# Patient Record
Sex: Female | Born: 1945
Health system: Southern US, Community
[De-identification: ages and names within clinical notes are randomized; demographics above are authoritative.]

## PROBLEM LIST (undated history)

## (undated) DIAGNOSIS — K219 Gastro-esophageal reflux disease without esophagitis: Secondary | ICD-10-CM

## (undated) DIAGNOSIS — G20 Parkinson's disease: Secondary | ICD-10-CM

## (undated) DIAGNOSIS — I252 Old myocardial infarction: Secondary | ICD-10-CM

## (undated) DIAGNOSIS — J45909 Unspecified asthma, uncomplicated: Secondary | ICD-10-CM

## (undated) DIAGNOSIS — I10 Essential (primary) hypertension: Secondary | ICD-10-CM

## (undated) DIAGNOSIS — M1990 Unspecified osteoarthritis, unspecified site: Secondary | ICD-10-CM

## (undated) DIAGNOSIS — S72009A Fracture of unspecified part of neck of unspecified femur, initial encounter for closed fracture: Secondary | ICD-10-CM

## (undated) DIAGNOSIS — J302 Other seasonal allergic rhinitis: Secondary | ICD-10-CM

## (undated) DIAGNOSIS — Z8619 Personal history of other infectious and parasitic diseases: Secondary | ICD-10-CM

## (undated) DIAGNOSIS — J439 Emphysema, unspecified: Secondary | ICD-10-CM

## (undated) DIAGNOSIS — Z8673 Personal history of transient ischemic attack (TIA), and cerebral infarction without residual deficits: Secondary | ICD-10-CM

## (undated) DIAGNOSIS — R6521 Severe sepsis with septic shock: Secondary | ICD-10-CM

## (undated) DIAGNOSIS — N390 Urinary tract infection, site not specified: Secondary | ICD-10-CM

## (undated) DIAGNOSIS — A419 Sepsis, unspecified organism: Secondary | ICD-10-CM

## (undated) DIAGNOSIS — K5090 Crohn's disease, unspecified, without complications: Secondary | ICD-10-CM

## (undated) DIAGNOSIS — E7800 Pure hypercholesterolemia, unspecified: Secondary | ICD-10-CM

## (undated) DIAGNOSIS — J189 Pneumonia, unspecified organism: Secondary | ICD-10-CM

## (undated) DIAGNOSIS — G20A1 Parkinson's disease without dyskinesia, without mention of fluctuations: Secondary | ICD-10-CM

## (undated) HISTORY — DX: Personal history of other infectious and parasitic diseases: Z86.19

## (undated) HISTORY — DX: Urinary tract infection, site not specified: N39.0

## (undated) HISTORY — PX: CATARACT EXTRACTION, BILATERAL: SHX1313

## (undated) HISTORY — DX: Crohn's disease, unspecified, without complications: K50.90

## (undated) HISTORY — PX: WRIST SURGERY: SHX841

## (undated) HISTORY — DX: Severe sepsis with septic shock: R65.21

## (undated) HISTORY — DX: Fracture of unspecified part of neck of unspecified femur, initial encounter for closed fracture: S72.009A

## (undated) HISTORY — DX: Gastro-esophageal reflux disease without esophagitis: K21.9

## (undated) HISTORY — DX: Essential (primary) hypertension: I10

## (undated) HISTORY — DX: Sepsis, unspecified organism: A41.9

## (undated) HISTORY — PX: BREAST BIOPSY: SHX20

## (undated) HISTORY — PX: ELBOW SURGERY: SHX618

## (undated) HISTORY — DX: Unspecified osteoarthritis, unspecified site: M19.90

## (undated) HISTORY — DX: Old myocardial infarction: I25.2

## (undated) HISTORY — DX: Unspecified asthma, uncomplicated: J45.909

## (undated) HISTORY — DX: Other seasonal allergic rhinitis: J30.2

## (undated) HISTORY — PX: OTHER SURGICAL HISTORY: SHX169

## (undated) HISTORY — DX: Emphysema, unspecified: J43.9

## (undated) HISTORY — DX: Personal history of transient ischemic attack (TIA), and cerebral infarction without residual deficits: Z86.73

## (undated) HISTORY — PX: TUBAL LIGATION: SHX77

## (undated) HISTORY — DX: Pure hypercholesterolemia, unspecified: E78.00

---

## 2000-02-19 ENCOUNTER — Ambulatory Visit (HOSPITAL_COMMUNITY): Admission: RE | Admit: 2000-02-19 | Discharge: 2000-02-19 | Payer: Self-pay | Admitting: Gastroenterology

## 2005-08-28 ENCOUNTER — Ambulatory Visit: Payer: Self-pay | Admitting: Family Medicine

## 2005-11-18 ENCOUNTER — Other Ambulatory Visit: Payer: Self-pay

## 2005-11-18 ENCOUNTER — Inpatient Hospital Stay: Payer: Self-pay

## 2005-11-25 ENCOUNTER — Encounter: Payer: Self-pay | Admitting: Family Medicine

## 2005-12-02 ENCOUNTER — Ambulatory Visit: Payer: Self-pay

## 2005-12-17 ENCOUNTER — Encounter: Payer: Self-pay | Admitting: Family Medicine

## 2006-01-14 ENCOUNTER — Encounter: Payer: Self-pay | Admitting: Family Medicine

## 2006-02-14 ENCOUNTER — Encounter: Payer: Self-pay | Admitting: Family Medicine

## 2006-03-16 ENCOUNTER — Encounter: Payer: Self-pay | Admitting: Family Medicine

## 2006-04-16 ENCOUNTER — Encounter: Payer: Self-pay | Admitting: Family Medicine

## 2006-05-05 ENCOUNTER — Ambulatory Visit: Payer: Self-pay | Admitting: Unknown Physician Specialty

## 2006-05-16 ENCOUNTER — Encounter: Payer: Self-pay | Admitting: Family Medicine

## 2006-06-16 ENCOUNTER — Encounter: Payer: Self-pay | Admitting: Family Medicine

## 2006-07-17 ENCOUNTER — Encounter: Payer: Self-pay | Admitting: Family Medicine

## 2006-08-16 ENCOUNTER — Encounter: Payer: Self-pay | Admitting: Family Medicine

## 2008-01-10 ENCOUNTER — Ambulatory Visit: Payer: Self-pay | Admitting: Family Medicine

## 2008-07-20 ENCOUNTER — Other Ambulatory Visit: Payer: Self-pay

## 2008-07-20 ENCOUNTER — Inpatient Hospital Stay: Payer: Self-pay | Admitting: Internal Medicine

## 2009-01-10 ENCOUNTER — Ambulatory Visit: Payer: Self-pay | Admitting: Family Medicine

## 2009-04-26 ENCOUNTER — Emergency Department: Payer: Self-pay | Admitting: Emergency Medicine

## 2009-05-08 ENCOUNTER — Ambulatory Visit: Payer: Self-pay | Admitting: Internal Medicine

## 2009-11-12 ENCOUNTER — Ambulatory Visit: Payer: Self-pay | Admitting: Family Medicine

## 2009-12-05 ENCOUNTER — Ambulatory Visit: Payer: Self-pay | Admitting: Orthopedic Surgery

## 2009-12-06 ENCOUNTER — Ambulatory Visit: Payer: Self-pay | Admitting: Orthopedic Surgery

## 2010-01-17 ENCOUNTER — Ambulatory Visit: Payer: Self-pay | Admitting: Family Medicine

## 2010-04-21 ENCOUNTER — Ambulatory Visit: Payer: Self-pay | Admitting: Gastroenterology

## 2010-05-27 ENCOUNTER — Ambulatory Visit: Payer: Self-pay | Admitting: Gastroenterology

## 2011-01-30 ENCOUNTER — Ambulatory Visit: Payer: Self-pay | Admitting: Family Medicine

## 2011-12-05 ENCOUNTER — Inpatient Hospital Stay: Payer: Self-pay | Admitting: Internal Medicine

## 2011-12-05 LAB — TROPONIN I: Troponin-I: 0.11 ng/mL — ABNORMAL HIGH

## 2011-12-05 LAB — CK-MB: CK-MB: 1.9 ng/mL (ref 0.5–3.6)

## 2011-12-05 LAB — COMPREHENSIVE METABOLIC PANEL
Albumin: 4.2 g/dL (ref 3.4–5.0)
Chloride: 103 mmol/L (ref 98–107)
Co2: 24 mmol/L (ref 21–32)
Creatinine: 0.75 mg/dL (ref 0.60–1.30)
EGFR (African American): >60
EGFR (Non-African Amer.): >60
Potassium: 4.1 mmol/L (ref 3.5–5.1)
SGOT(AST): 67 U/L — ABNORMAL HIGH (ref 15–37)
SGPT (ALT): 63 U/L
Sodium: 140 mmol/L (ref 136–145)

## 2011-12-05 LAB — APTT: Activated PTT: 90.2 secs — ABNORMAL HIGH (ref 23.6–35.9)

## 2011-12-05 LAB — CBC
HCT: 35.5 % (ref 35.0–47.0)
MCH: 32.1 pg (ref 26.0–34.0)
MCV: 96 fL (ref 80–100)
RBC: 3.72 10*6/uL — ABNORMAL LOW (ref 3.80–5.20)
WBC: 9.1 10*3/uL (ref 3.6–11.0)

## 2011-12-05 LAB — PRO B NATRIURETIC PEPTIDE: B-Type Natriuretic Peptide: 512 pg/mL — ABNORMAL HIGH (ref 0–125)

## 2011-12-05 LAB — RAPID INFLUENZA A&B ANTIGENS

## 2011-12-06 LAB — CBC WITH DIFFERENTIAL/PLATELET
Basophil %: 0.3 %
Eosinophil %: 0.5 %
HCT: 33.5 % — ABNORMAL LOW (ref 35.0–47.0)
HGB: 11.3 g/dL — ABNORMAL LOW (ref 12.0–16.0)
Lymphocyte #: 1 10*3/uL (ref 1.0–3.6)
MCH: 32 pg (ref 26.0–34.0)
MCHC: 33.8 g/dL (ref 32.0–36.0)
MCV: 95 fL (ref 80–100)
Monocyte %: 1.6 %
Neutrophil #: 5.7 10*3/uL (ref 1.4–6.5)
Neutrophil %: 83 %
RBC: 3.54 10*6/uL — ABNORMAL LOW (ref 3.80–5.20)
RDW: 13.4 % (ref 11.5–14.5)

## 2011-12-06 LAB — BASIC METABOLIC PANEL
BUN: 10 mg/dL (ref 7–18)
Chloride: 102 mmol/L (ref 98–107)
Osmolality: 285 (ref 275–301)
Potassium: 3.6 mmol/L (ref 3.5–5.1)
Sodium: 142 mmol/L (ref 136–145)

## 2011-12-06 LAB — CK TOTAL AND CKMB (NOT AT ARMC)
CK, Total: 70 U/L (ref 21–215)
CK-MB: 1.8 ng/mL (ref 0.5–3.6)

## 2011-12-07 LAB — LIPID PANEL
Cholesterol: 133 mg/dL (ref 0–200)
HDL Cholesterol: 56 mg/dL (ref 40–60)
Triglycerides: 71 mg/dL (ref 0–200)
VLDL Cholesterol, Calc: 14 mg/dL (ref 5–40)

## 2011-12-08 LAB — APTT: Activated PTT: 23.9 secs (ref 23.6–35.9)

## 2011-12-08 LAB — PLATELET COUNT: Platelet: 155 10*3/uL (ref 150–440)

## 2012-01-21 ENCOUNTER — Ambulatory Visit: Payer: Self-pay | Admitting: Family Medicine

## 2012-02-17 ENCOUNTER — Ambulatory Visit: Payer: Self-pay | Admitting: Family Medicine

## 2013-02-17 ENCOUNTER — Ambulatory Visit: Payer: Self-pay | Admitting: Family Medicine

## 2014-01-15 ENCOUNTER — Other Ambulatory Visit: Payer: Self-pay | Admitting: Physician Assistant

## 2014-01-15 ENCOUNTER — Emergency Department: Payer: Self-pay | Admitting: Emergency Medicine

## 2014-01-15 LAB — CBC
HCT: 41.5 % (ref 35.0–47.0)
HGB: 14.1 g/dL (ref 12.0–16.0)
MCH: 32.1 pg (ref 26.0–34.0)
MCHC: 34 g/dL (ref 32.0–36.0)
MCV: 95 fL (ref 80–100)
PLATELETS: 216 10*3/uL (ref 150–440)
RBC: 4.4 10*6/uL (ref 3.80–5.20)
RDW: 13.8 % (ref 11.5–14.5)
WBC: 18.8 10*3/uL — ABNORMAL HIGH (ref 3.6–11.0)

## 2014-01-15 LAB — BASIC METABOLIC PANEL
Anion Gap: 10 (ref 7–16)
BUN: 12 mg/dL (ref 7–18)
CALCIUM: 9.1 mg/dL (ref 8.5–10.1)
CO2: 26 mmol/L (ref 21–32)
Chloride: 97 mmol/L — ABNORMAL LOW (ref 98–107)
Creatinine: 0.99 mg/dL (ref 0.60–1.30)
EGFR (Non-African Amer.): 60 — ABNORMAL LOW
Glucose: 166 mg/dL — ABNORMAL HIGH (ref 65–99)
Osmolality: 270 (ref 275–301)
POTASSIUM: 3.8 mmol/L (ref 3.5–5.1)
Sodium: 133 mmol/L — ABNORMAL LOW (ref 136–145)

## 2014-01-15 LAB — TROPONIN I: Troponin-I: <0.02 ng/mL

## 2014-02-20 ENCOUNTER — Ambulatory Visit: Payer: Self-pay | Admitting: Family Medicine

## 2014-04-26 ENCOUNTER — Other Ambulatory Visit: Payer: Self-pay | Admitting: Family Medicine

## 2014-04-26 LAB — CBC WITH DIFFERENTIAL/PLATELET
Bands: 2 %
COMMENT - H1-COM1: NORMAL
Comment - H1-Com2: NORMAL
HCT: 39 % (ref 35.0–47.0)
HGB: 12.5 g/dL (ref 12.0–16.0)
LYMPHS PCT: 7 %
MCH: 30.1 pg (ref 26.0–34.0)
MCHC: 32.2 g/dL (ref 32.0–36.0)
MCV: 94 fL (ref 80–100)
MONOS PCT: 2 %
Metamyelocyte: 1 %
Myelocyte: 3 %
PLATELETS: 238 10*3/uL (ref 150–440)
RBC: 4.17 10*6/uL (ref 3.80–5.20)
RDW: 13.2 % (ref 11.5–14.5)
Segmented Neutrophils: 85 %
WBC: 23.4 10*3/uL — AB (ref 3.6–11.0)

## 2014-04-26 LAB — COMPREHENSIVE METABOLIC PANEL
Albumin: 3.6 g/dL (ref 3.4–5.0)
Alkaline Phosphatase: 76 U/L
Anion Gap: 6 — ABNORMAL LOW (ref 7–16)
BILIRUBIN TOTAL: 0.7 mg/dL (ref 0.2–1.0)
BUN: 14 mg/dL (ref 7–18)
CHLORIDE: 102 mmol/L (ref 98–107)
CO2: 26 mmol/L (ref 21–32)
Calcium, Total: 9 mg/dL (ref 8.5–10.1)
Creatinine: 0.91 mg/dL (ref 0.60–1.30)
EGFR (Non-African Amer.): >60
Glucose: 112 mg/dL — ABNORMAL HIGH (ref 65–99)
Osmolality: 269 (ref 275–301)
POTASSIUM: 4.4 mmol/L (ref 3.5–5.1)
SGOT(AST): 27 U/L (ref 15–37)
SGPT (ALT): 37 U/L (ref 12–78)
Sodium: 134 mmol/L — ABNORMAL LOW (ref 136–145)
Total Protein: 7.5 g/dL (ref 6.4–8.2)

## 2014-04-26 LAB — PROTIME-INR
INR: 1
Prothrombin Time: 12.9 secs (ref 11.5–14.7)

## 2014-06-06 ENCOUNTER — Ambulatory Visit: Payer: Self-pay | Admitting: Family Medicine

## 2014-06-13 ENCOUNTER — Ambulatory Visit: Payer: Self-pay | Admitting: Family Medicine

## 2014-06-15 ENCOUNTER — Ambulatory Visit: Payer: Self-pay | Admitting: Oncology

## 2014-06-15 LAB — CBC CANCER CENTER
Basophil #: 0 x10 3/mm (ref 0.0–0.1)
Basophil %: 0.2 %
EOS PCT: 1.4 %
Eosinophil #: 0.2 x10 3/mm (ref 0.0–0.7)
HCT: 38.3 % (ref 35.0–47.0)
HGB: 12.5 g/dL (ref 12.0–16.0)
Lymphocyte #: 1.2 x10 3/mm (ref 1.0–3.6)
Lymphocyte %: 7.6 %
MCH: 30.5 pg (ref 26.0–34.0)
MCHC: 32.7 g/dL (ref 32.0–36.0)
MCV: 93 fL (ref 80–100)
Monocyte #: 0.6 x10 3/mm (ref 0.2–0.9)
Monocyte %: 3.6 %
NEUTROS ABS: 13.6 x10 3/mm — AB (ref 1.4–6.5)
Neutrophil %: 87.2 %
Platelet: 265 x10 3/mm (ref 150–440)
RBC: 4.1 10*6/uL (ref 3.80–5.20)
RDW: 14 % (ref 11.5–14.5)
WBC: 15.6 x10 3/mm — AB (ref 3.6–11.0)

## 2014-06-15 LAB — LACTATE DEHYDROGENASE: LDH: 208 U/L (ref 81–246)

## 2014-06-16 ENCOUNTER — Ambulatory Visit: Payer: Self-pay | Admitting: Oncology

## 2014-08-01 ENCOUNTER — Encounter: Payer: Self-pay | Admitting: Pulmonary Disease

## 2014-08-01 ENCOUNTER — Ambulatory Visit (INDEPENDENT_AMBULATORY_CARE_PROVIDER_SITE_OTHER): Payer: Medicare HMO | Admitting: Pulmonary Disease

## 2014-08-01 VITALS — BP 124/68 | HR 78 | Ht 63.0 in | Wt 143.0 lb

## 2014-08-01 DIAGNOSIS — R5383 Other fatigue: Principal | ICD-10-CM

## 2014-08-01 DIAGNOSIS — J441 Chronic obstructive pulmonary disease with (acute) exacerbation: Secondary | ICD-10-CM

## 2014-08-01 DIAGNOSIS — K2100 Gastro-esophageal reflux disease with esophagitis, without bleeding: Secondary | ICD-10-CM

## 2014-08-01 DIAGNOSIS — K219 Gastro-esophageal reflux disease without esophagitis: Secondary | ICD-10-CM | POA: Insufficient documentation

## 2014-08-01 DIAGNOSIS — R059 Cough, unspecified: Secondary | ICD-10-CM

## 2014-08-01 DIAGNOSIS — J449 Chronic obstructive pulmonary disease, unspecified: Secondary | ICD-10-CM | POA: Insufficient documentation

## 2014-08-01 DIAGNOSIS — R05 Cough: Secondary | ICD-10-CM

## 2014-08-01 DIAGNOSIS — R5381 Other malaise: Secondary | ICD-10-CM

## 2014-08-01 MED ORDER — AEROCHAMBER MV MISC: Status: DC

## 2014-08-01 NOTE — Patient Instructions (Signed)
We will request records from Dr. Joanell Rising office Take your symbicort twice a day with a spacer Keep using your spiriva Come get blood work next Monday Provide [place] with a sample of your mucus Follow the GERD diet we gave you   We will see you back in 6 weeks or sooner if needed

## 2014-08-01 NOTE — Progress Notes (Signed)
Subjective:    Patient ID: Lori Brady, female    DOB: [birth year], 68 y.o.   MRN: [id number]  HPI  This is a very 68 year old female who comes to my clinic today for evaluation of recurrent exacerbations of COPD. She smoked at least one pack of cigarettes daily up until age 18. She started smoking in her 49s.  She has been taking inhalers for the last several years for COPD and typically would have about one episode of bronchitis per year. However, for the last 6 months she has been experiencing significant flares of COPD. She has been on prednisone near constantly for the last 3 months. She says that she has also been treated with multiple rounds of antibiotics. She has been taking Symbicort twice a day as well as Spiriva during this time. She says that this is a markedly different from her baseline. She also notes that her shortness of breath has increased somewhat to the point to where she is still capable of performing her activities of daily living but she is a bit more short of breath than normal. However, when she is on prednisone her symptoms are nearly completely resolved. She is currently on a prednisone taper and is feeling well right now. However, she says typically whenever she comes off the prednisone she starts to have significant increasing chest tightness, wheezing, and thick mucus production. She describes the mucus is thick, brown to green and purulent.  Past Medical History  Diagnosis Date  . Asthma   . Arthritis   . History of chicken pox   . Emphysema of lung   . GERD (gastroesophageal reflux disease)   . Seasonal allergies   . Hypertension   . Hypercholesteremia   . Hx of completed stroke   . History of heart attack   . UTI (lower urinary tract infection)      Family History  Problem Relation Age of Onset  . Cancer Sister     ovarian/uterine/breast  . Cancer Sister     breast  . Heart disease Father   . Heart disease Brother     2 brothers  . Heart disease  Sister   . Diabetes Maternal Grandmother      History   Social History  . Marital Status: Widowed    Spouse Name: N/A    Number of Children: N/A  . Years of Education: N/A   Occupational History  . Not on file.   Social History Main Topics  . Smoking status: Former Smoker -- 1.00 packs/day for 30 years    Types: Cigarettes    Quit date: 08/01/2008  . Smokeless tobacco: Never Used  . Alcohol Use: Yes     Comment: occasional  . Drug Use: Not on file  . Sexual Activity: Not on file   Other Topics Concern  . Not on file   Social History Narrative  . No narrative on file     Allergies  Allergen Reactions  . Vicodin [Hydrocodone-Acetaminophen]     nausea     No outpatient prescriptions prior to visit.   No facility-administered medications prior to visit.      Review of Systems  Constitutional: Positive for fatigue. Negative for fever and unexpected weight change.  HENT: Positive for congestion and voice change. Negative for dental problem, ear pain, nosebleeds, postnasal drip, rhinorrhea, sinus pressure, sneezing, sore throat and trouble swallowing.   Eyes: Negative for redness and itching.  Respiratory: Positive for cough and shortness of breath.  Negative for chest tightness and wheezing.   Cardiovascular: Negative for palpitations and leg swelling.  Gastrointestinal: Negative for nausea and vomiting.  Genitourinary: Negative for dysuria.  Musculoskeletal: Negative for joint swelling.  Skin: Negative for rash.  Neurological: Negative for headaches.  Hematological: Does not bruise/bleed easily.  Psychiatric/Behavioral: Negative for dysphoric mood. The patient is not nervous/anxious.        Objective:   Physical Exam  Filed Vitals:   08/01/14 1152  BP: 124/68  Pulse: 78  Height: 5' 3"  (1.6 m)  Weight: 143 lb (64.864 kg)  SpO2: 100%   RA  Gen: well appearing, no acute distress HEENT: NCAT, PERRL, EOMi, OP clear, neck supple without masses PULM: CTA  B CV: RRR, no mgr, no JVD AB: BS+, soft, nontender, no hsm Ext: warm, no edema, no clubbing, no cyanosis Derm: no rash or skin breakdown Neuro: A&Ox4, CN II-XII intact, strength 5/5 in all 4 extremities  06/13/2014 CT chest reviewed> there is mild emphysema bilaterally, there is no evidence of a pulmonary parenchymal abnormality. There is a healed broken rib on the right. Normal heart size     Assessment & Plan:   COPD, severe Krystiana is experiencing severe COPD with frequent exacerbations. It sounds like she has "fallen off the cliff" in that she was doing quite well up until the last few months. This typically makes me think of an allergic reaction, an atypical infection such as mycobacterial infections or allergic bronchopulmonary aspergillosis. There is nothing on her CT scan to suggest either of these etiologies or another pulmonary process.  Plan: -Serum IgE -Sputum culture for bacteria, fungus, AFB -Add spacer to Symbicort -Flu shot today -Continue Spiriva -Followup 6 weeks after AFB cultures back   Acid reflux This is contributing significantly to her cough and mucus in her throat.  Plan: -I advised her to use her Nexium daily rather than as needed -We discussed gastroesophageal reflux disease lifestyle modifications and she was provided with literature on this  Cough Consider stopping lisinopril if no improvement with acid reflux treatment.    Updated Medication List Outpatient Encounter Prescriptions as of 08/01/2014  Medication Sig  . albuterol (PROVENTIL HFA;VENTOLIN HFA) 108 (90 BASE) MCG/ACT inhaler Inhale 2 puffs into the lungs every 6 (six) hours as needed for wheezing or shortness of breath.  . ALPRAZolam (XANAX) 0.5 MG tablet Take 0.5 mg by mouth at bedtime as needed for anxiety.  Marland Kitchen aspirin 81 MG tablet Take 81 mg by mouth daily.  . budesonide-formoterol (SYMBICORT) 160-4.5 MCG/ACT inhaler Inhale 2 puffs into the lungs 2 (two) times daily.  . chlorthalidone  (HYGROTON) 25 MG tablet Take 25 mg by mouth daily.  . Cholecalciferol (VITAMIN D) 2000 UNITS CAPS Take 1 capsule by mouth daily.  Marland Kitchen lisinopril (PRINIVIL,ZESTRIL) 10 MG tablet Take 10 mg by mouth daily.  . montelukast (SINGULAIR) 10 MG tablet Take 10 mg by mouth at bedtime.  . Omega-3 Fatty Acids (FISH OIL) 1000 MG CAPS Take 1 capsule by mouth daily.  . potassium chloride (KLOR-CON) 20 MEQ packet Take 20 mEq by mouth 2 (two) times daily.  . simvastatin (ZOCOR) 40 MG tablet Take 40 mg by mouth daily.  Marland Kitchen tiotropium (SPIRIVA) 18 MCG inhalation capsule Place 18 mcg into inhaler and inhale daily.  Marland Kitchen Spacer/Aero-Holding Chambers (AEROCHAMBER MV) inhaler Use as instructed with inhaler.  DX 496.

## 2014-08-01 NOTE — Assessment & Plan Note (Signed)
Lori Brady is experiencing severe COPD with frequent exacerbations. It sounds like she has "fallen off the cliff" in that she was doing quite well up until the last few months. This typically makes me think of an allergic reaction, an atypical infection such as mycobacterial infections or allergic bronchopulmonary aspergillosis. There is nothing on her CT scan to suggest either of these etiologies or another pulmonary process.  Plan: -Serum IgE -Sputum culture for bacteria, fungus, AFB -Add spacer to Symbicort -Flu shot today -Continue Spiriva -Followup 6 weeks after AFB cultures back

## 2014-08-01 NOTE — Assessment & Plan Note (Signed)
This is contributing significantly to her cough and mucus in her throat.  Plan: -I advised her to use her Nexium daily rather than as needed -We discussed gastroesophageal reflux disease lifestyle modifications and she was provided with literature on this

## 2014-08-01 NOTE — Assessment & Plan Note (Signed)
Consider stopping lisinopril if no improvement with acid reflux treatment.

## 2014-08-02 ENCOUNTER — Encounter: Payer: Self-pay | Admitting: Pulmonary Disease

## 2014-08-02 NOTE — Addendum Note (Signed)
Addended by: Velvet Bathe on: 08/02/2014 08:28 AM   Modules accepted: Orders

## 2014-08-06 ENCOUNTER — Other Ambulatory Visit: Payer: Self-pay | Admitting: Pulmonary Disease

## 2014-08-06 ENCOUNTER — Other Ambulatory Visit (INDEPENDENT_AMBULATORY_CARE_PROVIDER_SITE_OTHER): Payer: Medicare HMO

## 2014-08-06 DIAGNOSIS — R5383 Other fatigue: Principal | ICD-10-CM

## 2014-08-06 DIAGNOSIS — R5381 Other malaise: Secondary | ICD-10-CM

## 2014-08-06 LAB — CBC WITH DIFFERENTIAL/PLATELET
BASOS PCT: 0.4 % (ref 0.0–3.0)
Basophils Absolute: 0.1 10*3/uL (ref 0.0–0.1)
EOS PCT: 1 % (ref 0.0–5.0)
Eosinophils Absolute: 0.2 10*3/uL (ref 0.0–0.7)
HEMATOCRIT: 38.6 % (ref 36.0–46.0)
HEMOGLOBIN: 13.1 g/dL (ref 12.0–15.0)
Lymphocytes Relative: 16.9 % (ref 12.0–46.0)
Lymphs Abs: 2.5 10*3/uL (ref 0.7–4.0)
MCHC: 33.9 g/dL (ref 30.0–36.0)
MCV: 92.2 fl (ref 78.0–100.0)
MONO ABS: 1.3 10*3/uL — AB (ref 0.1–1.0)
Monocytes Relative: 8.9 % (ref 3.0–12.0)
Neutro Abs: 11 10*3/uL — ABNORMAL HIGH (ref 1.4–7.7)
Neutrophils Relative %: 72.8 % (ref 43.0–77.0)
Platelets: 230 10*3/uL (ref 150.0–400.0)
RBC: 4.19 Mil/uL (ref 3.87–5.11)
RDW: 14.9 % (ref 11.5–15.5)
WBC: 15.1 10*3/uL — ABNORMAL HIGH (ref 4.0–10.5)

## 2014-08-07 LAB — IGE: IgE (Immunoglobulin E), Serum: 625 kU/L — ABNORMAL HIGH (ref <115)

## 2014-08-09 LAB — LOWER RESPIRATORY CULTURE

## 2014-08-11 ENCOUNTER — Encounter: Payer: Self-pay | Admitting: Pulmonary Disease

## 2014-08-12 ENCOUNTER — Encounter: Payer: Self-pay | Admitting: Pulmonary Disease

## 2014-08-30 ENCOUNTER — Telehealth: Payer: Self-pay | Admitting: Pulmonary Disease

## 2014-08-30 MED ORDER — LEVOFLOXACIN 750 MG PO TABS: 750.0000 mg | ORAL_TABLET | Freq: Every day | ORAL | Status: AC

## 2014-08-30 MED ORDER — PREDNISONE 10 MG PO TABS: ORAL_TABLET | ORAL | Status: DC

## 2014-08-30 NOTE — Telephone Encounter (Signed)
Pt c/o increased sob for 2 days. Prod cough (Yellow), used HHN 3 times during the night last night, low grade temp, headaches, pain on right side below breast when lying down.  Pt does not want to come to [place] to be seen.  Please advise.  Allergies  Allergen Reactions  . Vicodin [Hydrocodone-Acetaminophen]     nausea    Current Outpatient Prescriptions on File Prior to Visit  Medication Sig Dispense Refill  . albuterol (PROVENTIL HFA;VENTOLIN HFA) 108 (90 BASE) MCG/ACT inhaler Inhale 2 puffs into the lungs every 6 (six) hours as needed for wheezing or shortness of breath.      . ALPRAZolam (XANAX) 0.5 MG tablet Take 0.5 mg by mouth at bedtime as needed for anxiety.      Marland Kitchen aspirin 81 MG tablet Take 81 mg by mouth daily.      . budesonide-formoterol (SYMBICORT) 160-4.5 MCG/ACT inhaler Inhale 2 puffs into the lungs 2 (two) times daily.      . chlorthalidone (HYGROTON) 25 MG tablet Take 25 mg by mouth daily.      . Cholecalciferol (VITAMIN D) 2000 UNITS CAPS Take 1 capsule by mouth daily.      Marland Kitchen lisinopril (PRINIVIL,ZESTRIL) 10 MG tablet Take 10 mg by mouth daily.      . montelukast (SINGULAIR) 10 MG tablet Take 10 mg by mouth at bedtime.      . Omega-3 Fatty Acids (FISH OIL) 1000 MG CAPS Take 1 capsule by mouth daily.      . potassium chloride (KLOR-CON) 20 MEQ packet Take 20 mEq by mouth 2 (two) times daily.      . simvastatin (ZOCOR) 40 MG tablet Take 40 mg by mouth daily.      Marland Kitchen Spacer/Aero-Holding Chambers (AEROCHAMBER MV) inhaler Use as instructed with inhaler.  DX 496.  1 each  0  . tiotropium (SPIRIVA) 18 MCG inhalation capsule Place 18 mcg into inhaler and inhale daily.       No current facility-administered medications on file prior to visit.

## 2014-08-30 NOTE — Telephone Encounter (Signed)
Pt aware of recs. RX sent in. Nothing further needed 

## 2014-08-30 NOTE — Telephone Encounter (Signed)
Levaquin 750 po daily 7 days with probiotic Prednisone: Take 53m po daily for 3 days, then take 364mpo daily for 3 days, then take 2046mo daily for two days, then take 40m5m daily for 2 days

## 2014-09-03 LAB — FUNGUS CULTURE W SMEAR: Smear Result: NONE SEEN

## 2014-09-06 ENCOUNTER — Telehealth: Payer: Self-pay | Admitting: Pulmonary Disease

## 2014-09-06 DIAGNOSIS — E782 Mixed hyperlipidemia: Secondary | ICD-10-CM | POA: Insufficient documentation

## 2014-09-06 NOTE — Telephone Encounter (Signed)
Called spoke with pt. She c/o feeling anxious, dizzy, feeling off balance, feeling confused. She saw cards and was told it could be the prednisone. Pt has 1 day left of the prednisone taper from 08/30/14. I advised pt to call her PCP to see if she can get in to see her PCP to be evaluated. She is requesting further recommendation from Dr. Lake Bells bc she is afraid her COPD may flare up again once she does come off the prednisone. Please advise thanks  Allergies  Allergen Reactions  . Vicodin [Hydrocodone-Acetaminophen]     nausea

## 2014-09-06 NOTE — Telephone Encounter (Signed)
Dr. Kendrick Fries just FYI.

## 2014-09-06 NOTE — Telephone Encounter (Signed)
Patient states she called and got an appt with primary care doctor for Monday 10/26 @ 11:00.  [phone]

## 2014-09-13 ENCOUNTER — Ambulatory Visit: Payer: Self-pay | Admitting: Oncology

## 2014-09-13 LAB — CBC CANCER CENTER
BASOS PCT: 0.4 %
Basophil #: 0.1 x10 3/mm (ref 0.0–0.1)
EOS PCT: 2.4 %
Eosinophil #: 0.4 x10 3/mm (ref 0.0–0.7)
HCT: 36.6 % (ref 35.0–47.0)
HGB: 12 g/dL (ref 12.0–16.0)
LYMPHS ABS: 2.1 x10 3/mm (ref 1.0–3.6)
Lymphocyte %: 13.6 %
MCH: 30.8 pg (ref 26.0–34.0)
MCHC: 32.9 g/dL (ref 32.0–36.0)
MCV: 94 fL (ref 80–100)
MONOS PCT: 7 %
Monocyte #: 1.1 x10 3/mm — ABNORMAL HIGH (ref 0.2–0.9)
Neutrophil #: 12.1 x10 3/mm — ABNORMAL HIGH (ref 1.4–6.5)
Neutrophil %: 76.6 %
PLATELETS: 172 x10 3/mm (ref 150–440)
RBC: 3.9 10*6/uL (ref 3.80–5.20)
RDW: 14 % (ref 11.5–14.5)
WBC: 15.8 x10 3/mm — ABNORMAL HIGH (ref 3.6–11.0)

## 2014-09-16 ENCOUNTER — Ambulatory Visit: Payer: Self-pay | Admitting: Oncology

## 2014-09-18 LAB — AFB CULTURE WITH SMEAR (NOT AT ARMC): Acid Fast Smear: NONE SEEN

## 2014-09-26 ENCOUNTER — Encounter (HOSPITAL_BASED_OUTPATIENT_CLINIC_OR_DEPARTMENT_OTHER): Payer: Medicare HMO

## 2014-10-01 ENCOUNTER — Telehealth: Payer: Self-pay | Admitting: Pulmonary Disease

## 2014-10-01 ENCOUNTER — Other Ambulatory Visit: Payer: Self-pay

## 2014-10-01 ENCOUNTER — Ambulatory Visit (INDEPENDENT_AMBULATORY_CARE_PROVIDER_SITE_OTHER): Payer: Medicare HMO | Admitting: Pulmonary Disease

## 2014-10-01 ENCOUNTER — Encounter: Payer: Self-pay | Admitting: Pulmonary Disease

## 2014-10-01 VITALS — BP 114/66 | HR 108 | Temp 98.3°F | Ht 63.0 in | Wt 139.0 lb

## 2014-10-01 DIAGNOSIS — J449 Chronic obstructive pulmonary disease, unspecified: Secondary | ICD-10-CM

## 2014-10-01 DIAGNOSIS — R05 Cough: Secondary | ICD-10-CM

## 2014-10-01 DIAGNOSIS — R059 Cough, unspecified: Secondary | ICD-10-CM

## 2014-10-01 DIAGNOSIS — J441 Chronic obstructive pulmonary disease with (acute) exacerbation: Secondary | ICD-10-CM

## 2014-10-01 MED ORDER — LEVOFLOXACIN 500 MG PO TABS: 500.0000 mg | ORAL_TABLET | Freq: Every day | ORAL | Status: AC

## 2014-10-01 MED ORDER — ZOLPIDEM TARTRATE ER 12.5 MG PO TBCR: 12.5000 mg | EXTENDED_RELEASE_TABLET | Freq: Every evening | ORAL | Status: DC | PRN

## 2014-10-01 MED ORDER — PREDNISONE 10 MG PO TABS: ORAL_TABLET | ORAL | Status: DC

## 2014-10-01 NOTE — Telephone Encounter (Signed)
Pharmacy calling w/questions a/b prescript please call them back @ [phone].Hillery Hunter

## 2014-10-01 NOTE — Patient Instructions (Signed)
Take the prednisone taper as written Take the levaquin with a probiotic (yogurt) for a week We will refer you back to the [facility] folks for your COPD and allergy We will see you back in January or sooner if needed

## 2014-10-01 NOTE — Telephone Encounter (Signed)
Pt calling concerned a/b prescript for predinsone please advise.Hillery Hunter

## 2014-10-01 NOTE — Progress Notes (Signed)
Subjective:    Patient ID: Lori Brady, female    DOB: 1946-03-10, 68 y.o.   MRN: [id number]  Synopsis: Brynnly Bonet has COPD and was referred in 2015 for the same.  She had a high IgE level (625) and a sputum culture was negative for AFB or fungal organisms but she grew pan sensitive pseudomonas.  01/2014 hemoglobin 13.7 03/2014 Sprio> Ratio 60%, FEV1 1.29L (61% pred)   HPI Chief Complaint  Patient presents with  . Acute Visit    Pt c/o worsening sob since Saturday.  Pt having sob, wheezing, some prod cough with yellow mucus.     10/01/2014 ROV> Moxie says that she was doing [place] up until Saturday.  She started developing abdominal fullness and worsening dyspnea, wheezing and cough.  Her dyspnea has progressed since then.  She has been taking all of her medications regularly.  She has been wheezing more and coughing up yellow mucus.  She took the levaquin and prednisone we gave her helped a lot.  She had some folks come out to service her [facility] Unit and she looked at her unit and it showed a lot of mold. This was the first time this was seen (she has her unit serviced regularly).    Past Medical History  Diagnosis Date  . Asthma   . Arthritis   . History of chicken pox   . Emphysema of lung   . GERD (gastroesophageal reflux disease)   . Seasonal allergies   . Hypertension   . Hypercholesteremia   . Hx of completed stroke   . History of heart attack   . UTI (lower urinary tract infection)      Review of Systems  Constitutional: Negative for chills, fatigue and unexpected weight change.  HENT: Negative for postnasal drip, rhinorrhea and sinus pressure.   Respiratory: Positive for cough, shortness of breath and wheezing.   Cardiovascular: Negative for chest pain, palpitations and leg swelling.       Objective:   Physical Exam Filed Vitals:   10/01/14 1037  BP: 114/66  Pulse: 108  Temp: 98.3 F (36.8 C)  TempSrc: Oral  Height: 5' 3"  (1.6 m)  Weight: 139 lb (63.05 kg)  SpO2:  95%   RA  Gen: well appearing, no acute distress HEENT: NCAT, EOMi, OP clear PULM: Wheezing, no crackles CV: RRR, no mgr, no JVD AB: BS+, soft, nontender, Ext: warm, no edema, no clubbing, no cyanosis Derm: no rash or skin breakdown Neuro: A&Ox4, MAEW        Assessment & Plan:   COPD, severe I'm concerned about the discovery of black mold in her home [facility] unit.  Her sputum culture did not grow out mold but her serum IgE is high indicative of a possible allergy contributing to her recurrent exacerbations.  Though her imaging (CT chest) is not indicative of ABPA, but I worry about it given the decline in the last 6 months.  Plan: -check Aspergillus specific IgE -refer to  Asthma and Allergy due to high IgE, mold exposure, and recurrent COPD exacerbations -continue symbicort and spiriva -f/u January 2016  COPD exacerbation She is having yet another exacerbation of COPD.  See discussion above.  Plan: -prednisone taper -levaquin    Updated Medication List Outpatient Encounter Prescriptions as of 10/01/2014  Medication Sig  . albuterol (ACCUNEB) 0.63 MG/3ML nebulizer solution Take 1 ampule by nebulization every 6 (six) hours as needed for wheezing.  Marland Kitchen albuterol (PROVENTIL HFA;VENTOLIN HFA) 108 (90 BASE) MCG/ACT  inhaler Inhale 2 puffs into the lungs every 6 (six) hours as needed for wheezing or shortness of breath.  . ALPRAZolam (XANAX) 0.5 MG tablet Take 0.5 mg by mouth at bedtime as needed for anxiety.  Marland Kitchen aspirin 81 MG tablet Take 81 mg by mouth daily.  . budesonide-formoterol (SYMBICORT) 160-4.5 MCG/ACT inhaler Inhale 2 puffs into the lungs 2 (two) times daily.  . chlorthalidone (HYGROTON) 25 MG tablet Take 25 mg by mouth daily.  . Cholecalciferol (VITAMIN D) 2000 UNITS CAPS Take 1 capsule by mouth daily.  Marland Kitchen lisinopril (PRINIVIL,ZESTRIL) 10 MG tablet Take 10 mg by mouth daily.  . montelukast (SINGULAIR) 10 MG tablet Take 10 mg by mouth at bedtime.  . Omega-3 Fatty  Acids (FISH OIL) 1000 MG CAPS Take 1 capsule by mouth daily.  . potassium chloride (KLOR-CON) 20 MEQ packet Take 20 mEq by mouth 2 (two) times daily.  . simvastatin (ZOCOR) 40 MG tablet Take 40 mg by mouth daily.  Marland Kitchen Spacer/Aero-Holding Chambers (AEROCHAMBER MV) inhaler Use as instructed with inhaler.  DX 496.  . tiotropium (SPIRIVA) 18 MCG inhalation capsule Place 18 mcg into inhaler and inhale daily.  . [DISCONTINUED] predniSONE (DELTASONE) 10 MG tablet Take 78m po daily for 3 days, then take 348mpo daily for 3 days, then take 2032mo daily for two days, then take 35m43m daily for 2 days

## 2014-10-01 NOTE — Telephone Encounter (Signed)
Called and spoke to [place], Teacher, early years/pre at [employer]. Brooke needing to clarify the pred taper that was sent in today. Lennox Laity at the [place] office and she confirmed the pred taper. Called [facility] and spoke with Nehemiah Settle again and confirmed the pres taper. Brooke verbalized understanding and denied any further questions or concerns at this time.

## 2014-10-01 NOTE — Assessment & Plan Note (Addendum)
I'm concerned about the discovery of black mold in her home [facility] unit.  Her sputum culture did not grow out mold but her serum IgE is high indicative of a possible allergy contributing to her recurrent exacerbations.  Though her imaging (CT chest) is not indicative of ABPA, but I worry about it given the decline in the last 6 months.  Plan: -check Aspergillus specific IgE -refer to [facility] due to high IgE, mold exposure, and recurrent COPD exacerbations -continue symbicort and spiriva -f/u January 2016

## 2014-10-01 NOTE — Assessment & Plan Note (Signed)
She is having yet another exacerbation of COPD.  See discussion above.  Plan: -prednisone taper -levaquin

## 2014-10-03 ENCOUNTER — Ambulatory Visit: Payer: Medicare HMO | Admitting: Pulmonary Disease

## 2014-10-04 LAB — ASPERGILLUS IGE PANEL
Aspergillus Amstel/glauc IgE: <0.35 kU/L (ref <0.35)
Aspergillus Terreus IgE: <0.35 kU/L (ref <0.35)
Aspergillus Versicolor IgE: <0.1 kU/L (ref <0.35)
CLASS: 0
CLASS: 0
CLASS: 0
Class: 0
Class: 0
Class: 0
Class: 0
Results Received-AspIgE: <0.1 kU/L (ref <0.35)

## 2014-10-22 ENCOUNTER — Telehealth: Payer: Self-pay | Admitting: Pulmonary Disease

## 2014-10-22 MED ORDER — PREDNISONE 10 MG PO TABS: ORAL_TABLET | ORAL | Status: DC

## 2014-10-22 NOTE — Telephone Encounter (Signed)
Pt states she needs her Prednisone taper refilled again as she is having cough-yellow in color, SOB with wheezing and knows this will get worse if she doesn't start something today. Pt lives in [place] and can not come to [place] office to be seen for acute visit today.   MW please advise as doc of day and BQ is out of office. Thanks.   Allergies  Allergen Reactions  . Vicodin [Hydrocodone-Acetaminophen]     nausea

## 2014-10-22 NOTE — Telephone Encounter (Signed)
Ok Prednisone 10 mg take  4 each am x 2 days,   2 each am x 2 days,  1 each am x 2 days and stop but ov by 10/26/14 if not improving

## 2014-10-22 NOTE — Telephone Encounter (Signed)
Called and spoke to pt. Informed pt of the recs per MW. Rx sent to preferred pharmacy. Pt verbalized understanding and denied any further questions or concerns at this time.   

## 2014-10-31 ENCOUNTER — Telehealth: Payer: Self-pay | Admitting: Pulmonary Disease

## 2014-10-31 MED ORDER — BUDESONIDE-FORMOTEROL FUMARATE 160-4.5 MCG/ACT IN AERO: 2.0000 | INHALATION_SPRAY | Freq: Two times a day (BID) | RESPIRATORY_TRACT | Status: DC

## 2014-10-31 NOTE — Telephone Encounter (Signed)
KC please advise for BQ.  thanks

## 2014-10-31 NOTE — Telephone Encounter (Signed)
Called and spoke with pt and she is aware of appt in the am in [place] to see VM.  symbicort has been sent to her pharmacy.  She did say that she had the prednisone taper 3-4 wks ago but it did not help.  Nothing further is needed.

## 2014-10-31 NOTE — Telephone Encounter (Signed)
Spoke with pt, c/o prod cough with white mucus since yesterday, chest tightness with coughing. No fever, sinus congestion.  Is requesting a pred taper, and also a refill on her symbicort to [facility].  Dr. Lake Bells are you ok with sending in this prednisone?  Thanks!

## 2014-10-31 NOTE — Telephone Encounter (Signed)
Ok to refill symbicort She was just on prednisone taper 3-4 weeks ago.  I think she needs ov to be evaluated.

## 2014-11-01 ENCOUNTER — Ambulatory Visit (INDEPENDENT_AMBULATORY_CARE_PROVIDER_SITE_OTHER): Payer: Medicare HMO | Admitting: Internal Medicine

## 2014-11-01 ENCOUNTER — Encounter: Payer: Self-pay | Admitting: Internal Medicine

## 2014-11-01 VITALS — BP 112/72 | HR 95 | Temp 97.9°F | Ht 63.5 in | Wt 143.4 lb

## 2014-11-01 DIAGNOSIS — J441 Chronic obstructive pulmonary disease with (acute) exacerbation: Secondary | ICD-10-CM

## 2014-11-01 DIAGNOSIS — J449 Chronic obstructive pulmonary disease, unspecified: Secondary | ICD-10-CM

## 2014-11-01 MED ORDER — LEVOFLOXACIN 750 MG PO TABS: 750.0000 mg | ORAL_TABLET | Freq: Every day | ORAL | Status: DC

## 2014-11-01 MED ORDER — PREDNISONE 20 MG PO TABS: ORAL_TABLET | ORAL | Status: DC

## 2014-11-01 NOTE — Assessment & Plan Note (Signed)
  Again as per Dr. Kendrick Fries impression, which I am in agreement with - the discovery of black mold in her home [facility] unit is possibly triggering these episodes, along with a possible anxiety component.  Her sputum culture did not grow out mold but her serum IgE is high indicative of a possible allergy contributing to her recurrent exacerbations. She has taken step for mold removal and keeping the moisture down in and around her [facility] unit.   Though her imaging (CT chest) is not indicative of ABPA, there is concern about ABPA given the decline in the last 6 months.  Plan: - check Aspergillus specific IgE after prednisone taper, hopefully she can stay off prednisone long enough  - she has followed up with [facility], they recommend to continue with inhaler and added nasal saline irrigation, no clear etiology at this time for elevated IgE level - continue symbicort and spiriva - f/u January 2016 with Dr. Kendrick Fries - if she continues to have these exacerbations then may need to consider bronchoscopy with BAL

## 2014-11-01 NOTE — Patient Instructions (Signed)
Levaquin and Prednisone will be sent to your pharmacy. Chest Xray will be done before your next office visit. Follow up with Dr. Kendrick Fries in

## 2014-11-01 NOTE — Progress Notes (Signed)
MRN# [id number] Lori Brady [birth year]   CC: Chief Complaint  Patient presents with  . Acute Visit    pt having sob, cough with white mucus,some wheezing. She also has chest tightness.      Brief History: Synopsis: Lori Brady has COPD and was referred in 2015 for the same. She had a high IgE level (625) and a sputum culture was negative for AFB or fungal organisms but she grew pan sensitive pseudomonas. 01/2014 hemoglobin 13.7 03/2014 Sprio> Ratio 60%, FEV1 1.29L (61% pred)   10/01/2014 ROV> Lori Brady says that she was doing Ok up until Saturday. She started developing abdominal fullness and worsening dyspnea, wheezing and cough. Her dyspnea has progressed since then. She has been taking all of her medications regularly. She has been wheezing more and coughing up yellow mucus. She took the levaquin and prednisone we gave her helped a lot. She had some folks come out to service her [facility] and she looked at her unit and it showed a lot of mold. This was the first time this was seen (she has her unit serviced regularly).     Events since last clinic visit: 10/22/14 -  Pt states she needs her Prednisone taper refilled again as she is having cough-yellow in color, SOB with wheezing and knows this will get worse if she doesn't start something today. Pt lives in [place] and can not come to [place] office to be seen for acute visit today. Prednisone 10 mg take 4 each am x 2 days, 2 each am x 2 days, 1 each am x 2 days and stop but ov by 10/26/14 if not improving  10/31/14  - patient called and stated that she request a refill for prednisone taper again, due to having c/o prod cough with white mucus since 12/15/15and, chest tightness with coughing. No fever, sinus congestion.  11/01/14 - currently with mouth breathing, wheezing for the past 2 days, also chest tightness, using rescue inhalers QID for the past 2 days. No fever, night sweats, no chills.  Sputum is productive with thick  white-yellowish.  Uncle recently passed and she is feeling very stressed. Has seen [facility], they recommended using nasal saline irrigations/rinses and performed mold testing on arm and allergy testing on her back. Recently found out that she had black mold in her [facility] unit, since then has had filtration system and dehydration system placed to help remove mold and keep moisture level down.   PMHX:   Past Medical History  Diagnosis Date  . Asthma   . Arthritis   . History of chicken pox   . Emphysema of lung   . GERD (gastroesophageal reflux disease)   . Seasonal allergies   . Hypertension   . Hypercholesteremia   . Hx of completed stroke   . History of heart attack   . UTI (lower urinary tract infection)    Surgical Hx:  Past Surgical History  Procedure Laterality Date  . Breast biopsy     Family Hx:  Family History  Problem Relation Age of Onset  . Cancer Sister     ovarian/uterine/breast  . Cancer Sister     breast  . Heart disease Father   . Heart disease Brother     2 brothers  . Heart disease Sister   . Diabetes Maternal Grandmother    Social Hx:   History  Substance Use Topics  . Smoking status: Former Smoker -- 1.00 packs/day for 30 years    Types: Cigarettes  Quit date: 08/01/2008  . Smokeless tobacco: Never Used  . Alcohol Use: Yes     Comment: occasional   Medication:   Current Outpatient Rx  Name  Route  Sig  Dispense  Refill  . albuterol (ACCUNEB) 0.63 MG/3ML nebulizer solution   Nebulization   Take 1 ampule by nebulization every 6 (six) hours as needed for wheezing.         Marland Kitchen albuterol (PROVENTIL HFA;VENTOLIN HFA) 108 (90 BASE) MCG/ACT inhaler   Inhalation   Inhale 2 puffs into the lungs every 6 (six) hours as needed for wheezing or shortness of breath.         . ALPRAZolam (XANAX) 0.5 MG tablet   Oral   Take 0.5 mg by mouth at bedtime as needed for anxiety.         Marland Kitchen aspirin 81 MG tablet   Oral   Take 81 mg by mouth daily.          . budesonide-formoterol (SYMBICORT) 160-4.5 MCG/ACT inhaler   Inhalation   Inhale 2 puffs into the lungs 2 (two) times daily.   1 Inhaler   6   . chlorthalidone (HYGROTON) 25 MG tablet   Oral   Take 25 mg by mouth daily.         . Cholecalciferol (VITAMIN D) 2000 UNITS CAPS   Oral   Take 1 capsule by mouth daily.         Marland Kitchen lisinopril (PRINIVIL,ZESTRIL) 10 MG tablet   Oral   Take 10 mg by mouth daily.         . montelukast (SINGULAIR) 10 MG tablet   Oral   Take 10 mg by mouth at bedtime.         . Omega-3 Fatty Acids (FISH OIL) 1000 MG CAPS   Oral   Take 1 capsule by mouth daily.         . potassium chloride (KLOR-CON) 20 MEQ packet   Oral   Take 20 mEq by mouth 2 (two) times daily.         . simvastatin (ZOCOR) 40 MG tablet   Oral   Take 40 mg by mouth daily.         Marland Kitchen Spacer/Aero-Holding Chambers (AEROCHAMBER MV) inhaler      Use as instructed with inhaler.  DX 496.   1 each   0   . tiotropium (SPIRIVA) 18 MCG inhalation capsule   Inhalation   Place 18 mcg into inhaler and inhale daily.         Marland Kitchen zolpidem (AMBIEN CR) 12.5 MG CR tablet   Oral   Take 1 tablet (12.5 mg total) by mouth at bedtime as needed for sleep.   3 tablet   0   . fluticasone (FLONASE) 50 MCG/ACT nasal spray   Each Nare   Place 2 sprays into both nostrils daily.         Marland Kitchen levofloxacin (LEVAQUIN) 750 MG tablet   Oral   Take 1 tablet (750 mg total) by mouth daily.   7 tablet   0   . potassium chloride SA (K-DUR,KLOR-CON) 20 MEQ tablet   Oral   Take 20 mEq by mouth daily.         . predniSONE (DELTASONE) 20 MG tablet      Take 40mg  x3 days, 30mg  x3 days, 20mg  x3 days. 10mg  x3 days   30 tablet   0      Review of Systems: Gen:  Denies  fever, sweats, chills HEENT: Denies blurred vision, double vision, ear pain, eye pain, hearing loss, nose bleeds, sore throat Cvc:  No dizziness, chest pain or heaviness Resp:   Sob, wheezing, sputum production Gi:  Denies swallowing difficulty, stomach pain, nausea or vomiting, diarrhea, constipation, bowel incontinence Gu:  Denies bladder incontinence, burning urine Ext:   No Joint pain, stiffness or swelling Skin: No skin rash, easy bruising or bleeding or hives Endoc:  No polyuria, polydipsia , polyphagia or weight change Psych: No depression, insomnia or hallucinations  Other:  All other systems negative  Allergies:  Vicodin  Physical Examination:  VS: BP 112/72 mmHg  Pulse 95  Temp(Src) 97.9 F (36.6 C) (Oral)  Ht 5' 3.5" (1.613 m)  Wt 143 lb 6.4 oz (65.046 kg)  BMI 25.00 kg/m2  SpO2 97%  General Appearance: No distress  Neuro: EXAM: without focal findings, mental status, speech normal, alert and oriented, cranial nerves 2-12 grossly normal  HEENT: PERRLA, EOM intact, no ptosis, no other lesions noticed Pulmonary:shallow breath sounds, mild basilar wheezing, coarse upper airway sounds. Sputum production - none in the office.  Cardiovascular:@ Exam:  Normal S1,S2.  No m/r/g.     Abdomen:Exam: Benign, Soft, non-tender, No masses  Skin:   warm, no rashes, no ecchymosis  Extremities: normal, no cyanosis, clubbing, no edema, warm with normal capillary refill.   Labs results:  BMP No results found for: NA, K, CL, CO2, GLUCOSE, BUN, CREATININE   CBC CBC Latest Ref Rng 08/06/2014  WBC 4.0 - 10.5 K/uL 15.1(H)  Hemoglobin 12.0 - 15.0 g/dL 30.813.1  Hematocrit 65.736.0 - 46.0 % 38.6  Platelets 150.0 - 400.0 K/uL 230.0     Assessment and Plan: COPD exacerbation She is having yet another exacerbation of COPD.   Plan: -prednisone taper x 12 days, starting at 40mg , taper by 10mg  every 3 days -levaquin x 7 days - use albuterol neb q6hrs for the next 3 days, then prn for sob\wheezing.  -if she continues to have these exacerbations then may need to consider bronchoscopy with BAL, Dr. Kendrick FriesMcQuaid will further address.   COPD, severe  Again as per Dr. Kendrick FriesMcQuaid impression, which I am in agreement with -  the discovery of black mold [facility] is possibly triggering these episodes, along with a possible anxiety component.  Her sputum culture did not grow out mold but her serum IgE is high indicative of a possible allergy contributing to her recurrent exacerbations. She has taken step for mold removal and [facility] the [facility].   Though her imaging (CT chest) is not indicative of ABPA, there is concern about ABPA given the decline in the last 6 months.  Plan: - check Aspergillus specific IgE after prednisone taper, hopefully she can stay off prednisone long enough  - she has followed up with [facility], they recommend to continue with inhaler and added nasal saline irrigation, no clear etiology at this time for elevated IgE level - continue symbicort and spiriva - f/u January 2016 with Dr. Kendrick FriesMcQuaid - if she continues to have these exacerbations then may need to consider bronchoscopy with BAL      Updated Medication List Outpatient Encounter Prescriptions as of 11/01/2014  Medication Sig  . albuterol (ACCUNEB) 0.63 MG/3ML nebulizer solution Take 1 ampule by nebulization every 6 (six) hours as needed for wheezing.  Marland Kitchen. albuterol (PROVENTIL HFA;VENTOLIN HFA) 108 (90 BASE) MCG/ACT inhaler Inhale 2 puffs into the lungs every 6 (six)  hours as needed for wheezing or shortness of breath.  . ALPRAZolam (XANAX) 0.5 MG tablet Take 0.5 mg by mouth at bedtime as needed for anxiety.  Marland Kitchen aspirin 81 MG tablet Take 81 mg by mouth daily.  . budesonide-formoterol (SYMBICORT) 160-4.5 MCG/ACT inhaler Inhale 2 puffs into the lungs 2 (two) times daily.  . chlorthalidone (HYGROTON) 25 MG tablet Take 25 mg by mouth daily.  . Cholecalciferol (VITAMIN D) 2000 UNITS CAPS Take 1 capsule by mouth daily.  Marland Kitchen lisinopril (PRINIVIL,ZESTRIL) 10 MG tablet Take 10 mg by mouth daily.  . montelukast (SINGULAIR) 10 MG tablet Take 10 mg by mouth at bedtime.  . Omega-3 Fatty Acids (FISH OIL)  1000 MG CAPS Take 1 capsule by mouth daily.  . potassium chloride (KLOR-CON) 20 MEQ packet Take 20 mEq by mouth 2 (two) times daily.  . predniSONE (DELTASONE) 10 MG tablet Take 40mg  po qd X 3 days, then 30mg  po qd X 3 days, then 20mg  po qd X 2 days, then 10mg  po qd X 2 days, then 5mg  qd X 7 days  . predniSONE (DELTASONE) 10 MG tablet Take 4 tabs x 2 days, then 2 tabs x 2 days, then 1 tab x 2 days, then stop.  . simvastatin (ZOCOR) 40 MG tablet Take 40 mg by mouth daily.  Marland Kitchen Spacer/Aero-Holding Chambers (AEROCHAMBER MV) inhaler Use as instructed with inhaler.  DX 496.  . tiotropium (SPIRIVA) 18 MCG inhalation capsule Place 18 mcg into inhaler and inhale daily.  Marland Kitchen zolpidem (AMBIEN CR) 12.5 MG CR tablet Take 1 tablet (12.5 mg total) by mouth at bedtime as needed for sleep.  . fluticasone (FLONASE) 50 MCG/ACT nasal spray Place 2 sprays into both nostrils daily.  . potassium chloride SA (K-DUR,KLOR-CON) 20 MEQ tablet Take 20 mEq by mouth daily.    Orders for this visit: No orders of the defined types were placed in this encounter.    Thank  you for the visitation and for allowing  [facility] to assist in the care of your patient. Our recommendations are noted above.  Please contact us if we can be of further service.  Stephanie Acre, MD [facility] Office Number: [phone]

## 2014-11-01 NOTE — Assessment & Plan Note (Addendum)
She is having yet another exacerbation of COPD.  See discussion above.  Plan: -prednisone taper x 12 days, starting at 40mg , taper by 10mg  every 3 days -levaquin x 7 days -use albuterol neb q6hrs for the next 3 days, then prn for sob\wheezing.  -2 view CXR prior to next visit with Dr. Kendrick Fries -if she continues to have these exacerbations then may need to consider bronchoscopy with BAL, Dr. Kendrick Fries will further address.

## 2014-11-02 NOTE — Progress Notes (Signed)
erro  neous encounter

## 2014-11-21 ENCOUNTER — Ambulatory Visit: Payer: Medicare HMO | Admitting: Internal Medicine

## 2014-12-05 ENCOUNTER — Ambulatory Visit: Payer: Medicare HMO | Admitting: Internal Medicine

## 2014-12-06 NOTE — Progress Notes (Signed)
Quick Note:  Pt aware of results. ______ 

## 2014-12-17 ENCOUNTER — Ambulatory Visit: Payer: Self-pay | Admitting: Pulmonary Disease

## 2014-12-17 ENCOUNTER — Telehealth: Payer: Self-pay | Admitting: Pulmonary Disease

## 2014-12-17 DIAGNOSIS — J449 Chronic obstructive pulmonary disease, unspecified: Secondary | ICD-10-CM

## 2014-12-17 NOTE — Telephone Encounter (Signed)
Order will be faxed to [phone].

## 2014-12-19 ENCOUNTER — Ambulatory Visit (INDEPENDENT_AMBULATORY_CARE_PROVIDER_SITE_OTHER): Payer: Medicare HMO | Admitting: Pulmonary Disease

## 2014-12-19 ENCOUNTER — Encounter: Payer: Self-pay | Admitting: Pulmonary Disease

## 2014-12-19 VITALS — BP 136/78 | HR 96 | Ht 63.5 in | Wt 147.0 lb

## 2014-12-19 DIAGNOSIS — J449 Chronic obstructive pulmonary disease, unspecified: Secondary | ICD-10-CM

## 2014-12-19 NOTE — Progress Notes (Signed)
Subjective:    Patient ID: Lori Brady, female    DOB: 04/26/1946, 69 y.o.   MRN: [id number]  Synopsis: Lori Brady has COPD and was referred in 2015 for the same.  She had a high IgE level (625) and a sputum culture was negative for AFB or fungal organisms but she grew pan sensitive pseudomonas.  01/2014 hemoglobin 13.7 03/2014 Spiro> Ratio 60%, FEV1 1.29L (61% pred)   HPI Chief Complaint  Patient presents with  . Follow-up    pt c/o occasional prod cough.  CAT score 9.    Lori Brady says that she thinks she has been doing better.  She has had her house remediated for mold and she thinks this has helped.  She has a Psychiatric nurse and air purifiers.  She has been taking mucinex DM. She denies dyspnea, cough, mucus production or wheezing. She has been feelin better.  She is supposed to have skin testing in [place] with Dr. Annamaria Boots.   Past Medical History  Diagnosis Date  . Asthma   . Arthritis   . History of chicken pox   . Emphysema of lung   . GERD (gastroesophageal reflux disease)   . Seasonal allergies   . Hypertension   . Hypercholesteremia   . Hx of completed stroke   . History of heart attack   . UTI (lower urinary tract infection)      Review of Systems  Constitutional: Negative for chills, fatigue and unexpected weight change.  HENT: Negative for postnasal drip, rhinorrhea and sinus pressure.   Respiratory: Negative for cough, shortness of breath and wheezing.   Cardiovascular: Negative for chest pain, palpitations and leg swelling.       Objective:   Physical Exam Filed Vitals:   12/19/14 1007  BP: 136/78  Pulse: 96  Height: 5' 3.5" (1.613 m)  Weight: 147 lb (66.679 kg)  SpO2: 99%   RA  Gen: well appearing, no acute distress HEENT: NCAT, EOMi, OP clear PULM: Clear to auscultation bilaterally CV: RRR, no mgr, no JVD AB: BS+, soft, nontender, Ext: warm, no edema, no clubbing, no cyanosis Derm: no rash or skin breakdown Neuro: A&Ox4, MAEW  Chest x-ray  from 12/17/2014 images reviewed> emphysema but otherwise normal lung parenchyma     Assessment & Plan:   COPD, severe This has been a stable interval for Lori Brady. After having her home remediated for mold it seems as if her symptoms have stabilized. Unfortunately, she did require treatment for an exacerbation last month but things seem to have settled down. She is due for further allergy testing later this month. I think it is reasonable to proceed with that in case she has recurrent symptoms so we would know what to focus on.  Plan: -Follow-up with allergy testing -Continue inhaled Spiriva and Symbicort -Follow-up 3 months with me     Updated Medication List Outpatient Encounter Prescriptions as of 12/19/2014  Medication Sig  . albuterol (ACCUNEB) 0.63 MG/3ML nebulizer solution Take 1 ampule by nebulization every 6 (six) hours as needed for wheezing.  Marland Kitchen albuterol (PROVENTIL HFA;VENTOLIN HFA) 108 (90 BASE) MCG/ACT inhaler Inhale 2 puffs into the lungs every 6 (six) hours as needed for wheezing or shortness of breath.  . ALPRAZolam (XANAX) 0.5 MG tablet Take 0.5 mg by mouth at bedtime as needed for anxiety.  Marland Kitchen aspirin 81 MG tablet Take 81 mg by mouth daily.  . budesonide-formoterol (SYMBICORT) 160-4.5 MCG/ACT inhaler Inhale 2 puffs into the lungs 2 (two) times daily.  Marland Kitchen  chlorthalidone (HYGROTON) 25 MG tablet Take 25 mg by mouth daily.  . Cholecalciferol (VITAMIN D) 2000 UNITS CAPS Take 1 capsule by mouth daily.  Marland Kitchen dextromethorphan-guaiFENesin (MUCINEX DM) 30-600 MG per 12 hr tablet Take 1 tablet by mouth 2 (two) times daily.  . fluticasone (FLONASE) 50 MCG/ACT nasal spray Place 2 sprays into both nostrils daily.  Marland Kitchen lisinopril (PRINIVIL,ZESTRIL) 10 MG tablet Take 10 mg by mouth daily.  . montelukast (SINGULAIR) 10 MG tablet Take 10 mg by mouth at bedtime.  . Omega-3 Fatty Acids (FISH OIL) 1000 MG CAPS Take 1 capsule by mouth daily.  . potassium chloride (KLOR-CON) 20 MEQ packet Take 20 mEq  by mouth 2 (two) times daily.  . potassium chloride SA (K-DUR,KLOR-CON) 20 MEQ tablet Take 20 mEq by mouth daily.  . simvastatin (ZOCOR) 40 MG tablet Take 40 mg by mouth daily.  Marland Kitchen Spacer/Aero-Holding Chambers (AEROCHAMBER MV) inhaler Use as instructed with inhaler.  DX 496.  . tiotropium (SPIRIVA) 18 MCG inhalation capsule Place 18 mcg into inhaler and inhale daily.  Marland Kitchen zolpidem (AMBIEN CR) 12.5 MG CR tablet Take 1 tablet (12.5 mg total) by mouth at bedtime as needed for sleep.  . [DISCONTINUED] levofloxacin (LEVAQUIN) 750 MG tablet Take 1 tablet (750 mg total) by mouth daily. (Patient not taking: Reported on 12/19/2014)  . [DISCONTINUED] predniSONE (DELTASONE) 20 MG tablet Take 40m x3 days, 326mx3 days, 2034m3 days. 20m84m days (Patient not taking: Reported on 12/19/2014)

## 2014-12-19 NOTE — Assessment & Plan Note (Signed)
This has been a stable interval for Lori Brady. After having her home remediated for mold it seems as if her symptoms have stabilized. Unfortunately, she did require treatment for an exacerbation last month but things seem to have settled down. She is due for further allergy testing later this month. I think it is reasonable to proceed with that in case she has recurrent symptoms so we would know what to focus on.  Plan: -Follow-up with allergy testing -Continue inhaled Spiriva and Symbicort -Follow-up 3 months with me

## 2014-12-19 NOTE — Patient Instructions (Signed)
Keep taking your medicines as you are doing Keep the allergy test with Dr. Annamaria Boots We will see you back in 6 months or sooner if needed

## 2015-01-09 ENCOUNTER — Encounter: Payer: Self-pay | Admitting: Internal Medicine

## 2015-01-09 ENCOUNTER — Ambulatory Visit (INDEPENDENT_AMBULATORY_CARE_PROVIDER_SITE_OTHER): Payer: Medicare HMO | Admitting: Internal Medicine

## 2015-01-09 ENCOUNTER — Other Ambulatory Visit: Payer: Medicare HMO

## 2015-01-09 VITALS — BP 134/76 | HR 93 | Ht 63.0 in | Wt 146.0 lb

## 2015-01-09 DIAGNOSIS — J449 Chronic obstructive pulmonary disease, unspecified: Secondary | ICD-10-CM

## 2015-01-09 DIAGNOSIS — J42 Unspecified chronic bronchitis: Secondary | ICD-10-CM

## 2015-01-09 NOTE — Patient Instructions (Addendum)
Order- lab- Allergy profile, Food IgE profile, Immunoglobulins: IgG, IgA, IgM,                  Cystic Fibrosis  panel     Dx Chronic bronchitis  You can make a follow-up appointment with Dr Kendrick Fries

## 2015-01-09 NOTE — Assessment & Plan Note (Signed)
Intermittent exacerbations of chronic bronchitis. She describes thick sputum and had cultured Pseudomonas. Family history of cystic fibrosis is negative but she might carry a variant gene . She has been skin test negative twice had another allergy office We can very her immune system by a different test to see if we can spot an explanation for her pattern.  Plan-cystic fibrosis panel, immunoglobulins, allergy profile

## 2015-01-09 NOTE — Progress Notes (Signed)
01/09/15-68 yo F former smoker Referred courtesy of Dr Lake Bells for allergy evaluation.; allergies due to mold in furnace; COPD got worse during this time; Better after home remediation. Aspergillus IgE panel Neg 09/21/14 IgE   Total 625 08/06/14    EOS 1.0% Sputum had cultured pseudomonas  Allergy skin testing 5 years ago and again in November 2015 by Dr. Ron Parker reported negative by the patient. She gives history of chronic cough productive of white to occasionally green, nonbloody sputum. Aware of diagnosed COPD-quit smoking in 2009. History spring and fall pollen rhinitis. Trigger for chest tightness, cough and wheeze is most commonly weather changes and wind Dry skin followed as eczema by dermatologist.  Prior to Admission medications   Medication Sig Start Date End Date Taking? Authorizing Provider  albuterol (ACCUNEB) 0.63 MG/3ML nebulizer solution Take 1 ampule by nebulization every 6 (six) hours as needed for wheezing.   Yes Historical Provider, MD  albuterol (PROVENTIL HFA;VENTOLIN HFA) 108 (90 BASE) MCG/ACT inhaler Inhale 2 puffs into the lungs every 6 (six) hours as needed for wheezing or shortness of breath.   Yes Historical Provider, MD  ALPRAZolam Duanne Moron) 0.5 MG tablet Take 0.5 mg by mouth at bedtime as needed for anxiety.   Yes Historical Provider, MD  aspirin 81 MG tablet Take 81 mg by mouth daily.   Yes Historical Provider, MD  budesonide-formoterol (SYMBICORT) 160-4.5 MCG/ACT inhaler Inhale 2 puffs into the lungs 2 (two) times daily. 10/31/14  Yes Juanito Doom, MD  chlorthalidone (HYGROTON) 25 MG tablet Take 25 mg by mouth daily.   Yes Historical Provider, MD  Cholecalciferol (VITAMIN D) 2000 UNITS CAPS Take 1 capsule by mouth daily.   Yes Historical Provider, MD  dextromethorphan-guaiFENesin (MUCINEX DM) 30-600 MG per 12 hr tablet Take 1 tablet by mouth 2 (two) times daily.   Yes Historical Provider, MD  fluticasone (FLONASE) 50 MCG/ACT nasal spray Place 2 sprays into both  nostrils daily. 10/25/14  Yes Historical Provider, MD  lisinopril (PRINIVIL,ZESTRIL) 10 MG tablet Take 10 mg by mouth daily.   Yes Historical Provider, MD  montelukast (SINGULAIR) 10 MG tablet Take 10 mg by mouth at bedtime.   Yes Historical Provider, MD  Omega-3 Fatty Acids (FISH OIL) 1000 MG CAPS Take 1 capsule by mouth daily.   Yes Historical Provider, MD  potassium chloride SA (K-DUR,KLOR-CON) 20 MEQ tablet Take 20 mEq by mouth daily. 09/11/14  Yes Historical Provider, MD  simvastatin (ZOCOR) 40 MG tablet Take 40 mg by mouth daily.   Yes Historical Provider, MD  Spacer/Aero-Holding Chambers (AEROCHAMBER MV) inhaler Use as instructed with inhaler.  DX 496. 08/01/14  Yes Juanito Doom, MD  tiotropium (SPIRIVA) 18 MCG inhalation capsule Place 18 mcg into inhaler and inhale daily.   Yes Historical Provider, MD  zolpidem (AMBIEN CR) 12.5 MG CR tablet Take 1 tablet (12.5 mg total) by mouth at bedtime as needed for sleep. 10/01/14 10/01/15 Yes Juanito Doom, MD   Past Medical History  Diagnosis Date  . Asthma   . Arthritis   . History of chicken pox   . Emphysema of lung   . GERD (gastroesophageal reflux disease)   . Seasonal allergies   . Hypertension   . Hypercholesteremia   . Hx of completed stroke   . History of heart attack   . UTI (lower urinary tract infection)    Past Surgical History  Procedure Laterality Date  . Breast biopsy     Family History  Problem Relation Age  of Onset  . Cancer Sister     ovarian/uterine/breast  . Cancer Sister     breast  . Heart disease Father   . Heart disease Brother     2 brothers  . Heart disease Sister   . Diabetes Maternal Grandmother    History   Social History  . Marital Status: Widowed    Spouse Name: N/A  . Number of Children: N/A  . Years of Education: N/A   Occupational History  . Not on file.   Social History Main Topics  . Smoking status: Former Smoker -- 1.00 packs/day for 30 years    Types: Cigarettes     Quit date: 08/01/2008  . Smokeless tobacco: Never Used  . Alcohol Use: Yes     Comment: occasional  . Drug Use: Not on file  . Sexual Activity: Not on file   Other Topics Concern  . Not on file   Social History Narrative   ROS-see HPI   Negative unless "+" Constitutional:    weight loss, night sweats, fevers, chills, fatigue, lassitude. HEENT:    headaches, difficulty swallowing, tooth/dental problems, sore throat,       sneezing, itching, ear ache, nasal congestion, post nasal drip, snoring CV:    chest pain, orthopnea, PND, swelling in lower extremities, anasarca,                                  dizziness, palpitations Resp:   +shortness of breath with exertion or at rest.                productive cough,   non-productive cough, coughing up of blood.              change in color of mucus.  wheezing.   Skin:    rash or lesions. GI:  No-   Heartburn,+ indigestion, abdominal pain, nausea, vomiting, diarrhea,                 change in bowel habits, loss of appetite GU: dysuria, change in color of urine, no urgency or frequency.   flank pain. MS:   joint pain, stiffness, decreased range of motion, back pain. Neuro-     nothing unusual Psych:  change in mood or affect.  depression or +anxiety.   memory loss.  OBJ- Physical Exam General- Alert, Oriented, Affect-appropriate, Distress- none acute Skin- rash-none, lesions- none, excoriation- none Lymphadenopathy- none Head- atraumatic            Eyes- Gross vision intact, PERRLA, conjunctivae and secretions clear            Ears- Hearing, canals-normal            Nose- Clear, no-Septal dev, mucus, polyps, erosion, perforation             Throat- Mallampati II , mucosa clear , drainage- none, tonsils- atrophic Neck- flexible , trachea midline, no stridor , thyroid nl, carotid no bruit Chest - symmetrical excursion , unlabored           Heart/CV- RRR , no murmur , no gallop  , no rub, nl s1 s2                           - JVD- none ,  edema- none, stasis changes- none, varices- none           Lung- clear  to P&A, wheeze- none, cough- none , dullness-none, rub- none           Chest wall-  Abd-  Br/ Gen/ Rectal- Not done, not indicated Extrem- cyanosis- none, clubbing, none, atrophy- none, strength- nl Neuro- grossly intact to observation

## 2015-01-10 LAB — ALLERGY FULL PROFILE
Alternaria Alternata: <0.1 kU/L
Aspergillus fumigatus, m3: <0.1 kU/L
Bahia Grass: <0.1 kU/L
Bermuda Grass: <0.1 kU/L
Candida Albicans: <0.1 kU/L
Cat Dander: <0.1 kU/L
Curvularia lunata: <0.1 kU/L
D. farinae: <0.1 kU/L
Elm IgE: <0.1 kU/L
Fescue: <0.1 kU/L
G005 Rye, Perennial: <0.1 kU/L
G009 Red Top: <0.1 kU/L
Lamb's Quarters: <0.1 kU/L
Oak: <0.1 kU/L
Stemphylium Botryosum: <0.1 kU/L
Sycamore Tree: <0.1 kU/L
Timothy Grass: <0.1 kU/L

## 2015-01-10 LAB — ALLERGEN FOOD PROFILE SPECIFIC IGE
Corn: <0.1 kU/L
Egg White IgE: <0.1 kU/L
Fish Cod: <0.1 kU/L
IgE (Immunoglobulin E), Serum: 231 kU/L — ABNORMAL HIGH (ref <115)
MILK IGE: 0.16 kU/L — AB
Peanut IgE: <0.1 kU/L
Soybean IgE: <0.1 kU/L
Tomato IgE: <0.1 kU/L
Wheat IgE: <0.1 kU/L

## 2015-01-10 LAB — IGG, IGA, IGM
IGA: 203 mg/dL (ref 69–380)
IGG (IMMUNOGLOBIN G), SERUM: 708 mg/dL (ref 690–1700)
IgM, Serum: 122 mg/dL (ref 52–322)

## 2015-01-10 LAB — CYSTIC FIBROSIS DIAGNOSTIC STUDY

## 2015-01-11 ENCOUNTER — Encounter: Payer: Self-pay | Admitting: Pulmonary Disease

## 2015-01-21 ENCOUNTER — Encounter: Payer: Self-pay | Admitting: Internal Medicine

## 2015-01-21 ENCOUNTER — Ambulatory Visit (INDEPENDENT_AMBULATORY_CARE_PROVIDER_SITE_OTHER): Payer: Medicare HMO | Admitting: Internal Medicine

## 2015-01-21 VITALS — BP 116/70 | HR 109 | Temp 97.6°F | Ht 63.0 in | Wt 140.0 lb

## 2015-01-21 DIAGNOSIS — J441 Chronic obstructive pulmonary disease with (acute) exacerbation: Secondary | ICD-10-CM

## 2015-01-21 MED ORDER — PREDNISONE 20 MG PO TABS: 20.0000 mg | ORAL_TABLET | Freq: Every day | ORAL | Status: DC

## 2015-01-21 MED ORDER — AZITHROMYCIN 250 MG PO TABS: ORAL_TABLET | ORAL | Status: DC

## 2015-01-21 NOTE — Assessment & Plan Note (Signed)
She is having yet another exacerbation of COPD - most likely secondary to recent sick contacts  Plan: - prednisone 20 mg x5 days -Albuterol nebulizers every 6 hours for the next 3 days , thenevery 4 hours as needed  -azithromycin x5 days -Check an absolute eosinophil count -Followup per Dr. Kendrick Fries in 3-4 weeks

## 2015-01-21 NOTE — Patient Instructions (Signed)
Follow up with Dr. Kendrick Fries in 3-4 weeks at [facility] - albuterol nebulizer =-every 6 hours for 3 days, then as needed every 3-4 hours as needed for shortness of breath\wheezing\recurrent cough - Prednisone 20 mg, 1 tab with breakfast x 5 days - zpak - follow instructions on packet - absolute eosinophil count prior to visit with Dr. Kendrick Fries

## 2015-01-21 NOTE — Progress Notes (Signed)
MRN# [id number] Lori Brady 1946/02/19   CC: Chief Complaint  Patient presents with  . Acute Visit    Pt reports increased sob for past 3 days.  She has cough with yellow mucus, wheezing and headaches. She denies chest tightness.      Brief History: Synopsis: Kinza Luchs has COPD and was referred in 2015 for the same. She had a high IgE level (625) and a sputum culture was negative for AFB or fungal organisms but she grew pan sensitive pseudomonas. 01/2014 hemoglobin 13.7 03/2014 Spiro> Ratio 60%, FEV1 1.29L (61% pred)  12/2013 - Visit with [facility]  Intermittent exacerbations of chronic bronchitis. She describes thick sputum and had cultured Pseudomonas. Family history of cystic fibrosis is negative but she might carry a variant gene . She has been skin test negative twice had another allergy office We can very her immune system by a different test to see if we can spot an explanation for her pattern. Plan-cystic fibrosis panel, immunoglobulins, allergy profile  Events since last clinic visit: Issue presents today for an acute care visit, she is accompanied by her sister. Patient states since Friday she has had cough productive sputum (yellowish tinge), accompanied with increasing shortness of breath especially with exertion, decreased appetite, audible wheezing, chest tightness, subjective chills. Patient states she's had recent sick contacts her sister had a upper respiratory tract infection, and her son also had a recent upper respiratory tract infection. She has been using her albuterol inhaler 4-5 times per day for the last 3 days in addition to the albuterol nebulizer twice a day. Patient recently had allergy testing in [facility] by Dr. Maple Hudson see his note for further details.      PMHX:   Past Medical History  Diagnosis Date  . Asthma   . Arthritis   . History of chicken pox   . Emphysema of lung   . GERD (gastroesophageal reflux disease)   . Seasonal allergies   .  Hypertension   . Hypercholesteremia   . Hx of completed stroke   . History of heart attack   . UTI (lower urinary tract infection)    Surgical Hx:  Past Surgical History  Procedure Laterality Date  . Breast biopsy     Family Hx:  Family History  Problem Relation Age of Onset  . Cancer Sister     ovarian/uterine/breast  . Cancer Sister     breast  . Heart disease Father   . Heart disease Brother     2 brothers  . Heart disease Sister   . Diabetes Maternal Grandmother    Social Hx:   History  Substance Use Topics  . Smoking status: Former Smoker -- 1.00 packs/day for 30 years    Types: Cigarettes    Quit date: 08/01/2008  . Smokeless tobacco: Never Used  . Alcohol Use: Yes     Comment: occasional   Medication:   Current Outpatient Rx  Name  Route  Sig  Dispense  Refill  . albuterol (ACCUNEB) 0.63 MG/3ML nebulizer solution   Nebulization   Take 1 ampule by nebulization every 6 (six) hours as needed for wheezing.         Marland Kitchen albuterol (PROVENTIL HFA;VENTOLIN HFA) 108 (90 BASE) MCG/ACT inhaler   Inhalation   Inhale 2 puffs into the lungs every 6 (six) hours as needed for wheezing or shortness of breath.         . ALPRAZolam (XANAX) 0.5 MG tablet   Oral   Take  0.5 mg by mouth at bedtime as needed for anxiety.         Marland Kitchen aspirin 81 MG tablet   Oral   Take 81 mg by mouth daily.         . budesonide-formoterol (SYMBICORT) 160-4.5 MCG/ACT inhaler   Inhalation   Inhale 2 puffs into the lungs 2 (two) times daily.   1 Inhaler   6   . chlorthalidone (HYGROTON) 25 MG tablet   Oral   Take 25 mg by mouth daily.         . Cholecalciferol (VITAMIN D) 2000 UNITS CAPS   Oral   Take 1 capsule by mouth daily.         Marland Kitchen dextromethorphan-guaiFENesin (MUCINEX DM) 30-600 MG per 12 hr tablet   Oral   Take 1 tablet by mouth 2 (two) times daily.         . fluticasone (FLONASE) 50 MCG/ACT nasal spray   Each Nare   Place 2 sprays into both nostrils daily.          Marland Kitchen lisinopril (PRINIVIL,ZESTRIL) 10 MG tablet   Oral   Take 10 mg by mouth daily.         . montelukast (SINGULAIR) 10 MG tablet   Oral   Take 10 mg by mouth at bedtime.         . Omega-3 Fatty Acids (FISH OIL) 1000 MG CAPS   Oral   Take 1 capsule by mouth daily.         . potassium chloride SA (K-DUR,KLOR-CON) 20 MEQ tablet   Oral   Take 20 mEq by mouth daily.         . simvastatin (ZOCOR) 40 MG tablet   Oral   Take 40 mg by mouth daily.         Marland Kitchen Spacer/Aero-Holding Chambers (AEROCHAMBER MV) inhaler      Use as instructed with inhaler.  DX 496.   1 each   0   . tiotropium (SPIRIVA) 18 MCG inhalation capsule   Inhalation   Place 18 mcg into inhaler and inhale daily.            Review of Systems: Gen:  chills HEENT: Denies blurred vision, double vision, ear pain, eye pain, hearing loss, nose bleeds, sore throat Cvc:  No dizziness, chest pain or heaviness Resp:   Cough, sputum production, shortness of breath, wheezing Gi: Denies swallowing difficulty, stomach pain, nausea or vomiting, diarrhea, constipation, bowel incontinence Gu:  Denies bladder incontinence, burning urine Ext:   No Joint pain, stiffness or swelling Skin: No skin rash, easy bruising or bleeding or hives Endoc:  No polyuria, polydipsia , polyphagia or weight change Psych: No depression, insomnia or hallucinations  Other:  All other systems negative  Allergies:  Vicodin  Physical Examination:  VS: BP 116/70 mmHg  Pulse 109  Temp(Src) 97.6 F (36.4 C) (Oral)  Ht  (1.6 m)  Wt 140 lb (63.504 kg)  BMI 24.81 kg/m2  SpO2 95%  General Appearance: No distress  HEENT: PERRLA, EOM intact, no ptosis, no other lesions noticed Pulmonary:Exam: shallow respiratory effort, decreased breath sounds bilateral bases, diffuse expiratory wheeze, I:E Ratio 1:2  , coarse upper airway sounds Cardiovascular:@ Exam:  Normal S1,S2.  No m/r/g.     Abdomen:Exam: Benign, Soft, non-tender, No masses   Skin:   warm, no rashes, no ecchymosis  Extremities: normal, no cyanosis, clubbing, no edema, warm with normal capillary refill.   Labs results:  BMP No results found for: NA, K, CL, CO2, GLUCOSE, BUN, CREATININE   CBC CBC Latest Ref Rng 08/06/2014  WBC 4.0 - 10.5 K/uL 15.1(H)  Hemoglobin 12.0 - 15.0 g/dL 97.3  Hematocrit 53.2 - 46.0 % 38.6  Platelets 150.0 - 400.0 K/uL 230.0     Rad results: none available     Assessment and Plan:69 year old female past medical history of COPD now presenting with worsening cough, shortness of breath, subjective chills, being treated today for acute exacerbation of COPD No problem-specific assessment & plan notes found for this encounter.   Updated Medication List Outpatient Encounter Prescriptions as of 01/21/2015  Medication Sig  . albuterol (ACCUNEB) 0.63 MG/3ML nebulizer solution Take 1 ampule by nebulization every 6 (six) hours as needed for wheezing.  Marland Kitchen albuterol (PROVENTIL HFA;VENTOLIN HFA) 108 (90 BASE) MCG/ACT inhaler Inhale 2 puffs into the lungs every 6 (six) hours as needed for wheezing or shortness of breath.  . ALPRAZolam (XANAX) 0.5 MG tablet Take 0.5 mg by mouth at bedtime as needed for anxiety.  Marland Kitchen aspirin 81 MG tablet Take 81 mg by mouth daily.  . budesonide-formoterol (SYMBICORT) 160-4.5 MCG/ACT inhaler Inhale 2 puffs into the lungs 2 (two) times daily.  . chlorthalidone (HYGROTON) 25 MG tablet Take 25 mg by mouth daily.  . Cholecalciferol (VITAMIN D) 2000 UNITS CAPS Take 1 capsule by mouth daily.  Marland Kitchen dextromethorphan-guaiFENesin (MUCINEX DM) 30-600 MG per 12 hr tablet Take 1 tablet by mouth 2 (two) times daily.  . fluticasone (FLONASE) 50 MCG/ACT nasal spray Place 2 sprays into both nostrils daily.  Marland Kitchen lisinopril (PRINIVIL,ZESTRIL) 10 MG tablet Take 10 mg by mouth daily.  . montelukast (SINGULAIR) 10 MG tablet Take 10 mg by mouth at bedtime.  . Omega-3 Fatty Acids (FISH OIL) 1000 MG CAPS Take 1 capsule by mouth daily.  .  potassium chloride SA (K-DUR,KLOR-CON) 20 MEQ tablet Take 20 mEq by mouth daily.  . simvastatin (ZOCOR) 40 MG tablet Take 40 mg by mouth daily.  Marland Kitchen Spacer/Aero-Holding Chambers (AEROCHAMBER MV) inhaler Use as instructed with inhaler.  DX 496.  . tiotropium (SPIRIVA) 18 MCG inhalation capsule Place 18 mcg into inhaler and inhale daily.  . [DISCONTINUED] zolpidem (AMBIEN CR) 12.5 MG CR tablet Take 1 tablet (12.5 mg total) by mouth at bedtime as needed for sleep. (Patient not taking: Reported on 01/21/2015)    Orders for this visit: No orders of the defined types were placed in this encounter.    Thank  you for the visitation and for allowing  [facility] to assist in the care of your patient. Our recommendations are noted above.  Please contact us if we can be of further service.  Stephanie Acre, MD [facility] Office Number: [phone]

## 2015-01-30 ENCOUNTER — Telehealth: Payer: Self-pay | Admitting: Pulmonary Disease

## 2015-01-30 MED ORDER — LEVOFLOXACIN 500 MG PO TABS: 500.0000 mg | ORAL_TABLET | Freq: Every day | ORAL | Status: DC

## 2015-01-30 MED ORDER — PREDNISONE 10 MG PO TABS: ORAL_TABLET | ORAL | Status: DC

## 2015-01-30 NOTE — Telephone Encounter (Signed)
Spoke with pt, states that VM gave pt 5 days of pred and abx, hasn't helped symptoms.  C/o prod cough with thick yellow mucus, sinus congestion, pnd, some chest tightness.  Denies fever, sob.    Uses [facility].    Dr. Kendrick Fries please advise.  Thanks!

## 2015-01-30 NOTE — Telephone Encounter (Signed)
Pt aware of recs.  Meds sent in.  Nothing further needed.

## 2015-01-30 NOTE — Telephone Encounter (Signed)
Prednisone: Take 107m po daily for 3 days, then take 379mpo daily for 3 days, then take 2088mo daily for two days, then take 76m67m daily for 2 days  Levaquin 500mg67mdaily 5 days

## 2015-02-22 ENCOUNTER — Ambulatory Visit: Admit: 2015-02-22 | Disposition: A | Discharge status: Home / Self Care | Payer: Self-pay | Admitting: Family Medicine

## 2015-02-28 DIAGNOSIS — I10 Essential (primary) hypertension: Secondary | ICD-10-CM | POA: Insufficient documentation

## 2015-03-04 ENCOUNTER — Other Ambulatory Visit: Payer: Self-pay | Admitting: Internal Medicine

## 2015-03-04 ENCOUNTER — Other Ambulatory Visit (INDEPENDENT_AMBULATORY_CARE_PROVIDER_SITE_OTHER): Payer: Medicare PPO

## 2015-03-04 DIAGNOSIS — J441 Chronic obstructive pulmonary disease with (acute) exacerbation: Secondary | ICD-10-CM

## 2015-03-05 DIAGNOSIS — I2510 Atherosclerotic heart disease of native coronary artery without angina pectoris: Secondary | ICD-10-CM | POA: Insufficient documentation

## 2015-03-05 DIAGNOSIS — I428 Other cardiomyopathies: Secondary | ICD-10-CM | POA: Insufficient documentation

## 2015-03-05 DIAGNOSIS — I429 Cardiomyopathy, unspecified: Secondary | ICD-10-CM | POA: Insufficient documentation

## 2015-03-05 LAB — EOSINOPHIL COUNT: EOS ABS: 0.2 10*3/uL (ref 0.0–0.4)

## 2015-03-06 ENCOUNTER — Encounter: Payer: Self-pay | Admitting: Pulmonary Disease

## 2015-03-06 ENCOUNTER — Ambulatory Visit (INDEPENDENT_AMBULATORY_CARE_PROVIDER_SITE_OTHER): Payer: Medicare PPO | Admitting: Pulmonary Disease

## 2015-03-06 ENCOUNTER — Telehealth: Payer: Self-pay | Admitting: Pulmonary Disease

## 2015-03-06 VITALS — BP 124/66 | HR 97 | Temp 97.5°F | Ht 63.0 in | Wt 140.0 lb

## 2015-03-06 DIAGNOSIS — J449 Chronic obstructive pulmonary disease, unspecified: Secondary | ICD-10-CM

## 2015-03-06 DIAGNOSIS — Z5181 Encounter for therapeutic drug level monitoring: Secondary | ICD-10-CM

## 2015-03-06 DIAGNOSIS — R5383 Other fatigue: Secondary | ICD-10-CM

## 2015-03-06 DIAGNOSIS — R531 Weakness: Secondary | ICD-10-CM | POA: Insufficient documentation

## 2015-03-06 MED ORDER — TIOTROPIUM BROMIDE MONOHYDRATE 18 MCG IN CAPS: 18.0000 ug | ORAL_CAPSULE | Freq: Every day | RESPIRATORY_TRACT | Status: DC

## 2015-03-06 MED ORDER — AZITHROMYCIN 250 MG PO TABS: 250.0000 mg | ORAL_TABLET | Freq: Every day | ORAL | Status: DC

## 2015-03-06 MED ORDER — AZITHROMYCIN 250 MG PO TABS: ORAL_TABLET | ORAL | Status: DC

## 2015-03-06 NOTE — Telephone Encounter (Signed)
Rx has been sent in. Pt is aware. Nothing further was needed. 

## 2015-03-06 NOTE — Patient Instructions (Signed)
We are going to check your thyroid because of the way you've been feeling recently, we'll call you with the results Start taking azithromycin one pill daily no matter how you feel, this is to prevent exacerbations of COPD, if you feel heart palpitations or trouble hearing stopped taking the medication Keep taking the Spiriva as you're doing Follow-up 3 months

## 2015-03-06 NOTE — Progress Notes (Signed)
Subjective:    Patient ID: Lori Brady, female    DOB: 07/08/1946, 69 y.o.   MRN: [id number]  Synopsis: Lori Brady has COPD and was referred in 2015 for the same.  She also has Crohn's disease.  She had a high IgE level (625) and a sputum culture was negative for AFB or fungal organisms but she grew pan sensitive pseudomonas.  01/2014 hemoglobin 13.7 03/2014 Spiro> Ratio 60%, FEV1 1.29L (61% pred)   HPI Chief Complaint  Patient presents with  . Follow-up    Pt c/o increased fatigue, dizziness with exertion.  Pt is having an echo and stress test on 03/18/15 with cardiologist.  Pt having prod cough with white mucus since yesterday.     Lori Brady says taht she continues to have fatigue when she gets up and walks around.  She was treated by my partner for an exacerbation last month.  She has also seen her cardiologist because of the near faintin spells and fatigue and is scheduled for a stress test and echocardiogram next week.  She had a heart cath in 2011 which showed some minimal CAD.   She says from a respiratory standpoint she thinks that the breathing has actually improved. She did start coughing up thick white mucus yesterday.  No associated fevers or chills.   She feels hot all over most of the time. She has sweats frequently, activity makes it worse.  She denies diarrhea and notes a poor appetite.   Past Medical History  Diagnosis Date  . Asthma   . Arthritis   . History of chicken pox   . Emphysema of lung   . GERD (gastroesophageal reflux disease)   . Seasonal allergies   . Hypertension   . Hypercholesteremia   . Hx of completed stroke   . History of heart attack   . UTI (lower urinary tract infection)      Review of Systems  Constitutional: Negative for chills, fatigue and unexpected weight change.  HENT: Negative for postnasal drip, rhinorrhea and sinus pressure.   Respiratory: Negative for cough, shortness of breath and wheezing.   Cardiovascular: Negative for chest  pain, palpitations and leg swelling.       Objective:   Physical Exam Filed Vitals:   03/06/15 1001  BP: 124/66  Pulse: 97  Temp: 97.5 F (36.4 C)  TempSrc: Oral  Height: 5' 3"  (1.6 m)  Weight: 140 lb (63.504 kg)  SpO2: 100%   RA  Gen: well appearing, no acute distress HEENT: NCAT, EOMi, OP clear PULM: Clear to auscultation bilaterally, normal effort CV: RRR, no mgr, no JVD AB: BS+, soft, nontender, Ext: warm, no edema, no clubbing, no cyanosis Derm: no rash or skin breakdown Neuro: A&Ox4, MAEW  Chest x-ray from 12/17/2014 images reviewed> emphysema but otherwise normal lung parenchyma     Assessment & Plan:   Fatigue This is a new problem to me and has been worsening recently. She is to undergo a cardiac evaluation next week which I agree with. However, she has no chest pain and she is had a heart catheterization the past which showed no evidence of significant coronary disease so I'm hopeful that these tests are going to be fine. The problem is complicated by hot flashes and flushing so I do wonder about hyperthyroidism. Occasionally that can cause fatigue in older adults rather than increased energy.  Plan: Follow-up cardiac workup Check TSH   COPD, severe She has severe recurrent exacerbations (more than 2 in the  last 6 months) which is a poor prognostic sign and can cause a more rapid decline in her lung function and is associated with an increased mortality. We have looked for evidence of atypical infection. She grew out Pseudomonas which can be seen in patients with COPD. Today I re-reviewed the images from her July 2015 CT chest which did not show bronchiectasis. A recent cystic fibrosis test was sent by my partner and was negative.  I also reviewed the records from the recent allergy visit where lab tests showed no evidence of a significant allergy. Fortunately her serum IgE and serum eosinophil counts have significantly improved.  Plan: Start daily  azithromycin to prevent exacerbations of COPD, the risks of hearing loss as well as a very small risk of arrhythmia and liver function abnormalities was discussed today EKG today to rule out conduction abnormality prior to starting azithromycin She will need quarterly EKGs and liver function tests to monitor for liver toxicity Continue Spiriva daily     Updated Medication List Outpatient Encounter Prescriptions as of 03/06/2015  Medication Sig  . albuterol (ACCUNEB) 0.63 MG/3ML nebulizer solution Take 1 ampule by nebulization every 6 (six) hours as needed for wheezing.  Marland Kitchen albuterol (PROVENTIL HFA;VENTOLIN HFA) 108 (90 BASE) MCG/ACT inhaler Inhale 2 puffs into the lungs every 6 (six) hours as needed for wheezing or shortness of breath.  . ALPRAZolam (XANAX) 0.5 MG tablet Take 0.5 mg by mouth at bedtime as needed for anxiety.  Marland Kitchen aspirin 81 MG tablet Take 81 mg by mouth daily.  . budesonide-formoterol (SYMBICORT) 160-4.5 MCG/ACT inhaler Inhale 2 puffs into the lungs 2 (two) times daily.  . chlorthalidone (HYGROTON) 25 MG tablet Take 25 mg by mouth daily.  . Cholecalciferol (VITAMIN D) 2000 UNITS CAPS Take 1 capsule by mouth daily.  Marland Kitchen dextromethorphan-guaiFENesin (MUCINEX DM) 30-600 MG per 12 hr tablet Take 1 tablet by mouth 2 (two) times daily.  . fluticasone (FLONASE) 50 MCG/ACT nasal spray Place 2 sprays into both nostrils daily.  Marland Kitchen lisinopril (PRINIVIL,ZESTRIL) 10 MG tablet Take 10 mg by mouth daily.  . montelukast (SINGULAIR) 10 MG tablet Take 10 mg by mouth at bedtime.  . Omega-3 Fatty Acids (FISH OIL) 1000 MG CAPS Take 1 capsule by mouth daily.  . potassium chloride SA (K-DUR,KLOR-CON) 20 MEQ tablet Take 20 mEq by mouth daily.  . simvastatin (ZOCOR) 40 MG tablet Take 40 mg by mouth daily.  Marland Kitchen Spacer/Aero-Holding Chambers (AEROCHAMBER MV) inhaler Use as instructed with inhaler.  DX 496.  . tiotropium (SPIRIVA) 18 MCG inhalation capsule Place 18 mcg into inhaler and inhale daily.  .  predniSONE (DELTASONE) 20 MG tablet Take 1 tablet (20 mg total) by mouth daily with breakfast. (Patient not taking: Reported on 03/06/2015)  . [DISCONTINUED] azithromycin (ZITHROMAX Z-PAK) 250 MG tablet As directed (Patient not taking: Reported on 03/06/2015)  . [DISCONTINUED] azithromycin (ZITHROMAX) 250 MG tablet Take 1 tablet (250 mg total) by mouth daily. Take 2 tablets (500 mg) on  Day 1,  followed by 1 tablet (250 mg) once daily on Days 2 through 5.  . [DISCONTINUED] azithromycin (ZITHROMAX) 250 MG tablet Take 2 tabs on  Day 1,  followed by 1 tab once daily on Days 2 through 5.  . [DISCONTINUED] levofloxacin (LEVAQUIN) 500 MG tablet Take 1 tablet (500 mg total) by mouth daily. (Patient not taking: Reported on 03/06/2015)  . [DISCONTINUED] predniSONE (DELTASONE) 10 MG tablet Take 6m X3 days, then 371mX3 days, then 2075m2 days, then 36m66m  days, then stop. (Patient not taking: Reported on 03/06/2015)

## 2015-03-06 NOTE — Assessment & Plan Note (Signed)
This is a new problem to me and has been worsening recently. She is to undergo a cardiac evaluation next week which I agree with. However, she has no chest pain and she is had a heart catheterization the past which showed no evidence of significant coronary disease so I'm hopeful that these tests are going to be fine. The problem is complicated by hot flashes and flushing so I do wonder about hyperthyroidism. Occasionally that can cause fatigue in older adults rather than increased energy.  Plan: Follow-up cardiac workup Check TSH

## 2015-03-06 NOTE — Assessment & Plan Note (Signed)
She has severe recurrent exacerbations (more than 2 in the last 6 months) which is a poor prognostic sign and can cause a more rapid decline in her lung function and is associated with an increased mortality. We have looked for evidence of atypical infection. She grew out Pseudomonas which can be seen in patients with COPD. Today I re-reviewed the images from her July 2015 CT chest which did not show bronchiectasis. A recent cystic fibrosis test was sent by my partner and was negative.  I also reviewed the records from the recent allergy visit where lab tests showed no evidence of a significant allergy. Fortunately her serum IgE and serum eosinophil counts have significantly improved.  Plan: Start daily azithromycin to prevent exacerbations of COPD, the risks of hearing loss as well as a very small risk of arrhythmia and liver function abnormalities was discussed today EKG today to rule out conduction abnormality prior to starting azithromycin She will need quarterly EKGs and liver function tests to monitor for liver toxicity Continue Spiriva daily

## 2015-03-06 NOTE — Telephone Encounter (Signed)
Spoke with Tiffany at [facility]. Per BQ's OV note, pt will take 1 tablet once daily to prevent further issues. Rx has been fixed. Nothing further was needed.

## 2015-03-07 ENCOUNTER — Telehealth: Payer: Self-pay

## 2015-03-07 ENCOUNTER — Other Ambulatory Visit (INDEPENDENT_AMBULATORY_CARE_PROVIDER_SITE_OTHER): Payer: Medicare PPO

## 2015-03-07 DIAGNOSIS — Z5181 Encounter for therapeutic drug level monitoring: Secondary | ICD-10-CM | POA: Diagnosis not present

## 2015-03-07 LAB — HEPATIC FUNCTION PANEL
ALBUMIN: 4.3 g/dL (ref 3.5–5.2)
ALT: 39 U/L — AB (ref 0–35)
AST: 30 U/L (ref 0–37)
Alkaline Phosphatase: 67 U/L (ref 39–117)
BILIRUBIN TOTAL: 0.9 mg/dL (ref 0.2–1.2)
Bilirubin, Direct: 0.2 mg/dL (ref 0.0–0.3)
TOTAL PROTEIN: 7.3 g/dL (ref 6.0–8.3)

## 2015-03-07 LAB — TSH: TSH: 1.37 u[IU]/mL (ref 0.35–4.50)

## 2015-03-07 NOTE — Telephone Encounter (Signed)
error 

## 2015-03-08 ENCOUNTER — Other Ambulatory Visit: Payer: Self-pay

## 2015-03-08 ENCOUNTER — Inpatient Hospital Stay
Admit: 2015-03-08 | Disposition: A | Discharge status: Home / Self Care | Payer: Self-pay | Attending: Internal Medicine | Admitting: Internal Medicine

## 2015-03-08 DIAGNOSIS — R062 Wheezing: Secondary | ICD-10-CM | POA: Diagnosis not present

## 2015-03-08 DIAGNOSIS — J441 Chronic obstructive pulmonary disease with (acute) exacerbation: Secondary | ICD-10-CM

## 2015-03-08 DIAGNOSIS — R0602 Shortness of breath: Secondary | ICD-10-CM | POA: Diagnosis not present

## 2015-03-08 LAB — CBC
HCT: 38.9 % (ref 35.0–47.0)
HGB: 13.3 g/dL (ref 12.0–16.0)
MCH: 31.2 pg (ref 26.0–34.0)
MCHC: 34.2 g/dL (ref 32.0–36.0)
MCV: 91 fL (ref 80–100)
Platelet: 250 10*3/uL (ref 150–440)
RBC: 4.26 10*6/uL (ref 3.80–5.20)
RDW: 13.5 % (ref 11.5–14.5)
WBC: 11.3 10*3/uL — ABNORMAL HIGH (ref 3.6–11.0)

## 2015-03-08 LAB — BASIC METABOLIC PANEL
Anion Gap: 12 (ref 7–16)
BUN: 13 mg/dL
Calcium, Total: 9.5 mg/dL
Chloride: 90 mmol/L — ABNORMAL LOW
Co2: 26 mmol/L
Creatinine: 0.82 mg/dL
EGFR (African American): >60
GLUCOSE: 124 mg/dL — AB
Potassium: 3.5 mmol/L
SODIUM: 128 mmol/L — AB

## 2015-03-08 LAB — TROPONIN I: Troponin-I: <0.03 ng/mL

## 2015-03-09 LAB — CBC WITH DIFFERENTIAL/PLATELET
Basophil #: 0 x10 3/mm 3
Basophil %: 0.1 %
Eosinophil #: 0 x10 3/mm 3
Eosinophil %: 0.1 %
HCT: 37 %
HGB: 12.7 g/dL
Lymphocyte %: 7 %
Lymphs Abs: 0.7 x10 3/mm 3 — ABNORMAL LOW
MCH: 31.3 pg
MCHC: 34.2 g/dL
MCV: 92 fL
Monocyte #: 0.1 "x10 3/mm " — ABNORMAL LOW
Monocyte %: 1.5 %
Neutrophil #: 8.8 x10 3/mm 3 — ABNORMAL HIGH
Neutrophil %: 91.3 %
Platelet: 241 x10 3/mm 3
RBC: 4.04 X10 6/mm 3
RDW: 13.9 %
WBC: 9.6 x10 3/mm 3

## 2015-03-09 LAB — BASIC METABOLIC PANEL
Anion Gap: 14 (ref 7–16)
BUN: 10 mg/dL
CHLORIDE: 94 mmol/L — AB
CO2: 23 mmol/L
CREATININE: 0.91 mg/dL
Calcium, Total: 8.8 mg/dL — ABNORMAL LOW
EGFR (African American): >60
GLUCOSE: 203 mg/dL — AB
Potassium: 3.1 mmol/L — ABNORMAL LOW
Sodium: 131 mmol/L — ABNORMAL LOW

## 2015-03-10 LAB — BASIC METABOLIC PANEL
ANION GAP: 7 (ref 7–16)
BUN: 13 mg/dL
CO2: 26 mmol/L
Calcium, Total: 8.6 mg/dL — ABNORMAL LOW
Chloride: 103 mmol/L
Creatinine: 0.79 mg/dL
EGFR (Non-African Amer.): >60
Glucose: 117 mg/dL — ABNORMAL HIGH
Potassium: 3.6 mmol/L
SODIUM: 136 mmol/L

## 2015-03-10 LAB — CBC WITH DIFFERENTIAL/PLATELET
Bands: 2 %
HCT: 35 % (ref 35.0–47.0)
HGB: 11.8 g/dL — ABNORMAL LOW (ref 12.0–16.0)
LYMPHS PCT: 9 %
MCH: 30.9 pg (ref 26.0–34.0)
MCHC: 33.8 g/dL (ref 32.0–36.0)
MCV: 91 fL (ref 80–100)
MYELOCYTE: 1 %
Metamyelocyte: 2 %
Monocytes: 9 %
PLATELETS: 241 10*3/uL (ref 150–440)
RBC: 3.83 10*6/uL (ref 3.80–5.20)
RDW: 13.6 % (ref 11.5–14.5)
Segmented Neutrophils: 77 %
WBC: 15.1 10*3/uL — ABNORMAL HIGH (ref 3.6–11.0)

## 2015-03-10 NOTE — Consult Note (Signed)
Present Illness 69 year old female with known coronary artery disease by cardiac catheterization in 2009.  The patient has had some previous myocardial infarction due to takatsubo syndrome.  She has been on appropriate medication since that time and has done fairly well from a cardiac standpoint without evidence of significant congestive heart failure or angina.  She does have chronic shortness of breath from COPD which was exacerbated recently with a possible new infection.  The patient has had some hypoxemia and severe shortness of breath for which she is had admission to the hospital.  After inhalers.  The patient has significantly improved her symptoms without evidence of further chest discomfort.  She did have an EKG showing normal sinus rhythm.  The patient did have elevated of troponin of 0.15, possibly consistent with demand ischemia, hypoxia.  The patient does have hypertension and hyperlipidemia, previously well controlled on current medical regimen  Family history Multiple sisters with coronary artery disease  Social history Patient has remote tobacco use and occasionally drinks alcohol   Physical Exam:   GEN WD    NECK No masses    RESP wheezing  rhonchi    CARD Regular rate and rhythm    ABD denies tenderness  soft    LYMPH negative neck    EXTR negative cyanosis/clubbing    SKIN No rashes    NEURO cranial nerves intact    PSYCH alert   Review of Systems:   Subjective/Chief Complaint I'm very short of breath    Respiratory: Short of breath    Review of Systems: All other systems were reviewed and found to be negative    Medications/Allergies Reviewed Medications/Allergies reviewed     Myocardial Infarction:    asthma:    hypercholesterolemia:    "reconstructive suregery on female organs":    breast surgery:   Home Medications:  Spiriva 18 mcg inhalation capsule: 1 cap(s) inhaled once a day , Active  lisinopril 20 mg oral tablet: 1 tab(s) orally  once a day, Active  simvastatin 40 mg oral tablet: 1 tab(s) orally once a day (at bedtime), Active  alprazolam 0.5 mg oral tablet: tab(s) orally 2 times a day, As Needed- for Anxiety, Nervousness , Active  alendronate 70 mg oral tablet: 1 tab(s) orally once a week, Active  Advair Diskus 250 mcg-50 mcg inhalation powder: 1 puff(s) inhaled 2 times a day, Active  Coreg 12.5 mg oral tablet: 1 tab(s) orally 2 times a day, Active  omeprazole 20 mg oral delayed release capsule: 1 cap(s) orally once a day, Active  aspirin 81 mg oral tablet: 1 tab(s) orally once a day, Active  Astepro 205.5 mcg/inh (0.15%) nasal spray: 1 spray(s) nasal once a day, Active  hydrochlorothiazide 12.5 mg oral tablet: 1 tab(s) orally once a day, Active  Fish Oil 1000 mg oral capsule: 1 cap(s) orally once a day, Active  Tylenol Caplet Extra Strength 500 mg oral tablet: 2 tab(s) orally once a day, As Needed- for pain, Active  Tylenol Extra Strength PM 500 mg-25 mg oral tablet: 1 tab(s) orally once a day (at bedtime), Active  Sominex:  orally once (at bedtime), As Needed, Active  Benadryl 25 mg oral tablet: tab(s) orally once (at bedtime) for sleep, Active  Routine Chem:  19-Jan-13 12:41    B-Type Natriuretic Peptide ([facility]) 512  Routine Coag:  19-Jan-13 12:41    D-Dimer, Quantitative 0.30  Routine Hem:  19-Jan-13 12:41    WBC (CBC) 9.1   RBC (CBC) 3.72  Hemoglobin (CBC) 11.9   Hematocrit (CBC) 35.5   Platelet Count (CBC) 167   MCV 96   MCH 32.1   MCHC 33.6   RDW 13.4  Routine Chem:  19-Jan-13 12:41    Glucose, Serum 113   BUN 7   Creatinine (comp) 0.75   Sodium, Serum 140   Potassium, Serum 4.1   Chloride, Serum 103   CO2, Serum 24   Calcium (Total), Serum 9.0  Hepatic:  19-Jan-13 12:41    Bilirubin, Total 1.0   Alkaline Phosphatase 65   SGPT (ALT) 63   SGOT (AST) 67   Total Protein, Serum 7.7   Albumin, Serum 4.2  Routine Chem:  19-Jan-13 12:41    Osmolality (calc) 278   eGFR (African American)  >60   eGFR (Non-African American) >60   Anion Gap 13  Cardiac:  19-Jan-13 12:41    Troponin I 0.15  Routine Coag:  19-Jan-13 12:41    Activated PTT (APTT) < 23.0  Routine Micro:  19-Jan-13 19:46    Specimen Source SWAB  Cardiac:  19-Jan-13 21:49    Troponin I 0.11  Routine Coag:  19-Jan-13 21:49    Activated PTT (APTT) 90.2  Cardiac:  19-Jan-13 21:49    CPK-MB, Serum 1.9  Routine Hem:  20-Jan-13 03:58    WBC (CBC) 6.8   RBC (CBC) 3.54   Hemoglobin (CBC) 11.3   Hematocrit (CBC) 33.5   Platelet Count (CBC) 155   MCV 95   MCH 32.0   MCHC 33.8   RDW 13.4  Routine Chem:  20-Jan-13 03:58    Glucose, Serum 153   BUN 10   Creatinine (comp) 0.78   Sodium, Serum 142   Potassium, Serum 3.6   Chloride, Serum 102   CO2, Serum 25   Calcium (Total), Serum 8.6   Osmolality (calc) 285   eGFR (African American) >60   eGFR (Non-African American) >60   Anion Gap 15  Routine Coag:  20-Jan-13 03:58    Activated PTT (APTT) 130.5  Routine Hem:  20-Jan-13 03:58    Neutrophil % 83.0   Lymphocyte % 14.6   Monocyte % 1.6   Eosinophil % 0.5   Basophil % 0.3   Neutrophil # 5.7   Lymphocyte # 1.0   Monocyte # 0.1   Eosinophil # 0.0   Basophil # 0.0  Cardiac:  20-Jan-13 05:58    Troponin I 0.07   CPK-MB, Serum 1.8   CK, Total 70   EKG:   EKG Interp. by me    Interpretation normal sinus rhythm, normal EKG    No Known Allergies:   Vital Signs/Nurse's Notes: **Vital Signs.:   20-Jan-13 05:30   Vital Signs Type Routine   Temperature Temperature (F) 97.9   Celsius 36.6   Temperature Source oral   Pulse Pulse 83   Pulse source per Dinamap   Respirations Respirations 18   Systolic BP Systolic BP 878   Diastolic BP (mmHg) Diastolic BP (mmHg) 75   Mean BP 89   BP Source Dinamap   Pulse Ox % Pulse Ox % 97   Pulse Ox Activity Level  At rest   Oxygen Delivery 2L     Impression 69 year old female with known coronary artery disease, hypertension, hyperlipidemia,  previous myocardial infarction, remote tobacco use with COPD exacerbation with asthma and elevated troponin, possibly consistent with demand ischemia    Plan 1.  Continue aggressive treatment of acute exacerbation of asthma and COPD with hypoxia. 2.  Heparin for 24 hours for possible myocardial ischemia or demand ischemia. 3.  Echocardiogram for LV systolic dysfunction, valvular heart disease contrarily to above symptoms. 4.  Continue current medical regimen for further risk reduction including hypertension, hyperlipidemia, without change today. 5.  Further diagnostic testing and adjustments of medication management, as patient ambulates over the next 24 hours   Electronic Signatures: Corey Skains (MD)  (Signed 20-Jan-13 08:39)  Authored: General Aspect/Present Illness, History and Physical Exam, Review of System, Past Medical History, Home Medications, Labs, EKG , Allergies, Vital Signs/Nurse's Notes, Impression/Plan   Last Updated: 20-Jan-13 08:39 by Corey Skains (MD)

## 2015-03-10 NOTE — Discharge Summary (Signed)
PATIENT NAME:  Lori Brady, Lori Brady MR#:  [id number] DATE OF BIRTH:  [birth year]  DATE OF ADMISSION:  12/05/2011 DATE OF DISCHARGE:  12/08/2011  ADMITTING DIAGNOSIS: Shortness of breath.   DISCHARGE DIAGNOSES:  1. Shortness of breath due to acute respiratory failure as a result of chronic obstructive pulmonary disease exacerbation.  2. Acute on chronic obstructive pulmonary disease exacerbation, now improved.  3. Minimally elevated troponin, likely demand ischemia, status post evaluation by cardiology and echocardiogram, recommended outpatient follow up. No further workup needed.  4. Hypertension.  5. Anxiety.  6. History of Takotsubo cardiomyopathy with ejection fraction of 40% in 2009.  7. History of cerebrovascular accident with right-sided hemiplegia with minimal right arm weakness.  8. Hyperlipidemia.  9. Crohn's disease.  10. Status post right arm Colles fracture repair.  11. Breast lumps removed.  12. Ovarian cyst, removed.   13. Bladder ureter construction surgery.   CONSULTANT: Dr. Nehemiah Massed.   LABORATORY, DIAGNOSTIC AND RADIOLOGICAL DATA: Admitting WBC 9.1, hemoglobin 11.9, hematocrit 35.2, platelet count 167, sodium 140, potassium 4.1, chloride 103, bicarbonate 24, BUN 7, creatinine 0.76, glucose 113, calcium 9.0, ALT 63, AST 67, alkaline phosphatase 65, total bilirubin 1.0, albumin 4.2, troponin 0.15, d-dimer 0.3. CT per PE shows multiple tiny densities noted throughout the trachea and rhonchi consistent with small collection of mucus. No other abnormality. EKG showed normal sinus rhythm with a heart rate of 78. No ST-T wave changes. Patient's subsequent troponin was 0.11 and 0.07. 2-D echo showed normal ejection fraction, mild mitral and tricuspid regurgitation.   HOSPITAL COURSE: Please see history and physical done by the admitting physician. Patient is a 69 year old white female with history of asthma/chronic obstructive pulmonary disease, hypertension, history of non-ST myocardial  infarction in the past with history of Takotsubo cardiomyopathy diagnosed in 2009 who presented with three days of shortness of breath. Patient had flulike symptoms in November and she recovered and was doing okay until she started having congested feeling over the last few days. Patient came to the ED with shortness of breath. Did not have any chest pain. Noted to have slightly elevated troponin. She was admitted for acute chronic obstructive pulmonary disease exacerbation. Patient was treated with nebulizers, IV steroids. In terms of her cardiac enzymes being elevated she was started on heparin. She was seen by cardiology. She underwent echocardiogram. She was felt to have some demand ischemia. Cardiology followed her throughout the hospitalization, recommended outpatient follow up at this time, no intervention was needed. Her shortness of breath is improved after treatment of her acute chronic obstructive pulmonary disease exacerbation. Patient currently is on room air and ambulating without any significant symptoms. She is currently stable for discharge.   DISCHARGE MEDICATIONS:  1. Spiriva 18 mcg daily.  2. Lisinopril 20 daily.  3. Simvastatin 40 daily.  4. Alprazolam 0.5, 1 tab p.o. b.i.d. as needed.  5. Alendronate 70 mg once weekly.  6. Advair 250/50, 1 puff b.i.d.  7. Coreg 12.5, 1 tab p.o. b.i.d.  8. Omeprazole 20 daily.  9. Aspirin 81, 1 tab p.o. daily.  10. Astepro nasal spray to each nostril daily.  11. Hydrochlorothiazide 12.5 daily.  12. Fish oil 1000 mg 1 tab p.o. daily.  13. Tylenol Extra Strength 2 tabs daily.  14. Tylenol PM as needed at bedtime. 15. Sominex 1 tab p.o. at bedtime as needed.  16. Benadryl 25 at bedtime p.r.n. for sleep. 17. Combivent metered-dose inhaler 2 to 4 puffs q.6 p.r.n.  18. Prednisone taper 40 mg p.o. daily  x2 days, 30 mg p.o. daily x2 days, 10 mg p.o. daily x2 days.   DIET: Low sodium.   ACTIVITY: As tolerated.   TIMEFRAME FOR FOLLOW UP: 1 to 2  weeks with Dr. Margarita Rana, primary physician; Dr. Raul Del as scheduled; Dr. Nehemiah Massed. Patient to call for appointment.  TIME SPENT: 35 minutes.   ____________________________ Lafonda Mosses Posey Pronto, MD shp:cms D: 12/08/2011 11:15:19 ET T: 12/09/2011 10:20:50 ET JOB#: [id number]  cc: Madisson Kulaga H. Posey Pronto, MD, <Dictator> Jerrell Belfast, MD  Alric Seton MD ELECTRONICALLY SIGNED 01/02/2012 9:55

## 2015-03-10 NOTE — H&P (Signed)
PATIENT NAME:  Lori Brady, Lori Brady MR#:  [id number] DATE OF BIRTH:  25-Mar-1946  DATE OF ADMISSION:  12/05/2011  ADMITTING PHYSICIAN: Dr. Gladstone Lighter.  PRIMARY CARE PHYSICIAN: Dr. Margarita Rana. PRIMARY PULMONOLOGIST: Dr. Raul Del.   CHIEF COMPLAINT: Difficulty breathing.   HISTORY OF PRESENT ILLNESS: Ms. First is a 69 year old Caucasian female with past medical history significant for asthma, chronic obstructive pulmonary disease, hypertension, and history of NSTEMI in 2009, history of cerebrovascular accident in 2007, who comes to the hospital complaining of three day history of difficulty breathing, just got worse today. The patient said that she had flulike symptoms in late November though she was tested negative for flu. Since then she recovered. She has been doing well except for feeling congested over the last week because of weather changes. She has been having some trouble breathing over the last three days. No chest pain, orthopnea, or PND. Just dry cough overnight and this morning she felt bad so had to call EMS to bring her to the hospital. She was saturating well, 96% on 2 liters. Blood pressure was elevated at 200/94 and she had a slight bump of troponin at 0.15, so she is being admitted for NSTEMI and also chronic obstructive pulmonary disease exacerbation.   PAST MEDICAL HISTORY:  1. History of NSTEMI in 2009. Catheterization at that time showed nonobstructive coronary artery disease with 40% of right PDA, 50% of mid RCA, 30% of mid LAD lesions with ejection fraction of 40%.  2. Takotsubo cardiomyopathy diagnosed with ejection fraction 40% in 2009.  3. History of cerebrovascular accident with right-sided hemiplegia recovering with minimal right arm weakness in 2007.  4. Chronic bronchitis and chronic obstructive pulmonary disease.  5. Asthma.  6. Hyperlipidemia.  7. Hypertension.  8. Crohn's disease.   PAST SURGICAL HISTORY:  1. Right arm Colles fracture repair.  2. Breast lumps  removal in the past.  3. Ovarian cyst removal.  4. Bladder ureteral construction surgery.  5. Cardiac catheterization.   ALLERGIES: No known drug allergies.   HOME MEDICATIONS:  1. Spiriva one inhalation daily.  2. Vitamin D3 2000 international units daily.  3. Furosemide 70 mg p.o. weekly on Wednesday.  4. Prilosec 20 mg p.o. daily.  5. Xanax 0.25 mg p.o. b.i.d. p.r.n.  6. Coreg 12.5 mg p.o. b.i.d.  7. Fish oil 1 gram capsule p.o. daily.  8. Aspirin 81 mg p.o. daily. 9. Simvastatin 40 mg p.o. daily.  10. Proventil inhaler as needed.  11. Lisinopril 20 mg p.o. daily.  12. HCTZ 25 mg p.o. daily.  13. Advair 250/50 one puff b.i.d.   SOCIAL HISTORY: He lives at home with sister. Occasional beer drinking, but otherwise no alcohol abuse. Quit smoking about three years ago.   FAMILY HISTORY: Dad died with pancreatic cancer in 10s. Mom had liver and gallbladder cancer.   REVIEW OF SYSTEMS: CONSTITUTIONAL: No fever, fatigue, or weakness. EYES: No blurred vision or double vision. Uses reading glasses. No glaucoma or cataracts. ENT: Decreased hearing in right ear secondary to the stroke. No tinnitus, ear pain, epistaxis or discharge. RESPIRATORY: Positive for dry cough and wheeze. Positive for chronic obstructive pulmonary disease and asthma. No hemoptysis. CARDIOVASCULAR: No chest pain, orthopnea, edema, arrhythmias, palpitations, or syncope. GASTROINTESTINAL: No nausea, vomiting, diarrhea, abdominal pain, hematemesis or melena. GENITOURINARY: No dysuria, hematuria, renal calculus, frequency, or incontinence. ENDOCRINE: No polyuria, nocturia, thyroid problems, heat or cold intolerance. HEMATOLOGY: No anemia, easy bruising or bleeding. SKIN: No acne, rash, or lesions. MUSCULOSKELETAL: No neck, back,  shoulder pain, arthritis, or gout. NEUROLOGIC: Positive for history of stroke with minimal right residual right arm weakness. No seizure history. PSYCHOLOGICAL: No anxiety, insomnia, or depression.    PHYSICAL EXAMINATION:  VITAL SIGNS: Temperature 98.4 degrees Fahrenheit, pulse 82, respirations 28, blood pressure 200/94, pulse oximetry 96% on 2 liters oxygen.   GENERAL: Well built, well nourished female sitting in bed, not in any acute distress.   HEENT: Normocephalic, atraumatic. Pupils equal, round, reacting to light. Anicteric sclerae. Extraocular movements intact. Oropharynx clear without erythema, mass or exudates.   NECK: Supple. No thyromegaly, jugular venous distention or carotid bruits. No lymphadenopathy.   LUNGS: Clear to auscultation with scattered diffuse wheezes most prominent posteriorly. No use of accessory muscles for breathing. No rhonchi or weight.   CARDIOVASCULAR: S1, S2 regular rate and rhythm. No murmurs, rubs, or gallops.   ABDOMEN: Soft, nontender, nondistended. No hepatosplenomegaly. Normal bowel sounds.  EXTREMITIES: No pedal edema. No clubbing or cyanosis. 2+ dorsalis pedis pulses palpable bilaterally.   SKIN: No acne, rash, or lesions.   NEUROLOGIC: Cranial nerves intact. No focal motor or sensory deficits. Slight decrease in the right arm grip.  PSYCHOLOGICAL: The patient is awake, alert, oriented x3.   LABORATORY, DIAGNOSTIC, AND RADIOLOGICAL DATA: WBC 9.1, hemoglobin 11.9, hematocrit 35.2, platelet count 167. Sodium 140, potassium 4.1, chloride 103, bicarbonate 24, BUN 7, creatinine 0.76, glucose 113, calcium 9.0. ALT 63, AST 67, alkaline phosphatase 65, total bilirubin 1.0, albumin 4.2. Troponin 0.15 first set. D-dimer 0.3. BNP slightly elevated at 512. Chest x-ray showing minimal atelectasis at right lung base. Otherwise clear lung fields. CT of the chest with contrast showing no evidence of any PE. Multiple tiny densities noted throughout trachea and bronchi consistent with small collections of mucus. Mild pleural parenchymal scarring in both upper lobes. Fatty replacement of the liver. EKG showing normal sinus rhythm, heart rate of 78. No acute ST-T  wave abnormalities present.   ASSESSMENT AND PLAN:  This is a 69 year old female with past medical history of coronary artery disease with history of NSTEMI in the year 2009. Catheterization showing nonobstructing diffuse disease, ejection fraction of 40%, chronic obstructive pulmonary disease, asthma, admitted for dyspnea and found to have elevated troponin.  1. NSTEMI. Admit to telemetry. IV heparin. Cardiology consult with Dr. Nehemiah Massed. Continue aspirin, nitroglycerin, Coreg, Lisinopril and statin. Probably triggered by hypoxia, respiratory distress and malignant hypertension when she came in. Repeat trend of cardiac enzymes. Follow up echo at this time.  2. Chronic obstructive pulmonary disease exacerbation. Low dose Solu-Medrol b.i.d., nebs, Spiriva, Advair. We will hold off on starting any antibiotics at this time.  3. Hypertension. Continue home medications, lisinopril, Coreg and HCTZ. Also, add IV hydralazine p.r.n.  4. Anxiety. Continue Xanax p.r.n.  5. Gastrointestinal and deep vein thrombosis prophylaxis. On Prilosec and also IV heparin drip.  6. CODE STATUS: FULL CODE.   TIME SPENT ON ADMISSION: 50 minutes.  ____________________________ Gladstone Lighter, MD rk:ap D: 12/05/2011 17:21:24 ET T: 12/06/2011 07:48:44 ET JOB#: [id number]  cc: Gladstone Lighter, MD, <Dictator> Jerrell Belfast, MD Herbon E. Raul Del, MD Corey Skains, MD Gladstone Lighter MD ELECTRONICALLY SIGNED 12/06/2011 13:47

## 2015-03-14 ENCOUNTER — Other Ambulatory Visit (INDEPENDENT_AMBULATORY_CARE_PROVIDER_SITE_OTHER): Payer: Medicare PPO

## 2015-03-14 DIAGNOSIS — J441 Chronic obstructive pulmonary disease with (acute) exacerbation: Secondary | ICD-10-CM | POA: Diagnosis not present

## 2015-03-14 LAB — HEPATIC FUNCTION PANEL
ALK PHOS: 60 U/L (ref 39–117)
ALT: 35 U/L (ref 0–35)
AST: 29 U/L (ref 0–37)
Albumin: 4.3 g/dL (ref 3.5–5.2)
BILIRUBIN DIRECT: 0.1 mg/dL (ref 0.0–0.3)
Total Bilirubin: 0.4 mg/dL (ref 0.2–1.2)
Total Protein: 6.7 g/dL (ref 6.0–8.3)

## 2015-03-17 NOTE — Discharge Summary (Signed)
PATIENT NAME:  Lori Brady, Lori Brady MR#:  [id number] DATE OF BIRTH:  1946/04/11  DATE OF ADMISSION:  03/08/2015 DATE OF DISCHARGE:  03/10/2015  ADMITTING COMPLAINT: Shortness of breath.   DISCHARGE DIAGNOSES:  1.  Chronic obstructive pulmonary disease exacerbation.  2.  Hypertension.  3.  Hyponatremia.   4.  Hyperlipidemia.   CONSULTATIONS: None.   PROCEDURES: Chest x-ray, April 22, showed no active cardiopulmonary disease.   HISTORY OF PRESENT ILLNESS: This 69 year old Caucasian female with past medical history of COPD, non-oxygen dependent; hypertension, presents with shortness of breath. She describes a 1 day duration of shortness of breath with associated cough. No sputum production. Of note, she states she has about 1 week total duration of URI-like symptoms, feeling generally weak with subjective chills and presented to the hospital for further workup and evaluation.   HOSPITAL COURSE BY PROBLEM:  1.  Acute exacerbation of COPD: She is not on oxygen at home. She was treated with standard regimen of supplemental oxygen, Solu-Medrol, nebulizer treatments and home inhalers were continued throughout the hospitalization. She did very well and actually only needed supplemental oxygenation in the Emergency Room. On the floor, she did well on room air even with exertion. She was fairly tachycardic initially, likely due to COPD exacerbation, as well as albuterol and steroids. This improved during the hospitalization, but her heart rate still rose with exertion. She is discharged on her home regimen as well as a short course of prednisone and azithromycin.  2.  Hyponatremia: Her presenting sodium was 128. This improved with IV fluids and also holding diuretics. On discharge, I have discontinued chlorthalidone, as this is a medication known to contribute to hyponatremia. For better blood pressure control, I have increased lisinopril. On discharge her sodium is normal at 136.  3.  Hypertension: As mentioned  above, I have increased her lisinopril to 20 mg daily to control blood pressure and discontinued chlorthalidone due to hyponatremia. She will follow up with her primary care physician on this issue in the outpatient setting.  4.  Hyperlipidemia: She continues on statin therapy.   DISCHARGE PHYSICAL EXAMINATION:  VITAL SIGNS: Temperature 97.2, pulse 103, respirations 18, blood pressure 152/79, oxygenation 98% on room air.  GENERAL: No acute distress.  RESPIRATORY: Lungs clear to auscultation bilaterally with good air movement. No wheezes, rhonchi, or rales.  CARDIOVASCULAR: Regular rate and rhythm. No murmurs, rubs, or gallops. No peripheral edema. Peripheral pulses 2+.  PSYCHIATRIC: She is alert and oriented x 4 with good insight into her clinical condition.   LABORATORY DATA: Sodium 136, potassium 3.6, chloride 103, bicarbonate 26, BUN 13, creatinine 0.79, glucose 117. Troponin was less than 0.03 on admission and this was not repeated. White blood cells 15.1 due to steroid effect, hemoglobin 11.8, platelets 241,000, MCV is 91.   DISCHARGE MEDICATIONS:  1.  Alendronate 70 mg 1 tablet once a week on Wednesdays.  2.  Ventolin HFA CFC free 90 mcg/inhalation 2 puffs inhaled 4 times a day as needed for shortness of breath.  3.  Alprazolam 0.5 mg 1 tablet once a day at bedtime.  4.  Symbicort 160 mcg/1.5 mcg 2 puffs twice a day.  5.  Albuterol 2.5 mg/3 mL inhaled solution 3 mL inhaled every 6 hours as needed for shortness of breath.  6.  Montelukast 10 mg 1 tablet once a day in the morning.  7.  Fish oil 1000 mg 1 capsule once a day.  8.  Vitamin D3 at 2000 international units 1 tablet  once a day.  9.  Aspirin 81 mg 1 tablet once a day.  10.  Simvastatin 40 mg 1 tablet once a day.  11.  Spiriva 18 mcg/inhalation 1 capsule inhaled once a day in the morning.  12.  Azithromycin 250 mg 1 capsule once a day in the morning for 3 days and then stop.  13.  Lisinopril 20 mg 1 tablet once a day.  14.   Prednisone 50 mg 1 tablet once a day for 3 days and then stop.   CONDITION ON DISCHARGE: Stable discharge.   DISPOSITION: Discharged to home with no further home health needs.   DISCHARGE INSTRUCTIONS:  1.  Diet: Low-sodium, low-fat, low-cholesterol diet.  2.  Activity limitations: None.  3.  Timeframe for followup: Please follow up with your primary care physician in 1-2 weeks.  TIME SPENT ON DISCHARGE: 40 minutes.    ____________________________ Earleen Newport. Volanda Napoleon, MD cpw:bm D: 03/12/2015 20:43:58 ET T: 03/13/2015 07:57:42 ET JOB#: [id number]  cc: Earleen Newport. Volanda Napoleon, MD, <Dictator> Jerrell Belfast, MD Aldean Jewett MD ELECTRONICALLY SIGNED 03/14/2015 7:34

## 2015-03-17 NOTE — H&P (Signed)
PATIENT NAME:  Lori Brady, Lori Brady MR#:  [id number] DATE OF BIRTH:  1946/06/27  DATE OF ADMISSION:  03/08/2015  REFERRING PHYSICIAN: Wells Guiles L. Reita Cliche, MD  PRIMARY CARE PHYSICIAN: Jerrell Belfast, MD  CHIEF COMPLAINT: Shortness of breath.   HISTORY OF PRESENT ILLNESS: A 69 year old Caucasian female with past medical history of COPD, non-oxygen dependent; hypertension, essential, presenting with shortness of breath. Describes 1-day duration of shortness of breath with associated cough, nonproductive of sputum as well as subjective chills. Of note, she states she had about 1 week total duration of URI-like symptoms, feeling generally weak with subjective chills, as stated above presented to the hospital for further workup and evaluation.   REVIEW OF SYSTEMS:  CONSTITUTIONAL: Denies fevers. Positive for chills, as stated above. Positive for weakness, fatigue. EYES: Denies blurry vision, double vision, eye pain. EAR, NOSE, AND THROAT: Denies tinnitus, ear pain, or hearing loss. RESPIRATORY: Positive for cough, shortness of breath, as stated above.  CARDIOVASCULAR: Denies chest pain, palpitations, edema.  GASTROINTESTINAL: Denies nausea, vomiting, diarrhea, or abdominal pain.  GENITOURINARY: No dysuria or hematuria.  ENDOCRINE: Denies nocturia or thyroid problems.  HEMATOLOGIC AND LYMPHATIC: Denies easy bruising or bleeding.  SKIN: Denies rashes or lesions.  MUSCULOSKELETAL: Denies pain in neck, back, shoulder, knees, hips, or arthritic symptoms.  NEUROLOGIC: Denies paralysis or paresthesias.  PSYCHIATRIC: Denies anxiety or depressive symptoms. Otherwise, a full review of systems performed by me is negative.    PAST MEDICAL HISTORY: Includes COPD, unspecified type, non-oxygen dependent; coronary artery disease without stent placement; hyperlipidemia, unspecified; history of CVA with minimal residual right-sided weakness.   SOCIAL HISTORY: Positive for remote tobacco use. Occasional alcohol use.  Denies any drug use.   FAMILY HISTORY: Positive for coronary artery disease.   ALLERGIES: VICODIN.   HOME MEDICATIONS: Include aspirin 81 mg p.o. daily, lisinopril 10 mg p.o. daily, simvastatin 40 mg p.o. at bedtime, alprazolam 0.5 mg p.o. at bedtime, alendronate 70 mg p.o. weekly on Wednesdays, albuterol nebulizer treatments every 6 hours as needed for shortness of breath, Spiriva 18 mcg inhalation daily, Symbicort 160/4.5 mcg inhalation 2 puffs b.i.d., Ventolin 90 mcg inhalation 2 puffs 4 times a day as needed for shortness of breath, chlorthalidone 25 mg p.o. daily, Singulair 10 mg p.o. daily, azithromycin 250 mg p.o. daily, potassium 20 mEq p.o. b.i.d., fish oil 1000 mg p.o. daily, vitamin D3 at 2000 international units p.o. daily.   PHYSICAL EXAMINATION:  VITAL SIGNS: Temperature 98.3, heart rate 110, respirations 19, blood pressure 139/74, saturating 96% on room air. Weight 63 kg, BMI 24.6.  GENERAL: A well-nourished, well-developed Caucasian female currently in no acute distress.  HEAD: Normocephalic, atraumatic.  EYES: Pupils equal, round, and reactive to light. Extraocular muscles intact. No scleral icterus.  MOUTH: Moist mucosal membrane. Dentition intact. No abscess noted.  EAR, NOSE, AND THROAT: Clear without exudates. No external lesions.  NECK: Supple. No thyromegaly. No nodules. No JVD.  PULMONARY: Expiratory wheezing heard throughout all lung fields. Minimal right basilar coarse rhonchi. Tachypneic without use of accessory muscles.  CHEST: Nontender to palpation.  CARDIOVASCULAR: S1 and S2, tachycardic. No murmurs, rubs, or gallops. No edema. Pedal pulses 2+ bilaterally.  GASTROINTESTINAL: Soft, nontender, nondistended. No masses. Positive bowel sounds. No hepatosplenomegaly.  MUSCULOSKELETAL: No swelling, clubbing, or edema. Range of motion full in all extremities.  NEUROLOGIC: Cranial nerves II-XII intact. No gross focal neurological deficits. Sensation intact. Reflexes intact.   SKIN: No ulceration, lesions, rash, or cyanosis. Skin warm, dry. Turgor intact. PSYCHIATRIC: Mood, affect within  normal limits. The patient is awake, alert, oriented x 3. Insight and judgment intact.   LABORATORY DATA: Chest x-ray performed which reveals no acute cardiopulmonary process. Remainder of laboratory data: Sodium of 128, potassium 3.5, chloride 90, bicarbonate 26, BUN 13, creatinine 0.82, glucose of 124. WBC 11.3, hemoglobin 13.3, platelets of 250,000.   ASSESSMENT AND PLAN: A 69 year old Caucasian female with a history of chronic obstructive pulmonary disease, non-oxygen dependent, unspecified type presenting with shortness of breath. 1.  Chronic obstructive pulmonary disease exacerbation. DuoNeb treatments q. 4 hours. Supplement oxygen to keep oxygen saturation greater than 92%. Solu-Medrol 60 mg intravenously daily. Continue with home medications including Spiriva and Symbicort.   2.  Hyponatremia. Hold diuretics. She has a 315 mEq sodium deficit. Intravenous fluid hydration with normal saline. Follow sodium level.  3.  Hyperlipidemia, unspecified type. Continue with statin therapy.  4.  Hypertension, essential. Continue ACE inhibitor. Hold diuretics, as stated above.  5.  Venous thromboembolism prophylaxis. Heparin subcutaneous.  CODE STATUS: The patient is a full code.  TIME SPENT: 45 minutes.    ____________________________ Aaron Mose. Hower, MD dkh:bm D: 03/08/2015 23:47:29 ET T: 03/09/2015 00:01:05 ET JOB#: [id number]  cc: Aaron Mose. Hower, MD, <Dictator> DAVID Woodfin Ganja MD ELECTRONICALLY SIGNED 03/10/2015 2:52

## 2015-03-27 DIAGNOSIS — R600 Localized edema: Secondary | ICD-10-CM | POA: Insufficient documentation

## 2015-04-11 ENCOUNTER — Ambulatory Visit
Admission: RE | Admit: 2015-04-11 | Discharge: 2015-04-11 | Disposition: A | Discharge status: Home / Self Care | Payer: Medicare PPO | Source: Ambulatory Visit | Attending: Family Medicine | Admitting: Family Medicine

## 2015-04-11 ENCOUNTER — Other Ambulatory Visit: Payer: Self-pay | Admitting: Family Medicine

## 2015-04-11 DIAGNOSIS — J449 Chronic obstructive pulmonary disease, unspecified: Secondary | ICD-10-CM | POA: Diagnosis not present

## 2015-04-11 DIAGNOSIS — R509 Fever, unspecified: Secondary | ICD-10-CM

## 2015-04-11 DIAGNOSIS — R05 Cough: Secondary | ICD-10-CM

## 2015-04-11 DIAGNOSIS — R059 Cough, unspecified: Secondary | ICD-10-CM

## 2015-04-16 ENCOUNTER — Telehealth: Payer: Self-pay | Admitting: Pulmonary Disease

## 2015-04-16 MED ORDER — ALBUTEROL SULFATE HFA 108 (90 BASE) MCG/ACT IN AERS: 2.0000 | INHALATION_SPRAY | Freq: Four times a day (QID) | RESPIRATORY_TRACT | Status: DC | PRN

## 2015-04-16 MED ORDER — BUDESONIDE-FORMOTEROL FUMARATE 160-4.5 MCG/ACT IN AERO: 2.0000 | INHALATION_SPRAY | Freq: Two times a day (BID) | RESPIRATORY_TRACT | Status: DC

## 2015-04-16 MED ORDER — TIOTROPIUM BROMIDE MONOHYDRATE 18 MCG IN CAPS: 18.0000 ug | ORAL_CAPSULE | Freq: Every day | RESPIRATORY_TRACT | Status: DC

## 2015-04-16 NOTE — Telephone Encounter (Signed)
Patient needs Spiriva, Symbicort and Ventolin.  90 day supply for each.  Patient says she will get a $140 discount if it is sent in as 90 days supply.  Sent to [facility].  Rx sent. Patient notified.Nothing further needed.

## 2015-04-23 ENCOUNTER — Other Ambulatory Visit: Payer: Self-pay

## 2015-04-23 DIAGNOSIS — F419 Anxiety disorder, unspecified: Secondary | ICD-10-CM

## 2015-04-23 MED ORDER — ALPRAZOLAM 0.5 MG PO TABS: 0.5000 mg | ORAL_TABLET | Freq: Two times a day (BID) | ORAL | Status: DC | PRN

## 2015-04-23 NOTE — Telephone Encounter (Signed)
Rx called in.   Thanks,   -Azaleah Usman  

## 2015-04-23 NOTE — Telephone Encounter (Signed)
Please call in rx for Xanax

## 2015-05-03 ENCOUNTER — Encounter: Payer: Self-pay | Admitting: Family Medicine

## 2015-05-03 ENCOUNTER — Other Ambulatory Visit: Payer: Self-pay

## 2015-05-03 ENCOUNTER — Ambulatory Visit (INDEPENDENT_AMBULATORY_CARE_PROVIDER_SITE_OTHER): Payer: Medicare PPO | Admitting: Family Medicine

## 2015-05-03 VITALS — BP 142/76 | HR 98 | Temp 97.9°F | Resp 18 | Ht 63.0 in | Wt 146.2 lb

## 2015-05-03 DIAGNOSIS — I10 Essential (primary) hypertension: Secondary | ICD-10-CM | POA: Insufficient documentation

## 2015-05-03 DIAGNOSIS — E875 Hyperkalemia: Secondary | ICD-10-CM | POA: Insufficient documentation

## 2015-05-03 DIAGNOSIS — J441 Chronic obstructive pulmonary disease with (acute) exacerbation: Secondary | ICD-10-CM | POA: Diagnosis not present

## 2015-05-03 MED ORDER — BUDESONIDE-FORMOTEROL FUMARATE 160-4.5 MCG/ACT IN AERO: 2.0000 | INHALATION_SPRAY | Freq: Two times a day (BID) | RESPIRATORY_TRACT | Status: DC

## 2015-05-03 MED ORDER — PREDNISONE 20 MG PO TABS: ORAL_TABLET | ORAL | Status: DC

## 2015-05-03 MED ORDER — CEFDINIR 300 MG PO CAPS: 300.0000 mg | ORAL_CAPSULE | Freq: Two times a day (BID) | ORAL | Status: DC

## 2015-05-03 NOTE — Progress Notes (Signed)
Subjective:     Patient ID: Lori Brady, female   DOB: 08/08/1946, 69 y.o.   MRN: [id number]  HPI  Chief Complaint  Patient presents with  . Cough    Patient presents in ofifce today with concerns of cough and congestion for the past 4 days and fever last night that was a high of 100.5. Patient has history of COPD and is concerned she states that she toolk OTC Ibuprofen for relief.   Reports sputum becoming thick and yellow and she is using her nebulizer more often. Was just treated in late May with doxycycline for a COPD exacerbation. States she had a brief period of illness. Reports her pulmonary doctor, Dr. Kendrick Fries only has office hours once a month locally. She does not wish to travel to [place].   Review of Systems  Constitutional: Positive for fever and chills.  HENT: Positive for congestion (clear drainage).        Objective:   Physical Exam  Constitutional: She appears well-developed and well-nourished. No distress.  HENT:  Right Ear: Tympanic membrane normal.  Left Ear: Tympanic membrane normal.  Mouth/Throat: Oropharynx is clear and moist. No oropharyngeal exudate.  Pulmonary/Chest: No respiratory distress (diminished breath sounds throughout).  Lymphadenopathy:    She has no cervical adenopathy.       Assessment:     1. COPD exacerbation  - cefdinir (OMNICEF) 300 MG capsule; Take 1 capsule (300 mg total) by mouth 2 (two) times daily.  Dispense: 20 capsule; Refill: 0 - predniSONE (DELTASONE) 20 MG tablet; Taper daily as follows: 3 pills for four days, two pills for four days, one pill for four days  Dispense: 24 tablet; Refill: 1    Plan:     Add expectorant

## 2015-05-03 NOTE — Patient Instructions (Signed)
Add expectorant like Robitussin or Mucinex

## 2015-05-31 DIAGNOSIS — J300 Vasomotor rhinitis: Secondary | ICD-10-CM | POA: Diagnosis not present

## 2015-05-31 DIAGNOSIS — J449 Chronic obstructive pulmonary disease, unspecified: Secondary | ICD-10-CM | POA: Diagnosis not present

## 2015-06-14 ENCOUNTER — Other Ambulatory Visit: Payer: Self-pay | Admitting: Family Medicine

## 2015-06-17 ENCOUNTER — Encounter: Payer: Self-pay | Admitting: Family Medicine

## 2015-06-17 ENCOUNTER — Ambulatory Visit (INDEPENDENT_AMBULATORY_CARE_PROVIDER_SITE_OTHER): Payer: Medicare PPO | Admitting: Family Medicine

## 2015-06-17 VITALS — BP 116/60 | HR 78 | Temp 98.3°F | Resp 16 | Wt 151.2 lb

## 2015-06-17 DIAGNOSIS — R509 Fever, unspecified: Secondary | ICD-10-CM

## 2015-06-17 DIAGNOSIS — J441 Chronic obstructive pulmonary disease with (acute) exacerbation: Secondary | ICD-10-CM

## 2015-06-17 MED ORDER — PREDNISONE 10 MG PO TABS: ORAL_TABLET | ORAL | Status: DC

## 2015-06-17 MED ORDER — DOXYCYCLINE HYCLATE 100 MG PO TABS: 100.0000 mg | ORAL_TABLET | Freq: Two times a day (BID) | ORAL | Status: DC

## 2015-06-17 NOTE — Progress Notes (Signed)
Patient: Lori Brady Female    DOB: Oct 17, 1946   69 y.o.   MRN: [id number] Visit Date: 06/17/2015  Today's Provider: [name], MD   Chief Complaint  Patient presents with  . Fever     started last night  . chest congestion  . COPD    Had two treatments before bedtime. Two treatments this morning   Subjective:    Fever  This is a new problem. The current episode started yesterday. The problem occurs 2 to 4 times per day. The problem has been gradually improving. The maximum temperature noted was 99 to 99.9 F. The temperature was taken using an oral thermometer. Associated symptoms include congestion (chest), coughing, headaches (had a little headache yesterday) and wheezing (a little). Pertinent negatives include no ear pain or sore throat. She has tried acetaminophen and fluids for the symptoms. The treatment provided mild relief.  Patient took two Albuterol 0.63 solution treatment in the nebulizer before bedtime and slept comfertable. Took two treatment this morning as well. Took Tylenol today at 12:00 pm.   Does not want to see Dr. Dema Severin. Dr Kendrick Fries is going to [facility].     Patient Active Problem List   Diagnosis Date Noted  . BP (high blood pressure) 05/03/2015  . Decreased potassium in the blood 05/03/2015  . Anxiety 04/23/2015  . Edema leg 03/27/2015  . Fatigue 03/06/2015  . Benign essential HTN 02/28/2015  . COPD exacerbation 10/01/2014  . Combined fat and carbohydrate induced hyperlipemia 09/06/2014  . COPD, severe 08/01/2014  . Acid reflux 08/01/2014  . Cough 08/01/2014   Allergies  Allergen Reactions  . Levaquin [Levofloxacin] Nausea Only  . Vicodin [Hydrocodone-Acetaminophen]     nausea   Previous Medications   ALBUTEROL (ACCUNEB) 0.63 MG/3ML NEBULIZER SOLUTION    Take 1 ampule by nebulization every 6 (six) hours as needed for wheezing.   ALBUTEROL (PROVENTIL HFA;VENTOLIN HFA) 108 (90 BASE) MCG/ACT INHALER    Inhale 2 puffs into the lungs every  6 (six) hours as needed for wheezing or shortness of breath.   ALENDRONATE (FOSAMAX) 70 MG TABLET    Take by mouth.   ALPRAZOLAM (XANAX) 0.5 MG TABLET    Take 1 tablet (0.5 mg total) by mouth 2 (two) times daily as needed for anxiety.   ASPIRIN 81 MG TABLET    Take 81 mg by mouth daily.   AZITHROMYCIN (ZITHROMAX) 250 MG TABLET    Take 1 tablet (250 mg total) by mouth daily.   BETAMETHASONE VALERATE (VALISONE) 0.1 % CREAM    APPLY TOPICALLY TO AFFECTED AREA SPARINGLY 2 TIMES DAILY.   BUDESONIDE-FORMOTEROL (SYMBICORT) 160-4.5 MCG/ACT INHALER    Inhale 2 puffs into the lungs 2 (two) times daily.   CHLORTHALIDONE (HYGROTON) 25 MG TABLET    Take 25 mg by mouth daily.   CHOLECALCIFEROL (VITAMIN D) 2000 UNITS CAPS    Take 1 capsule by mouth daily.   CHOLECALCIFEROL 1000 UNITS TABLET    Take by mouth.   DEXTROMETHORPHAN-GUAIFENESIN (MUCINEX DM) 30-600 MG PER 12 HR TABLET    Take 1 tablet by mouth 2 (two) times daily.   FLUTICASONE (FLONASE) 50 MCG/ACT NASAL SPRAY    Place 2 sprays into both nostrils daily.   LISINOPRIL (PRINIVIL,ZESTRIL) 10 MG TABLET    Take 10 mg by mouth daily.   MONTELUKAST (SINGULAIR) 10 MG TABLET    Take 10 mg by mouth at bedtime.   OMEGA-3 FATTY ACIDS (FISH OIL) 1000 MG  CAPS    Take 1 capsule by mouth daily.   OMEPRAZOLE (PRILOSEC) 10 MG CAPSULE    Take by mouth.   POTASSIUM CHLORIDE ER 20 MEQ TBCR    Take by mouth.   POTASSIUM CHLORIDE SA (K-DUR,KLOR-CON) 20 MEQ TABLET    Take 20 mEq by mouth daily.   SIMVASTATIN (ZOCOR) 40 MG TABLET    Take 40 mg by mouth daily.   SPACER/AERO-HOLDING CHAMBERS (AEROCHAMBER MV) INHALER    Use as instructed with inhaler.  DX 496.   TIOTROPIUM (SPIRIVA) 18 MCG INHALATION CAPSULE    Place 1 capsule (18 mcg total) into inhaler and inhale daily.   ZOLPIDEM (AMBIEN) 5 MG TABLET    Take by mouth.    Review of Systems  Constitutional: Positive for fever.  HENT: Positive for congestion (chest). Negative for ear pain and sore throat.   Respiratory:  Positive for cough and wheezing (a little).   Neurological: Positive for headaches (had a little headache yesterday).    History  Substance Use Topics  . Smoking status: Former Smoker -- 1.00 packs/day for 30 years    Types: Cigarettes    Quit date: 08/01/2008  . Smokeless tobacco: Never Used  . Alcohol Use: Yes     Comment: occasional   Objective:   BP 116/60 mmHg  Pulse 78  Temp(Src) 98.3 F (36.8 C) (Oral)  Resp 16  Wt 151 lb 3.2 oz (68.584 kg)  Physical Exam  Constitutional: She is oriented to person, place, and time. She appears well-developed and well-nourished.  HENT:  Head: Normocephalic and atraumatic.  Eyes: Conjunctivae are normal. Pupils are equal, round, and reactive to light.  Neck: Normal range of motion. Neck supple.  Cardiovascular: Normal rate and regular rhythm.   Pulmonary/Chest: Effort normal. She has wheezes. She has rales.  Neurological: She is alert and oriented to person, place, and time.        Assessment & Plan:     1. Fever, unspecified fever cause Will treat with antibiotic.   2. COPD exacerbation Will Treat with antibiotic and prednisone. Call if worsens or does not improve.  - predniSONE (DELTASONE) 10 MG tablet; 6 po for 2 days and then 5 po for 2 days and then 4 po for 2 days and 3 po for 2 days and then 2 po for 2 days and then 1 po for 2 days.  Dispense: 42 tablet; Refill: 0 - doxycycline (VIBRA-TABS) 100 MG tablet; Take 1 tablet (100 mg total) by mouth 2 (two) times daily.  Dispense: 20 tablet; Refill: 0       [name], MD  [facility]

## 2015-06-26 ENCOUNTER — Other Ambulatory Visit: Payer: Self-pay | Admitting: Family Medicine

## 2015-06-26 DIAGNOSIS — J309 Allergic rhinitis, unspecified: Secondary | ICD-10-CM

## 2015-06-26 NOTE — Telephone Encounter (Signed)
Last ov 04/11/15

## 2015-06-27 ENCOUNTER — Telehealth: Payer: Self-pay | Admitting: Family Medicine

## 2015-06-27 DIAGNOSIS — J441 Chronic obstructive pulmonary disease with (acute) exacerbation: Secondary | ICD-10-CM

## 2015-06-27 MED ORDER — DOXYCYCLINE HYCLATE 100 MG PO TABS: 100.0000 mg | ORAL_TABLET | Freq: Two times a day (BID) | ORAL | Status: DC

## 2015-06-27 NOTE — Telephone Encounter (Signed)
Patient states that she need another round of doxycycline (VIBRA-TABS) 100 MG tablet  [facility]

## 2015-06-27 NOTE — Telephone Encounter (Signed)
Patient was seen in the office on 06/17/15 and was diagnosed with COPD exacerbation. She was prescribed prednisone 12 day taper and Doxy. Patient is better but not 100%. Patient uses [facility]. Thanks!

## 2015-06-27 NOTE — Telephone Encounter (Signed)
Patient feels that she needs another round of abx due to having a lot of congestion. Patient has a few more days left on the prednisone.

## 2015-06-27 NOTE — Telephone Encounter (Signed)
What does she think she needs. Thanks.

## 2015-07-05 ENCOUNTER — Encounter: Payer: Self-pay | Admitting: Pulmonary Disease

## 2015-07-05 ENCOUNTER — Ambulatory Visit (INDEPENDENT_AMBULATORY_CARE_PROVIDER_SITE_OTHER): Payer: Medicare PPO | Admitting: Pulmonary Disease

## 2015-07-05 VITALS — BP 138/88 | HR 129 | Ht 63.0 in | Wt 149.0 lb

## 2015-07-05 DIAGNOSIS — R0600 Dyspnea, unspecified: Secondary | ICD-10-CM | POA: Diagnosis not present

## 2015-07-05 DIAGNOSIS — J440 Chronic obstructive pulmonary disease with acute lower respiratory infection: Secondary | ICD-10-CM | POA: Diagnosis not present

## 2015-07-05 DIAGNOSIS — I471 Supraventricular tachycardia: Secondary | ICD-10-CM | POA: Diagnosis not present

## 2015-07-05 DIAGNOSIS — R251 Tremor, unspecified: Secondary | ICD-10-CM | POA: Diagnosis not present

## 2015-07-05 DIAGNOSIS — R000 Tachycardia, unspecified: Secondary | ICD-10-CM

## 2015-07-05 MED ORDER — ARFORMOTEROL TARTRATE 15 MCG/2ML IN NEBU: 15.0000 ug | INHALATION_SOLUTION | Freq: Two times a day (BID) | RESPIRATORY_TRACT | Status: DC

## 2015-07-05 MED ORDER — BUDESONIDE 0.5 MG/2ML IN SUSP: 0.5000 mg | Freq: Two times a day (BID) | RESPIRATORY_TRACT | Status: DC

## 2015-07-05 NOTE — Patient Instructions (Addendum)
Stop Symbicort New medication: budesonide - 0.5 mg in nebulizer machine twice a day New medication: arformoterol - 15 mcg in nebulizer machine twice a day Continue Spiriva inhaler Complete Doxycycline course as prescribed Continue albuterol as needed  Follow up with me in 3-4 weeks Call sooner if needed  Billy Fischer, MD

## 2015-07-05 NOTE — Progress Notes (Signed)
PROBLEMS: COPD  INTERVAL HISTORY: Hospitalized 03/08/15 for AECOPD Frequent exacerbations (almost monthly) in respiratory symptoms treated with prednisone by primary MD, Dr Elease Hashimoto  SUBJ: C/O increased dyspnea, fatigue, chest congestion, cough, mucopurulent sputum, no fever, no hemoptysis, no CP, no LE edema or calf tenderness. When she gets treated with prednisone, her symptoms improve. She has jsut finished a course and is completing a course of doxycycline. Used albuterol neb twice this AM  OBJ: Filed Vitals:   07/05/15 1153 07/05/15 1230  BP: 162/90 138/88  Pulse: 129   Height: 5\' 3"  (1.6 m)   Weight: 149 lb (67.586 kg)   SpO2: 96%     Anxious appearing, mildly tremulous Slightly cushingoid facies HEENT otherwise nl No JVD BS full without wheezes Tachycardic, hyperdynamic precordium, no M noted abd soft, mildly obese, NT, +BS Ext with multiple bruises, no C/C/E  DATA:   IMPRESSION: COPD - at baseline, she has little or no limitation Freq COPD exacerbations - there might be an anxiety component overlying or underlying her freq symptoms. 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 Consider change of ACEI to ARB as some patinets with obstructive lung diseases tolerate ACEIs poorly Call sooner if needed   Merwyn Katos, MD [facility]

## 2015-07-08 ENCOUNTER — Telehealth: Payer: Self-pay | Admitting: Pulmonary Disease

## 2015-07-08 NOTE — Telephone Encounter (Signed)
Pt is feeling worse and is wanting this to hurry to get the meds   [phone]

## 2015-07-08 NOTE — Telephone Encounter (Signed)
Spoke with pt. Advised her that we are still waiting to hear back from Dr. Sung Amabile about this medication. Dr. Sung Amabile - please advise. Thanks!

## 2015-07-08 NOTE — Telephone Encounter (Signed)
Patient says she saw Dr. Sung Amabile on Friday, 8/19;  Patient says that he prescribed her some neb medications and her insurance company is requiring Prior Authorization for one of the medications, she is unsure which medication they are requesting PA for.  Called [facility] to confirm:  They confirmed that Brovana needs PA.  Patients insurance will cover the Budesonide and Albuterol.  Patient did not pick up either medication from pharmacy until the Rosalyn Gess has been approved.  Dr. Sung Amabile, would you like for [place] to initiate PA for Brovana?  Please advise.

## 2015-07-09 NOTE — Telephone Encounter (Addendum)
Patient states that she has not heard anything about her Brovana.  She said that she is not taking any medication right now and her breathing is getting worse. Wants to know what Dr. Sung Amabile wants to do about her medication  Insurance will cover Budesonide and Albuterol, but will not cover Brovana without prior authorization.  Dr. Sung Amabile is off today - Dr. Sherene Sires, please advise.

## 2015-07-09 NOTE — Telephone Encounter (Signed)
Patient notified of Dr. Thurston Hole recommendations. To Dr. Sung Amabile to respond to below regarding Brovana  Please advise.

## 2015-07-09 NOTE — Telephone Encounter (Signed)
In short run can use budesonide 0.25 mg twice daily and albuterol qid until regroups with Dr Alva Garnet

## 2015-07-09 NOTE — Telephone Encounter (Signed)
Patient called back about meds. She says she is having breathing difficulty.  She is upset that it's been since Friday and she has not heard anything.

## 2015-07-10 ENCOUNTER — Other Ambulatory Visit: Payer: Self-pay | Admitting: Family Medicine

## 2015-07-10 DIAGNOSIS — L309 Dermatitis, unspecified: Secondary | ICD-10-CM | POA: Insufficient documentation

## 2015-07-10 NOTE — Telephone Encounter (Signed)
Sent message to Dr. Sung Amabile to respond Awaiting response

## 2015-07-11 NOTE — Telephone Encounter (Signed)
Dr. Sung Amabile please advise.  thanks

## 2015-07-15 NOTE — Telephone Encounter (Signed)
Lori Cowden, MD at 07/09/2015 4:14 PM     Status: Signed       Expand All Collapse All   In short run can use budesonide 0.25 mg twice daily and albuterol qid until regroups with Dr Iran Sizer, CMA at 07/09/2015 3:48 PM     Status: Addendum       Expand All Collapse All   Patient states that she has not heard anything about her Brovana.  She said that she is not taking any medication right now and her breathing is getting worse. Wants to know what Lori Brady wants to do about her medication  Insurance will cover Budesonide and Albuterol, but will not cover Brovana without prior authorization.  Lori Brady is off today - Lori Brady, please advise.       Lori Brady - please advise regarding patient's medications.

## 2015-07-16 ENCOUNTER — Encounter: Payer: Self-pay | Admitting: Family Medicine

## 2015-07-16 ENCOUNTER — Ambulatory Visit (INDEPENDENT_AMBULATORY_CARE_PROVIDER_SITE_OTHER): Payer: Medicare PPO | Admitting: Family Medicine

## 2015-07-16 VITALS — BP 122/74 | HR 108 | Temp 98.8°F | Resp 20 | Wt 148.0 lb

## 2015-07-16 DIAGNOSIS — F419 Anxiety disorder, unspecified: Secondary | ICD-10-CM

## 2015-07-16 DIAGNOSIS — J441 Chronic obstructive pulmonary disease with (acute) exacerbation: Secondary | ICD-10-CM | POA: Diagnosis not present

## 2015-07-16 DIAGNOSIS — J449 Chronic obstructive pulmonary disease, unspecified: Secondary | ICD-10-CM

## 2015-07-16 DIAGNOSIS — R21 Rash and other nonspecific skin eruption: Secondary | ICD-10-CM | POA: Diagnosis not present

## 2015-07-16 MED ORDER — AMOXICILLIN-POT CLAVULANATE 875-125 MG PO TABS: 1.0000 | ORAL_TABLET | Freq: Two times a day (BID) | ORAL | Status: DC

## 2015-07-16 MED ORDER — CITALOPRAM HYDROBROMIDE 20 MG PO TABS
20.0000 mg | ORAL_TABLET | Freq: Every day | ORAL | Status: DC
Start: 2015-07-16 — End: 2015-10-15

## 2015-07-16 NOTE — Progress Notes (Signed)
Patient ID: [name], female   DOB: [birth year], 69 y.o.   MRN: [id number]         Patient: Lori Brady Female    DOB: 04/19/1946   69 y.o.   MRN: [id number] Visit Date: 07/16/2015  Today's Provider: [name], MD   Chief Complaint  Patient presents with  . COPD  . Rash   Subjective:    Rash This is a new problem. The current episode started 1 to 4 weeks ago. The problem is unchanged. The affected locations include the back, torso, chest, abdomen, left upper leg and right upper leg. Associated symptoms include anorexia, coughing, diarrhea, fatigue, a fever (On and off fever between 99.1 - 100.4.) and shortness of breath. Pertinent negatives include no eye pain or vomiting. Past treatments include anti-itch cream, antihistamine and antibiotic cream. The treatment provided no relief. Her past medical history is significant for eczema.  Anxiety Presents for follow-up visit. The problem has been rapidly worsening. Symptoms include depressed mood, dizziness, excessive worry, nervous/anxious behavior (Pt reports being under a lot of stress right now. ) and shortness of breath. Patient reports no chest pain, confusion, decreased concentration, insomnia, nausea, palpitations or suicidal ideas. Symptoms occur constantly. The quality of sleep is good. Nighttime awakenings: one to two.   Past treatments include benzodiazephines. The treatment provided no relief (Pt reports Xanax is no longer working.  ). Compliance with prior treatments has been good.  Fever  The current episode started in the past 7 days. The problem occurs intermittently. The problem has been unchanged. The maximum temperature noted was 100 to 100.9 F. Associated symptoms include coughing, diarrhea, headaches, a rash and wheezing. Pertinent negatives include no abdominal pain, chest pain, nausea or vomiting.   Saw pulmonary and had problems getting new medication. Needed a prior authorization.      Allergies  Allergen  Reactions  . Levaquin [Levofloxacin] Nausea Only  . Vicodin [Hydrocodone-Acetaminophen]     nausea   Previous Medications   ALBUTEROL (ACCUNEB) 0.63 MG/3ML NEBULIZER SOLUTION    Take 1 ampule by nebulization every 6 (six) hours as needed for wheezing.   ALBUTEROL (PROVENTIL HFA;VENTOLIN HFA) 108 (90 BASE) MCG/ACT INHALER    Inhale 2 puffs into the lungs every 6 (six) hours as needed for wheezing or shortness of breath.   ALENDRONATE (FOSAMAX) 70 MG TABLET    Take by mouth.   ALPRAZOLAM (XANAX) 0.5 MG TABLET    Take 1 tablet (0.5 mg total) by mouth 2 (two) times daily as needed for anxiety.   ASPIRIN 81 MG TABLET    Take 81 mg by mouth daily.   BETAMETHASONE VALERATE (VALISONE) 0.1 % CREAM    APPLY TOPICALLY TO AFFECTED AREA SPARINGLY 2 TIMES DAILY.   BUDESONIDE (PULMICORT) 0.5 MG/2ML NEBULIZER SOLUTION    Take 2 mLs (0.5 mg total) by nebulization 2 (two) times daily.   CHLORTHALIDONE (HYGROTON) 25 MG TABLET    Take 25 mg by mouth daily.   CHOLECALCIFEROL (VITAMIN D) 2000 UNITS CAPS    Take 1 capsule by mouth daily.   FLUTICASONE (FLONASE) 50 MCG/ACT NASAL SPRAY    USE 2 SPRAYS IN EACH NOSTRIL ONCE DAILY.   LISINOPRIL (PRINIVIL,ZESTRIL) 10 MG TABLET    Take 10 mg by mouth daily.   MONTELUKAST (SINGULAIR) 10 MG TABLET    Take 10 mg by mouth at bedtime.   OMEGA-3 FATTY ACIDS (FISH OIL) 1000 MG CAPS    Take 1 capsule by mouth daily.  OMEPRAZOLE (PRILOSEC) 10 MG CAPSULE    Take by mouth.   POTASSIUM CHLORIDE ER 20 MEQ TBCR    Take by mouth.   SIMVASTATIN (ZOCOR) 40 MG TABLET    Take 40 mg by mouth daily.   SPACER/AERO-HOLDING CHAMBERS (AEROCHAMBER MV) INHALER    Use as instructed with inhaler.  DX 496.   TIOTROPIUM (SPIRIVA) 18 MCG INHALATION CAPSULE    Place 1 capsule (18 mcg total) into inhaler and inhale daily.   ZOLPIDEM (AMBIEN) 5 MG TABLET    Take by mouth.    Review of Systems  Constitutional: Positive for fever (On and off fever between 99.1 - 100.4.), chills, appetite change (Pt  reports not having an appetite. ) and fatigue. Negative for diaphoresis.  Eyes: Negative for pain.  Respiratory: Positive for cough, chest tightness, shortness of breath and wheezing. Negative for apnea, choking and stridor.   Cardiovascular: Negative for chest pain, palpitations and leg swelling.  Gastrointestinal: Positive for diarrhea and anorexia. Negative for nausea, vomiting, abdominal pain, constipation, blood in stool, abdominal distention, anal bleeding and rectal pain.  Skin: Positive for rash.  Neurological: Positive for dizziness, light-headedness and headaches.  Psychiatric/Behavioral: Positive for dysphoric mood. Negative for suicidal ideas, hallucinations, behavioral problems, confusion, sleep disturbance, self-injury, decreased concentration and agitation. The patient is nervous/anxious (Pt reports being under a lot of stress right now. ). The patient does not have insomnia and is not hyperactive.     Social History  Substance Use Topics  . Smoking status: Former Smoker -- 1.00 packs/day for 30 years    Types: Cigarettes    Quit date: 08/01/2008  . Smokeless tobacco: Never Used  . Alcohol Use: Yes     Comment: occasional   Objective:   BP 122/74 mmHg  Pulse 108  Temp(Src) 98.8 F (37.1 C) (Oral)  Resp 20  Wt 148 lb (67.132 kg)  SpO2 97%  Physical Exam  Constitutional: She is oriented to person, place, and time. She appears well-developed and well-nourished.  HENT:  Head: Normocephalic and atraumatic.  Right Ear: External ear normal.  Left Ear: External ear normal.  Mouth/Throat: Oropharynx is clear and moist.  Eyes: Conjunctivae and EOM are normal. Pupils are equal, round, and reactive to light.  Neck: Normal range of motion. Neck supple.  Cardiovascular: Normal rate and regular rhythm.   Pulmonary/Chest: Effort normal and breath sounds normal.  Neurological: She is alert and oriented to person, place, and time.  Psychiatric: She has a normal mood and affect.  Her behavior is normal. Judgment and thought content normal.        Assessment & Plan:     1. Rash Erythema. Suspect related to stress.  Will check labs, start Celexa and recheck at follow up. Patient instructed to call back if condition worsens or does not improve.   Will also check labs, as had reported fevers.  - Comprehensive metabolic panel - CBC  2. COPD, severe Will check CXR. - DG Chest 2 View; Future  3. COPD exacerbation Has been on a lot of prednisone without a lot of relief.  - Comprehensive metabolic panel - CBC - amoxicillin-clavulanate (AUGMENTIN) 875-125 MG per tablet; Take 1 tablet by mouth 2 (two) times daily.  Dispense: 20 tablet; Refill: 0  4. Anxiety Condition is worsening. Will start medication for better control.   - citalopram (CELEXA) 20 MG tablet; Take 1 tablet (20 mg total) by mouth daily. 1/2 a day for 1 week and then one a daily  Dispense: 30 tablet; Refill: 3     [name], MD  [facility]

## 2015-07-18 ENCOUNTER — Ambulatory Visit
Admission: RE | Admit: 2015-07-18 | Discharge: 2015-07-18 | Disposition: A | Discharge status: Home / Self Care | Payer: Medicare PPO | Source: Ambulatory Visit | Attending: Family Medicine | Admitting: Family Medicine

## 2015-07-18 DIAGNOSIS — R509 Fever, unspecified: Secondary | ICD-10-CM | POA: Diagnosis not present

## 2015-07-18 DIAGNOSIS — J449 Chronic obstructive pulmonary disease, unspecified: Secondary | ICD-10-CM

## 2015-07-18 DIAGNOSIS — R0602 Shortness of breath: Secondary | ICD-10-CM | POA: Insufficient documentation

## 2015-07-18 DIAGNOSIS — R21 Rash and other nonspecific skin eruption: Secondary | ICD-10-CM | POA: Diagnosis not present

## 2015-07-18 DIAGNOSIS — R05 Cough: Secondary | ICD-10-CM | POA: Insufficient documentation

## 2015-07-18 DIAGNOSIS — J441 Chronic obstructive pulmonary disease with (acute) exacerbation: Secondary | ICD-10-CM | POA: Diagnosis not present

## 2015-07-18 NOTE — Telephone Encounter (Signed)
Dr. Alva Garnet please advise on alternative med for pt.  Thanks!

## 2015-07-19 ENCOUNTER — Telehealth: Payer: Self-pay

## 2015-07-19 LAB — CBC
Hematocrit: 34.4 % (ref 34.0–46.6)
Hemoglobin: 11.3 g/dL (ref 11.1–15.9)
MCH: 31 pg (ref 26.6–33.0)
MCHC: 32.8 g/dL (ref 31.5–35.7)
MCV: 95 fL (ref 79–97)
Platelets: 240 10*3/uL (ref 150–379)
RBC: 3.64 x10E6/uL — ABNORMAL LOW (ref 3.77–5.28)
RDW: 13.3 % (ref 12.3–15.4)
WBC: 12.7 10*3/uL — AB (ref 3.4–10.8)

## 2015-07-19 LAB — COMPREHENSIVE METABOLIC PANEL
ALK PHOS: 61 IU/L (ref 39–117)
ALT: 34 IU/L — ABNORMAL HIGH (ref 0–32)
AST: 27 IU/L (ref 0–40)
Albumin/Globulin Ratio: 1.7 (ref 1.1–2.5)
Albumin: 4.1 g/dL (ref 3.6–4.8)
BILIRUBIN TOTAL: 0.6 mg/dL (ref 0.0–1.2)
BUN/Creatinine Ratio: 9 — ABNORMAL LOW (ref 11–26)
BUN: 9 mg/dL (ref 8–27)
CHLORIDE: 98 mmol/L (ref 97–108)
CO2: 25 mmol/L (ref 18–29)
Calcium: 9.3 mg/dL (ref 8.7–10.3)
Creatinine, Ser: 0.96 mg/dL (ref 0.57–1.00)
GFR calc Af Amer: 71 mL/min/{1.73_m2} (ref >59)
GFR calc non Af Amer: 61 mL/min/{1.73_m2} (ref >59)
GLUCOSE: 116 mg/dL — AB (ref 65–99)
Globulin, Total: 2.4 g/dL (ref 1.5–4.5)
Potassium: 4.2 mmol/L (ref 3.5–5.2)
Sodium: 139 mmol/L (ref 134–144)
Total Protein: 6.5 g/dL (ref 6.0–8.5)

## 2015-07-19 NOTE — Telephone Encounter (Signed)
Dr. Sung Amabile, Please advise on alternative med for patient.

## 2015-07-19 NOTE — Telephone Encounter (Signed)
Spoke with Dr. Alva Garnet on the phone, states that he would like to initiate PA for [facility].   Called [facility], PA form is being faxed to our office.  Will await fax.

## 2015-07-19 NOTE — Telephone Encounter (Signed)
-----   Message from Margarita Rana, MD sent at 07/18/2015 12:51 PM EDT ----- No pneumonia or bronchitis. Thanks.

## 2015-07-19 NOTE — Telephone Encounter (Signed)
Pt advised.   Thanks,   -Laura  

## 2015-07-19 NOTE — Telephone Encounter (Signed)
Advised patient as below. Patient reports that she is still running a low grade temp (up to 100 degrees) for the past 2 days. Patient reports that she is still not feeling well. Patient is currently taking Augmentin and denies any side effects. Patient still has cough, shortness of breath, and fatigue. Is there anything that you want patient to do differently? Patient just started abx 3 days ago. Please advise. Thanks!

## 2015-07-19 NOTE — Telephone Encounter (Signed)
Continue medication. Hopefully will continue to improve on antibiotics and increase in Celexa.  Thanks.

## 2015-07-24 ENCOUNTER — Encounter: Payer: Self-pay | Admitting: Pulmonary Disease

## 2015-07-24 ENCOUNTER — Ambulatory Visit (INDEPENDENT_AMBULATORY_CARE_PROVIDER_SITE_OTHER): Payer: Medicare PPO | Admitting: Pulmonary Disease

## 2015-07-24 VITALS — BP 146/82 | HR 112 | Ht 63.0 in | Wt 146.0 lb

## 2015-07-24 DIAGNOSIS — R0600 Dyspnea, unspecified: Secondary | ICD-10-CM | POA: Diagnosis not present

## 2015-07-24 DIAGNOSIS — J449 Chronic obstructive pulmonary disease, unspecified: Secondary | ICD-10-CM

## 2015-07-24 DIAGNOSIS — F329 Major depressive disorder, single episode, unspecified: Secondary | ICD-10-CM

## 2015-07-24 DIAGNOSIS — J440 Chronic obstructive pulmonary disease with acute lower respiratory infection: Secondary | ICD-10-CM | POA: Diagnosis not present

## 2015-07-24 DIAGNOSIS — F32A Depression, unspecified: Secondary | ICD-10-CM

## 2015-07-24 MED ORDER — BUDESONIDE 0.5 MG/2ML IN SUSP: 0.5000 mg | Freq: Two times a day (BID) | RESPIRATORY_TRACT | Status: DC

## 2015-07-24 NOTE — Progress Notes (Signed)
Complains of weakness, hoarseness, nausea, general malaise, continued DOE. Remains largely housebound which she attributes to her respiratory difficulties. She was recently started on Augmentin for fever and Celexa for depression by Dr Elease Hashimoto. She indicates that she is experiencing lots of stressors in her daily life.    Filed Vitals:   07/24/15 0858  BP: 146/82  Pulse: 112  Height: 5\' 3"  (1.6 m)  Weight: 66.225 kg (146 lb)  SpO2: 95%   Flat affect, NAD HEENT WNL No JVD Full BS, minimal scattered wheezes - partially clear with cough RRR s M NABS no C/C/E  IMPRESSION: COPD Limiting dyspnea - symptoms seem way out of proportion to objective findings Suspect stress/depression is contributing to or amplifying her symptoms   PLAN: We will continue to try to procure nebulized arformoterol She is to continue Pulmicort BID Cont Spiriva daily Cont albuterol as needed up to 4 times a day  Try to minimize use of this if possible Continue to work on stress/depression with Dr Elease Hashimoto Encouraged increase in activity (walking) and increased time out of doors now that weather is cooling off ROV 4 weeks with PFTs prior to that visit to try to gauge objectively the severity of her COPD  Billy Fischer, MD PCCM service Mobile [phone] Pager [phone]

## 2015-07-24 NOTE — Patient Instructions (Signed)
We will continue to try to get the Brovana Continue Pulmicort as prescribed Cont Spiriva daily Cont albuterol as needed up to 4 times a day  Try to minimize use of this if possible Continue to work on stress/depression with Dr Harriette Bouillon  I think this is a major contributor to or amplifier of your symptoms Increase activity (walking) and increase time out of doors now that weather is cooling off Come back and see me in 4 weeks

## 2015-07-24 NOTE — Addendum Note (Signed)
Addended by: Oscar La R on: 07/24/2015 09:56 AM   Modules accepted: Orders

## 2015-07-24 NOTE — Telephone Encounter (Signed)
Sent rx for Brovana to [facility] pharmacy to see if we can get Brovana filled. OK per Dr. Alva Garnet. Will await response.

## 2015-07-24 NOTE — Addendum Note (Signed)
Addended by: Meyer Cory R on: 07/24/2015 10:01 AM   Modules accepted: Orders

## 2015-07-25 ENCOUNTER — Telehealth: Payer: Self-pay | Admitting: Pulmonary Disease

## 2015-07-25 NOTE — Telephone Encounter (Signed)
Patient calling because the pharmacy delivered her another box of Budesonide instead of Brovana.  She says that she needs the [place] and needs to know when she can get the [place].  Patient advised that [facility] sent Brovana RX to [facility] yesterday.    Message sent to [facility] to check on progress of medication. Awaiting message back from [place]. Hold in triage

## 2015-07-25 NOTE — Telephone Encounter (Signed)
Per Melissa: Rx was sent to [facility] phone number: [phone] Spoke with Selena Batten at [facility] pharmacy, advised her that patient received Budesonide and should have received Brovana. Pharmacist at [facility] states that she received rx for Budesonide, did not receive RX for Brovana. Do not see where Rosalyn Gess was sent.  It is not on med list. Asked pharmacist to run through [place] on patient's insurance and she told me Rosalyn Gess would cost patient $111.78 per month Patient notified and says that she cannot afford this every month, she is living off social security. Patient wants to know if there is another medication she can take instead of Brovana that is similar but cheaper?  Dr. Sung Amabile, please advise.

## 2015-07-26 NOTE — Telephone Encounter (Signed)
lmtcb x1 for pt. 

## 2015-07-26 NOTE — Telephone Encounter (Signed)
Pt is calling back to see if we can go ahead and order brovana for one month. [phone]

## 2015-07-26 NOTE — Telephone Encounter (Signed)
Pt returned call  [phone]  Pt states we did not call her at all today and wants a call today.

## 2015-07-26 NOTE — Telephone Encounter (Signed)
Called and spoke with pt Pt stated that she would like a 30 day supply of Brovana to [facility] pharm  Do not see active order for brovana on pt's med list  Dr Sung Amabile, please advise of how you would like Brovana called into pt's pharm  Thank you

## 2015-07-29 ENCOUNTER — Telehealth: Payer: Self-pay | Admitting: Family Medicine

## 2015-07-29 ENCOUNTER — Telehealth: Payer: Self-pay | Admitting: Pulmonary Disease

## 2015-07-29 MED ORDER — PREDNISONE 10 MG PO TABS: ORAL_TABLET | ORAL | Status: DC

## 2015-07-29 NOTE — Telephone Encounter (Signed)
Not improved. Will send in another round of prednisone. Purnell Shoemaker will take her to ER if not improved.

## 2015-07-29 NOTE — Telephone Encounter (Signed)
[name] called saying her sister Simrit was very sick "will not get out of bed and hasn't eat anything in a couple of days"   [name] asked that Dr. Venia Minks please call her.  Call back is [phone]  Thanks Con Memos

## 2015-07-29 NOTE — Telephone Encounter (Signed)
The alternative is albuterol 2.5 mg nebulized 4 times a day. She already has this medication and it is listed as PRN. I recommend that she use this on a schedule 4 times a day  Change albuterol from q 6 hrs PRN to albuterol QID and q 4 hrs PRN  Billy Fischer, MD Mobile [phone] Pager [phone]

## 2015-07-29 NOTE — Telephone Encounter (Signed)
Patient notified of Dr. Sung Amabile recommendations per 9/8 TE Nothing further needed.

## 2015-07-29 NOTE — Telephone Encounter (Signed)
Msg was closed -------------------------------  lmtcb.

## 2015-07-29 NOTE — Telephone Encounter (Signed)
Patient notified.  Nothing further needed. 

## 2015-07-29 NOTE — Telephone Encounter (Signed)
Patient says she cannot afford the Brovana.  She would like another RX sent in that is cheaper.  Patient is struggling with her breathing right now and needs medication ASAP. She said that she called her PCP and they are sending her in some Prednisone to help.  She is unable to eat and cannot get out of bed.  Dr. Elease Hashimoto advised patient to go to ER if not better on Prednisone.   Dr. Sung Amabile - please advise.

## 2015-08-05 ENCOUNTER — Other Ambulatory Visit: Payer: Self-pay

## 2015-08-05 MED ORDER — TIOTROPIUM BROMIDE MONOHYDRATE 18 MCG IN CAPS: 18.0000 ug | ORAL_CAPSULE | Freq: Every day | RESPIRATORY_TRACT | Status: DC

## 2015-08-13 DIAGNOSIS — R600 Localized edema: Secondary | ICD-10-CM | POA: Diagnosis not present

## 2015-08-13 DIAGNOSIS — I10 Essential (primary) hypertension: Secondary | ICD-10-CM | POA: Diagnosis not present

## 2015-08-13 DIAGNOSIS — I2510 Atherosclerotic heart disease of native coronary artery without angina pectoris: Secondary | ICD-10-CM | POA: Diagnosis not present

## 2015-08-13 DIAGNOSIS — I471 Supraventricular tachycardia: Secondary | ICD-10-CM | POA: Diagnosis not present

## 2015-08-16 ENCOUNTER — Other Ambulatory Visit: Payer: Self-pay | Admitting: Family Medicine

## 2015-08-16 MED ORDER — PREDNISONE 10 MG PO TABS
ORAL_TABLET | ORAL | Status: DC
Start: 2015-08-16 — End: 2016-01-14

## 2015-08-16 NOTE — Telephone Encounter (Signed)
OV if not improved. Let her know that we are open tomorrow am if needed. Thanks.

## 2015-08-16 NOTE — Telephone Encounter (Signed)
Informed pt as below. Renaldo Fiddler, CMA

## 2015-08-16 NOTE — Telephone Encounter (Signed)
Pt is having a cough and would like for you to call her in prednisone.  She uses [facility].  Her call back is [phone]  Thanks Con Memos

## 2015-08-20 ENCOUNTER — Other Ambulatory Visit: Payer: Self-pay | Admitting: *Deleted

## 2015-08-20 MED ORDER — ALBUTEROL SULFATE HFA 108 (90 BASE) MCG/ACT IN AERS: 2.0000 | INHALATION_SPRAY | Freq: Four times a day (QID) | RESPIRATORY_TRACT | Status: DC | PRN

## 2015-08-21 ENCOUNTER — Encounter: Payer: Self-pay | Admitting: Family Medicine

## 2015-08-21 ENCOUNTER — Ambulatory Visit: Payer: Medicare PPO | Admitting: Pulmonary Disease

## 2015-08-21 ENCOUNTER — Ambulatory Visit (INDEPENDENT_AMBULATORY_CARE_PROVIDER_SITE_OTHER): Payer: Medicare PPO | Admitting: Family Medicine

## 2015-08-21 VITALS — BP 142/90 | HR 96 | Temp 97.9°F | Resp 22 | Wt 149.0 lb

## 2015-08-21 DIAGNOSIS — Z8673 Personal history of transient ischemic attack (TIA), and cerebral infarction without residual deficits: Secondary | ICD-10-CM | POA: Insufficient documentation

## 2015-08-21 DIAGNOSIS — D692 Other nonthrombocytopenic purpura: Secondary | ICD-10-CM | POA: Diagnosis not present

## 2015-08-21 DIAGNOSIS — I639 Cerebral infarction, unspecified: Secondary | ICD-10-CM

## 2015-08-21 DIAGNOSIS — I10 Essential (primary) hypertension: Secondary | ICD-10-CM | POA: Diagnosis not present

## 2015-08-21 DIAGNOSIS — J449 Chronic obstructive pulmonary disease, unspecified: Secondary | ICD-10-CM | POA: Diagnosis not present

## 2015-08-21 NOTE — Progress Notes (Signed)
Subjective:    Patient ID: Lori Brady, female    DOB: 08/09/1946, 69 y.o.   MRN: [id number]  HPI  COPD: She presents for evaluation and treatment of COPD. Symptoms include drainage from nose, dry cough, frequent throat clearing, hoarseness  , morning cough, productive cough and wheezing. Symptoms began 2 years ago, controlled only while taking Prednisone since that time. Really unhappy with pulmonologist.  Told her she was depressed.   She denies chest pain, located left chest or right chest, difficulty breathing, edema  , hemoptysis   and tightness in chest.  Associated symptoms include headache  , nasal congestion, nonproductive cough, productive cough, rhinorrhea  , sneezing, sweats and wheezing. Weight has been stable.  Appetite has been increased due to Prednisone. Symptoms are exacerbated by minimal activity.  Symptoms are alleviated by medication(s) (Prednisone).She has treated this current exacerbation with Prednisone. The patient reports adherence to this regimen. She has had excessive hunger due to medications.  Pt requesting referral to a different pulmonologist; pt is very frustrated with current pulmonologist Dr. Melvenia Beam at [facility]. Pt feels he is not compassionate. Celexa has helped her, just did not appreciate the way he treated her.     Review of Systems  Constitutional: Positive for diaphoresis and appetite change. Negative for fever, chills, activity change, fatigue and unexpected weight change.  HENT: Positive for voice change.   Respiratory: Positive for cough and wheezing. Negative for chest tightness and shortness of breath.   Cardiovascular: Negative for chest pain, palpitations and leg swelling.   BP 142/90 mmHg  Pulse 96  Temp(Src) 97.9 F (36.6 C) (Oral)  Resp 22  Wt 149 lb (67.586 kg)   Patient Active Problem List   Diagnosis Date Noted  . Cerebrovascular accident (CVA) (HCC) 08/21/2015  . Atrial tachycardia (HCC) 08/13/2015  . Eczema 07/10/2015  . Allergic  rhinitis 06/26/2015  . BP (high blood pressure) 05/03/2015  . Decreased potassium in the blood 05/03/2015  . Anxiety 04/23/2015  . Edema leg 03/27/2015  . Fatigue 03/06/2015  . Primary cardiomyopathy (HCC) 03/05/2015  . Benign essential HTN 02/28/2015  . COPD exacerbation (HCC) 10/01/2014  . Combined fat and carbohydrate induced hyperlipemia 09/06/2014  . COPD, severe (HCC) 08/01/2014  . Acid reflux 08/01/2014  . Cough 08/01/2014   Past Medical History  Diagnosis Date  . Asthma   . Arthritis   . History of chicken pox   . Emphysema of lung (HCC)   . GERD (gastroesophageal reflux disease)   . Seasonal allergies   . Hypertension   . Hypercholesteremia   . Hx of completed stroke   . History of heart attack   . UTI (lower urinary tract infection)    Current Outpatient Prescriptions on File Prior to Visit  Medication Sig  . albuterol (ACCUNEB) 0.63 MG/3ML nebulizer solution Take 1 ampule by nebulization every 6 (six) hours as needed for wheezing.  Marland Kitchen albuterol (PROVENTIL HFA;VENTOLIN HFA) 108 (90 BASE) MCG/ACT inhaler Inhale 2 puffs into the lungs every 6 (six) hours as needed for wheezing or shortness of breath.  Marland Kitchen alendronate (FOSAMAX) 70 MG tablet Take by mouth.  . ALPRAZolam (XANAX) 0.5 MG tablet Take 1 tablet (0.5 mg total) by mouth 2 (two) times daily as needed for anxiety.  Marland Kitchen aspirin 81 MG tablet Take 81 mg by mouth daily.  . betamethasone valerate (VALISONE) 0.1 % cream APPLY TOPICALLY TO AFFECTED AREA SPARINGLY 2 TIMES DAILY.  . chlorthalidone (HYGROTON) 25 MG tablet Take 25 mg by  mouth daily.  . Cholecalciferol (VITAMIN D) 2000 UNITS CAPS Take 1 capsule by mouth daily.  . citalopram (CELEXA) 20 MG tablet Take 1 tablet (20 mg total) by mouth daily. 1/2 a day for 1 week and then one a daily  . fluticasone (FLONASE) 50 MCG/ACT nasal spray USE 2 SPRAYS IN EACH NOSTRIL ONCE DAILY.  Marland Kitchen lisinopril (PRINIVIL,ZESTRIL) 10 MG tablet Take 10 mg by mouth daily.  . montelukast  (SINGULAIR) 10 MG tablet Take 10 mg by mouth at bedtime.  . Omega-3 Fatty Acids (FISH OIL) 1000 MG CAPS Take 1 capsule by mouth daily.  Marland Kitchen omeprazole (PRILOSEC) 10 MG capsule Take by mouth.  . Potassium Chloride ER 20 MEQ TBCR Take by mouth.  . predniSONE (DELTASONE) 10 MG tablet 6 po for 2 days and then 5 po for 2 days and then 4 po for 2 days and 3 po for 2 days and then 2 po for 2 days and then 1 po for 2 days.  . simvastatin (ZOCOR) 40 MG tablet Take 40 mg by mouth daily.  Marland Kitchen Spacer/Aero-Holding Chambers (AEROCHAMBER MV) inhaler Use as instructed with inhaler.  DX 496.  . tiotropium (SPIRIVA) 18 MCG inhalation capsule Place 1 capsule (18 mcg total) into inhaler and inhale daily.  Marland Kitchen zolpidem (AMBIEN) 5 MG tablet Take by mouth.   No current facility-administered medications on file prior to visit.   Allergies  Allergen Reactions  . Levaquin [Levofloxacin] Nausea Only  . Vicodin [Hydrocodone-Acetaminophen]     nausea   Past Surgical History  Procedure Laterality Date  . Breast biopsy     Social History   Social History  . Marital Status: Widowed    Spouse Name: N/A  . Number of Children: N/A  . Years of Education: N/A   Occupational History  . Not on file.   Social History Main Topics  . Smoking status: Former Smoker -- 1.00 packs/day for 30 years    Types: Cigarettes    Quit date: 08/01/2008  . Smokeless tobacco: Never Used  . Alcohol Use: Yes     Comment: occasional  . Drug Use: No  . Sexual Activity: Not on file   Other Topics Concern  . Not on file   Social History Narrative   Family History  Problem Relation Age of Onset  . Cancer Sister     ovarian/uterine/breast  . Cancer Sister     breast  . Heart disease Father   . Heart disease Brother     2 brothers  . Heart disease Sister   . Diabetes Maternal Grandmother       Objective:   Physical Exam  Constitutional: She is oriented to person, place, and time. She appears well-developed and well-nourished.   Cardiovascular: Normal rate and regular rhythm.   Pulmonary/Chest: Effort normal and breath sounds normal.  Neurological: She is alert and oriented to person, place, and time. No cranial nerve deficit.  Skin: Skin is warm and dry.  Psychiatric: She has a normal mood and affect. Her behavior is normal. Judgment and thought content normal.   BP 142/90 mmHg  Pulse 96  Temp(Src) 97.9 F (36.6 C) (Oral)  Resp 22  Wt 149 lb (67.586 kg)      Assessment & Plan:  1. COPD, severe (HCC) Only feels better after Prednisone.  Will refer to new pulmonologist.   - Ambulatory referral to Pulmonology  2. Benign essential HTN Stable. Continue current medication.    3. Senile purpura (HCC) Stable. Worse  since prednisone.   [name], MD

## 2015-08-24 ENCOUNTER — Ambulatory Visit: Payer: Medicare PPO

## 2015-08-27 ENCOUNTER — Other Ambulatory Visit: Payer: Self-pay | Admitting: Family Medicine

## 2015-08-27 DIAGNOSIS — F419 Anxiety disorder, unspecified: Secondary | ICD-10-CM

## 2015-08-27 NOTE — Telephone Encounter (Signed)
Printed, please fax or call in to pharmacy. Thank you.   

## 2015-08-29 ENCOUNTER — Ambulatory Visit (INDEPENDENT_AMBULATORY_CARE_PROVIDER_SITE_OTHER): Payer: Medicare PPO

## 2015-08-29 DIAGNOSIS — Z23 Encounter for immunization: Secondary | ICD-10-CM

## 2015-09-09 DIAGNOSIS — J4530 Mild persistent asthma, uncomplicated: Secondary | ICD-10-CM | POA: Diagnosis not present

## 2015-09-09 DIAGNOSIS — R0602 Shortness of breath: Secondary | ICD-10-CM | POA: Diagnosis not present

## 2015-09-13 ENCOUNTER — Ambulatory Visit (INDEPENDENT_AMBULATORY_CARE_PROVIDER_SITE_OTHER): Payer: Medicare PPO | Admitting: Family Medicine

## 2015-09-13 ENCOUNTER — Ambulatory Visit
Admission: RE | Admit: 2015-09-13 | Discharge: 2015-09-13 | Disposition: A | Discharge status: Home / Self Care | Payer: Medicare PPO | Source: Ambulatory Visit | Attending: Family Medicine | Admitting: Family Medicine

## 2015-09-13 ENCOUNTER — Encounter: Payer: Self-pay | Admitting: Family Medicine

## 2015-09-13 VITALS — BP 108/62 | HR 112 | Temp 97.5°F | Resp 20 | Wt 151.0 lb

## 2015-09-13 DIAGNOSIS — I10 Essential (primary) hypertension: Secondary | ICD-10-CM | POA: Diagnosis not present

## 2015-09-13 DIAGNOSIS — R0602 Shortness of breath: Secondary | ICD-10-CM | POA: Insufficient documentation

## 2015-09-13 DIAGNOSIS — I471 Supraventricular tachycardia: Secondary | ICD-10-CM

## 2015-09-13 DIAGNOSIS — L509 Urticaria, unspecified: Secondary | ICD-10-CM | POA: Insufficient documentation

## 2015-09-13 DIAGNOSIS — J441 Chronic obstructive pulmonary disease with (acute) exacerbation: Secondary | ICD-10-CM | POA: Insufficient documentation

## 2015-09-13 DIAGNOSIS — R220 Localized swelling, mass and lump, head: Secondary | ICD-10-CM

## 2015-09-13 DIAGNOSIS — R609 Edema, unspecified: Secondary | ICD-10-CM | POA: Insufficient documentation

## 2015-09-13 DIAGNOSIS — R05 Cough: Secondary | ICD-10-CM | POA: Diagnosis not present

## 2015-09-13 MED ORDER — LORAZEPAM 0.5 MG PO TABS: 0.5000 mg | ORAL_TABLET | Freq: Three times a day (TID) | ORAL | Status: DC | PRN

## 2015-09-13 MED ORDER — PREDNISONE 10 MG PO TABS: ORAL_TABLET | ORAL | Status: DC

## 2015-09-13 MED ORDER — DOXEPIN HCL 10 MG PO CAPS: 10.0000 mg | ORAL_CAPSULE | Freq: Every day | ORAL | Status: DC

## 2015-09-13 MED ORDER — AMOXICILLIN-POT CLAVULANATE 875-125 MG PO TABS: 1.0000 | ORAL_TABLET | Freq: Two times a day (BID) | ORAL | Status: DC

## 2015-09-13 NOTE — Progress Notes (Signed)
Patient ID: Lori Brady, female   DOB: 1945-11-29, 69 y.o.   MRN: [id number]     Visit Date: 09/13/2015  Today's Provider: [name], MD   Chief Complaint  Patient presents with  . Shortness of Breath   Subjective:    HPI  Patient states that she is having shortness of breath. Patient started to feel bad on Tuesday October 25th, symptoms started with nasal congestion and moved down in her chest. She felt the worse today. She has chest tightness, chest congestion, shortness of breath, chills and sweats. No fever. She has used her inhalers and nebulizer as directed. Her Symbicort was changed to [facility] recently by pulmonologist, Dr. Welton Flakes.  Bed bound since she has felt so bad.. Low appetite.  No PND. Also, has diarrhea today and decreased urine output.  Last time she was on Prednisone was a month ago, on October 5. Was better until Monday.  Feels really anxious. Just feels bad. Can not really articulate why.         Allergies  Allergen Reactions  . Levaquin [Levofloxacin] Nausea Only  . Vicodin [Hydrocodone-Acetaminophen]     nausea   Previous Medications   ALBUTEROL (ACCUNEB) 0.63 MG/3ML NEBULIZER SOLUTION    Take 1 ampule by nebulization every 6 (six) hours as needed for wheezing.   ALBUTEROL (PROVENTIL HFA;VENTOLIN HFA) 108 (90 BASE) MCG/ACT INHALER    Inhale 2 puffs into the lungs every 6 (six) hours as needed for wheezing or shortness of breath.   ALENDRONATE (FOSAMAX) 70 MG TABLET    Take by mouth.   ALPRAZOLAM (XANAX) 0.5 MG TABLET    TAKE 1 TABLET BY MOUTH TWICE DAILY AS NEEDED FOR ANXIETY   ASPIRIN 81 MG TABLET    Take 81 mg by mouth daily.   BETAMETHASONE VALERATE (VALISONE) 0.1 % CREAM    APPLY TOPICALLY TO AFFECTED AREA SPARINGLY 2 TIMES DAILY.   CHLORTHALIDONE (HYGROTON) 25 MG TABLET    Take 25 mg by mouth daily.   CHOLECALCIFEROL (VITAMIN D) 2000 UNITS CAPS    Take 1 capsule by mouth daily.   CITALOPRAM (CELEXA) 20 MG TABLET    Take 1 tablet (20 mg total) by mouth  daily. 1/2 a day for 1 week and then one a daily   FLUTICASONE (FLONASE) 50 MCG/ACT NASAL SPRAY    USE 2 SPRAYS IN EACH NOSTRIL ONCE DAILY.   FLUTICASONE FUROATE-VILANTEROL (BREO ELLIPTA) 100-25 MCG/INH AEPB    Inhale into the lungs.   LISINOPRIL (PRINIVIL,ZESTRIL) 10 MG TABLET    Take 10 mg by mouth daily.   MONTELUKAST (SINGULAIR) 10 MG TABLET    Take 10 mg by mouth at bedtime.   OMEGA-3 FATTY ACIDS (FISH OIL) 1000 MG CAPS    Take 1 capsule by mouth daily.   OMEPRAZOLE (PRILOSEC) 10 MG CAPSULE    Take by mouth.   POTASSIUM CHLORIDE ER 20 MEQ TBCR    Take by mouth.   PREDNISONE (DELTASONE) 10 MG TABLET    6 po for 2 days and then 5 po for 2 days and then 4 po for 2 days and 3 po for 2 days and then 2 po for 2 days and then 1 po for 2 days.   SIMVASTATIN (ZOCOR) 40 MG TABLET    Take 40 mg by mouth daily.   SPACER/AERO-HOLDING CHAMBERS (AEROCHAMBER MV) INHALER    Use as instructed with inhaler.  DX 496.   TIOTROPIUM (SPIRIVA) 18 MCG INHALATION CAPSULE    Place 1  capsule (18 mcg total) into inhaler and inhale daily.   ZOLPIDEM (AMBIEN) 5 MG TABLET    Take by mouth.    Review of Systems  Constitutional: Positive for chills and fatigue.  HENT: Positive for congestion.   Respiratory: Positive for chest tightness, shortness of breath and wheezing.   Cardiovascular: Negative.   Neurological: Positive for weakness.    Social History  Substance Use Topics  . Smoking status: Former Smoker -- 1.00 packs/day for 30 years    Types: Cigarettes    Quit date: 08/01/2008  . Smokeless tobacco: Never Used  . Alcohol Use: Yes     Comment: occasional   Objective:   BP 108/62 mmHg  Pulse 112  Temp(Src) 97.5 F (36.4 C)  Resp 20  Wt 151 lb (68.493 kg)  Physical Exam  Constitutional: She is oriented to person, place, and time. She appears well-developed and well-nourished.  HENT:  Head: Normocephalic and atraumatic.  Right Ear: External ear normal.  Left Ear: External ear normal.  Nose: Nose  normal.  Mouth/Throat: Oropharynx is clear and moist.  Swollen  Left parotid.   Neck: Normal range of motion. Neck supple.  Cardiovascular: Normal rate and regular rhythm.   Pulmonary/Chest: Effort normal and breath sounds normal.  Neurological: She is alert and oriented to person, place, and time.  Psychiatric: She has a normal mood and affect. Her behavior is normal. Judgment and thought content normal.      Assessment & Plan:     1. Parotid swelling Referral to ENT to evaluate and treat.   - Ambulatory referral to ENT  2. Atrial tachycardia (HCC) Check labs.  - TSH  3. Benign essential HTN Will check labs.  - CBC with Differential/Platelet - Comprehensive metabolic panel  4. COPD exacerbation (HCC) Worsening Also with some increased anxiety. Will start medication as below,  CXR, stop Xanax and recheck pending these results.  - LORazepam (ATIVAN) 0.5 MG tablet; Take 1 tablet (0.5 mg total) by mouth every 8 (eight) hours as needed for anxiety.  Dispense: 90 tablet; Refill: 1 - DG Chest 2 View; Future - predniSONE (DELTASONE) 10 MG tablet; 6 po for 2 days and then 5 po for 2 days and then 4 po for 2 days and 3 po for 2 days and then 2 po for 2 days and then 1 po for 2 days.  Dispense: 42 tablet; Refill: 0 - amoxicillin-clavulanate (AUGMENTIN) 875-125 MG tablet; Take 1 tablet by mouth 2 (two) times daily.  Dispense: 20 tablet; Refill: 0  5. Shortness of breath Will check labs and CXR.  - B Nat Peptide - DG Chest 2 View; Future  6. Urticaria Worsening. Will add Doxepin.  - doxepin (SINEQUAN) 10 MG capsule; Take 1 capsule (10 mg total) by mouth at bedtime.  Dispense: 30 capsule; Refill: 5     [name], MD  [facility]

## 2015-09-14 LAB — CBC WITH DIFFERENTIAL/PLATELET
BASOS ABS: 0.1 10*3/uL (ref 0.0–0.2)
Basos: 0 %
EOS (ABSOLUTE): 0.5 10*3/uL — AB (ref 0.0–0.4)
Eos: 3 %
HEMATOCRIT: 41.5 % (ref 34.0–46.6)
HEMOGLOBIN: 14 g/dL (ref 11.1–15.9)
IMMATURE GRANS (ABS): 0.3 10*3/uL — AB (ref 0.0–0.1)
IMMATURE GRANULOCYTES: 2 %
LYMPHS: 14 %
Lymphocytes Absolute: 2.2 10*3/uL (ref 0.7–3.1)
MCH: 31.8 pg (ref 26.6–33.0)
MCHC: 33.7 g/dL (ref 31.5–35.7)
MCV: 94 fL (ref 79–97)
Monocytes Absolute: 1.4 10*3/uL — ABNORMAL HIGH (ref 0.1–0.9)
Monocytes: 9 %
NEUTROS PCT: 72 %
Neutrophils Absolute: 11.4 10*3/uL — ABNORMAL HIGH (ref 1.4–7.0)
Platelets: 267 10*3/uL (ref 150–379)
RBC: 4.4 x10E6/uL (ref 3.77–5.28)
RDW: 14.2 % (ref 12.3–15.4)
WBC: 15.8 10*3/uL — ABNORMAL HIGH (ref 3.4–10.8)

## 2015-09-14 LAB — COMPREHENSIVE METABOLIC PANEL
A/G RATIO: 1.9 (ref 1.1–2.5)
ALT: 140 IU/L — ABNORMAL HIGH (ref 0–32)
AST: 89 IU/L — ABNORMAL HIGH (ref 0–40)
Albumin: 4.8 g/dL (ref 3.6–4.8)
Alkaline Phosphatase: 63 IU/L (ref 39–117)
BUN/Creatinine Ratio: 16 (ref 11–26)
BUN: 17 mg/dL (ref 8–27)
Bilirubin Total: 1.4 mg/dL — ABNORMAL HIGH (ref 0.0–1.2)
CALCIUM: 9.9 mg/dL (ref 8.7–10.3)
CO2: 25 mmol/L (ref 18–29)
Chloride: 90 mmol/L — ABNORMAL LOW (ref 97–106)
Creatinine, Ser: 1.05 mg/dL — ABNORMAL HIGH (ref 0.57–1.00)
GFR, EST AFRICAN AMERICAN: 64 mL/min/{1.73_m2} (ref >59)
GFR, EST NON AFRICAN AMERICAN: 55 mL/min/{1.73_m2} — AB (ref >59)
GLOBULIN, TOTAL: 2.5 g/dL (ref 1.5–4.5)
Glucose: 109 mg/dL — ABNORMAL HIGH (ref 65–99)
POTASSIUM: 4.1 mmol/L (ref 3.5–5.2)
SODIUM: 136 mmol/L (ref 136–144)
TOTAL PROTEIN: 7.3 g/dL (ref 6.0–8.5)

## 2015-09-14 LAB — TSH: TSH: 1.74 u[IU]/mL (ref 0.450–4.500)

## 2015-09-14 LAB — BRAIN NATRIURETIC PEPTIDE: BNP: 9.4 pg/mL (ref 0.0–100.0)

## 2015-09-17 ENCOUNTER — Encounter: Payer: Self-pay | Admitting: Student

## 2015-09-17 ENCOUNTER — Telehealth: Payer: Self-pay

## 2015-09-17 DIAGNOSIS — K760 Fatty (change of) liver, not elsewhere classified: Secondary | ICD-10-CM | POA: Insufficient documentation

## 2015-09-17 NOTE — Telephone Encounter (Signed)
Pt advised as directed below.  She is going to call in about two weeks for a lab sheet.   Thanks,   -Mickel Baas

## 2015-09-17 NOTE — Telephone Encounter (Signed)
-----  Message from Margarita Rana, MD sent at 09/15/2015  9:54 AM EDT ----- Labs show with increased WBCs as previous.  Also,  Liver enzymes are elevated.   BNP shows no signs of  Heart failure.   Recheck met c and cbc in 2 weeks.  Thanks.

## 2015-09-24 ENCOUNTER — Other Ambulatory Visit: Payer: Self-pay | Admitting: Otolaryngology

## 2015-09-24 DIAGNOSIS — R220 Localized swelling, mass and lump, head: Secondary | ICD-10-CM

## 2015-09-24 DIAGNOSIS — K219 Gastro-esophageal reflux disease without esophagitis: Secondary | ICD-10-CM | POA: Diagnosis not present

## 2015-09-24 DIAGNOSIS — J3800 Paralysis of vocal cords and larynx, unspecified: Secondary | ICD-10-CM

## 2015-09-24 DIAGNOSIS — J3801 Paralysis of vocal cords and larynx, unilateral: Secondary | ICD-10-CM | POA: Diagnosis not present

## 2015-09-24 DIAGNOSIS — D37030 Neoplasm of uncertain behavior of the parotid salivary glands: Secondary | ICD-10-CM | POA: Diagnosis not present

## 2015-09-24 DIAGNOSIS — H6121 Impacted cerumen, right ear: Secondary | ICD-10-CM | POA: Diagnosis not present

## 2015-10-03 ENCOUNTER — Ambulatory Visit
Admission: RE | Admit: 2015-10-03 | Discharge: 2015-10-03 | Disposition: A | Discharge status: Home / Self Care | Payer: Medicare PPO | Source: Ambulatory Visit | Attending: Otolaryngology | Admitting: Otolaryngology

## 2015-10-03 DIAGNOSIS — J3800 Paralysis of vocal cords and larynx, unspecified: Secondary | ICD-10-CM | POA: Diagnosis present

## 2015-10-03 DIAGNOSIS — R6889 Other general symptoms and signs: Secondary | ICD-10-CM | POA: Diagnosis not present

## 2015-10-03 DIAGNOSIS — I6529 Occlusion and stenosis of unspecified carotid artery: Secondary | ICD-10-CM | POA: Diagnosis not present

## 2015-10-03 DIAGNOSIS — I700 Atherosclerosis of aorta: Secondary | ICD-10-CM | POA: Diagnosis not present

## 2015-10-03 DIAGNOSIS — J383 Other diseases of vocal cords: Secondary | ICD-10-CM | POA: Insufficient documentation

## 2015-10-03 DIAGNOSIS — R220 Localized swelling, mass and lump, head: Secondary | ICD-10-CM

## 2015-10-03 MED ORDER — IOHEXOL 300 MG/ML  SOLN
75.0000 mL | Freq: Once | INTRAMUSCULAR | Status: AC | PRN
  Administered 2015-10-03: 75 mL via INTRAVENOUS

## 2015-10-04 ENCOUNTER — Other Ambulatory Visit: Payer: Self-pay | Admitting: Family Medicine

## 2015-10-04 DIAGNOSIS — E876 Hypokalemia: Secondary | ICD-10-CM

## 2015-10-09 DIAGNOSIS — R0602 Shortness of breath: Secondary | ICD-10-CM | POA: Diagnosis not present

## 2015-10-09 LAB — PULMONARY FUNCTION TEST

## 2015-10-14 DIAGNOSIS — R05 Cough: Secondary | ICD-10-CM | POA: Diagnosis not present

## 2015-10-14 DIAGNOSIS — R490 Dysphonia: Secondary | ICD-10-CM | POA: Diagnosis not present

## 2015-10-14 DIAGNOSIS — J3801 Paralysis of vocal cords and larynx, unilateral: Secondary | ICD-10-CM | POA: Diagnosis not present

## 2015-10-15 ENCOUNTER — Other Ambulatory Visit: Payer: Self-pay | Admitting: Family Medicine

## 2015-10-15 DIAGNOSIS — F419 Anxiety disorder, unspecified: Secondary | ICD-10-CM

## 2015-10-17 DIAGNOSIS — K219 Gastro-esophageal reflux disease without esophagitis: Secondary | ICD-10-CM | POA: Diagnosis not present

## 2015-10-17 DIAGNOSIS — J449 Chronic obstructive pulmonary disease, unspecified: Secondary | ICD-10-CM | POA: Diagnosis not present

## 2015-10-17 DIAGNOSIS — J0140 Acute pansinusitis, unspecified: Secondary | ICD-10-CM | POA: Diagnosis not present

## 2015-10-30 DIAGNOSIS — L2089 Other atopic dermatitis: Secondary | ICD-10-CM | POA: Diagnosis not present

## 2015-10-30 DIAGNOSIS — D2339 Other benign neoplasm of skin of other parts of face: Secondary | ICD-10-CM | POA: Diagnosis not present

## 2015-10-30 DIAGNOSIS — S80812A Abrasion, left lower leg, initial encounter: Secondary | ICD-10-CM | POA: Diagnosis not present

## 2015-10-30 DIAGNOSIS — Z1283 Encounter for screening for malignant neoplasm of skin: Secondary | ICD-10-CM | POA: Diagnosis not present

## 2015-10-30 DIAGNOSIS — Z872 Personal history of diseases of the skin and subcutaneous tissue: Secondary | ICD-10-CM | POA: Diagnosis not present

## 2015-11-01 ENCOUNTER — Other Ambulatory Visit: Payer: Self-pay | Admitting: Family Medicine

## 2015-11-05 ENCOUNTER — Encounter: Payer: Self-pay | Admitting: Family Medicine

## 2015-11-06 ENCOUNTER — Other Ambulatory Visit: Payer: Self-pay | Admitting: Family Medicine

## 2015-11-21 ENCOUNTER — Other Ambulatory Visit: Payer: Self-pay | Admitting: Family Medicine

## 2015-11-21 DIAGNOSIS — F419 Anxiety disorder, unspecified: Secondary | ICD-10-CM

## 2015-11-21 NOTE — Telephone Encounter (Signed)
Printed, please fax or call in to pharmacy. Thank you.   

## 2015-12-11 ENCOUNTER — Other Ambulatory Visit: Payer: Self-pay | Admitting: Family Medicine

## 2015-12-11 DIAGNOSIS — J309 Allergic rhinitis, unspecified: Secondary | ICD-10-CM

## 2016-01-01 ENCOUNTER — Other Ambulatory Visit: Payer: Self-pay | Admitting: Family Medicine

## 2016-01-01 DIAGNOSIS — E782 Mixed hyperlipidemia: Secondary | ICD-10-CM

## 2016-01-01 NOTE — Telephone Encounter (Signed)
Lipids due. Lori Brady, CMA

## 2016-01-07 ENCOUNTER — Other Ambulatory Visit: Payer: Self-pay

## 2016-01-07 MED ORDER — ALBUTEROL SULFATE HFA 108 (90 BASE) MCG/ACT IN AERS: 2.0000 | INHALATION_SPRAY | Freq: Four times a day (QID) | RESPIRATORY_TRACT | Status: DC | PRN

## 2016-01-14 ENCOUNTER — Encounter: Payer: Self-pay | Admitting: Family Medicine

## 2016-01-14 ENCOUNTER — Ambulatory Visit (INDEPENDENT_AMBULATORY_CARE_PROVIDER_SITE_OTHER): Payer: Medicare Other | Admitting: Family Medicine

## 2016-01-14 VITALS — BP 118/52 | HR 94 | Temp 97.9°F | Resp 18 | Wt 157.4 lb

## 2016-01-14 DIAGNOSIS — J069 Acute upper respiratory infection, unspecified: Secondary | ICD-10-CM | POA: Diagnosis not present

## 2016-01-14 MED ORDER — PREDNISONE 20 MG PO TABS
ORAL_TABLET | ORAL | Status: DC
Start: 2016-01-14 — End: 2016-03-31

## 2016-01-14 MED ORDER — DOXYCYCLINE HYCLATE 100 MG PO TABS
100.0000 mg | ORAL_TABLET | Freq: Two times a day (BID) | ORAL | Status: DC
Start: 2016-01-14 — End: 2016-03-31

## 2016-01-14 NOTE — Patient Instructions (Addendum)
Continue saline nasal spray and add Mucinex to loosen sputum and Delsym for cough as needed. Schedule Proventil inhaler at least twice daily while ill. If cold symptoms not improving by Saturday or increased shortness of breath to start antibiotic and prednisone.

## 2016-01-14 NOTE — Progress Notes (Signed)
Subjective:     Patient ID: Lori Brady, female   DOB: [birth year], 71 y.o.   MRN: [id number]  HPI  Chief Complaint  Patient presents with  . Cough    Patient comes in office today with conerns of cough since Saturday 2/25. Patient states over the weekend she developed a low grade fever, sneezing sore throat and headache. Patient reports that she does have shortness of breath and wheezing but it has remained under control, but now has concerns because she has been more congested. Patient reports taking Ibuprofen to help with fever.   States her breathing is under control with Breo and Proventil inhaler which she has used only 1-2 times. States she sees her pulmonary doctor, Dr. Park Breed, on 3/2.   Review of Systems     Objective:   Physical Exam  Constitutional: She appears well-developed and well-nourished. No distress.  Ears: T.M's intact without inflammation Sinuses: non-tender Throat: no tonsillar enlargement or exudate Neck: no cervical adenopathy Lungs: clear     Assessment:    1. Upper respiratory infection - doxycycline (VIBRA-TABS) 100 MG tablet; Take 1 tablet (100 mg total) by mouth 2 (two) times daily.  Dispense: 20 tablet; Refill: 0 - predniSONE (DELTASONE) 20 MG tablet; Taper as follows: 3 pills for 4 days, two pills for 4 days, one pill for four days  Dispense: 24 tablet; Refill: 0    Plan:    Discussed symptomatic treatment. To start abx and prednisone if sx not improving at 7 days or increased shortness of breath.

## 2016-02-15 ENCOUNTER — Other Ambulatory Visit: Payer: Self-pay | Admitting: Family Medicine

## 2016-02-15 DIAGNOSIS — F419 Anxiety disorder, unspecified: Secondary | ICD-10-CM

## 2016-03-31 ENCOUNTER — Ambulatory Visit (INDEPENDENT_AMBULATORY_CARE_PROVIDER_SITE_OTHER): Payer: Medicare Other | Admitting: Family Medicine

## 2016-03-31 ENCOUNTER — Encounter: Payer: Self-pay | Admitting: Family Medicine

## 2016-03-31 VITALS — BP 120/58 | HR 104 | Temp 98.3°F | Resp 20 | Ht 63.0 in | Wt 155.0 lb

## 2016-03-31 DIAGNOSIS — F419 Anxiety disorder, unspecified: Secondary | ICD-10-CM | POA: Diagnosis not present

## 2016-03-31 DIAGNOSIS — J45909 Unspecified asthma, uncomplicated: Secondary | ICD-10-CM | POA: Insufficient documentation

## 2016-03-31 DIAGNOSIS — J449 Chronic obstructive pulmonary disease, unspecified: Secondary | ICD-10-CM | POA: Diagnosis not present

## 2016-03-31 DIAGNOSIS — I10 Essential (primary) hypertension: Secondary | ICD-10-CM | POA: Diagnosis not present

## 2016-03-31 DIAGNOSIS — E782 Mixed hyperlipidemia: Secondary | ICD-10-CM

## 2016-03-31 DIAGNOSIS — Z1239 Encounter for other screening for malignant neoplasm of breast: Secondary | ICD-10-CM

## 2016-03-31 DIAGNOSIS — Z0000 Encounter for general adult medical examination without abnormal findings: Secondary | ICD-10-CM

## 2016-03-31 DIAGNOSIS — M810 Age-related osteoporosis without current pathological fracture: Secondary | ICD-10-CM | POA: Insufficient documentation

## 2016-03-31 DIAGNOSIS — Z1211 Encounter for screening for malignant neoplasm of colon: Secondary | ICD-10-CM

## 2016-03-31 NOTE — Progress Notes (Signed)
Patient ID: Lori Brady, female   DOB: 02/19/1946, 70 y.o.   MRN: [id number]       Patient: Lori Brady, Female    DOB: 04/22/1946, 70 y.o.   MRN: [id number] Visit Date: 03/31/2016  Today's Provider: [name], MD   Chief Complaint  Patient presents with  . Medicare Wellness   Subjective:    Annual wellness visit Lori Brady is a 70 y.o. female. She feels well. She reports exercising none. She reports she is sleeping well.  01/10/15 AWE 01/15/10 Pap-neg; HPV-neg 02/22/15 Mammogram-BI-RADS 1 04/21/10 Colonoscopy-chronic active colitis 01/25/12 BMD-osteoporosis  -----------------------------------------------------------   Review of Systems  Constitutional: Positive for fatigue.  HENT: Negative.   Eyes: Negative.   Respiratory: Negative.   Cardiovascular: Negative.   Gastrointestinal: Negative.   Endocrine: Positive for heat intolerance.  Genitourinary: Negative.   Musculoskeletal: Negative.   Skin: Positive for rash.  Allergic/Immunologic: Negative.   Neurological: Positive for weakness.  Hematological: Bruises/bleeds easily.  Psychiatric/Behavioral: Negative.     Social History   Social History  . Marital Status: Widowed    Spouse Name: N/A  . Number of Children: N/A  . Years of Education: N/A   Occupational History  . Not on file.   Social History Main Topics  . Smoking status: Former Smoker -- 1.00 packs/day for 30 years    Types: Cigarettes    Quit date: 08/01/2008  . Smokeless tobacco: Never Used  . Alcohol Use: Yes     Comment: occasional  . Drug Use: No  . Sexual Activity: Not on file   Other Topics Concern  . Not on file   Social History Narrative    Past Medical History  Diagnosis Date  . Asthma   . Arthritis   . History of chicken pox   . Emphysema of lung (HCC)   . GERD (gastroesophageal reflux disease)   . Seasonal allergies   . Hypertension   . Hypercholesteremia   . Hx of completed stroke   . History of heart  attack   . UTI (lower urinary tract infection)   . Crohn's disease [facility])      Patient Active Problem List   Diagnosis Date Noted  . Asthma without status asthmaticus 03/31/2016  . Osteoporosis 03/31/2016  . Elevated LFTs 09/17/2015  . Parotid swelling 09/13/2015  . COPD exacerbation (HCC) 09/13/2015  . Shortness of breath 09/13/2015  . Urticaria 09/13/2015  . Cerebrovascular accident (CVA) (HCC) 08/21/2015  . Senile purpura (HCC) 08/21/2015  . Atrial tachycardia (HCC) 08/13/2015  . Eczema 07/10/2015  . Allergic rhinitis 06/26/2015  . Hyperkalemia 05/03/2015  . Anxiety 04/23/2015  . Edema leg 03/27/2015  . Fatigue 03/06/2015  . Primary cardiomyopathy (HCC) 03/05/2015  . Benign essential HTN 02/28/2015  . Combined fat and carbohydrate induced hyperlipemia 09/06/2014  . COPD, severe (HCC) 08/01/2014  . Acid reflux 08/01/2014  . Cough 08/01/2014    Past Surgical History  Procedure Laterality Date  . Breast biopsy      Her family history includes Cancer in her brother, father, mother, sister, and sister; Diabetes in her maternal grandmother; Heart disease in her brother, father, sister, and sister.    Previous Medications   ALBUTEROL (ACCUNEB) 0.63 MG/3ML NEBULIZER SOLUTION    Take 1 ampule by nebulization every 6 (six) hours as needed for wheezing.   ALENDRONATE (FOSAMAX) 70 MG TABLET    Take 70 mg by mouth once a week.    ASPIRIN 81 MG TABLET  Take 81 mg by mouth daily.   BETAMETHASONE VALERATE (VALISONE) 0.1 % CREAM    APPLY TOPICALLY TO AFFECTED AREA SPARINGLY 2 TIMES DAILY.   CARVEDILOL (COREG) 3.125 MG TABLET    Take 1 tablet by mouth daily.   CHLORTHALIDONE (HYGROTON) 25 MG TABLET    Take 25 mg by mouth daily.   CHOLECALCIFEROL (VITAMIN D) 2000 UNITS CAPS    Take 1 capsule by mouth daily.   CITALOPRAM (CELEXA) 20 MG TABLET    TAKE 1 TABLET BY MOUTH ONCE DAILY.   DOXEPIN (SINEQUAN) 25 MG CAPSULE    Take 25 mg by mouth at bedtime.    FLUTICASONE (FLONASE) 50 MCG/ACT  NASAL SPRAY    USE 2 SPRAYS IN EACH NOSTRIL ONCE DAILY.   FLUTICASONE FUROATE-VILANTEROL (BREO ELLIPTA) 100-25 MCG/INH AEPB    Inhale 1 puff into the lungs daily.    LISINOPRIL (PRINIVIL,ZESTRIL) 20 MG TABLET       LORAZEPAM (ATIVAN) 0.5 MG TABLET    TAKE 1 TABLET BY MOUTH EVERY 8 HOURS AS NEEDED FOR ANXIETY   MONTELUKAST (SINGULAIR) 10 MG TABLET    TAKE 1 TABLET BY MOUTH ONCE DAILY.   OMEGA-3 FATTY ACIDS (FISH OIL) 1000 MG CAPS    Take 1 capsule by mouth daily.   OMEPRAZOLE (PRILOSEC) 20 MG CAPSULE    Take 20 mg by mouth daily.   POTASSIUM CHLORIDE SA (K-DUR,KLOR-CON) 20 MEQ TABLET    TAKE 1 TABLET BY MOUTH TWICE DAILY   PROVENTIL HFA 108 (90 BASE) MCG/ACT INHALER    Inhale 1-2 puffs into the lungs every 4 (four) hours as needed.    SIMVASTATIN (ZOCOR) 40 MG TABLET    TAKE 1 TABLET BY MOUTH AT BEDTIME.   TIOTROPIUM (SPIRIVA) 18 MCG INHALATION CAPSULE    Place 1 capsule (18 mcg total) into inhaler and inhale daily.   TRIAMCINOLONE CREAM (KENALOG) 0.1 %        Patient Care Team: [name], MD as PCP - General (Family Medicine)     Objective:   Vitals: BP 120/58 mmHg  Pulse 104  Temp(Src) 98.3 F (36.8 C) (Oral)  Resp 20  Ht 5\' 3"  (1.6 m)  Wt 155 lb (70.308 kg)  BMI 27.46 kg/m2  SpO2 95%  Physical Exam  Constitutional: She is oriented to person, place, and time. She appears well-developed and well-nourished.  HENT:  Head: Normocephalic and atraumatic.  Right Ear: Tympanic membrane, external ear and ear canal normal.  Left Ear: Tympanic membrane, external ear and ear canal normal.  Nose: Nose normal.  Mouth/Throat: Uvula is midline, oropharynx is clear and moist and mucous membranes are normal.  Eyes: Conjunctivae, EOM and lids are normal. Pupils are equal, round, and reactive to light.  Neck: Trachea normal and normal range of motion. Neck supple. Carotid bruit is not present. No thyroid mass and no thyromegaly present.  Cardiovascular: Normal rate, regular rhythm and normal  heart sounds.   Pulmonary/Chest: Effort normal and breath sounds normal.  Abdominal: Soft. Normal appearance and bowel sounds are normal. There is no hepatosplenomegaly. There is no tenderness.  Genitourinary: Rectum normal. No breast swelling, tenderness or discharge.  Musculoskeletal: Normal range of motion.  Lymphadenopathy:    She has no cervical adenopathy.    She has no axillary adenopathy.  Neurological: She is alert and oriented to person, place, and time. She has normal strength. No cranial nerve deficit.  Skin: Skin is warm, dry and intact.  Psychiatric: She has a normal mood and  affect. Her speech is normal and behavior is normal. Judgment and thought content normal. Cognition and memory are normal.    Activities of Daily Living In your present state of health, do you have any difficulty performing the following activities: 03/31/2016  Hearing? N  Vision? Y  Difficulty concentrating or making decisions? Y  Walking or climbing stairs? N  Dressing or bathing? N  Doing errands, shopping? N    Fall Risk Assessment Fall Risk  03/31/2016  Falls in the past year? Yes  Number falls in past yr: 1  Injury with Fall? No     Depression Screen PHQ 2/9 Scores 03/31/2016  PHQ - 2 Score 0    Cognitive Testing - 6-CIT  Correct? Score   What year is it? yes 0 0 or 4  What month is it? yes 0 0 or 3  Memorize:    Floyde Parkins,  44,  [address],  [place],      What time is it? (within 1 hour) yes 0 0 or 3  Count backwards from 20 yes 0 0, 2, or 4  Name the months of the year yes 0 0, 2, or 4  Repeat name & address above yes 0 0, 2, 4, 6, 8, or 10       TOTAL SCORE  0/28   Interpretation:  Normal  Normal (0-7) Abnormal (8-28)    Assessment & Plan:     Annual Wellness Visit  Reviewed patient's Family Medical History Reviewed and updated list of patient's medical providers Assessment of cognitive impairment was done Assessed patient's functional ability Established a written  schedule for health screening services Health Risk Assessent Completed and Reviewed  Exercise Activities and Dietary recommendations Goals    None      Immunization History  Administered Date(s) Administered  . Influenza Split 08/01/2013, 08/16/2014  . Influenza, High Dose Seasonal PF 08/29/2015  . Influenza-Unspecified 08/16/2014  . Pneumococcal Conjugate-13 01/10/2015  . Pneumococcal Polysaccharide-23 08/01/2013       1. Medicare annual wellness visit, subsequent Stable. Patient advised to continue eating healthy and exercise daily.  2. Chronic obstructive pulmonary disease, unspecified COPD type (HCC) Stable. Patient doing much better since starting BREO. Patient advised to continue current medication and plan of care.  3. Colon cancer screening - IFOBT POC (occult bld, rslt in office)  4. Breast cancer screening - MM DIGITAL SCREENING BILATERAL; Future  5. Osteoporosis F/U pending lab report. Patient advised to continue current medication and plan of care. Patient to go off Fosamax next year after taking for 5 years. - DG Bone Density; Future  6. Anxiety - TSH  7. Benign essential HTN - CBC with Differential/Platelet - Comprehensive metabolic panel  8. Combined fat and carbohydrate induced hyperlipemia - Lipid Panel With LDL/HDL Ratio   Patient seen and examined by Dr. Leo Grosser, and note scribed by Liz Beach. Dimas, CMA.  I have reviewed the document for accuracy and completeness and I agree with above. Leo Grosser, MD   [name], MD    ------------------------------------------------------------------------------------------------------------

## 2016-04-03 ENCOUNTER — Telehealth: Payer: Self-pay

## 2016-04-03 DIAGNOSIS — R748 Abnormal levels of other serum enzymes: Secondary | ICD-10-CM

## 2016-04-03 LAB — CBC WITH DIFFERENTIAL/PLATELET
BASOS ABS: 0.1 10*3/uL (ref 0.0–0.2)
Basos: 1 %
EOS (ABSOLUTE): 0.8 10*3/uL — AB (ref 0.0–0.4)
Eos: 9 %
Hematocrit: 38.9 % (ref 34.0–46.6)
Hemoglobin: 13.2 g/dL (ref 11.1–15.9)
Immature Grans (Abs): 0.2 10*3/uL — ABNORMAL HIGH (ref 0.0–0.1)
Immature Granulocytes: 2 %
LYMPHS ABS: 2.4 10*3/uL (ref 0.7–3.1)
LYMPHS: 26 %
MCH: 31.5 pg (ref 26.6–33.0)
MCHC: 33.9 g/dL (ref 31.5–35.7)
MCV: 93 fL (ref 79–97)
Monocytes Absolute: 0.9 10*3/uL (ref 0.1–0.9)
Monocytes: 10 %
NEUTROS ABS: 4.9 10*3/uL (ref 1.4–7.0)
Neutrophils: 52 %
PLATELETS: 217 10*3/uL (ref 150–379)
RBC: 4.19 x10E6/uL (ref 3.77–5.28)
RDW: 13.6 % (ref 12.3–15.4)
WBC: 9.3 10*3/uL (ref 3.4–10.8)

## 2016-04-03 LAB — COMPREHENSIVE METABOLIC PANEL
ALK PHOS: 103 IU/L (ref 39–117)
ALT: 110 IU/L — AB (ref 0–32)
AST: 199 IU/L — AB (ref 0–40)
Albumin/Globulin Ratio: 1.7 (ref 1.2–2.2)
Albumin: 4.5 g/dL (ref 3.6–4.8)
BILIRUBIN TOTAL: 1 mg/dL (ref 0.0–1.2)
BUN/Creatinine Ratio: 14 (ref 12–28)
BUN: 11 mg/dL (ref 8–27)
CHLORIDE: 90 mmol/L — AB (ref 96–106)
CO2: 23 mmol/L (ref 18–29)
CREATININE: 0.76 mg/dL (ref 0.57–1.00)
Calcium: 9.4 mg/dL (ref 8.7–10.3)
GFR calc Af Amer: 93 mL/min/{1.73_m2} (ref >59)
GFR calc non Af Amer: 81 mL/min/{1.73_m2} (ref >59)
GLUCOSE: 102 mg/dL — AB (ref 65–99)
Globulin, Total: 2.7 g/dL (ref 1.5–4.5)
Potassium: 3.9 mmol/L (ref 3.5–5.2)
Sodium: 133 mmol/L — ABNORMAL LOW (ref 134–144)
Total Protein: 7.2 g/dL (ref 6.0–8.5)

## 2016-04-03 LAB — LIPID PANEL WITH LDL/HDL RATIO
CHOLESTEROL TOTAL: 141 mg/dL (ref 100–199)
HDL: 32 mg/dL — AB (ref >39)
LDL CALC: 78 mg/dL (ref 0–99)
LDl/HDL Ratio: 2.4 ratio units (ref 0.0–3.2)
TRIGLYCERIDES: 155 mg/dL — AB (ref 0–149)
VLDL CHOLESTEROL CAL: 31 mg/dL (ref 5–40)

## 2016-04-03 LAB — TSH: TSH: 4.28 u[IU]/mL (ref 0.450–4.500)

## 2016-04-03 NOTE — Telephone Encounter (Signed)
See lab results note. Informed pt of results and set up referral. Renaldo Fiddler, CMA

## 2016-04-09 ENCOUNTER — Other Ambulatory Visit: Payer: Self-pay | Admitting: Family Medicine

## 2016-04-09 DIAGNOSIS — J309 Allergic rhinitis, unspecified: Secondary | ICD-10-CM

## 2016-04-09 NOTE — Telephone Encounter (Signed)
LOV 03/31/2016. Allene Dillon, CMA

## 2016-04-15 ENCOUNTER — Ambulatory Visit
Admission: RE | Admit: 2016-04-15 | Discharge: 2016-04-15 | Disposition: A | Discharge status: Home / Self Care | Payer: Medicare PPO | Source: Ambulatory Visit | Attending: Internal Medicine | Admitting: Internal Medicine

## 2016-04-15 ENCOUNTER — Other Ambulatory Visit: Payer: Self-pay | Admitting: Internal Medicine

## 2016-04-15 DIAGNOSIS — R0602 Shortness of breath: Secondary | ICD-10-CM

## 2016-04-27 ENCOUNTER — Other Ambulatory Visit: Payer: Medicare PPO

## 2016-04-27 ENCOUNTER — Ambulatory Visit: Payer: Medicare PPO

## 2016-05-04 ENCOUNTER — Encounter (INDEPENDENT_AMBULATORY_CARE_PROVIDER_SITE_OTHER): Payer: Self-pay

## 2016-05-04 ENCOUNTER — Encounter: Payer: Self-pay | Admitting: Gastroenterology

## 2016-05-04 ENCOUNTER — Ambulatory Visit (INDEPENDENT_AMBULATORY_CARE_PROVIDER_SITE_OTHER): Payer: Medicare Other | Admitting: Gastroenterology

## 2016-05-04 VITALS — BP 154/71 | HR 106 | Temp 98.8°F | Ht 63.0 in | Wt 153.0 lb

## 2016-05-04 DIAGNOSIS — R748 Abnormal levels of other serum enzymes: Secondary | ICD-10-CM

## 2016-05-04 NOTE — Progress Notes (Signed)
Gastroenterology Consultation  Referring Provider:     [name], MD Primary Care Physician:  [name], MD Primary Gastroenterologist:  Dr. Servando Snare     Reason for Consultation:     Abnormal liver enzymes        HPI:   Lori Brady is a 70 y.o. y/o female referred for consultation & management of Abnormal liver enzymes by Dr. [name], MD.  This patient comes today with a history of abnormal liver enzymes. The patient's of her enzymes were elevated back in December of last year but were normal in April. The patient states that she has not been started on a new medications except that she takes intermittent steroids for her asthma. The patient has been on simvastatin for 9 years. The patient also reports that she has gained weight because of the steroids. There is no report of any high risk activity such as alcoholism. The patient also states that she only goes out with her sister's to have a margarita very infrequently and does not drink any heavier than that. There is no report of any history of drug abuse. She also denies any other new medications being started. The patient has had a colonoscopy within the last 10 years and states that she is not due for one for some time.  Past Medical History  Diagnosis Date  . Asthma   . Arthritis   . History of chicken pox   . Emphysema of lung (HCC)   . GERD (gastroesophageal reflux disease)   . Seasonal allergies   . Hypertension   . Hypercholesteremia   . Hx of completed stroke   . History of heart attack   . UTI (lower urinary tract infection)   . Crohn's disease Brylin Brady)     Past Surgical History  Procedure Laterality Date  . Breast biopsy      Prior to Admission medications   Medication Sig Start Date End Date Taking? Authorizing Provider  albuterol (ACCUNEB) 0.63 MG/3ML nebulizer solution Take 1 ampule by nebulization every 6 (six) hours as needed for wheezing.   Yes Historical Provider, MD  alendronate (FOSAMAX) 70 MG  tablet Take 70 mg by mouth once a week.  07/08/11  Yes Historical Provider, MD  aspirin 81 MG tablet Take 81 mg by mouth daily.   Yes Historical Provider, MD  betamethasone valerate (VALISONE) 0.1 % cream APPLY TOPICALLY TO AFFECTED AREA SPARINGLY 2 TIMES DAILY. 07/10/15  Yes [name], MD  chlorthalidone (HYGROTON) 25 MG tablet Take 25 mg by mouth daily.   Yes Historical Provider, MD  Cholecalciferol (VITAMIN D) 2000 UNITS CAPS Take 1 capsule by mouth daily.   Yes Historical Provider, MD  citalopram (CELEXA) 20 MG tablet TAKE 1 TABLET BY MOUTH ONCE DAILY. 02/17/16  Yes [name], MD  doxepin (SINEQUAN) 25 MG capsule Take 25 mg by mouth at bedtime.  01/06/16  Yes Historical Provider, MD  fluticasone (FLONASE) 50 MCG/ACT nasal spray USE 2 SPRAYS IN EACH NOSTRIL ONCE DAILY. 04/09/16  Yes [name], MD  Fluticasone Furoate-Vilanterol (BREO ELLIPTA) 100-25 MCG/INH AEPB Inhale 1 puff into the lungs daily.    Yes Historical Provider, MD  lisinopril (PRINIVIL,ZESTRIL) 20 MG tablet  01/01/16  Yes Historical Provider, MD  LORazepam (ATIVAN) 0.5 MG tablet TAKE 1 TABLET BY MOUTH EVERY 8 HOURS AS NEEDED FOR ANXIETY 11/21/15  Yes [name], MD  montelukast (SINGULAIR) 10 MG tablet TAKE 1 TABLET BY MOUTH ONCE DAILY. 12/11/15  Yes Margaretann Loveless,  PA-C  Omega-3 Fatty Acids (FISH OIL) 1000 MG CAPS Take 1 capsule by mouth daily.   Yes Historical Provider, MD  omeprazole (PRILOSEC) 20 MG capsule Take 20 mg by mouth daily.   Yes Historical Provider, MD  potassium chloride SA (K-DUR,KLOR-CON) 20 MEQ tablet TAKE 1 TABLET BY MOUTH TWICE DAILY Patient taking differently: TAKE 1 TABLET BY MOUTH DAILY 10/04/15  Yes [name], MD  PROVENTIL HFA 108 [phone] Base) MCG/ACT inhaler Inhale 1-2 puffs into the lungs every 4 (four) hours as needed.  01/03/16  Yes Historical Provider, MD  simvastatin (ZOCOR) 40 MG tablet TAKE 1 TABLET BY MOUTH AT BEDTIME. 01/01/16  Yes [name], MD  tiotropium (SPIRIVA) 18 MCG  inhalation capsule Place 1 capsule (18 mcg total) into inhaler and inhale daily. 08/05/15  Yes Merwyn Katos, MD  triamcinolone cream (KENALOG) 0.1 %  12/11/15  Yes Historical Provider, MD  omeprazole (PRILOSEC) 40 MG capsule Reported on 05/04/2016 04/30/16   Historical Provider, MD    Family History  Problem Relation Age of Onset  . Cancer Sister     ovarian/uterine/breast  . Cancer Sister     breast  . Heart disease Sister   . Heart disease Father   . Cancer Father     liver  . Heart disease Sister   . Diabetes Maternal Grandmother   . Cancer Mother   . Heart disease Brother   . Cancer Brother     liver     Social History  Substance Use Topics  . Smoking status: Former Smoker -- 1.00 packs/day for 30 years    Types: Cigarettes    Quit date: 08/01/2008  . Smokeless tobacco: Never Used  . Alcohol Use: Yes     Comment: occasional    Allergies as of 05/04/2016 - Review Complete 05/04/2016  Allergen Reaction Noted  . Levaquin [levofloxacin] Nausea Only 05/03/2015  . Vicodin [hydrocodone-acetaminophen]  08/01/2014    Review of Systems:    All systems reviewed and negative except where noted in HPI.   Physical Exam:  BP 154/71 mmHg  Pulse 106  Temp(Src) 98.8 F (37.1 C) (Oral)  Ht 5\' 3"  (1.6 m)  Wt 153 lb (69.4 kg)  BMI 27.11 kg/m2 No LMP recorded. Patient is postmenopausal. Psych:  Alert and cooperative. Normal mood and affect. General:   Alert,  Well-developed, well-nourished, pleasant and cooperative in NAD Head:  Normocephalic and atraumatic. Eyes:  Sclera clear, no icterus.   Conjunctiva pink. Ears:  Normal auditory acuity. Nose:  No deformity, discharge, or lesions. Mouth:  No deformity or lesions,oropharynx pink & moist. Neck:  Supple; no masses or thyromegaly. Lungs:  Respirations even and unlabored.  Clear throughout to auscultation.   No wheezes, crackles, or rhonchi. No acute distress. Heart:  Regular rate and rhythm; no murmurs, clicks, rubs, or  gallops. Abdomen:  Normal bowel sounds.  No bruits.  Soft, non-tender and non-distended without masses, hepatosplenomegaly or hernias noted.  No guarding or rebound tenderness.  Negative Carnett sign.   Rectal:  Deferred.  Msk:  Symmetrical without gross deformities.  Good, equal movement & strength bilaterally. Pulses:  Normal pulses noted. Extremities:  No clubbing or edema.  No cyanosis. Neurologic:  Alert and oriented x3;  grossly normal neurologically. Skin:  Intact without significant lesions or rashes.  No jaundice. Lymph Nodes:  No significant cervical adenopathy. Psych:  Alert and cooperative. Normal mood and affect.  Imaging Studies: Dg Chest 2 View  04/15/2016  CLINICAL DATA:  Chest  congestion.  Coughing. EXAM: CHEST  2 VIEW COMPARISON:  09/13/2015.  06/06/2014. FINDINGS: Mediastinum and hilar structures normal. Stable prominent callus right anterior third rib. Bilateral pleural parenchymal thickening consistent scarring. No acute infiltrate. Heart size normal. Atherosclerotic vascular changes thoracic aorta. Degenerative changes thoracic spine. IMPRESSION: 1. Stable prominent callus anterior right third rib. 2. Pleural-parenchymal scarring.  No acute cardiopulmonary disease. Electronically Signed   By: Maisie Fus  Register   On: 04/15/2016 14:32    Assessment and Plan:   Lori Brady is a 70 y.o. y/o female who comes in today with a history of abnormal liver enzymes that were starting to be elevated back in October of last year. The patient will have her labs sent for all common causes of abnormal liver enzymes. She will also have an ultrasound to rule out had a liver disease. Pending the results of her labs no further intervention will be undertaken at this time except the patient has been counseled on weight loss. Will be contacted with the results of her tests and further recommendations will follow. The patient has been explained the plan and agrees with it.   Note: This dictation  was prepared with Dragon dictation along with smaller phrase technology. Any transcriptional errors that result from this process are unintentional.

## 2016-05-05 LAB — HEPATITIS A ANTIBODY, TOTAL: Hep A Total Ab: POSITIVE — AB

## 2016-05-05 LAB — ANA: Anti Nuclear Antibody(ANA): NEGATIVE

## 2016-05-05 LAB — CERULOPLASMIN: Ceruloplasmin: 41.5 mg/dL — ABNORMAL HIGH (ref 19.0–39.0)

## 2016-05-05 LAB — ALPHA-1-ANTITRYPSIN: A-1 Antitrypsin: 178 mg/dL (ref 90–200)

## 2016-05-05 LAB — HEPATITIS B SURFACE ANTIGEN: HEP B S AG: NEGATIVE

## 2016-05-05 LAB — MITOCHONDRIAL ANTIBODIES: MITOCHONDRIAL AB: 4.3 U (ref 0.0–20.0)

## 2016-05-05 LAB — FERRITIN: FERRITIN: 202 ng/mL — AB (ref 15–150)

## 2016-05-05 LAB — ANTI-SMOOTH MUSCLE ANTIBODY, IGG: SMOOTH MUSCLE AB: 30 U — AB (ref 0–19)

## 2016-05-05 LAB — IRON AND TIBC
IRON SATURATION: 23 % (ref 15–55)
IRON: 84 ug/dL (ref 27–139)
Total Iron Binding Capacity: 370 ug/dL (ref 250–450)
UIBC: 286 ug/dL (ref 118–369)

## 2016-05-05 LAB — HEPATITIS B SURFACE ANTIBODY,QUALITATIVE: Hep B Surface Ab, Qual: NONREACTIVE

## 2016-05-06 ENCOUNTER — Ambulatory Visit
Admission: RE | Admit: 2016-05-06 | Discharge: 2016-05-06 | Disposition: A | Discharge status: Home / Self Care | Payer: Medicare Other | Source: Ambulatory Visit | Attending: Gastroenterology | Admitting: Gastroenterology

## 2016-05-06 DIAGNOSIS — R748 Abnormal levels of other serum enzymes: Secondary | ICD-10-CM | POA: Diagnosis present

## 2016-05-06 LAB — SPECIMEN STATUS REPORT

## 2016-05-06 LAB — HEPATITIS C ANTIBODY

## 2016-05-11 ENCOUNTER — Telehealth: Payer: Self-pay

## 2016-05-11 NOTE — Telephone Encounter (Signed)
-----   Message from Midge Minium, MD sent at 05/07/2016  1:01 PM EDT ----- Let the patient know that she will need to be vaccinated for hepatitis B and that one of her liver tests was positive for possible autoimmune hepatitis. The best way to confirm this as the diagnosis and to go forward with treatment would be to get a liver biopsy.

## 2016-05-11 NOTE — Telephone Encounter (Signed)
Pt notified of [place] results and lab results. Discuss Dr. Annabell Sabal recommendations of having a liver biopsy done to confirm diagnosis. Pt is okay with this. Contacting [facility] to schedule.

## 2016-05-11 NOTE — Telephone Encounter (Signed)
-----   Message from Midge Minium, MD sent at 05/07/2016 12:50 PM EDT ----- Let the patient know that the ultrasound showed fatty liver.

## 2016-05-12 ENCOUNTER — Other Ambulatory Visit: Payer: Self-pay

## 2016-05-12 DIAGNOSIS — R748 Abnormal levels of other serum enzymes: Secondary | ICD-10-CM

## 2016-05-25 ENCOUNTER — Other Ambulatory Visit: Payer: Self-pay | Admitting: Radiology

## 2016-05-25 ENCOUNTER — Other Ambulatory Visit: Payer: Self-pay | Admitting: Family Medicine

## 2016-05-25 ENCOUNTER — Encounter: Payer: Medicare PPO | Attending: Internal Medicine | Admitting: *Deleted

## 2016-05-25 ENCOUNTER — Telehealth: Payer: Self-pay

## 2016-05-25 VITALS — Ht 63.0 in | Wt 153.3 lb

## 2016-05-25 DIAGNOSIS — J449 Chronic obstructive pulmonary disease, unspecified: Secondary | ICD-10-CM

## 2016-05-25 DIAGNOSIS — F419 Anxiety disorder, unspecified: Secondary | ICD-10-CM

## 2016-05-25 MED ORDER — LORAZEPAM 0.5 MG PO TABS: 0.5000 mg | ORAL_TABLET | Freq: Three times a day (TID) | ORAL | Status: DC | PRN

## 2016-05-25 NOTE — Telephone Encounter (Signed)
Called Lorazepam into [facility] as prescribed. Allene Dillon, CMA

## 2016-05-25 NOTE — Progress Notes (Signed)
Pulmonary Individual Treatment Plan  Patient Details  Name: Lori Brady MRN: [id number] Date of Birth: [birth year] Referring Provider:        Pulmonary Rehab from 05/25/2016 in [facility] Cardiac and Pulmonary Rehab   Referring Provider  Beverely Risen, MD.      Initial Encounter Date:       Pulmonary Rehab from 05/25/2016 in [facility] Cardiac and Pulmonary Rehab   Date  05/25/16   Referring Provider  Beverely Risen, MD.      Visit Diagnosis: COPD, mild (HCC)  Patient's Home Medications on Admission:  Current outpatient prescriptions:  .  albuterol (ACCUNEB) 0.63 MG/3ML nebulizer solution, Take 1 ampule by nebulization every 6 (six) hours as needed for wheezing., Disp: , Rfl:  .  alendronate (FOSAMAX) 70 MG tablet, Take 70 mg by mouth once a week. , Disp: , Rfl:  .  aspirin 81 MG tablet, Take 81 mg by mouth daily., Disp: , Rfl:  .  betamethasone valerate (VALISONE) 0.1 % cream, APPLY TOPICALLY TO AFFECTED AREA SPARINGLY 2 TIMES DAILY., Disp: 15 g, Rfl: 5 .  chlorthalidone (HYGROTON) 25 MG tablet, Take 25 mg by mouth daily., Disp: , Rfl:  .  Cholecalciferol (VITAMIN D) 2000 UNITS CAPS, Take 1 capsule by mouth daily., Disp: , Rfl:  .  citalopram (CELEXA) 20 MG tablet, TAKE 1 TABLET BY MOUTH ONCE DAILY., Disp: 90 tablet, Rfl: 1 .  fluticasone (FLONASE) 50 MCG/ACT nasal spray, USE 2 SPRAYS IN EACH NOSTRIL ONCE DAILY., Disp: 16 g, Rfl: 11 .  Fluticasone Furoate-Vilanterol (BREO ELLIPTA) 100-25 MCG/INH AEPB, Inhale 1 puff into the lungs daily. , Disp: , Rfl:  .  lisinopril (PRINIVIL,ZESTRIL) 20 MG tablet, , Disp: , Rfl:  .  montelukast (SINGULAIR) 10 MG tablet, TAKE 1 TABLET BY MOUTH ONCE DAILY., Disp: 90 tablet, Rfl: 1 .  Omega-3 Fatty Acids (FISH OIL) 1000 MG CAPS, Take 1 capsule by mouth daily., Disp: , Rfl:  .  omeprazole (PRILOSEC) 20 MG capsule, Take 20 mg by mouth daily., Disp: , Rfl:  .  PROVENTIL HFA 108 (90 Base) MCG/ACT inhaler, Inhale 1-2 puffs into the lungs every 4 (four) hours as needed. ,  Disp: , Rfl:  .  simvastatin (ZOCOR) 40 MG tablet, TAKE 1 TABLET BY MOUTH AT BEDTIME., Disp: 90 tablet, Rfl: 3 .  tiotropium (SPIRIVA) 18 MCG inhalation capsule, Place 1 capsule (18 mcg total) into inhaler and inhale daily., Disp: 90 capsule, Rfl: 1 .  triamcinolone cream (KENALOG) 0.1 %, , Disp: , Rfl:  .  doxepin (SINEQUAN) 25 MG capsule, Take 25 mg by mouth at bedtime. , Disp: , Rfl:  .  LORazepam (ATIVAN) 0.5 MG tablet, Take 1 tablet (0.5 mg total) by mouth every 8 (eight) hours as needed. for anxiety, Disp: 90 tablet, Rfl: 5 .  omeprazole (PRILOSEC) 40 MG capsule, Reported on 05/04/2016, Disp: , Rfl:  .  potassium chloride SA (K-DUR,KLOR-CON) 20 MEQ tablet, TAKE 1 TABLET BY MOUTH TWICE DAILY (Patient taking differently: TAKE 1 TABLET BY MOUTH DAILY), Disp: 60 tablet, Rfl: 5  Past Medical History: Past Medical History  Diagnosis Date  . Asthma   . Arthritis   . History of chicken pox   . Emphysema of lung (HCC)   . GERD (gastroesophageal reflux disease)   . Seasonal allergies   . Hypertension   . Hypercholesteremia   . Hx of completed stroke   . History of heart attack   . UTI (lower urinary tract infection)   . Crohn's  disease (HCC)     Tobacco Use: History  Smoking status  . Former Smoker -- 1.00 packs/day for 30 years  . Types: Cigarettes  . Quit date: 08/01/2008  Smokeless tobacco  . Never Used    Labs: Recent Review Flowsheet Data    Labs for ITP Cardiac and Pulmonary Rehab Latest Ref Rng 12/07/2011 04/02/2016   Cholestrol 100 - 199 mg/dL 409 811   LDLCALC 0 - 99 mg/dL 63 78   HDL >91 mg/dL 56 47(W)   Trlycerides 0 - 149 mg/dL 71 295(A)       ADL UCSD:     Pulmonary Assessment Scores      05/25/16 1638       ADL UCSD   ADL Phase Entry     SOB Score total 56     Rest 0     Walk 3     Stairs 5     Bath 0     Dress 0     Shop 5        Pulmonary Function Assessment:     Pulmonary Function Assessment - 05/25/16 1634    Pulmonary Function Tests    RV% 67 %   DLCO% 51 %   Initial Spirometry Results   FVC% 68 %   FEV1% 60 %   FEV1/FVC Ratio 70   Post Bronchodilator Spirometry Results   FVC% 75 %   FEV1% 68 %   FEV1/FVC Ratio 72   Breath   Bilateral Breath Sounds Clear   Shortness of Breath No      Exercise Target Goals: Date: 05/25/16  Exercise Program Goal: Individual exercise prescription set with THRR, safety & activity barriers. Participant demonstrates ability to understand and report RPE using BORG scale, to self-measure pulse accurately, and to acknowledge the importance of the exercise prescription.  Exercise Prescription Goal: Starting with aerobic activity 30 plus minutes a day, 3 days per week for initial exercise prescription. Provide home exercise prescription and guidelines that participant acknowledges understanding prior to discharge.  Activity Barriers & Risk Stratification:   6 Minute Walk:     6 Minute Walk      05/25/16 1539       6 Minute Walk   Phase Initial     Distance 945 feet     Walk Time 6 minutes     # of Rest Breaks 0     MPH 1.79     METS 2.37     RPE 11     Perceived Dyspnea  2     VO2 Peak 8.3     Symptoms No     Resting HR 88 bpm     Resting BP 142/80 mmHg     Max Ex. HR 115 bpm     Max Ex. BP 158/82 mmHg     2 Minute Post BP 138/82 mmHg     Interval HR   Baseline HR 88     1 Minute HR 100     2 Minute HR 111     3 Minute HR 115     4 Minute HR 115     5 Minute HR 113     6 Minute HR 114     2 Minute Post HR 95     Interval Heart Rate? Yes     Interval Oxygen   Interval Oxygen? Yes     Baseline Oxygen Saturation % 96 %     Baseline Liters of Oxygen 0  L     1 Minute Oxygen Saturation % 94 %     1 Minute Liters of Oxygen 0 L     2 Minute Oxygen Saturation % 94 %     2 Minute Liters of Oxygen 0 L     3 Minute Oxygen Saturation % 94 %     3 Minute Liters of Oxygen 0 L     4 Minute Oxygen Saturation % 94 %     4 Minute Liters of Oxygen 0 L     5 Minute Oxygen  Saturation % 95 %     5 Minute Liters of Oxygen 0 L     6 Minute Oxygen Saturation % 95 %     6 Minute Liters of Oxygen 0 L     2 Minute Post Oxygen Saturation % 96 %     2 Minute Post Liters of Oxygen 0 L        Initial Exercise Prescription:     Initial Exercise Prescription - 05/25/16 1500    Date of Initial Exercise RX and Referring Provider   Date 05/25/16   Referring Provider Beverely Risen, MD.   Treadmill   MPH 1   Grade 0   Minutes 5   METs 2   Recumbant Bike   Level 2   RPM 50   Minutes 15   METs 2   NuStep   Level 2   Minutes 15   METs 2.5   Biostep-RELP   Level 2   Minutes 15   METs 2.5   Prescription Details   Frequency (times per week) 3   Duration Progress to 30 minutes of continuous aerobic without signs/symptoms of physical distress   Intensity   THRR 40-80% of Max Heartrate 113-140   Ratings of Perceived Exertion 11-13   Perceived Dyspnea 0-4   Progression   Progression Continue progressive overload as per policy without signs/symptoms or physical distress.   Resistance Training   Training Prescription Yes   Weight 2   Reps 10-12      Perform Capillary Blood Glucose checks as needed.  Exercise Prescription Changes:     Exercise Prescription Changes      05/25/16 1500           Response to Exercise   Blood Pressure (Admit) 142/80 mmHg       Blood Pressure (Exercise) 158/82 mmHg       Blood Pressure (Exit) 138/68 mmHg       Heart Rate (Admit) 88 bpm       Heart Rate (Exercise) 114 bpm       Heart Rate (Exit) 90 bpm       Oxygen Saturation (Admit) 96 %       Oxygen Saturation (Exercise) 95 %       Oxygen Saturation (Exit) 97 %       Rating of Perceived Exertion (Exercise) 11          Exercise Comments:   Discharge Exercise Prescription (Final Exercise Prescription Changes):     Exercise Prescription Changes - 05/25/16 1500    Response to Exercise   Blood Pressure (Admit) 142/80 mmHg   Blood Pressure (Exercise) 158/82 mmHg    Blood Pressure (Exit) 138/68 mmHg   Heart Rate (Admit) 88 bpm   Heart Rate (Exercise) 114 bpm   Heart Rate (Exit) 90 bpm   Oxygen Saturation (Admit) 96 %   Oxygen Saturation (Exercise) 95 %   Oxygen Saturation (Exit)  97 %   Rating of Perceived Exertion (Exercise) 11       Nutrition:  Target Goals: Understanding of nutrition guidelines, daily intake of sodium 1500mg , cholesterol 200mg , calories 30% from fat and 7% or less from saturated fats, daily to have 5 or more servings of fruits and vegetables.  Biometrics:     Pre Biometrics - 05/25/16 1545    Pre Biometrics   Height 5\' 3"  (1.6 m)   Weight 153 lb 4.8 oz (69.536 kg)   Waist Circumference 36.5 inches   Hip Circumference 41 inches   Waist to Hip Ratio 0.89 %   BMI (Calculated) 27.2       Nutrition Therapy Plan and Nutrition Goals:     Nutrition Therapy & Goals - 05/25/16 1627    Intervention Plan   Intervention Prescribe, educate and counsel regarding individualized specific dietary modifications aiming towards targeted core components such as weight, hypertension, lipid management, diabetes, heart failure and other comorbidities.   Expected Outcomes Short Term Goal: Understand basic principles of dietary content, such as calories, fat, sodium, cholesterol and nutrients.;Short Term Goal: A plan has been developed with personal nutrition goals set during dietitian appointment.;Long Term Goal: Adherence to prescribed nutrition plan.      Nutrition Discharge: Rate Your Plate Scores:   Psychosocial: Target Goals: Acknowledge presence or absence of depression, maximize coping skills, provide positive support system. Participant is able to verbalize types and ability to use techniques and skills needed for reducing stress and depression.  Initial Review & Psychosocial Screening:     Initial Psych Review & Screening - 05/25/16 1630    Initial Review   Current issues with History of Depression;Current Psychotropic  Meds   Family Dynamics   Good Support System? Yes  Has a very large family unit for support.  Husband passed away in 2004-03-28   Barriers   Psychosocial barriers to participate in program There are no identifiable barriers or psychosocial needs.;The patient should benefit from training in stress management and relaxation.   Screening Interventions   Interventions Encouraged to exercise      Quality of Life Scores:     Quality of Life - 05/25/16 1645    Quality of Life Scores   Health/Function Pre 18.13 %   Socioeconomic Pre 20 %   Psych/Spiritual Pre 19.67 %   Family Pre 20.25 %   GLOBAL Pre 19.09 %      PHQ-9:     Recent Review Flowsheet Data    Depression screen [facility] 2/9 05/25/2016 03/31/2016   Decreased Interest 2 0   Down, Depressed, Hopeless 0 0   PHQ - 2 Score 2 0   Altered sleeping 0 -   Tired, decreased energy 1 -   Change in appetite 1 -   Feeling bad or failure about yourself  0 -   Trouble concentrating 0 -   Moving slowly or fidgety/restless 0 -   Suicidal thoughts 0 -   PHQ-9 Score 4 -   Difficult doing work/chores Somewhat difficult  -      Psychosocial Evaluation and Intervention:   Psychosocial Re-Evaluation:  Education: Education Goals: Education classes will be provided on a weekly basis, covering required topics. Participant will state understanding/return demonstration of topics presented.  Learning Barriers/Preferences:     Learning Barriers/Preferences - 05/25/16 1631    Learning Barriers/Preferences   Learning Barriers Hearing  Deaf in Right ear since Stroke in 03-28-06   Learning Preferences None  Education Topics: Initial Evaluation Education: - Verbal, written and demonstration of respiratory meds, RPE/PD scales, oximetry and breathing techniques. Instruction on use of nebulizers and MDIs: cleaning and proper use, rinsing mouth with steroid doses and importance of monitoring MDI activations.          Pulmonary Rehab from 05/25/2016  in [facility] Cardiac and Pulmonary Rehab   Date  05/25/16   Educator  sb   Instruction Review Code  2- meets goals/outcomes      General Nutrition Guidelines/Fats and Fiber: -Group instruction provided by verbal, written material, models and posters to present the general guidelines for heart healthy nutrition. Gives an explanation and review of dietary fats and fiber.   Controlling Sodium/Reading Food Labels: -Group verbal and written material supporting the discussion of sodium use in heart healthy nutrition. Review and explanation with models, verbal and written materials for utilization of the food label.   Exercise Physiology & Risk Factors: - Group verbal and written instruction with models to review the exercise physiology of the cardiovascular system and associated critical values. Details cardiovascular disease risk factors and the goals associated with each risk factor.   Aerobic Exercise & Resistance Training: - Gives group verbal and written discussion on the health impact of inactivity. On the components of aerobic and resistive training programs and the benefits of this training and how to safely progress through these programs.   Flexibility, Balance, General Exercise Guidelines: - Provides group verbal and written instruction on the benefits of flexibility and balance training programs. Provides general exercise guidelines with specific guidelines to those with heart or lung disease. Demonstration and skill practice provided.   Stress Management: - Provides group verbal and written instruction about the health risks of elevated stress, cause of high stress, and healthy ways to reduce stress.   Depression: - Provides group verbal and written instruction on the correlation between heart/lung disease and depressed mood, treatment options, and the stigmas associated with seeking treatment.   Exercise & Equipment Safety: - Individual verbal instruction and demonstration of  equipment use and safety with use of the equipment.      Pulmonary Rehab from 05/25/2016 in [facility] Cardiac and Pulmonary Rehab   Date  05/25/16   Educator  SB   Instruction Review Code  2- meets goals/outcomes      Infection Prevention: - Provides verbal and written material to individual with discussion of infection control including proper hand washing and proper equipment cleaning during exercise session.      Pulmonary Rehab from 05/25/2016 in [facility] Cardiac and Pulmonary Rehab   Date  05/25/16   Educator  SB   Instruction Review Code  2- meets goals/outcomes      Falls Prevention: - Provides verbal and written material to individual with discussion of falls prevention and safety.      Pulmonary Rehab from 05/25/2016 in [facility] Cardiac and Pulmonary Rehab   Date  05/25/16   Educator  SB   Instruction Review Code  2- meets goals/outcomes      Diabetes: - Individual verbal and written instruction to review signs/symptoms of diabetes, desired ranges of glucose level fasting, after meals and with exercise. Advice that pre and post exercise glucose checks will be done for 3 sessions at entry of program.   Chronic Lung Diseases: - Group verbal and written instruction to review new updates, new respiratory medications, new advancements in procedures and treatments. Provide informative websites and "800" numbers of self-education.   Lung Procedures: - Group verbal  and written instruction to describe testing methods done to diagnose lung disease. Review the outcome of test results. Describe the treatment choices: Pulmonary Function Tests, ABGs and oximetry.   Energy Conservation: - Provide group verbal and written instruction for methods to conserve energy, plan and organize activities. Instruct on pacing techniques, use of adaptive equipment and posture/positioning to relieve shortness of breath.   Triggers: - Group verbal and written instruction to review types of environmental  controls: home humidity, furnaces, filters, dust mite/pet prevention, HEPA vacuums. To discuss weather changes, air quality and the benefits of nasal washing.   Exacerbations: - Group verbal and written instruction to provide: warning signs, infection symptoms, calling MD promptly, preventive modes, and value of vaccinations. Review: effective airway clearance, coughing and/or vibration techniques. Create an Sport and exercise psychologist.   Oxygen: - Individual and group verbal and written instruction on oxygen therapy. Includes supplement oxygen, available portable oxygen systems, continuous and intermittent flow rates, oxygen safety, concentrators, and Medicare reimbursement for oxygen.   Respiratory Medications: - Group verbal and written instruction to review medications for lung disease. Drug class, frequency, complications, importance of spacers, rinsing mouth after steroid MDI's, and proper cleaning methods for nebulizers.   AED/CPR: - Group verbal and written instruction with the use of models to demonstrate the basic use of the AED with the basic ABC's of resuscitation.   Breathing Retraining: - Provides individuals verbal and written instruction on purpose, frequency, and proper technique of diaphragmatic breathing and pursed-lipped breathing. Applies individual practice skills.      Pulmonary Rehab from 05/25/2016 in [facility] Cardiac and Pulmonary Rehab   Date  05/25/16   Educator  sb   Instruction Review Code  2- meets goals/outcomes      Anatomy and Physiology of the Lungs: - Group verbal and written instruction with the use of models to provide basic lung anatomy and physiology related to function, structure and complications of lung disease.   Heart Failure: - Group verbal and written instruction on the basics of heart failure: signs/symptoms, treatments, explanation of ejection fraction, enlarged heart and cardiomyopathy.   Sleep Apnea: - Individual verbal and written instruction to  review Obstructive Sleep Apnea. Review of risk factors, methods for diagnosing and types of masks and machines for OSA.   Anxiety: - Provides group, verbal and written instruction on the correlation between heart/lung disease and anxiety, treatment options, and management of anxiety.   Relaxation: - Provides group, verbal and written instruction about the benefits of relaxation for patients with heart/lung disease. Also provides patients with examples of relaxation techniques.   Knowledge Questionnaire Score:     Knowledge Questionnaire Score - 05/25/16 1632    Knowledge Questionnaire Score   Pre Score 6/10       Core Components/Risk Factors/Patient Goals at Admission:     Personal Goals and Risk Factors at Admission - 05/25/16 1628    Core Components/Risk Factors/Patient Goals on Admission    Weight Management Yes;Obesity   Admit Weight 153 lb 4.8 oz (69.536 kg)   Goal Weight: Short Term 145 lb (65.772 kg)   Goal Weight: Long Term 130 lb (58.968 kg)   Expected Outcomes Short Term: Continue to assess and modify interventions until short term weight is achieved;Weight Loss: Understanding of general recommendations for a balanced deficit meal plan, which promotes 1-2 lb weight loss per week and includes a negative energy balance of [phone] kcal/d   Increase Strength and Stamina Yes   Intervention Provide advice, education, support and counseling about  physical activity/exercise needs.;Develop an individualized exercise prescription for aerobic and resistive training based on initial evaluation findings, risk stratification, comorbidities and participant's personal goals.   Expected Outcomes Achievement of increased cardiorespiratory fitness and enhanced flexibility, muscular endurance and strength shown through measurements of functional capacity and personal statement of participant.   Improve shortness of breath with ADL's Yes   Intervention Provide education, individualized  exercise plan and daily activity instruction to help decrease symptoms of SOB with activities of daily living.   Expected Outcomes Short Term: Achieves a reduction of symptoms when performing activities of daily living.   Develop more efficient breathing techniques such as purse lipped breathing and diaphragmatic breathing; and practicing self-pacing with activity Yes  HAs several inhalers. Stated understanding of use and need to rinse with steriod inhaler.   Intervention Provide education, demonstration and support about specific breathing techniuqes utilized for more efficient breathing. Include techniques such as pursed lipped breathing, diaphragmatic breathing and self-pacing activity.   Expected Outcomes Short Term: Participant will be able to demonstrate and use breathing techniques as needed throughout daily activities.   Hypertension Yes   Intervention Provide education on lifestyle modifcations including regular physical activity/exercise, weight management, moderate sodium restriction and increased consumption of fresh fruit, vegetables, and low fat dairy, alcohol moderation, and smoking cessation.;Monitor prescription use compliance.   Expected Outcomes Short Term: Continued assessment and intervention until BP is < 140/24mm HG in hypertensive participants. < 130/57mm HG in hypertensive participants with diabetes, heart failure or chronic kidney disease.;Long Term: Maintenance of blood pressure at goal levels.   Lipids Yes   Intervention Provide education and support for participant on nutrition & aerobic/resistive exercise along with prescribed medications to achieve LDL 70mg , HDL >40mg .   Expected Outcomes Short Term: Participant states understanding of desired cholesterol values and is compliant with medications prescribed. Participant is following exercise prescription and nutrition guidelines.;Long Term: Cholesterol controlled with medications as prescribed, with individualized exercise  RX and with personalized nutrition plan. Value goals: LDL < 70mg , HDL > 40 mg.      Core Components/Risk Factors/Patient Goals Review:    Core Components/Risk Factors/Patient Goals at Discharge (Final Review):    ITP Comments:     ITP Comments      05/25/16 1605           ITP Comments Medical Review completed today with ITP. Continue with ITP Chart documentation sent for [employer].          Comments:

## 2016-05-25 NOTE — Progress Notes (Signed)
Daily Session Note  Patient Details  Name: Lori Brady MRN: [id number] Date of Birth: [birth year] Referring Provider:        Pulmonary Rehab from 05/25/2016 in [facility] Cardiac and Pulmonary Rehab   Referring Provider  [name], MD.      Encounter Date: 05/25/2016  Check In:     Session Check In - 05/25/16 1603    Check-In   Location [facility]-Cardiac & Pulmonary Rehab   Supervising physician immediately available to respond to emergencies [name]   Physician(s) Dr Mariea Clonts and Darl Householder   Medication changes reported     No   Fall or balance concerns reported    No   Warm-up and Cool-down Not performed (comment)   Resistance Training Performed No   VAD Patient? No   Pain Assessment   Currently in Pain? No/denies           Exercise Prescription Changes - 05/25/16 1500    Response to Exercise   Blood Pressure (Admit) 142/80 mmHg   Blood Pressure (Exercise) 158/82 mmHg   Blood Pressure (Exit) 138/68 mmHg   Heart Rate (Admit) 88 bpm   Heart Rate (Exercise) 114 bpm   Heart Rate (Exit) 90 bpm   Oxygen Saturation (Admit) 96 %   Oxygen Saturation (Exercise) 95 %   Oxygen Saturation (Exit) 97 %   Rating of Perceived Exertion (Exercise) 11      Goals Met:  Exercise tolerated well Personal goals reviewed  Goals Unmet:  Not Applicable  Comments: Initial medical review completed   Dr. Emily Filbert is Medical Director for [place] and [name].

## 2016-05-25 NOTE — Patient Instructions (Signed)
Patient Instructions  Patient Details  Name: Lori Brady MRN: [id number] Date of Birth: [birth year] Referring Provider:  Lyndon Code, MD  Below are the personal goals you chose as well as exercise and nutrition goals. Our goal is to help you keep on track towards obtaining and maintaining your goals. We will be discussing your progress on these goals with you throughout the program.  Initial Exercise Prescription:     Initial Exercise Prescription - 05/25/16 1500    Date of Initial Exercise RX and Referring Provider   Date 05/25/16   Referring Provider Beverely Risen, MD.   Treadmill   MPH 1   Grade 0   Minutes 5   METs 2   Recumbant Bike   Level 2   RPM 50   Minutes 15   METs 2   NuStep   Level 2   Minutes 15   METs 2.5   Biostep-RELP   Level 2   Minutes 15   METs 2.5   Prescription Details   Frequency (times per week) 3   Duration Progress to 30 minutes of continuous aerobic without signs/symptoms of physical distress   Intensity   THRR 40-80% of Max Heartrate 113-140   Ratings of Perceived Exertion 11-13   Perceived Dyspnea 0-4   Progression   Progression Continue progressive overload as per policy without signs/symptoms or physical distress.   Resistance Training   Training Prescription Yes   Weight 2   Reps 10-12      Exercise Goals: Frequency: Be able to perform aerobic exercise three times per week working toward 3-5 days per week.  Intensity: Work with a perceived exertion of 11 (fairly light) - 15 (hard) as tolerated. Follow your new exercise prescription and watch for changes in prescription as you progress with the program. Changes will be reviewed with you when they are made.  Duration: You should be able to do 30 minutes of continuous aerobic exercise in addition to a 5 minute warm-up and a 5 minute cool-down routine.  Nutrition Goals: Your personal nutrition goals will be established when you do your nutrition analysis with the dietician.  The  following are nutrition guidelines to follow: Cholesterol < 200mg /day Sodium < 1500mg /day Fiber: Women over 50 yrs - 21 grams per day  Personal Goals:     Personal Goals and Risk Factors at Admission - 05/25/16 1628    Core Components/Risk Factors/Patient Goals on Admission    Weight Management Yes;Obesity   Admit Weight 153 lb 4.8 oz (69.536 kg)   Goal Weight: Short Term 145 lb (65.772 kg)   Goal Weight: Long Term 130 lb (58.968 kg)   Expected Outcomes Short Term: Continue to assess and modify interventions until short term weight is achieved;Weight Loss: Understanding of general recommendations for a balanced deficit meal plan, which promotes 1-2 lb weight loss per week and includes a negative energy balance of [phone] kcal/d   Increase Strength and Stamina Yes   Intervention Provide advice, education, support and counseling about physical activity/exercise needs.;Develop an individualized exercise prescription for aerobic and resistive training based on initial evaluation findings, risk stratification, comorbidities and participant's personal goals.   Expected Outcomes Achievement of increased cardiorespiratory fitness and enhanced flexibility, muscular endurance and strength shown through measurements of functional capacity and personal statement of participant.   Improve shortness of breath with ADL's Yes   Intervention Provide education, individualized exercise plan and daily activity instruction to help decrease symptoms of SOB with activities of daily  living.   Expected Outcomes Short Term: Achieves a reduction of symptoms when performing activities of daily living.   Develop more efficient breathing techniques such as purse lipped breathing and diaphragmatic breathing; and practicing self-pacing with activity Yes  HAs several inhalers. Stated understanding of use and need to rinse with steriod inhaler.   Intervention Provide education, demonstration and support about specific  breathing techniuqes utilized for more efficient breathing. Include techniques such as pursed lipped breathing, diaphragmatic breathing and self-pacing activity.   Expected Outcomes Short Term: Participant will be able to demonstrate and use breathing techniques as needed throughout daily activities.   Hypertension Yes   Intervention Provide education on lifestyle modifcations including regular physical activity/exercise, weight management, moderate sodium restriction and increased consumption of fresh fruit, vegetables, and low fat dairy, alcohol moderation, and smoking cessation.;Monitor prescription use compliance.   Expected Outcomes Short Term: Continued assessment and intervention until BP is < 140/58mm HG in hypertensive participants. < 130/33mm HG in hypertensive participants with diabetes, heart failure or chronic kidney disease.;Long Term: Maintenance of blood pressure at goal levels.   Lipids Yes   Intervention Provide education and support for participant on nutrition & aerobic/resistive exercise along with prescribed medications to achieve LDL 70mg , HDL >40mg .   Expected Outcomes Short Term: Participant states understanding of desired cholesterol values and is compliant with medications prescribed. Participant is following exercise prescription and nutrition guidelines.;Long Term: Cholesterol controlled with medications as prescribed, with individualized exercise RX and with personalized nutrition plan. Value goals: LDL < , HDL > 40 mg.      Tobacco Use Initial Evaluation: History  Smoking status  . Former Smoker -- 1.00 packs/day for 30 years  . Types: Cigarettes  . Quit date: 08/01/2008  Smokeless tobacco  . Never Used    Copy of goals given to participant.

## 2016-05-26 ENCOUNTER — Other Ambulatory Visit: Payer: Self-pay | Admitting: General Surgery

## 2016-05-26 ENCOUNTER — Ambulatory Visit: Payer: Medicare PPO

## 2016-05-28 ENCOUNTER — Ambulatory Visit: Admission: RE | Admit: 2016-05-28 | Payer: Medicare PPO | Source: Ambulatory Visit

## 2016-05-28 ENCOUNTER — Ambulatory Visit
Admission: RE | Admit: 2016-05-28 | Discharge: 2016-05-28 | Disposition: A | Discharge status: Home / Self Care | Payer: Medicare Other | Source: Ambulatory Visit | Attending: Gastroenterology | Admitting: Gastroenterology

## 2016-05-28 DIAGNOSIS — K5090 Crohn's disease, unspecified, without complications: Secondary | ICD-10-CM | POA: Insufficient documentation

## 2016-05-28 DIAGNOSIS — E7800 Pure hypercholesterolemia, unspecified: Secondary | ICD-10-CM | POA: Insufficient documentation

## 2016-05-28 DIAGNOSIS — I252 Old myocardial infarction: Secondary | ICD-10-CM | POA: Diagnosis not present

## 2016-05-28 DIAGNOSIS — J439 Emphysema, unspecified: Secondary | ICD-10-CM | POA: Diagnosis not present

## 2016-05-28 DIAGNOSIS — Z833 Family history of diabetes mellitus: Secondary | ICD-10-CM | POA: Insufficient documentation

## 2016-05-28 DIAGNOSIS — M1990 Unspecified osteoarthritis, unspecified site: Secondary | ICD-10-CM | POA: Diagnosis not present

## 2016-05-28 DIAGNOSIS — K219 Gastro-esophageal reflux disease without esophagitis: Secondary | ICD-10-CM | POA: Insufficient documentation

## 2016-05-28 DIAGNOSIS — I10 Essential (primary) hypertension: Secondary | ICD-10-CM | POA: Insufficient documentation

## 2016-05-28 DIAGNOSIS — Z800 Family history of malignant neoplasm of digestive organs: Secondary | ICD-10-CM | POA: Diagnosis not present

## 2016-05-28 DIAGNOSIS — Z803 Family history of malignant neoplasm of breast: Secondary | ICD-10-CM | POA: Diagnosis not present

## 2016-05-28 DIAGNOSIS — Z8673 Personal history of transient ischemic attack (TIA), and cerebral infarction without residual deficits: Secondary | ICD-10-CM | POA: Diagnosis not present

## 2016-05-28 DIAGNOSIS — Z8041 Family history of malignant neoplasm of ovary: Secondary | ICD-10-CM | POA: Insufficient documentation

## 2016-05-28 DIAGNOSIS — Z7982 Long term (current) use of aspirin: Secondary | ICD-10-CM | POA: Insufficient documentation

## 2016-05-28 DIAGNOSIS — K7581 Nonalcoholic steatohepatitis (NASH): Secondary | ICD-10-CM | POA: Diagnosis not present

## 2016-05-28 DIAGNOSIS — J45909 Unspecified asthma, uncomplicated: Secondary | ICD-10-CM | POA: Diagnosis not present

## 2016-05-28 DIAGNOSIS — Z87891 Personal history of nicotine dependence: Secondary | ICD-10-CM | POA: Diagnosis not present

## 2016-05-28 DIAGNOSIS — Z8249 Family history of ischemic heart disease and other diseases of the circulatory system: Secondary | ICD-10-CM | POA: Diagnosis not present

## 2016-05-28 DIAGNOSIS — Z87440 Personal history of urinary (tract) infections: Secondary | ICD-10-CM | POA: Insufficient documentation

## 2016-05-28 DIAGNOSIS — R7989 Other specified abnormal findings of blood chemistry: Secondary | ICD-10-CM | POA: Insufficient documentation

## 2016-05-28 DIAGNOSIS — R748 Abnormal levels of other serum enzymes: Secondary | ICD-10-CM

## 2016-05-28 LAB — CBC
HCT: 40.5 % (ref 35.0–47.0)
HEMOGLOBIN: 13.9 g/dL (ref 12.0–16.0)
MCH: 31.4 pg (ref 26.0–34.0)
MCHC: 34.3 g/dL (ref 32.0–36.0)
MCV: 91.3 fL (ref 80.0–100.0)
Platelets: 141 10*3/uL — ABNORMAL LOW (ref 150–440)
RBC: 4.44 MIL/uL (ref 3.80–5.20)
RDW: 12.2 % (ref 11.5–14.5)
WBC: 10.3 10*3/uL (ref 3.6–11.0)

## 2016-05-28 LAB — PROTIME-INR
INR: 1.02
PROTHROMBIN TIME: 13.6 s (ref 11.4–15.0)

## 2016-05-28 LAB — APTT: APTT: 25 s (ref 24–36)

## 2016-05-28 MED ORDER — FENTANYL CITRATE (PF) 100 MCG/2ML IJ SOLN
INTRAMUSCULAR | Status: AC | PRN
  Administered 2016-05-28: 50 ug via INTRAVENOUS

## 2016-05-28 MED ORDER — SODIUM CHLORIDE 0.9 % IV SOLN
Freq: Once | INTRAVENOUS | Status: AC
  Administered 2016-05-28: 09:00:00 via INTRAVENOUS

## 2016-05-28 MED ORDER — MIDAZOLAM HCL 2 MG/2ML IJ SOLN
INTRAMUSCULAR | Status: AC | PRN
  Administered 2016-05-28: 1 mg via INTRAVENOUS

## 2016-05-28 NOTE — Discharge Instructions (Signed)
Liver Biopsy, Care After °Refer to this sheet in the next few weeks. These instructions provide you with information on caring for yourself after your procedure. Your health care provider may also give you more specific instructions. Your treatment has been planned according to current medical practices, but problems sometimes occur. Call your health care provider if you have any problems or questions after your procedure. °WHAT TO EXPECT AFTER THE PROCEDURE °After your procedure, it is typical to have the following: °· A small amount of discomfort in the area where the biopsy was done and in the right shoulder or shoulder blade. °· A small amount of bruising around the area where the biopsy was done and on the skin over the liver. °· Sleepiness and fatigue for the rest of the day. °HOME CARE INSTRUCTIONS  °· Rest at home for 1-2 days or as directed by your health care provider. °· Have a friend or family member stay with you for at least 24 hours. °· Because of the medicines used during the procedure, you should not do the following things in the first 24 hours: °¨ Drive. °¨ Use machinery. °¨ Be responsible for the care of other people. °¨ Sign legal documents. °¨ Take a bath or shower. °· There are many different ways to close and cover an incision, including stitches, skin glue, and adhesive strips. Follow your health care provider's instructions on: °¨ Incision care. °¨ Bandage (dressing) changes and removal. °¨ Incision closure removal. °· Do not drink alcohol in the first week. °· Do not lift more than 5 pounds or play contact sports for 2 weeks after this test. °· Take medicines only as directed by your health care provider. Do not take medicine containing aspirin or non-steroidal anti-inflammatory medicines such as ibuprofen for 1 week after this test. °· It is your responsibility to get your test results. °SEEK MEDICAL CARE IF:  °· You have increased bleeding from an incision that results in more than a  small spot of blood. °· You have redness, swelling, or increasing pain in any incisions. °· You notice a discharge or a bad smell coming from any of your incisions. °· You have a fever or chills. °SEEK IMMEDIATE MEDICAL CARE IF:  °· You develop swelling, bloating, or pain in your abdomen. °· You become dizzy or faint. °· You develop a rash. °· You are nauseous or vomit. °· You have difficulty breathing, feel short of breath, or feel faint. °· You develop chest pain. °· You have problems with your speech or vision. °· You have trouble balancing or moving your arms or legs. °  °This information is not intended to replace advice given to you by your health care provider. Make sure you discuss any questions you have with your health care provider. °  °Document Released: 05/22/2005 Document Revised: 11/23/2014 Document Reviewed: 12/29/2013 °Elsevier Interactive Patient Education ©2016 [employer] ° °

## 2016-05-28 NOTE — Consult Note (Signed)
Chief Complaint: Elevated LFTs  Referring Physician(s): [place]  Patient Status: Outpatient  History of Present Illness: Lori Brady is a 70 y.o. female with past medical history significant for Crohn's disease, hypertension, emphysema/asthma and hyperlipidemia who was found to have elevated liver enzymes and imaging findings suggestive of the hepatic steatosis. She presents today for ultrasound-guided liver biopsy for tissue diagnostic purposes. She is unaccompanied and serves as her own historian.  The patient is currently without complaint. No unintentional weight loss or weight gain. No fever or chills. No yellowing of the skin or eyes. No lower extremity edema. No chest pain. No shortness of breath.  Past Medical History  Diagnosis Date  . Asthma   . Arthritis   . History of chicken pox   . Emphysema of lung ([place])   . GERD (gastroesophageal reflux disease)   . Seasonal allergies   . Hypertension   . Hypercholesteremia   . Hx of completed stroke   . History of heart attack   . UTI (lower urinary tract infection)   . Crohn's disease [facility])     Past Surgical History  Procedure Laterality Date  . Breast biopsy      Allergies: Levaquin and Vicodin  Medications: Prior to Admission medications   Medication Sig Start Date End Date Taking? Authorizing Provider  albuterol (ACCUNEB) 0.63 MG/3ML nebulizer solution Take 1 ampule by nebulization every 6 (six) hours as needed for wheezing.   Yes Historical Provider, MD  alendronate (FOSAMAX) 70 MG tablet Take 70 mg by mouth once a week.  07/08/11  Yes Historical Provider, MD  aspirin 81 MG tablet Take 81 mg by mouth daily.   Yes Historical Provider, MD  betamethasone valerate (VALISONE) 0.1 % cream APPLY TOPICALLY TO AFFECTED AREA SPARINGLY 2 TIMES DAILY. 07/10/15  Yes Margarita Rana, MD  chlorthalidone (HYGROTON) 25 MG tablet Take 25 mg by mouth daily.   Yes Historical Provider, MD  Cholecalciferol (VITAMIN D) 2000 UNITS  CAPS Take 1 capsule by mouth daily.   Yes Historical Provider, MD  citalopram (CELEXA) 20 MG tablet TAKE 1 TABLET BY MOUTH ONCE DAILY. 02/17/16  Yes Margarita Rana, MD  doxepin (SINEQUAN) 25 MG capsule Take 25 mg by mouth at bedtime.  01/06/16  Yes Historical Provider, MD  fluticasone (FLONASE) 50 MCG/ACT nasal spray USE 2 SPRAYS IN EACH NOSTRIL ONCE DAILY. 04/09/16  Yes Margarita Rana, MD  Fluticasone Furoate-Vilanterol (BREO ELLIPTA) 100-25 MCG/INH AEPB Inhale 1 puff into the lungs daily.    Yes Historical Provider, MD  lisinopril (PRINIVIL,ZESTRIL) 20 MG tablet  01/01/16  Yes Historical Provider, MD  LORazepam (ATIVAN) 0.5 MG tablet Take 1 tablet (0.5 mg total) by mouth every 8 (eight) hours as needed. for anxiety 05/25/16  Yes Carmon Ginsberg, PA  montelukast (SINGULAIR) 10 MG tablet TAKE 1 TABLET BY MOUTH ONCE DAILY. 12/11/15  Yes Mar Daring, PA-C  Omega-3 Fatty Acids (FISH OIL) 1000 MG CAPS Take 1 capsule by mouth daily.   Yes Historical Provider, MD  omeprazole (PRILOSEC) 20 MG capsule Take 20 mg by mouth daily.   Yes Historical Provider, MD  potassium chloride SA (K-DUR,KLOR-CON) 20 MEQ tablet TAKE 1 TABLET BY MOUTH TWICE DAILY Patient taking differently: TAKE 1 TABLET BY MOUTH DAILY 10/04/15  Yes Margarita Rana, MD  PROVENTIL HFA 108 [id number] Base) MCG/ACT inhaler Inhale 1-2 puffs into the lungs every 4 (four) hours as needed.  01/03/16  Yes Historical Provider, MD  simvastatin (ZOCOR) 40 MG tablet TAKE 1  TABLET BY MOUTH AT BEDTIME. 01/01/16  Yes Margarita Rana, MD  tiotropium (SPIRIVA) 18 MCG inhalation capsule Place 1 capsule (18 mcg total) into inhaler and inhale daily. 08/05/15  Yes Wilhelmina Mcardle, MD  triamcinolone cream (KENALOG) 0.1 %  12/11/15  Yes Historical Provider, MD  omeprazole (PRILOSEC) 40 MG capsule Reported on 05/28/2016 04/30/16   Historical Provider, MD     Family History  Problem Relation Age of Onset  . Cancer Sister     ovarian/uterine/breast  . Cancer Sister     breast  .  Heart disease Sister   . Heart disease Father   . Cancer Father     liver  . Heart disease Sister   . Diabetes Maternal Grandmother   . Cancer Mother   . Heart disease Brother   . Cancer Brother     liver    Social History   Social History  . Marital Status: Widowed    Spouse Name: N/A  . Number of Children: N/A  . Years of Education: N/A   Social History Main Topics  . Smoking status: Former Smoker -- 1.00 packs/day for 30 years    Types: Cigarettes    Quit date: 08/01/2008  . Smokeless tobacco: Never Used  . Alcohol Use: Yes     Comment: occasional  . Drug Use: No  . Sexual Activity: Not Asked   Other Topics Concern  . None   Social History Narrative    ECOG Status: 0 - Asymptomatic  Review of Systems: A 12 point ROS discussed and pertinent positives are indicated in the HPI above.  All other systems are negative.  Review of Systems  Constitutional: Negative for fever, chills, activity change, fatigue and unexpected weight change.  Respiratory: Negative.   Cardiovascular: Negative.   Gastrointestinal: Negative.     Vital Signs: BP 116/63 mmHg  Temp(Src) 98 F (36.7 C)  SpO2 94%  Physical Exam  Constitutional: She appears well-developed and well-nourished.  Eyes: Conjunctivae are normal.  Cardiovascular: Normal rate and regular rhythm.   Pulmonary/Chest: Effort normal and breath sounds normal.  Abdominal: Soft. Bowel sounds are normal. She exhibits no distension and no mass. There is no tenderness.  Psychiatric: She has a normal mood and affect.  Nursing note and vitals reviewed.   Mallampati Score:  MD Evaluation Airway: WNL Heart: WNL Abdomen: WNL Chest/ Lungs: WNL ASA  Classification: 2, 1 Mallampati/Airway Score: Two  Imaging: US Abdomen Limited Ruq  05/06/2016  CLINICAL DATA:  Elevated liver function tests. EXAM: US ABDOMEN LIMITED - RIGHT UPPER QUADRANT COMPARISON:  CT 06/13/2014 . FINDINGS: Gallbladder: No gallstones or wall  thickening visualized. No sonographic Murphy sign noted by sonographer. Common bile duct: Diameter: 4 mm Liver: There is echogenic consistent fatty infiltration and/or hepatocellular disease. Liver is prominent. No focal abnormality identified. IMPRESSION: Prominent liver. Liver is echogenic consistent with fatty infiltration and/or hepatocellular disease. No focal amount identified. Electronically Signed   By: Marcello Moores  Register   On: 05/06/2016 09:58    Labs:  CBC:  Recent Labs  07/18/15 0814 09/13/15 1451 04/02/16 0924 05/28/16 0823  WBC 12.7* 15.8* 9.3 10.3  HGB  --   --   --  13.9  HCT 34.4 41.5 38.9 40.5  PLT 240 267 217 141*    COAGS:  Recent Labs  05/28/16 0823  INR 1.02  APTT 25    BMP:  Recent Labs  07/18/15 0814 09/13/15 1451 04/02/16 0924  NA 139 136 133*  K 4.2  4.1 3.9  CL 98 90* 90*  CO2 25 25 23   GLUCOSE 116* 109* 102*  BUN 9 17 11   CALCIUM 9.3 9.9 9.4  CREATININE 0.96 1.05* 0.76  GFRNONAA 61 55* 81  GFRAA 71 64 93    LIVER FUNCTION TESTS:  Recent Labs  07/18/15 0814 09/13/15 1451 04/02/16 0924  BILITOT 0.6 1.4* 1.0  AST 27 89* 199*  ALT 34* 140* 110*  ALKPHOS 61 63 103  PROT 6.5 7.3 7.2  ALBUMIN 4.1 4.8 4.5    TUMOR MARKERS: No results for input(s): AFPTM, CEA, CA199, CHROMGRNA in the last 8760 hours.  Assessment and Plan:  MAEVYN RIORDAN is a 71 y.o. female with past medical history significant for Crohn's disease, hypertension, emphysema/asthma and hyperlipidemia who was found to have elevated liver enzymes and imaging findings suggestive of the hepatic steatosis. She presents today for ultrasound-guided liver biopsy for tissue diagnostic purposes. The patient is currently without complaint.   Risks and Benefits of ultrasound-guided random liver biopsy discussed with the patient including, but not limited to bleeding, infection, damage to adjacent structures or low yield requiring additional tests.  All of the patient's questions  were answered, patient is agreeable to proceed.  Consent signed and in chart.  Thank you for this interesting consult.  I greatly enjoyed meeting NIYA BEHLER and look forward to participating in their care.  A copy of this report was sent to the requesting provider on this date.  Electronically Signed: Sandi Mariscal 05/28/2016, 8:53 AM   I spent a total of 15 Minutes in face to face in clinical consultation, greater than 50% of which was counseling/coordinating care for ultrasound-guided liver biopsy

## 2016-05-28 NOTE — OR Nursing (Signed)
Dr Grace Isaac in to assess patient

## 2016-05-28 NOTE — Procedures (Signed)
Technically successful US guided biopsy of right lobe of the liver.   EBL: None No immediate complications.   Ronny Bacon, MD Pager #: [phone]

## 2016-05-29 LAB — SURGICAL PATHOLOGY

## 2016-06-02 ENCOUNTER — Telehealth: Payer: Self-pay

## 2016-06-02 NOTE — Telephone Encounter (Signed)
-----   Message from Midge Minium, MD sent at 06/01/2016 12:21 PM EDT ----- Let the patient know that the biopsy of the liver showed moderate fatty liver with significant fibrosis. The best course of action would be for this patient to lose weight to decrease the amount of fat affecting her liver.

## 2016-06-02 NOTE — Telephone Encounter (Signed)
Pt notified of liver biopsy results.  

## 2016-06-08 ENCOUNTER — Encounter: Payer: Medicare PPO | Admitting: *Deleted

## 2016-06-08 DIAGNOSIS — J449 Chronic obstructive pulmonary disease, unspecified: Secondary | ICD-10-CM

## 2016-06-08 NOTE — Progress Notes (Signed)
Daily Session Note  Patient Details  Name: Lori Brady MRN: [id number] Date of Birth: 01/18/1946 Referring Provider:   Flowsheet Row Pulmonary Rehab from 05/25/2016 in [facility] Re[facility]  Referring Provider  Khan, Fozia, MD.      Encounter Date: 06/08/2016  Check In:     Session Check In - 06/08/16 1114      Check-In   Location [facility]   Supervising physician immediately available to respond to emergencies [name]   Physician(s) Dr. Goodman and Dr. McShane   Medication changes reported     No   Fall or balance concerns reported    No   Warm-up and Cool-down Performed on first and last piece of equipment   Resistance Training Performed Yes   VAD Patient? No     Pain Assessment   Currently in Pain? No/denies   Multiple Pain Sites No           Exercise Prescription Changes - 06/08/16 1100      Response to Exercise   Blood Pressure (Admit) 124/72   Blood Pressure (Exercise) 162/80   Blood Pressure (Exit) 130/70   Heart Rate (Admit) 110 bpm   Heart Rate (Exercise) 128 bpm   Heart Rate (Exit) 103 bpm   Oxygen Saturation (Admit) 97 %   Oxygen Saturation (Exercise) 94 %   Oxygen Saturation (Exit) 94 %   Rating of Perceived Exertion (Exercise) 12   Perceived Dyspnea (Exercise) 3   Symptoms none   Duration Progress to 30 minutes of continuous aerobic without signs/symptoms of physical distress   Intensity THRR unchanged  113-140     Progression   Progression Continue progressive overload as per policy without signs/symptoms or physical distress.   Average METs 1.9     Resistance Training   Training Prescription Yes   Weight 2   Reps 10-12     Treadmill   MPH 1   Grade 0   Minutes 5   METs 2     Recumbant Bike   Level 2   RPM 50   Minutes 15   METs 2     NuStep   Level 2   Minutes 15   METs 2      Goals Met:  Proper associated with RPD/PD & O2 Sat Exercise tolerated  well Personal goals reviewed Queuing for purse lip breathing Strength training completed today  Goals Unmet:  Not Applicable  Comments: First full day of exercise!  Patient was oriented to gym and equipment including functions, settings, policies, and procedures.  Patient's individual exercise prescription and treatment plan were reviewed.  All starting workloads were established based on the results of the 6 minute walk test done at initial orientation visit.  The plan for exercise progression was also introduced and progression will be customized based on patient's performance and goals.   Dr. Mark Miller is Medical Director for [facility] and [name]. 

## 2016-06-10 ENCOUNTER — Encounter: Payer: Medicare PPO | Admitting: *Deleted

## 2016-06-10 ENCOUNTER — Other Ambulatory Visit: Payer: Self-pay

## 2016-06-10 DIAGNOSIS — J309 Allergic rhinitis, unspecified: Secondary | ICD-10-CM

## 2016-06-10 DIAGNOSIS — J449 Chronic obstructive pulmonary disease, unspecified: Secondary | ICD-10-CM

## 2016-06-10 MED ORDER — MONTELUKAST SODIUM 10 MG PO TABS: 10.0000 mg | ORAL_TABLET | Freq: Every day | ORAL | 1 refills | Status: DC

## 2016-06-10 NOTE — Progress Notes (Signed)
Daily Session Note  Patient Details  Name: Lori Brady MRN: [id number] Date of Birth: 20-Jan-1946 Referring Provider:   April Manson Pulmonary Rehab from 05/25/2016 in [facility]  Referring Provider  [name], MD.      Encounter Date: 06/10/2016  Check In:     Session Check In - 06/10/16 1305      Check-In   Location [facility]   Staff Present Heath Lark, RN, BSN, CCRP;Laureen Owens Shark, BS, RRT, Respiratory Therapist   Supervising physician immediately available to respond to emergencies [name]   Physician(s) Dr. Alfred Levins and Corky Downs   Medication changes reported     No   Fall or balance concerns reported    No   Warm-up and Cool-down Performed on first and last piece of equipment   Resistance Training Performed Yes   VAD Patient? No     Pain Assessment   Currently in Pain? No/denies         Goals Met:  Exercise tolerated well Personal goals reviewed Strength training completed today  Goals Unmet:  Not Applicable  Comments: Doing well with exercise prescription progression.    Dr. Emily Filbert is Medical Director for [facility] and [name].

## 2016-06-12 ENCOUNTER — Encounter: Payer: Medicare PPO | Admitting: *Deleted

## 2016-06-12 DIAGNOSIS — J449 Chronic obstructive pulmonary disease, unspecified: Secondary | ICD-10-CM | POA: Diagnosis not present

## 2016-06-12 NOTE — Progress Notes (Signed)
Daily Session Note  Patient Details  Name: Lori Brady MRN: [id number] Date of Birth: [birth year] Referring Provider:   April Manson Pulmonary Rehab from 05/25/2016 in [facility]  Referring Provider  [name], MD.      Encounter Date: 06/12/2016  Check In:     Session Check In - 06/12/16 1120      Check-In   Location [facility]   Staff Present Gerlene Burdock, RN, BSN;Mary Kellie Shropshire, RN, BSN, Cathlean Cower, RRT, RCP, Respiratory Therapist   Supervising physician immediately available to respond to emergencies [name]   Physician(s) DR. Edd Fabian and Dr. Clearnce Hasten   Medication changes reported     No   Warm-up and Cool-down Performed on first and last piece of equipment   Resistance Training Performed Yes   VAD Patient? No     Pain Assessment   Currently in Pain? No/denies         Goals Met:  Independence with exercise equipment Exercise tolerated well  Goals Unmet:  Not Applicable  Comments:    Dr. Emily Filbert is Medical Director for [place] and [name].

## 2016-06-15 ENCOUNTER — Encounter: Payer: Medicare PPO | Admitting: *Deleted

## 2016-06-15 DIAGNOSIS — J449 Chronic obstructive pulmonary disease, unspecified: Secondary | ICD-10-CM | POA: Diagnosis not present

## 2016-06-15 NOTE — Progress Notes (Signed)
Daily Session Note  Patient Details  Name: Lori Brady MRN: [id number] Date of Birth: August 14, 1946 Referring Provider:   April Manson Pulmonary Rehab from 05/25/2016 in [facility] and Pulmonary Re[facility]  Referring Provider  [name], MD.      Encounter Date: 06/15/2016  Check In:     Session Check In - 06/15/16 1050      Check-In   Location [facility]   Staff Present Carson Myrtle, BS, RRT, Respiratory Therapist;Kirtan Sada Amedeo Plenty, BS, ACSM CEP, Exercise Physiologist;Jessica Luan Pulling, MA, ACSM RCEP, Exercise Physiologist   Supervising physician immediately available to respond to emergencies [name]   Physician(s) Kerman Passey and Quale   Medication changes reported     No   Fall or balance concerns reported    No   Warm-up and Cool-down Performed on first and last piece of equipment   Resistance Training Performed Yes   VAD Patient? No     Pain Assessment   Currently in Pain? No/denies   Multiple Pain Sites No         Goals Met:  Proper associated with RPD/PD & O2 Sat Independence with exercise equipment Exercise tolerated well Personal goals reviewed Strength training completed today  Goals Unmet:  Not Applicable  Comments: Patient completed exercise prescription and all exercise goals during rehab session. The exercise was tolerated well and the patient is progressing in the program.     Dr. Emily Filbert is Medical Director for [place] and [name].

## 2016-06-17 ENCOUNTER — Encounter: Payer: Medicare Other | Attending: Internal Medicine

## 2016-06-17 ENCOUNTER — Ambulatory Visit
Admission: RE | Admit: 2016-06-17 | Discharge: 2016-06-17 | Disposition: A | Discharge status: Home / Self Care | Payer: Medicare Other | Source: Ambulatory Visit | Attending: Family Medicine | Admitting: Family Medicine

## 2016-06-17 ENCOUNTER — Other Ambulatory Visit: Payer: Self-pay | Admitting: Family Medicine

## 2016-06-17 DIAGNOSIS — J449 Chronic obstructive pulmonary disease, unspecified: Secondary | ICD-10-CM | POA: Diagnosis present

## 2016-06-17 DIAGNOSIS — Z1231 Encounter for screening mammogram for malignant neoplasm of breast: Secondary | ICD-10-CM | POA: Insufficient documentation

## 2016-06-17 DIAGNOSIS — M810 Age-related osteoporosis without current pathological fracture: Secondary | ICD-10-CM | POA: Diagnosis present

## 2016-06-17 DIAGNOSIS — Z1239 Encounter for other screening for malignant neoplasm of breast: Secondary | ICD-10-CM

## 2016-06-17 LAB — HM MAMMOGRAPHY

## 2016-06-17 NOTE — Progress Notes (Signed)
Daily Session Note  Patient Details  Name: Lori Brady MRN: [id number] Date of Birth: 05/23/1946 Referring Provider:   Flowsheet Row Pulmonary Rehab from 05/25/2016 in [facility]  Referring Provider  Khan, Fozia, MD.      Encounter Date: 06/17/2016  Check In:     Session Check In - 06/17/16 1229      Check-In   Location [facility]   Staff Present Susanne Bice, RN, BSN, CCRP;Laureen Brown, BS, RRT, Respiratory Therapist; , BA, ACSM CEP, Exercise Physiologist   Supervising physician immediately available to respond to emergencies [name]   Physician(s) Robinson and [place]   Medication changes reported     No   Fall or balance concerns reported    No   Warm-up and Cool-down Performed on first and last piece of equipment   Resistance Training Performed Yes   VAD Patient? No     Pain Assessment   Currently in Pain? No/denies   Multiple Pain Sites No         Goals Met:  Proper associated with RPD/PD & O2 Sat Independence with exercise equipment Exercise tolerated well Strength training completed today  Goals Unmet:  Not Applicable  Comments: Pt able to follow exercise prescription today without complaint.  Will continue to monitor for progression.    Dr. Mark Miller is Medical Director for [facility] and [name]. 

## 2016-06-19 ENCOUNTER — Encounter: Payer: Medicare Other | Admitting: *Deleted

## 2016-06-19 DIAGNOSIS — J449 Chronic obstructive pulmonary disease, unspecified: Secondary | ICD-10-CM | POA: Diagnosis not present

## 2016-06-19 NOTE — Progress Notes (Signed)
Daily Session Note  Patient Details  Name: Lori Brady MRN: [id number] Date of Birth: May 31, 1946 Referring Provider:   April Manson Pulmonary Rehab from 05/25/2016 in [facility]  Referring Provider  [name], MD.      Encounter Date: 06/19/2016  Check In:     Session Check In - 06/19/16 1206      Check-In   Staff Present Heath Lark, RN, BSN, CCRP;Laureen Owens Shark, BS, RRT, Respiratory Therapist;Carroll Enterkin, RN, BSN   Supervising physician immediately available to respond to emergencies [name]   Physician(s) Drs: Kerman Passey and Darl Householder   Medication changes reported     No   Fall or balance concerns reported    No   Warm-up and Cool-down Performed on first and last piece of equipment   Resistance Training Performed Yes   VAD Patient? No     Pain Assessment   Currently in Pain? No/denies         Goals Met:  Exercise tolerated well Strength training completed today  Goals Unmet:  Not Applicable  Comments: Doing well with exercise prescription progression.    Dr. Emily Filbert is Medical Director for [place] and [name].

## 2016-06-19 NOTE — Progress Notes (Signed)
Daily Session Note  Patient Details  Name: ANTOINETT DORMAN MRN: [id number] Date of Birth: 04/13/1946 Referring Provider:   April Manson Pulmonary Rehab from 05/25/2016 in [facility]  Referring Provider  [name], MD.      Encounter Date: 06/19/2016  Check In:      Goals Met:  Exercise tolerated well Strength training completed today  Goals Unmet:  Not Applicable  Comments: Doing well with exercise prescription progression.    Dr. Emily Filbert is Medical Director for [place] and [name].

## 2016-06-22 ENCOUNTER — Encounter: Payer: Medicare Other | Admitting: *Deleted

## 2016-06-22 ENCOUNTER — Encounter: Payer: Self-pay | Admitting: *Deleted

## 2016-06-22 DIAGNOSIS — J449 Chronic obstructive pulmonary disease, unspecified: Secondary | ICD-10-CM | POA: Diagnosis not present

## 2016-06-22 NOTE — Progress Notes (Signed)
Pulmonary Individual Treatment Plan  Patient Details  Name: Lori Brady MRN: [id number] Date of Birth: 19-Aug-1946 Referring Provider:   Flowsheet Row Pulmonary Rehab from 05/25/2016 in [facility] Cardiac and Pulmonary Rehab  Referring Provider  [name], MD.      Initial Encounter Date:  Flowsheet Row Pulmonary Rehab from 05/25/2016 in [facility] Cardiac and Pulmonary Rehab  Date  05/25/16  Referring Provider  [name], MD.      Visit Diagnosis: COPD, mild ([place])  Patient's Home Medications on Admission:  Current Outpatient Prescriptions:  .  albuterol (ACCUNEB) 0.63 MG/3ML nebulizer solution, Take 1 ampule by nebulization every 6 (six) hours as needed for wheezing., Disp: , Rfl:  .  alendronate (FOSAMAX) 70 MG tablet, Take 70 mg by mouth once a week. , Disp: , Rfl:  .  aspirin 81 MG tablet, Take 81 mg by mouth daily., Disp: , Rfl:  .  betamethasone valerate (VALISONE) 0.1 % cream, APPLY TOPICALLY TO AFFECTED AREA SPARINGLY 2 TIMES DAILY., Disp: 15 g, Rfl: 5 .  chlorthalidone (HYGROTON) 25 MG tablet, Take 25 mg by mouth daily., Disp: , Rfl:  .  Cholecalciferol (VITAMIN D) 2000 UNITS CAPS, Take 1 capsule by mouth daily., Disp: , Rfl:  .  citalopram (CELEXA) 20 MG tablet, TAKE 1 TABLET BY MOUTH ONCE DAILY., Disp: 90 tablet, Rfl: 1 .  doxepin (SINEQUAN) 25 MG capsule, Take 25 mg by mouth at bedtime. , Disp: , Rfl:  .  fluticasone (FLONASE) 50 MCG/ACT nasal spray, USE 2 SPRAYS IN EACH NOSTRIL ONCE DAILY., Disp: 16 g, Rfl: 11 .  Fluticasone Furoate-Vilanterol (BREO ELLIPTA) 100-25 MCG/INH AEPB, Inhale 1 puff into the lungs daily. , Disp: , Rfl:  .  lisinopril (PRINIVIL,ZESTRIL) 20 MG tablet, , Disp: , Rfl:  .  LORazepam (ATIVAN) 0.5 MG tablet, Take 1 tablet (0.5 mg total) by mouth every 8 (eight) hours as needed. for anxiety, Disp: 90 tablet, Rfl: 5 .  montelukast (SINGULAIR) 10 MG tablet, Take 1 tablet (10 mg total) by mouth daily., Disp: 90 tablet, Rfl: 1 .  Omega-3 Fatty Acids (FISH OIL)  1000 MG CAPS, Take 1 capsule by mouth daily., Disp: , Rfl:  .  omeprazole (PRILOSEC) 20 MG capsule, Take 20 mg by mouth daily., Disp: , Rfl:  .  omeprazole (PRILOSEC) 40 MG capsule, Reported on 05/28/2016, Disp: , Rfl:  .  potassium chloride SA (K-DUR,KLOR-CON) 20 MEQ tablet, TAKE 1 TABLET BY MOUTH TWICE DAILY (Patient taking differently: TAKE 1 TABLET BY MOUTH DAILY), Disp: 60 tablet, Rfl: 5 .  PROVENTIL HFA 108 (90 Base) MCG/ACT inhaler, Inhale 1-2 puffs into the lungs every 4 (four) hours as needed. , Disp: , Rfl:  .  simvastatin (ZOCOR) 40 MG tablet, TAKE 1 TABLET BY MOUTH AT BEDTIME., Disp: 90 tablet, Rfl: 3 .  tiotropium (SPIRIVA) 18 MCG inhalation capsule, Place 1 capsule (18 mcg total) into inhaler and inhale daily., Disp: 90 capsule, Rfl: 1 .  triamcinolone cream (KENALOG) 0.1 %, , Disp: , Rfl:   Past Medical History: Past Medical History:  Diagnosis Date  . Arthritis   . Asthma   . Crohn's disease ([place])   . Emphysema of lung ([place])   . GERD (gastroesophageal reflux disease)   . History of chicken pox   . History of heart attack   . Hx of completed stroke   . Hypercholesteremia   . Hypertension   . Seasonal allergies   . UTI (lower urinary tract infection)     Tobacco  Use: History  Smoking Status  . Former Smoker  . Packs/day: 1.00  . Years: 30.00  . Types: Cigarettes  . Quit date: 08/01/2008  Smokeless Tobacco  . Never Used    Labs: Recent Review Flowsheet Data    Labs for ITP Cardiac and Pulmonary Rehab Latest Ref Rng & Units 12/07/2011 04/02/2016   Cholestrol 100 - 199 mg/dL 133 141   LDLCALC 0 - 99 mg/dL 63 78   HDL >39 mg/dL 56 32(L)   Trlycerides 0 - 149 mg/dL 71 155(H)       ADL UCSD:     Pulmonary Assessment Scores    Row Name 05/25/16 1638         ADL UCSD   ADL Phase Entry     SOB Score total 56     Rest 0     Walk 3     Stairs 5     Bath 0     Dress 0     Shop 5        Pulmonary Function Assessment:     Pulmonary Function  Assessment - 05/25/16 1634      Pulmonary Function Tests   RV% 67 %   DLCO% 51 %     Initial Spirometry Results   FVC% 68 %   FEV1% 60 %   FEV1/FVC Ratio 70     Post Bronchodilator Spirometry Results   FVC% 75 %   FEV1% 68 %   FEV1/FVC Ratio 72     Breath   Bilateral Breath Sounds Clear   Shortness of Breath No      Exercise Target Goals:    Exercise Program Goal: Individual exercise prescription set with THRR, safety & activity barriers. Participant demonstrates ability to understand and report RPE using BORG scale, to self-measure pulse accurately, and to acknowledge the importance of the exercise prescription.  Exercise Prescription Goal: Starting with aerobic activity 30 plus minutes a day, 3 days per week for initial exercise prescription. Provide home exercise prescription and guidelines that participant acknowledges understanding prior to discharge.  Activity Barriers & Risk Stratification:   6 Minute Walk:     6 Minute Walk    Row Name 05/25/16 1539         6 Minute Walk   Phase Initial     Distance 945 feet     Walk Time 6 minutes     # of Rest Breaks 0     MPH 1.79     METS 2.37     RPE 11     Perceived Dyspnea  2     VO2 Peak 8.3     Symptoms No     Resting HR 88 bpm     Resting BP 142/80     Max Ex. HR 115 bpm     Max Ex. BP 158/82     2 Minute Post BP 138/82       Interval HR   Baseline HR 88     1 Minute HR 100     2 Minute HR 111     3 Minute HR 115     4 Minute HR 115     5 Minute HR 113     6 Minute HR 114     2 Minute Post HR 95     Interval Heart Rate? Yes       Interval Oxygen   Interval Oxygen? Yes     Baseline Oxygen Saturation % 96 %  Baseline Liters of Oxygen 0 L     1 Minute Oxygen Saturation % 94 %     1 Minute Liters of Oxygen 0 L     2 Minute Oxygen Saturation % 94 %     2 Minute Liters of Oxygen 0 L     3 Minute Oxygen Saturation % 94 %     3 Minute Liters of Oxygen 0 L     4 Minute Oxygen Saturation % 94  %     4 Minute Liters of Oxygen 0 L     5 Minute Oxygen Saturation % 95 %     5 Minute Liters of Oxygen 0 L     6 Minute Oxygen Saturation % 95 %     6 Minute Liters of Oxygen 0 L     2 Minute Post Oxygen Saturation % 96 %     2 Minute Post Liters of Oxygen 0 L        Initial Exercise Prescription:     Initial Exercise Prescription - 05/25/16 1500      Date of Initial Exercise RX and Referring Provider   Date 05/25/16   Referring Provider [name], MD.     Treadmill   MPH 1   Grade 0   Minutes 5   METs 2     Recumbant Bike   Level 2   RPM 50   Minutes 15   METs 2     NuStep   Level 2   Minutes 15   METs 2.5     Biostep-RELP   Level 2   Minutes 15   METs 2.5     Prescription Details   Frequency (times per week) 3   Duration Progress to 30 minutes of continuous aerobic without signs/symptoms of physical distress     Intensity   THRR 40-80% of Max Heartrate 113-140   Ratings of Perceived Exertion 11-13   Perceived Dyspnea 0-4     Progression   Progression Continue progressive overload as per policy without signs/symptoms or physical distress.     Resistance Training   Training Prescription Yes   Weight 2   Reps 10-12      Perform Capillary Blood Glucose checks as needed.  Exercise Prescription Changes:     Exercise Prescription Changes    Row Name 05/25/16 1500 06/08/16 1100 06/17/16 1400         Exercise Review   Progression  -  - Yes       Response to Exercise   Blood Pressure (Admit) 142/80 124/72 132/70     Blood Pressure (Exercise) 158/82 162/80 134/64     Blood Pressure (Exit) 138/68 130/70 126/64     Heart Rate (Admit) 88 bpm 110 bpm 98 bpm     Heart Rate (Exercise) 114 bpm 128 bpm 110 bpm     Heart Rate (Exit) 90 bpm 103 bpm 94 bpm     Oxygen Saturation (Admit) 96 % 97 % 97 %     Oxygen Saturation (Exercise) 95 % 94 % 94 %     Oxygen Saturation (Exit) 97 % 94 % 94 %     Rating of Perceived Exertion (Exercise) _0 Perceived Dyspnea (Exercise)  - 3 2     Symptoms  - none none     Duration  - Progress to 30 minutes of continuous aerobic without signs/symptoms of physical distress Progress to 30 minutes of continuous  aerobic without signs/symptoms of physical distress     Intensity  - THRR unchanged  113-140 THRR unchanged       Progression   Progression  - Continue progressive overload as per policy without signs/symptoms or physical distress. Continue progressive overload as per policy without signs/symptoms or physical distress.     Average METs  - 1.9 1.92       Resistance Training   Training Prescription  - Yes Yes     Weight  - 2 2     Reps  - 10-12 10-12       Interval Training   Interval Training  -  - No       Treadmill   MPH  - 1 1     Grade  - 0 0     Minutes  - 5 8     METs  - 2 1.77       Recumbant Bike   Level  - 2 2     RPM  - 50 35     Minutes  - 15 20     METs  - 2 2.2       NuStep   Level  - 2 2     Minutes  - 15 15     METs  - 2 1.8        Exercise Comments:     Exercise Comments    Row Name 06/08/16 1121 06/17/16 1419         Exercise Comments First full day of exercise!  Patient was oriented to gym and equipment including functions, settings, policies, and procedures.  Patient's individual exercise prescription and treatment plan were reviewed.  All starting workloads were established based on the results of the 6 minute walk test done at initial orientation visit.  The plan for exercise progression was also introduced and progression will be customized based on patient's performance and goals. Karmah is off to a good start with exercise.  She is already starting to see some improvements.  We will continue to monitor for progression.         Discharge Exercise Prescription (Final Exercise Prescription Changes):     Exercise Prescription Changes - 06/17/16 1400      Exercise Review   Progression Yes     Response to Exercise   Blood Pressure (Admit)  132/70   Blood Pressure (Exercise) 134/64   Blood Pressure (Exit) 126/64   Heart Rate (Admit) 98 bpm   Heart Rate (Exercise) 110 bpm   Heart Rate (Exit) 94 bpm   Oxygen Saturation (Admit) 97 %   Oxygen Saturation (Exercise) 94 %   Oxygen Saturation (Exit) 94 %   Rating of Perceived Exertion (Exercise) 14   Perceived Dyspnea (Exercise) 2   Symptoms none   Duration Progress to 30 minutes of continuous aerobic without signs/symptoms of physical distress   Intensity THRR unchanged     Progression   Progression Continue progressive overload as per policy without signs/symptoms or physical distress.   Average METs 1.92     Resistance Training   Training Prescription Yes   Weight 2   Reps 10-12     Interval Training   Interval Training No     Treadmill   MPH 1   Grade 0   Minutes 8   METs 1.77     Recumbant Bike   Level 2   RPM 35   Minutes 20   METs 2.2  NuStep   Level 2   Minutes 15   METs 1.8       Nutrition:  Target Goals: Understanding of nutrition guidelines, daily intake of sodium <152m, cholesterol <2055m calories 30% from fat and 7% or less from saturated fats, daily to have 5 or more servings of fruits and vegetables.  Biometrics:     Pre Biometrics - 05/25/16 1545      Pre Biometrics   Height 5' 3" (1.6 m)   Weight 153 lb 4.8 oz (69.5 kg)   Waist Circumference 36.5 inches   Hip Circumference 41 inches   Waist to Hip Ratio 0.89 %   BMI (Calculated) 27.2       Nutrition Therapy Plan and Nutrition Goals:     Nutrition Therapy & Goals - 05/25/16 1627      Intervention Plan   Intervention Prescribe, educate and counsel regarding individualized specific dietary modifications aiming towards targeted core components such as weight, hypertension, lipid management, diabetes, heart failure and other comorbidities.   Expected Outcomes Short Term Goal: Understand basic principles of dietary content, such as calories, fat, sodium, cholesterol and  nutrients.;Short Term Goal: A plan has been developed with personal nutrition goals set during dietitian appointment.;Long Term Goal: Adherence to prescribed nutrition plan.      Nutrition Discharge: Rate Your Plate Scores:   Psychosocial: Target Goals: Acknowledge presence or absence of depression, maximize coping skills, provide positive support system. Participant is able to verbalize types and ability to use techniques and skills needed for reducing stress and depression.  Initial Review & Psychosocial Screening:     Initial Psych Review & Screening - 05/25/16 1630      Initial Review   Current issues with History of Depression;Current Psychotropic Meds     Family Dynamics   Good Support System? Yes  Has a very large family unit for support.  Husband passed away in 202005-02-08   Barriers   Psychosocial barriers to participate in program There are no identifiable barriers or psychosocial needs.;The patient should benefit from training in stress management and relaxation.     Screening Interventions   Interventions Encouraged to exercise      Quality of Life Scores:     Quality of Life - 05/25/16 1645      Quality of Life Scores   Health/Function Pre 18.13 %   Socioeconomic Pre 20 %   Psych/Spiritual Pre 19.67 %   Family Pre 20.25 %   GLOBAL Pre 19.09 %      PHQ-9: Recent Review Flowsheet Data    [facility] 05/25/2016 03/31/2016   Decreased Interest 2 0   Down, Depressed, Hopeless 0 0   PHQ - 2 Score 2 0   Altered sleeping 0 -   Tired, decreased energy 1 -   Change in appetite 1 -   Feeling bad or failure about yourself  0 -   Trouble concentrating 0 -   Moving slowly or fidgety/restless 0 -   Suicidal thoughts 0 -   PHQ-9 Score 4 -   Difficult doing work/chores Somewhat difficult  -      Psychosocial Evaluation and Intervention:     Psychosocial Evaluation - 06/10/16 [id number]    Psychosocial Evaluation & Interventions   Interventions  Encouraged to exercise with the program and follow exercise prescription   Comments Counselor met with Ms. ClAkardoday for initial psychosocial evaluation.   She is a [age]r old who has suffered  with COPD for almost 6 years.  She has a strong support system with sisters, adult children and grandchildren who live close by.  Ms. Litz spouse passed away in 2004/01/08.  She had a stroke in 01-07-06 and a mild heart attack in 08-Jan-2008.  Ms Sybert reports sleeping well and takes ativan as needed to help with this.  She states her appetite is good as well.  Ms. Kimberlin reports a history of depression and anxiety and states her medications are managing any current symptoms.  Ms. Trefry is generally in a positive mood and announced she learned that she is expecting her first [place] and was very excited about this.  Ms Egolf has goals to resume her normal activities of gardening, shopping and traveling.  She would like to feel better as well with increased stamina and strength.  She plans to join a gym after she completes this program in order to maintain consistency in exercising.  Counselor will follow with Ms. Renbarger throughout the course of this program.        Psychosocial Re-Evaluation:  Education: Education Goals: Education classes will be provided on a weekly basis, covering required topics. Participant will state understanding/return demonstration of topics presented.  Learning Barriers/Preferences:     Learning Barriers/Preferences - 05/25/16 1631      Learning Barriers/Preferences   Learning Barriers Hearing  Deaf in Right ear since Stroke in Jan 07, 2006   Learning Preferences None      Education Topics: Initial Evaluation Education: - Verbal, written and demonstration of respiratory meds, RPE/PD scales, oximetry and breathing techniques. Instruction on use of nebulizers and MDIs: cleaning and proper use, rinsing mouth with steroid doses and importance of monitoring MDI activations. Flowsheet Row  Pulmonary Rehab from 06/19/2016 in [facility] Cardiac and Pulmonary Rehab  Date  05/25/16  Educator  sb  Instruction Review Code  2- meets goals/outcomes      General Nutrition Guidelines/Fats and Fiber: -Group instruction provided by verbal, written material, models and posters to present the general guidelines for heart healthy nutrition. Gives an explanation and review of dietary fats and fiber. Flowsheet Row Pulmonary Rehab from 06/19/2016 in [facility] Cardiac and Pulmonary Rehab  Date  06/08/16  Educator  CR  Instruction Review Code  2- meets goals/outcomes      Controlling Sodium/Reading Food Labels: -Group verbal and written material supporting the discussion of sodium use in heart healthy nutrition. Review and explanation with models, verbal and written materials for utilization of the food label. Flowsheet Row Pulmonary Rehab from 06/19/2016 in [facility] Cardiac and Pulmonary Rehab  Date  06/15/16  Educator  CR  Instruction Review Code  2- meets goals/outcomes      Exercise Physiology & Risk Factors: - Group verbal and written instruction with models to review the exercise physiology of the cardiovascular system and associated critical values. Details cardiovascular disease risk factors and the goals associated with each risk factor.   Aerobic Exercise & Resistance Training: - Gives group verbal and written discussion on the health impact of inactivity. On the components of aerobic and resistive training programs and the benefits of this training and how to safely progress through these programs.   Flexibility, Balance, General Exercise Guidelines: - Provides group verbal and written instruction on the benefits of flexibility and balance training programs. Provides general exercise guidelines with specific guidelines to those with heart or lung disease. Demonstration and skill practice provided. Flowsheet Row Pulmonary Rehab from 06/19/2016 in [facility] Cardiac and Pulmonary Rehab  Date  06/10/16   Educator  AS  Instruction Review Code  2- meets goals/outcomes      Stress Management: - Provides group verbal and written instruction about the health risks of elevated stress, cause of high stress, and healthy ways to reduce stress.   Depression: - Provides group verbal and written instruction on the correlation between heart/lung disease and depressed mood, treatment options, and the stigmas associated with seeking treatment.   Exercise & Equipment Safety: - Individual verbal instruction and demonstration of equipment use and safety with use of the equipment. Flowsheet Row Pulmonary Rehab from 06/19/2016 in [facility] Cardiac and Pulmonary Rehab  Date  06/08/16  Educator  [facility]  Instruction Review Code  2- meets goals/outcomes      Infection Prevention: - Provides verbal and written material to individual with discussion of infection control including proper hand washing and proper equipment cleaning during exercise session. Flowsheet Row Pulmonary Rehab from 06/19/2016 in [facility] Cardiac and Pulmonary Rehab  Date  06/08/16  Educator  [facility]  Instruction Review Code  2- meets goals/outcomes      Falls Prevention: - Provides verbal and written material to individual with discussion of falls prevention and safety. Flowsheet Row Pulmonary Rehab from 06/19/2016 in [facility] Cardiac and Pulmonary Rehab  Date  05/25/16  Educator  SB  Instruction Review Code  2- meets goals/outcomes      Diabetes: - Individual verbal and written instruction to review signs/symptoms of diabetes, desired ranges of glucose level fasting, after meals and with exercise. Advice that pre and post exercise glucose checks will be done for 3 sessions at entry of program.   Chronic Lung Diseases: - Group verbal and written instruction to review new updates, new respiratory medications, new advancements in procedures and treatments. Provide informative websites and "800" numbers of self-education.   Lung Procedures: - Group  verbal and written instruction to describe testing methods done to diagnose lung disease. Review the outcome of test results. Describe the treatment choices: Pulmonary Function Tests, ABGs and oximetry.   Energy Conservation: - Provide group verbal and written instruction for methods to conserve energy, plan and organize activities. Instruct on pacing techniques, use of adaptive equipment and posture/positioning to relieve shortness of breath.   Triggers: - Group verbal and written instruction to review types of environmental controls: home humidity, furnaces, filters, dust mite/pet prevention, HEPA vacuums. To discuss weather changes, air quality and the benefits of nasal washing.   Exacerbations: - Group verbal and written instruction to provide: warning signs, infection symptoms, calling MD promptly, preventive modes, and value of vaccinations. Review: effective airway clearance, coughing and/or vibration techniques. Create an Sports administrator.   Oxygen: - Individual and group verbal and written instruction on oxygen therapy. Includes supplement oxygen, available portable oxygen systems, continuous and intermittent flow rates, oxygen safety, concentrators, and Medicare reimbursement for oxygen.   Respiratory Medications: - Group verbal and written instruction to review medications for lung disease. Drug class, frequency, complications, importance of spacers, rinsing mouth after steroid MDI's, and proper cleaning methods for nebulizers.   AED/CPR: - Group verbal and written instruction with the use of models to demonstrate the basic use of the AED with the basic ABC's of resuscitation. Flowsheet Row Pulmonary Rehab from 06/19/2016 in [facility] Cardiac and Pulmonary Rehab  Date  06/19/16  Educator  CE  Instruction Review Code  2- meets goals/outcomes      Breathing Retraining: - Provides individuals verbal and written instruction on purpose, frequency, and proper technique of diaphragmatic  breathing and pursed-lipped breathing. Applies individual practice skills. Flowsheet Row Pulmonary Rehab from 06/19/2016 in [facility] Cardiac and Pulmonary Rehab  Date  06/08/16  Educator  [employer]  Instruction Review Code  2- meets goals/outcomes      Anatomy and Physiology of the Lungs: - Group verbal and written instruction with the use of models to provide basic lung anatomy and physiology related to function, structure and complications of lung disease.   Heart Failure: - Group verbal and written instruction on the basics of heart failure: signs/symptoms, treatments, explanation of ejection fraction, enlarged heart and cardiomyopathy.   Sleep Apnea: - Individual verbal and written instruction to review Obstructive Sleep Apnea. Review of risk factors, methods for diagnosing and types of masks and machines for OSA.   Anxiety: - Provides group, verbal and written instruction on the correlation between heart/lung disease and anxiety, treatment options, and management of anxiety.   Relaxation: - Provides group, verbal and written instruction about the benefits of relaxation for patients with heart/lung disease. Also provides patients with examples of relaxation techniques.   Knowledge Questionnaire Score:     Knowledge Questionnaire Score - 05/25/16 1632      Knowledge Questionnaire Score   Pre Score 6/10       Core Components/Risk Factors/Patient Goals at Admission:     Personal Goals and Risk Factors at Admission - 05/25/16 1628      Core Components/Risk Factors/Patient Goals on Admission    Weight Management Yes;Obesity   Admit Weight 153 lb 4.8 oz (69.5 kg)   Goal Weight: Short Term 145 lb (65.8 kg)   Goal Weight: Long Term 130 lb (59 kg)   Expected Outcomes Short Term: Continue to assess and modify interventions until short term weight is achieved;Weight Loss: Understanding of general recommendations for a balanced deficit meal plan, which promotes 1-2 lb weight loss per week  and includes a negative energy balance of [phone] kcal/d   Increase Strength and Stamina Yes   Intervention Provide advice, education, support and counseling about physical activity/exercise needs.;Develop an individualized exercise prescription for aerobic and resistive training based on initial evaluation findings, risk stratification, comorbidities and participant's personal goals.   Expected Outcomes Achievement of increased cardiorespiratory fitness and enhanced flexibility, muscular endurance and strength shown through measurements of functional capacity and personal statement of participant.   Improve shortness of breath with ADL's Yes   Intervention Provide education, individualized exercise plan and daily activity instruction to help decrease symptoms of SOB with activities of daily living.   Expected Outcomes Short Term: Achieves a reduction of symptoms when performing activities of daily living.   Develop more efficient breathing techniques such as purse lipped breathing and diaphragmatic breathing; and practicing self-pacing with activity Yes  HAs several inhalers. Stated understanding of use and need to rinse with steriod inhaler.   Intervention Provide education, demonstration and support about specific breathing techniuqes utilized for more efficient breathing. Include techniques such as pursed lipped breathing, diaphragmatic breathing and self-pacing activity.   Expected Outcomes Short Term: Participant will be able to demonstrate and use breathing techniques as needed throughout daily activities.   Hypertension Yes   Intervention Provide education on lifestyle modifcations including regular physical activity/exercise, weight management, moderate sodium restriction and increased consumption of fresh fruit, vegetables, and low fat dairy, alcohol moderation, and smoking cessation.;Monitor prescription use compliance.   Expected Outcomes Short Term: Continued assessment and intervention  until BP is < 140/67m HG in hypertensive participants. < 130/812mHG in hypertensive participants with  diabetes, heart failure or chronic kidney disease.;Long Term: Maintenance of blood pressure at goal levels.   Lipids Yes   Intervention Provide education and support for participant on nutrition & aerobic/resistive exercise along with prescribed medications to achieve LDL <17m, HDL >462m   Expected Outcomes Short Term: Participant states understanding of desired cholesterol values and is compliant with medications prescribed. Participant is following exercise prescription and nutrition guidelines.;Long Term: Cholesterol controlled with medications as prescribed, with individualized exercise RX and with personalized nutrition plan. Value goals: LDL < 7039mHDL > 40 mg.      Core Components/Risk Factors/Patient Goals Review:      Goals and Risk Factor Review    Row Name 06/08/16 1122 06/10/16 1306 06/12/16 1458 06/12/16 1504 06/15/16 1040     Core Components/Risk Factors/Patient Goals Review   Personal Goals Review Develop more efficient breathing techniques such as purse lipped breathing and diaphragmatic breathing and practicing self-pacing with activity. Weight Management/Obesity;Hypertension Increase knowledge of respiratory medications and ability to use respiratory devices properly. Improve shortness of breath with ADL's Sedentary;Increase Strength and Stamina   Review Pursed lip breathing techniques were reviewed with the patient. She demonstrated understanding of pursed lip breathing and was able to use this breathing technique during her exercise today to help control shortness of breath. [facility].  Today is her 2nd session, so I reviewed the basics of keeping control with her medication, nutrition and exercise. She stated she was not sure if she wanted to meet with the RD. Explained, that the RD evaluation can help balance her  nutrition n plan. control.   Reviewed Mr. ClaLacksdications with her today.  She knows when and how to take them.  She also has an aerochamber to use with her MDI's. Ms. ClaBazinetys that she already feels stronger after her first week of exercise.   DorChaelyn off to a good start in rehab.  She is already feeling stronger and seems to have an increase in stamina.  She is doing the weights and stretches at home!   Expected Outcomes Patient will continue to use pursed lip breathing techniques to control SOB during exercise in order to be able to continue to progress and increase in strength and stamina. Continued exercise, nutrion and medication use to help with Risk Factor control.  Obtain appoinment with thte RD to see if there are any nutrition changes she can make to help with weight control.  This knowledge should help keep inflamation and excerbations to a minimum . Continuing to exercise should help decrease her SOB and increase her ability to preform ADLs.  DorMarneell continue to come to exercise and education.  We will discuss a home exercise program soon.      Core Components/Risk Factors/Patient Goals at Discharge (Final Review):      Goals and Risk Factor Review - 06/15/16 1040      Core Components/Risk Factors/Patient Goals Review   Personal Goals Review Sedentary;Increase Strength and Stamina   Review DorJeanie off to a good start in rehab.  She is already feeling stronger and seems to have an increase in stamina.  She is doing the weights and stretches at home!   Expected Outcomes DorRuqayyahll continue to come to exercise and education.  We will discuss a home exercise program soon.      ITP Comments:     ITP Comments    Row Name 05/25/16 [id number]  ITP Comments Medical Review completed today with ITP. Continue with ITP Chart documentation sent for [employer].          Comments: 30 Day Review

## 2016-06-22 NOTE — Progress Notes (Signed)
Daily Session Note  Patient Details  Name: WALESKA BUTTERY MRN: [id number] Date of Birth: [birth year] Referring Provider:   April Manson Pulmonary Rehab from 05/25/2016 in [facility] Cardiac and Pulmonary Rehab  Referring Provider  [name], MD.      Encounter Date: 06/22/2016  Check In:     Session Check In - 06/22/16 1123      Check-In   Location [facility]-Cardiac & Pulmonary Rehab   Staff Present Carson Myrtle, BS, RRT, Respiratory Therapist;Jamyra Zweig Amedeo Plenty, BS, ACSM CEP, Exercise Physiologist;Jessica Luan Pulling, MA, ACSM RCEP, Exercise Physiologist   Supervising physician immediately available to respond to emergencies [name]   Physician(s) Burlene Arnt and Kinner   Medication changes reported     No   Fall or balance concerns reported    No   Warm-up and Cool-down Performed on first and last piece of equipment   Resistance Training Performed Yes   VAD Patient? No     Pain Assessment   Currently in Pain? No/denies   Multiple Pain Sites No         Goals Met:  Proper associated with RPD/PD & O2 Sat Independence with exercise equipment Exercise tolerated well Strength training completed today  Goals Unmet:  Not Applicable  Comments: Pt able to follow exercise prescription today without complaint.  Will continue to monitor for progression.    Dr. Emily Filbert is Medical Director for [place] and [name].

## 2016-06-24 DIAGNOSIS — J449 Chronic obstructive pulmonary disease, unspecified: Secondary | ICD-10-CM | POA: Diagnosis not present

## 2016-06-24 NOTE — Progress Notes (Signed)
Daily Session Note  Patient Details  Name: Lori Brady MRN: [id number] Date of Birth: Jul 04, 1946 Referring Provider:   April Manson Pulmonary Rehab from 05/25/2016 in [facility] Cardiac and Pulmonary Rehab  Referring Provider  [name], MD.      Encounter Date: 06/24/2016  Check In:     Session Check In - 06/24/16 1242      Check-In   Location [facility]-Cardiac & Pulmonary Rehab   Staff Present Heath Lark, RN, BSN, CCRP;Laureen Owens Shark, BS, RRT, Respiratory Dareen Piano, BA, ACSM CEP, Exercise Physiologist   Supervising physician immediately available to respond to emergencies [name]   Physician(s) Marcelene Butte and Jimmye Norman   Medication changes reported     No   Fall or balance concerns reported    No   Warm-up and Cool-down Performed on first and last piece of equipment   Resistance Training Performed Yes   VAD Patient? No     Pain Assessment   Currently in Pain? No/denies   Multiple Pain Sites No         Goals Met:  Proper associated with RPD/PD & O2 Sat Independence with exercise equipment Exercise tolerated well Strength training completed today  Goals Unmet:  Not Applicable  Comments: Pt able to follow exercise prescription today without complaint.  Will continue to monitor for progression.    Dr. Emily Filbert is Medical Director for [place] and [name].

## 2016-06-26 ENCOUNTER — Encounter: Payer: Medicare Other | Admitting: *Deleted

## 2016-06-26 DIAGNOSIS — J449 Chronic obstructive pulmonary disease, unspecified: Secondary | ICD-10-CM | POA: Diagnosis not present

## 2016-06-26 NOTE — Progress Notes (Signed)
Daily Session Note  Patient Details  Name: Lori Brady MRN: [id number] Date of Birth: 08/05/1946 Referring Provider:   April Manson Pulmonary Rehab from 05/25/2016 in [facility] Cardiac and Pulmonary Rehab  Referring Provider  [name], MD.      Encounter Date: 06/26/2016  Check In:     Session Check In - 06/26/16 1134      Check-In   Staff Present Heath Lark, RN, BSN, CCRP;Laureen Owens Shark, BS, RRT, Respiratory Therapist;Carless Slatten, RN, BSN   Supervising physician immediately available to respond to emergencies [name]   Physician(s) Drs Jacqualine Code and Corky Downs   Medication changes reported     No   Fall or balance concerns reported    No   Warm-up and Cool-down Performed on first and last piece of equipment   Resistance Training Performed Yes   VAD Patient? No     Pain Assessment   Currently in Pain? No/denies         Goals Met:  Proper associated with RPD/PD & O2 Sat Exercise tolerated well Strength training completed today  Goals Unmet:  Not Applicable  Comments: Doing well with exercise Progression   Dr. Emily Filbert is Medical Director for [place] and [name].

## 2016-06-29 ENCOUNTER — Encounter: Payer: Medicare Other | Admitting: *Deleted

## 2016-06-29 DIAGNOSIS — J449 Chronic obstructive pulmonary disease, unspecified: Secondary | ICD-10-CM

## 2016-06-29 NOTE — Progress Notes (Signed)
Daily Session Note  Patient Details  Name: Lori Brady MRN: [id number] Date of Birth: 1946/09/18 Referring Provider:   April Manson Pulmonary Rehab from 05/25/2016 in [facility] Cardiac and Pulmonary Rehab  Referring Provider  [name], MD.      Encounter Date: 06/29/2016  Check In:     Session Check In - 06/29/16 1018      Check-In   Location [facility]-Cardiac & Pulmonary Rehab   Staff Present Carson Myrtle, BS, RRT, Respiratory Bertis Ruddy, BS, ACSM CEP, Exercise Physiologist;Rebecca Brayton El, DPT, CEEA   Supervising physician immediately available to respond to emergencies [name]   Physician(s) Dr. Bayard Beaver and Dr. Marcelene Butte   Medication changes reported     No   Fall or balance concerns reported    No   Warm-up and Cool-down Performed on first and last piece of equipment   Resistance Training Performed Yes   VAD Patient? No     Pain Assessment   Currently in Pain? No/denies   Multiple Pain Sites No         Goals Met:  Proper associated with RPD/PD & O2 Sat Independence with exercise equipment Exercise tolerated well Strength training completed today  Goals Unmet:  Not Applicable  Comments: Pt able to follow exercise prescription today without complaint.  Will continue to monitor for progression.    Dr. Emily Filbert is Medical Director for [place] and [name].

## 2016-07-01 ENCOUNTER — Encounter: Payer: Medicare Other | Admitting: *Deleted

## 2016-07-01 DIAGNOSIS — J449 Chronic obstructive pulmonary disease, unspecified: Secondary | ICD-10-CM | POA: Diagnosis not present

## 2016-07-01 NOTE — Progress Notes (Signed)
Daily Session Note  Patient Details  Name: Lori Brady MRN: [id number] Date of Birth: 02-09-1946 Referring Provider:   April Brady Pulmonary Rehab from 05/25/2016 in [facility] Cardiac and Pulmonary Rehab  Referring Provider  Lori Bigness, MD.      Encounter Date: 07/01/2016  Check In:     Session Check In - 07/01/16 1009      Check-In   Location [facility]-Cardiac & Pulmonary Rehab   Staff Present Alberteen Sam, MA, ACSM RCEP, Exercise Physiologist;Amanda Oletta Darter, BA, ACSM CEP, Exercise Physiologist;Laureen Owens Shark, BS, RRT, Respiratory Therapist   Supervising physician immediately available to respond to emergencies [name]   Physician(s) Drs. Edd Fabian and McShane   Medication changes reported     No   Fall or balance concerns reported    No   Warm-up and Cool-down Performed as group-led Location manager Performed Yes   VAD Patient? No     Pain Assessment   Currently in Pain? No/denies   Multiple Pain Sites No           Exercise Prescription Changes - 06/30/16 1400      Exercise Review   Progression Yes     Response to Exercise   Blood Pressure (Admit) 128/70   Blood Pressure (Exercise) 134/60   Blood Pressure (Exit) 108/60   Heart Rate (Admit) 98 bpm   Heart Rate (Exercise) 118 bpm   Heart Rate (Exit) 91 bpm   Oxygen Saturation (Admit) 99 %   Oxygen Saturation (Exercise) 95 %   Oxygen Saturation (Exit) 97 %   Rating of Perceived Exertion (Exercise) 14   Perceived Dyspnea (Exercise) 2   Symptoms none   Duration Progress to 45 minutes of aerobic exercise without signs/symptoms of physical distress   Intensity THRR unchanged     Progression   Progression Continue to progress workloads to maintain intensity without signs/symptoms of physical distress.   Average METs 1.97     Resistance Training   Training Prescription Yes   Weight 2 lbs   Reps 10-15     Interval Training   Interval Training No     Treadmill   MPH  1.3   Grade 0   Minutes 11   METs 2     Recumbant Bike   Level 3   RPM 35   Minutes 15   METs 2.1     NuStep   Level 2   Minutes 15   METs 1.8      Goals Met:  Proper associated with RPD/PD & O2 Sat Independence with exercise equipment Using PLB without cueing & demonstrates good technique Exercise tolerated well Strength training completed today  Goals Unmet:  Not Applicable  Comments: Pt able to follow exercise prescription today without complaint.  Will continue to monitor for progression.  Reviewed home exercise with pt today.  Pt plans to walk at home for exercise.  Reviewed THR, pulse, RPE, sign and symptoms, and when to call 911 or MD.  Also discussed weather considerations and indoor options.  Once she graduates, [name] plans to join [employer].  Pt voiced understanding.   Dr. Emily Filbert is Medical Director for [place] and [name].

## 2016-07-03 DIAGNOSIS — J449 Chronic obstructive pulmonary disease, unspecified: Secondary | ICD-10-CM

## 2016-07-03 NOTE — Progress Notes (Signed)
Daily Session Note  Patient Details  Name: Lori Brady MRN: [id number] Date of Birth: Jun 16, 1946 Referring Provider:   April Manson Pulmonary Rehab from 05/25/2016 in [facility] Cardiac and Pulmonary Rehab  Referring Provider  [name], MD.      Encounter Date: 07/03/2016  Check In:     Session Check In - 07/03/16 1036      Check-In   Location [facility]-Cardiac & Pulmonary Rehab   Staff Present Gerlene Burdock, RN, BSN;Stacey Blanch Media, RRT, RCP, Respiratory Dareen Piano, BA, ACSM CEP, Exercise Physiologist   Supervising physician immediately available to respond to emergencies [name]   Physician(s) Alfred Levins and Jimmye Norman   Medication changes reported     No   Fall or balance concerns reported    No   Warm-up and Cool-down Performed on first and last piece of equipment   Resistance Training Performed Yes   VAD Patient? No     Pain Assessment   Currently in Pain? No/denies   Multiple Pain Sites No         Goals Met:  Proper associated with RPD/PD & O2 Sat Independence with exercise equipment Exercise tolerated well Strength training completed today  Goals Unmet:  Not Applicable  Comments: Pt able to follow exercise prescription today without complaint.  Will continue to monitor for progression.    Dr. Emily Filbert is Medical Director for [place] and [name].

## 2016-07-06 ENCOUNTER — Encounter: Payer: Medicare Other | Admitting: *Deleted

## 2016-07-06 DIAGNOSIS — J449 Chronic obstructive pulmonary disease, unspecified: Secondary | ICD-10-CM | POA: Diagnosis not present

## 2016-07-06 NOTE — Progress Notes (Signed)
Daily Session Note  Patient Details  Name: Lori Brady MRN: [id number] Date of Birth: [birth year] Referring Provider:   April Manson Pulmonary Rehab from 05/25/2016 in [facility] Cardiac and Pulmonary Rehab  Referring Provider  [name], MD.      Encounter Date: 07/06/2016  Check In:     Session Check In - 07/06/16 1005      Check-In   Location [facility]-Cardiac & Pulmonary Rehab   Staff Present Carson Myrtle, BS, RRT, Respiratory Therapist;Roseann Kees Amedeo Plenty, BS, ACSM CEP, Exercise Physiologist;Jessica Luan Pulling, MA, ACSM RCEP, Exercise Physiologist   Supervising physician immediately available to respond to emergencies [name]   Physician(s) Alfred Levins and Jimmye Norman    Medication changes reported     No   Fall or balance concerns reported    No   Warm-up and Cool-down Performed on first and last piece of equipment   Resistance Training Performed Yes   VAD Patient? No     Pain Assessment   Currently in Pain? No/denies   Multiple Pain Sites No         Goals Met:  Proper associated with RPD/PD & O2 Sat Independence with exercise equipment Exercise tolerated well Strength training completed today  Goals Unmet:  Not Applicable  Comments: Pt able to follow exercise prescription today without complaint.  Will continue to monitor for progression.    Dr. Emily Filbert is Medical Director for [place] and [name].

## 2016-07-08 ENCOUNTER — Encounter: Payer: Medicare Other | Admitting: *Deleted

## 2016-07-08 DIAGNOSIS — J449 Chronic obstructive pulmonary disease, unspecified: Secondary | ICD-10-CM | POA: Diagnosis not present

## 2016-07-08 NOTE — Progress Notes (Signed)
Daily Session Note  Patient Details  Name: Lori Brady MRN: [id number] Date of Birth: 10-Mar-1946 Referring Provider:   April Manson Pulmonary Rehab from 05/25/2016 in [facility] Cardiac and Pulmonary Rehab  Referring Provider  [name], MD.      Encounter Date: 07/08/2016  Check In:     Session Check In - 07/08/16 1156      Check-In   Location [facility]-Cardiac & Pulmonary Rehab   Staff Present Nyoka Cowden, RN, BSN, Kela Millin, BA, ACSM CEP, Exercise Physiologist;Laureen Janell Quiet, RRT, Respiratory Therapist   Supervising physician immediately available to respond to emergencies [name]   Physician(s) Drs. Raechel Ache and Williams   Medication changes reported     No   Fall or balance concerns reported    No   Warm-up and Cool-down Performed as group-led Location manager Performed Yes   VAD Patient? No     Pain Assessment   Currently in Pain? No/denies   Multiple Pain Sites No         Goals Met:  Proper associated with RPD/PD & O2 Sat Independence with exercise equipment Using PLB without cueing & demonstrates good technique Exercise tolerated well Strength training completed today  Goals Unmet:  Not Applicable  Comments: Pt able to follow exercise prescription today without complaint.  Will continue to monitor for progression.    Dr. Emily Filbert is Medical Director for [place] and [name].

## 2016-07-10 ENCOUNTER — Encounter: Payer: Medicare Other | Admitting: *Deleted

## 2016-07-10 DIAGNOSIS — J449 Chronic obstructive pulmonary disease, unspecified: Secondary | ICD-10-CM | POA: Diagnosis not present

## 2016-07-10 NOTE — Progress Notes (Signed)
Daily Session Note  Patient Details  Name: [name] MRN: [id number] Date of Birth: 12/04/1945 Referring Provider:   April Manson Pulmonary Rehab from 05/25/2016 in [facility] Cardiac and Pulmonary Rehab  Referring Provider  [name], MD.      Encounter Date: 07/10/2016  Check In:     Session Check In - 07/10/16 1151      Check-In   Staff Present Heath Lark, RN, BSN, CCRP;Mary Kellie Shropshire, RN, BSN, Cathlean Cower, RRT, RCP, Respiratory Therapist   Supervising physician immediately available to respond to emergencies [name]   Physician(s) Drs: Clearnce Hasten and Archie Balboa   Medication changes reported     No   Fall or balance concerns reported    No   Warm-up and Cool-down Performed on first and last piece of equipment   Resistance Training Performed Yes   VAD Patient? No     Pain Assessment   Currently in Pain? No/denies         Goals Met:  Proper associated with RPD/PD & O2 Sat Exercise tolerated well Strength training completed today  Goals Unmet:  Not Applicable  Comments: Doing well with exercise prescription progression.    Dr. Emily Filbert is Medical Director for [place] and [name].

## 2016-07-13 ENCOUNTER — Encounter: Payer: Medicare Other | Admitting: *Deleted

## 2016-07-13 DIAGNOSIS — J449 Chronic obstructive pulmonary disease, unspecified: Secondary | ICD-10-CM | POA: Diagnosis not present

## 2016-07-13 NOTE — Progress Notes (Signed)
Daily Session Note  Patient Details  Name: Lori Brady MRN: [id number] Date of Birth: [birth year] Referring Provider:   April Manson Pulmonary Rehab from 05/25/2016 in [facility] Cardiac and Pulmonary Rehab  Referring Provider  [name], MD.      Encounter Date: 07/13/2016  Check In:     Session Check In - 07/13/16 1015      Check-In   Location [facility]-Cardiac & Pulmonary Rehab   Staff Present Alberteen Sam, MA, ACSM RCEP, Exercise Physiologist;Kelly Amedeo Plenty, BS, ACSM CEP, Exercise Physiologist;Laureen Owens Shark, BS, RRT, Respiratory Therapist   Supervising physician immediately available to respond to emergencies [name]   Physician(s) Drs. Edd Fabian and Yao   Medication changes reported     No   Fall or balance concerns reported    No   Warm-up and Cool-down Performed as group-led Location manager Performed Yes   VAD Patient? No     Pain Assessment   Currently in Pain? No/denies   Multiple Pain Sites No         Goals Met:  Proper associated with RPD/PD & O2 Sat Independence with exercise equipment Using PLB without cueing & demonstrates good technique Exercise tolerated well Strength training completed today  Goals Unmet:  Not Applicable  Comments: Pt able to follow exercise prescription today without complaint.  Will continue to monitor for progression.    Dr. Emily Filbert is Medical Director for [name] and [name].

## 2016-07-15 ENCOUNTER — Encounter: Payer: Medicare Other | Admitting: *Deleted

## 2016-07-15 ENCOUNTER — Other Ambulatory Visit: Payer: Self-pay | Admitting: Family Medicine

## 2016-07-15 DIAGNOSIS — J449 Chronic obstructive pulmonary disease, unspecified: Secondary | ICD-10-CM

## 2016-07-15 MED ORDER — ALENDRONATE SODIUM 70 MG PO TABS: 70.0000 mg | ORAL_TABLET | ORAL | 4 refills | Status: DC

## 2016-07-15 NOTE — Telephone Encounter (Signed)
Patient was notified of results. Patient expressed understanding. 

## 2016-07-15 NOTE — Telephone Encounter (Signed)
-----   Message from Malva Limes, MD sent at 07/15/2016  1:39 PM EDT ----- Please advise patient that bone density test shows osteoporosis has gotten significantly worse since 2014. Please double check and make sure she is taken Fosamax (alendronate) once a week. I don't see a recent prescription refill in the EMR.

## 2016-07-15 NOTE — Telephone Encounter (Signed)
Per patient, she has not been taking Fosamax for a while. Patient requested that we send in refills to [facility].

## 2016-07-15 NOTE — Progress Notes (Signed)
Daily Session Note  Patient Details  Name: Lori Brady MRN: [id number] Date of Birth: 06/29/1946 Referring Provider:   April Manson Pulmonary Rehab from 05/25/2016 in [facility] Cardiac and Pulmonary Rehab  Referring Provider  [name], MD.      Encounter Date: 07/15/2016  Check In:     Session Check In - 07/15/16 1451      Check-In   Location [facility]-Cardiac & Pulmonary Rehab   Staff Present Nyoka Cowden, RN, BSN, Kela Millin, BA, ACSM CEP, Exercise Physiologist;Laureen Janell Quiet, RRT, Respiratory Therapist   Supervising physician immediately available to respond to emergencies [name]   Physician(s) Drs. Karma Greaser and Alfred Levins   Medication changes reported     No   Fall or balance concerns reported    No   Warm-up and Cool-down Performed as group-led Location manager Performed Yes   VAD Patient? No     Pain Assessment   Currently in Pain? No/denies   Multiple Pain Sites No         Goals Met:  Proper associated with RPD/PD & O2 Sat Independence with exercise equipment Using PLB without cueing & demonstrates good technique Exercise tolerated well Strength training completed today  Goals Unmet:  Not Applicable  Comments: Pt able to follow exercise prescription today without complaint.  Will continue to monitor for progression.    Dr. Emily Filbert is Medical Director for [place] and [name].

## 2016-07-17 ENCOUNTER — Telehealth: Payer: Self-pay | Admitting: Respiratory Therapy

## 2016-07-17 NOTE — Telephone Encounter (Signed)
Ms. Aman called this morning to let us know that her brother died and will not be attending [name] today.

## 2016-07-21 ENCOUNTER — Encounter: Payer: Self-pay | Admitting: *Deleted

## 2016-07-21 DIAGNOSIS — J449 Chronic obstructive pulmonary disease, unspecified: Secondary | ICD-10-CM

## 2016-07-21 NOTE — Progress Notes (Signed)
Pulmonary Individual Treatment Plan  Patient Details  Name: ALTOVISE WAHLER MRN: [id number] Date of Birth: Apr 15, 1946 Referring Provider:   April Manson Pulmonary Rehab from 05/25/2016 in [facility] Cardiac and Pulmonary Rehab  Referring Provider  [name], MD.      Initial Encounter Date:  Flowsheet Row Pulmonary Rehab from 05/25/2016 in [facility] Cardiac and Pulmonary Rehab  Date  05/25/16  Referring Provider  [name], MD.      Visit Diagnosis: COPD, mild ([place])  Patient's Home Medications on Admission:  Current Outpatient Prescriptions:  .  albuterol (ACCUNEB) 0.63 MG/3ML nebulizer solution, Take 1 ampule by nebulization every 6 (six) hours as needed for wheezing., Disp: , Rfl:  .  alendronate (FOSAMAX) 70 MG tablet, Take 1 tablet (70 mg total) by mouth once a week., Disp: 12 tablet, Rfl: 4 .  aspirin 81 MG tablet, Take 81 mg by mouth daily., Disp: , Rfl:  .  betamethasone valerate (VALISONE) 0.1 % cream, APPLY TOPICALLY TO AFFECTED AREA SPARINGLY 2 TIMES DAILY., Disp: 15 g, Rfl: 5 .  chlorthalidone (HYGROTON) 25 MG tablet, Take 25 mg by mouth daily., Disp: , Rfl:  .  Cholecalciferol (VITAMIN D) 2000 UNITS CAPS, Take 1 capsule by mouth daily., Disp: , Rfl:  .  citalopram (CELEXA) 20 MG tablet, TAKE 1 TABLET BY MOUTH ONCE DAILY., Disp: 90 tablet, Rfl: 1 .  doxepin (SINEQUAN) 25 MG capsule, Take 25 mg by mouth at bedtime. , Disp: , Rfl:  .  fluticasone (FLONASE) 50 MCG/ACT nasal spray, USE 2 SPRAYS IN EACH NOSTRIL ONCE DAILY., Disp: 16 g, Rfl: 11 .  Fluticasone Furoate-Vilanterol (BREO ELLIPTA) 100-25 MCG/INH AEPB, Inhale 1 puff into the lungs daily. , Disp: , Rfl:  .  lisinopril (PRINIVIL,ZESTRIL) 20 MG tablet, , Disp: , Rfl:  .  LORazepam (ATIVAN) 0.5 MG tablet, Take 1 tablet (0.5 mg total) by mouth every 8 (eight) hours as needed. for anxiety, Disp: 90 tablet, Rfl: 5 .  montelukast (SINGULAIR) 10 MG tablet, Take 1 tablet (10 mg total) by mouth daily., Disp: 90 tablet, Rfl: 1 .  Omega-3  Fatty Acids (FISH OIL) 1000 MG CAPS, Take 1 capsule by mouth daily., Disp: , Rfl:  .  omeprazole (PRILOSEC) 20 MG capsule, Take 20 mg by mouth daily., Disp: , Rfl:  .  omeprazole (PRILOSEC) 40 MG capsule, Reported on 05/28/2016, Disp: , Rfl:  .  potassium chloride SA (K-DUR,KLOR-CON) 20 MEQ tablet, TAKE 1 TABLET BY MOUTH TWICE DAILY (Patient taking differently: TAKE 1 TABLET BY MOUTH DAILY), Disp: 60 tablet, Rfl: 5 .  PROVENTIL HFA 108 (90 Base) MCG/ACT inhaler, Inhale 1-2 puffs into the lungs every 4 (four) hours as needed. , Disp: , Rfl:  .  simvastatin (ZOCOR) 40 MG tablet, TAKE 1 TABLET BY MOUTH AT BEDTIME., Disp: 90 tablet, Rfl: 3 .  tiotropium (SPIRIVA) 18 MCG inhalation capsule, Place 1 capsule (18 mcg total) into inhaler and inhale daily., Disp: 90 capsule, Rfl: 1 .  triamcinolone cream (KENALOG) 0.1 %, , Disp: , Rfl:   Past Medical History: Past Medical History:  Diagnosis Date  . Arthritis   . Asthma   . Crohn's disease ([place])   . Emphysema of lung ([place])   . GERD (gastroesophageal reflux disease)   . History of chicken pox   . History of heart attack   . Hx of completed stroke   . Hypercholesteremia   . Hypertension   . Seasonal allergies   . UTI (lower urinary tract infection)  Tobacco Use: History  Smoking Status  . Former Smoker  . Packs/day: 1.00  . Years: 30.00  . Types: Cigarettes  . Quit date: 08/01/2008  Smokeless Tobacco  . Never Used    Labs: Recent Review Flowsheet Data    Labs for ITP Cardiac and Pulmonary Rehab Latest Ref Rng & Units 12/07/2011 04/02/2016   Cholestrol 100 - 199 mg/dL 133 141   LDLCALC 0 - 99 mg/dL 63 78   HDL >39 mg/dL 56 32(L)   Trlycerides 0 - 149 mg/dL 71 155(H)       ADL UCSD:     Pulmonary Assessment Scores    Row Name 05/25/16 1638         ADL UCSD   ADL Phase Entry     SOB Score total 56     Rest 0     Walk 3     Stairs 5     Bath 0     Dress 0     Shop 5        Pulmonary Function Assessment:      Pulmonary Function Assessment - 05/25/16 1634      Pulmonary Function Tests   RV% 67 %   DLCO% 51 %     Initial Spirometry Results   FVC% 68 %   FEV1% 60 %   FEV1/FVC Ratio 70     Post Bronchodilator Spirometry Results   FVC% 75 %   FEV1% 68 %   FEV1/FVC Ratio 72     Breath   Bilateral Breath Sounds Clear   Shortness of Breath No      Exercise Target Goals:    Exercise Program Goal: Individual exercise prescription set with THRR, safety & activity barriers. Participant demonstrates ability to understand and report RPE using BORG scale, to self-measure pulse accurately, and to acknowledge the importance of the exercise prescription.  Exercise Prescription Goal: Starting with aerobic activity 30 plus minutes a day, 3 days per week for initial exercise prescription. Provide home exercise prescription and guidelines that participant acknowledges understanding prior to discharge.  Activity Barriers & Risk Stratification:   6 Minute Walk:     6 Minute Walk    Row Name 05/25/16 1539 07/15/16 1145       6 Minute Walk   Phase Initial Mid Program    Distance 945 feet 1125 feet    Distance % Change  - 19 %    Walk Time 6 minutes 6 minutes    # of Rest Breaks 0 0    MPH 1.79 2.13    METS 2.37 3.2    RPE 11 14    Perceived Dyspnea  2 3    VO2 Peak 8.3 11.24    Symptoms No No    Resting HR 88 bpm 97 bpm    Resting BP 142/80 120/60    Max Ex. HR 115 bpm 126 bpm    Max Ex. BP 158/82 164/72    2 Minute Post BP 138/82  -      Interval HR   Baseline HR 88 97    1 Minute HR 100 107    2 Minute HR 111 117    3 Minute HR 115 121    4 Minute HR 115 122    5 Minute HR 113 126    6 Minute HR 114 120    2 Minute Post HR 95 111    Interval Heart Rate? Yes  -  Interval Oxygen   Interval Oxygen? Yes  -    Baseline Oxygen Saturation % 96 % 97 %    Baseline Liters of Oxygen 0 L  -    1 Minute Oxygen Saturation % 94 % 92 %    1 Minute Liters of Oxygen 0 L  -    2  Minute Oxygen Saturation % 94 % 94 %    2 Minute Liters of Oxygen 0 L  -    3 Minute Oxygen Saturation % 94 % 96 %    3 Minute Liters of Oxygen 0 L  -    4 Minute Oxygen Saturation % 94 % 95 %    4 Minute Liters of Oxygen 0 L  -    5 Minute Oxygen Saturation % 95 % 95 %    5 Minute Liters of Oxygen 0 L  -    6 Minute Oxygen Saturation % 95 % 94 %    6 Minute Liters of Oxygen 0 L  -    2 Minute Post Oxygen Saturation % 96 % 96 %    2 Minute Post Liters of Oxygen 0 L  -       Initial Exercise Prescription:     Initial Exercise Prescription - 05/25/16 1500      Date of Initial Exercise RX and Referring Provider   Date 05/25/16   Referring Provider [name], MD.     Treadmill   MPH 1   Grade 0   Minutes 5   METs 2     Recumbant Bike   Level 2   RPM 50   Minutes 15   METs 2     NuStep   Level 2   Minutes 15   METs 2.5     Biostep-RELP   Level 2   Minutes 15   METs 2.5     Prescription Details   Frequency (times per week) 3   Duration Progress to 30 minutes of continuous aerobic without signs/symptoms of physical distress     Intensity   THRR 40-80% of Max Heartrate 113-140   Ratings of Perceived Exertion 11-13   Perceived Dyspnea 0-4     Progression   Progression Continue progressive overload as per policy without signs/symptoms or physical distress.     Resistance Training   Training Prescription Yes   Weight 2   Reps 10-12      Perform Capillary Blood Glucose checks as needed.  Exercise Prescription Changes:     Exercise Prescription Changes    Row Name 05/25/16 1500 06/08/16 1100 06/17/16 1400 06/30/16 1400 07/01/16 1400     Exercise Review   Progression  -  - Yes Yes Yes     Response to Exercise   Blood Pressure (Admit) 142/80 124/72 132/70 128/70  -   Blood Pressure (Exercise) 158/82 162/80 134/64 134/60  -   Blood Pressure (Exit) 138/68 130/70 126/64 108/60  -   Heart Rate (Admit) 88 bpm 110 bpm 98 bpm 98 bpm  -   Heart Rate  (Exercise) 114 bpm 128 bpm 110 bpm 118 bpm  -   Heart Rate (Exit) 90 bpm 103 bpm 94 bpm 91 bpm  -   Oxygen Saturation (Admit) 96 % 97 % 97 % 99 %  -   Oxygen Saturation (Exercise) 95 % 94 % 94 % 95 %  -   Oxygen Saturation (Exit) 97 % 94 % 94 % 97 %  -  Rating of Perceived Exertion (Exercise) _0 -   Perceived Dyspnea (Exercise)  - _1 -   Symptoms  - none none none none   Comments  -  -  -  - Home Exercise Guidelines given 07/01/16   Duration  - Progress to 30 minutes of continuous aerobic without signs/symptoms of physical distress Progress to 30 minutes of continuous aerobic without signs/symptoms of physical distress Progress to 45 minutes of aerobic exercise without signs/symptoms of physical distress Progress to 45 minutes of aerobic exercise without signs/symptoms of physical distress   Intensity  - THRR unchanged  113-140 THRR unchanged THRR unchanged THRR unchanged     Progression   Progression  - Continue progressive overload as per policy without signs/symptoms or physical distress. Continue progressive overload as per policy without signs/symptoms or physical distress. Continue to progress workloads to maintain intensity without signs/symptoms of physical distress. Continue to progress workloads to maintain intensity without signs/symptoms of physical distress.   Average METs  - 1.9 1.92 1.97 1.97     Resistance Training   Training Prescription  - Yes Yes Yes Yes   Weight  - _2 lbs 2 lbs   Reps  - 10-12 10-12 10-15 10-15     Interval Training   Interval Training  -  - No No No     Treadmill   MPH  - 1 1 1.3 1.3   Grade  - 0 0 0 0   Minutes  - _3 METs  - 2 1.77 2 2     Recumbant Bike   Level  - _4 RPM  - 50 35 35 35   Minutes  - _5 METs  - 2 2.2 2.1 2.1     NuStep   Level  - _6 Minutes  - _7 METs  - 2 1.8 1.8 1.8     Home Exercise Plan   Plans to continue exercise at  -  -  -  - Home  Walking with plans  to join [employer] after graduation   Frequency  -  -  -  - Add 1 additional day to program exercise sessions.   Row Name 07/15/16 1400             Exercise Review   Progression Yes         Response to Exercise   Blood Pressure (Admit) 120/60       Blood Pressure (Exercise) 132/50       Blood Pressure (Exit) 120/64       Heart Rate (Admit) 94 bpm       Heart Rate (Exercise) 124 bpm       Heart Rate (Exit) 86 bpm       Oxygen Saturation (Admit) 99 %       Oxygen Saturation (Exercise) 94 %       Oxygen Saturation (Exit) 95 %       Rating of Perceived Exertion (Exercise) 13       Perceived Dyspnea (Exercise) 2       Symptoms none       Comments Home Exercise Guidelines given 07/01/16       Duration Progress to 45 minutes of aerobic exercise without signs/symptoms of physical distress       Intensity THRR unchanged  Progression   Progression Continue to progress workloads to maintain intensity without signs/symptoms of physical distress.       Average METs 2.25         Resistance Training   Training Prescription Yes       Weight 2 lbs       Reps 10-15         Interval Training   Interval Training No         Treadmill   MPH 1.5       Grade 1       Minutes 15         Recumbant Bike   Level 4       RPM 37       Minutes 15       METs 2.3         NuStep   Level 4       Minutes 15       METs 2.1         Home Exercise Plan   Plans to continue exercise at Home  Walking with plans to join [employer] after graduation       Frequency Add 1 additional day to program exercise sessions.          Exercise Comments:     Exercise Comments    Row Name 06/08/16 1121 06/17/16 1419 06/30/16 1452 07/01/16 1117 07/15/16 1452   Exercise Comments First full day of exercise!  Patient was oriented to gym and equipment including functions, settings, policies, and procedures.  Patient's individual exercise prescription and treatment plan were reviewed.  All starting  workloads were established based on the results of the 6 minute walk test done at initial orientation visit.  The plan for exercise progression was also introduced and progression will be customized based on patient's performance and goals. Javonna is off to a good start with exercise.  She is already starting to see some improvements.  We will continue to monitor for progression. Cincere is doing very well.  She is already up to 1.3 mph and 11 min on the treadmill.  We will continue to montior her progression.  Reviewed home exercise with pt today.  Pt plans to walk at home for exercise.  Reviewed THR, pulse, RPE, sign and symptoms, and when to call 911 or MD.  Also discussed weather considerations and indoor options.  Once she graduates, Channell plans to join [employer].  Pt voiced understanding. Kimberlin continues to do well with exercise.  She is up to 1.5 mph on the treadmill and level 4 on both the recumbent bike and stepper.  All for a full 15 minutes!!!  We will continue to monitor for progression.      Discharge Exercise Prescription (Final Exercise Prescription Changes):     Exercise Prescription Changes - 07/15/16 1400      Exercise Review   Progression Yes     Response to Exercise   Blood Pressure (Admit) 120/60   Blood Pressure (Exercise) 132/50   Blood Pressure (Exit) 120/64   Heart Rate (Admit) 94 bpm   Heart Rate (Exercise) 124 bpm   Heart Rate (Exit) 86 bpm   Oxygen Saturation (Admit) 99 %   Oxygen Saturation (Exercise) 94 %   Oxygen Saturation (Exit) 95 %   Rating of Perceived Exertion (Exercise) 13   Perceived Dyspnea (Exercise) 2   Symptoms none   Comments Home Exercise Guidelines given 07/01/16   Duration Progress to 45 minutes  of aerobic exercise without signs/symptoms of physical distress   Intensity THRR unchanged     Progression   Progression Continue to progress workloads to maintain intensity without signs/symptoms of physical distress.   Average METs 2.25      Resistance Training   Training Prescription Yes   Weight 2 lbs   Reps 10-15     Interval Training   Interval Training No     Treadmill   MPH 1.5   Grade 1   Minutes 15     Recumbant Bike   Level 4   RPM 37   Minutes 15   METs 2.3     NuStep   Level 4   Minutes 15   METs 2.1     Home Exercise Plan   Plans to continue exercise at Home  Walking with plans to join [employer] after graduation   Frequency Add 1 additional day to program exercise sessions.       Nutrition:  Target Goals: Understanding of nutrition guidelines, daily intake of sodium <1537m, cholesterol <2019m calories 30% from fat and 7% or less from saturated fats, daily to have 5 or more servings of fruits and vegetables.  Biometrics:     Pre Biometrics - 05/25/16 1545      Pre Biometrics   Height _0  (1.6 m)   Weight 153 lb 4.8 oz (69.5 kg)   Waist Circumference 36.5 inches   Hip Circumference 41 inches   Waist to Hip Ratio 0.89 %   BMI (Calculated) 27.2       Nutrition Therapy Plan and Nutrition Goals:     Nutrition Therapy & Goals - 05/25/16 1627      Intervention Plan   Intervention Prescribe, educate and counsel regarding individualized specific dietary modifications aiming towards targeted core components such as weight, hypertension, lipid management, diabetes, heart failure and other comorbidities.   Expected Outcomes Short Term Goal: Understand basic principles of dietary content, such as calories, fat, sodium, cholesterol and nutrients.;Short Term Goal: A plan has been developed with personal nutrition goals set during dietitian appointment.;Long Term Goal: Adherence to prescribed nutrition plan.      Nutrition Discharge: Rate Your Plate Scores:   Psychosocial: Target Goals: Acknowledge presence or absence of depression, maximize coping skills, provide positive support system. Participant is able to verbalize types and ability to use techniques and skills needed for  reducing stress and depression.  Initial Review & Psychosocial Screening:     Initial Psych Review & Screening - 05/25/16 1630      Initial Review   Current issues with History of Depression;Current Psychotropic Meds     Family Dynamics   Good Support System? Yes  Has a very large family unit for support.  Husband passed away in 202005-03-04   Barriers   Psychosocial barriers to participate in program There are no identifiable barriers or psychosocial needs.;The patient should benefit from training in stress management and relaxation.     Screening Interventions   Interventions Encouraged to exercise      Quality of Life Scores:     Quality of Life - 05/25/16 1645      Quality of Life Scores   Health/Function Pre 18.13 %   Socioeconomic Pre 20 %   Psych/Spiritual Pre 19.67 %   Family Pre 20.25 %   GLOBAL Pre 19.09 %      PHQ-9: Recent Review Flowsheet Data    [facility] 05/25/2016 03/31/2016   Decreased  Interest 2 0   Down, Depressed, Hopeless 0 0   PHQ - 2 Score 2 0   Altered sleeping 0 -   Tired, decreased energy 1 -   Change in appetite 1 -   Feeling bad or failure about yourself  0 -   Trouble concentrating 0 -   Moving slowly or fidgety/restless 0 -   Suicidal thoughts 0 -   PHQ-9 Score 4 -   Difficult doing work/chores Somewhat difficult  -      Psychosocial Evaluation and Intervention:     Psychosocial Evaluation - 06/10/16 01-04-08      Psychosocial Evaluation & Interventions   Interventions Encouraged to exercise with the program and follow exercise prescription   Comments Counselor met with Ms. Aydelotte today for initial psychosocial evaluation.   She is a 70 yr old who has suffered with COPD for almost 6 years.  She has a strong support system with sisters, adult children and grandchildren who live close by.  Ms. Sant spouse passed away in 01-04-2004.  She had a stroke in January 03, 2006 and a mild heart attack in Jan 04, 2008.  Ms Lightcap reports sleeping well and  takes ativan as needed to help with this.  She states her appetite is good as well.  Ms. Hutmacher reports a history of depression and anxiety and states her medications are managing any current symptoms.  Ms. Villalona is generally in a positive mood and announced she learned that she is expecting her first [place] and was very excited about this.  Ms Rosado has goals to resume her normal activities of gardening, shopping and traveling.  She would like to feel better as well with increased stamina and strength.  She plans to join a gym after she completes this program in order to maintain consistency in exercising.  Counselor will follow with Ms. Lunsford throughout the course of this program.        Psychosocial Re-Evaluation:     Psychosocial Re-Evaluation    [place] Name 07/01/16 01/03/1034             Psychosocial Re-Evaluation   Comments Counselor met with Ms. Bedoya today for follow up.  She reports progress in the areas of walking better; doing more normal activities; able to shop and get out more and improved mood since coming into this program.  she continues to struggle somewhat with sleep, but reports getting at least 6 hours/night most nights.  Counselor commended Ms. Weide on her hard work and progress made.           Education: Education Goals: Education classes will be provided on a weekly basis, covering required topics. Participant will state understanding/return demonstration of topics presented.  Learning Barriers/Preferences:     Learning Barriers/Preferences - 05/25/16 1631      Learning Barriers/Preferences   Learning Barriers Hearing  Deaf in Right ear since Stroke in 2006-01-03   Learning Preferences None      Education Topics: Initial Evaluation Education: - Verbal, written and demonstration of respiratory meds, RPE/PD scales, oximetry and breathing techniques. Instruction on use of nebulizers and MDIs: cleaning and proper use, rinsing mouth with steroid doses and importance  of monitoring MDI activations. Flowsheet Row Pulmonary Rehab from 07/10/2016 in [facility] Cardiac and Pulmonary Rehab  Date  05/25/16  Educator  sb  Instruction Review Code  2- meets goals/outcomes      General Nutrition Guidelines/Fats and Fiber: -Group instruction provided by verbal, written material, models and posters to present  the general guidelines for heart healthy nutrition. Gives an explanation and review of dietary fats and fiber. Flowsheet Row Pulmonary Rehab from 07/10/2016 in [facility] Cardiac and Pulmonary Rehab  Date  06/08/16  Educator  CR  Instruction Review Code  2- meets goals/outcomes      Controlling Sodium/Reading Food Labels: -Group verbal and written material supporting the discussion of sodium use in heart healthy nutrition. Review and explanation with models, verbal and written materials for utilization of the food label. Flowsheet Row Pulmonary Rehab from 07/10/2016 in [facility] Cardiac and Pulmonary Rehab  Date  06/15/16  Educator  CR  Instruction Review Code  2- meets goals/outcomes      Exercise Physiology & Risk Factors: - Group verbal and written instruction with models to review the exercise physiology of the cardiovascular system and associated critical values. Details cardiovascular disease risk factors and the goals associated with each risk factor. Flowsheet Row Pulmonary Rehab from 07/10/2016 in [facility] Cardiac and Pulmonary Rehab  Date  07/08/16  Educator  AS  Instruction Review Code  2- meets goals/outcomes      Aerobic Exercise & Resistance Training: - Gives group verbal and written discussion on the health impact of inactivity. On the components of aerobic and resistive training programs and the benefits of this training and how to safely progress through these programs.   Flexibility, Balance, General Exercise Guidelines: - Provides group verbal and written instruction on the benefits of flexibility and balance training programs. Provides general exercise  guidelines with specific guidelines to those with heart or lung disease. Demonstration and skill practice provided. Flowsheet Row Pulmonary Rehab from 07/10/2016 in [facility] Cardiac and Pulmonary Rehab  Date  06/10/16  Educator  AS  Instruction Review Code  2- meets goals/outcomes      Stress Management: - Provides group verbal and written instruction about the health risks of elevated stress, cause of high stress, and healthy ways to reduce stress.   Depression: - Provides group verbal and written instruction on the correlation between heart/lung disease and depressed mood, treatment options, and the stigmas associated with seeking treatment.   Exercise & Equipment Safety: - Individual verbal instruction and demonstration of equipment use and safety with use of the equipment. Flowsheet Row Pulmonary Rehab from 07/10/2016 in [facility] Cardiac and Pulmonary Rehab  Date  06/08/16  Educator  [facility]  Instruction Review Code  2- meets goals/outcomes      Infection Prevention: - Provides verbal and written material to individual with discussion of infection control including proper hand washing and proper equipment cleaning during exercise session. Flowsheet Row Pulmonary Rehab from 07/10/2016 in [facility] Cardiac and Pulmonary Rehab  Date  06/08/16  Educator  [facility]  Instruction Review Code  2- meets goals/outcomes      Falls Prevention: - Provides verbal and written material to individual with discussion of falls prevention and safety. Flowsheet Row Pulmonary Rehab from 07/10/2016 in [facility] Cardiac and Pulmonary Rehab  Date  05/25/16  Educator  SB  Instruction Review Code  2- meets goals/outcomes      Diabetes: - Individual verbal and written instruction to review signs/symptoms of diabetes, desired ranges of glucose level fasting, after meals and with exercise. Advice that pre and post exercise glucose checks will be done for 3 sessions at entry of program.   Chronic Lung Diseases: - Group verbal  and written instruction to review new updates, new respiratory medications, new advancements in procedures and treatments. Provide informative websites and "800" numbers of self-education.  Lung Procedures: - Group verbal and written instruction to describe testing methods done to diagnose lung disease. Review the outcome of test results. Describe the treatment choices: Pulmonary Function Tests, ABGs and oximetry.   Energy Conservation: - Provide group verbal and written instruction for methods to conserve energy, plan and organize activities. Instruct on pacing techniques, use of adaptive equipment and posture/positioning to relieve shortness of breath.   Triggers: - Group verbal and written instruction to review types of environmental controls: home humidity, furnaces, filters, dust mite/pet prevention, HEPA vacuums. To discuss weather changes, air quality and the benefits of nasal washing.   Exacerbations: - Group verbal and written instruction to provide: warning signs, infection symptoms, calling MD promptly, preventive modes, and value of vaccinations. Review: effective airway clearance, coughing and/or vibration techniques. Create an Sports administrator. Flowsheet Row Pulmonary Rehab from 07/10/2016 in [facility] Cardiac and Pulmonary Rehab  Date  06/29/16  Educator  LB  Instruction Review Code  2- meets goals/outcomes      Oxygen: - Individual and group verbal and written instruction on oxygen therapy. Includes supplement oxygen, available portable oxygen systems, continuous and intermittent flow rates, oxygen safety, concentrators, and Medicare reimbursement for oxygen.   Respiratory Medications: - Group verbal and written instruction to review medications for lung disease. Drug class, frequency, complications, importance of spacers, rinsing mouth after steroid MDI's, and proper cleaning methods for nebulizers.   AED/CPR: - Group verbal and written instruction with the use of models to  demonstrate the basic use of the AED with the basic ABC's of resuscitation. Flowsheet Row Pulmonary Rehab from 07/10/2016 in [facility] Cardiac and Pulmonary Rehab  Date  06/19/16  Educator  CE  Instruction Review Code  2- meets goals/outcomes      Breathing Retraining: - Provides individuals verbal and written instruction on purpose, frequency, and proper technique of diaphragmatic breathing and pursed-lipped breathing. Applies individual practice skills. Flowsheet Row Pulmonary Rehab from 07/10/2016 in [facility] Cardiac and Pulmonary Rehab  Date  06/08/16  Educator  [facility]  Instruction Review Code  2- meets goals/outcomes      Anatomy and Physiology of the Lungs: - Group verbal and written instruction with the use of models to provide basic lung anatomy and physiology related to function, structure and complications of lung disease. Flowsheet Row Pulmonary Rehab from 07/10/2016 in [facility] Cardiac and Pulmonary Rehab  Date  07/10/16  Educator  [place]  Instruction Review Code  2- meets goals/outcomes      Heart Failure: - Group verbal and written instruction on the basics of heart failure: signs/symptoms, treatments, explanation of ejection fraction, enlarged heart and cardiomyopathy. Flowsheet Row Pulmonary Rehab from 07/10/2016 in [facility] Cardiac and Pulmonary Rehab  Date  07/03/16  Educator  CE [Know your numbers]  Instruction Review Code  2- meets goals/outcomes      Sleep Apnea: - Individual verbal and written instruction to review Obstructive Sleep Apnea. Review of risk factors, methods for diagnosing and types of masks and machines for OSA.   Anxiety: - Provides group, verbal and written instruction on the correlation between heart/lung disease and anxiety, treatment options, and management of anxiety.   Relaxation: - Provides group, verbal and written instruction about the benefits of relaxation for patients with heart/lung disease. Also provides patients with examples of relaxation  techniques. Flowsheet Row Pulmonary Rehab from 07/10/2016 in [facility] Cardiac and Pulmonary Rehab  Date  06/24/16  Educator  [facility]  Instruction Review Code  2- Meets goals/outcomes      Knowledge  Questionnaire Score:     Knowledge Questionnaire Score - 05/25/16 1632      Knowledge Questionnaire Score   Pre Score 6/10       Core Components/Risk Factors/Patient Goals at Admission:     Personal Goals and Risk Factors at Admission - 05/25/16 1628      Core Components/Risk Factors/Patient Goals on Admission    Weight Management Yes;Obesity   Admit Weight 153 lb 4.8 oz (69.5 kg)   Goal Weight: Short Term 145 lb (65.8 kg)   Goal Weight: Long Term 130 lb (59 kg)   Expected Outcomes Short Term: Continue to assess and modify interventions until short term weight is achieved;Weight Loss: Understanding of general recommendations for a balanced deficit meal plan, which promotes 1-2 lb weight loss per week and includes a negative energy balance of [phone] kcal/d   Increase Strength and Stamina Yes   Intervention Provide advice, education, support and counseling about physical activity/exercise needs.;Develop an individualized exercise prescription for aerobic and resistive training based on initial evaluation findings, risk stratification, comorbidities and participant's personal goals.   Expected Outcomes Achievement of increased cardiorespiratory fitness and enhanced flexibility, muscular endurance and strength shown through measurements of functional capacity and personal statement of participant.   Improve shortness of breath with ADL's Yes   Intervention Provide education, individualized exercise plan and daily activity instruction to help decrease symptoms of SOB with activities of daily living.   Expected Outcomes Short Term: Achieves a reduction of symptoms when performing activities of daily living.   Develop more efficient breathing techniques such as purse lipped breathing and diaphragmatic  breathing; and practicing self-pacing with activity Yes  HAs several inhalers. Stated understanding of use and need to rinse with steriod inhaler.   Intervention Provide education, demonstration and support about specific breathing techniuqes utilized for more efficient breathing. Include techniques such as pursed lipped breathing, diaphragmatic breathing and self-pacing activity.   Expected Outcomes Short Term: Participant will be able to demonstrate and use breathing techniques as needed throughout daily activities.   Hypertension Yes   Intervention Provide education on lifestyle modifcations including regular physical activity/exercise, weight management, moderate sodium restriction and increased consumption of fresh fruit, vegetables, and low fat dairy, alcohol moderation, and smoking cessation.;Monitor prescription use compliance.   Expected Outcomes Short Term: Continued assessment and intervention until BP is < 140/98m HG in hypertensive participants. < 130/854mHG in hypertensive participants with diabetes, heart failure or chronic kidney disease.;Long Term: Maintenance of blood pressure at goal levels.   Lipids Yes   Intervention Provide education and support for participant on nutrition & aerobic/resistive exercise along with prescribed medications to achieve LDL <7027mHDL >89m90m Expected Outcomes Short Term: Participant states understanding of desired cholesterol values and is compliant with medications prescribed. Participant is following exercise prescription and nutrition guidelines.;Long Term: Cholesterol controlled with medications as prescribed, with individualized exercise RX and with personalized nutrition plan. Value goals: LDL < 70mg42mL > 40 mg.      Core Components/Risk Factors/Patient Goals Review:      Goals and Risk Factor Review    Row Name 06/08/16 1122 06/10/16 1306 06/12/16 1458 06/12/16 1504 06/15/16 1040     Core Components/Risk Factors/Patient Goals Review    Personal Goals Review Develop more efficient breathing techniques such as purse lipped breathing and diaphragmatic breathing and practicing self-pacing with activity. Weight Management/Obesity;Hypertension Increase knowledge of respiratory medications and ability to use respiratory devices properly. Improve shortness of breath with ADL's Sedentary;Increase Strength and  Stamina   Review Pursed lip breathing techniques were reviewed with the patient. She demonstrated understanding of pursed lip breathing and was able to use this breathing technique during her exercise today to help control shortness of breath. TAlked to [facility] today about weight and blood pressure risk factors.  Today is her 2nd session, so I reviewed the basics of keeping control with her medication, nutrition and exercise. She stated she was not sure if she wanted to meet with the RD. Explained, that the RD evaluation can help balance her nutrition n plan. control.   Reviewed Mr. Budzinski medications with her today.  She knows when and how to take them.  She also has an aerochamber to use with her MDI's. Ms. Creason says that she already feels stronger after her first week of exercise.   Veera is off to a good start in rehab.  She is already feeling stronger and seems to have an increase in stamina.  She is doing the weights and stretches at home!   Expected Outcomes Patient will continue to use pursed lip breathing techniques to control SOB during exercise in order to be able to continue to progress and increase in strength and stamina. Continued exercise, nutrion and medication use to help with Risk Factor control.  Obtain appoinment with thte RD to see if there are any nutrition changes she can make to help with weight control.  This knowledge should help keep inflamation and excerbations to a minimum . Continuing to exercise should help decrease her SOB and increase her ability to preform ADLs.  Annjeanette will continue to come to exercise and  education.  We will discuss a home exercise program soon.   [place] Name 07/03/16 1353 07/03/16 1359 07/03/16 1401 07/06/16 1043       Core Components/Risk Factors/Patient Goals Review   Personal Goals Review Increase knowledge of respiratory medications and ability to use respiratory devices properly. Develop more efficient breathing techniques such as purse lipped breathing and diaphragmatic breathing and practicing self-pacing with activity. Improve shortness of breath with ADL's Sedentary;Increase Strength and Stamina    Review Ms. Lickteig takes Proventil, Spiriva, Breo Ellipta, and Albuterol for her respiratory issues.  She has been taking them for a while and knows the proper techiques on how to take all the medications. She has a spacer at home for the Proventil.  She says that the Memory Dance has helped to most with her SOB since she started taking it.  Ms. Widmann show proper technique for PLB when she is SOB and exercising. Ms. Capitano is having less SOB and is finding it easier to walk longer distances and when taking the stairs.  She is having less shuffling of her feet and less stumbling. Ms. Hartung plans to start some home exercise soon, but has already been consistantly coming to class. She reports walking better with more confidence. She also stated that her strength and stamina are imporving.     Expected Outcomes If Tifani continues to take her medicatons as scheduled and when needed, this should help decrease exacerbations and SOB for her.  The technique should help her recover from SOB quicker and help her if she is having anxiety or exacerbations.  Continued exercise should help Ms. Dehne increase her muscle strength and lung health as she progresses in the program.  Ms. Mcinerney will soon begin adding home exercise to her routine which will increase her frequency of exercise and continue to aid in strength and stamina improvments.  Core Components/Risk Factors/Patient Goals at Discharge (Final  Review):      Goals and Risk Factor Review - 07/06/16 1043      Core Components/Risk Factors/Patient Goals Review   Personal Goals Review Sedentary;Increase Strength and Stamina   Review Ms. Packham plans to start some home exercise soon, but has already been consistantly coming to class. She reports walking better with more confidence. She also stated that her strength and stamina are imporving.    Expected Outcomes Ms. Tarleton will soon begin adding home exercise to her routine which will increase her frequency of exercise and continue to aid in strength and stamina improvments.       ITP Comments:     ITP Comments    Row Name 05/25/16 1605           ITP Comments Medical Review completed today with ITP. Continue with ITP Chart documentation sent for [employer].          Comments: 30 Day Review

## 2016-07-22 ENCOUNTER — Encounter: Payer: Medicare Other | Attending: Internal Medicine

## 2016-07-22 DIAGNOSIS — J449 Chronic obstructive pulmonary disease, unspecified: Secondary | ICD-10-CM | POA: Insufficient documentation

## 2016-07-24 ENCOUNTER — Encounter: Payer: Medicare Other | Admitting: *Deleted

## 2016-07-24 DIAGNOSIS — J449 Chronic obstructive pulmonary disease, unspecified: Secondary | ICD-10-CM | POA: Diagnosis not present

## 2016-07-24 NOTE — Progress Notes (Signed)
Daily Session Note  Patient Details  Name: Lori Brady MRN: [id number] Date of Birth: May 12, 1946 Referring Provider:   April Manson Pulmonary Rehab from 05/25/2016 in [facility] Cardiac and Pulmonary Rehab  Referring Provider  [name], MD.      Encounter Date: 07/24/2016  Check In:     Session Check In - 07/24/16 1245      Check-In   Staff Present Heath Lark, RN, BSN, CCRP;Carroll Enterkin, RN, Michaela Corner, RRT, RCP, Respiratory Therapist   Supervising physician immediately available to respond to emergencies [name]   Physician(s) Drs: Corky Downs and Burlene Arnt   Medication changes reported     No   Fall or balance concerns reported    No   Warm-up and Cool-down Performed as group-led instruction   Resistance Training Performed Yes     Pain Assessment   Currently in Pain? No/denies         Goals Met:  Proper associated with RPD/PD & O2 Sat Exercise tolerated well Strength training completed today  Goals Unmet:  Not Applicable  Comments: Doing well with exercise prescription progression.    Dr. Emily Filbert is Medical Director for [place] and [name].

## 2016-07-27 ENCOUNTER — Encounter: Payer: Medicare Other | Admitting: *Deleted

## 2016-07-27 DIAGNOSIS — J449 Chronic obstructive pulmonary disease, unspecified: Secondary | ICD-10-CM | POA: Diagnosis not present

## 2016-07-27 NOTE — Progress Notes (Signed)
Daily Session Note  Patient Details  Name: GENEVEIVE FURNESS MRN: [id number] Date of Birth: 1946/03/06 Referring Provider:   April Manson Pulmonary Rehab from 05/25/2016 in [facility] Cardiac and Pulmonary Rehab  Referring Provider  [name], MD.      Encounter Date: 07/27/2016  Check In:     Session Check In - 07/27/16 1047      Check-In   Location [facility]-Cardiac & Pulmonary Rehab   Staff Present Carson Myrtle, BS, RRT, Respiratory Bertis Ruddy, BS, ACSM CEP, Exercise Physiologist;Amanda Oletta Darter, BA, ACSM CEP, Exercise Physiologist   Supervising physician immediately available to respond to emergencies [name]   Physician(s) Dr. Quentin Cornwall and Dr. Jimmye Norman   Medication changes reported     No   Fall or balance concerns reported    No   Warm-up and Cool-down Performed on first and last piece of equipment   Resistance Training Performed Yes   VAD Patient? No     Pain Assessment   Currently in Pain? No/denies   Multiple Pain Sites No         Goals Met:  Proper associated with RPD/PD & O2 Sat Independence with exercise equipment Exercise tolerated well Strength training completed today  Goals Unmet:  Not Applicable  Comments: Pt able to follow exercise prescription today without complaint.  Will continue to monitor for progression.    Dr. Emily Filbert is Medical Director for [place] and [name].

## 2016-07-29 DIAGNOSIS — J449 Chronic obstructive pulmonary disease, unspecified: Secondary | ICD-10-CM

## 2016-07-29 NOTE — Progress Notes (Signed)
Daily Session Note  Patient Details  Name: Lori Brady MRN: [id number] Date of Birth: 05-07-1946 Referring Provider:   April Manson Pulmonary Rehab from 05/25/2016 in [facility] Cardiac and Pulmonary Rehab  Referring Provider  [name], MD.      Encounter Date: 07/29/2016  Check In:     Session Check In - 07/29/16 1205      Check-In   Location [facility]-Cardiac & Pulmonary Rehab   Staff Present Carson Myrtle, BS, RRT, Respiratory Lennie Hummer, MA, ACSM RCEP, Exercise Physiologist;Amanda Oletta Darter, BA, ACSM CEP, Exercise Physiologist   Supervising physician immediately available to respond to emergencies [name]   Physician(s) Paduchowitz and Edd Fabian   Medication changes reported     No   Fall or balance concerns reported    No   Warm-up and Cool-down Performed on first and last piece of equipment   Resistance Training Performed Yes   VAD Patient? No     Pain Assessment   Currently in Pain? No/denies   Multiple Pain Sites No           Exercise Prescription Changes - 07/28/16 1400      Exercise Review   Progression Yes     Response to Exercise   Blood Pressure (Admit) 114/74   Blood Pressure (Exercise) 122/62   Blood Pressure (Exit) 96/54   Heart Rate (Admit) 102 bpm   Heart Rate (Exercise) 115 bpm   Heart Rate (Exit) 85 bpm   Oxygen Saturation (Admit) 98 %   Oxygen Saturation (Exercise) 97 %   Oxygen Saturation (Exit) 96 %   Rating of Perceived Exertion (Exercise) 15   Perceived Dyspnea (Exercise) 2   Symptoms none   Comments Home Exercise Guidelines given 07/01/16   Duration Progress to 45 minutes of aerobic exercise without signs/symptoms of physical distress   Intensity THRR unchanged     Progression   Progression Continue to progress workloads to maintain intensity without signs/symptoms of physical distress.   Average METs 2.27     Resistance Training   Training Prescription Yes   Weight 2 lbs   Reps 10-15     Interval Training   Interval Training No     Treadmill   MPH 1.7   Grade 1   Minutes 15   METs 2.42     Recumbant Bike   Level 4   RPM 35   Minutes 15   METs 2.3     NuStep   Level 4   Minutes 15   METs 2.1     Home Exercise Plan   Plans to continue exercise at Home  Walking with plans to join [employer] after graduation   Frequency Add 2 additional days to program exercise sessions.      Goals Met:  Proper associated with RPD/PD & O2 Sat Independence with exercise equipment Exercise tolerated well Strength training completed today  Goals Unmet:  Not Applicable  Comments: Pt able to follow exercise prescription today without complaint.  Will continue to monitor for progression.    Dr. Emily Filbert is Medical Director for [place] and [name].

## 2016-07-31 ENCOUNTER — Encounter: Payer: Medicare Other | Admitting: Respiratory Therapy

## 2016-07-31 DIAGNOSIS — J449 Chronic obstructive pulmonary disease, unspecified: Secondary | ICD-10-CM

## 2016-07-31 NOTE — Progress Notes (Signed)
Daily Session Note  Patient Details  Name: NIKELLE MALATESTA MRN: [id number] Date of Birth: September 15, 1946 Referring Provider:   April Manson Pulmonary Rehab from 05/25/2016 in [facility] Cardiac and Pulmonary Rehab  Referring Provider  [name], MD.      Encounter Date: 07/31/2016  Check In:     Session Check In - 07/31/16 1103      Check-In   Location [facility]-Cardiac & Pulmonary Rehab   Staff Present Heath Lark, RN, BSN, CCRP;Martavis Gurney Blanch Media, RRT, RCP, Respiratory Therapist;Carroll Enterkin, RN, BSN   Supervising physician immediately available to respond to emergencies [name]   Physician(s) Dr. Quentin Cornwall & McShane   Medication changes reported     No   Fall or balance concerns reported    No   Warm-up and Cool-down Performed on first and last piece of equipment   Resistance Training Performed Yes   VAD Patient? No     Pain Assessment   Currently in Pain? No/denies         Goals Met:  Proper associated with RPD/PD & O2 Sat Independence with exercise equipment Exercise tolerated well Strength training completed today  Goals Unmet:  Not Applicable  Comments: Pt able to follow exercise prescription today without complaint.  Will continue to monitor for progression.      Dr. Emily Filbert is Medical Director for [place] and [name]monary [facility].

## 2016-08-05 ENCOUNTER — Encounter: Payer: Medicare Other | Admitting: *Deleted

## 2016-08-05 DIAGNOSIS — J449 Chronic obstructive pulmonary disease, unspecified: Secondary | ICD-10-CM

## 2016-08-05 NOTE — Progress Notes (Signed)
Daily Session Note  Patient Details  Name: Lori Brady MRN: [id number] Date of Birth: [birth year] Referring Provider:   April Manson Pulmonary Rehab from 05/25/2016 in [facility] Cardiac and Pulmonary Rehab  Referring Provider  [name], MD.      Encounter Date: 08/05/2016  Check In:     Session Check In - 08/05/16 1151      Check-In   Location [facility]-Cardiac & Pulmonary Rehab   Staff Present Nyoka Cowden, RN, BSN, Willette Pa, MA, ACSM RCEP, Exercise Physiologist;Amanda Oletta Darter, BA, ACSM CEP, Exercise Physiologist   Physician(s) Tilda Franco and Marcelene Butte   Medication changes reported     No   Fall or balance concerns reported    No   Warm-up and Cool-down Performed on first and last piece of equipment   Resistance Training Performed Yes   VAD Patient? No     Pain Assessment   Currently in Pain? No/denies         Goals Met:  Independence with exercise equipment Exercise tolerated well No report of cardiac concerns or symptoms Strength training completed today  Goals Unmet:  Not Applicable  Comments:    Dr. Emily Filbert is Medical Director for [place] and [name].

## 2016-08-07 ENCOUNTER — Encounter: Payer: Medicare Other | Admitting: *Deleted

## 2016-08-07 ENCOUNTER — Other Ambulatory Visit: Payer: Self-pay | Admitting: Family Medicine

## 2016-08-07 DIAGNOSIS — J449 Chronic obstructive pulmonary disease, unspecified: Secondary | ICD-10-CM | POA: Diagnosis not present

## 2016-08-07 DIAGNOSIS — F419 Anxiety disorder, unspecified: Secondary | ICD-10-CM

## 2016-08-07 NOTE — Progress Notes (Signed)
Daily Session Note  Patient Details  Name: Lori Brady MRN: [id number] Date of Birth: [birth year] Referring Provider:   April Manson Pulmonary Rehab from 05/25/2016 in [facility] Cardiac and Pulmonary Rehab  Referring Provider  [name], MD.      Encounter Date: 08/07/2016  Check In:     Session Check In - 08/07/16 1103      Check-In   Location [facility]-Cardiac & Pulmonary Rehab   Staff Present Heath Lark, RN, BSN, CCRP;Gwynevere Lizana, RN, Michaela Corner, RRT, RCP, Respiratory Therapist   Supervising physician immediately available to respond to emergencies [name]   Physician(s) Dr. Mariea Clonts and Dr. Clearnce Hasten   Medication changes reported     No   Fall or balance concerns reported    No   Warm-up and Cool-down Performed as group-led instruction   Resistance Training Performed Yes   VAD Patient? No     Pain Assessment   Currently in Pain? No/denies         Goals Met:  Proper associated with RPD/PD & O2 Sat Exercise tolerated well  Goals Unmet:  Not Applicable  Comments:     Dr. Emily Filbert is Medical Director for [place] and [name].

## 2016-08-10 ENCOUNTER — Encounter: Payer: Medicare Other | Admitting: *Deleted

## 2016-08-10 DIAGNOSIS — J449 Chronic obstructive pulmonary disease, unspecified: Secondary | ICD-10-CM | POA: Diagnosis not present

## 2016-08-10 NOTE — Progress Notes (Signed)
Daily Session Note  Patient Details  Name: Lori Brady MRN: [id number] Date of Birth: 1946-03-31 Referring Provider:   April Manson Pulmonary Rehab from 05/25/2016 in [facility] Cardiac and Pulmonary Rehab  Referring Provider  [name], MD.      Encounter Date: 08/10/2016  Check In:     Session Check In - 08/10/16 1215      Check-In   Location [facility]-Cardiac & Pulmonary Rehab   Staff Present Carson Myrtle, BS, RRT, Respiratory Therapist;[place] Amedeo Plenty, BS, ACSM CEP, Exercise Physiologist;Amanda Oletta Darter, BA, ACSM CEP, Exercise Physiologist   Supervising physician immediately available to respond to emergencies [name]   Physician(s) Mariea Clonts and Clearnce Hasten   Medication changes reported     No   Fall or balance concerns reported    No   Warm-up and Cool-down Performed on first and last piece of equipment   Resistance Training Performed Yes   VAD Patient? No     Pain Assessment   Currently in Pain? No/denies   Multiple Pain Sites No         Goals Met:  Proper associated with RPD/PD & O2 Sat Independence with exercise equipment Exercise tolerated well Strength training completed today  Goals Unmet:  Not Applicable  Comments: Pt able to follow exercise prescription today without complaint.  Will continue to monitor for progression.    Dr. Emily Filbert is Medical Director for [place] and [name].

## 2016-08-12 DIAGNOSIS — J449 Chronic obstructive pulmonary disease, unspecified: Secondary | ICD-10-CM

## 2016-08-12 NOTE — Progress Notes (Signed)
Daily Session Note  Patient Details  Name: Lori Brady MRN: [id number] Date of Birth: April 16, 1946 Referring Provider:   April [facility]on [facility] from 05/25/2016 in [facility] Cardiac and Pulmonary Rehab  Referring Provider  [name], MD.      Encounter Date: 08/12/2016  Check In:     Session Check In - 08/12/16 1219      Check-In   Location [facility]-Cardiac & Pulmonary Rehab   Staff Present Heath Lark, RN, BSN, CCRP;Laureen Owens Shark, BS, RRT, Respiratory Dareen Piano, BA, ACSM CEP, Exercise Physiologist   Supervising physician immediately available to respond to emergencies [name]   Physician(s) Alfred Levins and Joni Fears   Medication changes reported     No   Fall or balance concerns reported    No   Warm-up and Cool-down Performed as group-led Location manager Performed Yes   VAD Patient? No     Pain Assessment   Currently in Pain? No/denies   Multiple Pain Sites No         Goals Met:  Proper associated with RPD/PD & O2 Sat Independence with exercise equipment Exercise tolerated well Strength training completed today  Goals Unmet:  Not Applicable  Comments: Pt able to follow exercise prescription today without complaint.  Will continue to monitor for progression.    Dr. Emily Filbert is Medical Director for [place] and [name].

## 2016-08-14 ENCOUNTER — Encounter: Payer: Medicare Other | Admitting: Respiratory Therapy

## 2016-08-14 DIAGNOSIS — J449 Chronic obstructive pulmonary disease, unspecified: Secondary | ICD-10-CM | POA: Diagnosis not present

## 2016-08-14 NOTE — Progress Notes (Signed)
Daily Session Note  Patient Details  Name: Lori Brady MRN: [id number] Date of Birth: 09-Nov-1946 Referring Provider:   April Manson [facility] Rehab from 05/25/2016 in [facility] Cardiac and Pulmonary Rehab  Referring Provider  [name], MD.      Encounter Date: 08/14/2016  Check In:     Session Check In - 08/14/16 1031      Check-In   Location [facility]-Cardiac & Pulmonary Rehab   Staff Present Heath Lark, RN, BSN, CCRP;Waldine Zenz Blanch Media, RRT, RCP, Respiratory Therapist;Mary Kellie Shropshire, RN, BSN, MA   Supervising physician immediately available to respond to emergencies [name]   Physician(s) Dr. Edd Fabian & Marcelene Butte   Medication changes reported     No   Fall or balance concerns reported    No   Warm-up and Cool-down Performed on first and last piece of equipment   Resistance Training Performed Yes   VAD Patient? No     Pain Assessment   Currently in Pain? No/denies         Goals Met:  Proper associated with RPD/PD & O2 Sat Independence with exercise equipment Exercise tolerated well Strength training completed today  Goals Unmet:  Not Applicable  Comments: Pt able to follow exercise prescription today without complaint.  Will continue to monitor for progression.    Dr. Emily Filbert is Medical Director for [place] and [name].

## 2016-08-17 ENCOUNTER — Encounter: Payer: Medicare Other | Attending: Internal Medicine

## 2016-08-17 ENCOUNTER — Encounter: Payer: Self-pay | Admitting: Respiratory Therapy

## 2016-08-17 DIAGNOSIS — J449 Chronic obstructive pulmonary disease, unspecified: Secondary | ICD-10-CM | POA: Insufficient documentation

## 2016-08-17 NOTE — Progress Notes (Signed)
Pulmonary Individual Treatment Plan  Patient Details  Name: Lori Brady MRN: [id number] Date of Birth: 05/30/1946 Referring Provider:   Flowsheet Row Pulmonary Rehab from 05/25/2016 in [facility] Cardiac and Pulmonary Rehab  Referring Provider  [name], MD.      Initial Encounter Date:  Flowsheet Row Pulmonary Rehab from 05/25/2016 in [facility] Cardiac and Pulmonary Rehab  Date  05/25/16  Referring Provider  [name], MD.      Visit Diagnosis: COPD, mild ([place])  Patient's Home Medications on Admission:  Current Outpatient Prescriptions:    albuterol (ACCUNEB) 0.63 MG/3ML nebulizer solution, Take 1 ampule by nebulization every 6 (six) hours as needed for wheezing., Disp: , Rfl:    alendronate (FOSAMAX) 70 MG tablet, Take 1 tablet (70 mg total) by mouth once a week., Disp: 12 tablet, Rfl: 4   aspirin 81 MG tablet, Take 81 mg by mouth daily., Disp: , Rfl:    betamethasone valerate (VALISONE) 0.1 % cream, APPLY TOPICALLY TO AFFECTED AREA SPARINGLY 2 TIMES DAILY., Disp: 15 g, Rfl: 5   chlorthalidone (HYGROTON) 25 MG tablet, Take 25 mg by mouth daily., Disp: , Rfl:    Cholecalciferol (VITAMIN D) 2000 UNITS CAPS, Take 1 capsule by mouth daily., Disp: , Rfl:    citalopram (CELEXA) 20 MG tablet, TAKE 1 TABLET BY MOUTH ONCE DAILY., Disp: 90 tablet, Rfl: 1   doxepin (SINEQUAN) 25 MG capsule, Take 25 mg by mouth at bedtime. , Disp: , Rfl:    fluticasone (FLONASE) 50 MCG/ACT nasal spray, USE 2 SPRAYS IN EACH NOSTRIL ONCE DAILY., Disp: 16 g, Rfl: 11   Fluticasone Furoate-Vilanterol (BREO ELLIPTA) 100-25 MCG/INH AEPB, Inhale 1 puff into the lungs daily. , Disp: , Rfl:    lisinopril (PRINIVIL,ZESTRIL) 20 MG tablet, , Disp: , Rfl:    LORazepam (ATIVAN) 0.5 MG tablet, Take 1 tablet (0.5 mg total) by mouth every 8 (eight) hours as needed. for anxiety, Disp: 90 tablet, Rfl: 5   montelukast (SINGULAIR) 10 MG tablet, Take 1 tablet (10 mg total) by mouth daily., Disp: 90 tablet, Rfl: 1   Omega-3  Fatty Acids (FISH OIL) 1000 MG CAPS, Take 1 capsule by mouth daily., Disp: , Rfl:    omeprazole (PRILOSEC) 20 MG capsule, Take 20 mg by mouth daily., Disp: , Rfl:    omeprazole (PRILOSEC) 40 MG capsule, Reported on 05/28/2016, Disp: , Rfl:    potassium chloride SA (K-DUR,KLOR-CON) 20 MEQ tablet, TAKE 1 TABLET BY MOUTH TWICE DAILY (Patient taking differently: TAKE 1 TABLET BY MOUTH DAILY), Disp: 60 tablet, Rfl: 5   PROVENTIL HFA 108 (90 Base) MCG/ACT inhaler, Inhale 1-2 puffs into the lungs every 4 (four) hours as needed. , Disp: , Rfl:    simvastatin (ZOCOR) 40 MG tablet, TAKE 1 TABLET BY MOUTH AT BEDTIME., Disp: 90 tablet, Rfl: 3   tiotropium (SPIRIVA) 18 MCG inhalation capsule, Place 1 capsule (18 mcg total) into inhaler and inhale daily., Disp: 90 capsule, Rfl: 1   triamcinolone cream (KENALOG) 0.1 %, , Disp: , Rfl:   Past Medical History: Past Medical History:  Diagnosis Date   Arthritis    Asthma    Crohn's disease ([place])    Emphysema of lung ([place])    GERD (gastroesophageal reflux disease)    History of chicken pox    History of heart attack    Hx of completed stroke    Hypercholesteremia    Hypertension    Seasonal allergies    UTI (lower urinary tract infection)  Tobacco Use: History  Smoking Status   Former Smoker   Packs/day: 1.00   Years: 30.00   Types: Cigarettes   Quit date: 08/01/2008  Smokeless Tobacco   Never Used    Labs: Recent Review Flowsheet Data    Labs for ITP Cardiac and Pulmonary Rehab Latest Ref Rng & Units 12/07/2011 04/02/2016   Cholestrol 100 - 199 mg/dL 133 141   LDLCALC 0 - 99 mg/dL 63 78   HDL >39 mg/dL 56 32(L)   Trlycerides 0 - 149 mg/dL 71 155(H)       ADL UCSD:     Pulmonary Assessment Scores    Row Name 05/25/16 1638         ADL UCSD   ADL Phase Entry     SOB Score total 56     Rest 0     Walk 3     Stairs 5     Bath 0     Dress 0     Shop 5        Pulmonary Function Assessment:      Pulmonary Function Assessment - 05/25/16 1634      Pulmonary Function Tests   RV% 67 %   DLCO% 51 %     Initial Spirometry Results   FVC% 68 %   FEV1% 60 %   FEV1/FVC Ratio 70     Post Bronchodilator Spirometry Results   FVC% 75 %   FEV1% 68 %   FEV1/FVC Ratio 72     Breath   Bilateral Breath Sounds Clear   Shortness of Breath No      Exercise Target Goals:    Exercise Program Goal: Individual exercise prescription set with THRR, safety & activity barriers. Participant demonstrates ability to understand and report RPE using BORG scale, to self-measure pulse accurately, and to acknowledge the importance of the exercise prescription.  Exercise Prescription Goal: Starting with aerobic activity 30 plus minutes a day, 3 days per week for initial exercise prescription. Provide home exercise prescription and guidelines that participant acknowledges understanding prior to discharge.  Activity Barriers & Risk Stratification:   6 Minute Walk:     6 Minute Walk    Row Name 05/25/16 1539 07/15/16 1145       6 Minute Walk   Phase Initial Mid Program    Distance 945 feet 1125 feet    Distance % Change  -- 19 %    Walk Time 6 minutes 6 minutes    # of Rest Breaks 0 0    MPH 1.79 2.13    METS 2.37 3.2    RPE 11 14    Perceived Dyspnea  2 3    VO2 Peak 8.3 11.24    Symptoms No No    Resting HR 88 bpm 97 bpm    Resting BP 142/80 120/60    Max Ex. HR 115 bpm 126 bpm    Max Ex. BP 158/82 164/72    2 Minute Post BP 138/82  --      Interval HR   Baseline HR 88 97    1 Minute HR 100 107    2 Minute HR 111 117    3 Minute HR 115 121    4 Minute HR 115 122    5 Minute HR 113 126    6 Minute HR 114 120    2 Minute Post HR 95 111    Interval Heart Rate? Yes  --  Interval Oxygen   Interval Oxygen? Yes  --    Baseline Oxygen Saturation % 96 % 97 %    Baseline Liters of Oxygen 0 L  --    1 Minute Oxygen Saturation % 94 % 92 %    1 Minute Liters of Oxygen 0 L  --     2 Minute Oxygen Saturation % 94 % 94 %    2 Minute Liters of Oxygen 0 L  --    3 Minute Oxygen Saturation % 94 % 96 %    3 Minute Liters of Oxygen 0 L  --    4 Minute Oxygen Saturation % 94 % 95 %    4 Minute Liters of Oxygen 0 L  --    5 Minute Oxygen Saturation % 95 % 95 %    5 Minute Liters of Oxygen 0 L  --    6 Minute Oxygen Saturation % 95 % 94 %    6 Minute Liters of Oxygen 0 L  --    2 Minute Post Oxygen Saturation % 96 % 96 %    2 Minute Post Liters of Oxygen 0 L  --       Initial Exercise Prescription:     Initial Exercise Prescription - 05/25/16 1500      Date of Initial Exercise RX and Referring Provider   Date 05/25/16   Referring Provider [name], MD.     Treadmill   MPH 1   Grade 0   Minutes 5   METs 2     Recumbant Bike   Level 2   RPM 50   Minutes 15   METs 2     NuStep   Level 2   Minutes 15   METs 2.5     Biostep-RELP   Level 2   Minutes 15   METs 2.5     Prescription Details   Frequency (times per week) 3   Duration Progress to 30 minutes of continuous aerobic without signs/symptoms of physical distress     Intensity   THRR 40-80% of Max Heartrate 113-140   Ratings of Perceived Exertion 11-13   Perceived Dyspnea 0-4     Progression   Progression Continue progressive overload as per policy without signs/symptoms or physical distress.     Resistance Training   Training Prescription Yes   Weight 2   Reps 10-12      Perform Capillary Blood Glucose checks as needed.  Exercise Prescription Changes:     Exercise Prescription Changes    Row Name 05/25/16 1500 06/08/16 1100 06/17/16 1400 06/30/16 1400 07/01/16 1400     Exercise Review   Progression  --  -- Yes Yes Yes     Response to Exercise   Blood Pressure (Admit) 142/80 124/72 132/70 128/70  --   Blood Pressure (Exercise) 158/82 162/80 134/64 134/60  --   Blood Pressure (Exit) 138/68 130/70 126/64 108/60  --   Heart Rate (Admit) 88 bpm 110 bpm 98 bpm 98 bpm  --    Heart Rate (Exercise) 114 bpm 128 bpm 110 bpm 118 bpm  --   Heart Rate (Exit) 90 bpm 103 bpm 94 bpm 91 bpm  --   Oxygen Saturation (Admit) 96 % 97 % 97 % 99 %  --   Oxygen Saturation (Exercise) 95 % 94 % 94 % 95 %  --   Oxygen Saturation (Exit) 97 % 94 % 94 % 97 %  --  Rating of Perceived Exertion (Exercise) 11 12 14 14   --   Perceived Dyspnea (Exercise)  -- 3 2 2   --   Symptoms  -- none none none none   Comments  --  --  --  -- Home Exercise Guidelines given 07/01/16   Duration  -- Progress to 30 minutes of continuous aerobic without signs/symptoms of physical distress Progress to 30 minutes of continuous aerobic without signs/symptoms of physical distress Progress to 45 minutes of aerobic exercise without signs/symptoms of physical distress Progress to 45 minutes of aerobic exercise without signs/symptoms of physical distress   Intensity  -- THRR unchanged  113-140 THRR unchanged THRR unchanged THRR unchanged     Progression   Progression  -- Continue progressive overload as per policy without signs/symptoms or physical distress. Continue progressive overload as per policy without signs/symptoms or physical distress. Continue to progress workloads to maintain intensity without signs/symptoms of physical distress. Continue to progress workloads to maintain intensity without signs/symptoms of physical distress.   Average METs  -- 1.9 1.92 1.97 1.97     Resistance Training   Training Prescription  -- Yes Yes Yes Yes   Weight  -- 2 2 2  lbs 2 lbs   Reps  -- 10-12 10-12 10-15 10-15     Interval Training   Interval Training  --  -- No No No     Treadmill   MPH  -- 1 1 1.3 1.3   Grade  -- 0 0 0 0   Minutes  -- 5 8 11 11    METs  -- 2 1.77 2 2     Recumbant Bike   Level  -- 2 2 3 3    RPM  -- 50 35 35 35   Minutes  -- 15 20 15 15    METs  -- 2 2.2 2.1 2.1     NuStep   Level  -- 2 2 2 2    Minutes  -- 15 15 15 15    METs  -- 2 1.8 1.8 1.8     Home Exercise Plan   Plans to continue  exercise at  --  --  --  -- Home  Walking with plans to join [employer] after graduation   Frequency  --  --  --  -- Add 1 additional day to program exercise sessions.   [place] Name 07/15/16 1400 07/28/16 1400 08/12/16 1200         Exercise Review   Progression Yes Yes Yes       Response to Exercise   Blood Pressure (Admit) 120/60 114/74 120/68     Blood Pressure (Exercise) 132/50 122/62 128/64     Blood Pressure (Exit) 120/64 96/54 110/60     Heart Rate (Admit) 94 bpm 102 bpm 106 bpm     Heart Rate (Exercise) 124 bpm 115 bpm 125 bpm     Heart Rate (Exit) 86 bpm 85 bpm 97 bpm     Oxygen Saturation (Admit) 99 % 98 % 98 %     Oxygen Saturation (Exercise) 94 % 97 % 93 %     Oxygen Saturation (Exit) 95 % 96 % 96 %     Rating of Perceived Exertion (Exercise) 13 15 15      Perceived Dyspnea (Exercise) 2 2 2      Symptoms none none none     Comments Home Exercise Guidelines given 07/01/16 Home Exercise Guidelines given 07/01/16  --     Duration Progress to 45 minutes  of aerobic exercise without signs/symptoms of physical distress Progress to 45 minutes of aerobic exercise without signs/symptoms of physical distress Progress to 45 minutes of aerobic exercise without signs/symptoms of physical distress     Intensity THRR unchanged THRR unchanged THRR unchanged       Progression   Progression Continue to progress workloads to maintain intensity without signs/symptoms of physical distress. Continue to progress workloads to maintain intensity without signs/symptoms of physical distress. Continue to progress workloads to maintain intensity without signs/symptoms of physical distress.     Average METs 2.25 2.27 2.5       Resistance Training   Training Prescription Yes Yes Yes     Weight 2 lbs 2 lbs 4     Reps 10-15 10-15 10-15       Interval Training   Interval Training No No No       Treadmill   MPH 1.5 1.7 1.8     Grade 1 1 1      Minutes 15 15 15      METs  -- 2.42 2.6       Recumbant  Bike   Level 4 4 4      RPM 37 35 36     Minutes 15 15 15      METs 2.3 2.3 2.4       NuStep   Level 4 4 4      Minutes 15 15 15      METs 2.1 2.1  --       Home Exercise Plan   Plans to continue exercise at Home  Walking with plans to join [employer] after graduation Home  Walking with plans to join [employer] after graduation  --     Frequency Add 1 additional day to program exercise sessions. Add 2 additional days to program exercise sessions.  --        Exercise Comments:     Exercise Comments    Row Name 06/08/16 1121 06/17/16 1419 06/30/16 1452 07/01/16 1117 07/15/16 1452   Exercise Comments First full day of exercise!  Patient was oriented to gym and equipment including functions, settings, policies, and procedures.  Patient's individual exercise prescription and treatment plan were reviewed.  All starting workloads were established based on the results of the 6 minute walk test done at initial orientation visit.  The plan for exercise progression was also introduced and progression will be customized based on patient's performance and goals. Lizania is off to a good start with exercise.  She is already starting to see some improvements.  We will continue to monitor for progression. Shellyann is doing very well.  She is already up to 1.3 mph and 11 min on the treadmill.  We will continue to montior her progression.  Reviewed home exercise with pt today.  Pt plans to walk at home for exercise.  Reviewed THR, pulse, RPE, sign and symptoms, and when to call 911 or MD.  Also discussed weather considerations and indoor options.  Once she graduates, Jashley plans to join [employer].  Pt voiced understanding. Mckynleigh continues to do well with exercise.  She is up to 1.5 mph on the treadmill and level 4 on both the recumbent bike and stepper.  All for a full 15 minutes!!!  We will continue to monitor for progression.   [place] Name 07/28/16 1439 08/12/16 1223         Exercise Comments Shanautica is  making good progress in her exercise.  She is now up  to 1.7 mph on the treadmill for 15 min!  We will continue to monitor her progress. Ayomide is progressing well with exercise.         Discharge Exercise Prescription (Final Exercise Prescription Changes):     Exercise Prescription Changes - 08/12/16 1200      Exercise Review   Progression Yes     Response to Exercise   Blood Pressure (Admit) 120/68   Blood Pressure (Exercise) 128/64   Blood Pressure (Exit) 110/60   Heart Rate (Admit) 106 bpm   Heart Rate (Exercise) 125 bpm   Heart Rate (Exit) 97 bpm   Oxygen Saturation (Admit) 98 %   Oxygen Saturation (Exercise) 93 %   Oxygen Saturation (Exit) 96 %   Rating of Perceived Exertion (Exercise) 15   Perceived Dyspnea (Exercise) 2   Symptoms none   Duration Progress to 45 minutes of aerobic exercise without signs/symptoms of physical distress   Intensity THRR unchanged     Progression   Progression Continue to progress workloads to maintain intensity without signs/symptoms of physical distress.   Average METs 2.5     Resistance Training   Training Prescription Yes   Weight 4   Reps 10-15     Interval Training   Interval Training No     Treadmill   MPH 1.8   Grade 1   Minutes 15   METs 2.6     Recumbant Bike   Level 4   RPM 36   Minutes 15   METs 2.4     NuStep   Level 4   Minutes 15       Nutrition:  Target Goals: Understanding of nutrition guidelines, daily intake of sodium <1572m, cholesterol <2065m calories 30% from fat and 7% or less from saturated fats, daily to have 5 or more servings of fruits and vegetables.  Biometrics:     Pre Biometrics - 05/25/16 1545      Pre Biometrics   Height 5' 3"  (1.6 m)   Weight 153 lb 4.8 oz (69.5 kg)   Waist Circumference 36.5 inches   Hip Circumference 41 inches   Waist to Hip Ratio 0.89 %   BMI (Calculated) 27.2       Nutrition Therapy Plan and Nutrition Goals:     Nutrition Therapy & Goals - 05/25/16  1627      Intervention Plan   Intervention Prescribe, educate and counsel regarding individualized specific dietary modifications aiming towards targeted core components such as weight, hypertension, lipid management, diabetes, heart failure and other comorbidities.   Expected Outcomes Short Term Goal: Understand basic principles of dietary content, such as calories, fat, sodium, cholesterol and nutrients.;Short Term Goal: A plan has been developed with personal nutrition goals set during dietitian appointment.;Long Term Goal: Adherence to prescribed nutrition plan.      Nutrition Discharge: Rate Your Plate Scores:   Psychosocial: Target Goals: Acknowledge presence or absence of depression, maximize coping skills, provide positive support system. Participant is able to verbalize types and ability to use techniques and skills needed for reducing stress and depression.  Initial Review & Psychosocial Screening:     Initial Psych Review & Screening - 05/25/16 1630      Initial Review   Current issues with History of Depression;Current Psychotropic Meds     Family Dynamics   Good Support System? Yes  Has a very large family unit for support.  Husband passed away in 20March 05, 2005   Barriers   Psychosocial barriers to  participate in program There are no identifiable barriers or psychosocial needs.;The patient should benefit from training in stress management and relaxation.     Screening Interventions   Interventions Encouraged to exercise      Quality of Life Scores:     Quality of Life - 05/25/16 1645      Quality of Life Scores   Health/Function Pre 18.13 %   Socioeconomic Pre 20 %   Psych/Spiritual Pre 19.67 %   Family Pre 20.25 %   GLOBAL Pre 19.09 %      PHQ-9: Recent Review Flowsheet Data    Depression screen [facility] 2/9 05/25/2016 03/31/2016   Decreased Interest 2 0   Down, Depressed, Hopeless 0 0   PHQ - 2 Score 2 0   Altered sleeping 0 -   Tired, decreased energy 1 -    Change in appetite 1 -   Feeling bad or failure about yourself  0 -   Trouble concentrating 0 -   Moving slowly or fidgety/restless 0 -   Suicidal thoughts 0 -   PHQ-9 Score 4 -   Difficult doing work/chores Somewhat difficult  -      Psychosocial Evaluation and Intervention:     Psychosocial Evaluation - 06/10/16 January 28, 1108      Psychosocial Evaluation & Interventions   Interventions Encouraged to exercise with the program and follow exercise prescription   Comments Counselor met with Ms. Mcgahee today for initial psychosocial evaluation.   She is a 70 yr old who has suffered with COPD for almost 6 years.  She has a strong support system with sisters, adult children and grandchildren who live close by.  Ms. Takeshita spouse passed away in 01-28-2004.  She had a stroke in 2006/01/27 and a mild heart attack in 01-28-2008.  Ms Cuyler reports sleeping well and takes ativan as needed to help with this.  She states her appetite is good as well.  Ms. Cammarano reports a history of depression and anxiety and states her medications are managing any current symptoms.  Ms. Ciccone is generally in a positive mood and announced she learned that she is expecting her first S[place] and was very excited about this.  Ms Proffit has goals to resume her normal activities of gardening, shopping and traveling.  She would like to feel better as well with increased stamina and strength.  She plans to join a gym after she completes this program in order to maintain consistency in exercising.  Counselor will follow with Ms. Westervelt throughout the course of this program.        Psychosocial Re-Evaluation:     Psychosocial Re-Evaluation    [place] Name 07/01/16 1035 07/27/16 1109           Psychosocial Re-Evaluation   Comments Counselor met with [name] today for follow up.  She reports progress in the areas of walking better; doing more normal activities; able to shop and get out more and improved mood since coming into this program.  she  continues to struggle somewhat with sleep, but reports getting at least 6 hours/night most nights.  Counselor commended Ms. Jeremiah on her hard work and progress made.   Follow up with Ms. Fleek today reporting continued progress in this program with ability to go up and down steps with greater ease.  She states her family has noticed her improvements as well.  Ms. Mccully also reported that her younger brother died suddenly this past Feb 01, 2023.  Counselor offered condolences  and asked Ms. Cella how she is coping with this.  She reports her family support system has been very helpful during this time, and that has made this loss easier.  Counselor will continue to provide support for Ms. Sonnenberg during the course of this program.          Education: Education Goals: Education classes will be provided on a weekly basis, covering required topics. Participant will state understanding/return demonstration of topics presented.  Learning Barriers/Preferences:     Learning Barriers/Preferences - 05/25/16 1631      Learning Barriers/Preferences   Learning Barriers Hearing  Deaf in Right ear since Stroke in 2007   Learning Preferences None      Education Topics: Initial Evaluation Education: - Verbal, written and demonstration of respiratory meds, RPE/PD scales, oximetry and breathing techniques. Instruction on use of nebulizers and MDIs: cleaning and proper use, rinsing mouth with steroid doses and importance of monitoring MDI activations. Flowsheet Row Pulmonary Rehab from 08/05/2016 in [facility] Cardiac and Pulmonary Rehab  Date  05/25/16  Educator  sb  Instruction Review Code  2- meets goals/outcomes      General Nutrition Guidelines/Fats and Fiber: -Group instruction provided by verbal, written material, models and posters to present the general guidelines for heart healthy nutrition. Gives an explanation and review of dietary fats and fiber. Flowsheet Row Pulmonary Rehab from 08/05/2016 in [facility] Cardiac  and Pulmonary Rehab  Date  06/08/16  Educator  CR  Instruction Review Code  2- meets goals/outcomes      Controlling Sodium/Reading Food Labels: -Group verbal and written material supporting the discussion of sodium use in heart healthy nutrition. Review and explanation with models, verbal and written materials for utilization of the food label. Flowsheet Row Pulmonary Rehab from 08/05/2016 in [facility] Cardiac and Pulmonary Rehab  Date  06/15/16  Educator  CR  Instruction Review Code  2- meets goals/outcomes      Exercise Physiology & Risk Factors: - Group verbal and written instruction with models to review the exercise physiology of the cardiovascular system and associated critical values. Details cardiovascular disease risk factors and the goals associated with each risk factor. Flowsheet Row Pulmonary Rehab from 08/05/2016 in [facility] Cardiac and Pulmonary Rehab  Date  07/08/16  Educator  AS  Instruction Review Code  2- meets goals/outcomes      Aerobic Exercise & Resistance Training: - Gives group verbal and written discussion on the health impact of inactivity. On the components of aerobic and resistive training programs and the benefits of this training and how to safely progress through these programs. Flowsheet Row Pulmonary Rehab from 08/05/2016 in [facility] Cardiac and Pulmonary Rehab  Date  08/05/16  Educator  Alberteen Sam  Instruction Review Code  2- meets goals/outcomes      Flexibility, Balance, General Exercise Guidelines: - Provides group verbal and written instruction on the benefits of flexibility and balance training programs. Provides general exercise guidelines with specific guidelines to those with heart or lung disease. Demonstration and skill practice provided. Flowsheet Row Pulmonary Rehab from 08/05/2016 in [facility] Cardiac and Pulmonary Rehab  Date  06/10/16  Educator  AS  Instruction Review Code  2- meets goals/outcomes      Stress Management: - Provides group  verbal and written instruction about the health risks of elevated stress, cause of high stress, and healthy ways to reduce stress.   Depression: - Provides group verbal and written instruction on the correlation between heart/lung disease and depressed mood, treatment options, and  the stigmas associated with seeking treatment.   Exercise & Equipment Safety: - Individual verbal instruction and demonstration of equipment use and safety with use of the equipment. Flowsheet Row Pulmonary Rehab from 08/05/2016 in [facility] Cardiac and Pulmonary Rehab  Date  06/08/16  Educator  [facility]  Instruction Review Code  2- meets goals/outcomes      Infection Prevention: - Provides verbal and written material to individual with discussion of infection control including proper hand washing and proper equipment cleaning during exercise session. Flowsheet Row Pulmonary Rehab from 08/05/2016 in [facility] Rehab  Date  06/08/16  Educator  [facility]  Instruction Review Code  2- meets goals/outcomes      Falls Prevention: - Provides verbal and written material to individual with discussion of falls prevention and safety. Flowsheet Row Pulmonary Rehab from 08/05/2016 in [facility] Cardiac and Pulmonary Rehab  Date  05/25/16  Educator  [facility]  Instruction Review Code  2- meets goals/outcomes      Diabetes: - Individual verbal and written instruction to review signs/symptoms of diabetes, desired ranges of glucose level fasting, after meals and with exercise. Advice that pre and post exercise glucose checks will be done for 3 sessions at entry of program.   Chronic Lung Diseases: - Group verbal and written instruction to review new updates, new respiratory medications, new advancements in procedures and treatments. Provide informative websites and "800" numbers of self-education.   Lung Procedures: - Group verbal and written instruction to describe testing methods done to diagnose lung disease. Review the outcome  of test results. Describe the treatment choices: Pulmonary Function Tests, ABGs and oximetry.   Energy Conservation: - Provide group verbal and written instruction for methods to conserve energy, plan and organize activities. Instruct on pacing techniques, use of adaptive equipment and posture/positioning to relieve shortness of breath.   Triggers: - Group verbal and written instruction to review types of environmental controls: home humidity, furnaces, filters, dust mite/pet prevention, HEPA vacuums. To discuss weather changes, air quality and the benefits of nasal washing. Flowsheet Row Pulmonary Rehab from 08/05/2016 in [facility] Cardiac and Pulmonary Rehab  Date  07/27/16  Educator  LB  Instruction Review Code  2- meets goals/outcomes      Exacerbations: - Group verbal and written instruction to provide: warning signs, infection symptoms, calling MD promptly, preventive modes, and value of vaccinations. Review: effective airway clearance, coughing and/or vibration techniques. Create an Sports administrator. Flowsheet Row Pulmonary Rehab from 08/05/2016 in [facility] Cardiac and Pulmonary Rehab  Date  06/29/16  Educator  LB  Instruction Review Code  2- meets goals/outcomes      Oxygen: - Individual and group verbal and written instruction on oxygen therapy. Includes supplement oxygen, available portable oxygen systems, continuous and intermittent flow rates, oxygen safety, concentrators, and Medicare reimbursement for oxygen.   Respiratory Medications: - Group verbal and written instruction to review medications for lung disease. Drug class, frequency, complications, importance of spacers, rinsing mouth after steroid MDI's, and proper cleaning methods for nebulizers.   AED/CPR: - Group verbal and written instruction with the use of models to demonstrate the basic use of the AED with the basic ABC's of resuscitation. Flowsheet Row Pulmonary Rehab from 08/05/2016 in [facility] Cardiac and Pulmonary Rehab   Date  06/19/16  Educator  CE  Instruction Review Code  2- meets goals/outcomes      Breathing Retraining: - Provides individuals verbal and written instruction on purpose, frequency, and proper technique of diaphragmatic breathing and pursed-lipped breathing. Applies  individual practice skills. Flowsheet Row Pulmonary Rehab from 08/05/2016 in [facility] Cardiac and Pulmonary Rehab  Date  06/08/16  Educator  [facility]  Instruction Review Code  2- meets goals/outcomes      Anatomy and Physiology of the Lungs: - Group verbal and written instruction with the use of models to provide basic lung anatomy and physiology related to function, structure and complications of lung disease. Flowsheet Row Pulmonary Rehab from 08/05/2016 in [facility] Cardiac and Pulmonary Rehab  Date  07/10/16  Educator  [place]  Instruction Review Code  2- meets goals/outcomes      Heart Failure: - Group verbal and written instruction on the basics of heart failure: signs/symptoms, treatments, explanation of ejection fraction, enlarged heart and cardiomyopathy. Flowsheet Row Pulmonary Rehab from 08/05/2016 in [facility] Cardiac and Pulmonary Rehab  Date  07/03/16  Educator  CE [Know your numbers]  Instruction Review Code  2- meets goals/outcomes      Sleep Apnea: - Individual verbal and written instruction to review Obstructive Sleep Apnea. Review of risk factors, methods for diagnosing and types of masks and machines for OSA.   Anxiety: - Provides group, verbal and written instruction on the correlation between heart/lung disease and anxiety, treatment options, and management of anxiety.   Relaxation: - Provides group, verbal and written instruction about the benefits of relaxation for patients with heart/lung disease. Also provides patients with examples of relaxation techniques. Flowsheet Row Pulmonary Rehab from 08/05/2016 in [facility] Cardiac and Pulmonary Rehab  Date  06/24/16  Educator  [facility]  Instruction Review Code  2- Meets  goals/outcomes      Knowledge Questionnaire Score:     Knowledge Questionnaire Score - 05/25/16 1632      Knowledge Questionnaire Score   Pre Score 6/10       Core Components/Risk Factors/Patient Goals at Admission:     Personal Goals and Risk Factors at Admission - 05/25/16 1628      Core Components/Risk Factors/Patient Goals on Admission    Weight Management Yes;Obesity   Admit Weight 153 lb 4.8 oz (69.5 kg)   Goal Weight: Short Term 145 lb (65.8 kg)   Goal Weight: Long Term 130 lb (59 kg)   Expected Outcomes Short Term: Continue to assess and modify interventions until short term weight is achieved;Weight Loss: Understanding of general recommendations for a balanced deficit meal plan, which promotes 1-2 lb weight loss per week and includes a negative energy balance of [phone] kcal/d   Increase Strength and Stamina Yes   Intervention Provide advice, education, support and counseling about physical activity/exercise needs.;Develop an individualized exercise prescription for aerobic and resistive training based on initial evaluation findings, risk stratification, comorbidities and participant's personal goals.   Expected Outcomes Achievement of increased cardiorespiratory fitness and enhanced flexibility, muscular endurance and strength shown through measurements of functional capacity and personal statement of participant.   Improve shortness of breath with ADL's Yes   Intervention Provide education, individualized exercise plan and daily activity instruction to help decrease symptoms of SOB with activities of daily living.   Expected Outcomes Short Term: Achieves a reduction of symptoms when performing activities of daily living.   Develop more efficient breathing techniques such as purse lipped breathing and diaphragmatic breathing; and practicing self-pacing with activity Yes  HAs several inhalers. Stated understanding of use and need to rinse with steriod inhaler.    Intervention Provide education, demonstration and support about specific breathing techniuqes utilized for more efficient breathing. Include techniques such as pursed lipped breathing, diaphragmatic  breathing and self-pacing activity.   Expected Outcomes Short Term: Participant will be able to demonstrate and use breathing techniques as needed throughout daily activities.   Hypertension Yes   Intervention Provide education on lifestyle modifcations including regular physical activity/exercise, weight management, moderate sodium restriction and increased consumption of fresh fruit, vegetables, and low fat dairy, alcohol moderation, and smoking cessation.;Monitor prescription use compliance.   Expected Outcomes Short Term: Continued assessment and intervention until BP is < 140/45m HG in hypertensive participants. < 130/89mHG in hypertensive participants with diabetes, heart failure or chronic kidney disease.;Long Term: Maintenance of blood pressure at goal levels.   Lipids Yes   Intervention Provide education and support for participant on nutrition & aerobic/resistive exercise along with prescribed medications to achieve LDL <7013mHDL >42m38m Expected Outcomes Short Term: Participant states understanding of desired cholesterol values and is compliant with medications prescribed. Participant is following exercise prescription and nutrition guidelines.;Long Term: Cholesterol controlled with medications as prescribed, with individualized exercise RX and with personalized nutrition plan. Value goals: LDL < 70mg83mL > 40 mg.      Core Components/Risk Factors/Patient Goals Review:      Goals and Risk Factor Review    Row Name 06/08/16 1122 06/10/16 1306 06/12/16 1458 06/12/16 1504 06/15/16 1040     Core Components/Risk Factors/Patient Goals Review   Personal Goals Review Develop more efficient breathing techniques such as purse lipped breathing and diaphragmatic breathing and practicing self-pacing  with activity. Weight Management/Obesity;Hypertension Increase knowledge of respiratory medications and ability to use respiratory devices properly. Improve shortness of breath with ADL's Sedentary;Increase Strength and Stamina   Review Pursed lip breathing techniques were reviewed with the patient. She demonstrated understanding of pursed lip breathing and was able to use this breathing technique during her exercise today to help control shortness of breath. [facility] about weight and blood pressure risk factors.  Today is her 2nd session, so I reviewed the basics of keeping control with her medication, nutrition and exercise. She stated she was not sure if she wanted to meet with the RD. Explained, that the RD evaluation can help balance her nutrition n plan. control.   Reviewed Mr. ClarkFinchumcations with her today.  She knows when and how to take them.  She also has an aerochamber to use with her MDI's. Ms. ClarkNouri that she already feels stronger after her first week of exercise.   DorisEvyff to a good start in rehab.  She is already feeling stronger and seems to have an increase in stamina.  She is doing the weights and stretches at home!   Expected Outcomes Patient will continue to use pursed lip breathing techniques to control SOB during exercise in order to be able to continue to progress and increase in strength and stamina. Continued exercise, nutrion and medication use to help with Risk Factor control.  Obtain appoinment with thte RD to see if there are any nutrition changes she can make to help with weight control.  This knowledge should help keep inflamation and excerbations to a minimum . Continuing to exercise should help decrease her SOB and increase her ability to preform ADLs.  DorisLeeyah continue to come to exercise and education.  We will discuss a home exercise program soon.   Row [place] 07/03/16 1353 07/03/16 1359 07/03/16 1401 07/06/16 1043 08/05/16 1258     Core  Components/Risk Factors/Patient Goals Review   Personal Goals Review Increase knowledge of respiratory medications and  ability to use respiratory devices properly. Develop more efficient breathing techniques such as purse lipped breathing and diaphragmatic breathing and practicing self-pacing with activity. Improve shortness of breath with ADL's Sedentary;Increase Strength and Stamina Weight Management/Obesity;Hypertension;Increase Strength and Stamina;Sedentary   Review Ms. Balkcom takes Proventil, Spiriva, Breo Ellipta, and Albuterol for her respiratory issues.  She has been taking them for a while and knows the proper techiques on how to take all the medications. She has a spacer at home for the Proventil.  She says that the Memory Dance has helped to most with her SOB since she started taking it.  Ms. Low show proper technique for PLB when she is SOB and exercising. Ms. Sadowski is having less SOB and is finding it easier to walk longer distances and when taking the stairs.  She is having less shuffling of her feet and less stumbling. Ms. Manocchio plans to start some home exercise soon, but has already been consistantly coming to class. She reports walking better with more confidence. She also stated that her strength and stamina are imporving.  Suhani's weight has been steady and her blood pressures were good too.  She feels like exercise has "been like a miracle" as she is feeling so much better with her strength and stamina.  She is able to do more around the house.  She is also walking 2 days a week at home for 30 min!!   Expected Outcomes If Lillyauna continues to take her medicatons as scheduled and when needed, this should help decrease exacerbations and SOB for her.  The technique should help her recover from SOB quicker and help her if she is having anxiety or exacerbations.  Continued exercise should help Ms. Sattar increase her muscle strength and lung health as she progresses in the program.  Ms. Zumstein will soon  begin adding home exercise to her routine which will increase her frequency of exercise and continue to aid in strength and stamina improvments.  Jamileth will continue to come to exercise and education classses to work on strength and stamina and risk factor modification.  We will continue to monitor her progression.   [place] Name 08/07/16 1502 08/07/16 1504 08/07/16 1506         Core Components/Risk Factors/Patient Goals Review   Personal Goals Review Increase knowledge of respiratory medications and ability to use respiratory devices properly. Improve shortness of breath with ADL's Develop more efficient breathing techniques such as purse lipped breathing and diaphragmatic breathing and practicing self-pacing with activity.     Review Ms. Navarrete takes Proventil, Spiriva, Breo and Albuterol SVNs at home.  She has a spacer for her MDIs.   Ms. Trigueros says that everything is easier for her to do since she has been exercising.  She that her muscles are stronger and she is shuffeling her feel less. Ms. Biggers demonstrates proper technique of PLB.     Expected Outcomes Taking her respiratory medication as prescribed by the doctor will help decrease her SOB and chances of exacerbations.  Continued strenght training and cardiovascular exercise will help improve Ms. Clarks SOB and stamina during ADLs.  PLB will help Ms. Mapes regain her breath during exercise.         Core Components/Risk Factors/Patient Goals at Discharge (Final Review):      Goals and Risk Factor Review - 08/07/16 1506      Core Components/Risk Factors/Patient Goals Review   Personal Goals Review Develop more efficient breathing techniques such as purse lipped breathing and diaphragmatic  breathing and practicing self-pacing with activity.   Review Ms. Solar demonstrates proper technique of PLB.   Expected Outcomes PLB will help Ms. Baptista regain her breath during exercise.       ITP Comments:     ITP Comments    Row Name 05/25/16 1605  08/12/16 1224         ITP Comments Medical Review completed today with ITP. Continue with ITP Chart documentation sent for [employer]. Epifania is progressing well with exercise.         Comments: 30 day note review

## 2016-08-21 ENCOUNTER — Encounter: Payer: Medicare Other | Admitting: *Deleted

## 2016-08-21 DIAGNOSIS — J449 Chronic obstructive pulmonary disease, unspecified: Secondary | ICD-10-CM

## 2016-08-21 NOTE — Progress Notes (Signed)
Daily Session Note  Patient Details  Name: Lori Brady MRN: [id number] Date of Birth: 01-05-1946 Referring Provider:   April Manson Pulmonary Rehab from 05/25/2016 in [facility] Cardiac and Pulmonary Rehab  Referring Provider  [name], MD.      Encounter Date: 08/21/2016  Check In:     Session Check In - 08/21/16 1038      Check-In   Location [facility]-Cardiac & Pulmonary Rehab   Staff Present Carson Myrtle, BS, RRT, Respiratory Therapist;Adonijah Baena, RN, Levie Heritage, MA, ACSM RCEP, Exercise Physiologist   Supervising physician immediately available to respond to emergencies [name]   Physician(s) Dr. Izola Price and Dr. Reita Cliche   Medication changes reported     No   Fall or balance concerns reported    No   Warm-up and Cool-down Performed as group-led instruction   Resistance Training Performed Yes   VAD Patient? No     Pain Assessment   Currently in Pain? No/denies         Goals Met:  Proper associated with RPD/PD & O2 Sat Exercise tolerated well  Goals Unmet:  Not Applicable  Comments:     Dr. Emily Filbert is Medical Director for [place] and [name].

## 2016-08-24 ENCOUNTER — Encounter: Payer: Medicare Other | Admitting: *Deleted

## 2016-08-24 VITALS — Ht 63.0 in | Wt 153.2 lb

## 2016-08-24 DIAGNOSIS — J449 Chronic obstructive pulmonary disease, unspecified: Secondary | ICD-10-CM

## 2016-08-24 NOTE — Progress Notes (Signed)
Daily Session Note  Patient Details  Name: Lori Brady MRN: [id number] Date of Birth: [birth year] Referring Provider:   April Brady Pulmonary Rehab from 05/25/2016 in [facility] Cardiac and Pulmonary Rehab  Referring Provider  Lori Bigness, MD.      Encounter Date: 08/24/2016  Check In:     Session Check In - 08/24/16 1010      Check-In   Location [facility]-Cardiac & Pulmonary Rehab   Staff Present Carson Myrtle, BS, RRT, Respiratory Therapist;Naiya Corral Amedeo Plenty, BS, ACSM CEP, Exercise Physiologist;Amanda Oletta Darter, BA, ACSM CEP, Exercise Physiologist   Supervising physician immediately available to respond to emergencies [name]   Physician(s) Dr. Burlene Arnt and Dr. Marcelene Butte   Medication changes reported     No   Fall or balance concerns reported    No   Warm-up and Cool-down Performed on first and last piece of equipment   Resistance Training Performed Yes   VAD Patient? No     Pain Assessment   Currently in Pain? No/denies   Multiple Pain Sites No         Goals Met:  Proper associated with RPD/PD & O2 Sat Independence with exercise equipment Exercise tolerated well Strength training completed today Personal goals and progress reviewed Goals Unmet:  Not Applicable  Comments: Pt able to follow exercise prescription today without complaint.  Will continue to monitor for progression.  6 min walk done today. See in min data sheet for detailed report on progress. Upon graduation Lori Brady plans to exercise at [employer].       [place] Name 05/25/16 1539 07/15/16 1145 08/24/16 1259     6 Minute Walk   Phase Initial Mid Program Discharge   Distance 945 feet 1125 feet 110 feet   Distance % Change  - 19 % 16 %   Walk Time 6 minutes 6 minutes 6 minutes   # of Rest Breaks 0 0 0   MPH 1.79 2.13 2.08   METS 2.37 3.2 3.16   RPE '11 14 13   ' Perceived Dyspnea  '2 3 2   ' VO2 Peak 8.3 11.24 11.05   Symptoms No No No   Resting HR 88 bpm 97 bpm 102 bpm    Resting BP 142/80 120/60 118/60   Max Ex. HR 115 bpm 126 bpm 124 bpm   Max Ex. BP 158/82 164/72 116/64   2 Minute Post BP 138/82  -  -     Interval HR   Baseline HR 88 97 102   1 Minute HR 100 107 115   2 Minute HR 111 117 120   3 Minute HR 115 121 122   4 Minute HR 115 122 124   5 Minute HR 113 126 123   6 Minute HR 114 120 124   2 Minute Post HR 95 111 101   Interval Heart Rate? Yes  - Yes     Interval Oxygen   Interval Oxygen? Yes  - Yes   Baseline Oxygen Saturation % 96 % 97 % 98 %   Baseline Liters of Oxygen 0 L  - 0 L   1 Minute Oxygen Saturation % 94 % 92 % 96 %   1 Minute Liters of Oxygen 0 L  - 0 L   2 Minute Oxygen Saturation % 94 % 94 % 96 %   2 Minute Liters of Oxygen 0 L  - 0 L   3 Minute Oxygen Saturation %  94 % 96 % 96 %   3 Minute Liters of Oxygen 0 L  - 0 L   4 Minute Oxygen Saturation % 94 % 95 % 96 %   4 Minute Liters of Oxygen 0 L  - 0 L   5 Minute Oxygen Saturation % 95 % 95 % 96 %   5 Minute Liters of Oxygen 0 L  - 0 L   6 Minute Oxygen Saturation % 95 % 94 % 96 %   6 Minute Liters of Oxygen 0 L  - 0 L   2 Minute Post Oxygen Saturation % 96 % 96 % 96 %   2 Minute Post Liters of Oxygen 0 L  - 0 L         Dr. Emily Filbert is Medical Director for [place] and [name].

## 2016-08-28 DIAGNOSIS — J449 Chronic obstructive pulmonary disease, unspecified: Secondary | ICD-10-CM | POA: Diagnosis not present

## 2016-08-28 NOTE — Progress Notes (Signed)
Daily Session Note  Patient Details  Name: DAZIYAH COGAN MRN: [id number] Date of Birth: 30-Jun-1946 Referring Provider:   April Manson Pulmonary Rehab from 05/25/2016 in [facility] Cardiac and Pulmonary Rehab  Referring Provider  [name], MD.      Encounter Date: 08/28/2016  Check In:     Session Check In - 08/28/16 1159      Check-In   Location [facility]-Cardiac & Pulmonary Rehab   Staff Present Heath Lark, RN, BSN, CCRP;Carroll Enterkin, RN, Vickki Hearing, BA, ACSM CEP, Exercise Physiologist   Supervising physician immediately available to respond to emergencies [name]   Physician(s) Corky Downs and Archie Balboa   Medication changes reported     No   Fall or balance concerns reported    No   Warm-up and Cool-down Performed as group-led Location manager Performed Yes   VAD Patient? No     Pain Assessment   Currently in Pain? No/denies   Multiple Pain Sites No         Goals Met:  Proper associated with RPD/PD & O2 Sat Independence with exercise equipment Exercise tolerated well Strength training completed today  Goals Unmet:  Not Applicable  Comments: Pt able to follow exercise prescription today without complaint.  Will continue to monitor for progression.    Dr. Emily Filbert is Medical Director for [place] and [name].

## 2016-08-31 ENCOUNTER — Encounter: Payer: Medicare Other | Admitting: *Deleted

## 2016-08-31 DIAGNOSIS — J449 Chronic obstructive pulmonary disease, unspecified: Secondary | ICD-10-CM | POA: Diagnosis not present

## 2016-08-31 NOTE — Progress Notes (Signed)
Daily Session Note  Patient Details  Name: Lori Brady MRN: [id number] Date of Birth: 10-14-1946 Referring Provider:   April Manson Pulmonary Rehab from 05/25/2016 in [facility] Cardiac and Pulmonary Rehab  Referring Provider  [name], MD.      Encounter Date: 08/31/2016  Check In:     Session Check In - 08/31/16 1012      Check-In   Location [facility]-Cardiac & Pulmonary Rehab   Staff Present Carson Myrtle, BS, RRT, Respiratory Therapist;Bradlee Bridgers Amedeo Plenty, BS, ACSM CEP, Exercise Physiologist;Amanda Oletta Darter, BA, ACSM CEP, Exercise Physiologist   Supervising physician immediately available to respond to emergencies [name]   Physician(s) Joni Fears and Jimmye Norman   Medication changes reported     No   Fall or balance concerns reported    No   Warm-up and Cool-down Performed on first and last piece of equipment   Resistance Training Performed Yes   VAD Patient? No     Pain Assessment   Currently in Pain? No/denies   Multiple Pain Sites No         Goals Met:  Proper associated with RPD/PD & O2 Sat Independence with exercise equipment Exercise tolerated well Strength training completed today  Goals Unmet:  Not Applicable  Comments: Followed up with [name] about her weight, hypertension and strength and stamina goals. Her weight has been maintained and her blood pressure has been maintianed in acceptable ranges. She reported improvement in ADLs and has been able to go to the store and vacuum without SOB. She also reported being able to better tolerate going up and down stairs.  Pt able to follow exercise prescription today without complaint.  Will continue to monitor for progression.    Dr. Emily Filbert is Medical Director for [place] and [name].

## 2016-09-02 DIAGNOSIS — J449 Chronic obstructive pulmonary disease, unspecified: Secondary | ICD-10-CM | POA: Diagnosis not present

## 2016-09-02 NOTE — Progress Notes (Signed)
Daily Session Note  Patient Details  Name: Lori Brady MRN: [id number] Date of Birth: Dec 23, 1945 Referring Provider:   April Manson Pulmonary Rehab from 05/25/2016 in [facility] Cardiac and Pulmonary Rehab  Referring Provider  [name], MD.      Encounter Date: 09/02/2016  Check In:     Session Check In - 09/02/16 1145      Check-In   Location [facility]-Cardiac & Pulmonary Rehab   Staff Present Carson Myrtle, BS, RRT, Respiratory Lennie Hummer, MA, ACSM RCEP, Exercise Physiologist;Kaziah Krizek Oletta Darter, BA, ACSM CEP, Exercise Physiologist   Supervising physician immediately available to respond to emergencies [name]   Physician(s) Quentin Cornwall and Joni Fears   Medication changes reported     No   Fall or balance concerns reported    No   Warm-up and Cool-down Performed as group-led Location manager Performed Yes   VAD Patient? No     Pain Assessment   Currently in Pain? No/denies   Multiple Pain Sites No         Goals Met:  Proper associated with RPD/PD & O2 Sat Independence with exercise equipment Exercise tolerated well Strength training completed today  Goals Unmet:  Not Applicable  Comments: Pt able to follow exercise prescription today without complaint.  Will continue to monitor for progression.    Dr. Emily Filbert is Medical Director for [place] and [name].

## 2016-09-04 ENCOUNTER — Encounter: Payer: Medicare Other | Admitting: *Deleted

## 2016-09-04 DIAGNOSIS — J449 Chronic obstructive pulmonary disease, unspecified: Secondary | ICD-10-CM | POA: Diagnosis not present

## 2016-09-04 NOTE — Progress Notes (Signed)
Daily Session Note  Patient Details  Name: Lori Brady MRN: [id number] Date of Birth: 1946/11/10 Referring Provider:   April Manson Pulmonary Rehab from 05/25/2016 in [facility] Cardiac and Pulmonary Rehab  Referring Provider  [name], MD.      Encounter Date: 09/04/2016  Check In:     Session Check In - 09/04/16 1014      Check-In   Location [facility]-Cardiac & Pulmonary Rehab   Staff Present Alberteen Sam, MA, ACSM RCEP, Exercise Physiologist;Amanda Oletta Darter, BA, ACSM CEP, Exercise Physiologist;Carroll Enterkin, RN, BSN   Supervising physician immediately available to respond to emergencies [name]   Physician(s) Drs. Quentin Cornwall and Quale   Medication changes reported     No   Fall or balance concerns reported    No   Warm-up and Cool-down Performed as group-led Location manager Performed Yes   VAD Patient? No     Pain Assessment   Currently in Pain? No/denies   Multiple Pain Sites No         Goals Met:  Proper associated with RPD/PD & O2 Sat Independence with exercise equipment Using PLB without cueing & demonstrates good technique Exercise tolerated well Strength training completed today  Goals Unmet:  Not Applicable  Comments: Pt able to follow exercise prescription today without complaint.  Will continue to monitor for progression.    Dr. Emily Filbert is Medical Director for [place] and [name].

## 2016-09-07 ENCOUNTER — Encounter: Payer: Medicare Other | Admitting: *Deleted

## 2016-09-07 DIAGNOSIS — J449 Chronic obstructive pulmonary disease, unspecified: Secondary | ICD-10-CM

## 2016-09-07 NOTE — Progress Notes (Signed)
Daily Session Note  Patient Details  Name: RHEN DOSSANTOS MRN: [id number] Date of Birth: 1946-09-15 Referring Provider:   April Manson Pulmonary Rehab from 05/25/2016 in [facility] Cardiac and Pulmonary Rehab  Referring Provider  [name], MD.      Encounter Date: 09/07/2016  Check In:     Session Check In - 09/07/16 1012      Check-In   Location [facility]-Cardiac & Pulmonary Rehab   Staff Present Earlean Shawl, BS, ACSM CEP, Exercise Physiologist;Amanda Oletta Darter, BA, ACSM CEP, Exercise Physiologist;Laureen Owens Shark, BS, RRT, Respiratory Therapist   Supervising physician immediately available to respond to emergencies [name]   Physician(s) Drs. Paduchow and Yao   Medication changes reported     No   Fall or balance concerns reported    No   Warm-up and Cool-down Performed on first and last piece of equipment   Resistance Training Performed Yes   VAD Patient? No     Pain Assessment   Currently in Pain? No/denies   Multiple Pain Sites No         Goals Met:  Proper associated with RPD/PD & O2 Sat Independence with exercise equipment Exercise tolerated well Strength training completed today  Goals Unmet:  Not Applicable  Comments: Pt able to follow exercise prescription today without complaint.  Will continue to monitor for progression.    Dr. Emily Filbert is Medical Director for [place] and [name].

## 2016-09-09 DIAGNOSIS — J449 Chronic obstructive pulmonary disease, unspecified: Secondary | ICD-10-CM

## 2016-09-09 NOTE — Progress Notes (Signed)
Daily Session Note  Patient Details  Name: Lori Brady MRN: [id number] Date of Birth: Sep 22, 1946 Referring Provider:   April Manson Pulmonary Rehab from 05/25/2016 in [facility] Cardiac and Pulmonary Rehab  Referring Provider  [name], MD.      Encounter Date: 09/09/2016  Check In:     Session Check In - 09/09/16 1248      Check-In   Location [facility]-Cardiac & Pulmonary Rehab   Staff Present Carson Myrtle, BS, RRT, Respiratory Lennie Hummer, MA, ACSM RCEP, Exercise Physiologist;Kurstyn Larios Oletta Darter, BA, ACSM CEP, Exercise Physiologist   Supervising physician immediately available to respond to emergencies [name]   Physician(s) Joni Fears and Jimmye Norman   Medication changes reported     No   Fall or balance concerns reported    No   Warm-up and Cool-down Performed as group-led Location manager Performed Yes   VAD Patient? No     Pain Assessment   Currently in Pain? No/denies         Goals Met:  Proper associated with RPD/PD & O2 Sat Independence with exercise equipment Exercise tolerated well Strength training completed today  Goals Unmet:  Not Applicable  Comments: Pt able to follow exercise prescription today without complaint.  Will continue to monitor for progression.    Dr. Emily Filbert is Medical Director for [place] and [name].

## 2016-09-10 DIAGNOSIS — J449 Chronic obstructive pulmonary disease, unspecified: Secondary | ICD-10-CM

## 2016-09-10 NOTE — Progress Notes (Signed)
Discharge Summary  Patient Details  Name: Lori Brady MRN: [id number] Date of Birth: 10-07-1946 Referring Provider:   Flowsheet Row Pulmonary Rehab from 05/25/2016 in [facility] Cardiac and Pulmonary Rehab  Referring Provider  Beverely Risen, MD.       Number of Visits: 73  Reason for Discharge:  Patient reached a stable level of exercise. Patient independent in their exercise.  Smoking History:  History  Smoking Status  . Former Smoker  . Packs/day: 1.00  . Years: 30.00  . Types: Cigarettes  . Quit date: 08/01/2008  Smokeless Tobacco  . Never Used    Diagnosis:  COPD, mild (HCC)  ADL UCSD:     Pulmonary Assessment Scores    Row Name 05/25/16 1638 07/15/16 1000 08/24/16 1000     ADL UCSD   ADL Phase Entry Mid Exit   SOB Score total 56 26 13   Rest 0 0 0   Walk 3 0 0   Stairs 5 3 1    Bath 0 0 0   Dress 0 0 0   Shop 5 3 2       Initial Exercise Prescription:     Initial Exercise Prescription - 05/25/16 1500      Date of Initial Exercise RX and Referring Provider   Date 05/25/16   Referring Provider Beverely Risen, MD.     Treadmill   MPH 1   Grade 0   Minutes 5   METs 2     Recumbant Bike   Level 2   RPM 50   Minutes 15   METs 2     NuStep   Level 2   Minutes 15   METs 2.5     Biostep-RELP   Level 2   Minutes 15   METs 2.5     Prescription Details   Frequency (times per week) 3   Duration Progress to 30 minutes of continuous aerobic without signs/symptoms of physical distress     Intensity   THRR 40-80% of Max Heartrate 113-140   Ratings of Perceived Exertion 11-13   Perceived Dyspnea 0-4     Progression   Progression Continue progressive overload as per policy without signs/symptoms or physical distress.     Resistance Training   Training Prescription Yes   Weight 2   Reps 10-12      Discharge Exercise Prescription (Final Exercise Prescription Changes):     Exercise Prescription Changes - 09/10/16 1100      Exercise Review    Progression Yes     Response to Exercise   Blood Pressure (Admit) 108/60   Blood Pressure (Exercise) 142/72   Blood Pressure (Exit) 124/74   Heart Rate (Admit) 103 bpm   Heart Rate (Exercise) 128 bpm   Heart Rate (Exit) 98 bpm   Oxygen Saturation (Admit) 97 %   Oxygen Saturation (Exercise) 96 %   Oxygen Saturation (Exit) 96 %   Rating of Perceived Exertion (Exercise) 14   Perceived Dyspnea (Exercise) 2   Symptoms none   Duration Progress to 45 minutes of aerobic exercise without signs/symptoms of physical distress   Intensity THRR unchanged     Progression   Progression Continue to progress workloads to maintain intensity without signs/symptoms of physical distress.     Resistance Training   Training Prescription Yes   Weight 4   Reps 10-15     Interval Training   Interval Training No     Treadmill   MPH 1.8   Grade 1  Minutes 15   METs 2.6     Recumbant Bike   Level 5   RPM 40   Minutes 15   METs 2     NuStep   Level 5   Minutes 15   METs 1.7      Functional Capacity:     6 Minute Walk    Row Name 05/25/16 1539 07/15/16 1145 08/24/16 1259     6 Minute Walk   Phase Initial Mid Program Discharge   Distance 945 feet 1125 feet 110 feet   Distance % Change  - 19 % 16 %   Walk Time 6 minutes 6 minutes 6 minutes   # of Rest Breaks 0 0 0   MPH 1.79 2.13 2.08   METS 2.37 3.2 3.16   RPE 11 14 13    Perceived Dyspnea  2 3 2    VO2 Peak 8.3 11.24 11.05   Symptoms No No No   Resting HR 88 bpm 97 bpm 102 bpm   Resting BP 142/80 120/60 118/60   Max Ex. HR 115 bpm 126 bpm 124 bpm   Max Ex. BP 158/82 164/72 116/64   2 Minute Post BP 138/82  -  -     Interval HR   Baseline HR 88 97 102   1 Minute HR 100 107 115   2 Minute HR 111 117 120   3 Minute HR 115 121 122   4 Minute HR 115 122 124   5 Minute HR 113 126 123   6 Minute HR 114 120 124   2 Minute Post HR 95 111 101   Interval Heart Rate? Yes  - Yes     Interval Oxygen   Interval Oxygen? Yes  - Yes    Baseline Oxygen Saturation % 96 % 97 % 98 %   Baseline Liters of Oxygen 0 L  - 0 L   1 Minute Oxygen Saturation % 94 % 92 % 96 %   1 Minute Liters of Oxygen 0 L  - 0 L   2 Minute Oxygen Saturation % 94 % 94 % 96 %   2 Minute Liters of Oxygen 0 L  - 0 L   3 Minute Oxygen Saturation % 94 % 96 % 96 %   3 Minute Liters of Oxygen 0 L  - 0 L   4 Minute Oxygen Saturation % 94 % 95 % 96 %   4 Minute Liters of Oxygen 0 L  - 0 L   5 Minute Oxygen Saturation % 95 % 95 % 96 %   5 Minute Liters of Oxygen 0 L  - 0 L   6 Minute Oxygen Saturation % 95 % 94 % 96 %   6 Minute Liters of Oxygen 0 L  - 0 L   2 Minute Post Oxygen Saturation % 96 % 96 % 96 %   2 Minute Post Liters of Oxygen 0 L  - 0 L      Psychological, QOL, Others - Outcomes: PHQ 2/9: Depression screen [facility] 2/9 08/25/2016 05/25/2016 03/31/2016  Decreased Interest 1 2 0  Down, Depressed, Hopeless 0 0 0  PHQ - 2 Score 1 2 0  Altered sleeping 1 0 -  Tired, decreased energy 1 1 -  Change in appetite 0 1 -  Feeling bad or failure about yourself  1 0 -  Trouble concentrating 0 0 -  Moving slowly or fidgety/restless 1 0 -  Suicidal thoughts 0  0 -  PHQ-9 Score 5 4 -  Difficult doing work/chores - Somewhat difficult -    Quality of Life:     Quality of Life - 08/25/16 0645      Quality of Life Scores   Health/Function Pre 18.13 %   Health/Function Post 20.4 %   Health/Function % Change 12.52 %   Socioeconomic Pre 20 %   Socioeconomic Post 20.33 %   Socioeconomic % Change  1.65 %   Psych/Spiritual Pre 19.67 %   Psych/Spiritual Post 20.43 %   Psych/Spiritual % Change 3.86 %   Family Pre 21 %   Family Post 20.75 %   Family % Change -1.19 %   GLOBAL Pre 19.19 %   GLOBAL Post 20.44 %   GLOBAL % Change 6.51 %      Personal Goals: Goals established at orientation with interventions provided to work toward goal.     Personal Goals and Risk Factors at Admission - 05/25/16 1628      Core Components/Risk Factors/Patient  Goals on Admission    Weight Management Yes;Obesity   Admit Weight 153 lb 4.8 oz (69.5 kg)   Goal Weight: Short Term 145 lb (65.8 kg)   Goal Weight: Long Term 130 lb (59 kg)   Expected Outcomes Short Term: Continue to assess and modify interventions until short term weight is achieved;Weight Loss: Understanding of general recommendations for a balanced deficit meal plan, which promotes 1-2 lb weight loss per week and includes a negative energy balance of [phone] kcal/d   Increase Strength and Stamina Yes   Intervention Provide advice, education, support and counseling about physical activity/exercise needs.;Develop an individualized exercise prescription for aerobic and resistive training based on initial evaluation findings, risk stratification, comorbidities and participant's personal goals.   Expected Outcomes Achievement of increased cardiorespiratory fitness and enhanced flexibility, muscular endurance and strength shown through measurements of functional capacity and personal statement of participant.   Improve shortness of breath with ADL's Yes   Intervention Provide education, individualized exercise plan and daily activity instruction to help decrease symptoms of SOB with activities of daily living.   Expected Outcomes Short Term: Achieves a reduction of symptoms when performing activities of daily living.   Develop more efficient breathing techniques such as purse lipped breathing and diaphragmatic breathing; and practicing self-pacing with activity Yes  HAs several inhalers. Stated understanding of use and need to rinse with steriod inhaler.   Intervention Provide education, demonstration and support about specific breathing techniuqes utilized for more efficient breathing. Include techniques such as pursed lipped breathing, diaphragmatic breathing and self-pacing activity.   Expected Outcomes Short Term: Participant will be able to demonstrate and use breathing techniques as needed  throughout daily activities.   Hypertension Yes   Intervention Provide education on lifestyle modifcations including regular physical activity/exercise, weight management, moderate sodium restriction and increased consumption of fresh fruit, vegetables, and low fat dairy, alcohol moderation, and smoking cessation.;Monitor prescription use compliance.   Expected Outcomes Short Term: Continued assessment and intervention until BP is < 140/7890mm HG in hypertensive participants. < 130/2580mm HG in hypertensive participants with diabetes, heart failure or chronic kidney disease.;Long Term: Maintenance of blood pressure at goal levels.   Lipids Yes   Intervention Provide education and support for participant on nutrition & aerobic/resistive exercise along with prescribed medications to achieve LDL 70mg , HDL >40mg .   Expected Outcomes Short Term: Participant states understanding of desired cholesterol values and is compliant with medications prescribed. Participant is following exercise prescription  and nutrition guidelines.;Long Term: Cholesterol controlled with medications as prescribed, with individualized exercise RX and with personalized nutrition plan. Value goals: LDL < 70mg , HDL > 40 mg.       Personal Goals Discharge:     Goals and Risk Factor Review    Row Name 06/08/16 1122 06/10/16 1306 06/12/16 1458 06/12/16 1504 06/15/16 1040     Core Components/Risk Factors/Patient Goals Review   Personal Goals Review Develop more efficient breathing techniques such as purse lipped breathing and diaphragmatic breathing and practicing self-pacing with activity. Weight Management/Obesity;Hypertension Increase knowledge of respiratory medications and ability to use respiratory devices properly. Improve shortness of breath with ADL's Sedentary;Increase Strength and Stamina   Review Pursed lip breathing techniques were reviewed with the patient. She demonstrated understanding of pursed lip breathing and was able  to use this breathing technique during her exercise today to help control shortness of breath. TAlked to [facility] today about weight and blood pressure risk factors.  Today is her 2nd session, so I reviewed the basics of keeping control with her medication, nutrition and exercise. She stated she was not sure if she wanted to meet with the RD. Explained, that the RD evaluation can help balance her nutrition n plan. control.   Reviewed Mr. Lomeli medications with her today.  She knows when and how to take them.  She also has an aerochamber to use with her MDI's. Ms. Gionfriddo says that she already feels stronger after her first week of exercise.   Marlaysia is off to a good start in rehab.  She is already feeling stronger and seems to have an increase in stamina.  She is doing the weights and stretches at home!   Expected Outcomes Patient will continue to use pursed lip breathing techniques to control SOB during exercise in order to be able to continue to progress and increase in strength and stamina. Continued exercise, nutrion and medication use to help with Risk Factor control.  Obtain appoinment with thte RD to see if there are any nutrition changes she can make to help with weight control.  This knowledge should help keep inflamation and excerbations to a minimum . Continuing to exercise should help decrease her SOB and increase her ability to preform ADLs.  Cicilia will continue to come to exercise and education.  We will discuss a home exercise program soon.   Row Name 07/03/16 1353 07/03/16 1359 07/03/16 1401 07/06/16 1043 08/05/16 1258     Core Components/Risk Factors/Patient Goals Review   Personal Goals Review Increase knowledge of respiratory medications and ability to use respiratory devices properly. Develop more efficient breathing techniques such as purse lipped breathing and diaphragmatic breathing and practicing self-pacing with activity. Improve shortness of breath with ADL's Sedentary;Increase Strength  and Stamina Weight Management/Obesity;Hypertension;Increase Strength and Stamina;Sedentary   Review Ms. Hallowell takes Proventil, Spiriva, Breo Ellipta, and Albuterol for her respiratory issues.  She has been taking them for a while and knows the proper techiques on how to take all the medications. She has a spacer at home for the Proventil.  She says that the Virgel Bouquet has helped to most with her SOB since she started taking it.  Ms. Chowning show proper technique for PLB when she is SOB and exercising. Ms. Kessen is having less SOB and is finding it easier to walk longer distances and when taking the stairs.  She is having less shuffling of her feet and less stumbling. Ms. Savary plans to start some home exercise soon, but has  already been consistantly coming to class. She reports walking better with more confidence. She also stated that her strength and stamina are imporving.  Ashlin's weight has been steady and her blood pressures were good too.  She feels like exercise has "been like a miracle" as she is feeling so much better with her strength and stamina.  She is able to do more around the house.  She is also walking 2 days a week at home for 30 min!!   Expected Outcomes If Lerae continues to take her medicatons as scheduled and when needed, this should help decrease exacerbations and SOB for her.  The technique should help her recover from SOB quicker and help her if she is having anxiety or exacerbations.  Continued exercise should help Ms. Platter increase her muscle strength and lung health as she progresses in the program.  Ms. Palmatier will soon begin adding home exercise to her routine which will increase her frequency of exercise and continue to aid in strength and stamina improvments.  Yilia will continue to come to exercise and education classses to work on strength and stamina and risk factor modification.  We will continue to monitor her progression.   Row Name 08/07/16 1502 08/07/16 1504 08/07/16 1506 08/31/16  1114 09/02/16 1510     Core Components/Risk Factors/Patient Goals Review   Personal Goals Review Increase knowledge of respiratory medications and ability to use respiratory devices properly. Improve shortness of breath with ADL's Develop more efficient breathing techniques such as purse lipped breathing and diaphragmatic breathing and practicing self-pacing with activity. Weight Management/Obesity;Increase Strength and Stamina;Hypertension Increase Strength and Stamina;Develop more efficient breathing techniques such as purse lipped breathing and diaphragmatic breathing and practicing self-pacing with activity.;Improve shortness of breath with ADL's;Increase knowledge of respiratory medications and ability to use respiratory devices properly.   Review Ms. Schneider takes Proventil, Spiriva, Breo and Albuterol SVNs at home.  She has a spacer for her MDIs.   Ms. Cottam says that everything is easier for her to do since she has been exercising.  She that her muscles are stronger and she is shuffeling her feel less. Ms. Wilcher demonstrates proper technique of PLB. Followed up with Ms Chestine Spore about her weight, hypertension and strength and stamina goals. Her weight has been maintained and her blood pressure has been maintianed in acceptable ranges. She reported improvement in ADLs and has been able to go to the store and vacuum without SOB. She also reported being able to better tolerate going up and down stairs.  Ms Varghese will be graduating soon. She has a good understanding of her MDI's and PLB. She states she has become more independent which her family has noticed the difference. Ms Cowper looks forward to continuing her exercise at [employer] and will continue her strength  training at home with her newly purchased weights.    Expected Outcomes Taking her respiratory medication as prescribed by the doctor will help decrease her SOB and chances of exacerbations.  Continued strenght training and cardiovascular  exercise will help improve Ms. Clarks SOB and stamina during ADLs.  PLB will help Ms. Lieser regain her breath during exercise.  Ms. Axe will continue her consistant attendance to class and continue to follow her exercise prescription which will help her progress towards contiued improvements in strength and stamina, weight managment, and controlled blood pressures.  Continue Exercising and managing her Asthma and Mild COPD for quality of life.      Nutrition & Weight - Outcomes:  Pre Biometrics - 05/25/16 1545      Pre Biometrics   Height 5\' 3"  (1.6 m)   Weight 153 lb 4.8 oz (69.5 kg)   Waist Circumference 36.5 inches   Hip Circumference 41 inches   Waist to Hip Ratio 0.89 %   BMI (Calculated) 27.2         Post Biometrics - 08/24/16 1307       Post  Biometrics   Height 5\' 3"  (1.6 m)   Weight 153 lb 3.2 oz (69.5 kg)   Waist Circumference 36 inches   Hip Circumference 42 inches   Waist to Hip Ratio 0.86 %   BMI (Calculated) 27.2      Nutrition:     Nutrition Therapy & Goals - 05/25/16 1627      Intervention Plan   Intervention Prescribe, educate and counsel regarding individualized specific dietary modifications aiming towards targeted core components such as weight, hypertension, lipid management, diabetes, heart failure and other comorbidities.   Expected Outcomes Short Term Goal: Understand basic principles of dietary content, such as calories, fat, sodium, cholesterol and nutrients.;Short Term Goal: A plan has been developed with personal nutrition goals set during dietitian appointment.;Long Term Goal: Adherence to prescribed nutrition plan.      Nutrition Discharge:   Education Questionnaire Score:     Knowledge Questionnaire Score - 08/25/16 0347      Knowledge Questionnaire Score   Post Score 10/10      Goals reviewed with patient; copy given to patient.

## 2016-09-11 ENCOUNTER — Encounter: Payer: Medicare Other | Admitting: *Deleted

## 2016-09-11 DIAGNOSIS — J449 Chronic obstructive pulmonary disease, unspecified: Secondary | ICD-10-CM | POA: Diagnosis not present

## 2016-09-11 NOTE — Progress Notes (Signed)
Daily Session Note  Patient Details  Name: WHITLEY PATCHEN MRN: [id number] Date of Birth: 08/24/1946 Referring Provider:   [facility] from 05/25/2016 in [facility]  Referring Provider  [name], MD.      Encounter Date: 09/11/2016  Check In:     Session Check In - 09/11/16 1057      Check-In   Location [facility]   Staff Present Heath Lark, RN, BSN, Lance Sell, BA, ACSM CEP, Exercise Physiologist;Liviya Santini, RN, BSN   Supervising physician immediately available to respond to emergencies See telemetry face sheet for immediately available ER MD   Medication changes reported     No   Fall or balance concerns reported    No   Warm-up and Cool-down Performed on first and last piece of equipment   Resistance Training Performed Yes   VAD Patient? No     Pain Assessment   Currently in Pain? No/denies           Exercise Prescription Changes - 09/10/16 1100      Exercise Review   Progression Yes     Response to Exercise   Blood Pressure (Admit) 108/60   Blood Pressure (Exercise) 142/72   Blood Pressure (Exit) 124/74   Heart Rate (Admit) 103 bpm   Heart Rate (Exercise) 128 bpm   Heart Rate (Exit) 98 bpm   Oxygen Saturation (Admit) 97 %   Oxygen Saturation (Exercise) 96 %   Oxygen Saturation (Exit) 96 %   Rating of Perceived Exertion (Exercise) 14   Perceived Dyspnea (Exercise) 2   Symptoms none   Duration Progress to 45 minutes of aerobic exercise without signs/symptoms of physical distress   Intensity THRR unchanged     Progression   Progression Continue to progress workloads to maintain intensity without signs/symptoms of physical distress.     Resistance Training   Training Prescription Yes   Weight 4   Reps 10-15     Interval Training   Interval Training No     Treadmill   MPH 1.8   Grade 1   Minutes 15   METs 2.6     Recumbant Bike   Level 5   RPM 40   Minutes 15   METs  2     NuStep   Level 5   Minutes 15   METs 1.7      Goals Met:  Proper associated with RPD/PD & O2 Sat Exercise tolerated well  Goals Unmet:  Not Applicable  Comments:     Dr. Emily Filbert is Medical Director for [name].

## 2016-09-11 NOTE — Progress Notes (Signed)
Pulmonary Individual Treatment Plan  Patient Details  Name: NATTALIE SANTIESTEBAN MRN: [id number] Date of Birth: 01/22/1946 Referring Provider:   Flowsheet Row Pulmonary Rehab from 05/25/2016 in [facility] Cardiac and Pulmonary Rehab  Referring Provider  [name], MD.      Initial Encounter Date:  Flowsheet Row Pulmonary Rehab from 05/25/2016 in [facility] Cardiac and Pulmonary Rehab  Date  05/25/16  Referring Provider  [name], MD.      Visit Diagnosis: COPD, mild ([place])  Patient's Home Medications on Admission:  Current Outpatient Prescriptions:    albuterol (ACCUNEB) 0.63 MG/3ML nebulizer solution, Take 1 ampule by nebulization every 6 (six) hours as needed for wheezing., Disp: , Rfl:    alendronate (FOSAMAX) 70 MG tablet, Take 1 tablet (70 mg total) by mouth once a week., Disp: 12 tablet, Rfl: 4   aspirin 81 MG tablet, Take 81 mg by mouth daily., Disp: , Rfl:    betamethasone valerate (VALISONE) 0.1 % cream, APPLY TOPICALLY TO AFFECTED AREA SPARINGLY 2 TIMES DAILY., Disp: 15 g, Rfl: 5   chlorthalidone (HYGROTON) 25 MG tablet, Take 25 mg by mouth daily., Disp: , Rfl:    Cholecalciferol (VITAMIN D) 2000 UNITS CAPS, Take 1 capsule by mouth daily., Disp: , Rfl:    citalopram (CELEXA) 20 MG tablet, TAKE 1 TABLET BY MOUTH ONCE DAILY., Disp: 90 tablet, Rfl: 1   doxepin (SINEQUAN) 25 MG capsule, Take 25 mg by mouth at bedtime. , Disp: , Rfl:    fluticasone (FLONASE) 50 MCG/ACT nasal spray, USE 2 SPRAYS IN EACH NOSTRIL ONCE DAILY., Disp: 16 g, Rfl: 11   Fluticasone Furoate-Vilanterol (BREO ELLIPTA) 100-25 MCG/INH AEPB, Inhale 1 puff into the lungs daily. , Disp: , Rfl:    lisinopril (PRINIVIL,ZESTRIL) 20 MG tablet, , Disp: , Rfl:    LORazepam (ATIVAN) 0.5 MG tablet, Take 1 tablet (0.5 mg total) by mouth every 8 (eight) hours as needed. for anxiety, Disp: 90 tablet, Rfl: 5   montelukast (SINGULAIR) 10 MG tablet, Take 1 tablet (10 mg total) by mouth daily., Disp: 90 tablet, Rfl: 1   Omega-3  Fatty Acids (FISH OIL) 1000 MG CAPS, Take 1 capsule by mouth daily., Disp: , Rfl:    omeprazole (PRILOSEC) 20 MG capsule, Take 20 mg by mouth daily., Disp: , Rfl:    omeprazole (PRILOSEC) 40 MG capsule, Reported on 05/28/2016, Disp: , Rfl:    potassium chloride SA (K-DUR,KLOR-CON) 20 MEQ tablet, TAKE 1 TABLET BY MOUTH TWICE DAILY (Patient taking differently: TAKE 1 TABLET BY MOUTH DAILY), Disp: 60 tablet, Rfl: 5   PROVENTIL HFA 108 (90 Base) MCG/ACT inhaler, Inhale 1-2 puffs into the lungs every 4 (four) hours as needed. , Disp: , Rfl:    simvastatin (ZOCOR) 40 MG tablet, TAKE 1 TABLET BY MOUTH AT BEDTIME., Disp: 90 tablet, Rfl: 3   tiotropium (SPIRIVA) 18 MCG inhalation capsule, Place 1 capsule (18 mcg total) into inhaler and inhale daily., Disp: 90 capsule, Rfl: 1   triamcinolone cream (KENALOG) 0.1 %, , Disp: , Rfl:   Past Medical History: Past Medical History:  Diagnosis Date   Arthritis    Asthma    Crohn's disease ([place])    Emphysema of lung ([place])    GERD (gastroesophageal reflux disease)    History of chicken pox    History of heart attack    Hx of completed stroke    Hypercholesteremia    Hypertension    Seasonal allergies    UTI (lower urinary tract infection)  Tobacco Use: History  Smoking Status   Former Smoker   Packs/day: 1.00   Years: 30.00   Types: Cigarettes   Quit date: 08/01/2008  Smokeless Tobacco   Never Used    Labs: Recent Review Flowsheet Data    Labs for ITP Cardiac and Pulmonary Rehab Latest Ref Rng & Units 12/07/2011 04/02/2016   Cholestrol 100 - 199 mg/dL 133 141   LDLCALC 0 - 99 mg/dL 63 78   HDL >39 mg/dL 56 32(L)   Trlycerides 0 - 149 mg/dL 71 155(H)       ADL UCSD:     Pulmonary Assessment Scores    Row Name 05/25/16 1638 07/15/16 1000 08/24/16 1000     ADL UCSD   ADL Phase Entry Mid Exit   SOB Score total 56 26 13   Rest 0 0 0   Walk 3 0 0   Stairs 5 3 1    Bath 0 0 0   Dress 0 0 0   Shop 5 3 2        Pulmonary Function Assessment:     Pulmonary Function Assessment - 05/25/16 1634      Pulmonary Function Tests   RV% 67 %   DLCO% 51 %     Initial Spirometry Results   FVC% 68 %   FEV1% 60 %   FEV1/FVC Ratio 70     Post Bronchodilator Spirometry Results   FVC% 75 %   FEV1% 68 %   FEV1/FVC Ratio 72     Breath   Bilateral Breath Sounds Clear   Shortness of Breath No      Exercise Target Goals:    Exercise Program Goal: Individual exercise prescription set with THRR, safety & activity barriers. Participant demonstrates ability to understand and report RPE using BORG scale, to self-measure pulse accurately, and to acknowledge the importance of the exercise prescription.  Exercise Prescription Goal: Starting with aerobic activity 30 plus minutes a day, 3 days per week for initial exercise prescription. Provide home exercise prescription and guidelines that participant acknowledges understanding prior to discharge.  Activity Barriers & Risk Stratification:   6 Minute Walk:     6 Minute Walk    Row Name 05/25/16 1539 07/15/16 1145 08/24/16 1259     6 Minute Walk   Phase Initial Mid Program Discharge   Distance 945 feet 1125 feet 110 feet   Distance % Change  -- 19 % 16 %   Walk Time 6 minutes 6 minutes 6 minutes   # of Rest Breaks 0 0 0   MPH 1.79 2.13 2.08   METS 2.37 3.2 3.16   RPE 11 14 13    Perceived Dyspnea  2 3 2    VO2 Peak 8.3 11.24 11.05   Symptoms No No No   Resting HR 88 bpm 97 bpm 102 bpm   Resting BP 142/80 120/60 118/60   Max Ex. HR 115 bpm 126 bpm 124 bpm   Max Ex. BP 158/82 164/72 116/64   2 Minute Post BP 138/82  --  --     Interval HR   Baseline HR 88 97 102   1 Minute HR 100 107 115   2 Minute HR 111 117 120   3 Minute HR 115 121 122   4 Minute HR 115 122 124   5 Minute HR 113 126 123   6 Minute HR 114 120 124   2 Minute Post HR 95 111 101   Interval Heart Rate?  Yes  -- Yes     Interval Oxygen   Interval Oxygen? Yes  -- Yes    Baseline Oxygen Saturation % 96 % 97 % 98 %   Baseline Liters of Oxygen 0 L  -- 0 L   1 Minute Oxygen Saturation % 94 % 92 % 96 %   1 Minute Liters of Oxygen 0 L  -- 0 L   2 Minute Oxygen Saturation % 94 % 94 % 96 %   2 Minute Liters of Oxygen 0 L  -- 0 L   3 Minute Oxygen Saturation % 94 % 96 % 96 %   3 Minute Liters of Oxygen 0 L  -- 0 L   4 Minute Oxygen Saturation % 94 % 95 % 96 %   4 Minute Liters of Oxygen 0 L  -- 0 L   5 Minute Oxygen Saturation % 95 % 95 % 96 %   5 Minute Liters of Oxygen 0 L  -- 0 L   6 Minute Oxygen Saturation % 95 % 94 % 96 %   6 Minute Liters of Oxygen 0 L  -- 0 L   2 Minute Post Oxygen Saturation % 96 % 96 % 96 %   2 Minute Post Liters of Oxygen 0 L  -- 0 L      Initial Exercise Prescription:     Initial Exercise Prescription - 05/25/16 1500      Date of Initial Exercise RX and Referring Provider   Date 05/25/16   Referring Provider [name], MD.     Treadmill   MPH 1   Grade 0   Minutes 5   METs 2     Recumbant Bike   Level 2   RPM 50   Minutes 15   METs 2     NuStep   Level 2   Minutes 15   METs 2.5     Biostep-RELP   Level 2   Minutes 15   METs 2.5     Prescription Details   Frequency (times per week) 3   Duration Progress to 30 minutes of continuous aerobic without signs/symptoms of physical distress     Intensity   THRR 40-80% of Max Heartrate 113-140   Ratings of Perceived Exertion 11-13   Perceived Dyspnea 0-4     Progression   Progression Continue progressive overload as per policy without signs/symptoms or physical distress.     Resistance Training   Training Prescription Yes   Weight 2   Reps 10-12      Perform Capillary Blood Glucose checks as needed.  Exercise Prescription Changes:     Exercise Prescription Changes    Row Name 05/25/16 1500 06/08/16 1100 06/17/16 1400 06/30/16 1400 07/01/16 1400     Exercise Review   Progression  --  -- Yes Yes Yes     Response to Exercise   Blood Pressure  (Admit) 142/80 124/72 132/70 128/70  --   Blood Pressure (Exercise) 158/82 162/80 134/64 134/60  --   Blood Pressure (Exit) 138/68 130/70 126/64 108/60  --   Heart Rate (Admit) 88 bpm 110 bpm 98 bpm 98 bpm  --   Heart Rate (Exercise) 114 bpm 128 bpm 110 bpm 118 bpm  --   Heart Rate (Exit) 90 bpm 103 bpm 94 bpm 91 bpm  --   Oxygen Saturation (Admit) 96 % 97 % 97 % 99 %  --   Oxygen Saturation (Exercise) 95 % 94 %  94 % 95 %  --   Oxygen Saturation (Exit) 97 % 94 % 94 % 97 %  --   Rating of Perceived Exertion (Exercise) 11 12 14 14   --   Perceived Dyspnea (Exercise)  -- 3 2 2   --   Symptoms  -- none none none none   Comments  --  --  --  -- Home Exercise Guidelines given 07/01/16   Duration  -- Progress to 30 minutes of continuous aerobic without signs/symptoms of physical distress Progress to 30 minutes of continuous aerobic without signs/symptoms of physical distress Progress to 45 minutes of aerobic exercise without signs/symptoms of physical distress Progress to 45 minutes of aerobic exercise without signs/symptoms of physical distress   Intensity  -- THRR unchanged  113-140 THRR unchanged THRR unchanged THRR unchanged     Progression   Progression  -- Continue progressive overload as per policy without signs/symptoms or physical distress. Continue progressive overload as per policy without signs/symptoms or physical distress. Continue to progress workloads to maintain intensity without signs/symptoms of physical distress. Continue to progress workloads to maintain intensity without signs/symptoms of physical distress.   Average METs  -- 1.9 1.92 1.97 1.97     Resistance Training   Training Prescription  -- Yes Yes Yes Yes   Weight  -- 2 2 2  lbs 2 lbs   Reps  -- 10-12 10-12 10-15 10-15     Interval Training   Interval Training  --  -- No No No     Treadmill   MPH  -- 1 1 1.3 1.3   Grade  -- 0 0 0 0   Minutes  -- 5 8 11 11    METs  -- 2 1.77 2 2     Recumbant Bike   Level  -- 2 2 3  3    RPM  -- 50 35 35 35   Minutes  -- 15 20 15 15    METs  -- 2 2.2 2.1 2.1     NuStep   Level  -- 2 2 2 2    Minutes  -- 15 15 15 15    METs  -- 2 1.8 1.8 1.8     Home Exercise Plan   Plans to continue exercise at  --  --  --  -- Home  Walking with plans to join [employer] after graduation   Frequency  --  --  --  -- Add 1 additional day to program exercise sessions.   [place] Name 07/15/16 1400 07/28/16 1400 08/12/16 1200 08/24/16 1300 08/27/16 1200     Exercise Review   Progression Yes Yes Yes Yes Yes     Response to Exercise   Blood Pressure (Admit) 120/60 114/74 120/68 120/68 118/60   Blood Pressure (Exercise) 132/50 122/62 128/64 128/64 122/64   Blood Pressure (Exit) 120/64 96/54 110/60 110/60 120/56   Heart Rate (Admit) 94 bpm 102 bpm 106 bpm 106 bpm 102 bpm   Heart Rate (Exercise) 124 bpm 115 bpm 125 bpm 125 bpm 124 bpm   Heart Rate (Exit) 86 bpm 85 bpm 97 bpm 97 bpm 81 bpm   Oxygen Saturation (Admit) 99 % 98 % 98 % 98 % 98 %   Oxygen Saturation (Exercise) 94 % 97 % 93 % 93 % 94 %   Oxygen Saturation (Exit) 95 % 96 % 96 % 96 % 95 %   Rating of Perceived Exertion (Exercise) 13 15 15 15 13    Perceived Dyspnea (Exercise) 2 2  2 2 2    Symptoms none none none none none   Comments Home Exercise Guidelines given 07/01/16 Home Exercise Guidelines given 07/01/16  --  --  --   Duration Progress to 45 minutes of aerobic exercise without signs/symptoms of physical distress Progress to 45 minutes of aerobic exercise without signs/symptoms of physical distress Progress to 45 minutes of aerobic exercise without signs/symptoms of physical distress Progress to 45 minutes of aerobic exercise without signs/symptoms of physical distress Progress to 45 minutes of aerobic exercise without signs/symptoms of physical distress   Intensity THRR unchanged THRR unchanged THRR unchanged THRR unchanged THRR unchanged     Progression   Progression Continue to progress workloads to maintain intensity without  signs/symptoms of physical distress. Continue to progress workloads to maintain intensity without signs/symptoms of physical distress. Continue to progress workloads to maintain intensity without signs/symptoms of physical distress. Continue to progress workloads to maintain intensity without signs/symptoms of physical distress. Continue to progress workloads to maintain intensity without signs/symptoms of physical distress.   Average METs 2.25 2.27 2.5 2.5  --     Resistance Training   Training Prescription Yes Yes Yes Yes Yes   Weight 2 lbs 2 lbs 4 4 4    Reps 10-15 10-15 10-15 10-15 10-15     Interval Training   Interval Training No No No No No     Treadmill   MPH 1.5 1.7 1.8 1.8  --   Grade 1 1 1 1   --   Minutes 15 15 15 15   --   METs  -- 2.42 2.6 2.6  --     Recumbant Bike   Level 4 4 4 4 4    RPM 37 35 36 36 50   Minutes 15 15 15 15 15    METs 2.3 2.3 2.4 2.4  --     NuStep   Level 4 4 4 4 4    Minutes 15 15 15 15 15    METs 2.1 2.1  --  --  --     Home Exercise Plan   Plans to continue exercise at Home  Walking with plans to join [employer] after graduation Home  Walking with plans to join [employer] 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 Add 2 additional days to program exercise sessions.  --  --  --   Row Name 09/10/16 1100             Exercise Review   Progression Yes         Response to Exercise   Blood Pressure (Admit) 108/60       Blood Pressure (Exercise) 142/72       Blood Pressure (Exit) 124/74       Heart Rate (Admit) 103 bpm       Heart Rate (Exercise) 128 bpm       Heart Rate (Exit) 98 bpm       Oxygen Saturation (Admit) 97 %       Oxygen Saturation (Exercise) 96 %       Oxygen Saturation (Exit) 96 %       Rating of Perceived Exertion (Exercise) 14       Perceived Dyspnea (Exercise) 2       Symptoms none       Duration Progress to 45 minutes of  aerobic exercise without signs/symptoms of physical distress  Intensity THRR unchanged         Progression   Progression Continue to progress workloads to maintain intensity without signs/symptoms of physical distress.         Resistance Training   Training Prescription Yes       Weight 4       Reps 10-15         Interval Training   Interval Training No         Treadmill   MPH 1.8       Grade 1       Minutes 15       METs 2.6         Recumbant Bike   Level 5       RPM 40       Minutes 15       METs 2         NuStep   Level 5       Minutes 15       METs 1.7          Exercise Comments:     Exercise Comments    Row Name 06/08/16 1121 06/17/16 1419 06/30/16 1452 07/01/16 1117 07/15/16 1452   Exercise Comments First full day of exercise!  Patient was oriented to gym and equipment including functions, settings, policies, and procedures.  Patient's individual exercise prescription and treatment plan were reviewed.  All starting workloads were established based on the results of the 6 minute walk test done at initial orientation visit.  The plan for exercise progression was also introduced and progression will be customized based on patient's performance and goals. Rocio is off to a good start with exercise.  She is already starting to see some improvements.  We will continue to monitor for progression. Sidda is doing very well.  She is already up to 1.3 mph and 11 min on the treadmill.  We will continue to montior her progression.  Reviewed home exercise with pt today.  Pt plans to walk at home for exercise.  Reviewed THR, pulse, RPE, sign and symptoms, and when to call 911 or MD.  Also discussed weather considerations and indoor options.  Once she graduates, Anala plans to join [employer].  Pt voiced understanding. Portia continues to do well with exercise.  She is up to 1.5 mph on the treadmill and level 4 on both the recumbent bike and stepper.  All for a full 15 minutes!!!   We will continue to monitor for progression.   [place] Name 07/28/16 1439 08/12/16 1223 08/24/16 1305 08/27/16 1219 09/10/16 1146   Exercise Comments Winter is making good progress in her exercise.  She is now up to 1.7 mph on the treadmill for 15 min!  We will continue to monitor her progress. Garrie is progressing well with exercise. 6 min walk done today. See in min data sheet for detailed report on progress. Upon graduation Kalicia plans to exercise at [employer].  Preslei continues to progress well with exercise. 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  She plans to comtinue at a community facility.      Discharge Exercise Prescription (Final Exercise Prescription Changes):     Exercise Prescription Changes - 09/10/16 1100      Exercise Review   Progression Yes     Response to Exercise   Blood Pressure (Admit) 108/60   Blood Pressure (Exercise) 142/72   Blood Pressure (Exit) 124/74   Heart  Rate (Admit) 103 bpm   Heart Rate (Exercise) 128 bpm   Heart Rate (Exit) 98 bpm   Oxygen Saturation (Admit) 97 %   Oxygen Saturation (Exercise) 96 %   Oxygen Saturation (Exit) 96 %   Rating of Perceived Exertion (Exercise) 14   Perceived Dyspnea (Exercise) 2   Symptoms none   Duration Progress to 45 minutes of aerobic exercise without signs/symptoms of physical distress   Intensity THRR unchanged     Progression   Progression Continue to progress workloads to maintain intensity without signs/symptoms of physical distress.     Resistance Training   Training Prescription Yes   Weight 4   Reps 10-15     Interval Training   Interval Training No     Treadmill   MPH 1.8   Grade 1   Minutes 15   METs 2.6     Recumbant Bike   Level 5   RPM 40   Minutes 15   METs 2     NuStep   Level 5   Minutes 15   METs 1.7       Nutrition:  Target Goals: Understanding of nutrition guidelines, daily intake of sodium <1533m, cholesterol <2060m calories 30% from  fat and 7% or less from saturated fats, daily to have 5 or more servings of fruits and vegetables.  Biometrics:     Pre Biometrics - 05/25/16 1545      Pre Biometrics   Height 5' 3"  (1.6 m)   Weight 153 lb 4.8 oz (69.5 kg)   Waist Circumference 36.5 inches   Hip Circumference 41 inches   Waist to Hip Ratio 0.89 %   BMI (Calculated) 27.2         Post Biometrics - 08/24/16 1307       Post  Biometrics   Height 5' 3"  (1.6 m)   Weight 153 lb 3.2 oz (69.5 kg)   Waist Circumference 36 inches   Hip Circumference 42 inches   Waist to Hip Ratio 0.86 %   BMI (Calculated) 27.2      Nutrition Therapy Plan and Nutrition Goals:     Nutrition Therapy & Goals - 05/25/16 1627      Intervention Plan   Intervention Prescribe, educate and counsel regarding individualized specific dietary modifications aiming towards targeted core components such as weight, hypertension, lipid management, diabetes, heart failure and other comorbidities.   Expected Outcomes Short Term Goal: Understand basic principles of dietary content, such as calories, fat, sodium, cholesterol and nutrients.;Short Term Goal: A plan has been developed with personal nutrition goals set during dietitian appointment.;Long Term Goal: Adherence to prescribed nutrition plan.      Nutrition Discharge: Rate Your Plate Scores:   Psychosocial: Target Goals: Acknowledge presence or absence of depression, maximize coping skills, provide positive support system. Participant is able to verbalize types and ability to use techniques and skills needed for reducing stress and depression.  Initial Review & Psychosocial Screening:     Initial Psych Review & Screening - 05/25/16 1630      Initial Review   Current issues with History of Depression;Current Psychotropic Meds     Family Dynamics   Good Support System? Yes  Has a very large family unit for support.  Husband passed away in 2003-11-2003   Barriers   Psychosocial barriers to  participate in program There are no identifiable barriers or psychosocial needs.;The patient should benefit from training in stress management and relaxation.  Screening Interventions   Interventions Encouraged to exercise      Quality of Life Scores:     Quality of Life - 08/25/16 0645      Quality of Life Scores   Health/Function Pre 18.13 %   Health/Function Post 20.4 %   Health/Function % Change 12.52 %   Socioeconomic Pre 20 %   Socioeconomic Post 20.33 %   Socioeconomic % Change  1.65 %   Psych/Spiritual Pre 19.67 %   Psych/Spiritual Post 20.43 %   Psych/Spiritual % Change 3.86 %   Family Pre 21 %   Family Post 20.75 %   Family % Change -1.19 %   GLOBAL Pre 19.19 %   GLOBAL Post 20.44 %   GLOBAL % Change 6.51 %      PHQ-9: Recent Review Flowsheet Data    Depression screen [facility] 2/9 08/25/2016 05/25/2016 03/31/2016   Decreased Interest 1 2 0   Down, Depressed, Hopeless 0 0 0   PHQ - 2 Score 1 2 0   Altered sleeping 1 0 -   Tired, decreased energy 1 1 -   Change in appetite 0 1 -   Feeling bad or failure about yourself  1 0 -   Trouble concentrating 0 0 -   Moving slowly or fidgety/restless 1 0 -   Suicidal thoughts 0 0 -   PHQ-9 Score 5 4 -   Difficult doing work/chores - Somewhat difficult  -      Psychosocial Evaluation and Intervention:     Psychosocial Evaluation - 09/02/16 1517      Discharge Psychosocial Assessment & Intervention   Comments Ms Braunschweig has enjoyed [name] - both the exercise and social participation. She has improved her independence and can do many activities by herself which her family has noticed and appreciate. Living with her sister and having her children living close by, Ms Klenke has good support for her Chronic LungDisease.he lives  has a good relationship      Psychosocial Re-Evaluation:     Psychosocial Re-Evaluation    Row Name 07/01/16 1035 07/27/16 1109           Psychosocial Re-Evaluation   Comments Counselor  met with [name] today for follow up.  She reports progress in the areas of walking better; doing more normal activities; able to shop and get out more and improved mood since coming into this program.  she continues to struggle somewhat with sleep, but reports getting at least 6 hours/night most nights.  Counselor commended Ms. Scullion on her hard work and progress made.   Follow up with Ms. Mounsey today reporting continued progress in this program with ability to go up and down steps with greater ease.  She states her family has noticed her improvements as well.  Ms. Chopp also reported that her younger brother died suddenly this past 2023-02-12.  Counselor offered condolences and asked Ms. Deal how she is coping with this.  She reports her family support system has been very helpful during this time, and that has made this loss easier.  Counselor will continue to provide support for Ms. Martello during the course of this program.          Education: Education Goals: Education classes will be provided on a weekly basis, covering required topics. Participant will state understanding/return demonstration of topics presented.  Learning Barriers/Preferences:     Learning Barriers/Preferences - 05/25/16 1631      Learning Barriers/Preferences   Learning Barriers  Hearing  Deaf in Right ear since Stroke in 2007   Learning Preferences None      Education Topics: Initial Evaluation Education: - Verbal, written and demonstration of respiratory meds, RPE/PD scales, oximetry and breathing techniques. Instruction on use of nebulizers and MDIs: cleaning and proper use, rinsing mouth with steroid doses and importance of monitoring MDI activations. Flowsheet Row Pulmonary Rehab from 08/31/2016 in [facility] Cardiac and Pulmonary Rehab  Date  05/25/16  Educator  sb  Instruction Review Code  2- meets goals/outcomes      General Nutrition Guidelines/Fats and Fiber: -Group instruction provided by verbal, written  material, models and posters to present the general guidelines for heart healthy nutrition. Gives an explanation and review of dietary fats and fiber. Flowsheet Row Pulmonary Rehab from 08/31/2016 in [facility] Cardiac and Pulmonary Rehab  Date  06/08/16  Educator  CR  Instruction Review Code  2- meets goals/outcomes      Controlling Sodium/Reading Food Labels: -Group verbal and written material supporting the discussion of sodium use in heart healthy nutrition. Review and explanation with models, verbal and written materials for utilization of the food label. Flowsheet Row Pulmonary Rehab from 08/31/2016 in [facility] Cardiac and Pulmonary Rehab  Date  06/15/16  Educator  CR  Instruction Review Code  2- meets goals/outcomes      Exercise Physiology & Risk Factors: - Group verbal and written instruction with models to review the exercise physiology of the cardiovascular system and associated critical values. Details cardiovascular disease risk factors and the goals associated with each risk factor. Flowsheet Row Pulmonary Rehab from 08/31/2016 in [facility] Cardiac and Pulmonary Rehab  Date  07/08/16  Educator  AS  Instruction Review Code  2- meets goals/outcomes      Aerobic Exercise & Resistance Training: - Gives group verbal and written discussion on the health impact of inactivity. On the components of aerobic and resistive training programs and the benefits of this training and how to safely progress through these programs. Flowsheet Row Pulmonary Rehab from 08/31/2016 in [facility] Cardiac and Pulmonary Rehab  Date  08/05/16  Educator  Alberteen Sam  Instruction Review Code  2- meets goals/outcomes      Flexibility, Balance, General Exercise Guidelines: - Provides group verbal and written instruction on the benefits of flexibility and balance training programs. Provides general exercise guidelines with specific guidelines to those with heart or lung disease. Demonstration and skill practice  provided. Flowsheet Row Pulmonary Rehab from 08/31/2016 in [facility] Cardiac and Pulmonary Rehab  Date  06/10/16  Educator  AS  Instruction Review Code  2- meets goals/outcomes      Stress Management: - Provides group verbal and written instruction about the health risks of elevated stress, cause of high stress, and healthy ways to reduce stress.   Depression: - Provides group verbal and written instruction on the correlation between heart/lung disease and depressed mood, treatment options, and the stigmas associated with seeking treatment.   Exercise & Equipment Safety: - Individual verbal instruction and demonstration of equipment use and safety with use of the equipment. Flowsheet Row Pulmonary Rehab from 08/31/2016 in [facility] Cardiac and Pulmonary Rehab  Date  06/08/16  Educator  [place]  Instruction Review Code  2- meets goals/outcomes      Infection Prevention: - Provides verbal and written material to individual with discussion of infection control including proper hand washing and proper equipment cleaning during exercise session. Flowsheet Row Pulmonary Rehab from 08/31/2016 in [facility] Cardiac and Pulmonary Rehab  Date  06/08/16  Educator  [place]  Instruction Review Code  2- meets goals/outcomes      Falls Prevention: - Provides verbal and written material to individual with discussion of falls prevention and safety. Flowsheet Row Pulmonary Rehab from 08/31/2016 in [facility] Cardiac and Pulmonary Rehab  Date  05/25/16  Educator  [place]  Instruction Review Code  2- meets goals/outcomes      Diabetes: - Individual verbal and written instruction to review signs/symptoms of diabetes, desired ranges of glucose level fasting, after meals and with exercise. Advice that pre and post exercise glucose checks will be done for 3 sessions at entry of program.   Chronic Lung Diseases: - Group verbal and written instruction to review new updates, new respiratory medications, new advancements in  procedures and treatments. Provide informative websites and "800" numbers of self-education. Flowsheet Row Pulmonary Rehab from 08/31/2016 in [facility] Cardiac and Pulmonary Rehab  Date  08/31/16  Educator  LB  Instruction Review Code  2- meets goals/outcomes      Lung Procedures: - Group verbal and written instruction to describe testing methods done to diagnose lung disease. Review the outcome of test results. Describe the treatment choices: Pulmonary Function Tests, ABGs and oximetry.   Energy Conservation: - Provide group verbal and written instruction for methods to conserve energy, plan and organize activities. Instruct on pacing techniques, use of adaptive equipment and posture/positioning to relieve shortness of breath.   Triggers: - Group verbal and written instruction to review types of environmental controls: home humidity, furnaces, filters, dust mite/pet prevention, HEPA vacuums. To discuss weather changes, air quality and the benefits of nasal washing. Flowsheet Row Pulmonary Rehab from 08/31/2016 in [facility] Cardiac and Pulmonary Rehab  Date  07/27/16  Educator  LB  Instruction Review Code  2- meets goals/outcomes      Exacerbations: - Group verbal and written instruction to provide: warning signs, infection symptoms, calling MD promptly, preventive modes, and value of vaccinations. Review: effective airway clearance, coughing and/or vibration techniques. Create an Sports administrator. Flowsheet Row Pulmonary Rehab from 08/31/2016 in [facility] Cardiac and Pulmonary Rehab  Date  06/29/16  Educator  LB  Instruction Review Code  2- meets goals/outcomes      Oxygen: - Individual and group verbal and written instruction on oxygen therapy. Includes supplement oxygen, available portable oxygen systems, continuous and intermittent flow rates, oxygen safety, concentrators, and Medicare reimbursement for oxygen.   Respiratory Medications: - Group verbal and written instruction to review  medications for lung disease. Drug class, frequency, complications, importance of spacers, rinsing mouth after steroid MDI's, and proper cleaning methods for nebulizers.   AED/CPR: - Group verbal and written instruction with the use of models to demonstrate the basic use of the AED with the basic ABC's of resuscitation. Flowsheet Row Pulmonary Rehab from 08/31/2016 in [facility] Cardiac and Pulmonary Rehab  Date  06/19/16  Educator  CE  Instruction Review Code  2- meets goals/outcomes      Breathing Retraining: - Provides individuals verbal and written instruction on purpose, frequency, and proper technique of diaphragmatic breathing and pursed-lipped breathing. Applies individual practice skills. Flowsheet Row Pulmonary Rehab from 08/31/2016 in [facility] Cardiac and Pulmonary Rehab  Date  06/08/16  Educator  [facility]  Instruction Review Code  2- meets goals/outcomes      Anatomy and Physiology of the Lungs: - Group verbal and written instruction with the use of models to provide basic lung anatomy and physiology related to function, structure and complications of lung disease. Flowsheet Row  Pulmonary Rehab from 08/31/2016 in [facility] Cardiac and Pulmonary Rehab  Date  07/10/16  Educator  [place]  Instruction Review Code  2- meets goals/outcomes      Heart Failure: - Group verbal and written instruction on the basics of heart failure: signs/symptoms, treatments, explanation of ejection fraction, enlarged heart and cardiomyopathy. Flowsheet Row Pulmonary Rehab from 08/31/2016 in [facility] Cardiac and Pulmonary Rehab  Date  07/03/16  Educator  CE [Know your numbers]  Instruction Review Code  2- meets goals/outcomes      Sleep Apnea: - Individual verbal and written instruction to review Obstructive Sleep Apnea. Review of risk factors, methods for diagnosing and types of masks and machines for OSA.   Anxiety: - Provides group, verbal and written instruction on the correlation between heart/lung disease and  anxiety, treatment options, and management of anxiety.   Relaxation: - Provides group, verbal and written instruction about the benefits of relaxation for patients with heart/lung disease. Also provides patients with examples of relaxation techniques. Flowsheet Row Pulmonary Rehab from 08/31/2016 in [facility] Cardiac and Pulmonary Rehab  Date  06/24/16  Educator  [facility]  Instruction Review Code  2- Meets goals/outcomes      Knowledge Questionnaire Score:     Knowledge Questionnaire Score - 08/25/16 3354      Knowledge Questionnaire Score   Post Score 10/10       Core Components/Risk Factors/Patient Goals at Admission:     Personal Goals and Risk Factors at Admission - 05/25/16 1628      Core Components/Risk Factors/Patient Goals on Admission    Weight Management Yes;Obesity   Admit Weight 153 lb 4.8 oz (69.5 kg)   Goal Weight: Short Term 145 lb (65.8 kg)   Goal Weight: Long Term 130 lb (59 kg)   Expected Outcomes Short Term: Continue to assess and modify interventions until short term weight is achieved;Weight Loss: Understanding of general recommendations for a balanced deficit meal plan, which promotes 1-2 lb weight loss per week and includes a negative energy balance of [phone] kcal/d   Increase Strength and Stamina Yes   Intervention Provide advice, education, support and counseling about physical activity/exercise needs.;Develop an individualized exercise prescription for aerobic and resistive training based on initial evaluation findings, risk stratification, comorbidities and participant's personal goals.   Expected Outcomes Achievement of increased cardiorespiratory fitness and enhanced flexibility, muscular endurance and strength shown through measurements of functional capacity and personal statement of participant.   Improve shortness of breath with ADL's Yes   Intervention Provide education, individualized exercise plan and daily activity instruction to help decrease symptoms  of SOB with activities of daily living.   Expected Outcomes Short Term: Achieves a reduction of symptoms when performing activities of daily living.   Develop more efficient breathing techniques such as purse lipped breathing and diaphragmatic breathing; and practicing self-pacing with activity Yes  HAs several inhalers. Stated understanding of use and need to rinse with steriod inhaler.   Intervention Provide education, demonstration and support about specific breathing techniuqes utilized for more efficient breathing. Include techniques such as pursed lipped breathing, diaphragmatic breathing and self-pacing activity.   Expected Outcomes Short Term: Participant will be able to demonstrate and use breathing techniques as needed throughout daily activities.   Hypertension Yes   Intervention Provide education on lifestyle modifcations including regular physical activity/exercise, weight management, moderate sodium restriction and increased consumption of fresh fruit, vegetables, and low fat dairy, alcohol moderation, and smoking cessation.;Monitor prescription use compliance.   Expected Outcomes Short Term:  Continued assessment and intervention until BP is < 140/33m HG in hypertensive participants. < 130/838mHG in hypertensive participants with diabetes, heart failure or chronic kidney disease.;Long Term: Maintenance of blood pressure at goal levels.   Lipids Yes   Intervention Provide education and support for participant on nutrition & aerobic/resistive exercise along with prescribed medications to achieve LDL <7023mHDL >26m72m Expected Outcomes Short Term: Participant states understanding of desired cholesterol values and is compliant with medications prescribed. Participant is following exercise prescription and nutrition guidelines.;Long Term: Cholesterol controlled with medications as prescribed, with individualized exercise RX and with personalized nutrition plan. Value goals: LDL < 70mg38mL >  40 mg.      Core Components/Risk Factors/Patient Goals Review:      Goals and Risk Factor Review    Row Name 06/08/16 1122 06/10/16 1306 06/12/16 1458 06/12/16 1504 06/15/16 1040     Core Components/Risk Factors/Patient Goals Review   Personal Goals Review Develop more efficient breathing techniques such as purse lipped breathing and diaphragmatic breathing and practicing self-pacing with activity. Weight Management/Obesity;Hypertension Increase knowledge of respiratory medications and ability to use respiratory devices properly. Improve shortness of breath with ADL's Sedentary;Increase Strength and Stamina   Review Pursed lip breathing techniques were reviewed with the patient. She demonstrated understanding of pursed lip breathing and was able to use this breathing technique during her exercise today to help control shortness of breath. [facility] risk factors.  Today is her 2nd session, so I reviewed the basics of keeping control with her medication, nutrition and exercise. She stated she was not sure if she wanted to meet with the RD. Explained, that the RD evaluation can help balance her nutrition n plan. control.   Reviewed Mr. ClarkLivolsications with her today.  She knows when and how to take them.  She also has an aerochamber to use with her MDI's. Ms. ClarkHurtubise that she already feels stronger after her first week of exercise.   DorisAnalisiaff to a good start in rehab.  She is already feeling stronger and seems to have an increase in stamina.  She is doing the weights and stretches at home!   Expected Outcomes Patient will continue to use pursed lip breathing techniques to control SOB during exercise in order to be able to continue to progress and increase in strength and stamina. Continued exercise, nutrion and medication use to help with Risk Factor control.  Obtain appoinment with thte RD to see if there are any nutrition changes she can make to help  with weight control.  This knowledge should help keep inflamation and excerbations to a minimum . Continuing to exercise should help decrease her SOB and increase her ability to preform ADLs.  DorisKellina continue to come to exercise and education.  We will discuss a home exercise program soon.   [place] 07/03/16 1353 07/03/16 1359 07/03/16 1401 07/06/16 1043 08/05/16 1258     Core Components/Risk Factors/Patient Goals Review   Personal Goals Review Increase knowledge of respiratory medications and ability to use respiratory devices properly. Develop more efficient breathing techniques such as purse lipped breathing and diaphragmatic breathing and practicing self-pacing with activity. Improve shortness of breath with ADL's Sedentary;Increase Strength and Stamina Weight Management/Obesity;Hypertension;Increase Strength and Stamina;Sedentary   Review Ms. Monnin takes Proventil, Spiriva, Breo Ellipta, and Albuterol for her respiratory issues.  She has been taking them for a while and knows the proper techiques on how  to take all the medications. She has a spacer at home for the Proventil.  She says that the Memory Dance has helped to most with her SOB since she started taking it.  Ms. Starrett show proper technique for PLB when she is SOB and exercising. Ms. Guevarra is having less SOB and is finding it easier to walk longer distances and when taking the stairs.  She is having less shuffling of her feet and less stumbling. Ms. Philipp plans to start some home exercise soon, but has already been consistantly coming to class. She reports walking better with more confidence. She also stated that her strength and stamina are imporving.  Clora's weight has been steady and her blood pressures were good too.  She feels like exercise has "been like a miracle" as she is feeling so much better with her strength and stamina.  She is able to do more around the house.  She is also walking 2 days a week at home for 30 min!!   Expected Outcomes  If Tia continues to take her medicatons as scheduled and when needed, this should help decrease exacerbations and SOB for her.  The technique should help her recover from SOB quicker and help her if she is having anxiety or exacerbations.  Continued exercise should help Ms. Lor increase her muscle strength and lung health as she progresses in the program.  Ms. Rembold will soon begin adding home exercise to her routine which will increase her frequency of exercise and continue to aid in strength and stamina improvments.  Kaley will continue to come to exercise and education classses to work on strength and stamina and risk factor modification.  We will continue to monitor her progression.   [place] Name 08/07/16 1502 08/07/16 1504 08/07/16 1506 08/31/16 1114 09/02/16 1510     Core Components/Risk Factors/Patient Goals Review   Personal Goals Review Increase knowledge of respiratory medications and ability to use respiratory devices properly. Improve shortness of breath with ADL's Develop more efficient breathing techniques such as purse lipped breathing and diaphragmatic breathing and practicing self-pacing with activity. Weight Management/Obesity;Increase Strength and Stamina;Hypertension Increase Strength and Stamina;Develop more efficient breathing techniques such as purse lipped breathing and diaphragmatic breathing and practicing self-pacing with activity.;Improve shortness of breath with ADL's;Increase knowledge of respiratory medications and ability to use respiratory devices properly.   Review Ms. Decoster takes Proventil, Spiriva, Breo and Albuterol SVNs at home.  She has a spacer for her MDIs.   Ms. Kurka says that everything is easier for her to do since she has been exercising.  She that her muscles are stronger and she is shuffeling her feel less. Ms. Commerford demonstrates proper technique of PLB. Followed up with [name] about her weight, hypertension and strength and stamina goals. Her weight has been  maintained and her blood pressure has been maintianed in acceptable ranges. She reported improvement in ADLs and has been able to go to the store and vacuum without SOB. She also reported being able to better tolerate going up and down stairs.  Ms Bless will be graduating soon. She has a good understanding of her MDI's and PLB. She states she has become more independent which her family has noticed the difference. Ms Poland looks forward to continuing her exercise at [employer] and will continue her strength  training at home with her newly purchased weights.    Expected Outcomes Taking her respiratory medication as prescribed by the doctor will help decrease her SOB and chances of  exacerbations.  Continued strenght training and cardiovascular exercise will help improve Ms. Clarks SOB and stamina during ADLs.  PLB will help Ms. Buswell regain her breath during exercise.  Ms. Soldo will continue her consistant attendance to class and continue to follow her exercise prescription which will help her progress towards contiued improvements in strength and stamina, weight managment, and controlled blood pressures.  Continue Exercising and managing her Asthma and Mild COPD for quality of life.      Core Components/Risk Factors/Patient Goals at Discharge (Final Review):      Goals and Risk Factor Review - 09/02/16 1510      Core Components/Risk Factors/Patient Goals Review   Personal Goals Review Increase Strength and Stamina;Develop more efficient breathing techniques such as purse lipped breathing and diaphragmatic breathing and practicing self-pacing with activity.;Improve shortness of breath with ADL's;Increase knowledge of respiratory medications and ability to use respiratory devices properly.   Review Ms Urquilla will be graduating soon. She has a good understanding of her MDI's and PLB. She states she has become more independent which her family has noticed the difference. Ms Benninger looks forward to  continuing her exercise at [employer] and will continue her strength  training at home with her newly purchased weights.    Expected Outcomes Continue Exercising and managing her Asthma and Mild COPD for quality of life.      ITP Comments:     ITP Comments    Row Name 05/25/16 1605 08/12/16 1224         ITP Comments Medical Review completed today with ITP. Continue with ITP Chart documentation sent for [employer]. Maday is progressing well with exercise.         Comments:  Ms Daniah Zaldivar graduated today from pulmonary rehab with 36 sessions completed.  Details of the patient's exercise prescription and what she needs to do in order to continue the prescription and progress were discussed with patient.  Patient was given a copy of prescription and goals.  Patient verbalized understanding.  Ms Valtierra plans to continue to exercise by joining [employer] and exercise with her home weights.

## 2016-09-11 NOTE — Patient Instructions (Signed)
Discharge Instructions  Patient Details  Name: Lori Brady MRN: [id number] Date of Birth: 1945/12/29 Referring Provider:  Lavera Guise, MD   Number of Visits: 36 Reason for Discharge:  Patient reached a stable level of exercise. Patient independent in their exercise.  Smoking History:  History  Smoking Status   Former Smoker   Packs/day: 1.00   Years: 30.00   Types: Cigarettes   Quit date: 08/01/2008  Smokeless Tobacco   Never Used    Diagnosis:  COPD, mild ([place])  Initial Exercise Prescription:     Initial Exercise Prescription - 05/25/16 1500      Date of Initial Exercise RX and Referring Provider   Date 05/25/16   Referring Provider [name], MD.     Treadmill   MPH 1   Grade 0   Minutes 5   METs 2     Recumbant Bike   Level 2   RPM 50   Minutes 15   METs 2     NuStep   Level 2   Minutes 15   METs 2.5     Biostep-RELP   Level 2   Minutes 15   METs 2.5     Prescription Details   Frequency (times per week) 3   Duration Progress to 30 minutes of continuous aerobic without signs/symptoms of physical distress     Intensity   THRR 40-80% of Max Heartrate 113-140   Ratings of Perceived Exertion 11-13   Perceived Dyspnea 0-4     Progression   Progression Continue progressive overload as per policy without signs/symptoms or physical distress.     Resistance Training   Training Prescription Yes   Weight 2   Reps 10-12      Discharge Exercise Prescription (Final Exercise Prescription Changes):     Exercise Prescription Changes - 09/10/16 1100      Exercise Review   Progression Yes     Response to Exercise   Blood Pressure (Admit) 108/60   Blood Pressure (Exercise) 142/72   Blood Pressure (Exit) 124/74   Heart Rate (Admit) 103 bpm   Heart Rate (Exercise) 128 bpm   Heart Rate (Exit) 98 bpm   Oxygen Saturation (Admit) 97 %   Oxygen Saturation (Exercise) 96 %   Oxygen Saturation (Exit) 96 %   Rating of Perceived Exertion  (Exercise) 14   Perceived Dyspnea (Exercise) 2   Symptoms none   Duration Progress to 45 minutes of aerobic exercise without signs/symptoms of physical distress   Intensity THRR unchanged     Progression   Progression Continue to progress workloads to maintain intensity without signs/symptoms of physical distress.     Resistance Training   Training Prescription Yes   Weight 4   Reps 10-15     Interval Training   Interval Training No     Treadmill   MPH 1.8   Grade 1   Minutes 15   METs 2.6     Recumbant Bike   Level 5   RPM 40   Minutes 15   METs 2     NuStep   Level 5   Minutes 15   METs 1.7      Functional Capacity:     6 Minute Walk    Row Name 05/25/16 1539 07/15/16 1145 08/24/16 1259     6 Minute Walk   Phase Initial Mid Program Discharge   Distance 945 feet 1125 feet 110 feet   Distance % Change  -- 19 %  16 %   Walk Time 6 minutes 6 minutes 6 minutes   # of Rest Breaks 0 0 0   MPH 1.79 2.13 2.08   METS 2.37 3.2 3.16   RPE 11 14 13    Perceived Dyspnea  2 3 2    VO2 Peak 8.3 11.24 11.05   Symptoms No No No   Resting HR 88 bpm 97 bpm 102 bpm   Resting BP 142/80 120/60 118/60   Max Ex. HR 115 bpm 126 bpm 124 bpm   Max Ex. BP 158/82 164/72 116/64   2 Minute Post BP 138/82  --  --     Interval HR   Baseline HR 88 97 102   1 Minute HR 100 107 115   2 Minute HR 111 117 120   3 Minute HR 115 121 122   4 Minute HR 115 122 124   5 Minute HR 113 126 123   6 Minute HR 114 120 124   2 Minute Post HR 95 111 101   Interval Heart Rate? Yes  -- Yes     Interval Oxygen   Interval Oxygen? Yes  -- Yes   Baseline Oxygen Saturation % 96 % 97 % 98 %   Baseline Liters of Oxygen 0 L  -- 0 L   1 Minute Oxygen Saturation % 94 % 92 % 96 %   1 Minute Liters of Oxygen 0 L  -- 0 L   2 Minute Oxygen Saturation % 94 % 94 % 96 %   2 Minute Liters of Oxygen 0 L  -- 0 L   3 Minute Oxygen Saturation % 94 % 96 % 96 %   3 Minute Liters of Oxygen 0 L  -- 0 L   4 Minute  Oxygen Saturation % 94 % 95 % 96 %   4 Minute Liters of Oxygen 0 L  -- 0 L   5 Minute Oxygen Saturation % 95 % 95 % 96 %   5 Minute Liters of Oxygen 0 L  -- 0 L   6 Minute Oxygen Saturation % 95 % 94 % 96 %   6 Minute Liters of Oxygen 0 L  -- 0 L   2 Minute Post Oxygen Saturation % 96 % 96 % 96 %   2 Minute Post Liters of Oxygen 0 L  -- 0 L      Quality of Life:     Quality of Life - 08/25/16 0645      Quality of Life Scores   Health/Function Pre 18.13 %   Health/Function Post 20.4 %   Health/Function % Change 12.52 %   Socioeconomic Pre 20 %   Socioeconomic Post 20.33 %   Socioeconomic % Change  1.65 %   Psych/Spiritual Pre 19.67 %   Psych/Spiritual Post 20.43 %   Psych/Spiritual % Change 3.86 %   Family Pre 21 %   Family Post 20.75 %   Family % Change -1.19 %   GLOBAL Pre 19.19 %   GLOBAL Post 20.44 %   GLOBAL % Change 6.51 %      Personal Goals: Goals established at orientation with interventions provided to work toward goal.     Personal Goals and Risk Factors at Admission - 05/25/16 1628      Core Components/Risk Factors/Patient Goals on Admission    Weight Management Yes;Obesity   Admit Weight 153 lb 4.8 oz (69.5 kg)   Goal Weight: Short Term 145 lb (65.8  kg)   Goal Weight: Long Term 130 lb (59 kg)   Expected Outcomes Short Term: Continue to assess and modify interventions until short term weight is achieved;Weight Loss: Understanding of general recommendations for a balanced deficit meal plan, which promotes 1-2 lb weight loss per week and includes a negative energy balance of [phone] kcal/d   Increase Strength and Stamina Yes   Intervention Provide advice, education, support and counseling about physical activity/exercise needs.;Develop an individualized exercise prescription for aerobic and resistive training based on initial evaluation findings, risk stratification, comorbidities and participant's personal goals.   Expected Outcomes Achievement of increased  cardiorespiratory fitness and enhanced flexibility, muscular endurance and strength shown through measurements of functional capacity and personal statement of participant.   Improve shortness of breath with ADL's Yes   Intervention Provide education, individualized exercise plan and daily activity instruction to help decrease symptoms of SOB with activities of daily living.   Expected Outcomes Short Term: Achieves a reduction of symptoms when performing activities of daily living.   Develop more efficient breathing techniques such as purse lipped breathing and diaphragmatic breathing; and practicing self-pacing with activity Yes  HAs several inhalers. Stated understanding of use and need to rinse with steriod inhaler.   Intervention Provide education, demonstration and support about specific breathing techniuqes utilized for more efficient breathing. Include techniques such as pursed lipped breathing, diaphragmatic breathing and self-pacing activity.   Expected Outcomes Short Term: Participant will be able to demonstrate and use breathing techniques as needed throughout daily activities.   Hypertension Yes   Intervention Provide education on lifestyle modifcations including regular physical activity/exercise, weight management, moderate sodium restriction and increased consumption of fresh fruit, vegetables, and low fat dairy, alcohol moderation, and smoking cessation.;Monitor prescription use compliance.   Expected Outcomes Short Term: Continued assessment and intervention until BP is < 140/93m HG in hypertensive participants. < 130/876mHG in hypertensive participants with diabetes, heart failure or chronic kidney disease.;Long Term: Maintenance of blood pressure at goal levels.   Lipids Yes   Intervention Provide education and support for participant on nutrition & aerobic/resistive exercise along with prescribed medications to achieve LDL <7034mHDL >11m63m Expected Outcomes Short Term:  Participant states understanding of desired cholesterol values and is compliant with medications prescribed. Participant is following exercise prescription and nutrition guidelines.;Long Term: Cholesterol controlled with medications as prescribed, with individualized exercise RX and with personalized nutrition plan. Value goals: LDL < 70mg41mL > 40 mg.       Personal Goals Discharge:     Goals and Risk Factor Review - 09/02/16 1510      Core Components/Risk Factors/Patient Goals Review   Personal Goals Review Increase Strength and Stamina;Develop more efficient breathing techniques such as purse lipped breathing and diaphragmatic breathing and practicing self-pacing with activity.;Improve shortness of breath with ADL's;Increase knowledge of respiratory medications and ability to use respiratory devices properly.   Review Ms ClarkAckroyd be graduating soon. She has a good understanding of her MDI's and PLB. She states she has become more independent which her family has noticed the difference. Ms ClarkKovacichs forward to continuing [facility] [employer] continue her strength  training at home with her newly purchased weights.    Expected Outcomes Continue Exercising and managing her Asthma and Mild COPD for quality of life.      Nutrition & Weight - Outcomes:     Pre Biometrics - 05/25/16 1545      Pre Biometrics  Height 5' 3"  (1.6 m)   Weight 153 lb 4.8 oz (69.5 kg)   Waist Circumference 36.5 inches   Hip Circumference 41 inches   Waist to Hip Ratio 0.89 %   BMI (Calculated) 27.2         Post Biometrics - 08/24/16 1307       Post  Biometrics   Height 5' 3"  (1.6 m)   Weight 153 lb 3.2 oz (69.5 kg)   Waist Circumference 36 inches   Hip Circumference 42 inches   Waist to Hip Ratio 0.86 %   BMI (Calculated) 27.2      Nutrition:     Nutrition Therapy & Goals - 05/25/16 1627      Intervention Plan   Intervention Prescribe, educate and counsel regarding  individualized specific dietary modifications aiming towards targeted core components such as weight, hypertension, lipid management, diabetes, heart failure and other comorbidities.   Expected Outcomes Short Term Goal: Understand basic principles of dietary content, such as calories, fat, sodium, cholesterol and nutrients.;Short Term Goal: A plan has been developed with personal nutrition goals set during dietitian appointment.;Long Term Goal: Adherence to prescribed nutrition plan.      Nutrition Discharge:   Education Questionnaire Score:     Knowledge Questionnaire Score - 08/25/16 9409      Knowledge Questionnaire Score   Post Score 10/10      Goals reviewed with patient; copy given to patient.

## 2016-10-08 ENCOUNTER — Inpatient Hospital Stay
Admission: EM | Admit: 2016-10-08 | Discharge: 2016-10-13 | DRG: 871 | Disposition: A | Discharge status: Home / Self Care | Payer: Medicare Other | Attending: Internal Medicine | Admitting: Internal Medicine

## 2016-10-08 ENCOUNTER — Inpatient Hospital Stay: Payer: Medicare Other

## 2016-10-08 ENCOUNTER — Other Ambulatory Visit: Payer: Self-pay

## 2016-10-08 ENCOUNTER — Encounter: Payer: Self-pay | Admitting: Emergency Medicine

## 2016-10-08 ENCOUNTER — Emergency Department: Payer: Medicare Other

## 2016-10-08 DIAGNOSIS — Z8249 Family history of ischemic heart disease and other diseases of the circulatory system: Secondary | ICD-10-CM | POA: Diagnosis not present

## 2016-10-08 DIAGNOSIS — J440 Chronic obstructive pulmonary disease with acute lower respiratory infection: Secondary | ICD-10-CM | POA: Diagnosis present

## 2016-10-08 DIAGNOSIS — E860 Dehydration: Secondary | ICD-10-CM | POA: Diagnosis present

## 2016-10-08 DIAGNOSIS — I252 Old myocardial infarction: Secondary | ICD-10-CM

## 2016-10-08 DIAGNOSIS — I2510 Atherosclerotic heart disease of native coronary artery without angina pectoris: Secondary | ICD-10-CM | POA: Diagnosis present

## 2016-10-08 DIAGNOSIS — I10 Essential (primary) hypertension: Secondary | ICD-10-CM | POA: Diagnosis present

## 2016-10-08 DIAGNOSIS — J189 Pneumonia, unspecified organism: Secondary | ICD-10-CM | POA: Diagnosis present

## 2016-10-08 DIAGNOSIS — M810 Age-related osteoporosis without current pathological fracture: Secondary | ICD-10-CM | POA: Diagnosis present

## 2016-10-08 DIAGNOSIS — N170 Acute kidney failure with tubular necrosis: Secondary | ICD-10-CM | POA: Diagnosis present

## 2016-10-08 DIAGNOSIS — N390 Urinary tract infection, site not specified: Secondary | ICD-10-CM | POA: Diagnosis present

## 2016-10-08 DIAGNOSIS — Z79899 Other long term (current) drug therapy: Secondary | ICD-10-CM | POA: Diagnosis not present

## 2016-10-08 DIAGNOSIS — Z7983 Long term (current) use of bisphosphonates: Secondary | ICD-10-CM

## 2016-10-08 DIAGNOSIS — Z8673 Personal history of transient ischemic attack (TIA), and cerebral infarction without residual deficits: Secondary | ICD-10-CM

## 2016-10-08 DIAGNOSIS — R112 Nausea with vomiting, unspecified: Secondary | ICD-10-CM | POA: Diagnosis present

## 2016-10-08 DIAGNOSIS — N179 Acute kidney failure, unspecified: Secondary | ICD-10-CM

## 2016-10-08 DIAGNOSIS — E876 Hypokalemia: Secondary | ICD-10-CM | POA: Diagnosis present

## 2016-10-08 DIAGNOSIS — J9601 Acute respiratory failure with hypoxia: Secondary | ICD-10-CM | POA: Diagnosis not present

## 2016-10-08 DIAGNOSIS — J45909 Unspecified asthma, uncomplicated: Secondary | ICD-10-CM | POA: Diagnosis not present

## 2016-10-08 DIAGNOSIS — Z7982 Long term (current) use of aspirin: Secondary | ICD-10-CM

## 2016-10-08 DIAGNOSIS — K219 Gastro-esophageal reflux disease without esophagitis: Secondary | ICD-10-CM | POA: Diagnosis present

## 2016-10-08 DIAGNOSIS — E7800 Pure hypercholesterolemia, unspecified: Secondary | ICD-10-CM | POA: Diagnosis present

## 2016-10-08 DIAGNOSIS — J181 Lobar pneumonia, unspecified organism: Secondary | ICD-10-CM

## 2016-10-08 DIAGNOSIS — R6521 Severe sepsis with septic shock: Secondary | ICD-10-CM | POA: Diagnosis present

## 2016-10-08 DIAGNOSIS — J441 Chronic obstructive pulmonary disease with (acute) exacerbation: Secondary | ICD-10-CM | POA: Diagnosis not present

## 2016-10-08 DIAGNOSIS — Z87891 Personal history of nicotine dependence: Secondary | ICD-10-CM

## 2016-10-08 DIAGNOSIS — Z833 Family history of diabetes mellitus: Secondary | ICD-10-CM

## 2016-10-08 DIAGNOSIS — Z881 Allergy status to other antibiotic agents status: Secondary | ICD-10-CM

## 2016-10-08 DIAGNOSIS — K5090 Crohn's disease, unspecified, without complications: Secondary | ICD-10-CM | POA: Diagnosis present

## 2016-10-08 DIAGNOSIS — M1990 Unspecified osteoarthritis, unspecified site: Secondary | ICD-10-CM | POA: Diagnosis present

## 2016-10-08 DIAGNOSIS — Z803 Family history of malignant neoplasm of breast: Secondary | ICD-10-CM | POA: Diagnosis not present

## 2016-10-08 DIAGNOSIS — Z885 Allergy status to narcotic agent status: Secondary | ICD-10-CM

## 2016-10-08 DIAGNOSIS — A419 Sepsis, unspecified organism: Secondary | ICD-10-CM

## 2016-10-08 DIAGNOSIS — Z7951 Long term (current) use of inhaled steroids: Secondary | ICD-10-CM | POA: Diagnosis not present

## 2016-10-08 HISTORY — DX: Sepsis, unspecified organism: A41.9

## 2016-10-08 HISTORY — DX: Severe sepsis with septic shock: R65.21

## 2016-10-08 LAB — URINALYSIS COMPLETE WITH MICROSCOPIC (ARMC ONLY)
Bilirubin Urine: NEGATIVE
Glucose, UA: NEGATIVE mg/dL
KETONES UR: NEGATIVE mg/dL
LEUKOCYTES UA: NEGATIVE
NITRITE: NEGATIVE
PH: 6 (ref 5.0–8.0)
PROTEIN: 30 mg/dL — AB
SPECIFIC GRAVITY, URINE: 1.01 (ref 1.005–1.030)

## 2016-10-08 LAB — CBC WITH DIFFERENTIAL/PLATELET
BAND NEUTROPHILS: 26 %
BASOS ABS: 0 10*3/uL (ref 0–0.1)
BASOS PCT: 0 %
BLASTS: 0 %
EOS ABS: 0.3 10*3/uL (ref 0–0.7)
Eosinophils Relative: 1 %
HEMATOCRIT: 34.1 % — AB (ref 35.0–47.0)
HEMOGLOBIN: 11.6 g/dL — AB (ref 12.0–16.0)
Lymphocytes Relative: 3 %
Lymphs Abs: 0.9 10*3/uL — ABNORMAL LOW (ref 1.0–3.6)
MCH: 31.9 pg (ref 26.0–34.0)
MCHC: 34 g/dL (ref 32.0–36.0)
MCV: 93.8 fL (ref 80.0–100.0)
METAMYELOCYTES PCT: 1 %
MONO ABS: 2.8 10*3/uL — AB (ref 0.2–0.9)
Monocytes Relative: 9 %
Myelocytes: 0 %
Neutro Abs: 27.5 10*3/uL — ABNORMAL HIGH (ref 1.4–6.5)
Neutrophils Relative %: 60 %
Other: 0 %
PROMYELOCYTES ABS: 0 %
Platelets: 150 10*3/uL (ref 150–440)
RBC: 3.63 MIL/uL — ABNORMAL LOW (ref 3.80–5.20)
RDW: 12.7 % (ref 11.5–14.5)
WBC: 31.5 10*3/uL — ABNORMAL HIGH (ref 3.6–11.0)
nRBC: 1 /100 WBC — ABNORMAL HIGH

## 2016-10-08 LAB — INFLUENZA PANEL BY PCR (TYPE A & B)
Influenza A By PCR: NEGATIVE
Influenza B By PCR: NEGATIVE

## 2016-10-08 LAB — COMPREHENSIVE METABOLIC PANEL
ALBUMIN: 3.5 g/dL (ref 3.5–5.0)
ALK PHOS: 43 U/L (ref 38–126)
ALT: 69 U/L — ABNORMAL HIGH (ref 14–54)
ANION GAP: 16 — AB (ref 5–15)
AST: 91 U/L — AB (ref 15–41)
BILIRUBIN TOTAL: 2.9 mg/dL — AB (ref 0.3–1.2)
BUN: 32 mg/dL — AB (ref 6–20)
CO2: 21 mmol/L — AB (ref 22–32)
Calcium: 8.6 mg/dL — ABNORMAL LOW (ref 8.9–10.3)
Chloride: 94 mmol/L — ABNORMAL LOW (ref 101–111)
Creatinine, Ser: 2.96 mg/dL — ABNORMAL HIGH (ref 0.44–1.00)
GFR calc Af Amer: 18 mL/min — ABNORMAL LOW (ref >60)
GFR calc non Af Amer: 15 mL/min — ABNORMAL LOW (ref >60)
GLUCOSE: 116 mg/dL — AB (ref 65–99)
POTASSIUM: 3.1 mmol/L — AB (ref 3.5–5.1)
SODIUM: 131 mmol/L — AB (ref 135–145)
TOTAL PROTEIN: 6.8 g/dL (ref 6.5–8.1)

## 2016-10-08 LAB — LACTIC ACID, PLASMA
LACTIC ACID, VENOUS: 5.1 mmol/L — AB (ref 0.5–1.9)
Lactic Acid, Venous: 2.4 mmol/L (ref 0.5–1.9)

## 2016-10-08 LAB — TROPONIN I: TROPONIN I: 0.03 ng/mL — AB (ref <0.03)

## 2016-10-08 LAB — MRSA PCR SCREENING: MRSA by PCR: NEGATIVE

## 2016-10-08 LAB — PHOSPHORUS: Phosphorus: 2.2 mg/dL — ABNORMAL LOW (ref 2.5–4.6)

## 2016-10-08 LAB — MAGNESIUM
Magnesium: 0.8 mg/dL — CL (ref 1.7–2.4)
Magnesium: 2.9 mg/dL — ABNORMAL HIGH (ref 1.7–2.4)

## 2016-10-08 LAB — POTASSIUM: POTASSIUM: 4 mmol/L (ref 3.5–5.1)

## 2016-10-08 MED ORDER — ACETAMINOPHEN 500 MG PO TABS
1000.0000 mg | ORAL_TABLET | Freq: Once | ORAL | Status: AC
  Administered 2016-10-08: 1000 mg via ORAL

## 2016-10-08 MED ORDER — CEFTRIAXONE SODIUM-DEXTROSE 1-3.74 GM-% IV SOLR
1.0000 g | Freq: Once | INTRAVENOUS | Status: AC
  Administered 2016-10-08: 1 g via INTRAVENOUS
  Filled 2016-10-08: qty 50

## 2016-10-08 MED ORDER — VANCOMYCIN HCL 10 G IV SOLR
1500.0000 mg | INTRAVENOUS | Status: AC
  Administered 2016-10-08: 1500 mg via INTRAVENOUS
  Filled 2016-10-08: qty 1500

## 2016-10-08 MED ORDER — IPRATROPIUM-ALBUTEROL 0.5-2.5 (3) MG/3ML IN SOLN
3.0000 mL | Freq: Four times a day (QID) | RESPIRATORY_TRACT | Status: DC | PRN
  Administered 2016-10-08 – 2016-10-11 (×5): 3 mL via RESPIRATORY_TRACT
  Filled 2016-10-08 (×5): qty 3

## 2016-10-08 MED ORDER — MORPHINE SULFATE (PF) 4 MG/ML IV SOLN
INTRAVENOUS | Status: AC
  Administered 2016-10-08: 2 mg via INTRAVENOUS
  Filled 2016-10-08: qty 1

## 2016-10-08 MED ORDER — VANCOMYCIN HCL IN DEXTROSE 1-5 GM/200ML-% IV SOLN: 1000.0000 mg | INTRAVENOUS | Status: DC

## 2016-10-08 MED ORDER — POTASSIUM PHOSPHATES 15 MMOLE/5ML IV SOLN
10.0000 mmol | Freq: Once | INTRAVENOUS | Status: AC
  Administered 2016-10-08: 10 mmol via INTRAVENOUS
  Filled 2016-10-08: qty 3.33

## 2016-10-08 MED ORDER — ORAL CARE MOUTH RINSE
15.0000 mL | Freq: Two times a day (BID) | OROMUCOSAL | Status: DC
  Administered 2016-10-08 – 2016-10-10 (×3): 15 mL via OROMUCOSAL

## 2016-10-08 MED ORDER — TRAMADOL HCL 50 MG PO TABS
50.0000 mg | ORAL_TABLET | Freq: Two times a day (BID) | ORAL | Status: DC | PRN
  Administered 2016-10-08 – 2016-10-13 (×3): 50 mg via ORAL
  Filled 2016-10-08 (×3): qty 1

## 2016-10-08 MED ORDER — SODIUM CHLORIDE 0.9 % IV BOLUS (SEPSIS)
1000.0000 mL | Freq: Once | INTRAVENOUS | Status: AC
  Administered 2016-10-08: 1000 mL via INTRAVENOUS

## 2016-10-08 MED ORDER — MAGNESIUM SULFATE 2 GM/50ML IV SOLN
2.0000 g | Freq: Once | INTRAVENOUS | Status: AC
  Administered 2016-10-08: 2 g via INTRAVENOUS
  Filled 2016-10-08: qty 50

## 2016-10-08 MED ORDER — PHENYLEPHRINE HCL 10 MG/ML IJ SOLN
0.0000 ug/min | INTRAVENOUS | Status: DC
  Administered 2016-10-08: 60 ug/min via INTRAVENOUS
  Administered 2016-10-08: 20 ug/min via INTRAVENOUS
  Administered 2016-10-08 – 2016-10-09 (×3): 60 ug/min via INTRAVENOUS
  Administered 2016-10-09: 10 ug/min via INTRAVENOUS
  Administered 2016-10-09: 70 ug/min via INTRAVENOUS
  Filled 2016-10-08 (×7): qty 1

## 2016-10-08 MED ORDER — MAGNESIUM SULFATE 4 GM/100ML IV SOLN
4.0000 g | Freq: Once | INTRAVENOUS | Status: AC
  Administered 2016-10-08: 4 g via INTRAVENOUS
  Filled 2016-10-08: qty 100

## 2016-10-08 MED ORDER — ACETAMINOPHEN 500 MG PO TABS
ORAL_TABLET | ORAL | Status: AC
  Administered 2016-10-08: 1000 mg via ORAL
  Filled 2016-10-08: qty 2

## 2016-10-08 MED ORDER — ENOXAPARIN SODIUM 30 MG/0.3ML ~~LOC~~ SOLN
30.0000 mg | SUBCUTANEOUS | Status: DC
  Administered 2016-10-08: 30 mg via SUBCUTANEOUS
  Filled 2016-10-08: qty 0.3

## 2016-10-08 MED ORDER — KCL IN DEXTROSE-NACL 20-5-0.9 MEQ/L-%-% IV SOLN
INTRAVENOUS | Status: DC
  Administered 2016-10-08 – 2016-10-09 (×2): via INTRAVENOUS
  Filled 2016-10-08 (×5): qty 1000

## 2016-10-08 MED ORDER — IPRATROPIUM-ALBUTEROL 0.5-2.5 (3) MG/3ML IN SOLN
3.0000 mL | Freq: Once | RESPIRATORY_TRACT | Status: AC
  Administered 2016-10-08: 3 mL via RESPIRATORY_TRACT
  Filled 2016-10-08: qty 3

## 2016-10-08 MED ORDER — SODIUM CHLORIDE 0.9% FLUSH
3.0000 mL | Freq: Two times a day (BID) | INTRAVENOUS | Status: DC
  Administered 2016-10-08 – 2016-10-13 (×10): 3 mL via INTRAVENOUS

## 2016-10-08 MED ORDER — POTASSIUM CHLORIDE CRYS ER 20 MEQ PO TBCR
40.0000 meq | EXTENDED_RELEASE_TABLET | Freq: Once | ORAL | Status: AC
  Administered 2016-10-08: 40 meq via ORAL
  Filled 2016-10-08: qty 2

## 2016-10-08 MED ORDER — DEXTROSE 5 % IV SOLN
1.0000 g | Freq: Once | INTRAVENOUS | Status: DC
  Filled 2016-10-08: qty 10

## 2016-10-08 MED ORDER — ONDANSETRON HCL 4 MG PO TABS: 4.0000 mg | ORAL_TABLET | Freq: Four times a day (QID) | ORAL | Status: DC | PRN

## 2016-10-08 MED ORDER — MORPHINE SULFATE (PF) 4 MG/ML IV SOLN
2.0000 mg | INTRAVENOUS | Status: DC | PRN
  Administered 2016-10-08 – 2016-10-09 (×6): 2 mg via INTRAVENOUS
  Filled 2016-10-08 (×5): qty 1

## 2016-10-08 MED ORDER — DOXYCYCLINE HYCLATE 100 MG PO TABS
100.0000 mg | ORAL_TABLET | Freq: Once | ORAL | Status: AC
  Administered 2016-10-08: 100 mg via ORAL
  Filled 2016-10-08: qty 1

## 2016-10-08 MED ORDER — ONDANSETRON HCL 4 MG/2ML IJ SOLN: 4.0000 mg | Freq: Four times a day (QID) | INTRAMUSCULAR | Status: DC | PRN

## 2016-10-08 MED ORDER — PIPERACILLIN-TAZOBACTAM 3.375 G IVPB 30 MIN
3.3750 g | Freq: Two times a day (BID) | INTRAVENOUS | Status: DC
  Administered 2016-10-08 – 2016-10-09 (×2): 3.375 g via INTRAVENOUS
  Filled 2016-10-08 (×4): qty 50

## 2016-10-08 NOTE — H&P (Signed)
[facility] [place] NAME: Lori Brady    MR#:  [id number]  DATE OF BIRTH:  Feb 07, 1946  DATE OF ADMISSION:  10/08/2016  PRIMARY CARE PHYSICIAN: Mar Daring, PA-C   REQUESTING/REFERRING PHYSICIAN:   CHIEF COMPLAINT:   Nausea, vomiting and diarrhea HISTORY OF PRESENT ILLNESS:  Lori Brady  is a 70 y.o. female with a known history of COPD, Crohn's disease, coronary artery disease, stroke is presenting to the ED with a chief complaint of nausea vomiting and diarrhea. Vomiting and diarrhea was nonbloody Patient reported that her symptoms started yesterday. Patient was also reporting dry cough with congestion and intermittent episodes of wheezing. Patient was also complaining of being dizzy. She was very hypotensive with systolic blood pressure in the 60s and tachycardic. Patient's creatinine is at 2.9 and WBC 30,000, magnesium 8.8. Patient was initially given IV Rocephin and doxycycline. As clinical situation was not improving being hypotensive after giving fluid boluses central line was placed. Patient is started on broad-spectrum IV antibiotics for septic shock  PAST MEDICAL HISTORY:   Past Medical History:  Diagnosis Date  . Arthritis   . Asthma   . Crohn's disease ([place])   . Emphysema of lung ([place])   . GERD (gastroesophageal reflux disease)   . History of chicken pox   . History of heart attack   . Hx of completed stroke   . Hypercholesteremia   . Hypertension   . Seasonal allergies   . UTI (lower urinary tract infection)     PAST SURGICAL HISTOIRY:   Past Surgical History:  Procedure Laterality Date  . BREAST BIOPSY Bilateral 1960's to 90's    SOCIAL HISTORY:   Social History  Substance Use Topics  . Smoking status: Former Smoker    Packs/day: 1.00    Years: 30.00    Types: Cigarettes    Quit date: 08/01/2008  . Smokeless tobacco: Never Used  . Alcohol use Yes     Comment: occasional    FAMILY HISTORY:    Family History  Problem Relation Age of Onset  . Cancer Sister     ovarian/uterine/breast  . Breast cancer Sister 51  . Cancer Sister     breast  . Heart disease Sister   . Breast cancer Sister 48  . Heart disease Father   . Cancer Father     liver  . Cancer Mother   . Heart disease Brother   . Cancer Brother     liver  . Heart disease Sister   . Diabetes Maternal Grandmother     DRUG ALLERGIES:   Allergies  Allergen Reactions  . Levaquin [Levofloxacin] Nausea Only  . Vicodin [Hydrocodone-Acetaminophen]     nausea    REVIEW OF SYSTEMS:  CONSTITUTIONAL:Reporting subjective fever,   and weakness  EYES: No blurred or double vision.  EARS, NOSE, AND THROAT: No tinnitus or ear pain.  RESPIRATORY: Reporting  cough, shortness of breath,  denies wheezing or hemoptysis.  CARDIOVASCULAR: No chest pain, orthopnea, edema.  GASTROINTESTINAL: REPORTING  nausea, vomiting, diarrhea , right-sided  abdominal pain.  GENITOURINARY: No dysuria, hematuria.  ENDOCRINE: No polyuria, nocturia,  HEMATOLOGY: No anemia, easy bruising or bleeding SKIN: No rash or lesion. MUSCULOSKELETAL: No joint pain or arthritis.   NEUROLOGIC: No tingling, numbness, weakness.  PSYCHIATRY: No anxiety or depression.   MEDICATIONS AT HOME:   Prior to Admission medications   Medication Sig Start Date End Date Taking? Authorizing Provider  albuterol (ACCUNEB)  0.63 MG/3ML nebulizer solution Take 1 ampule by nebulization every 6 (six) hours as needed for wheezing.   Yes Historical Provider, MD  alendronate (FOSAMAX) 70 MG tablet Take 1 tablet (70 mg total) by mouth once a week. Patient taking differently: Take 70 mg by mouth once a week. mondays 07/15/16  Yes Birdie Sons, MD  aspirin 81 MG tablet Take 81 mg by mouth daily.   Yes Historical Provider, MD  betamethasone valerate (VALISONE) 0.1 % cream APPLY TOPICALLY TO AFFECTED AREA SPARINGLY 2 TIMES DAILY. 07/10/15  Yes Margarita Rana, MD  chlorthalidone  (HYGROTON) 25 MG tablet Take 25 mg by mouth daily.   Yes Historical Provider, MD  Cholecalciferol (VITAMIN D) 2000 UNITS CAPS Take 1 capsule by mouth daily.   Yes Historical Provider, MD  citalopram (CELEXA) 20 MG tablet TAKE 1 TABLET BY MOUTH ONCE DAILY. 08/07/16  Yes Clearnce Sorrel Burnette, PA-C  doxepin (SINEQUAN) 25 MG capsule Take 25 mg by mouth at bedtime.  01/06/16  Yes Historical Provider, MD  fluticasone (FLONASE) 50 MCG/ACT nasal spray USE 2 SPRAYS IN EACH NOSTRIL ONCE DAILY. 04/09/16  Yes Margarita Rana, MD  Fluticasone Furoate-Vilanterol (BREO ELLIPTA) 100-25 MCG/INH AEPB Inhale 1 puff into the lungs daily.    Yes Historical Provider, MD  lisinopril (PRINIVIL,ZESTRIL) 20 MG tablet Take 20 mg by mouth daily.  01/01/16  Yes Historical Provider, MD  LORazepam (ATIVAN) 0.5 MG tablet Take 1 tablet (0.5 mg total) by mouth every 8 (eight) hours as needed. for anxiety 05/25/16  Yes Carmon Ginsberg, PA  montelukast (SINGULAIR) 10 MG tablet Take 1 tablet (10 mg total) by mouth daily. 06/10/16  Yes Mar Daring, PA-C  Omega-3 Fatty Acids (FISH OIL) 1000 MG CAPS Take 1 capsule by mouth daily.   Yes Historical Provider, MD  omeprazole (PRILOSEC) 40 MG capsule Reported on 05/28/2016 04/30/16  Yes Historical Provider, MD  potassium chloride SA (K-DUR,KLOR-CON) 20 MEQ tablet TAKE 1 TABLET BY MOUTH TWICE DAILY Patient taking differently: TAKE 1 TABLET BY MOUTH DAILY 10/04/15  Yes Margarita Rana, MD  PROVENTIL HFA 108 [phone] Base) MCG/ACT inhaler Inhale 1-2 puffs into the lungs every 4 (four) hours as needed.  01/03/16  Yes Historical Provider, MD  simvastatin (ZOCOR) 40 MG tablet TAKE 1 TABLET BY MOUTH AT BEDTIME. 01/01/16  Yes Margarita Rana, MD  tiotropium (SPIRIVA) 18 MCG inhalation capsule Place 1 capsule (18 mcg total) into inhaler and inhale daily. 08/05/15  Yes Wilhelmina Mcardle, MD  triamcinolone cream (KENALOG) 0.1 % Apply 1 application topically daily.  12/11/15  Yes Historical Provider, MD      VITAL SIGNS:   Blood pressure (!) 96/53, pulse (!) 106, temperature 98.5 F (36.9 C), temperature source Oral, resp. rate (!) 22, height 5' 3"  (1.6 m), weight 72.7 kg (160 lb 4.4 oz), SpO2 96 %.  PHYSICAL EXAMINATION:  GENERAL:  70 y.o.-year-old patient lying in the bed with no acute distress.  EYES: Pupils equal, round, reactive to light and accommodation. No scleral icterus. Extraocular muscles intact.  HEENT: Head atraumatic, normocephalic. Oropharynx and nasopharynx clear.  dry mucous membranes  NECK:  Supple, no jugular venous distention. No thyroid enlargement, no tenderness.  LUNGS:  diminished breath  sounds bilaterally, no wheezing,  diffuse rales,rhonchi   right lung base crepitation. No use of accessory muscles of respiration.  CARDIOVASCULAR: S1, S2 normal. No murmurs, rubs, or gallops.  ABDOMEN: Soft, nontender, nondistended. Bowel sounds present. No organomegaly or mass.  EXTREMITIES: No pedal edema, cyanosis, or  clubbing.  NEUROLOGIC: Cranial nerves II through XII are intact. Muscle strength  at her baseline in all extremities. Sensation intact. Gait not checked.  PSYCHIATRIC: The patient is alert and oriented x 3.  SKIN: No obvious rash, lesion, or ulcer.   LABORATORY PANEL:   CBC  Recent Labs Lab 10/08/16 1131  WBC 31.5*  HGB 11.6*  HCT 34.1*  PLT 150   ------------------------------------------------------------------------------------------------------------------  Chemistries   Recent Labs Lab 10/08/16 1131  NA 131*  K 3.1*  CL 94*  CO2 21*  GLUCOSE 116*  BUN 32*  CREATININE 2.96*  CALCIUM 8.6*  MG 0.8*  AST 91*  ALT 69*  ALKPHOS 43  BILITOT 2.9*   ------------------------------------------------------------------------------------------------------------------  Cardiac Enzymes  Recent Labs Lab 10/08/16 1131  TROPONINI 0.03*    ------------------------------------------------------------------------------------------------------------------  RADIOLOGY:  Dg Chest Portable 1 View  Result Date: 10/08/2016 CLINICAL DATA:  Central line placement EXAM: PORTABLE CHEST 1 VIEW COMPARISON:  Earlier today FINDINGS: New right IJ central line with tip at the upper right atrium. No pneumothorax or mediastinal widening. Dense right basilar pneumonia. No effusion or cavitation visualized. Background of generalized interstitial coarsening. IMPRESSION: 1. No acute finding after central line placement. 2. Dense right basilar pneumonia. Electronically Signed   By: Monte Fantasia M.D.   On: 10/08/2016 15:30   Dg Chest Portable 1 View  Result Date: 10/08/2016 CLINICAL DATA:  Increasing shortness of breath. Right lung pneumonia. EXAM: PORTABLE CHEST 1 VIEW 2:07 p.m. COMPARISON:  10/08/2016 at 11:39 a.m. FINDINGS: There has been progression of the consolidative infiltrate in the right lung base. There is new peribronchial thickening and slight interstitial accentuation on the left. Heart size and pulmonary vascularity are at the upper limits of normal. Calcification in the thoracic aorta. IMPRESSION: Slight progression of right base pneumonia. Progressive bronchitic changes on the left. Aortic atherosclerosis. Electronically Signed   By: Lorriane Shire M.D.   On: 10/08/2016 14:25   Dg Chest Portable 1 View  Result Date: 10/08/2016 CLINICAL DATA:  70 year old presenting with hypoxia. Current history of hypertension, asthma and emphysema. Former smoker. Prior MI. EXAM: PORTABLE CHEST 1 VIEW COMPARISON:  04/15/2016 and earlier, including CT chest 06/13/2014 and earlier. FINDINGS: Cardiac silhouette upper normal in size for AP portable technique, unchanged. Thoracic aorta atherosclerotic, unchanged. Hilar and mediastinal contours otherwise unremarkable. Airspace consolidation involving the right lung base. Lungs otherwise clear. Pulmonary  vascularity normal. IMPRESSION: Acute pneumonia involving the right lung base. Electronically Signed   By: Evangeline Dakin M.D.   On: 10/08/2016 12:08    EKG:   Orders placed or performed during the hospital encounter of 10/08/16  . ED EKG  . ED EKG    IMPRESSION AND PLAN:   Earnesteen Birnie  is a 70 y.o. female with a known history of COPD, Crohn's disease, coronary artery disease, stroke is presenting to the ED with a chief complaint of nausea vomiting and diarrhea. Vomiting and diarrhea was nonbloody Patient reported that her symptoms started yesterday. Patient was also reporting dry cough with congestion and intermittent episodes of wheezing   #septic shock -meets criteria with significant hypotension, sinus tachycardia, leukocytosis and pneumonia   admit to stepdown unit   aggressive hydration with IV fluids   Neo-Synephrine as needed to maintain map at or greater than 65 Started the patient on broad-spectrum IV antibiotics Zosyn and vancomycin Follow-up on the blood and urine cultures as well as sputum culture and sensitivity if obtained  #Pneumonia could be aspiration pneumonia as patient was vomiting  and lethargic at home Will get sputum culture and sensitivity Zosyn and vancomycin  # acute kidney injury secondary to dehydration from nausea vomiting and diarrhea Hydrate with IV fluids and monitor renal function closely, avoid nephrotoxins Consult nephrology if no improvement  #Nausea vomiting and diarrhea could be acute viral gastroenteritis Symptomatically treatment, IV fluids Will check stool for culture and C. Difficile   #hypomagnesemia Replete magnesium and check   Provide GI and DVT prophylaxis   All the records are reviewed and case discussed with ED provider. Management plans discussed with the patient, family and they are in agreement.  CODE STATUS: Full code, sON healthcare power of attorney  TOTAL Critical care TIME TAKING CARE OF THIS PATIENT: 50 minutes.    Note: This dictation was prepared with Dragon dictation along with smaller phrase technology. Any transcriptional errors that result from this process are unintentional.  Nicholes Mango M.D on 10/08/2016 at 5:31 PM  Between 7am to 6pm - Pager - [phone]  After 6pm go to [email or web address] - password [email or web address] [facility]  Office  [phone]  CC: Primary care physician; Mar Daring, PA-C

## 2016-10-08 NOTE — ED Notes (Signed)
Dr. Amado Coe to bedside at this time.

## 2016-10-08 NOTE — ED Notes (Signed)
MD notified of patient BP 60/40.

## 2016-10-08 NOTE — ED Notes (Signed)
Report called to Kelsey, RN.

## 2016-10-08 NOTE — Progress Notes (Addendum)
ELECTROLYTE CONSULT - INITIAL   Pharmacy Consult for electrolyte monitoring/replacement Indication: hypokalemia/hypomagenesemia  Allergies  Allergen Reactions  . Levaquin [Levofloxacin] Nausea Only  . Vicodin [Hydrocodone-Acetaminophen]     nausea    Patient Measurements: Height: 5\' 3"  (160 cm) Weight: 150 lb (68 kg) IBW/kg (Calculated) : 52.4  Vital Signs: Temp: 98.3 F (36.8 C) (11/23 1126) Temp Source: Oral (11/23 1126) BP: 102/47 (11/23 1350) Pulse Rate: 121 (11/23 1350) Intake/Output from previous day: No intake/output data recorded. Intake/Output from this shift: Total I/O In: -  Out: 280 [Urine:280]  Labs:  Recent Labs  10/08/16 1131  WBC 31.5*  HGB 11.6*  HCT 34.1*  PLT 150  CREATININE 2.96*  MG 0.8*  ALBUMIN 3.5  PROT 6.8  AST 91*  ALT 69*  ALKPHOS 43  BILITOT 2.9*   Estimated Creatinine Clearance: 16.8 mL/min (by C-G formula based on SCr of 2.96 mg/dL (H)).  Medical History: Past Medical History:  Diagnosis Date  . Arthritis   . Asthma   . Crohn's disease (HCC)   . Emphysema of lung (HCC)   . GERD (gastroesophageal reflux disease)   . History of chicken pox   . History of heart attack   . Hx of completed stroke   . Hypercholesteremia   . Hypertension   . Seasonal allergies   . UTI (lower urinary tract infection)    Assessment: Pharmacy consulted to monitor and supplement electrolytes as needed in this 70 year old female admitted in septic shock.   K = 3.1 Mg = 0.8  Goal of Therapy:  Electrolytes WNL  Plan:  Patient has received magnesium 2 g IV dose in ED. Will order another 4 g dose of IV magnesium to be given at 1600.  KCl 40 mEq PO x 1 (patient has taken another PO medication in ED)  Ordered phosphorus as add-on lab  Will recheck Mg this evening after dose.  Cindi Carbon, PharmD, BCPS Clinical Pharmacist 10/08/2016,2:48 PM   Phos resulted slightly below goal range @ 2.2; therefore will give KPhos 10 mmol IV x 1.  Will recheck Phos with am labs.   Demetrius Charity, PharmD

## 2016-10-08 NOTE — ED Notes (Signed)
Pt transported to the floor by Annette Stable, Charity fundraiser.

## 2016-10-08 NOTE — Progress Notes (Addendum)
MEDICATION RELATED CONSULT NOTE - INITIAL   Pharmacy Consulted to assist in electrolyte management in this 59 yoF admitted with septic shock. Pt currently has poor renal function so will replace conservatively   11/23 1130 k = 3.1; mag = 0.8; phos = 2.2 Mag 2 g IV followed by mag 4 g IV, Kphos 10 mmol IV x 1, and KCl tablet 40 mEq PO x 1 was ordered  Goals of therapy: WNL  Plan: 11/23 2000 mag = 2.9 Will order K for one hour after Kphos infusion ends and replace per consult  1123 2311 K 4. No further supplement at this time. Recheck all electrolytes with AM labs. Justan Gaede A. Wilhoit, [place].D., BCPS, Clinical Pharmacist   Allergies  Allergen Reactions  . Levaquin [Levofloxacin] Nausea Only  . Vicodin [Hydrocodone-Acetaminophen]     nausea    Patient Measurements: Height: 5\' 3"  (160 cm) Weight: 160 lb 4.4 oz (72.7 kg) IBW/kg (Calculated) : 52.4   Vital Signs: Temp: 97.8 F (36.6 C) (11/23 1945) Temp Source: Oral (11/23 1945) BP: 93/44 (11/23 1900) Pulse Rate: 101 (11/23 1900) Intake/Output from previous day: No intake/output data recorded. Intake/Output from this shift: Total I/O In: -  Out: 500 [Urine:500]  Labs:  Recent Labs  10/08/16 1131 10/08/16 1952  WBC 31.5*  --   HGB 11.6*  --   HCT 34.1*  --   PLT 150  --   CREATININE 2.96*  --   MG 0.8* 2.9*  PHOS 2.2*  --   ALBUMIN 3.5  --   PROT 6.8  --   AST 91*  --   ALT 69*  --   ALKPHOS 43  --   BILITOT 2.9*  --    Estimated Creatinine Clearance: 17.4 mL/min (by C-G formula based on SCr of 2.96 mg/dL (H)).   Microbiology: Recent Results (from the past 720 hour(s))  MRSA PCR Screening     Status: None   Collection Time: 10/08/16  4:00 PM  Result Value Ref Range Status   MRSA by PCR NEGATIVE NEGATIVE Final    Comment:        The GeneXpert MRSA Assay (FDA approved for NASAL specimens only), is one component of a comprehensive MRSA colonization surveillance program. It is not intended to diagnose  MRSA infection nor to guide or monitor treatment for MRSA infections.     Medical History: Past Medical History:  Diagnosis Date  . Arthritis   . Asthma   . Crohn's disease (HCC)   . Emphysema of lung (HCC)   . GERD (gastroesophageal reflux disease)   . History of chicken pox   . History of heart attack   . Hx of completed stroke   . Hypercholesteremia   . Hypertension   . Seasonal allergies   . UTI (lower urinary tract infection)     Medications:  Scheduled:  . enoxaparin (LOVENOX) injection  30 mg Subcutaneous Q24H  . mouth rinse  15 mL Mouth Rinse BID  . piperacillin-tazobactam  3.375 g Intravenous Q12H  . potassium phosphate IVPB (mmol)  10 mmol Intravenous Once  . sodium chloride flush  3 mL Intravenous Q12H   Infusions:  . dextrose 5 % and 0.9 % NaCl with KCl 20 mEq/L 125 mL/hr at 10/08/16 1800  . phenylephrine (NEO-SYNEPHRINE) Adult infusion 60 mcg/min (10/08/16 2017)     Horris Latino, PharmD Pharmacy Resident 10/08/2016 9:23 PM

## 2016-10-08 NOTE — Progress Notes (Signed)
PHARMACIST - PHYSICIAN COMMUNICATION  CONCERNING:  Piperacillin/tazobactam dosing    RECOMMENDATION: Patient was prescribed piperacillin/tazobactam q8h.    Estimated Creatinine Clearance: 16.8 mL/min (by C-G formula based on SCr of 2.96 mg/dL (H)).   Based on [place] patient is candidate for piperacillin/tazobactam q12h dosing   DESCRIPTION: Pharmacy has adjusted piperacillin/tazobactam dose per [facility] policy.  Patient is now receiving piperacillin/tazobactam q12h based on CrCl <20 mL/min  Darrow Bussing, PharmD Pharmacy Resident 10/08/2016 4:13 PM

## 2016-10-08 NOTE — ED Notes (Signed)
Pt c/o SHOB, O2 sats 86% and maintaining. Pt placed on 2L via [place] at this time. Pt taken out of Trendelenburg due to c/o [facility] and desats.

## 2016-10-08 NOTE — ED Provider Notes (Signed)
[facility] Emergency Department Provider Note  ____________________________________________  Time seen: Approximately 11:25 AM  I have reviewed the triage vital signs and the nursing notes.   HISTORY  Chief Complaint Nausea; Emesis; Diarrhea; and Hypotension   HPI Lori Brady is a 70 y.o. female with a history of COPD, Crohn's disease, CAD, CVA who presents for evaluation of nausea, vomiting, and diarrhea. Patient reports that her symptoms started yesterday. She has had dry cough, congestion, and intermittent wheezing responsive to her breathing treatments at home. Also has had several episodes of nonbloody nonbilious emesis and watery diarrhea. Patient has had chills and subjective fever. Patient reports that she had right upper quadrant abdominal pain yesterday evening but that has resolved and no more pain this morning. Today she started feeling dizzy like she was going to pass out which prompted her to call EMS. Patient was found to be hypotensive with systolics in the 93G and tachycardic with heart rate of 120. She is afebrile. She was given 300 cc on route to the emergency room and 4 mg of Zofran. Patient denies chest pain, abdominal pain, dysuria.  Past Medical History:  Diagnosis Date  . Arthritis   . Asthma   . Crohn's disease ([place])   . Emphysema of lung ([place])   . GERD (gastroesophageal reflux disease)   . History of chicken pox   . History of heart attack   . Hx of completed stroke   . Hypercholesteremia   . Hypertension   . Seasonal allergies   . UTI (lower urinary tract infection)     Patient Active Problem List   Diagnosis Date Noted  . Septic shock ([place]) 10/08/2016  . Asthma without status asthmaticus 03/31/2016  . Osteoporosis 03/31/2016  . Elevated LFTs 09/17/2015  . Parotid swelling 09/13/2015  . COPD exacerbation ([place]) 09/13/2015  . Shortness of breath 09/13/2015  . Urticaria 09/13/2015  . Cerebrovascular accident (CVA) ([place])  08/21/2015  . Senile purpura ([place]) 08/21/2015  . Atrial tachycardia ([place]) 08/13/2015  . Eczema 07/10/2015  . Allergic rhinitis 06/26/2015  . Hyperkalemia 05/03/2015  . Anxiety 04/23/2015  . Edema leg 03/27/2015  . Fatigue 03/06/2015  . Primary cardiomyopathy ([place]) 03/05/2015  . Benign essential HTN 02/28/2015  . Combined fat and carbohydrate induced hyperlipemia 09/06/2014  . COPD, severe ([place]) 08/01/2014  . Acid reflux 08/01/2014  . Cough 08/01/2014    Past Surgical History:  Procedure Laterality Date  . BREAST BIOPSY Bilateral 1960's to 90's    Prior to Admission medications   Medication Sig Start Date End Date Taking? Authorizing Provider  albuterol (ACCUNEB) 0.63 MG/3ML nebulizer solution Take 1 ampule by nebulization every 6 (six) hours as needed for wheezing.   Yes Historical Provider, MD  alendronate (FOSAMAX) 70 MG tablet Take 1 tablet (70 mg total) by mouth once a week. Patient taking differently: Take 70 mg by mouth once a week. mondays 07/15/16  Yes Birdie Sons, MD  aspirin 81 MG tablet Take 81 mg by mouth daily.   Yes Historical Provider, MD  betamethasone valerate (VALISONE) 0.1 % cream APPLY TOPICALLY TO AFFECTED AREA SPARINGLY 2 TIMES DAILY. 07/10/15  Yes Margarita Rana, MD  chlorthalidone (HYGROTON) 25 MG tablet Take 25 mg by mouth daily.   Yes Historical Provider, MD  Cholecalciferol (VITAMIN D) 2000 UNITS CAPS Take 1 capsule by mouth daily.   Yes Historical Provider, MD  citalopram (CELEXA) 20 MG tablet TAKE 1 TABLET BY MOUTH ONCE DAILY. 08/07/16  Yes Anderson Malta  M Burnette, PA-C  doxepin (SINEQUAN) 25 MG capsule Take 25 mg by mouth at bedtime.  01/06/16  Yes Historical Provider, MD  fluticasone (FLONASE) 50 MCG/ACT nasal spray USE 2 SPRAYS IN EACH NOSTRIL ONCE DAILY. 04/09/16  Yes Margarita Rana, MD  Fluticasone Furoate-Vilanterol (BREO ELLIPTA) 100-25 MCG/INH AEPB Inhale 1 puff into the lungs daily.    Yes Historical Provider, MD  lisinopril (PRINIVIL,ZESTRIL) 20 MG  tablet Take 20 mg by mouth daily.  01/01/16  Yes Historical Provider, MD  LORazepam (ATIVAN) 0.5 MG tablet Take 1 tablet (0.5 mg total) by mouth every 8 (eight) hours as needed. for anxiety 05/25/16  Yes Carmon Ginsberg, PA  montelukast (SINGULAIR) 10 MG tablet Take 1 tablet (10 mg total) by mouth daily. 06/10/16  Yes Mar Daring, PA-C  Omega-3 Fatty Acids (FISH OIL) 1000 MG CAPS Take 1 capsule by mouth daily.   Yes Historical Provider, MD  omeprazole (PRILOSEC) 40 MG capsule Reported on 05/28/2016 04/30/16  Yes Historical Provider, MD  potassium chloride SA (K-DUR,KLOR-CON) 20 MEQ tablet TAKE 1 TABLET BY MOUTH TWICE DAILY Patient taking differently: TAKE 1 TABLET BY MOUTH DAILY 10/04/15  Yes Margarita Rana, MD  PROVENTIL HFA 108 [phone] Base) MCG/ACT inhaler Inhale 1-2 puffs into the lungs every 4 (four) hours as needed.  01/03/16  Yes Historical Provider, MD  simvastatin (ZOCOR) 40 MG tablet TAKE 1 TABLET BY MOUTH AT BEDTIME. 01/01/16  Yes Margarita Rana, MD  tiotropium (SPIRIVA) 18 MCG inhalation capsule Place 1 capsule (18 mcg total) into inhaler and inhale daily. 08/05/15  Yes Wilhelmina Mcardle, MD  triamcinolone cream (KENALOG) 0.1 % Apply 1 application topically daily.  12/11/15  Yes Historical Provider, MD    Allergies Levaquin [levofloxacin] and Vicodin [hydrocodone-acetaminophen]  Family History  Problem Relation Age of Onset  . Cancer Sister     ovarian/uterine/breast  . Breast cancer Sister 55  . Cancer Sister     breast  . Heart disease Sister   . Breast cancer Sister 53  . Heart disease Father   . Cancer Father     liver  . Cancer Mother   . Heart disease Brother   . Cancer Brother     liver  . Heart disease Sister   . Diabetes Maternal Grandmother     Social History Social History  Substance Use Topics  . Smoking status: Former Smoker    Packs/day: 1.00    Years: 30.00    Types: Cigarettes    Quit date: 08/01/2008  . Smokeless tobacco: Never Used  . Alcohol use Yes      Comment: occasional    Review of Systems  Constitutional: Negative for fever. + dizziness Eyes: Negative for visual changes. ENT: Negative for sore throat. Neck: No neck pain  Cardiovascular: Negative for chest pain. Respiratory: Negative for shortness of breath. Gastrointestinal: Negative for abdominal pain. + nausea, vomiting, diarrhea. Genitourinary: Negative for dysuria. Musculoskeletal: Negative for back pain. Skin: Negative for rash. Neurological: Negative for headaches, weakness or numbness. Psych: No SI or HI  ____________________________________________   PHYSICAL EXAM:  VITAL SIGNS: Vitals:   10/08/16 1330 10/08/16 1350  BP: (!) 113/51 (!) 102/47  Pulse: (!) 122 (!) 121  Resp: (!) 29 (!) 27  Temp:     Constitutional: Alert and oriented, sunken eyes, no apparent distress. HEENT:      Head: Normocephalic and atraumatic.         Eyes: Conjunctivae are normal. Sclera is non-icteric. EOMI. PERRL  Mouth/Throat: Mucous membranes are dry.       Neck: Supple with no signs of meningismus. Cardiovascular: Tachycardic with regular rhythm. No murmurs, gallops, or rubs. 2+ symmetrical distal pulses are present in all extremities. No JVD. Respiratory: Normal respiratory effort, hypoxic to 88% on RA. Lungs are clear to auscultation bilaterally. No wheezes, crackles, or rhonchi.  Gastrointestinal: Soft, non tender, and non distended with positive bowel sounds. No rebound or guarding. Musculoskeletal: Nontender with normal range of motion in all extremities. No edema, cyanosis, or erythema of extremities. Neurologic: Normal speech and language. Face is symmetric. Moving all extremities. No gross focal neurologic deficits are appreciated. Skin: Skin is warm, dry and intact. No rash noted. Psychiatric: Mood and affect are normal. Speech and behavior are normal.  ____________________________________________   LABS (all labs ordered are listed, but only abnormal results are  displayed)  Labs Reviewed  CBC WITH DIFFERENTIAL/PLATELET - Abnormal; Notable for the following:       Result Value   WBC 31.5 (*)    RBC 3.63 (*)    Hemoglobin 11.6 (*)    HCT 34.1 (*)    nRBC 1 (*)    Neutro Abs 27.5 (*)    Lymphs Abs 0.9 (*)    Monocytes Absolute 2.8 (*)    All other components within normal limits  COMPREHENSIVE METABOLIC PANEL - Abnormal; Notable for the following:    Sodium 131 (*)    Potassium 3.1 (*)    Chloride 94 (*)    CO2 21 (*)    Glucose, Bld 116 (*)    BUN 32 (*)    Creatinine, Ser 2.96 (*)    Calcium 8.6 (*)    AST 91 (*)    ALT 69 (*)    Total Bilirubin 2.9 (*)    GFR calc non Af Amer 15 (*)    GFR calc Af Amer 18 (*)    Anion gap 16 (*)    All other components within normal limits  MAGNESIUM - Abnormal; Notable for the following:    Magnesium 0.8 (*)    All other components within normal limits  URINALYSIS COMPLETEWITH MICROSCOPIC ([facility] ONLY) - Abnormal; Notable for the following:    Color, Urine YELLOW (*)    APPearance CLEAR (*)    Hgb urine dipstick 1+ (*)    Protein, ur 30 (*)    Bacteria, UA RARE (*)    Squamous Epithelial / LPF 0-5 (*)    All other components within normal limits  LACTIC ACID, PLASMA - Abnormal; Notable for the following:    Lactic Acid, Venous 5.1 (*)    All other components within normal limits  TROPONIN I - Abnormal; Notable for the following:    Troponin I 0.03 (*)    All other components within normal limits  PHOSPHORUS - Abnormal; Notable for the following:    Phosphorus 2.2 (*)    All other components within normal limits  CULTURE, BLOOD (ROUTINE X 2)  CULTURE, BLOOD (ROUTINE X 2)  URINE CULTURE  INFLUENZA PANEL BY PCR (TYPE A & B, H1N1)  LACTIC ACID, PLASMA  MAGNESIUM   ____________________________________________  EKG  ED ECG REPORT I, Rudene Re, the attending physician, personally viewed and interpreted this ECG.  Sinus tachycardia, rate of 101, normal intervals, prolonged QTC,  no ST elevations or depressions. ____________________________________________  RADIOLOGY  CXR: Acute pneumonia involving the right lung base. ____________________________________________   PROCEDURES  Procedure(s) performed:yes .Central Line Date/Time: 10/08/2016 3:29 PM Performed by:  Shakiyah Cirilo, [place] Authorized by: Rudene Re   Consent:    Consent obtained:  Written   Consent given by:  Patient   Risks discussed:  Arterial puncture, bleeding, infection, incorrect placement, nerve damage and pneumothorax Pre-procedure details:    Hand hygiene: Hand hygiene performed prior to insertion     Sterile barrier technique: All elements of maximal sterile technique followed     Skin preparation:  Betadine   Skin preparation agent: Skin preparation agent completely dried prior to procedure   Anesthesia (see MAR for exact dosages):    Anesthesia method:  Local infiltration   Local anesthetic:  Lidocaine 1% WITH epi Procedure details:    Location:  R internal jugular   Patient position:  Flat   Procedural supplies:  Triple lumen   Landmarks identified: yes     Ultrasound guidance: yes     Sterile ultrasound techniques: Sterile gel and sterile probe covers were used     Number of attempts:  1   Successful placement: yes   Post-procedure details:    Post-procedure:  Dressing applied and line sutured   Assessment:  Blood return through all ports, no pneumothorax on x-ray, free fluid flow and placement verified by x-ray   Patient tolerance of procedure:  Tolerated well, no immediate complications   Critical Care performed: yes  CRITICAL CARE Performed by: Rudene Re  ?  Total critical care time: 40 min  Critical care time was exclusive of separately billable procedures and treating other patients.  Critical care was necessary to treat or prevent imminent or life-threatening deterioration.  Critical care was time spent personally by me on the following  activities: development of treatment plan with patient and/or surrogate as well as nursing, discussions with consultants, evaluation of patient's response to treatment, examination of patient, obtaining history from patient or surrogate, ordering and performing treatments and interventions, ordering and review of laboratory studies, ordering and review of radiographic studies, pulse oximetry and re-evaluation of patient's condition.  ____________________________________________   INITIAL IMPRESSION / ASSESSMENT AND PLAN / ED COURSE  70 y.o. female with a history of COPD, Crohn's disease, CAD, CVA who presents for evaluation of cough, congestion, subjective fevers, chills, nausea, vomiting, and diarrhea since yesterday. Patient looks dehydrated, tachycardic to the low 100s and BP in the 93T systolic. Abdomen is soft and non tender with + BS. Lungs CTAB with normal WOB however patient is hypoxic to 88% on RA. She reports just using her nebulizer this morning. She is moving good air at this time with no wheezing. However due to her hypoxia and recent episode of wheezing in the setting of a viral URI we'll give her another breathing treatment. We'll get a chest x-ray to rule out pneumonia. Patient's afebrile here and I believe that her blood pressure is low due to severe dehydration. We'll give her 2 L of normal saline and pressure bags. We'll check blood work, urinalysis, rapid flu. EKG with no evidence of ischemia.  Clinical Course as of Oct 08 1529  Thu Oct 08, 2016  1243 Cxr concerning for PNA. Ceftriaxone and doxycycline ordered (avoid levaquin and azithro due to QTc prolongation). Patient currently remains hypotensive with improvement of BP from 60s to 80-90s after 2L NS. Will start 3rd L. WBC 31.5, lactate 5.1. AKI with creatinine 2.96. Magnesium 0.8, 2g IV mag ordered. Will consult hospitalist for admission to stepdown.  [CV]  1404 BP improved with IVF however patient started to complain of worsening  CP. Patient started to  have diffuse crackles concerning for possible volume overload. Patient with no increased oxygen requirement. Patient was placed on BiPAP. PCXR ordered. Flu pending.   [CV]    Clinical Course User Index [CV] Rudene Re, MD    Pertinent labs & imaging results that were available during my care of the patient were reviewed by me and considered in my medical decision making (see chart for details).    ____________________________________________   FINAL CLINICAL IMPRESSION(S) / ED DIAGNOSES  Final diagnoses:  Sepsis, due to unspecified organism [facility])  Community acquired pneumonia of right lower lobe of lung ([place])  AKI (acute kidney injury) ([place])  Hypomagnesemia  Septic shock ([place])      NEW MEDICATIONS STARTED DURING THIS VISIT:  New Prescriptions   No medications on file     Note:  This document was prepared using Dragon voice recognition software and may include unintentional dictation errors.    Rudene Re, MD 10/08/16 [phone]

## 2016-10-08 NOTE — ED Notes (Signed)
Pt has had chest x-ray, per MD okay to use central line.

## 2016-10-08 NOTE — ED Notes (Signed)
MD to bedside at this time to start central line. Informed consent obtained by this RN and placed in patient chart. Pt has been removed from BiPap at this time per Dr. Arbutus Leas order to allow for central line insertion.

## 2016-10-08 NOTE — Progress Notes (Signed)
Pharmacy Antibiotic Note  Lori Brady is a 70 y.o. female admitted on 10/08/2016 with sepsis.  Pharmacy has been consulted for Vancomycin  dosing.  Plan:  Patient in septic shock. Baseline creatinine ~ 0.8 mg/dl. Currently Serum creatinine is 2.96 mg/dl. Will give Vancomycin 1500 mg IV x 1 (~23 mg/kg). Will need to recheck Vancomycin level to determine next redosing. Will order another serum creatinine to be drawn in the am. Based on current renal function, would not need to have a random level for @ least 48 hours; however likely will need to be sooner if the renal function improves upon hydration and treatment of possible infection.     Height: 5\' 3"  (160 cm) Weight: 150 lb (68 kg) IBW/kg (Calculated) : 52.4  Temp (24hrs), Avg:98.3 F (36.8 C), Min:98.3 F (36.8 C), Max:98.3 F (36.8 C)   Recent Labs Lab 10/08/16 1131  WBC 31.5*  CREATININE 2.96*  LATICACIDVEN 5.1*    Estimated Creatinine Clearance: 16.8 mL/min (by C-G formula based on SCr of 2.96 mg/dL (H)).    Allergies  Allergen Reactions  . Levaquin [Levofloxacin] Nausea Only  . Vicodin [Hydrocodone-Acetaminophen]     nausea    Antimicrobials this admission: Vancomycin  11/23 >>  Rocephin  11/23 >> 11/23 Doxycyline 11/23 >> 11/23   Dose adjustments this admission:   Microbiology results:  BCx: pending   UCx:  Pending   Sputum:    MRSA PCR:   Thank you for allowing pharmacy to be a part of this patient's care.  Anadalay Macdonell D 10/08/2016 3:12 PM

## 2016-10-08 NOTE — ED Notes (Signed)
Boluses placed on pressure bags at this time. Pt placed in Trendelenburg. Pt family to bedside at this time. Will continue to monitor for further patient needs.

## 2016-10-08 NOTE — ED Notes (Signed)
MD notified pt's pressure 77/39. MD to bedside at this time.

## 2016-10-08 NOTE — ED Triage Notes (Signed)
Pt presents to ED with c.o N/V/D and hypotension. Per EMS pt was 62/40 manually in the truck with a HR of 112, ST on the monitor, CBG 128. Pt reports N/V/D and cold chills that started yesterday. Reports 4-5 episodes vomiting yesterday, no episodes today. Pt also reports 6 episodes of diarrhea yesterday and 3 today. Pt presents alert and oriented at this time.

## 2016-10-08 NOTE — ED Notes (Signed)
Called carelink spoke to [facility] for code sepsis delayed due to carelink in emergency with stemi

## 2016-10-08 NOTE — ED Notes (Signed)
MD notified of change in patient condition. Pt c/o SHOB. Pt noted to have some crackles bilaterally in her upper lungs. MD to bedside to listen, per MD give 2L NS due to patient's continued hypotension.

## 2016-10-09 ENCOUNTER — Inpatient Hospital Stay
Admit: 2016-10-09 | Discharge: 2016-10-09 | Disposition: A | Discharge status: Home / Self Care | Payer: Medicare Other | Attending: Internal Medicine | Admitting: Internal Medicine

## 2016-10-09 LAB — CBC WITH DIFFERENTIAL/PLATELET
BAND NEUTROPHILS: 24 %
BASOS PCT: 0 %
Basophils Absolute: 0 10*3/uL (ref 0–0.1)
Blasts: 0 %
EOS ABS: 0 10*3/uL (ref 0–0.7)
EOS PCT: 0 %
HCT: 33.9 % — ABNORMAL LOW (ref 35.0–47.0)
HEMOGLOBIN: 11.4 g/dL — AB (ref 12.0–16.0)
LYMPHS ABS: 3.4 10*3/uL (ref 1.0–3.6)
LYMPHS PCT: 12 %
MCH: 31.4 pg (ref 26.0–34.0)
MCHC: 33.6 g/dL (ref 32.0–36.0)
MCV: 93.7 fL (ref 80.0–100.0)
MONO ABS: 1.4 10*3/uL — AB (ref 0.2–0.9)
MYELOCYTES: 0 %
Metamyelocytes Relative: 0 %
Monocytes Relative: 5 %
Neutro Abs: 23.2 10*3/uL — ABNORMAL HIGH (ref 1.4–6.5)
Neutrophils Relative %: 59 %
OTHER: 0 %
PLATELETS: 149 10*3/uL — AB (ref 150–440)
PROMYELOCYTES ABS: 0 %
RBC: 3.62 MIL/uL — ABNORMAL LOW (ref 3.80–5.20)
RDW: 13.1 % (ref 11.5–14.5)
WBC: 28 10*3/uL — ABNORMAL HIGH (ref 3.6–11.0)
nRBC: 0 /100 WBC

## 2016-10-09 LAB — COMPREHENSIVE METABOLIC PANEL
ALBUMIN: 3 g/dL — AB (ref 3.5–5.0)
ALK PHOS: 42 U/L (ref 38–126)
ALT: 53 U/L (ref 14–54)
ANION GAP: 7 (ref 5–15)
AST: 58 U/L — ABNORMAL HIGH (ref 15–41)
BUN: 30 mg/dL — ABNORMAL HIGH (ref 6–20)
CALCIUM: 6.6 mg/dL — AB (ref 8.9–10.3)
CHLORIDE: 105 mmol/L (ref 101–111)
CO2: 18 mmol/L — AB (ref 22–32)
Creatinine, Ser: 1.54 mg/dL — ABNORMAL HIGH (ref 0.44–1.00)
GFR calc non Af Amer: 34 mL/min — ABNORMAL LOW (ref >60)
GFR, EST AFRICAN AMERICAN: 39 mL/min — AB (ref >60)
GLUCOSE: 165 mg/dL — AB (ref 65–99)
POTASSIUM: 4.2 mmol/L (ref 3.5–5.1)
SODIUM: 130 mmol/L — AB (ref 135–145)
Total Bilirubin: 1.7 mg/dL — ABNORMAL HIGH (ref 0.3–1.2)
Total Protein: 6 g/dL — ABNORMAL LOW (ref 6.5–8.1)

## 2016-10-09 LAB — URINE CULTURE
Culture: NO GROWTH
Special Requests: NORMAL

## 2016-10-09 LAB — LACTIC ACID, PLASMA: Lactic Acid, Venous: 0.9 mmol/L (ref 0.5–1.9)

## 2016-10-09 LAB — PHOSPHORUS: PHOSPHORUS: 3 mg/dL (ref 2.5–4.6)

## 2016-10-09 LAB — ECHOCARDIOGRAM COMPLETE
Height: 63 in
Weight: 2564.39 oz

## 2016-10-09 LAB — MAGNESIUM: Magnesium: 2.6 mg/dL — ABNORMAL HIGH (ref 1.7–2.4)

## 2016-10-09 MED ORDER — ZOLPIDEM TARTRATE 5 MG PO TABS
5.0000 mg | ORAL_TABLET | Freq: Every evening | ORAL | Status: DC | PRN
  Administered 2016-10-09 – 2016-10-12 (×4): 5 mg via ORAL
  Filled 2016-10-09 (×4): qty 1

## 2016-10-09 MED ORDER — SODIUM CHLORIDE 0.9 % IV SOLN
INTRAVENOUS | Status: AC
  Administered 2016-10-09: 09:00:00 via INTRAVENOUS
  Administered 2016-10-09: 75 mL/h via INTRAVENOUS

## 2016-10-09 MED ORDER — FUROSEMIDE 10 MG/ML IJ SOLN
40.0000 mg | Freq: Once | INTRAMUSCULAR | Status: AC
Start: 2016-10-09 — End: 2016-10-09
  Administered 2016-10-09: 40 mg via INTRAVENOUS
  Filled 2016-10-09: qty 4

## 2016-10-09 MED ORDER — PANTOPRAZOLE SODIUM 40 MG IV SOLR
40.0000 mg | Freq: Every day | INTRAVENOUS | Status: DC
  Administered 2016-10-09 – 2016-10-10 (×2): 40 mg via INTRAVENOUS
  Filled 2016-10-09 (×2): qty 40

## 2016-10-09 MED ORDER — PIPERACILLIN-TAZOBACTAM 3.375 G IVPB
3.3750 g | Freq: Three times a day (TID) | INTRAVENOUS | Status: DC
  Administered 2016-10-09 – 2016-10-13 (×11): 3.375 g via INTRAVENOUS
  Filled 2016-10-09 (×14): qty 50

## 2016-10-09 MED ORDER — SODIUM CHLORIDE 0.9% FLUSH: 10.0000 mL | INTRAVENOUS | Status: DC | PRN

## 2016-10-09 MED ORDER — ENOXAPARIN SODIUM 40 MG/0.4ML ~~LOC~~ SOLN
40.0000 mg | SUBCUTANEOUS | Status: DC
  Administered 2016-10-09 – 2016-10-12 (×4): 40 mg via SUBCUTANEOUS
  Filled 2016-10-09 (×4): qty 0.4

## 2016-10-09 MED ORDER — SODIUM CHLORIDE 0.9% FLUSH
10.0000 mL | Freq: Two times a day (BID) | INTRAVENOUS | Status: DC
  Administered 2016-10-09: 10 mL
  Administered 2016-10-09: 13 mL
  Administered 2016-10-10 (×2): 10 mL
  Administered 2016-10-11: 30 mL
  Administered 2016-10-11 – 2016-10-12 (×2): 10 mL
  Administered 2016-10-12: 13 mL
  Administered 2016-10-13: 40 mL

## 2016-10-09 NOTE — Progress Notes (Signed)
Chaplain rounded the unit to provide a compassionate presence and support to the patient and family. Family were at the bedside and the patient requested prayer. Prayer was provided for patient and family. [phone]

## 2016-10-09 NOTE — Progress Notes (Signed)
*  PRELIMINARY RESULTS* Echocardiogram 2D Echocardiogram has been performed.  Cristela Blue 10/09/2016, 10:47 AM

## 2016-10-09 NOTE — Progress Notes (Signed)
Pt is in no distress. Bipap is not in room.  

## 2016-10-09 NOTE — Progress Notes (Signed)
Pharmacy Antibiotic Note  Lori Brady is a 70 y.o. female admitted on 10/08/2016 with sepsis from PNA.  Pharmacy has been consulted for Vancomycin and Zosyn Dosing.  Plan: Will increase Zosyn dosing to 3.375 g EI q 8 hours.   After discussion with Dr. Cherlynn Kaiser, will d/c vancomycin due to negative MRSA PCR.  Height: 5\' 3"  (160 cm) Weight: 160 lb 4.4 oz (72.7 kg) IBW/kg (Calculated) : 52.4  Temp (24hrs), Avg:97.9 F (36.6 C), Min:97.8 F (36.6 C), Max:98.1 F (36.7 C)   Recent Labs Lab 10/08/16 1131 10/08/16 1654 10/09/16 0501 10/09/16 1426  WBC 31.5*  --  28.0*  --   CREATININE 2.96*  --  1.54*  --   LATICACIDVEN 5.1* 2.4*  --  0.9    Estimated Creatinine Clearance: 33.4 mL/min (by C-G formula based on SCr of 1.54 mg/dL (H)).    Allergies  Allergen Reactions  . Levaquin [Levofloxacin] Nausea Only  . Vicodin [Hydrocodone-Acetaminophen]     nausea   Results for orders placed or performed during the hospital encounter of 10/08/16  Blood Culture (routine x 2)     Status: None (Preliminary result)   Collection Time: 10/08/16  1:00 PM  Result Value Ref Range Status   Specimen Description BLOOD L AC  Final   Special Requests   Final    BOTTLES DRAWN AEROBIC AND ANAEROBIC AER ANA   Culture NO GROWTH < 24 HOURS  Final   Report Status PENDING  Incomplete  Blood Culture (routine x 2)     Status: None (Preliminary result)   Collection Time: 10/08/16  1:00 PM  Result Value Ref Range Status   Specimen Description BLOOD R HAND  Final   Special Requests   Final    BOTTLES DRAWN AEROBIC AND ANAEROBIC AER ANA   Culture NO GROWTH < 24 HOURS  Final   Report Status PENDING  Incomplete  Urine culture     Status: None   Collection Time: 10/08/16  1:20 PM  Result Value Ref Range Status   Specimen Description URINE, CATHETERIZED  Final   Special Requests Normal  Final   Culture NO GROWTH Performed at [facility]   Final   Report Status 10/09/2016 FINAL   Final  MRSA PCR Screening     Status: None   Collection Time: 10/08/16  4:00 PM  Result Value Ref Range Status   MRSA by PCR NEGATIVE NEGATIVE Final    Comment:        The GeneXpert MRSA Assay (FDA approved for NASAL specimens only), is one component of a comprehensive MRSA colonization surveillance program. It is not intended to diagnose MRSA infection nor to guide or monitor treatment for MRSA infections.    Anti-infectives    Start     Dose/Rate Route Frequency Ordered Stop   10/09/16 1900  piperacillin-tazobactam (ZOSYN) IVPB 3.375 g     3.375 g 12.5 mL/hr over 240 Minutes Intravenous Every 8 hours 10/09/16 1620     10/08/16 1700  piperacillin-tazobactam (ZOSYN) IVPB 3.375 g  Status:  Discontinued     3.375 g 100 mL/hr over 30 Minutes Intravenous Every 12 hours 10/08/16 1555 10/09/16 1620   10/08/16 1515  vancomycin (VANCOCIN) IVPB 1000 mg/200 mL premix  Status:  Discontinued     1,000 mg 200 mL/hr over 60 Minutes Intravenous STAT 10/08/16 1507 10/08/16 1512   10/08/16 1515  vancomycin (VANCOCIN) 1,500 mg in sodium chloride 0.9 % 500 mL IVPB  1,500 mg 250 mL/hr over 120 Minutes Intravenous STAT 10/08/16 1512 10/08/16 1802   10/08/16 1245  cefTRIAXone (ROCEPHIN) IVPB 1 g     1 g 100 mL/hr over 30 Minutes Intravenous  Once 10/08/16 1240 10/08/16 1334   10/08/16 1215  cefTRIAXone (ROCEPHIN) 1 g in dextrose 5 % 50 mL IVPB  Status:  Discontinued     1 g 100 mL/hr over 30 Minutes Intravenous  Once 10/08/16 1211 10/08/16 1240   10/08/16 1215  doxycycline (VIBRA-TABS) tablet 100 mg     100 mg Oral  Once 10/08/16 1211 10/08/16 1303     Thank you for allowing pharmacy to be a part of this patient's care.  Luisa Hart D 10/09/2016 4:18 PM

## 2016-10-09 NOTE — Progress Notes (Signed)
MEDICATION RELATED CONSULT NOTE - INITIAL   Pharmacy Consulted to assist in electrolyte management in this 59 yoF admitted with septic shock. Pt currently has poor renal function so will replace conservatively.  Goals of therapy: WNL  Plan: No supplementation needed. Will f/u AM labs.   Allergies  Allergen Reactions  . Levaquin [Levofloxacin] Nausea Only  . Vicodin [Hydrocodone-Acetaminophen]     nausea    Patient Measurements: Height: 5\' 3"  (160 cm) Weight: 160 lb 4.4 oz (72.7 kg) IBW/kg (Calculated) : 52.4   Vital Signs: Temp: 98.1 F (36.7 C) (11/24 1400) Temp Source: Oral (11/24 1400) BP: 94/50 (11/24 1545) Pulse Rate: 91 (11/24 1545) Intake/Output from previous day: 11/23 0701 - 11/24 0700 In: 4992.6 [I.V.:4589.3; IV Piggyback:403.3] Out: 780 [Urine:780] Intake/Output from this shift: Total I/O In: 2053 [I.V.:2053] Out: 4175 [Urine:4175]  Labs:  Recent Labs  10/08/16 1131 10/08/16 1952 10/09/16 0501  WBC 31.5*  --  28.0*  HGB 11.6*  --  11.4*  HCT 34.1*  --  33.9*  PLT 150  --  149*  CREATININE 2.96*  --  1.54*  MG 0.8* 2.9* 2.6*  PHOS 2.2*  --  3.0  ALBUMIN 3.5  --  3.0*  PROT 6.8  --  6.0*  AST 91*  --  58*  ALT 69*  --  53  ALKPHOS 43  --  42  BILITOT 2.9*  --  1.7*   Estimated Creatinine Clearance: 33.4 mL/min (by C-G formula based on SCr of 1.54 mg/dL (H)).  Luisa Hart, PharmD Clinical Pharmacist

## 2016-10-09 NOTE — Progress Notes (Addendum)
[facility] at [place] NAME: Lori Brady    MR#:  [id number]  DATE OF BIRTH:  08/26/1946  SUBJECTIVE:   Patient is here due to sepsis secondary to right-sided pneumonia. Remains on low-dose vasopressors. Family at bedside. Still having some pleuritic chest pain on the right side. No other acute complaints.  REVIEW OF SYSTEMS:    Review of Systems  Constitutional: Negative for chills and fever.  HENT: Negative for congestion and tinnitus.   Eyes: Negative for blurred vision and double vision.  Respiratory: Positive for cough. Negative for shortness of breath and wheezing.   Cardiovascular: Positive for chest pain (right sided Pleuritic). Negative for orthopnea and PND.  Gastrointestinal: Negative for abdominal pain, diarrhea, nausea and vomiting.  Genitourinary: Negative for dysuria and hematuria.  Neurological: Positive for weakness. Negative for dizziness, sensory change and focal weakness.  All other systems reviewed and are negative.   Nutrition: 2 gm sodium Tolerating Diet: Yes but little Tolerating PT: Await Eval.    DRUG ALLERGIES:   Allergies  Allergen Reactions  . Levaquin [Levofloxacin] Nausea Only  . Vicodin [Hydrocodone-Acetaminophen]     nausea    VITALS:  Blood pressure (!) 114/56, pulse 86, temperature 97.8 F (36.6 C), temperature source Oral, resp. rate (!) 22, height 5' 3"  (1.6 m), weight 72.7 kg (160 lb 4.4 oz), SpO2 98 %.  PHYSICAL EXAMINATION:   Physical Exam  GENERAL:  70 y.o.-year-old patient lying in the bed in mild resp. Distress.  EYES: Pupils equal, round, reactive to light and accommodation. No scleral icterus. Extraocular muscles intact.  HEENT: Head atraumatic, normocephalic. Oropharynx and nasopharynx clear.  NECK:  Supple, no jugular venous distention. No thyroid enlargement, no tenderness.  LUNGS: Poor Resp. effort, no wheezing, rales, + rhonchi at right lung base. No use of accessory muscles of  respiration.  CARDIOVASCULAR: S1, S2 normal. No murmurs, rubs, or gallops.  ABDOMEN: Soft, nontender, nondistended. Bowel sounds present. No organomegaly or mass.  EXTREMITIES: No cyanosis, clubbing or edema b/l.    NEUROLOGIC: Cranial nerves II through XII are intact. No focal Motor or sensory deficits b/l.  Globally weak.  PSYCHIATRIC: The patient is alert and oriented x 3.  SKIN: No obvious rash, lesion, or ulcer.    LABORATORY PANEL:   CBC  Recent Labs Lab 10/09/16 0501  WBC 28.0*  HGB 11.4*  HCT 33.9*  PLT 149*   ------------------------------------------------------------------------------------------------------------------  Chemistries   Recent Labs Lab 10/09/16 0501  NA 130*  K 4.2  CL 105  CO2 18*  GLUCOSE 165*  BUN 30*  CREATININE 1.54*  CALCIUM 6.6*  MG 2.6*  AST 58*  ALT 53  ALKPHOS 42  BILITOT 1.7*   ------------------------------------------------------------------------------------------------------------------  Cardiac Enzymes  Recent Labs Lab 10/08/16 1131  TROPONINI 0.03*   ------------------------------------------------------------------------------------------------------------------  RADIOLOGY:  Dg Chest Portable 1 View  Result Date: 10/08/2016 CLINICAL DATA:  Central line placement EXAM: PORTABLE CHEST 1 VIEW COMPARISON:  Earlier today FINDINGS: New right IJ central line with tip at the upper right atrium. No pneumothorax or mediastinal widening. Dense right basilar pneumonia. No effusion or cavitation visualized. Background of generalized interstitial coarsening. IMPRESSION: 1. No acute finding after central line placement. 2. Dense right basilar pneumonia. Electronically Signed   By: Monte Fantasia M.D.   On: 10/08/2016 15:30   Dg Chest Portable 1 View  Result Date: 10/08/2016 CLINICAL DATA:  Increasing shortness of breath. Right lung pneumonia. EXAM: PORTABLE CHEST 1 VIEW 2:07 p.m. COMPARISON:  10/08/2016 at 11:39 a.m.  FINDINGS: There has been progression of the consolidative infiltrate in the right lung base. There is new peribronchial thickening and slight interstitial accentuation on the left. Heart size and pulmonary vascularity are at the upper limits of normal. Calcification in the thoracic aorta. IMPRESSION: Slight progression of right base pneumonia. Progressive bronchitic changes on the left. Aortic atherosclerosis. Electronically Signed   By: Lorriane Shire M.D.   On: 10/08/2016 14:25   Dg Chest Portable 1 View  Result Date: 10/08/2016 CLINICAL DATA:  70 year old presenting with hypoxia. Current history of hypertension, asthma and emphysema. Former smoker. Prior MI. EXAM: PORTABLE CHEST 1 VIEW COMPARISON:  04/15/2016 and earlier, including CT chest 06/13/2014 and earlier. FINDINGS: Cardiac silhouette upper normal in size for AP portable technique, unchanged. Thoracic aorta atherosclerotic, unchanged. Hilar and mediastinal contours otherwise unremarkable. Airspace consolidation involving the right lung base. Lungs otherwise clear. Pulmonary vascularity normal. IMPRESSION: Acute pneumonia involving the right lung base. Electronically Signed   By: Evangeline Dakin M.D.   On: 10/08/2016 12:08     ASSESSMENT AND PLAN:   70 year old female with past medical history of hypertension, hyperlipidemia, asthma, COPD, history of Crohn's disease, history of previous CVA who presents to the hospital due to nausea vomiting and diarrhea and noted to be septic  1. Sepsis-patient is ruled in by clinical markers of tachycardia, leukocytosis and a dense right lower lobe pneumonia. -Continue broad-spectrum IV antibiotics with vancomycin, Zosyn.  -Continue IV fluids, IV vasopressors and wean as tolerated. Keep mean arterial pressure greater than 55. -Follow blood, sputum cultures.  2. Pneumonia-this is the cause of patient's sepsis. Next-continue IV vancomycin, Zosyn. Follow blood, sputum cultures.  3.  Leukocytosis-secondary to the sepsis/pneumonia. -We'll follow with IV therapy.  4. Acute kidney injury-likely ATN secondary to sepsis. -Continue IV fluids, follow BUN and creatinine and urine output. -Creatinine is improving.  5. Hypokalemia/hypomagnesemia-much improved with supplementation and will monitor.  6. COPD-no acute exacerbation. Continue when necessary DuoNeb's.  7. GERD-continue Protonix.   All the records are reviewed and case discussed with Care Management/Social Worker. Management plans discussed with the patient, family and they are in agreement.  CODE STATUS: Full  DVT Prophylaxis: Lovenox  TOTAL Critical Care TIME TAKING CARE OF THIS PATIENT: 30 minutes.   POSSIBLE D/C IN 1-2 DAYS, DEPENDING ON CLINICAL CONDITION.   Henreitta Leber M.D on 10/09/2016 at 1:55 PM  Between 7am to 6pm - Pager - [phone]  After 6pm go to [email or web address] - Proofreader  [facility]  Office  [phone]  CC: Primary care physician; Mar Daring, PA-C

## 2016-10-09 NOTE — Progress Notes (Signed)
Lovenox adjusted to 30 mg daily due to CrCl < 30 ml/min which has improved. Will increase Lovenox dose to 40 mg daily.   Luisa Hart, PharmD Clinical Pharmacist

## 2016-10-09 NOTE — Progress Notes (Signed)
Sainani, MD ordered to start weaning patient off of Neo drip. Goal MAP >55.

## 2016-10-10 LAB — EXPECTORATED SPUTUM ASSESSMENT W REFEX TO RESP CULTURE

## 2016-10-10 LAB — BASIC METABOLIC PANEL
ANION GAP: 7 (ref 5–15)
BUN: 25 mg/dL — ABNORMAL HIGH (ref 6–20)
CHLORIDE: 102 mmol/L (ref 101–111)
CO2: 23 mmol/L (ref 22–32)
Calcium: 6.7 mg/dL — ABNORMAL LOW (ref 8.9–10.3)
Creatinine, Ser: 1 mg/dL (ref 0.44–1.00)
GFR calc Af Amer: >60 mL/min (ref >60)
GFR, EST NON AFRICAN AMERICAN: 57 mL/min — AB (ref >60)
GLUCOSE: 92 mg/dL (ref 65–99)
POTASSIUM: 3.4 mmol/L — AB (ref 3.5–5.1)
Sodium: 132 mmol/L — ABNORMAL LOW (ref 135–145)

## 2016-10-10 LAB — EXPECTORATED SPUTUM ASSESSMENT W GRAM STAIN, RFLX TO RESP C

## 2016-10-10 LAB — CBC
HEMATOCRIT: 31 % — AB (ref 35.0–47.0)
HEMOGLOBIN: 10.7 g/dL — AB (ref 12.0–16.0)
MCH: 32.3 pg (ref 26.0–34.0)
MCHC: 34.4 g/dL (ref 32.0–36.0)
MCV: 93.8 fL (ref 80.0–100.0)
Platelets: 131 10*3/uL — ABNORMAL LOW (ref 150–440)
RBC: 3.3 MIL/uL — AB (ref 3.80–5.20)
RDW: 12.8 % (ref 11.5–14.5)
WBC: 12.1 10*3/uL — AB (ref 3.6–11.0)

## 2016-10-10 MED ORDER — KETOROLAC TROMETHAMINE 30 MG/ML IJ SOLN
30.0000 mg | Freq: Once | INTRAMUSCULAR | Status: AC
  Administered 2016-10-10: 30 mg via INTRAVENOUS
  Filled 2016-10-10: qty 1

## 2016-10-10 MED ORDER — POTASSIUM CHLORIDE CRYS ER 20 MEQ PO TBCR
20.0000 meq | EXTENDED_RELEASE_TABLET | Freq: Once | ORAL | Status: AC
  Administered 2016-10-10: 20 meq via ORAL
  Filled 2016-10-10: qty 1

## 2016-10-10 NOTE — Progress Notes (Signed)
Pt called RN to room complaining of nausea and shortness of breath. O2 97% on 2L O2 nasal cannula. PRN breathing treatment given. Patient reported relief.

## 2016-10-10 NOTE — Progress Notes (Signed)
MEDICATION RELATED CONSULT NOTE - INITIAL   Pharmacy Consulted to assist in electrolyte management in this 33 yoF admitted with septic shock. Pt currently has poor renal function so will replace conservatively.  Goals of therapy: WNL  Plan: K = 3.4, will give KCl 20 mEq PO x 1 dose and recheck electrolytes with AM labs tomorrow.   Allergies  Allergen Reactions  . Levaquin [Levofloxacin] Nausea Only  . Vicodin [Hydrocodone-Acetaminophen]     nausea    Patient Measurements: Height: 5\' 3"  (160 cm) Weight: 160 lb 4.4 oz (72.7 kg) IBW/kg (Calculated) : 52.4   Vital Signs:   Intake/Output from previous day: 11/24 0701 - 11/25 0700 In: 3227.3 [I.V.:3177.3; IV Piggyback:50] Out: 6475 [Urine:6475] Intake/Output from this shift: No intake/output data recorded.  Labs:  Recent Labs  10/08/16 1131 10/08/16 1952 10/09/16 0501 10/10/16 0513  WBC 31.5*  --  28.0* 12.1*  HGB 11.6*  --  11.4* 10.7*  HCT 34.1*  --  33.9* 31.0*  PLT 150  --  149* 131*  CREATININE 2.96*  --  1.54* 1.00  MG 0.8* 2.9* 2.6*  --   PHOS 2.2*  --  3.0  --   ALBUMIN 3.5  --  3.0*  --   PROT 6.8  --  6.0*  --   AST 91*  --  58*  --   ALT 69*  --  53  --   ALKPHOS 43  --  42  --   BILITOT 2.9*  --  1.7*  --    Estimated Creatinine Clearance: 51.4 mL/min (by C-G formula based on SCr of 1 mg/dL).  Cindi Carbon, PharmD, BCPS Clinical Pharmacist

## 2016-10-10 NOTE — Progress Notes (Signed)
Pt left floor in wheelchair with NT and charge RN for the  medsurgical unit. Continue on iv ABX. Reported pain medication she received earlier for pain (toradol) was effective. No further concerns at this time.

## 2016-10-10 NOTE — Progress Notes (Signed)
Pharmacy Antibiotic Note  Lori Brady is a 70 y.o. female admitted on 10/08/2016 with sepsis from PNA.  Pharmacy has been consulted piperacillin/tazobactam dosing.  Plan: Continue piperacillin/tazobactam 3.375 g EI q 8 hours. WBC decreasing.  Height: 5\' 3"  (160 cm) Weight: 160 lb 4.4 oz (72.7 kg) IBW/kg (Calculated) : 52.4  Temp (24hrs), Avg:98.3 F (36.8 C), Min:98.1 F (36.7 C), Max:98.4 F (36.9 C)   Recent Labs Lab 10/08/16 1131 10/08/16 1654 10/09/16 0501 10/09/16 1426 10/10/16 0513  WBC 31.5*  --  28.0*  --  12.1*  CREATININE 2.96*  --  1.54*  --  1.00  LATICACIDVEN 5.1* 2.4*  --  0.9  --     Estimated Creatinine Clearance: 51.4 mL/min (by C-G formula based on SCr of 1 mg/dL).    Allergies  Allergen Reactions  . Levaquin [Levofloxacin] Nausea Only  . Vicodin [Hydrocodone-Acetaminophen]     nausea   Results for orders placed or performed during the hospital encounter of 10/08/16  Blood Culture (routine x 2)     Status: None (Preliminary result)   Collection Time: 10/08/16  1:00 PM  Result Value Ref Range Status   Specimen Description BLOOD L AC  Final   Special Requests   Final    BOTTLES DRAWN AEROBIC AND ANAEROBIC AER ANA   Culture NO GROWTH < 24 HOURS  Final   Report Status PENDING  Incomplete  Blood Culture (routine x 2)     Status: None (Preliminary result)   Collection Time: 10/08/16  1:00 PM  Result Value Ref Range Status   Specimen Description BLOOD R HAND  Final   Special Requests   Final    BOTTLES DRAWN AEROBIC AND ANAEROBIC AER ANA   Culture NO GROWTH < 24 HOURS  Final   Report Status PENDING  Incomplete  Urine culture     Status: None   Collection Time: 10/08/16  1:20 PM  Result Value Ref Range Status   Specimen Description URINE, CATHETERIZED  Final   Special Requests Normal  Final   Culture NO GROWTH Performed at [facility]   Final   Report Status 10/09/2016 FINAL  Final  MRSA PCR Screening     Status: None    Collection Time: 10/08/16  4:00 PM  Result Value Ref Range Status   MRSA by PCR NEGATIVE NEGATIVE Final    Comment:        The GeneXpert MRSA Assay (FDA approved for NASAL specimens only), is one component of a comprehensive MRSA colonization surveillance program. It is not intended to diagnose MRSA infection nor to guide or monitor treatment for MRSA infections.    Thank you for allowing pharmacy to be a part of this patient's care.  Cindi Carbon, PharmD, BCPS Clinical Pharmacist 10/10/2016 9:10 AM

## 2016-10-10 NOTE — Progress Notes (Signed)
[facility] [place] at [facility]   PATIENT NAME: Lori Brady    MR#:  [id number]  DATE OF BIRTH:  [birth year]  SUBJECTIVE:   Patient is here due to sepsis secondary to right-sided pneumonia. Still having pleuritic Chest pain.  Off Vasopressors now.  BP stable.  Renal function improved.    REVIEW OF SYSTEMS:    Review of Systems  Constitutional: Negative for chills and fever.  HENT: Negative for congestion and tinnitus.   Eyes: Negative for blurred vision and double vision.  Respiratory: Positive for cough. Negative for shortness of breath and wheezing.   Cardiovascular: Positive for chest pain (right sided Pleuritic). Negative for orthopnea and PND.  Gastrointestinal: Negative for abdominal pain, diarrhea, nausea and vomiting.  Genitourinary: Negative for dysuria and hematuria.  Neurological: Positive for weakness. Negative for dizziness, sensory change and focal weakness.  All other systems reviewed and are negative.   Nutrition: 2 gm sodium Tolerating Diet: Yes but little Tolerating PT: Await Eval.    DRUG ALLERGIES:   Allergies  Allergen Reactions  . Levaquin [Levofloxacin] Nausea Only  . Vicodin [Hydrocodone-Acetaminophen]     nausea    VITALS:  Blood pressure 130/67, pulse 90, temperature 97.7 F (36.5 C), temperature source Axillary, resp. rate (!) 26, height 5\' 3"  (1.6 m), weight 72.7 kg (160 lb 4.4 oz), SpO2 100 %.  PHYSICAL EXAMINATION:   Physical Exam  GENERAL:  70 y.o.-year-old patient lying in the bed in mild resp. Distress.  EYES: Pupils equal, round, reactive to light and accommodation. No scleral icterus. Extraocular muscles intact.  HEENT: Head atraumatic, normocephalic. Oropharynx and nasopharynx clear.  NECK:  Supple, no jugular venous distention. No thyroid enlargement, no tenderness.  LUNGS: Poor Resp. Effort due to splinting, no wheezing, rales, + rhonchi at right lung base. No use of accessory muscles of respiration.   CARDIOVASCULAR: S1, S2 normal. No murmurs, rubs, or gallops.  ABDOMEN: Soft, nontender, nondistended. Bowel sounds present. No organomegaly or mass.  EXTREMITIES: No cyanosis, clubbing or edema b/l.    NEUROLOGIC: Cranial nerves II through XII are intact. No focal Motor or sensory deficits b/l.  Globally weak.  PSYCHIATRIC: The patient is alert and oriented x 3.  SKIN: No obvious rash, lesion, or ulcer.    LABORATORY PANEL:   CBC  Recent Labs Lab 10/10/16 0513  WBC 12.1*  HGB 10.7*  HCT 31.0*  PLT 131*   ------------------------------------------------------------------------------------------------------------------  Chemistries   Recent Labs Lab 10/09/16 0501 10/10/16 0513  NA 130* 132*  K 4.2 3.4*  CL 105 102  CO2 18* 23  GLUCOSE 165* 92  BUN 30* 25*  CREATININE 1.54* 1.00  CALCIUM 6.6* 6.7*  MG 2.6*  --   AST 58*  --   ALT 53  --   ALKPHOS 42  --   BILITOT 1.7*  --    ------------------------------------------------------------------------------------------------------------------  Cardiac Enzymes  Recent Labs Lab 10/08/16 1131  TROPONINI 0.03*   ------------------------------------------------------------------------------------------------------------------  RADIOLOGY:  Dg Chest Portable 1 View  Result Date: 10/08/2016 CLINICAL DATA:  Central line placement EXAM: PORTABLE CHEST 1 VIEW COMPARISON:  Earlier today FINDINGS: New right IJ central line with tip at the upper right atrium. No pneumothorax or mediastinal widening. Dense right basilar pneumonia. No effusion or cavitation visualized. Background of generalized interstitial coarsening. IMPRESSION: 1. No acute finding after central line placement. 2. Dense right basilar pneumonia. Electronically Signed   By: Marnee Spring M.D.   On: 10/08/2016 15:30   Dg Chest Portable  1 View  Result Date: 10/08/2016 CLINICAL DATA:  Increasing shortness of breath. Right lung pneumonia. EXAM: PORTABLE CHEST 1  VIEW 2:07 p.m. COMPARISON:  10/08/2016 at 11:39 a.m. FINDINGS: There has been progression of the consolidative infiltrate in the right lung base. There is new peribronchial thickening and slight interstitial accentuation on the left. Heart size and pulmonary vascularity are at the upper limits of normal. Calcification in the thoracic aorta. IMPRESSION: Slight progression of right base pneumonia. Progressive bronchitic changes on the left. Aortic atherosclerosis. Electronically Signed   By: Francene Boyers M.D.   On: 10/08/2016 14:25     ASSESSMENT AND PLAN:   70 year old female with past medical history of hypertension, hyperlipidemia, asthma, COPD, history of Crohn's disease, history of previous CVA who presents to the hospital due to nausea vomiting and diarrhea and noted to be septic  1. Sepsis-patient has ruled in by clinical markers of tachycardia, leukocytosis and a dense right lower lobe pneumonia. -Continue broad-spectrum IV antibiotics with Zosyn.  -Afebrile, hemodynamically stable. Off vasopressors now. Follow blood, sputum cultures which are so far negative.  2. Pneumonia-this is the cause of patient's sepsis.  -continue Zosyn. Follow blood, sputum cultures. -Toradol ordered for some pleurisy.  3. Leukocytosis-secondary to the sepsis/pneumonia. -Improving with treatment with antibiotics.  4. Acute kidney injury-likely ATN secondary to sepsis. -Improved and resolved with IV fluid hydration and creatinine back to baseline.  5. Hypokalemia/hypomagnesemia-much improved with supplementation  6. COPD-no acute exacerbation. Continue when necessary DuoNeb's.  7. GERD-continue Protonix.  Transfer to floor.  D/c Foley later today if possible.  Discussed plan of care with family at bedside.   All the records are reviewed and case discussed with Care Management/Social Worker. Management plans discussed with the patient, family and they are in agreement.  CODE STATUS: Full  DVT  Prophylaxis: Lovenox  TOTAL Critical Care TIME TAKING CARE OF THIS PATIENT: 30 minutes.   POSSIBLE D/C IN 2-3 DAYS, DEPENDING ON CLINICAL CONDITION.   Houston Siren M.D on 10/10/2016 at 12:33 PM  Between 7am to 6pm - Pager - [phone]  After 6pm go to [email or web address] - Social research officer, government  [facility]  Office  [phone]  CC: Primary care physician; Margaretann Loveless, PA-C

## 2016-10-11 LAB — BASIC METABOLIC PANEL
Anion gap: 6 (ref 5–15)
BUN: 15 mg/dL (ref 6–20)
CHLORIDE: 100 mmol/L — AB (ref 101–111)
CO2: 28 mmol/L (ref 22–32)
Calcium: 7.4 mg/dL — ABNORMAL LOW (ref 8.9–10.3)
Creatinine, Ser: 0.69 mg/dL (ref 0.44–1.00)
GFR calc Af Amer: >60 mL/min (ref >60)
GFR calc non Af Amer: >60 mL/min (ref >60)
GLUCOSE: 87 mg/dL (ref 65–99)
POTASSIUM: 3.5 mmol/L (ref 3.5–5.1)
Sodium: 134 mmol/L — ABNORMAL LOW (ref 135–145)

## 2016-10-11 LAB — CBC
HEMATOCRIT: 31.7 % — AB (ref 35.0–47.0)
HEMOGLOBIN: 10.9 g/dL — AB (ref 12.0–16.0)
MCH: 31.3 pg (ref 26.0–34.0)
MCHC: 34.5 g/dL (ref 32.0–36.0)
MCV: 90.8 fL (ref 80.0–100.0)
Platelets: 134 10*3/uL — ABNORMAL LOW (ref 150–440)
RBC: 3.49 MIL/uL — ABNORMAL LOW (ref 3.80–5.20)
RDW: 12.4 % (ref 11.5–14.5)
WBC: 10.9 10*3/uL (ref 3.6–11.0)

## 2016-10-11 LAB — MAGNESIUM: Magnesium: 1.4 mg/dL — ABNORMAL LOW (ref 1.7–2.4)

## 2016-10-11 MED ORDER — CALCIUM CARBONATE ANTACID 500 MG PO CHEW
5.0000 | CHEWABLE_TABLET | Freq: Once | ORAL | Status: DC
  Filled 2016-10-11: qty 5

## 2016-10-11 MED ORDER — CALCIUM CARBONATE 1250 (500 CA) MG PO TABS
1000.0000 mg | ORAL_TABLET | Freq: Once | ORAL | Status: DC
  Filled 2016-10-11: qty 2

## 2016-10-11 MED ORDER — MAGNESIUM OXIDE 400 (241.3 MG) MG PO TABS
400.0000 mg | ORAL_TABLET | Freq: Every day | ORAL | Status: DC
  Administered 2016-10-12 – 2016-10-13 (×2): 400 mg via ORAL
  Filled 2016-10-11 (×2): qty 1

## 2016-10-11 MED ORDER — MAGNESIUM SULFATE 4 GM/100ML IV SOLN
4.0000 g | Freq: Once | INTRAVENOUS | Status: AC
  Administered 2016-10-11: 4 g via INTRAVENOUS
  Filled 2016-10-11: qty 100

## 2016-10-11 MED ORDER — PANTOPRAZOLE SODIUM 40 MG PO TBEC
40.0000 mg | DELAYED_RELEASE_TABLET | Freq: Every day | ORAL | Status: DC
  Administered 2016-10-11 – 2016-10-13 (×3): 40 mg via ORAL
  Filled 2016-10-11 (×3): qty 1

## 2016-10-11 NOTE — Plan of Care (Signed)
Problem: Bowel/Gastric: Goal: Will not experience complications related to bowel motility Outcome: Adequate for Discharge Multiple BM's throughout shift.

## 2016-10-11 NOTE — Evaluation (Signed)
Physical Therapy Evaluation Patient Details Name: Lori Brady MRN: [id number] DOB: [birth year] Today's Date: 10/11/2016   History of Present Illness  70 yo female with onset of sepsis PNA, leukocytosis, low K+ and AKI was admitted, now referred to PT with resolved chest pain.    Clinical Impression  Pt is up to walk with assistance and is notably tired but has completed the tasks for gait training with no O2.  Her sats were down to 86% off 2L O2 to walk a short trip then back to 97% at rest on O2.  Will need to attempt steps and try without O2 again before recommending her to go home.  Follow acutely for these two concerns.    Follow Up Recommendations Supervision/Assistance - 24 hour;Supervision for mobility/OOB;Home health PT    Equipment Recommendations  None recommended by PT    Recommendations for Other Services       Precautions / Restrictions Precautions Precautions: Fall Restrictions Weight Bearing Restrictions: No      Mobility  Bed Mobility Overal bed mobility: Needs Assistance Bed Mobility: Supine to Sit;Sit to Supine     Supine to sit: Min assist Sit to supine: Min assist   General bed mobility comments: minor help to scoot on bed and to sit up  Transfers Overall transfer level: Needs assistance Equipment used: Rolling walker (2 wheeled);1 person hand held assist Transfers: Sit to/from [employer] Sit to Stand: Min guard;Min assist Stand pivot transfers: Min guard;Min assist       General transfer comment: reminders for hand placment  Ambulation/Gait Ambulation/Gait assistance: Min assist;Min guard Ambulation Distance (Feet): 80 Feet Assistive device: Rolling walker (2 wheeled);1 person hand held assist Gait Pattern/deviations: Step-through pattern;Shuffle;Decreased stride length;Wide base of support;Drifts right/left Gait velocity: reduced Gait velocity interpretation: Below normal speed for age/gender General Gait Details: tends  to drift to L side  Stairs            Wheelchair Mobility    Modified Rankin (Stroke Patients Only)       Balance Overall balance assessment: Needs assistance Sitting-balance support: Feet supported Sitting balance-Leahy Scale: Good   Postural control: Posterior lean Standing balance support: Bilateral upper extremity supported Standing balance-Leahy Scale: Fair                               Pertinent Vitals/Pain Pain Assessment: No/denies pain    Home Living Family/patient expects to be discharged to:: Private residence Living Arrangements: Other relatives Available Help at Discharge: Family;Available 24 hours/day Type of Home: House Home Access: Stairs to enter Entrance Stairs-Rails: Right;Left;Can reach both Entrance Stairs-Number of Steps: 6 Home Layout: One level Home Equipment: Walker - 2 wheels      Prior Function Level of Independence: Independent (used RW previously)               Hand Dominance        Extremity/Trunk Assessment   Upper Extremity Assessment: Overall WFL for tasks assessed           Lower Extremity Assessment: Generalized weakness      Cervical / Trunk Assessment: Normal  Communication   Communication: No difficulties  Cognition Arousal/Alertness: Awake/alert Behavior During Therapy: WFL for tasks assessed/performed Overall Cognitive Status: Within Functional Limits for tasks assessed                      General Comments      Exercises  Assessment/Plan    PT Assessment Patient needs continued PT services  PT Problem List Decreased strength;Decreased range of motion;Decreased activity tolerance;Decreased balance;Decreased mobility;Decreased coordination;Decreased cognition;Decreased knowledge of use of DME;Decreased safety awareness;Cardiopulmonary status limiting activity;Decreased skin integrity          PT Treatment Interventions DME instruction;Gait training;Stair  training;Functional mobility training;Therapeutic activities;Therapeutic exercise;Balance training;Neuromuscular re-education;Patient/family education    PT Goals (Current goals can be found in the Care Plan section)  Acute Rehab PT Goals Patient Stated Goal: to walk safely PT Goal Formulation: With patient/family Time For Goal Achievement: 10/25/16 Potential to Achieve Goals: Good    Frequency Min 2X/week   Barriers to discharge Decreased caregiver support;Inaccessible home environment Pt is in need of physical assistance to use the RW    Co-evaluation               End of Session Equipment Utilized During Treatment: Gait belt;Oxygen Activity Tolerance: Patient tolerated treatment well Patient left: in bed;with call bell/phone within reach;with bed alarm set;with family/visitor present Nurse Communication: Mobility status         Time: [id number] PT Time Calculation (min) (ACUTE ONLY): 28 min   Charges:   PT Evaluation $PT Eval Moderate Complexity: 1 Procedure PT Treatments $Gait Training: 8-22 mins   PT G Codes:        Ivar DrapeStout, Fran Neiswonger E 10/11/2016, 4:03 PM   Samul Dadauth Danney Bungert, PT MS Acute Rehab Dept. Number: [facility] [phone] and [facility] [phone]

## 2016-10-11 NOTE — Progress Notes (Signed)
[facility] - [place] at [facility]   PATIENT NAME: Lori Brady    MR#:  [id number]  DATE OF BIRTH:  13-Jul-1946  SUBJECTIVE:   Pleuritic CP much improved since yesterday. Still has a cough but non-productive. Overall feels much better.   REVIEW OF SYSTEMS:    Review of Systems  Constitutional: Negative for chills and fever.  HENT: Negative for congestion and tinnitus.   Eyes: Negative for blurred vision and double vision.  Respiratory: Positive for cough. Negative for shortness of breath and wheezing.   Cardiovascular: Positive for chest pain (right sided Pleuritic). Negative for orthopnea and PND.  Gastrointestinal: Negative for abdominal pain, diarrhea, nausea and vomiting.  Genitourinary: Negative for dysuria and hematuria.  Neurological: Positive for weakness. Negative for dizziness, sensory change and focal weakness.  All other systems reviewed and are negative.   Nutrition: 2 gm sodium Tolerating Diet: Yes but little Tolerating PT: Await Eval.    DRUG ALLERGIES:   Allergies  Allergen Reactions  . Levaquin [Levofloxacin] Nausea Only  . Vicodin [Hydrocodone-Acetaminophen]     nausea    VITALS:  Blood pressure 138/79, pulse 87, temperature 97.9 F (36.6 C), temperature source Oral, resp. rate 18, height 5\' 3"  (1.6 m), weight 72.7 kg (160 lb 4.4 oz), SpO2 99 %.  PHYSICAL EXAMINATION:   Physical Exam  GENERAL:  70 y.o.-year-old patient lying in the bed in NAD.  EYES: Pupils equal, round, reactive to light and accommodation. No scleral icterus. Extraocular muscles intact.  HEENT: Head atraumatic, normocephalic. Oropharynx and nasopharynx clear.  NECK:  Supple, no jugular venous distention. No thyroid enlargement, no tenderness.  LUNGS: Poor Resp. Effort due to splinting, no wheezing, rales, + rhonchi at right lung base. No use of accessory muscles of respiration.  CARDIOVASCULAR: S1, S2 normal. No murmurs, rubs, or gallops.  ABDOMEN: Soft,  nontender, nondistended. Bowel sounds present. No organomegaly or mass.  EXTREMITIES: No cyanosis, clubbing or edema b/l.    NEUROLOGIC: Cranial nerves II through XII are intact. No focal Motor or sensory deficits b/l.  Globally weak.  PSYCHIATRIC: The patient is alert and oriented x 3.  SKIN: No obvious rash, lesion, or ulcer.    LABORATORY PANEL:   CBC  Recent Labs Lab 10/11/16 0604  WBC 10.9  HGB 10.9*  HCT 31.7*  PLT 134*   ------------------------------------------------------------------------------------------------------------------  Chemistries   Recent Labs Lab 10/09/16 0501  10/11/16 0604  NA 130*  < > 134*  K 4.2  < > 3.5  CL 105  < > 100*  CO2 18*  < > 28  GLUCOSE 165*  < > 87  BUN 30*  < > 15  CREATININE 1.54*  < > 0.69  CALCIUM 6.6*  < > 7.4*  MG 2.6*  --  1.4*  AST 58*  --   --   ALT 53  --   --   ALKPHOS 42  --   --   BILITOT 1.7*  --   --   < > = values in this interval not displayed. ------------------------------------------------------------------------------------------------------------------  Cardiac Enzymes  Recent Labs Lab 10/08/16 1131  TROPONINI 0.03*   ------------------------------------------------------------------------------------------------------------------  RADIOLOGY:  No results found.   ASSESSMENT AND PLAN:   70 year old female with past medical history of hypertension, hyperlipidemia, asthma, COPD, history of Crohn's disease, history of previous CVA who presents to the hospital due to nausea vomiting and diarrhea and noted to be septic  1. Sepsis- due to RLL pneumonia.  -Continue IV Zosyn.  -  Afebrile, hemodynamically stable. Off vasopressors now. Follow blood, sputum cultures which are so far negative.  2. Pneumonia-this is the cause of patient's sepsis.  -continue Zosyn. Cultures so far (-). Pleurisy improved w/ Toradol.   3. Leukocytosis-secondary to the sepsis/pneumonia. -resolved with treatment with  antibiotics.  4. Acute kidney injury-likely ATN secondary to sepsis. -Improved and resolved with IV fluid hydration and creatinine back to baseline.  5. Hypokalemia/hypomagnesemia-much improved with supplementation  6. COPD-no acute exacerbation. Continue when necessary DuoNeb's.  7. GERD-continue Protonix.  Await PT eval and likely d/c home tomorrow.   All the records are reviewed and case discussed with Care Management/Social Worker. Management plans discussed with the patient, family and they are in agreement.  CODE STATUS: Full  DVT Prophylaxis: Lovenox  TOTAL Critical Care TIME TAKING CARE OF THIS PATIENT: 30 minutes.   POSSIBLE D/C IN 1-2 DAYS, DEPENDING ON CLINICAL CONDITION.   Houston Siren M.D on 10/11/2016 at 1:11 PM  Between 7am to 6pm - Pager - [phone]  After 6pm go to [email or web address] - Social research officer, government  [facility] [place] [facility]  Office  [phone]  CC: Primary care physician; Margaretann Loveless, PA-C

## 2016-10-11 NOTE — Progress Notes (Signed)
Key Points: Use following P&T approved IV to PO antibiotic change policy.  Description contains the criteria that are approved Note: Policy Excludes:  Esophagectomy patientsPHARMACIST - PHYSICIAN COMMUNICATION DR:   Cherlynn Kaiser CONCERNING: IV to Oral Route Change Policy  RECOMMENDATION: This patient is receiving pantoprazole by the intravenous route.  Based on criteria approved by the Pharmacy and Therapeutics Committee, the intravenous medication(s) is/are being converted to the equivalent oral dose form(s).   DESCRIPTION: These criteria include:  The patient is eating (either orally or via tube) and/or has been taking other orally administered medications for a least 24 hours  The patient has no evidence of active gastrointestinal bleeding or impaired GI absorption (gastrectomy, short bowel, patient on TNA or NPO).  If you have questions about this conversion, please contact the Pharmacy Department    Cindi Carbon, [facility], [facility], [facility] 10/11/2016 8:01 AM

## 2016-10-11 NOTE — Progress Notes (Signed)
MEDICATION RELATED CONSULT NOTE - INITIAL   Pharmacy Consulted to assist in electrolyte management in this 52 yoF admitted with septic shock.   Goals of therapy: Electrolytes WNL  Assessment: K = 3.5 WNL Corrected Ca = 8.4 Mg = 1.4  Plan: Magnesium 4 g IV once. Will start mag-ox 400 mg PO daily tomorrow morning  Calcium carbonate 1000 mg once  Will recheck electrolytes with AM labs tomorrow.  Allergies  Allergen Reactions  . Levaquin [Levofloxacin] Nausea Only  . Vicodin [Hydrocodone-Acetaminophen]     nausea   Patient Measurements: Height: 5\' 3"  (160 cm) Weight: 160 lb 4.4 oz (72.7 kg) IBW/kg (Calculated) : 52.4  Vital Signs: Temp: 97.9 F (36.6 C) (11/26 0430) Temp Source: Oral (11/26 0430) BP: 138/79 (11/26 0430) Pulse Rate: 87 (11/26 0430) Intake/Output from previous day: 11/25 0701 - 11/26 0700 In: 290 [P.O.:240; IV Piggyback:50] Out: 1201 [Urine:1200; Stool:1] Intake/Output from this shift: No intake/output data recorded.  Labs:  Recent Labs  10/08/16 1131 10/08/16 1952 10/09/16 0501 10/10/16 0513 10/11/16 0604  WBC 31.5*  --  28.0* 12.1* 10.9  HGB 11.6*  --  11.4* 10.7* 10.9*  HCT 34.1*  --  33.9* 31.0* 31.7*  PLT 150  --  149* 131* 134*  CREATININE 2.96*  --  1.54* 1.00 0.69  MG 0.8* 2.9* 2.6*  --  1.4*  PHOS 2.2*  --  3.0  --   --   ALBUMIN 3.5  --  3.0*  --   --   PROT 6.8  --  6.0*  --   --   AST 91*  --  58*  --   --   ALT 69*  --  53  --   --   ALKPHOS 43  --  42  --   --   BILITOT 2.9*  --  1.7*  --   --    Estimated Creatinine Clearance: 64.3 mL/min (by C-G formula based on SCr of 0.69 mg/dL).  Cindi Carbon, PharmD, BCPS Clinical Pharmacist  10/11/16 7:57 AM

## 2016-10-12 LAB — BASIC METABOLIC PANEL
Anion gap: 8 (ref 5–15)
BUN: 8 mg/dL (ref 6–20)
CHLORIDE: 93 mmol/L — AB (ref 101–111)
CO2: 32 mmol/L (ref 22–32)
Calcium: 8.1 mg/dL — ABNORMAL LOW (ref 8.9–10.3)
Creatinine, Ser: 0.57 mg/dL (ref 0.44–1.00)
GFR calc Af Amer: >60 mL/min (ref >60)
GFR calc non Af Amer: >60 mL/min (ref >60)
Glucose, Bld: 112 mg/dL — ABNORMAL HIGH (ref 65–99)
POTASSIUM: 3.1 mmol/L — AB (ref 3.5–5.1)
SODIUM: 133 mmol/L — AB (ref 135–145)

## 2016-10-12 LAB — MAGNESIUM: Magnesium: 1.4 mg/dL — ABNORMAL LOW (ref 1.7–2.4)

## 2016-10-12 LAB — PHOSPHORUS: Phosphorus: 3.4 mg/dL (ref 2.5–4.6)

## 2016-10-12 MED ORDER — POTASSIUM CHLORIDE CRYS ER 10 MEQ PO TBCR
20.0000 meq | EXTENDED_RELEASE_TABLET | ORAL | Status: AC
  Administered 2016-10-12 (×2): 20 meq via ORAL
  Filled 2016-10-12 (×2): qty 2

## 2016-10-12 MED ORDER — AZITHROMYCIN 500 MG PO TABS
500.0000 mg | ORAL_TABLET | Freq: Every day | ORAL | Status: AC
  Administered 2016-10-12: 500 mg via ORAL
  Filled 2016-10-12: qty 1

## 2016-10-12 MED ORDER — AZITHROMYCIN 250 MG PO TABS
250.0000 mg | ORAL_TABLET | Freq: Every day | ORAL | Status: DC
  Administered 2016-10-13: 250 mg via ORAL
  Filled 2016-10-12: qty 1

## 2016-10-12 MED ORDER — METHYLPREDNISOLONE SODIUM SUCC 40 MG IJ SOLR
40.0000 mg | Freq: Every day | INTRAMUSCULAR | Status: DC
  Administered 2016-10-12 – 2016-10-13 (×2): 40 mg via INTRAVENOUS
  Filled 2016-10-12 (×2): qty 1

## 2016-10-12 MED ORDER — BUDESONIDE 0.25 MG/2ML IN SUSP
0.2500 mg | Freq: Two times a day (BID) | RESPIRATORY_TRACT | Status: DC
  Administered 2016-10-12 – 2016-10-13 (×3): 0.25 mg via RESPIRATORY_TRACT
  Filled 2016-10-12 (×3): qty 2

## 2016-10-12 MED ORDER — MAGNESIUM SULFATE 2 GM/50ML IV SOLN
2.0000 g | Freq: Once | INTRAVENOUS | Status: AC
  Administered 2016-10-12: 2 g via INTRAVENOUS
  Filled 2016-10-12: qty 50

## 2016-10-12 MED ORDER — IPRATROPIUM-ALBUTEROL 0.5-2.5 (3) MG/3ML IN SOLN
3.0000 mL | Freq: Four times a day (QID) | RESPIRATORY_TRACT | Status: DC
  Administered 2016-10-12 – 2016-10-13 (×5): 3 mL via RESPIRATORY_TRACT
  Filled 2016-10-12 (×6): qty 3

## 2016-10-12 MED ORDER — POTASSIUM CHLORIDE CRYS ER 20 MEQ PO TBCR: 40.0000 meq | EXTENDED_RELEASE_TABLET | Freq: Once | ORAL | Status: DC

## 2016-10-12 NOTE — Progress Notes (Signed)
MEDICATION RELATED CONSULT NOTE -Follow up  Pharmacy Consulted to assist in electrolyte management in this 15 yoF admitted with septic shock.   Goals of therapy: Electrolytes WNL  Assessment: K = 3.1 Ca=8.1  Albumin= 3.0  Corrected Ca = 8.9 Mg = 1.4  Plan: Patient on Mag-OX 400mg  po daily to start today 11/27. Will order Magnesium 2 g IV once and KCL PO q2hr x 2 doses.   Will recheck electrolytes with AM labs tomorrow.    Allergies  Allergen Reactions  . Levaquin [Levofloxacin] Nausea Only  . Vicodin [Hydrocodone-Acetaminophen]     nausea   Patient Measurements: Height: 5\' 3"  (160 cm) Weight: 160 lb 4.4 oz (72.7 kg) IBW/kg (Calculated) : 52.4  Vital Signs: Temp: 98.1 F (36.7 C) (11/27 0512) Temp Source: Oral (11/27 0512) BP: 149/80 (11/27 0512) Pulse Rate: 88 (11/27 0512) Intake/Output from previous day: 11/26 0701 - 11/27 0700 In: 650 [P.O.:600; IV Piggyback:50] Out: 1700 [Urine:1700] Intake/Output from this shift: Total I/O In: -  Out: 400 [Urine:400]  Labs:  Recent Labs  10/10/16 0513 10/11/16 0604 10/12/16 0607  WBC 12.1* 10.9  --   HGB 10.7* 10.9*  --   HCT 31.0* 31.7*  --   PLT 131* 134*  --   CREATININE 1.00 0.69 0.57  MG  --  1.4* 1.4*  PHOS  --   --  3.4   Lab Results  Component Value Date   K 3.1 (L) 10/12/2016   Estimated Creatinine Clearance: 64.3 mL/min (by C-G formula based on SCr of 0.57 mg/dL).  Angelique Blonder, PharmD, BCPS Clinical Pharmacist  10/12/16 7:45 AM

## 2016-10-12 NOTE — Progress Notes (Signed)
Patient ID: Lori Brady, female   DOB: [birth year], 70 y.o.   MRN: [id number]  [facility] PROGRESS NOTE  Lori Brady NID:[id number] DOB: 11/12/1946 DOA: 10/08/2016 PCP: Mar Daring, PA-C  HPI/Subjective: Patient seen earlier this morning. Not feeling well. Still very weak. Still with cough and shortness of breath. Patient does not wear oxygen at home.  Objective: Vitals:   10/12/16 1343 10/12/16 1448  BP: (!) 174/80 (!) 148/86  Pulse: 93   Resp: 18   Temp: 98 F (36.7 C)     Filed Weights   10/08/16 1127 10/08/16 1600  Weight: 68 kg (150 lb) 72.7 kg (160 lb 4.4 oz)    ROS: Review of Systems  Constitutional: Negative for chills and fever.  Eyes: Negative for blurred vision.  Respiratory: Positive for cough, shortness of breath and wheezing.   Cardiovascular: Negative for chest pain.  Gastrointestinal: Negative for abdominal pain, constipation, diarrhea, nausea and vomiting.  Genitourinary: Negative for dysuria.  Musculoskeletal: Negative for joint pain.  Neurological: Negative for dizziness and headaches.   Exam: Physical Exam  Constitutional: She is oriented to person, place, and time.  HENT:  Nose: No mucosal edema.  Mouth/Throat: No oropharyngeal exudate or posterior oropharyngeal edema.  Eyes: Conjunctivae, EOM and lids are normal. Pupils are equal, round, and reactive to light.  Neck: No JVD present. Carotid bruit is not present. No edema present. No thyroid mass and no thyromegaly present.  Cardiovascular: S1 normal and S2 normal.  Exam reveals no gallop.   No murmur heard. Pulses:      Dorsalis pedis pulses are 2+ on the right side, and 2+ on the left side.  Respiratory: No respiratory distress. She has decreased breath sounds in the right middle field, the right lower field, the left middle field and the left lower field. She has wheezes in the right lower field and the left lower field. She has no rhonchi. She has no rales.  GI: Soft. Bowel  sounds are normal. There is no tenderness.  Musculoskeletal:       Right ankle: She exhibits no swelling.       Left ankle: She exhibits no swelling.  Lymphadenopathy:    She has no cervical adenopathy.  Neurological: She is alert and oriented to person, place, and time. No cranial nerve deficit.  Skin: Skin is warm. No rash noted. Nails show no clubbing.  Psychiatric: She has a normal mood and affect.      Data Reviewed: Basic Metabolic Panel:  Recent Labs Lab 10/08/16 1131 10/08/16 1952 10/08/16 2311 10/09/16 0501 10/10/16 0513 10/11/16 0604 10/12/16 0607  NA 131*  --   --  130* 132* 134* 133*  K 3.1*  --  4.0 4.2 3.4* 3.5 3.1*  CL 94*  --   --  105 102 100* 93*  CO2 21*  --   --  18* 23 28 32  GLUCOSE 116*  --   --  165* 92 87 112*  BUN 32*  --   --  30* 25* 15 8  CREATININE 2.96*  --   --  1.54* 1.00 0.69 0.57  CALCIUM 8.6*  --   --  6.6* 6.7* 7.4* 8.1*  MG 0.8* 2.9*  --  2.6*  --  1.4* 1.4*  PHOS 2.2*  --   --  3.0  --   --  3.4   Liver Function Tests:  Recent Labs Lab 10/08/16 1131 10/09/16 0501  AST 91* 58*  ALT 69*  53  ALKPHOS 43 42  BILITOT 2.9* 1.7*  PROT 6.8 6.0*  ALBUMIN 3.5 3.0*   CBC:  Recent Labs Lab 10/08/16 1131 10/09/16 0501 10/10/16 0513 10/11/16 0604  WBC 31.5* 28.0* 12.1* 10.9  NEUTROABS 27.5* 23.2*  --   --   HGB 11.6* 11.4* 10.7* 10.9*  HCT 34.1* 33.9* 31.0* 31.7*  MCV 93.8 93.7 93.8 90.8  PLT 150 149* 131* 134*   Cardiac Enzymes:  Recent Labs Lab 10/08/16 1131  TROPONINI 0.03*     Recent Results (from the past 240 hour(s))  Blood Culture (routine x 2)     Status: None (Preliminary result)   Collection Time: 10/08/16  1:00 PM  Result Value Ref Range Status   Specimen Description BLOOD L AC  Final   Special Requests   Final    BOTTLES DRAWN AEROBIC AND ANAEROBIC AER 11ML ANA 9ML   Culture NO GROWTH 4 DAYS  Final   Report Status PENDING  Incomplete  Blood Culture (routine x 2)     Status: None (Preliminary result)    Collection Time: 10/08/16  1:00 PM  Result Value Ref Range Status   Specimen Description BLOOD R HAND  Final   Special Requests   Final    BOTTLES DRAWN AEROBIC AND ANAEROBIC AER 6ML ANA 7ML   Culture NO GROWTH 4 DAYS  Final   Report Status PENDING  Incomplete  Urine culture     Status: None   Collection Time: 10/08/16  1:20 PM  Result Value Ref Range Status   Specimen Description URINE, CATHETERIZED  Final   Special Requests Normal  Final   Culture NO GROWTH Performed at [facility]   Final   Report Status 10/09/2016 FINAL  Final  MRSA PCR Screening     Status: None   Collection Time: 10/08/16  4:00 PM  Result Value Ref Range Status   MRSA by PCR NEGATIVE NEGATIVE Final    Comment:        The GeneXpert MRSA Assay (FDA approved for NASAL specimens only), is one component of a comprehensive MRSA colonization surveillance program. It is not intended to diagnose MRSA infection nor to guide or monitor treatment for MRSA infections.   Culture, expectorated sputum-assessment     Status: None   Collection Time: 10/10/16 12:25 PM  Result Value Ref Range Status   Specimen Description SPU  Final   Special Requests NONE  Final   Sputum evaluation THIS SPECIMEN IS ACCEPTABLE FOR SPUTUM CULTURE  Final   Report Status 10/10/2016 FINAL  Final  Culture, respiratory (NON-Expectorated)     Status: None (Preliminary result)   Collection Time: 10/10/16 12:25 PM  Result Value Ref Range Status   Specimen Description SPU  Final   Special Requests NONE Reflexed from [id number]  Final   Gram Stain   Final    FEW WBC PRESENT,BOTH PMN AND MONONUCLEAR RARE SQUAMOUS EPITHELIAL CELLS PRESENT FEW BUDDING YEAST SEEN RARE GRAM VARIABLE ROD Performed at [facility]  Final   Report Status PENDING  Incomplete      Scheduled Meds: . [START ON 10/13/2016] azithromycin  250 mg Oral Daily  . budesonide (PULMICORT) nebulizer solution  0.25 mg  Nebulization BID  . calcium carbonate  5 tablet Oral Once  . enoxaparin (LOVENOX) injection  40 mg Subcutaneous Q24H  . ipratropium-albuterol  3 mL Nebulization Q6H  . magnesium oxide  400 mg Oral Daily  . methylPREDNISolone (SOLU-MEDROL)  injection  40 mg Intravenous Daily  . pantoprazole  40 mg Oral Daily  . piperacillin-tazobactam  3.375 g Intravenous Q8H  . sodium chloride flush  10-40 mL Intracatheter Q12H  . sodium chloride flush  3 mL Intravenous Q12H    Assessment/Plan:  1. Septic shock due to right lower lobe pneumonia. Patient's antibiotics have been switched around numerous times during the hospital course. Continue Zosyn but add Zithromax to cover atypicals. Off pressors at this point. So far cultures are negative. 2. COPD exacerbation with Poor air entry and wheeze. Add Solu-Medrol, DuoNeb nebulizer solution every 6 hours and budesonide nebulizers. 3. Acute hypoxic respiratory failure. Try to get off oxygen get a pulse ox on room air. 4. Weakness. Physical therapy evaluation recommended home with home health. 5. GERD on Protonix  Code Status:     Code Status Orders        Start     Ordered   10/08/16 1556  Full code  Continuous     10/08/16 1555    Code Status History    Date Active Date Inactive Code Status Order ID Comments User Context   This patient has a current code status but no historical code status.     Disposition Plan: Home once moving better air  Antibiotics:  Zosyn  Zithromax  Time spent: 28 minutes  [place], [place]

## 2016-10-13 ENCOUNTER — Encounter: Payer: Self-pay | Admitting: *Deleted

## 2016-10-13 ENCOUNTER — Emergency Department
Admission: EM | Admit: 2016-10-13 | Discharge: 2016-10-13 | Disposition: A | Discharge status: Home / Self Care | Payer: Medicare Other | Attending: Emergency Medicine | Admitting: Emergency Medicine

## 2016-10-13 ENCOUNTER — Telehealth: Payer: Self-pay | Admitting: Physician Assistant

## 2016-10-13 DIAGNOSIS — Z79899 Other long term (current) drug therapy: Secondary | ICD-10-CM | POA: Insufficient documentation

## 2016-10-13 DIAGNOSIS — J441 Chronic obstructive pulmonary disease with (acute) exacerbation: Secondary | ICD-10-CM | POA: Insufficient documentation

## 2016-10-13 DIAGNOSIS — Z87891 Personal history of nicotine dependence: Secondary | ICD-10-CM | POA: Insufficient documentation

## 2016-10-13 DIAGNOSIS — Z7982 Long term (current) use of aspirin: Secondary | ICD-10-CM | POA: Insufficient documentation

## 2016-10-13 DIAGNOSIS — J45909 Unspecified asthma, uncomplicated: Secondary | ICD-10-CM | POA: Insufficient documentation

## 2016-10-13 DIAGNOSIS — I10 Essential (primary) hypertension: Secondary | ICD-10-CM

## 2016-10-13 HISTORY — DX: Sepsis, unspecified organism: A41.9

## 2016-10-13 HISTORY — DX: Pneumonia, unspecified organism: J18.9

## 2016-10-13 LAB — CULTURE, BLOOD (ROUTINE X 2)
Culture: NO GROWTH
Culture: NO GROWTH

## 2016-10-13 LAB — BASIC METABOLIC PANEL
ANION GAP: 10 (ref 5–15)
BUN: 10 mg/dL (ref 6–20)
CO2: 29 mmol/L (ref 22–32)
Calcium: 9.1 mg/dL (ref 8.9–10.3)
Chloride: 94 mmol/L — ABNORMAL LOW (ref 101–111)
Creatinine, Ser: 0.62 mg/dL (ref 0.44–1.00)
GLUCOSE: 126 mg/dL — AB (ref 65–99)
POTASSIUM: 3.5 mmol/L (ref 3.5–5.1)
Sodium: 133 mmol/L — ABNORMAL LOW (ref 135–145)

## 2016-10-13 LAB — CULTURE, RESPIRATORY

## 2016-10-13 LAB — MAGNESIUM: MAGNESIUM: 1.6 mg/dL — AB (ref 1.7–2.4)

## 2016-10-13 LAB — CULTURE, RESPIRATORY W GRAM STAIN

## 2016-10-13 LAB — GLUCOSE, CAPILLARY: Glucose-Capillary: 121 mg/dL — ABNORMAL HIGH (ref 65–99)

## 2016-10-13 MED ORDER — POTASSIUM CHLORIDE CRYS ER 20 MEQ PO TBCR
40.0000 meq | EXTENDED_RELEASE_TABLET | Freq: Once | ORAL | Status: AC
  Administered 2016-10-13: 40 meq via ORAL
  Filled 2016-10-13: qty 2

## 2016-10-13 MED ORDER — POTASSIUM CHLORIDE CRYS ER 20 MEQ PO TBCR: 20.0000 meq | EXTENDED_RELEASE_TABLET | Freq: Every day | ORAL | Status: DC

## 2016-10-13 MED ORDER — PREDNISONE 5 MG PO TABS: ORAL_TABLET | ORAL | 0 refills | Status: DC

## 2016-10-13 MED ORDER — MAGNESIUM SULFATE 2 GM/50ML IV SOLN
2.0000 g | Freq: Once | INTRAVENOUS | Status: AC
  Administered 2016-10-13: 2 g via INTRAVENOUS
  Filled 2016-10-13: qty 50

## 2016-10-13 MED ORDER — NYSTATIN 100000 UNIT/ML MT SUSP
5.0000 mL | Freq: Four times a day (QID) | OROMUCOSAL | Status: DC
  Administered 2016-10-13: 500000 [IU] via ORAL
  Filled 2016-10-13: qty 5

## 2016-10-13 MED ORDER — NYSTATIN 100000 UNIT/ML MT SUSP: 5.0000 mL | Freq: Four times a day (QID) | OROMUCOSAL | 0 refills | Status: DC

## 2016-10-13 MED ORDER — CEFUROXIME AXETIL 500 MG PO TABS: 500.0000 mg | ORAL_TABLET | Freq: Two times a day (BID) | ORAL | 0 refills | Status: DC

## 2016-10-13 MED ORDER — MAGNESIUM SULFATE 2 GM/50ML IV SOLN
2.0000 g | Freq: Once | INTRAVENOUS | Status: DC
  Filled 2016-10-13: qty 50

## 2016-10-13 MED ORDER — LISINOPRIL 20 MG PO TABS
20.0000 mg | ORAL_TABLET | Freq: Once | ORAL | Status: AC
  Administered 2016-10-13: 20 mg via ORAL
  Filled 2016-10-13: qty 1

## 2016-10-13 MED ORDER — AZITHROMYCIN 250 MG PO TABS: ORAL_TABLET | ORAL | 0 refills | Status: DC

## 2016-10-13 MED ORDER — MAGNESIUM OXIDE 400 (241.3 MG) MG PO TABS: 400.0000 mg | ORAL_TABLET | Freq: Every day | ORAL | 0 refills | Status: DC

## 2016-10-13 MED ORDER — CEFUROXIME AXETIL 500 MG PO TABS
500.0000 mg | ORAL_TABLET | Freq: Two times a day (BID) | ORAL | Status: DC
  Filled 2016-10-13: qty 1

## 2016-10-13 NOTE — Progress Notes (Signed)
Pt alert and and in good spirits. Expecting discharge today. CH offered prayer.   10/13/16 1140  Clinical Encounter Type  Visited With Patient  Visit Type Initial  Referral From Nurse  Spiritual Encounters  Spiritual Needs Prayer;Emotional  Stress Factors  Patient Stress Factors None identified

## 2016-10-13 NOTE — Discharge Instructions (Signed)
Community-Acquired Pneumonia, Adult Introduction Pneumonia is an infection of the lungs. One type of pneumonia can happen while a person is in a hospital. A different type can happen when a person is not in a hospital (community-acquired pneumonia). It is easy for this kind to spread from person to person. It can spread to you if you breathe near an infected person who coughs or sneezes. Some symptoms include:  A dry cough.  A wet (productive) cough.  Fever.  Sweating.  Chest pain. Follow these instructions at home:  Take over-the-counter and prescription medicines only as told by your doctor.  Only take cough medicine if you are losing sleep.  If you were prescribed an antibiotic medicine, take it as told by your doctor. Do not stop taking the antibiotic even if you start to feel better.  Sleep with your head and neck raised (elevated). You can do this by putting a few pillows under your head, or you can sleep in a recliner.  Do not use tobacco products. These include cigarettes, chewing tobacco, and e-cigarettes. If you need help quitting, ask your doctor.  Drink enough water to keep your pee (urine) clear or pale yellow. A shot (vaccine) can help prevent pneumonia. Shots are often suggested for:  People older than 68 years of age.  People older than 68 years of age:  Who are having cancer treatment.  Who have long-term (chronic) lung disease.  Who have problems with their body's defense system (immune system). You may also prevent pneumonia if you take these actions:  Get the flu (influenza) shot every year.  Go to the dentist as often as told.  Wash your hands often. If soap and water are not available, use hand sanitizer. Contact a doctor if:  You have a fever.  You lose sleep because your cough medicine does not help. Get help right away if:  You are short of breath and it gets worse.  You have more chest pain.  Your sickness gets worse. This is very  serious if:  You are an older adult.  Your body's defense system is weak.  You cough up blood. This information is not intended to replace advice given to you by your health care provider. Make sure you discuss any questions you have with your health care provider. Document Released: 04/20/2008 Document Revised: 04/09/2016 Document Reviewed: 02/27/2015  2017 Elsevier

## 2016-10-13 NOTE — Telephone Encounter (Signed)
Please review. KW 

## 2016-10-13 NOTE — Progress Notes (Signed)
Patient is discharged home with sister, BP before discharge was 190/90 patient was asymptomatic.  Dr Hilton Sinclair made aware and he ordered a one time dose of lisinopril 20mg  and for patient's BP to be checked in an hour and discharge because BP medications had been held during this hospitalization due to the patient's diagnosis of sepsis.  BP post lisinopril was 188/87.  Patient was discharged per Dr Cristine Polio order.

## 2016-10-13 NOTE — Care Management Important Message (Signed)
Important Message  Patient Details  Name: JOSHLIN ICARD MRN: [id number] Date of Birth: 1946/05/02   Medicare Important Message Given:  Yes    Chapman Fitch, RN 10/13/2016, 1:57 PM

## 2016-10-13 NOTE — ED Notes (Signed)
Pt denies CP, SOB, n/v/d, fevers, dizziness or falls.  Pt sts that BP was elevated at DC today and today at home.  NAD.

## 2016-10-13 NOTE — Progress Notes (Signed)
Physical Therapy Treatment Patient Details Name: Lori Brady MRN: [id number] DOB: 12-21-1945 Today's Date: 10/13/2016    History of Present Illness 70 yo female with onset of sepsis PNA, leukocytosis, low K+ and AKI was admitted, now referred to PT with resolved chest pain.      PT Comments    Pt is making good progress towards goals with increased ambulation distance noted without use of AD. Pt able to navigate stairs without difficulty or SOB symptoms. Pt is at baseline level and does not require further PT needs. Will sign off.  Follow Up Recommendations  No PT follow up     Equipment Recommendations  None recommended by PT    Recommendations for Other Services       Precautions / Restrictions Precautions Precautions: Fall Restrictions Weight Bearing Restrictions: No    Mobility  Bed Mobility               General bed mobility comments: received on EOB upon arrival  Transfers Overall transfer level: Modified independent Equipment used: None Transfers: Sit to/from Stand Sit to Stand: Supervision         General transfer comment: safe technique performed with RW. Upright posture noted  Ambulation/Gait Ambulation/Gait assistance: Min guard Ambulation Distance (Feet): 280 Feet Assistive device: None Gait Pattern/deviations: Step-through pattern     General Gait Details: ambulated using reciprocal gait pattern. Safe technique performed with no LOB. All ambulation performed on room air with sats at 95% and no SOB symptoms noted. Towards ends of ambulation, she fatigues and reaches for hand rail.   Stairs Stairs: Yes Stairs assistance: Min guard Stair Management: Step to pattern;One rail Right Number of Stairs: 5 General stair comments: navigated up/down 5 stairs with 1 rail with safe technique. No SOB noted and O2 sats WNL  Wheelchair Mobility    Modified Rankin (Stroke Patients Only)       Balance                                     Cognition Arousal/Alertness: Awake/alert Behavior During Therapy: WFL for tasks assessed/performed Overall Cognitive Status: Within Functional Limits for tasks assessed                      Exercises      General Comments        Pertinent Vitals/Pain Pain Assessment: No/denies pain    Home Living                      Prior Function            PT Goals (current goals can now be found in the care plan section) Acute Rehab PT Goals Patient Stated Goal: to walk safely PT Goal Formulation: All assessment and education complete, DC therapy Time For Goal Achievement: 10/25/16 Potential to Achieve Goals: Good Progress towards PT goals: Goals met/education completed, patient discharged from PT    Frequency           PT Plan Discharge plan needs to be updated    Co-evaluation             End of Session Equipment Utilized During Treatment: Gait belt Activity Tolerance: Patient tolerated treatment well Patient left: in bed     Time: 0950-1000 PT Time Calculation (min) (ACUTE ONLY): 10 min  Charges:  $Gait Training: 8-22 mins  G Codes:      Lori Brady 10/13/2016, 12:55 PM Greggory Stallion, PT, DPT [phone]

## 2016-10-13 NOTE — Progress Notes (Signed)
MEDICATION RELATED CONSULT NOTE -Follow up  Pharmacy Consulted to assist in electrolyte management in this 52 yoF admitted with septic shock.   Goals of therapy: Electrolytes WNL  Assessment: K = 3.5 Ca=9.1  Albumin= 3.0  Corrected Ca = 12.06 Mg = 1.6  Plan: Patient on Mag-OX 468m po daily which started on 11/27. Magnesium still low at 1.6. Will order Magnesium 2 g IV once.   Will recheck electrolytes with AM labs tomorrow.    Allergies  Allergen Reactions  . Levaquin [Levofloxacin] Nausea Only  . Vicodin [Hydrocodone-Acetaminophen]     nausea   Patient Measurements: Height: 5' 3"  (160 cm) Weight: 160 lb 4.4 oz (72.7 kg) IBW/kg (Calculated) : 52.4  Vital Signs: Temp: 97.9 F (36.6 C) (11/28 0602) Temp Source: Oral (11/28 0602) BP: 147/70 (11/28 0602) Pulse Rate: 84 (11/28 0602) Intake/Output from previous day: 11/27 0701 - 11/28 0700 In: 660 [P.O.:660] Out: 650 [Urine:650] Intake/Output from this shift: No intake/output data recorded.  Labs:  Recent Labs  10/11/16 0604 10/12/16 0607 10/13/16 0349  WBC 10.9  --   --   HGB 10.9*  --   --   HCT 31.7*  --   --   PLT 134*  --   --   CREATININE 0.69 0.57 0.62  MG 1.4* 1.4* 1.6*  PHOS  --  3.4  --    Lab Results  Component Value Date   K 3.5 10/13/2016   Estimated Creatinine Clearance: 64.3 mL/min (by C-G formula based on SCr of 0.62 mg/dL).  KLoree Fee PharmD 10/13/16 7:14 AM

## 2016-10-13 NOTE — Discharge Summary (Signed)
[facility] [place] NAME: Lori Brady    MR#:  [id number]  DATE OF BIRTH:  05/21/1946  DATE OF ADMISSION:  10/08/2016 ADMITTING PHYSICIAN: Nicholes Mango, MD  DATE OF DISCHARGE: 10/13/2016  3:14 PM  PRIMARY CARE PHYSICIAN: [facility]   ADMISSION DIAGNOSIS:  Hypomagnesemia [E83.42] AKI (acute kidney injury) ([place]) [N17.9] Septic shock ([place]) [A41.9, R65.21] Sepsis, due to unspecified organism ([place]) [A41.9] Community acquired pneumonia of right lower lobe of lung ([place]) [J18.1]  DISCHARGE DIAGNOSIS:  Active Problems:   Septic shock ([place])   SECONDARY DIAGNOSIS:   Past Medical History:  Diagnosis Date  . Arthritis   . Asthma   . Crohn's disease ([place])   . Emphysema of lung ([place])   . GERD (gastroesophageal reflux disease)   . History of chicken pox   . History of heart attack   . Hx of completed stroke   . Hypercholesteremia   . Hypertension   . Seasonal allergies   . UTI (lower urinary tract infection)     HOSPITAL COURSE:   1. Septic shock due to right lower lobe pneumonia. The patient's antibiotics were tapered around numerous times during the hospital course. When I saw the patient the patient was currently on Zosyn but I did add Zithromax to cover atypicals. The patient was initially on pressors. Cultures are negative. Switched over to [employer] and Zithromax upon discharge home. 2. COPD exacerbation. Patient improved greatly when I added Solu-Medrol, DuoNeb nebulizers and budesonide nebulizers. Patient can go back on her inhalers at home and a prednisone taper. 3. Acute hypoxic respiratory failure. Patient doing well and off oxygen 4. Weakness. Did well with physical therapy 5. Essential hypertension blood pressure increased on the day of discharge and I had her restart her lisinopril. Chlorthalidone was discontinued secondary to electrolyte abnormalities 6. GERD on Protonix 7. Acute kidney injury with dehydration.  This has improved during the hospital course. 8. Hypomagnesemia this was replaced during the hospital course 9. Hypokalemia this was also replaced during the hospital course  DISCHARGE CONDITIONS:   Satisfactory  CONSULTS OBTAINED:   none  DRUG ALLERGIES:   Allergies  Allergen Reactions  . Levaquin [Levofloxacin] Nausea Only  . Vicodin [Hydrocodone-Acetaminophen]     nausea    DISCHARGE MEDICATIONS:   Discharge Medication List as of 10/13/2016 12:06 PM    START taking these medications   Details  azithromycin (ZITHROMAX) 250 MG tablet One tab daily for three days, Print    cefUROXime (CEFTIN) 500 MG tablet Take 1 tablet (500 mg total) by mouth 2 (two) times daily with a meal., Starting Tue 10/13/2016, Print    magnesium oxide (MAG-OX) 400 (241.3 Mg) MG tablet Take 1 tablet (400 mg total) by mouth daily., Starting Wed 10/14/2016, Print    nystatin (MYCOSTATIN) 100000 UNIT/ML suspension Take 5 mLs (500,000 Units total) by mouth 4 (four) times daily., Starting Tue 10/13/2016, Print    predniSONE (DELTASONE) 5 MG tablet 4 tabs po day1; 3 tabs po day2; 2 tabs po day3; 1 tab po day4,5 then stop, Print      CONTINUE these medications which have CHANGED   Details  potassium chloride SA (K-DUR,KLOR-CON) 20 MEQ tablet Take 1 tablet (20 mEq total) by mouth daily., Starting Tue 10/13/2016, No Print      CONTINUE these medications which have NOT CHANGED   Details  albuterol (ACCUNEB) 0.63 MG/3ML nebulizer solution Take 1 ampule by nebulization every 6 (six) hours as needed for  wheezing., Historical Med    alendronate (FOSAMAX) 70 MG tablet Take 1 tablet (70 mg total) by mouth once a week., Starting Wed 07/15/2016, Normal    aspirin 81 MG tablet Take 81 mg by mouth daily., Historical Med    betamethasone valerate (VALISONE) 0.1 % cream APPLY TOPICALLY TO AFFECTED AREA SPARINGLY 2 TIMES DAILY., Normal    Cholecalciferol (VITAMIN D) 2000 UNITS CAPS Take 1 capsule by mouth daily.,  Historical Med    citalopram (CELEXA) 20 MG tablet TAKE 1 TABLET BY MOUTH ONCE DAILY., Normal    doxepin (SINEQUAN) 25 MG capsule Take 25 mg by mouth at bedtime. , Starting Mon 01/06/2016, Historical Med    fluticasone (FLONASE) 50 MCG/ACT nasal spray USE 2 SPRAYS IN EACH NOSTRIL ONCE DAILY., Normal    Fluticasone Furoate-Vilanterol (BREO ELLIPTA) 100-25 MCG/INH AEPB Inhale 1 puff into the lungs daily. , Historical Med    lisinopril (PRINIVIL,ZESTRIL) 20 MG tablet Take 20 mg by mouth daily. , Starting Wed 01/01/2016, Historical Med    LORazepam (ATIVAN) 0.5 MG tablet Take 1 tablet (0.5 mg total) by mouth every 8 (eight) hours as needed. for anxiety, Starting Mon 05/25/2016, Print    montelukast (SINGULAIR) 10 MG tablet Take 1 tablet (10 mg total) by mouth daily., Starting Wed 06/10/2016, Normal    Omega-3 Fatty Acids (FISH OIL) 1000 MG CAPS Take 1 capsule by mouth daily., Historical Med    omeprazole (PRILOSEC) 40 MG capsule Reported on 05/28/2016, Starting Thu 04/30/2016, Historical Med    PROVENTIL HFA 108 (90 Base) MCG/ACT inhaler Inhale 1-2 puffs into the lungs every 4 (four) hours as needed. , Starting Fri 01/03/2016, Historical Med    simvastatin (ZOCOR) 40 MG tablet TAKE 1 TABLET BY MOUTH AT BEDTIME., Normal    tiotropium (SPIRIVA) 18 MCG inhalation capsule Place 1 capsule (18 mcg total) into inhaler and inhale daily., Starting Mon 08/05/2015, Normal    triamcinolone cream (KENALOG) 0.1 % Apply 1 application topically daily. , Starting Wed 12/11/2015, Historical Med      STOP taking these medications     chlorthalidone (HYGROTON) 25 MG tablet          DISCHARGE INSTRUCTIONS:   Follow-up with [facility] one week Follow-up with Dr. Devona Konig 2 weeks  If you experience worsening of your admission symptoms, develop shortness of breath, life threatening emergency, suicidal or homicidal thoughts you must seek medical attention immediately by calling 911 or calling  your MD immediately  if symptoms less severe.  You Must read complete instructions/literature along with all the possible adverse reactions/side effects for all the Medicines you take and that have been prescribed to you. Take any new Medicines after you have completely understood and accept all the possible adverse reactions/side effects.   Please note  You were cared for by a hospitalist during your hospital stay. If you have any questions about your discharge medications or the care you received while you were in the hospital after you are discharged, you can call the unit and asked to speak with the hospitalist on call if the hospitalist that took care of you is not available. Once you are discharged, your primary care physician will handle any further medical issues. Please note that NO REFILLS for any discharge medications will be authorized once you are discharged, as it is imperative that you return to your primary care physician (or establish a relationship with a primary care physician if you do not have one) for your aftercare needs so  that they can reassess your need for medications and monitor your lab values.    Today   CHIEF COMPLAINT:   Chief Complaint  Patient presents with  . Nausea  . Emesis  . Diarrhea  . Hypotension    HISTORY OF PRESENT ILLNESS:  Lori Brady  is a 70 y.o. female presented with nausea vomiting diarrhea and hypotension found to be in septic shock   VITAL SIGNS:  Blood pressure (!) 188/87, pulse 93, temperature 97.9 F (36.6 C), temperature source Oral, resp. rate 17, height 5' 3"  (1.6 m), weight 72.7 kg (160 lb 4.4 oz), SpO2 96 %. BP this am 147/70.  lood pressure earlier in the am,Blood pressure early in the morning was 147/70  PHYSICAL EXAMINATION:  GENERAL:  70 y.o.-year-old patient lying in the bed with no acute distress.  EYES: Pupils equal, round, reactive to light and accommodation. No scleral icterus. Extraocular muscles intact.  HEENT:  Head atraumatic, normocephalic. Oropharynx and nasopharynx clear.  NECK:  Supple, no jugular venous distention. No thyroid enlargement, no tenderness.  LUNGS: decreased breath sounds bilaterally but bette air entry today, no wheezing, rales,rhonchi or crepitation. No use of accessory muscles of respiration.  CARDIOVASCULAR: S1, S2 normal. No murmurs, rubs, or gallops.  ABDOMEN: Soft, non-tender, non-distended. Bowel sounds present. No organomegaly or mass.  EXTREMITIES: trace edema, cyanosis, or clubbing.  NEUROLOGIC: Cranial nerves II through XII are intact. Muscle strength 5/5 in all extremities. Sensation intact. Gait not checked.  PSYCHIATRIC: The patient is alert and oriented x 3.  SKIN: No obvious rash, lesion, or ulcer.   DATA REVIEW:   CBC  Recent Labs Lab 10/11/16 0604  WBC 10.9  HGB 10.9*  HCT 31.7*  PLT 134*    Chemistries   Recent Labs Lab 10/09/16 0501  10/13/16 0349  NA 130*  < > 133*  K 4.2  < > 3.5  CL 105  < > 94*  CO2 18*  < > 29  GLUCOSE 165*  < > 126*  BUN 30*  < > 10  CREATININE 1.54*  < > 0.62  CALCIUM 6.6*  < > 9.1  MG 2.6*  < > 1.6*  AST 58*  --   --   ALT 53  --   --   ALKPHOS 42  --   --   BILITOT 1.7*  --   --   < > = values in this interval not displayed.  Cardiac Enzymes  Recent Labs Lab 10/08/16 1131  TROPONINI 0.03*    Microbiology Results  Results for orders placed or performed during the hospital encounter of 10/08/16  Blood Culture (routine x 2)     Status: None   Collection Time: 10/08/16  1:00 PM  Result Value Ref Range Status   Specimen Description BLOOD L AC  Final   Special Requests   Final    BOTTLES DRAWN AEROBIC AND ANAEROBIC AER 11ML ANA 9ML   Culture NO GROWTH 5 DAYS  Final   Report Status 10/13/2016 FINAL  Final  Blood Culture (routine x 2)     Status: None   Collection Time: 10/08/16  1:00 PM  Result Value Ref Range Status   Specimen Description BLOOD R HAND  Final   Special Requests   Final    BOTTLES  DRAWN AEROBIC AND ANAEROBIC AER 6ML ANA 7ML   Culture NO GROWTH 5 DAYS  Final   Report Status 10/13/2016 FINAL  Final  Urine culture     Status:  None   Collection Time: 10/08/16  1:20 PM  Result Value Ref Range Status   Specimen Description URINE, CATHETERIZED  Final   Special Requests Normal  Final   Culture NO GROWTH Performed at [facility]   Final   Report Status 10/09/2016 FINAL  Final  MRSA PCR Screening     Status: None   Collection Time: 10/08/16  4:00 PM  Result Value Ref Range Status   MRSA by PCR NEGATIVE NEGATIVE Final    Comment:        The GeneXpert MRSA Assay (FDA approved for NASAL specimens only), is one component of a comprehensive MRSA colonization surveillance program. It is not intended to diagnose MRSA infection nor to guide or monitor treatment for MRSA infections.   Culture, expectorated sputum-assessment     Status: None   Collection Time: 10/10/16 12:25 PM  Result Value Ref Range Status   Specimen Description SPU  Final   Special Requests NONE  Final   Sputum evaluation THIS SPECIMEN IS ACCEPTABLE FOR SPUTUM CULTURE  Final   Report Status 10/10/2016 FINAL  Final  Culture, respiratory (NON-Expectorated)     Status: None   Collection Time: 10/10/16 12:25 PM  Result Value Ref Range Status   Specimen Description SPU  Final   Special Requests NONE Reflexed from [id number]  Final   Gram Stain   Final    FEW WBC PRESENT,BOTH PMN AND MONONUCLEAR RARE SQUAMOUS EPITHELIAL CELLS PRESENT FEW BUDDING YEAST SEEN RARE GRAM VARIABLE ROD Performed at [facility]  Final   Report Status 10/13/2016 FINAL  Final    Management plans discussed with the patient, and she is in agreement.  CODE STATUS:     Code Status Orders        Start     Ordered   10/08/16 1556  Full code  Continuous     10/08/16 1555    Code Status History    Date Active Date Inactive Code Status Order ID Comments User Context    This patient has a current code status but no historical code status.      TOTAL TIME TAKING CARE OF THIS PATIENT: 35 minutes.    Loletha Grayer M.D on 10/13/2016 at 5:25 PM  Between 7am to 6pm - Pager - [phone]  After 6pm go to [email or web address] - Proofreader  [employer] [facility]  [phone]  CC: Primary care physician; [facility] Pulmonologist Dr. Devona Konig

## 2016-10-13 NOTE — Telephone Encounter (Signed)
Pt is scheduled with Tawanna Sat for Hospital F/U on 10/19/16 @ 1:30 pm. Pt is being discharged from hospital today and was treated for septic shock. Thanks TNP

## 2016-10-13 NOTE — ED Provider Notes (Signed)
[facility] Emergency Department Provider Note   ____________________________________________   First MD Initiated Contact with Patient 10/13/16 2327     (approximate)  I have reviewed the triage vital signs and the nursing notes.   HISTORY  Chief Complaint Hypertension    HPI GIAHNA DINA is a 70 y.o. female who comes into the hospital today with high blood pressure. The patient reports that she was discharged from the hospital this afternoon. The patient was previously admitted with sepsis and pneumonia. She was hypotensive at the time and was in the intensive care unit. She reports that today as they were getting her ready to discharge her blood pressure was noted to be 199/90. They gave her a dose of lisinopril and delayed her discharge for about an hour and a half. On recheck it was 188/90 social history discharge to home. When they got home this evening they were checking her blood pressure about every 30 minutes. It went back up and was around 200/100. The patient's family was concerned because she's had a stroke in the past so they decided to bring her into the hospital for evaluation. The patient took a second dose of lisinopril. They tried to call her primary care physician and did call back the hospital. She was told to come back in to get checked out again. The patient reports that she's had no chest pain, no headache, no dizziness, no nausea, no vomiting, no blurred vision. She states that she did feel a little hot earlier but feels well now. The patient is here tonight for evaluation of her blood pressure.   Past Medical History:  Diagnosis Date  . Arthritis   . Asthma   . Crohn's disease (HCC)   . Emphysema of lung (HCC)   . GERD (gastroesophageal reflux disease)   . History of chicken pox   . History of heart attack   . Hx of completed stroke   . Hypercholesteremia   . Hypertension   . Pneumonia   . Seasonal allergies   . Sepsis (HCC)     . UTI (lower urinary tract infection)     Patient Active Problem List   Diagnosis Date Noted  . Septic shock (HCC) 10/08/2016  . Asthma without status asthmaticus 03/31/2016  . Osteoporosis 03/31/2016  . Elevated LFTs 09/17/2015  . Parotid swelling 09/13/2015  . COPD exacerbation (HCC) 09/13/2015  . Shortness of breath 09/13/2015  . Urticaria 09/13/2015  . Cerebrovascular accident (CVA) (HCC) 08/21/2015  . Senile purpura (HCC) 08/21/2015  . Atrial tachycardia (HCC) 08/13/2015  . Eczema 07/10/2015  . Allergic rhinitis 06/26/2015  . Hyperkalemia 05/03/2015  . Anxiety 04/23/2015  . Edema leg 03/27/2015  . Fatigue 03/06/2015  . Primary cardiomyopathy (HCC) 03/05/2015  . Benign essential HTN 02/28/2015  . Combined fat and carbohydrate induced hyperlipemia 09/06/2014  . COPD, severe (HCC) 08/01/2014  . Acid reflux 08/01/2014  . Cough 08/01/2014    Past Surgical History:  Procedure Laterality Date  . BREAST BIOPSY Bilateral 1960's to 90's    Prior to Admission medications   Medication Sig Start Date End Date Taking? Authorizing Provider  albuterol (ACCUNEB) 0.63 MG/3ML nebulizer solution Take 1 ampule by nebulization every 6 (six) hours as needed for wheezing.    Historical Provider, MD  alendronate (FOSAMAX) 70 MG tablet Take 1 tablet (70 mg total) by mouth once a week. Patient taking differently: Take 70 mg by mouth once a week. mondays 07/15/16   Malva Limes, MD  aspirin 81 MG tablet Take 81 mg by mouth daily.    Historical Provider, MD  azithromycin (ZITHROMAX) 250 MG tablet One tab daily for three days 10/14/16   Alford Highlandichard Wieting, MD  betamethasone valerate (VALISONE) 0.1 % cream APPLY TOPICALLY TO AFFECTED AREA SPARINGLY 2 TIMES DAILY. 07/10/15   Lorie PhenixNancy Maloney, MD  cefUROXime (CEFTIN) 500 MG tablet Take 1 tablet (500 mg total) by mouth 2 (two) times daily with a meal. 10/13/16   Alford Highlandichard Wieting, MD  Cholecalciferol (VITAMIN D) 2000 UNITS CAPS Take 1 capsule by mouth  daily.    Historical Provider, MD  citalopram (CELEXA) 20 MG tablet TAKE 1 TABLET BY MOUTH ONCE DAILY. 08/07/16   Margaretann LovelessJennifer M Burnette, PA-C  doxepin (SINEQUAN) 25 MG capsule Take 25 mg by mouth at bedtime.  01/06/16   Historical Provider, MD  fluticasone (FLONASE) 50 MCG/ACT nasal spray USE 2 SPRAYS IN EACH NOSTRIL ONCE DAILY. 04/09/16   Lorie PhenixNancy Maloney, MD  Fluticasone Furoate-Vilanterol (BREO ELLIPTA) 100-25 MCG/INH AEPB Inhale 1 puff into the lungs daily.     Historical Provider, MD  lisinopril (PRINIVIL,ZESTRIL) 20 MG tablet Take 20 mg by mouth daily.  01/01/16   Historical Provider, MD  LORazepam (ATIVAN) 0.5 MG tablet Take 1 tablet (0.5 mg total) by mouth every 8 (eight) hours as needed. for anxiety 05/25/16   Anola Gurneyobert Chauvin, PA  magnesium oxide (MAG-OX) 400 (241.3 Mg) MG tablet Take 1 tablet (400 mg total) by mouth daily. 10/14/16   Alford Highlandichard Wieting, MD  montelukast (SINGULAIR) 10 MG tablet Take 1 tablet (10 mg total) by mouth daily. 06/10/16   Margaretann LovelessJennifer M Burnette, PA-C  nystatin (MYCOSTATIN) 100000 UNIT/ML suspension Take 5 mLs (500,000 Units total) by mouth 4 (four) times daily. 10/13/16   Alford Highlandichard Wieting, MD  Omega-3 Fatty Acids (FISH OIL) 1000 MG CAPS Take 1 capsule by mouth daily.    Historical Provider, MD  omeprazole (PRILOSEC) 40 MG capsule Reported on 05/28/2016 04/30/16   Historical Provider, MD  potassium chloride SA (K-DUR,KLOR-CON) 20 MEQ tablet Take 1 tablet (20 mEq total) by mouth daily. 10/13/16   Alford Highlandichard Wieting, MD  predniSONE (DELTASONE) 5 MG tablet 4 tabs po day1; 3 tabs po day2; 2 tabs po day3; 1 tab po day4,5 then stop 10/13/16   Alford Highlandichard Wieting, MD  PROVENTIL HFA 108 [id number](90 Base) MCG/ACT inhaler Inhale 1-2 puffs into the lungs every 4 (four) hours as needed.  01/03/16   Historical Provider, MD  simvastatin (ZOCOR) 40 MG tablet TAKE 1 TABLET BY MOUTH AT BEDTIME. 01/01/16   Lorie PhenixNancy Maloney, MD  tiotropium (SPIRIVA) 18 MCG inhalation capsule Place 1 capsule (18 mcg total) into inhaler and  inhale daily. 08/05/15   Merwyn Katosavid B Simonds, MD  triamcinolone cream (KENALOG) 0.1 % Apply 1 application topically daily.  12/11/15   Historical Provider, MD    Allergies Levaquin [levofloxacin] and Vicodin [hydrocodone-acetaminophen]  Family History  Problem Relation Age of Onset  . Cancer Sister     ovarian/uterine/breast  . Breast cancer Sister [id number]  . Cancer Sister     breast  . Heart disease Sister   . Breast cancer Sister [id number]  . Heart disease Father   . Cancer Father     liver  . Cancer Mother   . Heart disease Brother   . Cancer Brother     liver  . Heart disease Sister   . Diabetes Maternal Grandmother     Social History Social History  Substance Use Topics  . Smoking status: Former Smoker  Packs/day: 1.00    Years: 30.00    Types: Cigarettes    Quit date: 08/01/2008  . Smokeless tobacco: Never Used  . Alcohol use Yes     Comment: occasional    Review of Systems Constitutional: No fever/chills Eyes: No visual changes. ENT: No sore throat. Cardiovascular: Denies chest pain. Respiratory: Denies shortness of breath. Gastrointestinal: No abdominal pain.  No nausea, no vomiting.  No diarrhea.  No constipation. Genitourinary: Negative for dysuria. Musculoskeletal: Negative for back pain. Skin: Negative for rash. Neurological: Negative for headaches, focal weakness or numbness.  10-point ROS otherwise negative.  ____________________________________________   PHYSICAL EXAM:  VITAL SIGNS: ED Triage Vitals  Enc Vitals Group     BP 10/13/16 2130 (!) 177/90     Pulse Rate 10/13/16 2130 95     Resp 10/13/16 2130 16     Temp 10/13/16 2130 98.4 F (36.9 C)     Temp Source 10/13/16 2130 Oral     SpO2 10/13/16 2130 94 %     Weight 10/13/16 2131 160 lb (72.6 kg)     Height 10/13/16 2131 5\' 3"  (1.6 m)     Head Circumference --      Peak Flow --      Pain Score 10/13/16 2233 0     Pain Loc --      Pain Edu? --      Excl. in GC? --     Constitutional: Alert  and oriented. Well appearing and in no acute distress. Eyes: Conjunctivae are normal. PERRL. EOMI. Head: Atraumatic. Nose: No congestion/rhinnorhea. Mouth/Throat: Mucous membranes are moist.  Oropharynx non-erythematous. Cardiovascular: Normal rate, regular rhythm. Grossly normal heart sounds.  Good peripheral circulation. Respiratory: Normal respiratory effort.  No retractions. Lungs CTAB. Gastrointestinal: Soft and nontender. No distention. Positive bowel sounds Musculoskeletal: No lower extremity tenderness nor edema.   Neurologic:  Normal speech and language.  Skin:  Skin is warm, dry and intact. Bruises all over bilateral arms and hands. Psychiatric: Mood and affect are normal. Speech and behavior are normal.  ____________________________________________   LABS (all labs ordered are listed, but only abnormal results are displayed)  Labs Reviewed - No data to display ____________________________________________  EKG  none ____________________________________________  RADIOLOGY  none ____________________________________________   PROCEDURES  Procedure(s) performed: None  Procedures  Critical Care performed: No  ____________________________________________   INITIAL IMPRESSION / ASSESSMENT AND PLAN / ED COURSE  Pertinent labs & imaging results that were available during my care of the patient were reviewed by me and considered in my medical decision making (see chart for details).  This is a 70 year old female who comes into the hospital today with asymptomatic hypertension. The patient was discharged from the hospital earlier today with this hypertension but was concerned. Since the patient was septic previously with hypotension her blood pressure medications were discontinued. I informed her that since she is still in the acute phase of her illness they likely want to slowly restart her medications and slowly lower her blood pressure. The patient's blood pressure  when I'm evaluating her at this time is 153/82 and then 157/82. The patient's blood pressure did come down with the second dose of medication. It appears that the patient had had a few elevated blood pressures earlier. The patient also had some blood work done earlier prior to her discharge. I do not feel the patient needs any intervention at this time. I have counseled the patient on taking her medication and contacting her physician who can  slowly begin to restart her blood pressure medications. The patient and her son understand and agree with this plan. The patient's blood pressure is much improved from previous. The patient be discharged home.  Clinical Course      ____________________________________________   FINAL CLINICAL IMPRESSION(S) / ED DIAGNOSES  Final diagnoses:  Hypertension, unspecified type      NEW MEDICATIONS STARTED DURING THIS VISIT:  Discharge Medication List as of 10/13/2016 11:46 PM       Note:  This document was prepared using Dragon voice recognition software and may include unintentional dictation errors.    Rebecka Apley, MD 10/14/16 [phone]

## 2016-10-13 NOTE — ED Triage Notes (Signed)
Pt d/c'd from hospital today. Pt presents w/ hypertension. Pt was discharged w/ high blood pressure, given lisinopril prior to discharge. Pt states she took a second lisinopril 20 mg at 2000 tonight when her B/P remained elevated.

## 2016-10-13 NOTE — Telephone Encounter (Signed)
This is fine 

## 2016-10-19 ENCOUNTER — Ambulatory Visit
Admission: RE | Admit: 2016-10-19 | Discharge: 2016-10-19 | Disposition: A | Discharge status: Home / Self Care | Payer: Medicare Other | Source: Ambulatory Visit | Attending: Physician Assistant | Admitting: Physician Assistant

## 2016-10-19 ENCOUNTER — Encounter: Payer: Self-pay | Admitting: Physician Assistant

## 2016-10-19 ENCOUNTER — Ambulatory Visit (INDEPENDENT_AMBULATORY_CARE_PROVIDER_SITE_OTHER): Payer: Medicare Other | Admitting: Physician Assistant

## 2016-10-19 VITALS — BP 112/52 | HR 107 | Temp 98.0°F | Resp 15 | Wt 147.6 lb

## 2016-10-19 DIAGNOSIS — R0781 Pleurodynia: Secondary | ICD-10-CM

## 2016-10-19 DIAGNOSIS — R918 Other nonspecific abnormal finding of lung field: Secondary | ICD-10-CM | POA: Insufficient documentation

## 2016-10-19 DIAGNOSIS — R091 Pleurisy: Secondary | ICD-10-CM | POA: Diagnosis not present

## 2016-10-19 DIAGNOSIS — R7989 Other specified abnormal findings of blood chemistry: Secondary | ICD-10-CM

## 2016-10-19 DIAGNOSIS — J181 Lobar pneumonia, unspecified organism: Principal | ICD-10-CM

## 2016-10-19 DIAGNOSIS — J189 Pneumonia, unspecified organism: Secondary | ICD-10-CM

## 2016-10-19 DIAGNOSIS — M940 Chondrocostal junction syndrome [Tietze]: Secondary | ICD-10-CM | POA: Diagnosis not present

## 2016-10-19 DIAGNOSIS — I10 Essential (primary) hypertension: Secondary | ICD-10-CM | POA: Diagnosis not present

## 2016-10-19 MED ORDER — PREDNISONE 10 MG PO TABS: ORAL_TABLET | ORAL | 0 refills | Status: DC

## 2016-10-19 MED ORDER — CHLORTHALIDONE 25 MG PO TABS: 25.0000 mg | ORAL_TABLET | Freq: Every day | ORAL | 0 refills | Status: DC

## 2016-10-19 MED ORDER — CEFUROXIME AXETIL 500 MG PO TABS: 500.0000 mg | ORAL_TABLET | Freq: Two times a day (BID) | ORAL | 0 refills | Status: DC

## 2016-10-19 NOTE — Progress Notes (Addendum)
Patient: Lori Brady Female    DOB: 10-Mar-1946   70 y.o.   MRN: [id number] Visit Date: 10/19/2016  Today's Provider: Mar Daring, PA-C   Chief Complaint  Patient presents with  . Hospitalization Follow-up   Subjective:    HPI  Follow up Hospitalization  Patient was admitted to [facility] on 10/08/16 and discharged on 10/13/16. She was treated for Septic shock,Pneumonia,acute kidney 2nd to dehydraion. Treatment for this included Zpak,Nystatin and Ceftin, prednisone. She reports excellent compliance with treatment. She reports this condition is Improved. She reports her BP this morning was 110/70's. She also went back to the ER on 10/13/16 for High blood pressure.  She reports that they doubled her blood pressure medication when she went back to the hospital with elevated blood pressure following discharge. Since discharge she has been taking her chlorthalidone 25 mg daily and lisinopril 20 mg twice daily instead of once daily.  She is feeling very fatigued, chest feels heavy. Back hurts on the right side radiates to the front of her chest. This started again over the last 2 days. She denies fevers, chills, nausea, vomiting. She does report she is having difficulty taking deep breath due to the sharp pain that radiates from the right side mid back and front of her chest. She denies any coughing. ------------------------------------------------------------------------------------    Allergies  Allergen Reactions  . Levaquin [Levofloxacin] Nausea Only  . Vicodin [Hydrocodone-Acetaminophen]     nausea     Current Outpatient Prescriptions:  .  albuterol (ACCUNEB) 0.63 MG/3ML nebulizer solution, Take 1 ampule by nebulization every 6 (six) hours as needed for wheezing., Disp: , Rfl:  .  alendronate (FOSAMAX) 70 MG tablet, Take 1 tablet (70 mg total) by mouth once a week., Disp: 12 tablet, Rfl: 4 .  aspirin 81 MG tablet, Take 81 mg by mouth daily., Disp: , Rfl:  .   betamethasone valerate (VALISONE) 0.1 % cream, APPLY TOPICALLY TO AFFECTED AREA SPARINGLY 2 TIMES DAILY., Disp: 15 g, Rfl: 5 .  Cholecalciferol (VITAMIN D) 2000 UNITS CAPS, Take 1 capsule by mouth daily., Disp: , Rfl:  .  citalopram (CELEXA) 20 MG tablet, TAKE 1 TABLET BY MOUTH ONCE DAILY., Disp: 90 tablet, Rfl: 1 .  doxepin (SINEQUAN) 25 MG capsule, Take 25 mg by mouth at bedtime. , Disp: , Rfl:  .  fluticasone (FLONASE) 50 MCG/ACT nasal spray, USE 2 SPRAYS IN EACH NOSTRIL ONCE DAILY., Disp: 16 g, Rfl: 11 .  Fluticasone Furoate-Vilanterol (BREO ELLIPTA) 100-25 MCG/INH AEPB, Inhale 1 puff into the lungs daily. , Disp: , Rfl:  .  lisinopril (PRINIVIL,ZESTRIL) 20 MG tablet, Take 20 mg by mouth daily. , Disp: , Rfl:  .  LORazepam (ATIVAN) 0.5 MG tablet, Take 1 tablet (0.5 mg total) by mouth every 8 (eight) hours as needed. for anxiety, Disp: 90 tablet, Rfl: 5 .  magnesium oxide (MAG-OX) 400 (241.3 Mg) MG tablet, Take 1 tablet (400 mg total) by mouth daily., Disp: 30 tablet, Rfl: 0 .  montelukast (SINGULAIR) 10 MG tablet, Take 1 tablet (10 mg total) by mouth daily., Disp: 90 tablet, Rfl: 1 .  Omega-3 Fatty Acids (FISH OIL) 1000 MG CAPS, Take 1 capsule by mouth daily., Disp: , Rfl:  .  omeprazole (PRILOSEC) 40 MG capsule, Reported on 05/28/2016, Disp: , Rfl:  .  potassium chloride SA (K-DUR,KLOR-CON) 20 MEQ tablet, Take 1 tablet (20 mEq total) by mouth daily., Disp: , Rfl:  .  PROVENTIL HFA 108 (90  Base) MCG/ACT inhaler, Inhale 1-2 puffs into the lungs every 4 (four) hours as needed. , Disp: , Rfl:  .  simvastatin (ZOCOR) 40 MG tablet, TAKE 1 TABLET BY MOUTH AT BEDTIME., Disp: 90 tablet, Rfl: 3 .  tiotropium (SPIRIVA) 18 MCG inhalation capsule, Place 1 capsule (18 mcg total) into inhaler and inhale daily., Disp: 90 capsule, Rfl: 1 .  triamcinolone cream (KENALOG) 0.1 %, Apply 1 application topically daily. , Disp: , Rfl:  .  azithromycin (ZITHROMAX) 250 MG tablet, One tab daily for three days (Patient not  taking: Reported on 10/19/2016), Disp: 3 each, Rfl: 0 .  cefUROXime (CEFTIN) 500 MG tablet, Take 1 tablet (500 mg total) by mouth 2 (two) times daily with a meal. (Patient not taking: Reported on 10/19/2016), Disp: 6 tablet, Rfl: 0 .  nystatin (MYCOSTATIN) 100000 UNIT/ML suspension, Take 5 mLs (500,000 Units total) by mouth 4 (four) times daily. (Patient not taking: Reported on 10/19/2016), Disp: 60 mL, Rfl: 0 .  predniSONE (DELTASONE) 5 MG tablet, 4 tabs po day1; 3 tabs po day2; 2 tabs po day3; 1 tab po day4,5 then stop (Patient not taking: Reported on 10/19/2016), Disp: 11 tablet, Rfl: 0  Review of Systems  Constitutional: Positive for activity change and fatigue. Negative for appetite change, chills and fever.  HENT: Negative.   Respiratory: Positive for chest tightness. Negative for cough, shortness of breath and wheezing.   Cardiovascular: Negative for chest pain, palpitations and leg swelling.  Gastrointestinal: Negative for abdominal pain and nausea.  Musculoskeletal: Positive for back pain.  Neurological: Negative for dizziness and headaches.    Social History  Substance Use Topics  . Smoking status: Former Smoker    Packs/day: 1.00    Years: 30.00    Types: Cigarettes    Quit date: 08/01/2008  . Smokeless tobacco: Never Used  . Alcohol use Yes     Comment: occasional   Objective:   BP (!) 112/52 (BP Location: Right Arm, Patient Position: Sitting, Cuff Size: Normal) Comment: 100/40 recheck  Pulse (!) 107   Temp 98 F (36.7 C) (Oral)   Resp 15   Wt 147 lb 9.6 oz (67 kg)   SpO2 97%   BMI 26.15 kg/m   Physical Exam  Constitutional: She appears well-developed and well-nourished. No distress.  HENT:  Head: Normocephalic and atraumatic.  Right Ear: Hearing, tympanic membrane, external ear and ear canal normal.  Left Ear: Hearing, tympanic membrane, external ear and ear canal normal.  Nose: Nose normal.  Mouth/Throat: Uvula is midline, oropharynx is clear and moist and mucous  membranes are normal. No oropharyngeal exudate.  Eyes: Conjunctivae are normal. Pupils are equal, round, and reactive to light. Right eye exhibits no discharge. Left eye exhibits no discharge. No scleral icterus.  Neck: Normal range of motion. Neck supple. No tracheal deviation present. No thyromegaly present.  Cardiovascular: Normal rate, regular rhythm and normal heart sounds.  Exam reveals no gallop and no friction rub.   No murmur heard. Pulmonary/Chest: Effort normal. No stridor. No respiratory distress. She has decreased breath sounds (throughout secondary to pain). She has no wheezes. She has no rhonchi. She has no rales.  Lymphadenopathy:    She has no cervical adenopathy.  Skin: Skin is warm and dry. She is not diaphoretic.  Vitals reviewed.      Assessment & Plan:     1. Essential hypertension Patient's blood pressure is really low today in the office which could be contributing to her not feeling well.  Advised her to start taking lisinopril 20 mg only once daily and may continue chlorthalidone 25 mg once daily. I will check labs as below and follow-up pending results. She is to call the office if her blood pressure starts to elevate or if it continues to run low. I will see her back in 2 weeks for blood pressure recheck. - chlorthalidone (HYGROTON) 25 MG tablet; Take 1 tablet (25 mg total) by mouth daily.  Dispense: 30 tablet; Refill: 0 - CBC w/Diff/Platelet - Comprehensive Metabolic Panel (CMET)  2. Pneumonia of right lower lobe due to infectious organism [facility]) She has completed her antibiotic and prednisone courses. I will check labs as below and follow-up pending results. She is continuing to have some right-sided back pain that radiates to the chest. I will get a chest x-ray as below to r/o recurrent pneumonia vs pleurisy and follow-up pending results. - CBC w/Diff/Platelet - Comprehensive Metabolic Panel (CMET) - DG Chest 2 View; Future  3. Pleurodynia See above medical  treatment plan. - CBC w/Diff/Platelet - Comprehensive Metabolic Panel (CMET) - DG Chest 2 View; Future  4. Hypomagnesemia Low magnesium while hospitalized. Is currently on a magnesium supplement. Will recheck labs and follow-up pending results. - CBC w/Diff/Platelet - Comprehensive Metabolic Panel (CMET) - Magnesium  5. Pleurisy Worsening symptoms which I feel to be pleurisy versus recurrent pneumonia. I will give a prednisone taper as below due to the fact that the patient is unable to take nonsteroidal anti-inflammatories secondary to Crohn disease. I have ordered a chest x-ray and will follow-up pending these results. There is an infusion or recurrent pneumonia I will send in an antibiotic treatment to her pharmacy on file. I will see her back in 2 weeks to see how she is doing. She is to call the office in the meantime if she develops worsening pain or shortness of breath. - predniSONE (DELTASONE) 10 MG tablet; Take 6 tabs PO on day 1&2, 5 tabs PO on day 3&4, 4 tabs PO on day 5&6, 3 tabs PO on day 7&8, 2 tabs PO on day 9&10, 1 tab PO on day 11&12.  Dispense: 42 tablet; Refill: 0  6. Costochondritis See above medical treatment plan. - predniSONE (DELTASONE) 10 MG tablet; Take 6 tabs PO on day 1&2, 5 tabs PO on day 3&4, 4 tabs PO on day 5&6, 3 tabs PO on day 7&8, 2 tabs PO on day 9&10, 1 tab PO on day 11&12.  Dispense: 42 tablet; Refill: 0  XRAY Results: CLINICAL DATA:  Persistent productive cough.  EXAM: CHEST  2 VIEW  COMPARISON:  10/08/2016  FINDINGS: Previously seen central line has been removed. Left chest remains clear. There is mild persistent infiltrate in the right middle lobe, considerably improved since the previous study. The may be a tiny amount of pleural fluid in the posterior costophrenic angle. No acute bone finding. Heart size is normal. Aortic atherosclerosis again noted.  IMPRESSION: Marked radiographic improvement, but with some residual patchy density  in the right middle lobe. Continue to follow till clearing.   Electronically Signed   By: Nelson Chimes M.D.   On: 10/19/2016 16:50  Addend: Due to some residual density and possible pleural fluid in posterior costophrenic angle where she was having pain, will add Ceftin and f/u in 2 weeks as previously documented.       Mar Daring, PA-C  [facility]

## 2016-10-19 NOTE — Patient Instructions (Signed)
Pleurisy Pleurisy, also called pleuritis, is irritation and swelling (inflammation) of the linings of the lungs. The linings of the lungs are called pleura. They cover the outside of the lungs and the inside of the chest wall. There is a small amount of fluid (pleural fluid) between the pleura that allows the lungs to move in and out smoothly when you breathe. Pleurisy causes the pleura to be rough and dry and to rub together when you breathe, which is painful. In some cases, pleurisy can cause pleural fluid to build up between the pleura (pleural effusion). What are the causes? Common causes of this condition include:  A lung infection caused by bacteria or a virus.  A blood clot that travels to the lung (pulmonary embolism).  Air leaking into the pleural space (pneumothorax).  Lung cancer or a lung tumor.  A chest injury.  Diseases that can cause lung inflammation. These include rheumatoid arthritis, lupus, sickle cell disease, inflammatory bowel disease, and pancreatitis.  Heart or chest surgery.  Lung damage from inhaling asbestos.  A lung reaction to certain medicines.  Sometimes the cause is unknown. What are the signs or symptoms? Chest pain is the main symptom of this condition. The pain is usually on one side. Chest pain may start suddenly and be sharp or stabbing. It may become a constant dull ache. You may also feel pain in your back or shoulder. The pain may get worse when you cough, take deep breaths, or make sudden movements. Other symptoms may include:  Shortness of breath.  Noisy breathing (wheezing).  Cough.  Chills.  Fever.  How is this diagnosed? This condition may be diagnosed based on:  Your medical history.  Your symptoms.  A physical exam. Your health care provider will listen to your breathing with a stethoscope to check for a rough, rubbing sound (friction rub). If you have pleural effusion, your breathing sounds may be muffled.  Tests, such  as: ? Blood tests to check for infections or diseases and to measure the oxygen in your blood. ? Imaging studies of your lungs. These may include a chest X-ray, ultrasound, MRI, or CT scan. ? A procedure to remove pleural fluid with a needle for testing (thoracentesis).  How is this treated? Treatment for this condition depends on the cause. Pleurisy that was caused by a virus usually clears up within 2 weeks. Treatment for pleurisy may include:  NSAIDs to help relieve pain and swelling.  Antibiotic medicines, if your condition was caused by a bacterial infection.  Prescription pain or cough medicine.  Medicines to dissolve a blood clot, if your condition was caused by pulmonary embolism.  Removal of pleural fluid or air.  Follow these instructions at home: Medicines  Take over-the-counter and prescription medicines only as told by your health care provider.  If you were prescribed an antibiotic, take it as told by your health care provider. Do not stop taking the antibiotic even if you start to feel better. Activity  Rest and return to your normal activities as told by your health care provider. Ask your health care provider what activities are safe for you.  Do not drive or use heavy machinery while taking prescription pain medicine. General instructions  Monitor your pleurisy for any changes.  Take deep breaths often, even if it is painful. This can help prevent lung infection (pneumonia) and collapse of lung tissue (atelectasis).  When lying down, lie on your painful side. This may reduce pain.  Do not smoke. If   you need help quitting, ask your health care provider.  Keep all follow-up visits as told by your health care provider. This is important. Contact a health care provider if:  You have pain that: ? Gets worse. ? Does not get better with medicine. ? Lasts for more than 1 week.  You have a fever or chills.  Your cough or shortness of breath is not improving  at home.  You cough up pus-like (purulent) secretions. Get help right away if:  Your lips, fingernails, or toenails darken or turn blue.  You cough up blood.  You have any of the following symptoms that get worse: ? Difficulty breathing. ? Shortness of breath. ? Wheezing.  You have pain that spreads into your neck, arms, or jaw.  You develop a rash.  You vomit.  You faint. Summary  Pleurisy is inflammation of the linings of the lungs (pleura).  Pleurisy causes pain that makes it difficult for you to breathe or cough.  Pleurisy is often caused by an underlying infection or disease.  Treatment of pleurisy depends on the cause, and it often includes medicines. This information is not intended to replace advice given to you by your health care provider. Make sure you discuss any questions you have with your health care provider. Document Released: 11/02/2005 Document Revised: 07/27/2016 Document Reviewed: 07/27/2016 Elsevier Interactive Patient Education  2017 Elsevier Inc.  

## 2016-10-19 NOTE — Addendum Note (Signed)
Addended by: Mar Daring on: 10/19/2016 05:11 PM   Modules accepted: Orders

## 2016-10-20 LAB — CBC WITH DIFFERENTIAL/PLATELET
BASOS ABS: 0 10*3/uL (ref 0.0–0.2)
BASOS: 0 %
EOS (ABSOLUTE): 0.2 10*3/uL (ref 0.0–0.4)
Eos: 1 %
HEMOGLOBIN: 13 g/dL (ref 11.1–15.9)
Hematocrit: 39.9 % (ref 34.0–46.6)
IMMATURE GRANS (ABS): 0.3 10*3/uL — AB (ref 0.0–0.1)
Immature Granulocytes: 1 %
LYMPHS ABS: 2.8 10*3/uL (ref 0.7–3.1)
LYMPHS: 13 %
MCH: 30.8 pg (ref 26.6–33.0)
MCHC: 32.6 g/dL (ref 31.5–35.7)
MCV: 95 fL (ref 79–97)
Monocytes Absolute: 2.1 10*3/uL — ABNORMAL HIGH (ref 0.1–0.9)
Monocytes: 10 %
NEUTROS ABS: 16.3 10*3/uL — AB (ref 1.4–7.0)
Neutrophils: 75 %
PLATELETS: 275 10*3/uL (ref 150–379)
RBC: 4.22 x10E6/uL (ref 3.77–5.28)
RDW: 12.9 % (ref 12.3–15.4)
WBC: 21.8 10*3/uL (ref 3.4–10.8)

## 2016-10-20 LAB — COMPREHENSIVE METABOLIC PANEL
A/G RATIO: 1.5 (ref 1.2–2.2)
ALK PHOS: 114 IU/L (ref 39–117)
ALT: 65 IU/L — ABNORMAL HIGH (ref 0–32)
AST: 38 IU/L (ref 0–40)
Albumin: 4 g/dL (ref 3.6–4.8)
BILIRUBIN TOTAL: 1.2 mg/dL (ref 0.0–1.2)
BUN / CREAT RATIO: 10 — AB (ref 12–28)
BUN: 14 mg/dL (ref 8–27)
CHLORIDE: 89 mmol/L — AB (ref 96–106)
CO2: 21 mmol/L (ref 18–29)
Calcium: 9.4 mg/dL (ref 8.7–10.3)
Creatinine, Ser: 1.34 mg/dL — ABNORMAL HIGH (ref 0.57–1.00)
GFR calc non Af Amer: 41 mL/min/{1.73_m2} — ABNORMAL LOW (ref >59)
GFR, EST AFRICAN AMERICAN: 47 mL/min/{1.73_m2} — AB (ref >59)
GLUCOSE: 118 mg/dL — AB (ref 65–99)
Globulin, Total: 2.7 g/dL (ref 1.5–4.5)
POTASSIUM: 3.9 mmol/L (ref 3.5–5.2)
Sodium: 130 mmol/L — ABNORMAL LOW (ref 134–144)
TOTAL PROTEIN: 6.7 g/dL (ref 6.0–8.5)

## 2016-10-20 LAB — MAGNESIUM: MAGNESIUM: 1.5 mg/dL — AB (ref 1.6–2.3)

## 2016-10-20 NOTE — Addendum Note (Signed)
Addended by: Margaretann Loveless on: 10/20/2016 10:08 AM   Modules accepted: Orders

## 2016-10-24 LAB — CBC WITH DIFFERENTIAL/PLATELET
Basophils Absolute: 0 10*3/uL (ref 0.0–0.2)
Basos: 0 %
EOS (ABSOLUTE): 0.1 10*3/uL (ref 0.0–0.4)
EOS: 1 %
HEMATOCRIT: 37.1 % (ref 34.0–46.6)
HEMOGLOBIN: 12.1 g/dL (ref 11.1–15.9)
Immature Grans (Abs): 0.1 10*3/uL (ref 0.0–0.1)
Immature Granulocytes: 1 %
LYMPHS ABS: 1.2 10*3/uL (ref 0.7–3.1)
Lymphs: 10 %
MCH: 30 pg (ref 26.6–33.0)
MCHC: 32.6 g/dL (ref 31.5–35.7)
MCV: 92 fL (ref 79–97)
MONOCYTES: 11 %
Monocytes Absolute: 1.3 10*3/uL — ABNORMAL HIGH (ref 0.1–0.9)
NEUTROS ABS: 9.6 10*3/uL — AB (ref 1.4–7.0)
Neutrophils: 77 %
Platelets: 361 10*3/uL (ref 150–379)
RBC: 4.03 x10E6/uL (ref 3.77–5.28)
RDW: 13 % (ref 12.3–15.4)
WBC: 12.3 10*3/uL — ABNORMAL HIGH (ref 3.4–10.8)

## 2016-10-24 LAB — RENAL FUNCTION PANEL
ALBUMIN: 4.2 g/dL (ref 3.6–4.8)
BUN/Creatinine Ratio: 13 (ref 12–28)
BUN: 13 mg/dL (ref 8–27)
CHLORIDE: 85 mmol/L — AB (ref 96–106)
CO2: 24 mmol/L (ref 18–29)
Calcium: 9.4 mg/dL (ref 8.7–10.3)
Creatinine, Ser: 0.97 mg/dL (ref 0.57–1.00)
GFR, EST AFRICAN AMERICAN: 69 mL/min/{1.73_m2} (ref >59)
GFR, EST NON AFRICAN AMERICAN: 60 mL/min/{1.73_m2} (ref >59)
Glucose: 181 mg/dL — ABNORMAL HIGH (ref 65–99)
Phosphorus: 1.7 mg/dL — ABNORMAL LOW (ref 2.5–4.5)
Potassium: 4.4 mmol/L (ref 3.5–5.2)
Sodium: 126 mmol/L — ABNORMAL LOW (ref 134–144)

## 2016-10-26 ENCOUNTER — Telehealth: Payer: Self-pay

## 2016-10-26 NOTE — Telephone Encounter (Signed)
-----   Message from Mar Daring, PA-C sent at 10/26/2016  8:34 AM EST ----- WBC is improving. See how patient is feeling please. Thanks.

## 2016-10-26 NOTE — Telephone Encounter (Signed)
Ok thanks 

## 2016-10-26 NOTE — Telephone Encounter (Signed)
Pt advised. States she feels, "a little better and stronger". Pt is seeing pulmonologist tomorrow. Allene Dillon, CMA

## 2016-10-30 ENCOUNTER — Other Ambulatory Visit: Payer: Self-pay | Admitting: Family Medicine

## 2016-10-30 DIAGNOSIS — E876 Hypokalemia: Secondary | ICD-10-CM

## 2016-10-30 NOTE — Telephone Encounter (Signed)
Please review-aa 

## 2016-11-02 ENCOUNTER — Encounter: Payer: Self-pay | Admitting: Physician Assistant

## 2016-11-02 ENCOUNTER — Ambulatory Visit
Admission: RE | Admit: 2016-11-02 | Discharge: 2016-11-02 | Disposition: A | Discharge status: Home / Self Care | Payer: Medicare Other | Source: Ambulatory Visit | Attending: Physician Assistant | Admitting: Physician Assistant

## 2016-11-02 ENCOUNTER — Ambulatory Visit (INDEPENDENT_AMBULATORY_CARE_PROVIDER_SITE_OTHER): Payer: Medicare Other | Admitting: Physician Assistant

## 2016-11-02 ENCOUNTER — Telehealth: Payer: Self-pay

## 2016-11-02 VITALS — BP 148/72 | HR 92 | Temp 98.1°F | Resp 16 | Wt 149.0 lb

## 2016-11-02 DIAGNOSIS — I700 Atherosclerosis of aorta: Secondary | ICD-10-CM | POA: Insufficient documentation

## 2016-11-02 DIAGNOSIS — J181 Lobar pneumonia, unspecified organism: Secondary | ICD-10-CM | POA: Diagnosis present

## 2016-11-02 DIAGNOSIS — J189 Pneumonia, unspecified organism: Secondary | ICD-10-CM

## 2016-11-02 DIAGNOSIS — D72829 Elevated white blood cell count, unspecified: Secondary | ICD-10-CM | POA: Diagnosis not present

## 2016-11-02 MED ORDER — CEFUROXIME AXETIL 250 MG PO TABS: 250.0000 mg | ORAL_TABLET | Freq: Two times a day (BID) | ORAL | 0 refills | Status: DC

## 2016-11-02 NOTE — Patient Instructions (Signed)
Community-Acquired Pneumonia, Adult °Introduction °Pneumonia is an infection of the lungs. One type of pneumonia can happen while a person is in a hospital. A different type can happen when a person is not in a hospital (community-acquired pneumonia). It is easy for this kind to spread from person to person. It can spread to you if you breathe near an infected person who coughs or sneezes. Some symptoms include: °· A dry cough. °· A wet (productive) cough. °· Fever. °· Sweating. °· Chest pain. °Follow these instructions at home: °· Take over-the-counter and prescription medicines only as told by your doctor. °¨ Only take cough medicine if you are losing sleep. °¨ If you were prescribed an antibiotic medicine, take it as told by your doctor. Do not stop taking the antibiotic even if you start to feel better. °· Sleep with your head and neck raised (elevated). You can do this by putting a few pillows under your head, or you can sleep in a recliner. °· Do not use tobacco products. These include cigarettes, chewing tobacco, and e-cigarettes. If you need help quitting, ask your doctor. °· Drink enough water to keep your pee (urine) clear or pale yellow. °A shot (vaccine) can help prevent pneumonia. Shots are often suggested for: °· People older than 68 years of age. °· People older than 68 years of age: °¨ Who are having cancer treatment. °¨ Who have long-term (chronic) lung disease. °¨ Who have problems with their body's defense system (immune system). °You may also prevent pneumonia if you take these actions: °· Get the flu (influenza) shot every year. °· Go to the dentist as often as told. °· Wash your hands often. If soap and water are not available, use hand sanitizer. °Contact a doctor if: °· You have a fever. °· You lose sleep because your cough medicine does not help. °Get help right away if: °· You are short of breath and it gets worse. °· You have more chest pain. °· Your sickness gets worse. This is very  serious if: °¨ You are an older adult. °¨ Your body's defense system is weak. °· You cough up blood. °This information is not intended to replace advice given to you by your health care provider. Make sure you discuss any questions you have with your health care provider. °Document Released: 04/20/2008 Document Revised: 04/09/2016 Document Reviewed: 02/27/2015 °© 2017 Elsevier ° °

## 2016-11-02 NOTE — Telephone Encounter (Signed)
Patient advised as below. Patient verbalizes understanding and is in agreement with treatment plan.  

## 2016-11-02 NOTE — Telephone Encounter (Signed)
LMTCB

## 2016-11-02 NOTE — Telephone Encounter (Signed)
-----   Message from Margaretann Loveless, [place] sent at 11/02/2016  1:58 PM EST ----- Xray is clear of pneumonia or effusion now. There is some chronic bronchitis changes noted which we know you have already. Ok to complete antibiotic, continue inhalers. No f/u needed at this time unless symptoms worsen.

## 2016-11-02 NOTE — Progress Notes (Signed)
Patient: Lori Brady Female    DOB: Mar 27, 1946   70 y.o.   MRN: [id number] Visit Date: 11/02/2016  Today's Provider: Margaretann Loveless, PA-C   Chief Complaint  Patient presents with  . Pneumonia    FU  . Hypertension   Subjective:    HPI Patient is here to follow-up on pneumonia and Blood Pressure.  Blood Pressure: patient's blood pressure was low 2 weeks ago.Patient was advised to take her Lisinopril 20MG  only once daily and continue Chlorthalidone 25MG  once daily. Pt's BP is elevated at 148/72 today in office. Pt did take 2 nebulizer treatments prior to OV. Pt reports her outside BP reads about 120/60-70. She also did not take her chlorthalidone this morning and had the nebulizer treatments before coming in.  Pneumonia: A chest X-ray was ordered to r/o recurrent pneumonia vs Pleurisy. X-ray showed an effusion or recurrent pneumonia. Antibiotic was sent to her pharmacy. Pt still c/o productive cough, which gets stuck in throat and chokes pt. Pt also c/o sore throat, PND, wheezing. Pt denies fever, chest pain, SOB. She does report that she feels about 50% better, but still not there and starting to feel worse again. This just started with feeling bad again on Saturday and Sunday.     Allergies  Allergen Reactions  . Levaquin [Levofloxacin] Nausea Only  . Vicodin [Hydrocodone-Acetaminophen]     nausea     Current Outpatient Prescriptions:  .  albuterol (ACCUNEB) 0.63 MG/3ML nebulizer solution, Take 1 ampule by nebulization every 6 (six) hours as needed for wheezing., Disp: , Rfl:  .  alendronate (FOSAMAX) 70 MG tablet, Take 1 tablet (70 mg total) by mouth once a week., Disp: 12 tablet, Rfl: 4 .  aspirin 81 MG tablet, Take 81 mg by mouth daily., Disp: , Rfl:  .  betamethasone valerate (VALISONE) 0.1 % cream, APPLY TOPICALLY TO AFFECTED AREA SPARINGLY 2 TIMES DAILY., Disp: 15 g, Rfl: 5 .  Cholecalciferol (VITAMIN D) 2000 UNITS CAPS, Take 1 capsule by mouth daily., Disp:  , Rfl:  .  citalopram (CELEXA) 20 MG tablet, TAKE 1 TABLET BY MOUTH ONCE DAILY., Disp: 90 tablet, Rfl: 1 .  doxepin (SINEQUAN) 25 MG capsule, Take 25 mg by mouth at bedtime. , Disp: , Rfl:  .  fluticasone (FLONASE) 50 MCG/ACT nasal spray, USE 2 SPRAYS IN EACH NOSTRIL ONCE DAILY., Disp: 16 g, Rfl: 11 .  Fluticasone Furoate-Vilanterol (BREO ELLIPTA) 100-25 MCG/INH AEPB, Inhale 1 puff into the lungs daily. , Disp: , Rfl:  .  lisinopril (PRINIVIL,ZESTRIL) 20 MG tablet, Take 20 mg by mouth daily. , Disp: , Rfl:  .  LORazepam (ATIVAN) 0.5 MG tablet, Take 1 tablet (0.5 mg total) by mouth every 8 (eight) hours as needed. for anxiety, Disp: 90 tablet, Rfl: 5 .  magnesium oxide (MAG-OX) 400 (241.3 Mg) MG tablet, Take 1 tablet (400 mg total) by mouth daily., Disp: 30 tablet, Rfl: 0 .  montelukast (SINGULAIR) 10 MG tablet, Take 1 tablet (10 mg total) by mouth daily., Disp: 90 tablet, Rfl: 1 .  Omega-3 Fatty Acids (FISH OIL) 1000 MG CAPS, Take 1 capsule by mouth daily., Disp: , Rfl:  .  omeprazole (PRILOSEC) 40 MG capsule, Reported on 05/28/2016, Disp: , Rfl:  .  potassium chloride SA (K-DUR,KLOR-CON) 20 MEQ tablet, TAKE 1 TABLET BY MOUTH TWICE DAILY, Disp: 60 tablet, Rfl: 1 .  PROVENTIL HFA 108 (90 Base) MCG/ACT inhaler, Inhale 1-2 puffs into the lungs every 4 (four)  hours as needed. , Disp: , Rfl:  .  simvastatin (ZOCOR) 40 MG tablet, TAKE 1 TABLET BY MOUTH AT BEDTIME., Disp: 90 tablet, Rfl: 3 .  tiotropium (SPIRIVA) 18 MCG inhalation capsule, Place 1 capsule (18 mcg total) into inhaler and inhale daily., Disp: 90 capsule, Rfl: 1 .  triamcinolone cream (KENALOG) 0.1 %, Apply 1 application topically daily. , Disp: , Rfl:  .  chlorthalidone (HYGROTON) 25 MG tablet, Take 1 tablet (25 mg total) by mouth daily. (Patient not taking: Reported on 11/02/2016), Disp: 30 tablet, Rfl: 0  Review of Systems  Constitutional: Positive for activity change and fatigue. Negative for appetite change, chills, diaphoresis, fever  and unexpected weight change.  HENT: Positive for postnasal drip and sore throat. Negative for ear pain, rhinorrhea, sinus pain, sinus pressure, sneezing, tinnitus and trouble swallowing.   Respiratory: Positive for cough, choking and wheezing. Negative for chest tightness and shortness of breath.   Cardiovascular: Negative for chest pain, palpitations and leg swelling.  Gastrointestinal: Negative for abdominal pain.  Neurological: Negative for dizziness and headaches.    Social History  Substance Use Topics  . Smoking status: Former Smoker    Packs/day: 1.00    Years: 30.00    Types: Cigarettes    Quit date: 08/01/2008  . Smokeless tobacco: Never Used  . Alcohol use Yes     Comment: occasional   Objective:   BP (!) 148/72 (BP Location: Left Arm, Patient Position: Sitting, Cuff Size: Normal) Comment: Pt had 2 neb tx this am  Pulse 92   Temp 98.1 F (36.7 C) (Oral)   Resp 16   Wt 149 lb (67.6 kg)   SpO2 96%   BMI 26.39 kg/m   Physical Exam  Constitutional: She appears well-developed and well-nourished. No distress.  HENT:  Head: Normocephalic and atraumatic.  Right Ear: Hearing, tympanic membrane, external ear and ear canal normal.  Left Ear: Hearing, tympanic membrane, external ear and ear canal normal.  Nose: Nose normal.  Mouth/Throat: Uvula is midline, oropharynx is clear and moist and mucous membranes are normal. No oropharyngeal exudate.  Eyes: Conjunctivae are normal. Pupils are equal, round, and reactive to light. Right eye exhibits no discharge. Left eye exhibits no discharge. No scleral icterus.  Neck: Normal range of motion. Neck supple. No tracheal deviation present. No thyromegaly present.  Cardiovascular: Normal rate, regular rhythm and normal heart sounds.  Exam reveals no gallop and no friction rub.   No murmur heard. Pulmonary/Chest: Effort normal. No stridor. No respiratory distress. She has no decreased breath sounds. She has wheezes (faintly heard in left  and right upper lobes). She has no rhonchi. She has no rales.  Lymphadenopathy:    She has no cervical adenopathy.  Skin: Skin is warm and dry. She is not diaphoretic.  Vitals reviewed.      Assessment & Plan:     1. Pneumonia of right middle lobe due to infectious organism [facility]) Will recheck xray and CBC as below. Have reordered Ceftin as below and will treat for 15 days. I will f/u with her pending results of the testing. Will schedule f/u pending results. She is to call if symptoms worsen. - DG Chest 2 View; Future - cefUROXime (CEFTIN) 250 MG tablet; Take 1 tablet (250 mg total) by mouth 2 (two) times daily with a meal.  Dispense: 30 tablet; Refill: 0  2. Leukocytosis, unspecified type See above medical treatment plan. - CBC with Differential     Patient seen and examined  by Margaretann LovelessJennifer M Delio Slates, PA-C, and note scribed by Allene DillonEmily Drozdowski, CMA.  Margaretann LovelessJennifer M Lucrecia Mcphearson, PA-C  [facility]

## 2016-11-03 ENCOUNTER — Telehealth: Payer: Self-pay

## 2016-11-03 LAB — CBC WITH DIFFERENTIAL/PLATELET
BASOS ABS: 0 10*3/uL (ref 0.0–0.2)
Basos: 0 %
EOS (ABSOLUTE): 0.2 10*3/uL (ref 0.0–0.4)
Eos: 1 %
HEMOGLOBIN: 12.4 g/dL (ref 11.1–15.9)
Hematocrit: 37.7 % (ref 34.0–46.6)
IMMATURE GRANULOCYTES: 4 %
Immature Grans (Abs): 0.5 10*3/uL — ABNORMAL HIGH (ref 0.0–0.1)
LYMPHS ABS: 1.4 10*3/uL (ref 0.7–3.1)
Lymphs: 11 %
MCH: 30.2 pg (ref 26.6–33.0)
MCHC: 32.9 g/dL (ref 31.5–35.7)
MCV: 92 fL (ref 79–97)
MONOCYTES: 5 %
MONOS ABS: 0.6 10*3/uL (ref 0.1–0.9)
NEUTROS PCT: 79 %
Neutrophils Absolute: 10.5 10*3/uL — ABNORMAL HIGH (ref 1.4–7.0)
Platelets: 147 10*3/uL — ABNORMAL LOW (ref 150–379)
RBC: 4.11 x10E6/uL (ref 3.77–5.28)
RDW: 13.5 % (ref 12.3–15.4)
WBC: 12.8 10*3/uL — AB (ref 3.4–10.8)

## 2016-11-03 NOTE — Telephone Encounter (Signed)
-----   Message from Mar Daring, PA-C sent at 11/03/2016  8:26 AM EST ----- WBC stable at 12. Continue antibiotic until completed. Once completed please call and we will recheck CBC

## 2016-11-03 NOTE — Telephone Encounter (Signed)
Advised pt of lab results. Pt verbally acknowledges understanding. Emily Drozdowski, CMA   

## 2016-11-12 ENCOUNTER — Other Ambulatory Visit
Admission: RE | Admit: 2016-11-12 | Discharge: 2016-11-12 | Disposition: A | Discharge status: Home / Self Care | Payer: Medicare Other | Source: Ambulatory Visit | Attending: Physician Assistant | Admitting: Physician Assistant

## 2016-11-12 ENCOUNTER — Ambulatory Visit
Admission: RE | Admit: 2016-11-12 | Discharge: 2016-11-12 | Disposition: A | Discharge status: Home / Self Care | Payer: Medicare Other | Source: Ambulatory Visit | Attending: Physician Assistant | Admitting: Physician Assistant

## 2016-11-12 ENCOUNTER — Encounter: Payer: Self-pay | Admitting: Physician Assistant

## 2016-11-12 ENCOUNTER — Ambulatory Visit (INDEPENDENT_AMBULATORY_CARE_PROVIDER_SITE_OTHER): Payer: Medicare Other | Admitting: Physician Assistant

## 2016-11-12 VITALS — BP 142/78 | HR 95 | Temp 99.1°F | Resp 18 | Wt 151.8 lb

## 2016-11-12 DIAGNOSIS — Z9289 Personal history of other medical treatment: Secondary | ICD-10-CM

## 2016-11-12 DIAGNOSIS — Z8701 Personal history of pneumonia (recurrent): Secondary | ICD-10-CM | POA: Insufficient documentation

## 2016-11-12 DIAGNOSIS — R509 Fever, unspecified: Secondary | ICD-10-CM

## 2016-11-12 DIAGNOSIS — B370 Candidal stomatitis: Secondary | ICD-10-CM

## 2016-11-12 DIAGNOSIS — I700 Atherosclerosis of aorta: Secondary | ICD-10-CM | POA: Diagnosis not present

## 2016-11-12 DIAGNOSIS — R110 Nausea: Secondary | ICD-10-CM

## 2016-11-12 DIAGNOSIS — J449 Chronic obstructive pulmonary disease, unspecified: Secondary | ICD-10-CM

## 2016-11-12 LAB — CBC WITH DIFFERENTIAL/PLATELET
Basophils Absolute: 0.1 10*3/uL (ref 0–0.1)
Basophils Relative: 1 %
Eosinophils Absolute: 0.2 10*3/uL (ref 0–0.7)
Eosinophils Relative: 2 %
HCT: 37.9 % (ref 35.0–47.0)
Hemoglobin: 12.8 g/dL (ref 12.0–16.0)
Lymphocytes Relative: 20 %
Lymphs Abs: 2 10*3/uL (ref 1.0–3.6)
MCH: 31.1 pg (ref 26.0–34.0)
MCHC: 33.8 g/dL (ref 32.0–36.0)
MCV: 91.8 fL (ref 80.0–100.0)
Monocytes Absolute: 0.7 10*3/uL (ref 0.2–0.9)
Monocytes Relative: 7 %
Neutro Abs: 7 10*3/uL — ABNORMAL HIGH (ref 1.4–6.5)
Neutrophils Relative %: 70 %
Platelets: 164 10*3/uL (ref 150–440)
RBC: 4.13 MIL/uL (ref 3.80–5.20)
RDW: 13.2 % (ref 11.5–14.5)
WBC: 9.8 10*3/uL (ref 3.6–11.0)

## 2016-11-12 LAB — COMPREHENSIVE METABOLIC PANEL
ALT: 57 U/L — ABNORMAL HIGH (ref 14–54)
AST: 49 U/L — ABNORMAL HIGH (ref 15–41)
Albumin: 4.2 g/dL (ref 3.5–5.0)
Alkaline Phosphatase: 80 U/L (ref 38–126)
Anion gap: 10 (ref 5–15)
BUN: 10 mg/dL (ref 6–20)
CO2: 27 mmol/L (ref 22–32)
Calcium: 9.1 mg/dL (ref 8.9–10.3)
Chloride: 99 mmol/L — ABNORMAL LOW (ref 101–111)
Creatinine, Ser: 0.62 mg/dL (ref 0.44–1.00)
GFR calc Af Amer: >60 mL/min (ref >60)
GFR calc non Af Amer: >60 mL/min (ref >60)
Glucose, Bld: 97 mg/dL (ref 65–99)
Potassium: 3.8 mmol/L (ref 3.5–5.1)
Sodium: 136 mmol/L (ref 135–145)
Total Bilirubin: 1.9 mg/dL — ABNORMAL HIGH (ref 0.3–1.2)
Total Protein: 7.4 g/dL (ref 6.5–8.1)

## 2016-11-12 LAB — POCT URINALYSIS DIPSTICK
Bilirubin, UA: NEGATIVE
Blood, UA: NEGATIVE
Glucose, UA: NEGATIVE
Ketones, UA: NEGATIVE
Leukocytes, UA: NEGATIVE
Nitrite, UA: NEGATIVE
Protein, UA: NEGATIVE
Spec Grav, UA: 1.015
Urobilinogen, UA: 0.2
pH, UA: 6

## 2016-11-12 LAB — POCT INFLUENZA A/B
Influenza A, POC: NEGATIVE
Influenza B, POC: NEGATIVE

## 2016-11-12 MED ORDER — LEVOFLOXACIN 750 MG PO TABS: 750.0000 mg | ORAL_TABLET | Freq: Every day | ORAL | 0 refills | Status: DC

## 2016-11-12 MED ORDER — MAGIC MOUTHWASH: 5.0000 mL | Freq: Three times a day (TID) | ORAL | 0 refills | Status: DC

## 2016-11-12 MED ORDER — PROMETHAZINE HCL 12.5 MG PO TABS: 12.5000 mg | ORAL_TABLET | Freq: Four times a day (QID) | ORAL | 0 refills | Status: DC | PRN

## 2016-11-12 NOTE — Progress Notes (Signed)
Patient: Lori Brady Female    DOB: [birth year]   70 y.o.   MRN: [id number] Visit Date: 11/13/2016  Today's Provider: Trey Sailors, PA-C   Chief Complaint  Patient presents with  . Pneumonia  . Follow-up   Subjective:    HPI Patient is a 70 y/o female with history of COPD who was hospitalized on 10/08/2016 due to septic shock and right lower lobar pneumonia. Cultures were negative. She was started on pressors, zosyn, and zithromax. She recovered and at discharge she was transitioned to Ceftin and zithromax.   She presented to this clinic on 11/02/2016 with some improvement but not complete, and was extended on her Cefuroxime.   Today she is presenting because she feels her antibiotics have stopped working and she feels worse. She does feel lethargic, has a continued cough. She still complains of fever mostly at night, headache, fatigue and chills. Her fever yesterday was 100.1. Denies nausea. Does have some non bloody diarrhea. Patient also complains of SOB.  She is also reporting a white coating covering her tongue that she thinks is thrush.       Previous Medications   ALBUTEROL (ACCUNEB) 0.63 MG/3ML NEBULIZER SOLUTION    Take 1 ampule by nebulization every 6 (six) hours as needed for wheezing.   ALENDRONATE (FOSAMAX) 70 MG TABLET    Take 1 tablet (70 mg total) by mouth once a week.   ASPIRIN 81 MG TABLET    Take 81 mg by mouth daily.   BETAMETHASONE VALERATE (VALISONE) 0.1 % CREAM    APPLY TOPICALLY TO AFFECTED AREA SPARINGLY 2 TIMES DAILY.   CHLORTHALIDONE (HYGROTON) 25 MG TABLET    Take 1 tablet (25 mg total) by mouth daily.   CHOLECALCIFEROL (VITAMIN D) 2000 UNITS CAPS    Take 1 capsule by mouth daily.   CITALOPRAM (CELEXA) 20 MG TABLET    TAKE 1 TABLET BY MOUTH ONCE DAILY.   DOXEPIN (SINEQUAN) 25 MG CAPSULE    Take 25 mg by mouth at bedtime.    FLUTICASONE (FLONASE) 50 MCG/ACT NASAL SPRAY    USE 2 SPRAYS IN EACH NOSTRIL ONCE DAILY.   FLUTICASONE FUROATE-VILANTEROL (BREO  ELLIPTA) 100-25 MCG/INH AEPB    Inhale 1 puff into the lungs daily.    LISINOPRIL (PRINIVIL,ZESTRIL) 20 MG TABLET    Take 20 mg by mouth daily.    LORAZEPAM (ATIVAN) 0.5 MG TABLET    Take 1 tablet (0.5 mg total) by mouth every 8 (eight) hours as needed. for anxiety   MAGNESIUM OXIDE (MAG-OX) 400 (241.3 MG) MG TABLET    Take 1 tablet (400 mg total) by mouth daily.   MONTELUKAST (SINGULAIR) 10 MG TABLET    Take 1 tablet (10 mg total) by mouth daily.   OMEGA-3 FATTY ACIDS (FISH OIL) 1000 MG CAPS    Take 1 capsule by mouth daily.   OMEPRAZOLE (PRILOSEC) 40 MG CAPSULE    Reported on 05/28/2016   POTASSIUM CHLORIDE SA (K-DUR,KLOR-CON) 20 MEQ TABLET    TAKE 1 TABLET BY MOUTH TWICE DAILY   PROVENTIL HFA 108 (90 BASE) MCG/ACT INHALER    Inhale 1-2 puffs into the lungs every 4 (four) hours as needed.    SIMVASTATIN (ZOCOR) 40 MG TABLET    TAKE 1 TABLET BY MOUTH AT BEDTIME.   TIOTROPIUM (SPIRIVA) 18 MCG INHALATION CAPSULE    Place 1 capsule (18 mcg total) into inhaler and inhale daily.   TRIAMCINOLONE CREAM (KENALOG) 0.1 %    Apply 1  application topically daily.     Review of Systems  Constitutional: Positive for chills, fatigue and fever.  Eyes: Negative.   Respiratory: Positive for cough and shortness of breath.   Cardiovascular: Negative.   Neurological: Positive for headaches.    Social History  Substance Use Topics  . Smoking status: Former Smoker    Packs/day: 1.00    Years: 30.00    Types: Cigarettes    Quit date: 08/01/2008  . Smokeless tobacco: Never Used  . Alcohol use Yes     Comment: occasional   Objective:   BP (!) 142/78 (BP Location: Right Arm, Patient Position: Sitting, Cuff Size: Normal)   Pulse 95   Temp 99.1 F (37.3 C) (Oral)   Resp 18   Wt 151 lb 12.8 oz (68.9 kg)   SpO2 98%   BMI 26.89 kg/m   Physical Exam  Constitutional: She is oriented to person, place, and time. She appears well-developed and well-nourished.  Non-toxic appearance. She does not have a sickly  appearance. She appears ill. No distress.  HENT:  Right Ear: External ear normal.  Left Ear: External ear normal.  Mouth/Throat: Oropharynx is clear and moist. No oropharyngeal exudate.  White coating on surface of tongue that is scrapable.   Neck: Neck supple.  Cardiovascular: Normal rate and regular rhythm.   Pulmonary/Chest: Effort normal and breath sounds normal. No respiratory distress. She has no wheezes. She has no rales.  Abdominal: Soft. Bowel sounds are normal. She exhibits no distension. There is no tenderness.  Lymphadenopathy:    She has no cervical adenopathy.  Neurological: She is alert and oriented to person, place, and time.  Skin: Skin is warm and dry.  Psychiatric: She has a normal mood and affect. Her behavior is normal.        Assessment & Plan:     1. COPD, severe (HCC)  Patient is oxygenating well at 98% and does not seem to be in respiratory distress.   2. Hospitalization or health care facility admission within last 6 months  Patient is nontoxic looking in office, oxygenating well. Temp is 99.28F. Her lungs have no adverse sounds. Her CBC looks improved from 11 days ago, with total white count and absolute neutrophils down. CMEt with normal kidney function, slightly elevated liver enzymes. CXR is clear of pneumonia. I have personally viewed the image and agree with the radiology report. However, with patient's symptoms and her recent hospitalization, will treat as listed below with Levaquin. Patient should discontinue Ceftin and hold her doxepin and Celexa while taking the Levaquin. She will follow up in clinic on Tuesday. I have discussed this patient with Dr. Sullivan Lone. Patient has been instructed that if she feels worse over the weekend, she needs to be seen in the emergency room.  - Comprehensive metabolic panel - CBC with Differential/Platelet - DG Chest 2 View; Future  3. Fever, unspecified fever cause  In office urinalysis negative. In office flu  negative. See above for plan.  - Comprehensive metabolic panel - CBC with Differential/Platelet - DG Chest 2 View; Future - levofloxacin (LEVAQUIN) 750 MG tablet; Take 1 tablet (750 mg total) by mouth daily.  Dispense: 5 tablet; Refill: 0 - POCT Urinalysis Dipstick - POCT Influenza A/B  4. History of pneumonia  - DG Chest 2 View; Future  5. Oral candidiasis  - magic mouthwash SOLN; Take 5 mLs by mouth 3 (three) times daily.  Dispense: 120 mL; Refill: 0  6. Nausea  - promethazine (PHENERGAN) 12.5  MG tablet; Take 1 tablet (12.5 mg total) by mouth every 6 (six) hours as needed for nausea or vomiting.  Dispense: 30 tablet; Refill: 0  The entirety of the information documented in the History of Present Illness, Review of Systems and Physical Exam were personally obtained by me. Portions of this information were initially documented by [place] and reviewed by me for thoroughness and accuracy.      Patient Instructions  Take Levaquin 750 mg once daily for five days. While you are taking this, please stop Celexa and doxepin.   [facility]   Follow up on Tuesday.     I have spent 25 minutes with this patient, >50% of which was spent on counseling and coordination of care.  Follow up: Return in about 5 days (around 11/17/2016) for pneumonia, fever.

## 2016-11-12 NOTE — Patient Instructions (Signed)
Take Levaquin 750 mg once daily for five days. While you are taking this, please stop Celexa and doxepin.   Get CXR at [facility]. [facility] labs [facility].   Follow up on Tuesday.

## 2016-11-13 ENCOUNTER — Telehealth: Payer: Self-pay

## 2016-11-13 NOTE — Telephone Encounter (Signed)
-----   Message from Trey Sailors, [place] sent at 11/13/2016  8:51 AM EST ----- White count on blood work has actually been trending down since last taken 11 days ago, which is good. Metabolic panel looks okay too. CXR looks clear as well. However, because of patient's symptoms, it is still reasonable to do the Levaquin for the next five days, stop the Ceftin, and also hold her doexpin and Celexa while she is doing this. We will see her in clinic on Tuesday. If over the weekend she feels worse, she really does need to be seen in the emergency room.

## 2016-11-13 NOTE — Telephone Encounter (Signed)
Patient advised as below. Patient verbalizes understanding and is in agreement with treatment plan.  

## 2016-11-17 ENCOUNTER — Ambulatory Visit (INDEPENDENT_AMBULATORY_CARE_PROVIDER_SITE_OTHER): Payer: Medicare Other | Admitting: Physician Assistant

## 2016-11-17 ENCOUNTER — Encounter: Payer: Self-pay | Admitting: Physician Assistant

## 2016-11-17 VITALS — BP 144/76 | HR 80 | Temp 98.3°F | Resp 16 | Wt 151.0 lb

## 2016-11-17 DIAGNOSIS — F419 Anxiety disorder, unspecified: Secondary | ICD-10-CM

## 2016-11-17 DIAGNOSIS — Z8701 Personal history of pneumonia (recurrent): Secondary | ICD-10-CM

## 2016-11-17 MED ORDER — CITALOPRAM HYDROBROMIDE 20 MG PO TABS: 20.0000 mg | ORAL_TABLET | Freq: Every day | ORAL | 1 refills | Status: DC

## 2016-11-17 NOTE — Patient Instructions (Signed)
Community-Acquired Pneumonia, Adult °Introduction °Pneumonia is an infection of the lungs. One type of pneumonia can happen while a person is in a hospital. A different type can happen when a person is not in a hospital (community-acquired pneumonia). It is easy for this kind to spread from person to person. It can spread to you if you breathe near an infected person who coughs or sneezes. Some symptoms include: °· A dry cough. °· A wet (productive) cough. °· Fever. °· Sweating. °· Chest pain. °Follow these instructions at home: °· Take over-the-counter and prescription medicines only as told by your doctor. °¨ Only take cough medicine if you are losing sleep. °¨ If you were prescribed an antibiotic medicine, take it as told by your doctor. Do not stop taking the antibiotic even if you start to feel better. °· Sleep with your head and neck raised (elevated). You can do this by putting a few pillows under your head, or you can sleep in a recliner. °· Do not use tobacco products. These include cigarettes, chewing tobacco, and e-cigarettes. If you need help quitting, ask your doctor. °· Drink enough water to keep your pee (urine) clear or pale yellow. °A shot (vaccine) can help prevent pneumonia. Shots are often suggested for: °· People older than 69 years of age. °· People older than 69 years of age: °¨ Who are having cancer treatment. °¨ Who have long-term (chronic) lung disease. °¨ Who have problems with their body's defense system (immune system). °You may also prevent pneumonia if you take these actions: °· Get the flu (influenza) shot every year. °· Go to the dentist as often as told. °· Wash your hands often. If soap and water are not available, use hand sanitizer. °Contact a doctor if: °· You have a fever. °· You lose sleep because your cough medicine does not help. °Get help right away if: °· You are short of breath and it gets worse. °· You have more chest pain. °· Your sickness gets worse. This is very  serious if: °¨ You are an older adult. °¨ Your body's defense system is weak. °· You cough up blood. °This information is not intended to replace advice given to you by your health care provider. Make sure you discuss any questions you have with your health care provider. °Document Released: 04/20/2008 Document Revised: 04/09/2016 Document Reviewed: 02/27/2015 °© 2017 Elsevier ° °

## 2016-11-17 NOTE — Progress Notes (Signed)
Patient: Lori Brady Female    DOB: 28-Oct-1946   71 y.o.   MRN: [id number] Visit Date: 11/17/2016  Today's Provider: Trinna Post, PA-C   Chief Complaint  Patient presents with  . COPD    Follow up   Subjective:    Breathing Problem  She complains of cough, difficulty breathing and wheezing. There is no shortness of breath. The problem has been gradually improving. The cough is productive of sputum and productive. Associated symptoms include headaches, malaise/fatigue, nasal congestion, postnasal drip, rhinorrhea and sneezing. Pertinent negatives include no appetite change, ear pain, fever or sore throat. Her symptoms are alleviated by steroid inhaler. Her past medical history is significant for COPD and pneumonia.   Patient is here for follow up of pneumonia. She was seen in clinic 5 days ago not feeling well after an ICU admission on 10/08/2016 for septic shock 2/2 pneumonia. At that point she had been on extended course of Ceftin. Due to her symptoms, this was discontinued and changed to Levaquin 750 mg x 5 days. CBC showed no elevated white count, CMET within normal limits. CXR was clear of pneumonia. While taking Levaquin, she was not taking doxepin or Celexa due to interactions.  Today, patient is presenting saying she feels better. She does still feel weak and has a cough and some wheezing. She uses her inhaler as needed which helps her wheezing. She is not having SOB, fevers, nausea, vomiting. She wants to know if she can resume her exercise.   She does need a refill on her Celexa. She is not feeling down or depressed, or anxious. She tolerates Celexa well with no adverse side effects.    Allergies  Allergen Reactions  . Levaquin [Levofloxacin] Nausea Only  . Vicodin [Hydrocodone-Acetaminophen]     nausea     Current Outpatient Prescriptions:  .  albuterol (ACCUNEB) 0.63 MG/3ML nebulizer solution, Take 1 ampule by nebulization every 6 (six) hours as needed for  wheezing., Disp: , Rfl:  .  alendronate (FOSAMAX) 70 MG tablet, Take 1 tablet (70 mg total) by mouth once a week., Disp: 12 tablet, Rfl: 4 .  aspirin 81 MG tablet, Take 81 mg by mouth daily., Disp: , Rfl:  .  Cholecalciferol (VITAMIN D) 2000 UNITS CAPS, Take 1 capsule by mouth daily., Disp: , Rfl:  .  citalopram (CELEXA) 20 MG tablet, Take 1 tablet (20 mg total) by mouth daily., Disp: 90 tablet, Rfl: 1 .  doxepin (SINEQUAN) 25 MG capsule, Take 25 mg by mouth at bedtime. , Disp: , Rfl:  .  fluticasone (FLONASE) 50 MCG/ACT nasal spray, USE 2 SPRAYS IN EACH NOSTRIL ONCE DAILY., Disp: 16 g, Rfl: 11 .  Fluticasone Furoate-Vilanterol (BREO ELLIPTA) 100-25 MCG/INH AEPB, Inhale 1 puff into the lungs daily. , Disp: , Rfl:  .  lisinopril (PRINIVIL,ZESTRIL) 20 MG tablet, Take 20 mg by mouth daily. , Disp: , Rfl:  .  LORazepam (ATIVAN) 0.5 MG tablet, Take 1 tablet (0.5 mg total) by mouth every 8 (eight) hours as needed. for anxiety, Disp: 90 tablet, Rfl: 5 .  magic mouthwash SOLN, Take 5 mLs by mouth 3 (three) times daily., Disp: 120 mL, Rfl: 0 .  magnesium oxide (MAG-OX) 400 (241.3 Mg) MG tablet, Take 1 tablet (400 mg total) by mouth daily., Disp: 30 tablet, Rfl: 0 .  montelukast (SINGULAIR) 10 MG tablet, Take 1 tablet (10 mg total) by mouth daily., Disp: 90 tablet, Rfl: 1 .  Omega-3 Fatty  Acids (FISH OIL) 1000 MG CAPS, Take 1 capsule by mouth daily., Disp: , Rfl:  .  omeprazole (PRILOSEC) 40 MG capsule, Reported on 05/28/2016, Disp: , Rfl:  .  potassium chloride SA (K-DUR,KLOR-CON) 20 MEQ tablet, TAKE 1 TABLET BY MOUTH TWICE DAILY, Disp: 60 tablet, Rfl: 1 .  promethazine (PHENERGAN) 12.5 MG tablet, Take 1 tablet (12.5 mg total) by mouth every 6 (six) hours as needed for nausea or vomiting., Disp: 30 tablet, Rfl: 0 .  PROVENTIL HFA 108 (90 Base) MCG/ACT inhaler, Inhale 1-2 puffs into the lungs every 4 (four) hours as needed. , Disp: , Rfl:  .  simvastatin (ZOCOR) 40 MG tablet, TAKE 1 TABLET BY MOUTH AT  BEDTIME., Disp: 90 tablet, Rfl: 3 .  tiotropium (SPIRIVA) 18 MCG inhalation capsule, Place 1 capsule (18 mcg total) into inhaler and inhale daily., Disp: 90 capsule, Rfl: 1 .  triamcinolone cream (KENALOG) 0.1 %, Apply 1 application topically daily. , Disp: , Rfl:  .  betamethasone valerate (VALISONE) 0.1 % cream, APPLY TOPICALLY TO AFFECTED AREA SPARINGLY 2 TIMES DAILY., Disp: 15 g, Rfl: 5 .  chlorthalidone (HYGROTON) 25 MG tablet, Take 1 tablet (25 mg total) by mouth daily. (Patient not taking: Reported on 11/17/2016), Disp: 30 tablet, Rfl: 0  Review of Systems  Constitutional: Positive for fatigue and malaise/fatigue. Negative for activity change, appetite change, chills, diaphoresis, fever and unexpected weight change.  HENT: Positive for congestion, nosebleeds, postnasal drip, rhinorrhea, sinus pain, sinus pressure and sneezing. Negative for ear discharge, ear pain and sore throat.   Eyes: Negative.   Respiratory: Positive for cough and wheezing. Negative for apnea, choking, chest tightness and shortness of breath.   Neurological: Positive for weakness and headaches. Negative for dizziness and light-headedness.    Social History  Substance Use Topics  . Smoking status: Former Smoker    Packs/day: 1.00    Years: 30.00    Types: Cigarettes    Quit date: 08/01/2008  . Smokeless tobacco: Never Used  . Alcohol use Yes     Comment: occasional   Objective:   BP (!) 144/76 (BP Location: Left Arm, Patient Position: Sitting, Cuff Size: Normal)   Pulse 80   Temp 98.3 F (36.8 C) (Oral)   Resp 16   Wt 151 lb (68.5 kg)   BMI 26.75 kg/m   Physical Exam  Constitutional: She is oriented to person, place, and time. She appears well-developed and well-nourished. She does not appear ill.  Neck: Neck supple.  Cardiovascular: Normal rate, regular rhythm and normal heart sounds.   Pulmonary/Chest: Effort normal. No respiratory distress. She has wheezes in the right upper field. She has no rales.    Lymphadenopathy:    She has no cervical adenopathy.  Neurological: She is alert and oriented to person, place, and time.  Skin: Skin is warm and dry.  Psychiatric: She has a normal mood and affect. Her behavior is normal.        Assessment & Plan:     1. History of pneumonia  Patient appears better clinically today. She is afebrile, nontoxic looking. Some wheezing but no rales. She has completed her course of Levaquin and may restart her Celexa and Doxepin. She may resume activity as tolerated and was instructed not to engage in anything to strenuous too quickly, and to build endurance back slowly. She does not need to take the rest of her Ceftin.   2. Anxiety  Refilled today.   - citalopram (CELEXA) 20 MG tablet; Take  1 tablet (20 mg total) by mouth daily.  Dispense: 90 tablet; Refill: 1  Return if symptoms worsen or fail to improve.  The entirety of the information documented in the History of Present Illness, Review of Systems and Physical Exam were personally obtained by me. Portions of this information were initially documented by Ashley Royalty, CMA and reviewed by me for thoroughness and accuracy.   Patient Instructions  Community-Acquired Pneumonia, Adult Introduction Pneumonia is an infection of the lungs. One type of pneumonia can happen while a person is in a hospital. A different type can happen when a person is not in a hospital (community-acquired pneumonia). It is easy for this kind to spread from person to person. It can spread to you if you breathe near an infected person who coughs or sneezes. Some symptoms include:  A dry cough.  A wet (productive) cough.  Fever.  Sweating.  Chest pain. Follow these instructions at home:  Take over-the-counter and prescription medicines only as told by your doctor.  Only take cough medicine if you are losing sleep.  If you were prescribed an antibiotic medicine, take it as told by your doctor. Do not stop taking the  antibiotic even if you start to feel better.  Sleep with your head and neck raised (elevated). You can do this by putting a few pillows under your head, or you can sleep in a recliner.  Do not use tobacco products. These include cigarettes, chewing tobacco, and e-cigarettes. If you need help quitting, ask your doctor.  Drink enough water to keep your pee (urine) clear or pale yellow. A shot (vaccine) can help prevent pneumonia. Shots are often suggested for:  People older than 69 years of age.  People older than 69 years of age:  Who are having cancer treatment.  Who have long-term (chronic) lung disease.  Who have problems with their body's defense system (immune system). You may also prevent pneumonia if you take these actions:  Get the flu (influenza) shot every year.  Go to the dentist as often as told.  Wash your hands often. If soap and water are not available, use hand sanitizer. Contact a doctor if:  You have a fever.  You lose sleep because your cough medicine does not help. Get help right away if:  You are short of breath and it gets worse.  You have more chest pain.  Your sickness gets worse. This is very serious if:  You are an older adult.  Your body's defense system is weak.  You cough up blood. This information is not intended to replace advice given to you by your health care provider. Make sure you discuss any questions you have with your health care provider. Document Released: 04/20/2008 Document Revised: 04/09/2016 Document Reviewed: 02/27/2015  2017 [place], PA-C  [facility]

## 2016-11-18 DIAGNOSIS — R778 Other specified abnormalities of plasma proteins: Secondary | ICD-10-CM | POA: Insufficient documentation

## 2016-11-18 DIAGNOSIS — R7989 Other specified abnormal findings of blood chemistry: Secondary | ICD-10-CM

## 2016-12-11 ENCOUNTER — Other Ambulatory Visit: Payer: Self-pay

## 2016-12-11 MED ORDER — MONTELUKAST SODIUM 10 MG PO TABS: 10.0000 mg | ORAL_TABLET | Freq: Every day | ORAL | 1 refills | Status: DC

## 2016-12-11 NOTE — Telephone Encounter (Signed)
Pharmacy requesting refill Last ov 11/17/16 Last filled 7/26/107 Please review. Thank you. sd

## 2017-01-18 ENCOUNTER — Other Ambulatory Visit: Payer: Self-pay

## 2017-01-18 DIAGNOSIS — F419 Anxiety disorder, unspecified: Secondary | ICD-10-CM

## 2017-01-18 NOTE — Telephone Encounter (Signed)
Last ov 11/25/16 Last filled 05/25/2016 Please review. Thank you. sd

## 2017-01-19 ENCOUNTER — Telehealth: Payer: Self-pay

## 2017-01-19 MED ORDER — LORAZEPAM 0.5 MG PO TABS: 0.5000 mg | ORAL_TABLET | Freq: Every day | ORAL | 0 refills | Status: DC | PRN

## 2017-01-19 NOTE — Telephone Encounter (Signed)
Refill Request for Lorazepam 0.5 MG QTY:90  Sorry, it looks like [facility] had send this to you.  Thanks.

## 2017-01-19 NOTE — Telephone Encounter (Signed)
Called into [facility] 

## 2017-01-26 ENCOUNTER — Emergency Department: Payer: Medicare Other

## 2017-01-26 ENCOUNTER — Encounter: Payer: Self-pay | Admitting: Emergency Medicine

## 2017-01-26 ENCOUNTER — Inpatient Hospital Stay
Admission: EM | Admit: 2017-01-26 | Discharge: 2017-01-29 | DRG: 535 | Disposition: A | Discharge status: Skilled Nursing Facility | Payer: Medicare Other | Attending: Internal Medicine | Admitting: Internal Medicine

## 2017-01-26 DIAGNOSIS — R0902 Hypoxemia: Secondary | ICD-10-CM

## 2017-01-26 DIAGNOSIS — R740 Nonspecific elevation of levels of transaminase and lactic acid dehydrogenase [LDH]: Secondary | ICD-10-CM | POA: Diagnosis present

## 2017-01-26 DIAGNOSIS — Z79899 Other long term (current) drug therapy: Secondary | ICD-10-CM

## 2017-01-26 DIAGNOSIS — I10 Essential (primary) hypertension: Secondary | ICD-10-CM | POA: Diagnosis present

## 2017-01-26 DIAGNOSIS — Z87891 Personal history of nicotine dependence: Secondary | ICD-10-CM

## 2017-01-26 DIAGNOSIS — S32502A Unspecified fracture of left pubis, initial encounter for closed fracture: Principal | ICD-10-CM | POA: Diagnosis present

## 2017-01-26 DIAGNOSIS — I252 Old myocardial infarction: Secondary | ICD-10-CM | POA: Diagnosis not present

## 2017-01-26 DIAGNOSIS — Y906 Blood alcohol level of 120-199 mg/100 ml: Secondary | ICD-10-CM | POA: Diagnosis present

## 2017-01-26 DIAGNOSIS — Z833 Family history of diabetes mellitus: Secondary | ICD-10-CM

## 2017-01-26 DIAGNOSIS — Y939 Activity, unspecified: Secondary | ICD-10-CM | POA: Diagnosis not present

## 2017-01-26 DIAGNOSIS — W1830XA Fall on same level, unspecified, initial encounter: Secondary | ICD-10-CM | POA: Diagnosis present

## 2017-01-26 DIAGNOSIS — Z7951 Long term (current) use of inhaled steroids: Secondary | ICD-10-CM | POA: Diagnosis not present

## 2017-01-26 DIAGNOSIS — E785 Hyperlipidemia, unspecified: Secondary | ICD-10-CM | POA: Diagnosis present

## 2017-01-26 DIAGNOSIS — G8911 Acute pain due to trauma: Secondary | ICD-10-CM

## 2017-01-26 DIAGNOSIS — Z8673 Personal history of transient ischemic attack (TIA), and cerebral infarction without residual deficits: Secondary | ICD-10-CM

## 2017-01-26 DIAGNOSIS — I2510 Atherosclerotic heart disease of native coronary artery without angina pectoris: Secondary | ICD-10-CM | POA: Diagnosis present

## 2017-01-26 DIAGNOSIS — Z881 Allergy status to other antibiotic agents status: Secondary | ICD-10-CM | POA: Diagnosis not present

## 2017-01-26 DIAGNOSIS — Z7983 Long term (current) use of bisphosphonates: Secondary | ICD-10-CM | POA: Diagnosis not present

## 2017-01-26 DIAGNOSIS — K219 Gastro-esophageal reflux disease without esophagitis: Secondary | ICD-10-CM | POA: Diagnosis present

## 2017-01-26 DIAGNOSIS — F1010 Alcohol abuse, uncomplicated: Secondary | ICD-10-CM | POA: Diagnosis present

## 2017-01-26 DIAGNOSIS — E7800 Pure hypercholesterolemia, unspecified: Secondary | ICD-10-CM | POA: Diagnosis present

## 2017-01-26 DIAGNOSIS — S32401A Unspecified fracture of right acetabulum, initial encounter for closed fracture: Secondary | ICD-10-CM | POA: Diagnosis present

## 2017-01-26 DIAGNOSIS — S32411A Displaced fracture of anterior wall of right acetabulum, initial encounter for closed fracture: Secondary | ICD-10-CM

## 2017-01-26 DIAGNOSIS — Z8249 Family history of ischemic heart disease and other diseases of the circulatory system: Secondary | ICD-10-CM | POA: Diagnosis not present

## 2017-01-26 DIAGNOSIS — Z803 Family history of malignant neoplasm of breast: Secondary | ICD-10-CM

## 2017-01-26 DIAGNOSIS — K5090 Crohn's disease, unspecified, without complications: Secondary | ICD-10-CM | POA: Diagnosis present

## 2017-01-26 DIAGNOSIS — Z8041 Family history of malignant neoplasm of ovary: Secondary | ICD-10-CM

## 2017-01-26 DIAGNOSIS — E039 Hypothyroidism, unspecified: Secondary | ICD-10-CM | POA: Diagnosis present

## 2017-01-26 DIAGNOSIS — Z7982 Long term (current) use of aspirin: Secondary | ICD-10-CM

## 2017-01-26 DIAGNOSIS — S32592A Other specified fracture of left pubis, initial encounter for closed fracture: Secondary | ICD-10-CM

## 2017-01-26 DIAGNOSIS — J439 Emphysema, unspecified: Secondary | ICD-10-CM | POA: Diagnosis present

## 2017-01-26 DIAGNOSIS — Z800 Family history of malignant neoplasm of digestive organs: Secondary | ICD-10-CM

## 2017-01-26 DIAGNOSIS — S72009A Fracture of unspecified part of neck of unspecified femur, initial encounter for closed fracture: Secondary | ICD-10-CM

## 2017-01-26 DIAGNOSIS — Y92010 Kitchen of single-family (private) house as the place of occurrence of the external cause: Secondary | ICD-10-CM | POA: Diagnosis not present

## 2017-01-26 DIAGNOSIS — Z885 Allergy status to narcotic agent status: Secondary | ICD-10-CM

## 2017-01-26 HISTORY — DX: Fracture of unspecified part of neck of unspecified femur, initial encounter for closed fracture: S72.009A

## 2017-01-26 LAB — COMPREHENSIVE METABOLIC PANEL
ALT: 142 U/L — ABNORMAL HIGH (ref 14–54)
AST: 192 U/L — AB (ref 15–41)
Albumin: 4.1 g/dL (ref 3.5–5.0)
Alkaline Phosphatase: 116 U/L (ref 38–126)
Anion gap: 8 (ref 5–15)
BILIRUBIN TOTAL: 1 mg/dL (ref 0.3–1.2)
BUN: 8 mg/dL (ref 6–20)
CHLORIDE: 103 mmol/L (ref 101–111)
CO2: 24 mmol/L (ref 22–32)
CREATININE: 0.64 mg/dL (ref 0.44–1.00)
Calcium: 8.7 mg/dL — ABNORMAL LOW (ref 8.9–10.3)
GFR calc Af Amer: >60 mL/min (ref >60)
GLUCOSE: 118 mg/dL — AB (ref 65–99)
Potassium: 3.9 mmol/L (ref 3.5–5.1)
Sodium: 135 mmol/L (ref 135–145)
Total Protein: 7.6 g/dL (ref 6.5–8.1)

## 2017-01-26 LAB — CBC WITH DIFFERENTIAL/PLATELET
BASOS PCT: 0 %
Basophils Absolute: 0 10*3/uL (ref 0–0.1)
EOS ABS: 0.5 10*3/uL (ref 0–0.7)
Eosinophils Relative: 5 %
HEMATOCRIT: 36.9 % (ref 35.0–47.0)
Hemoglobin: 12.8 g/dL (ref 12.0–16.0)
LYMPHS PCT: 27 %
Lymphs Abs: 2.5 10*3/uL (ref 1.0–3.6)
MCH: 31.2 pg (ref 26.0–34.0)
MCHC: 34.5 g/dL (ref 32.0–36.0)
MCV: 90.4 fL (ref 80.0–100.0)
Monocytes Absolute: 0.9 10*3/uL (ref 0.2–0.9)
Monocytes Relative: 9 %
Neutro Abs: 5.4 10*3/uL (ref 1.4–6.5)
Neutrophils Relative %: 59 %
Platelets: 143 10*3/uL — ABNORMAL LOW (ref 150–440)
RBC: 4.08 MIL/uL (ref 3.80–5.20)
RDW: 12.4 % (ref 11.5–14.5)
WBC: 9.3 10*3/uL (ref 3.6–11.0)

## 2017-01-26 LAB — T4, FREE: Free T4: 0.79 ng/dL (ref 0.61–1.12)

## 2017-01-26 LAB — PROTIME-INR
INR: 1.13
PROTHROMBIN TIME: 14.6 s (ref 11.4–15.2)

## 2017-01-26 LAB — ETHANOL: ALCOHOL ETHYL (B): 173 mg/dL — AB (ref <5)

## 2017-01-26 LAB — TSH: TSH: 6.432 u[IU]/mL — ABNORMAL HIGH (ref 0.350–4.500)

## 2017-01-26 LAB — SURGICAL PCR SCREEN
MRSA, PCR: NEGATIVE
Staphylococcus aureus: NEGATIVE

## 2017-01-26 MED ORDER — FOLIC ACID 1 MG PO TABS
1.0000 mg | ORAL_TABLET | Freq: Every day | ORAL | Status: DC
  Administered 2017-01-26 – 2017-01-29 (×4): 1 mg via ORAL
  Filled 2017-01-26 (×4): qty 1

## 2017-01-26 MED ORDER — VITAMIN B-1 100 MG PO TABS
100.0000 mg | ORAL_TABLET | Freq: Every day | ORAL | Status: DC
  Administered 2017-01-26 – 2017-01-29 (×4): 100 mg via ORAL
  Filled 2017-01-26 (×4): qty 1

## 2017-01-26 MED ORDER — SODIUM CHLORIDE 0.9% FLUSH
3.0000 mL | Freq: Two times a day (BID) | INTRAVENOUS | Status: DC
  Administered 2017-01-27 – 2017-01-28 (×3): 3 mL via INTRAVENOUS

## 2017-01-26 MED ORDER — SODIUM CHLORIDE 0.9 % IV SOLN
INTRAVENOUS | Status: DC
  Administered 2017-01-26 – 2017-01-27 (×3): via INTRAVENOUS

## 2017-01-26 MED ORDER — CHLORTHALIDONE 25 MG PO TABS
25.0000 mg | ORAL_TABLET | Freq: Every day | ORAL | Status: DC
  Filled 2017-01-26 (×2): qty 1

## 2017-01-26 MED ORDER — TIOTROPIUM BROMIDE MONOHYDRATE 18 MCG IN CAPS
1.0000 | ORAL_CAPSULE | Freq: Every day | RESPIRATORY_TRACT | Status: DC
  Administered 2017-01-26 – 2017-01-29 (×4): 18 ug via RESPIRATORY_TRACT
  Filled 2017-01-26: qty 5

## 2017-01-26 MED ORDER — THIAMINE HCL 100 MG/ML IJ SOLN: 100.0000 mg | Freq: Every day | INTRAMUSCULAR | Status: DC

## 2017-01-26 MED ORDER — ONDANSETRON HCL 4 MG PO TABS: 4.0000 mg | ORAL_TABLET | Freq: Four times a day (QID) | ORAL | Status: DC | PRN

## 2017-01-26 MED ORDER — ACETAMINOPHEN 325 MG PO TABS: 650.0000 mg | ORAL_TABLET | Freq: Four times a day (QID) | ORAL | Status: DC | PRN

## 2017-01-26 MED ORDER — DOCUSATE SODIUM 100 MG PO CAPS
100.0000 mg | ORAL_CAPSULE | Freq: Two times a day (BID) | ORAL | Status: DC
  Administered 2017-01-26 – 2017-01-29 (×7): 100 mg via ORAL
  Filled 2017-01-26 (×7): qty 1

## 2017-01-26 MED ORDER — OMEGA-3-ACID ETHYL ESTERS 1 G PO CAPS
1.0000 g | ORAL_CAPSULE | Freq: Two times a day (BID) | ORAL | Status: DC
  Administered 2017-01-26 – 2017-01-29 (×7): 1 g via ORAL
  Filled 2017-01-26 (×7): qty 1

## 2017-01-26 MED ORDER — LEVOTHYROXINE SODIUM 50 MCG PO TABS
50.0000 ug | ORAL_TABLET | Freq: Every day | ORAL | Status: DC
  Administered 2017-01-26 – 2017-01-27 (×2): 50 ug via ORAL
  Filled 2017-01-26 (×3): qty 1

## 2017-01-26 MED ORDER — MORPHINE SULFATE (PF) 2 MG/ML IV SOLN
2.0000 mg | INTRAVENOUS | Status: DC | PRN
  Administered 2017-01-26 (×2): 2 mg via INTRAVENOUS
  Administered 2017-01-26: 4 mg via INTRAVENOUS
  Administered 2017-01-26: 2 mg via INTRAVENOUS
  Filled 2017-01-26: qty 1
  Filled 2017-01-26: qty 2
  Filled 2017-01-26 (×2): qty 1

## 2017-01-26 MED ORDER — ENOXAPARIN SODIUM 40 MG/0.4ML ~~LOC~~ SOLN
40.0000 mg | SUBCUTANEOUS | Status: DC
  Administered 2017-01-26 – 2017-01-28 (×3): 40 mg via SUBCUTANEOUS
  Filled 2017-01-26 (×3): qty 0.4

## 2017-01-26 MED ORDER — MONTELUKAST SODIUM 10 MG PO TABS
10.0000 mg | ORAL_TABLET | Freq: Every day | ORAL | Status: DC
  Administered 2017-01-26 – 2017-01-29 (×4): 10 mg via ORAL
  Filled 2017-01-26 (×4): qty 1

## 2017-01-26 MED ORDER — HYDROMORPHONE HCL 1 MG/ML IJ SOLN
1.0000 mg | INTRAMUSCULAR | Status: DC | PRN
  Administered 2017-01-26 – 2017-01-28 (×11): 1 mg via INTRAVENOUS
  Filled 2017-01-26 (×11): qty 1

## 2017-01-26 MED ORDER — LORAZEPAM 1 MG PO TABS: 1.0000 mg | ORAL_TABLET | Freq: Four times a day (QID) | ORAL | Status: DC | PRN

## 2017-01-26 MED ORDER — ADULT MULTIVITAMIN W/MINERALS CH
1.0000 | ORAL_TABLET | Freq: Every day | ORAL | Status: DC
  Administered 2017-01-26 – 2017-01-29 (×4): 1 via ORAL
  Filled 2017-01-26 (×4): qty 1

## 2017-01-26 MED ORDER — LORAZEPAM 2 MG/ML IJ SOLN: 0.0000 mg | Freq: Two times a day (BID) | INTRAMUSCULAR | Status: DC

## 2017-01-26 MED ORDER — CITALOPRAM HYDROBROMIDE 20 MG PO TABS
20.0000 mg | ORAL_TABLET | Freq: Every day | ORAL | Status: DC
  Administered 2017-01-26 – 2017-01-28 (×3): 20 mg via ORAL
  Filled 2017-01-26 (×3): qty 1

## 2017-01-26 MED ORDER — SIMVASTATIN 20 MG PO TABS
40.0000 mg | ORAL_TABLET | Freq: Every day | ORAL | Status: DC
Start: 2017-01-26 — End: 2017-01-29
  Administered 2017-01-26 – 2017-01-28 (×3): 40 mg via ORAL
  Filled 2017-01-26 (×3): qty 2

## 2017-01-26 MED ORDER — LORAZEPAM 2 MG/ML IJ SOLN
0.0000 mg | INTRAMUSCULAR | Status: DC
  Filled 2017-01-26: qty 2

## 2017-01-26 MED ORDER — PANTOPRAZOLE SODIUM 40 MG PO TBEC
40.0000 mg | DELAYED_RELEASE_TABLET | Freq: Every day | ORAL | Status: DC
  Administered 2017-01-26 – 2017-01-29 (×4): 40 mg via ORAL
  Filled 2017-01-26 (×4): qty 1

## 2017-01-26 MED ORDER — ONDANSETRON HCL 4 MG/2ML IJ SOLN
4.0000 mg | Freq: Four times a day (QID) | INTRAMUSCULAR | Status: DC | PRN
  Administered 2017-01-27 – 2017-01-28 (×2): 4 mg via INTRAVENOUS
  Filled 2017-01-26 (×2): qty 2

## 2017-01-26 MED ORDER — LORAZEPAM 2 MG/ML IJ SOLN: 1.0000 mg | Freq: Four times a day (QID) | INTRAMUSCULAR | Status: DC | PRN

## 2017-01-26 MED ORDER — ACETAMINOPHEN 650 MG RE SUPP: 650.0000 mg | Freq: Four times a day (QID) | RECTAL | Status: DC | PRN

## 2017-01-26 MED ORDER — DOXEPIN HCL 25 MG PO CAPS
25.0000 mg | ORAL_CAPSULE | Freq: Every day | ORAL | Status: DC
  Administered 2017-01-26 – 2017-01-28 (×3): 25 mg via ORAL
  Filled 2017-01-26 (×3): qty 1

## 2017-01-26 MED ORDER — FLUTICASONE FUROATE-VILANTEROL 100-25 MCG/INH IN AEPB
1.0000 | INHALATION_SPRAY | Freq: Every day | RESPIRATORY_TRACT | Status: DC
  Administered 2017-01-26 – 2017-01-29 (×4): 1 via RESPIRATORY_TRACT
  Filled 2017-01-26: qty 28

## 2017-01-26 MED ORDER — ALBUTEROL SULFATE (2.5 MG/3ML) 0.083% IN NEBU: 2.5000 mg | INHALATION_SOLUTION | RESPIRATORY_TRACT | Status: DC | PRN

## 2017-01-26 MED ORDER — ASPIRIN EC 81 MG PO TBEC
81.0000 mg | DELAYED_RELEASE_TABLET | Freq: Every day | ORAL | Status: DC
  Administered 2017-01-26 – 2017-01-29 (×4): 81 mg via ORAL
  Filled 2017-01-26 (×4): qty 1

## 2017-01-26 MED ORDER — ENOXAPARIN SODIUM 40 MG/0.4ML ~~LOC~~ SOLN: 40.0000 mg | SUBCUTANEOUS | Status: DC

## 2017-01-26 MED ORDER — LISINOPRIL 10 MG PO TABS
10.0000 mg | ORAL_TABLET | Freq: Every day | ORAL | Status: DC
  Administered 2017-01-26 – 2017-01-27 (×2): 10 mg via ORAL
  Filled 2017-01-26 (×2): qty 1

## 2017-01-26 MED ORDER — ALPRAZOLAM 0.5 MG PO TABS: 0.5000 mg | ORAL_TABLET | Freq: Every evening | ORAL | Status: DC | PRN

## 2017-01-26 MED ORDER — POTASSIUM CHLORIDE CRYS ER 20 MEQ PO TBCR
20.0000 meq | EXTENDED_RELEASE_TABLET | Freq: Every day | ORAL | Status: DC
  Administered 2017-01-26 – 2017-01-29 (×4): 20 meq via ORAL
  Filled 2017-01-26 (×4): qty 1

## 2017-01-26 MED ORDER — VITAMIN D 1000 UNITS PO TABS
2000.0000 [IU] | ORAL_TABLET | Freq: Every day | ORAL | Status: DC
  Administered 2017-01-26 – 2017-01-29 (×4): 2000 [IU] via ORAL
  Filled 2017-01-26 (×4): qty 2

## 2017-01-26 NOTE — Clinical Social Work Note (Addendum)
Clinical Social Work Assessment  Patient Details  Name: Lori Brady MRN: [id number] Date of Birth: 1946/09/11  Date of referral:  01/26/17               Reason for consult:  Facility Placement, Discharge Planning                Permission sought to share information with:  Chartered certified accountant granted to share information::  Yes, Verbal Permission Granted  Name::      [place]::   [place]   Relationship::     Contact Information:     Housing/Transportation Living arrangements for the past 2 months:  [place] of Information:  Patient, Adult Children Patient Interpreter Needed:  None Criminal Activity/Legal Involvement Pertinent to Current Situation/Hospitalization:  No - Comment as needed Significant Relationships:  Adult Children, Siblings, Other Family Members Lives with:  Adult Children, Siblings Do you feel safe going back to the place where you live?  Yes Need for family participation in patient care:  Yes (Comment)  Care giving concerns:  Patient lives in [place] with her youngest son Gerald Stabs and sister Hoyle Sauer.    Social Worker assessment / plan: Holiday representative (CSW) noted in patients chart that ortho consult was pending and patient has a hip fracture. Social work Theatre manager met with patient and patient's son Gerald Stabs at bedside. Patient was laying in bed watching TV and was alert and oriented x4. Social work Theatre manager introduced herself and explained role of social work department. Per patient, she lives at home with her son Gerald Stabs and sister Hoyle Sauer in [place]. Patient has two adult sons and several grandchildren that live in the area. Patient does not have HPOA at this time. Social work Theatre manager explained that an ortho consult is still pending. Social work Theatre manager explained that if patient was to have surgery, PT will work with patient after surgery and determine if she needs rehab or can go home and receive home  health. Patient verbally agreed she understood. Patient does not have a preference of SNF or home health at this time. CSW noted in patient's chart that patient's alcohol levels were elevated. Patient stated "I had a few drinks last night when I was watching The Voice, but I was not drunk". Patient also stated that she does not drink on a regular basis and has never received treatment or help due to alcohol, as it has never been a problem. Social work Theatre manager provided [employer] to patient. Patient is agreeable to SNF search in [facility] if needed. Social work Theatre manager will continue to assist and follow as needed.   Fl2 completed and faxed out.   Employment status:  Unemployed Forensic scientist:  Programmer, applications ([employer]) PT Recommendations:  Not assessed at this time Information / Referral to community resources:  [place]  Patient/Family's Response to care:  Patient does not have a preference of a SNF or a home health agency at this time.   Patient/Family's Understanding of and Emotional Response to Diagnosis, Current Treatment, and Prognosis:  Patient and patient's son was pleasant and thanked social work Theatre manager for visiting.   Emotional Assessment Appearance:  Appears stated age Attitude/Demeanor/Rapport:    Affect (typically observed):  Accepting, Adaptable, Appropriate Orientation:  Oriented to Self, Oriented to Place, Oriented to  Time, Oriented to Situation Alcohol / Substance use:  Not Applicable Psych involvement (Current and /or in the community):  No (Comment)  Discharge Needs  Concerns to be addressed:  Basic Needs Readmission within the last 30 days:  No Current discharge risk:  Dependent with Mobility Barriers to Discharge:  Continued Medical Work up   [employer], [place] Work 01/26/2017, 9:37 AM

## 2017-01-26 NOTE — ED Notes (Signed)
Pt has taken sedating medication that she normally takes at night for sleeping.

## 2017-01-26 NOTE — ED Triage Notes (Signed)
Pt. States she was in kitchen at home when she bent over to pick something off floor pt. Lost balance.  Pt. Has pain to rt. Hip and skin tear on rt. Elbow.

## 2017-01-26 NOTE — Consult Note (Signed)
ORTHOPAEDIC CONSULTATION  REQUESTING PHYSICIAN: Dustin Flock, MD  Chief Complaint: pelvic fractures   HPI: Lori Brady is a 71 y.o. female who complains of pain in the right greater than left hip status post fall at home.  She was admitted to the hospitalist service from the ER early this morning once a CT scan of the hip revealed topics fractures.  Patient is reported to have an elevated alcohol level upon admission.  Patient is seen in her hospital room this evening with her family at the bedside.  Past Medical History:  Diagnosis Date  . Arthritis   . Asthma   . Crohn's disease ([place])   . Emphysema of lung ([place])   . GERD (gastroesophageal reflux disease)   . History of chicken pox   . History of heart attack   . Hx of completed stroke   . Hypercholesteremia   . Hypertension   . Pneumonia   . Seasonal allergies   . Sepsis ([place])   . UTI (lower urinary tract infection)    Past Surgical History:  Procedure Laterality Date  . BREAST BIOPSY Bilateral 1960's to 90's   Social History   Social History  . Marital status: Widowed    Spouse name: N/A  . Number of children: N/A  . Years of education: N/A   Social History Main Topics  . Smoking status: Former Smoker    Packs/day: 1.00    Years: 30.00    Types: Cigarettes    Quit date: 08/01/2008  . Smokeless tobacco: Never Used  . Alcohol use Yes     Comment: occasional  . Drug use: No  . Sexual activity: Not Asked   Other Topics Concern  . None   Social History Narrative  . None   Family History  Problem Relation Age of Onset  . Cancer Sister     ovarian/uterine/breast  . Breast cancer Sister 38  . Cancer Sister     breast  . Heart disease Sister   . Breast cancer Sister 21  . Heart disease Father   . Cancer Father     liver  . Cancer Mother   . Heart disease Brother   . Cancer Brother     liver  . Heart disease Sister   . Diabetes Maternal Grandmother    Allergies  Allergen Reactions  . Levaquin  [Levofloxacin] Nausea Only  . Vicodin [Hydrocodone-Acetaminophen] Nausea Only   Prior to Admission medications   Medication Sig Start Date End Date Taking? Authorizing Provider  albuterol (ACCUNEB) 0.63 MG/3ML nebulizer solution Take 1 ampule by nebulization every 6 (six) hours as needed for wheezing.   Yes Historical Provider, MD  albuterol (PROVENTIL HFA;VENTOLIN HFA) 108 (90 Base) MCG/ACT inhaler Inhale 2 puffs into the lungs every 4 (four) hours as needed for wheezing or shortness of breath.   Yes Historical Provider, MD  alendronate (FOSAMAX) 70 MG tablet Take 1 tablet (70 mg total) by mouth once a week. 07/15/16  Yes Birdie Sons, MD  aspirin 81 MG tablet Take 81 mg by mouth daily.   Yes Historical Provider, MD  chlorthalidone (HYGROTON) 25 MG tablet Take 1 tablet (25 mg total) by mouth daily. 10/19/16  Yes Mar Daring, PA-C  Cholecalciferol (VITAMIN D) 2000 UNITS CAPS Take 1 capsule by mouth daily.   Yes Historical Provider, MD  citalopram (CELEXA) 20 MG tablet Take 1 tablet (20 mg total) by mouth daily. Patient taking differently: Take 20 mg by mouth at bedtime.  11/17/16  Yes Trinna Post, PA-C  doxepin (SINEQUAN) 25 MG capsule Take 25 mg by mouth at bedtime.  01/06/16  Yes Historical Provider, MD  Fluticasone Furoate-Vilanterol (BREO ELLIPTA) 100-25 MCG/INH AEPB Inhale 1 puff into the lungs daily.    Yes Historical Provider, MD  hydrOXYzine (ATARAX/VISTARIL) 10 MG tablet Take 10-20 mg by mouth at bedtime.   Yes Historical Provider, MD  lisinopril (PRINIVIL,ZESTRIL) 10 MG tablet Take 10 mg by mouth daily.  01/01/16  Yes Historical Provider, MD  LORazepam (ATIVAN) 0.5 MG tablet Take 0.5 mg by mouth daily as needed for anxiety.    Yes Historical Provider, MD  montelukast (SINGULAIR) 10 MG tablet Take 1 tablet (10 mg total) by mouth daily. 12/11/16  Yes Trinna Post, PA-C  Omega-3 Fatty Acids (FISH OIL) 1000 MG CAPS Take 1 capsule by mouth daily.   Yes Historical Provider, MD   potassium chloride SA (K-DUR,KLOR-CON) 20 MEQ tablet Take 20 mEq by mouth daily.   Yes Historical Provider, MD  simvastatin (ZOCOR) 40 MG tablet TAKE 1 TABLET BY MOUTH AT BEDTIME. 01/01/16  Yes Margarita Rana, MD  Tiotropium Bromide Monohydrate (SPIRIVA RESPIMAT) 2.5 MCG/ACT AERS Inhale 2 puffs into the lungs daily.   Yes Historical Provider, MD  ALPRAZolam Duanne Moron) 0.5 MG tablet Take 0.5 mg by mouth at bedtime as needed for anxiety.    Historical Provider, MD  betamethasone valerate (VALISONE) 0.1 % cream APPLY TOPICALLY TO AFFECTED AREA SPARINGLY 2 TIMES DAILY. 07/10/15   Margarita Rana, MD   Ct Hip Right Wo Contrast  Result Date: 01/26/2017 CLINICAL DATA:  71 year old female with fall and right hip pain. EXAM: CT OF THE RIGHT HIP WITHOUT CONTRAST TECHNIQUE: Multidetector CT imaging of the right hip was performed according to the standard protocol. Multiplanar CT image reconstructions were also generated. COMPARISON:  Right hip radiograph dated 01/26/2017 FINDINGS: Bones/Joint/Cartilage There is a faint linear lucency in the anterior right acetabulum concerning for nondisplaced anterior column fracture. There is a nondisplaced fracture of the left pubic ramus adjacent to the symphysis pubis. No definite other fracture identified, however evaluation for fracture is very limited due to osteopenia. There is no dislocation. Ligaments Suboptimally assessed by CT. Muscles and Tendons No intramuscular hematoma or injury. Soft tissues Unremarkable. IMPRESSION: Probable nondisplaced fracture of the anterior column of the right acetabulum as well as nondisplaced fracture of left pubic ramus adjacent to symphysis pubis. Evaluation for fracture is very limited due to osteopenia. MRI may provide better evaluation if there is high clinical concern for femoral fracture. No dislocation. Electronically Signed   By: Anner Crete M.D.   On: 01/26/2017 02:35   Dg Hip Unilat With Pelvis 2-3 Views Right  Result Date:  01/26/2017 CLINICAL DATA:  Right hip pain after a fall. EXAM: DG HIP (WITH OR WITHOUT PELVIS) 2-3V RIGHT COMPARISON:  None. FINDINGS: Mild degenerative changes in the right hip with narrowing and sclerosis of the superior acetabular joint and osteophytes predominantly on the acetabular surface. No evidence of acute fracture or dislocation in the right hip. Pelvis appears intact. SI joints and symphysis pubis are not displaced. Can't exclude sclerosis in the SI joints although overlying bowel gas limits evaluation. No focal bone lesion or bone destruction. Soft tissues are unremarkable. IMPRESSION: Mild degenerative changes in the right hip. No acute displaced fractures identified. Electronically Signed   By: Lucienne Capers M.D.   On: 01/26/2017 00:35    Positive ROS: All other systems have been reviewed and were otherwise negative  with the exception of those mentioned in the HPI and as above.  Physical Exam: General: Alert, no acute distress  MUSCULOSKELETAL:   Right lower extremity: Patient is no shortening or external rotation. She can flex and extend her toes and dorsiflex and plantarflex her ankle. He should palpable pedal pulses. She was able to actively flex her knee and hip approximately 45 without pain. She no pain with internal or external rotation of the right hip or log rolling. Patient had no significant pain over the intertrochanter over the anterior hip or pubis to palpation this evening.  Left lower extremity: Patient is no shortening or external rotation. She can flex and extend her toes and dorsiflex and plantarflex her ankle. He should palpable pedal pulses. She was able to actively flex her knee and hip approximately 45 without pain. She no pain with internal or external rotation of the right hip or log rolling. Patient had no significant pain over the intertrochanter over the anterior hip or pubis to palpation this evening.  Assessment: Nondisplaced fracture of the right  acetabulum and left superior rami  Plan: I splinted the patient and her family that she has sustained nondisplaced pelvic fractures. I do not recommend orthopedic surgical intervention for these injuries. These injuries may be treated nonoperatively. I recommend the patient be 25% partial weightbearing on the right lower extremity. She may weight-bear as tolerated on the left lower extremity.  I recommend physical therapy evaluation tomorrow. The patient may require skilled nursing facility upon discharge depending on her ability to participate with physical therapy. Continue current pain management. Patient may follow up with me in the office 2-4 weeks after discharge for reevaluation and x-ray.  I reviewed the patient's plain films and CT scan performed earlier today.  We'll defer to the hospitalist regarding anticoagulation while an inpatient.    Thornton Park, MD    01/26/2017 7:49 PM

## 2017-01-26 NOTE — H&P (Signed)
Lori Brady is an 71 y.o. female.   Chief Complaint: Fall HPI: The patient with past medical history of COPD and I'll call these presents emergency department complaining of pain after a fall. She states that she was trying to pick up something she dropped in the kitchen when she lost her balance. X-ray of her right hip in the emergency department revealed a nondisplaced fracture. Auditory evaluation was also significant for transaminitis and elevated alcohol level consistent with alcohol abuse. Due to her medical problems in addition to hip fracture the emergency department staff called the hospitalist service for admission.  Past Medical History:  Diagnosis Date  . Arthritis   . Asthma   . Crohn's disease ([place])   . Emphysema of lung ([place])   . GERD (gastroesophageal reflux disease)   . History of chicken pox   . History of heart attack   . Hx of completed stroke   . Hypercholesteremia   . Hypertension   . Pneumonia   . Seasonal allergies   . Sepsis ([place])   . UTI (lower urinary tract infection)     Past Surgical History:  Procedure Laterality Date  . BREAST BIOPSY Bilateral 1960's to 90's    Family History  Problem Relation Age of Onset  . Cancer Sister     ovarian/uterine/breast  . Breast cancer Sister 54  . Cancer Sister     breast  . Heart disease Sister   . Breast cancer Sister 18  . Heart disease Father   . Cancer Father     liver  . Cancer Mother   . Heart disease Brother   . Cancer Brother     liver  . Heart disease Sister   . Diabetes Maternal Grandmother    Social History:  reports that she quit smoking about 8 years ago. Her smoking use included Cigarettes. She has a 30.00 pack-year smoking history. She has never used smokeless tobacco. She reports that she drinks alcohol. She reports that she does not use drugs.  Allergies:  Allergies  Allergen Reactions  . Levaquin [Levofloxacin] Nausea Only  . Vicodin [Hydrocodone-Acetaminophen] Nausea Only     Medications Prior to Admission  Medication Sig Dispense Refill  . albuterol (ACCUNEB) 0.63 MG/3ML nebulizer solution Take 1 ampule by nebulization every 6 (six) hours as needed for wheezing.    Marland Kitchen albuterol (PROVENTIL HFA;VENTOLIN HFA) 108 (90 Base) MCG/ACT inhaler Inhale 2 puffs into the lungs every 4 (four) hours as needed for wheezing or shortness of breath.    Marland Kitchen alendronate (FOSAMAX) 70 MG tablet Take 1 tablet (70 mg total) by mouth once a week. 12 tablet 4  . ALPRAZolam (XANAX) 0.5 MG tablet Take 0.5 mg by mouth at bedtime as needed for anxiety.    Marland Kitchen aspirin 81 MG tablet Take 81 mg by mouth daily.    . chlorthalidone (HYGROTON) 25 MG tablet Take 1 tablet (25 mg total) by mouth daily. 30 tablet 0  . Cholecalciferol (VITAMIN D) 2000 UNITS CAPS Take 1 capsule by mouth daily.    . citalopram (CELEXA) 20 MG tablet Take 1 tablet (20 mg total) by mouth daily. (Patient taking differently: Take 20 mg by mouth at bedtime. ) 90 tablet 1  . doxepin (SINEQUAN) 25 MG capsule Take 25 mg by mouth at bedtime.     . Fluticasone Furoate-Vilanterol (BREO ELLIPTA) 100-25 MCG/INH AEPB Inhale 1 puff into the lungs daily.     . hydrOXYzine (ATARAX/VISTARIL) 10 MG tablet Take 10-20 mg by  mouth at bedtime.    Marland Kitchen lisinopril (PRINIVIL,ZESTRIL) 10 MG tablet Take 10 mg by mouth daily.     Marland Kitchen LORazepam (ATIVAN) 0.5 MG tablet Take 0.5 mg by mouth every 8 (eight) hours.     . montelukast (SINGULAIR) 10 MG tablet Take 1 tablet (10 mg total) by mouth daily. 90 tablet 1  . Omega-3 Fatty Acids (FISH OIL) 1000 MG CAPS Take 1 capsule by mouth daily.    . potassium chloride SA (K-DUR,KLOR-CON) 20 MEQ tablet Take 20 mEq by mouth daily.    . simvastatin (ZOCOR) 40 MG tablet TAKE 1 TABLET BY MOUTH AT BEDTIME. 90 tablet 3  . Tiotropium Bromide Monohydrate (SPIRIVA RESPIMAT) 2.5 MCG/ACT AERS Inhale 2 puffs into the lungs daily.    . betamethasone valerate (VALISONE) 0.1 % cream APPLY TOPICALLY TO AFFECTED AREA SPARINGLY 2 TIMES  DAILY. 15 g 5    Results for orders placed or performed during the hospital encounter of 01/26/17 (from the past 48 hour(s))  Comprehensive metabolic panel     Status: Abnormal   Collection Time: 01/26/17 12:08 AM  Result Value Ref Range   Sodium 135 135 - 145 mmol/L   Potassium 3.9 3.5 - 5.1 mmol/L   Chloride 103 101 - 111 mmol/L   CO2 24 22 - 32 mmol/L   Glucose, Bld 118 (H) 65 - 99 mg/dL   BUN 8 6 - 20 mg/dL   Creatinine, Ser 0.64 0.44 - 1.00 mg/dL   Calcium 8.7 (L) 8.9 - 10.3 mg/dL   Total Protein 7.6 6.5 - 8.1 g/dL   Albumin 4.1 3.5 - 5.0 g/dL   AST 192 (H) 15 - 41 U/L   ALT 142 (H) 14 - 54 U/L   Alkaline Phosphatase 116 38 - 126 U/L   Total Bilirubin 1.0 0.3 - 1.2 mg/dL   GFR calc non Af Amer >60 >60 mL/min   GFR calc Af Amer >60 >60 mL/min    Comment: (NOTE) The eGFR has been calculated using the CKD EPI equation. This calculation has not been validated in all clinical situations. eGFR's persistently <60 mL/min signify possible Chronic Kidney Disease.    Anion gap 8 5 - 15  Ethanol     Status: Abnormal   Collection Time: 01/26/17 12:08 AM  Result Value Ref Range   Alcohol, Ethyl (B) 173 (H) <5 mg/dL    Comment:        LOWEST DETECTABLE LIMIT FOR SERUM ALCOHOL IS 5 mg/dL FOR MEDICAL PURPOSES ONLY   CBC with Differential     Status: Abnormal   Collection Time: 01/26/17 12:08 AM  Result Value Ref Range   WBC 9.3 3.6 - 11.0 K/uL   RBC 4.08 3.80 - 5.20 MIL/uL   Hemoglobin 12.8 12.0 - 16.0 g/dL   HCT 36.9 35.0 - 47.0 %   MCV 90.4 80.0 - 100.0 fL   MCH 31.2 26.0 - 34.0 pg   MCHC 34.5 32.0 - 36.0 g/dL   RDW 12.4 11.5 - 14.5 %   Platelets 143 (L) 150 - 440 K/uL   Neutrophils Relative % 59 %   Neutro Abs 5.4 1.4 - 6.5 K/uL   Lymphocytes Relative 27 %   Lymphs Abs 2.5 1.0 - 3.6 K/uL   Monocytes Relative 9 %   Monocytes Absolute 0.9 0.2 - 0.9 K/uL   Eosinophils Relative 5 %   Eosinophils Absolute 0.5 0 - 0.7 K/uL   Basophils Relative 0 %   Basophils Absolute 0.0  0 -  0.1 K/uL  Protime-INR     Status: None   Collection Time: 01/26/17 12:08 AM  Result Value Ref Range   Prothrombin Time 14.6 11.4 - 15.2 seconds   INR 1.13   TSH     Status: Abnormal   Collection Time: 01/26/17 12:08 AM  Result Value Ref Range   TSH 6.432 (H) 0.350 - 4.500 uIU/mL    Comment: Performed by a 3rd Generation assay with a functional sensitivity of <=0.01 uIU/mL.  Surgical pcr screen     Status: None   Collection Time: 01/26/17  4:14 AM  Result Value Ref Range   MRSA, PCR NEGATIVE NEGATIVE   Staphylococcus aureus NEGATIVE NEGATIVE    Comment:        The Xpert SA Assay (FDA approved for NASAL specimens in patients over 40 years of age), is one component of a comprehensive surveillance program.  Test performance has been validated by [facility] for patients greater than or equal to 64 year old. It is not intended to diagnose infection nor to guide or monitor treatment.    Ct Hip Right Wo Contrast  Result Date: 01/26/2017 CLINICAL DATA:  71 year old female with fall and right hip pain. EXAM: CT OF THE RIGHT HIP WITHOUT CONTRAST TECHNIQUE: Multidetector CT imaging of the right hip was performed according to the standard protocol. Multiplanar CT image reconstructions were also generated. COMPARISON:  Right hip radiograph dated 01/26/2017 FINDINGS: Bones/Joint/Cartilage There is a faint linear lucency in the anterior right acetabulum concerning for nondisplaced anterior column fracture. There is a nondisplaced fracture of the left pubic ramus adjacent to the symphysis pubis. No definite other fracture identified, however evaluation for fracture is very limited due to osteopenia. There is no dislocation. Ligaments Suboptimally assessed by CT. Muscles and Tendons No intramuscular hematoma or injury. Soft tissues Unremarkable. IMPRESSION: Probable nondisplaced fracture of the anterior column of the right acetabulum as well as nondisplaced fracture of left pubic ramus adjacent to  symphysis pubis. Evaluation for fracture is very limited due to osteopenia. MRI may provide better evaluation if there is high clinical concern for femoral fracture. No dislocation. Electronically Signed   By: Anner Crete M.D.   On: 01/26/2017 02:35   Dg Hip Unilat With Pelvis 2-3 Views Right  Result Date: 01/26/2017 CLINICAL DATA:  Right hip pain after a fall. EXAM: DG HIP (WITH OR WITHOUT PELVIS) 2-3V RIGHT COMPARISON:  None. FINDINGS: Mild degenerative changes in the right hip with narrowing and sclerosis of the superior acetabular joint and osteophytes predominantly on the acetabular surface. No evidence of acute fracture or dislocation in the right hip. Pelvis appears intact. SI joints and symphysis pubis are not displaced. Can't exclude sclerosis in the SI joints although overlying bowel gas limits evaluation. No focal bone lesion or bone destruction. Soft tissues are unremarkable. IMPRESSION: Mild degenerative changes in the right hip. No acute displaced fractures identified. Electronically Signed   By: Lucienne Capers M.D.   On: 01/26/2017 00:35    Review of Systems  Constitutional: Negative for chills and fever.  HENT: Negative for sore throat and tinnitus.   Eyes: Negative for blurred vision and redness.  Respiratory: Positive for cough (chronic). Negative for shortness of breath.   Cardiovascular: Negative for chest pain, palpitations, orthopnea and PND.  Gastrointestinal: Negative for abdominal pain, diarrhea, nausea and vomiting.  Genitourinary: Negative for dysuria, frequency and urgency.  Musculoskeletal: Positive for joint pain. Negative for myalgias.  Skin: Negative for rash.  No lesions  Neurological: Negative for speech change, focal weakness and weakness.  Endo/Heme/Allergies: Does not bruise/bleed easily.       No temperature intolerance  Psychiatric/Behavioral: Negative for depression and suicidal ideas.    Blood pressure (!) 150/74, pulse (!) 101, temperature  98.9 F (37.2 C), temperature source Oral, resp. rate 20, height 5' 3"  (1.6 m), weight 68 kg (150 lb), SpO2 96 %. Physical Exam  Vitals reviewed. Constitutional: She is oriented to person, place, and time. She appears well-developed and well-nourished. No distress.  HENT:  Head: Normocephalic and atraumatic.  Mouth/Throat: Oropharynx is clear and moist.  Eyes: Conjunctivae and EOM are normal. Pupils are equal, round, and reactive to light. No scleral icterus.  Neck: Normal range of motion. Neck supple. No JVD present. No tracheal deviation present. No thyromegaly present.  Cardiovascular: Normal rate, regular rhythm and normal heart sounds.  Exam reveals no gallop and no friction rub.   No murmur heard. Respiratory: Effort normal and breath sounds normal.  GI: Soft. Bowel sounds are normal. She exhibits no distension. There is no tenderness.  Genitourinary:  Genitourinary Comments: Deferred  Musculoskeletal: She exhibits no edema.  Right hip movement limited by pain  Lymphadenopathy:    She has no cervical adenopathy.  Neurological: She is alert and oriented to person, place, and time. No cranial nerve deficit. She exhibits normal muscle tone.  Skin: Skin is warm and dry. No rash noted. No erythema.  Psychiatric: She has a normal mood and affect. Her behavior is normal. Judgment and thought content normal.     Assessment/Plan This is a 71 year old female admitted for right hip fracture. 1. Hip fracture: Management pain; consult orthopedic surgery. The patient is not yet an operative candidate due to elevated alcohol levels. 2. Alcohol abuse: I placed the patient on a CIWA scale. Ativan as needed. Hydrate with intravenous saline. 3. COPD: The patient has a chronic cough. She does not have increased sputum production or hypoxia. Continue inhalers per home regimen 4. CAD: Stable; continue aspirin 5. Hypertension: Relatively controlled; continue chlorthalidone and lisinopril 6.  Hypothyroidism: Continue Synthroid. Check TSH 7. Hyperlipidemia: Continue statin therapy 8. DVT prophylaxis: SCDs 9. GI prophylaxis: None The patient is a full code. Time spent on admission orders and patient care approximately 45 minutes  Harrie Foreman, MD 01/26/2017, 6:28 AM

## 2017-01-26 NOTE — Clinical Social Work Placement (Signed)
   CLINICAL SOCIAL WORK PLACEMENT  NOTE  Date:  01/26/2017  Patient Details  Name: Lori Brady MRN: [id number] Date of Birth: 1946/06/12  Clinical Social Work is seeking post-discharge placement for this patient at the Skilled  Nursing Facility level of care (*CSW will initial, date and re-position this form in  chart as items are completed):  Yes   Patient/family provided with [facility] Clinical Social Work Department's list of facilities offering this level of care within the geographic area requested by the patient (or if unable, by the patient's family).  Yes   Patient/family informed of their freedom to choose among providers that offer the needed level of care, that participate in Medicare, Medicaid or managed care program needed by the patient, have an available bed and are willing to accept the patient.  Yes   Patient/family informed of [facility] ownership interest in [facility] and [facility], as well as of the fact that they are under no obligation to receive care at these facilities.  PASRR submitted to EDS on 01/26/17     PASRR number received on 01/26/17     Existing PASRR number confirmed on       FL2 transmitted to all facilities in geographic area requested by pt/family on 01/26/17     FL2 transmitted to all facilities within larger geographic area on       Patient informed that his/her managed care company has contracts with or will negotiate with certain facilities, including the following:            Patient/family informed of bed offers received.  Patient chooses bed at       Physician recommends and patient chooses bed at      Patient to be transferred to   on  .  Patient to be transferred to facility by       Patient family notified on   of transfer.  Name of family member notified:        PHYSICIAN       Additional Comment:    _______________________________________________ Ralene Bathe, Student-Social Work 01/26/2017,  10:14 AM

## 2017-01-26 NOTE — [place] FL2 (Signed)
[place] MEDICAID FL2 LEVEL OF CARE SCREENING TOOL     IDENTIFICATION  Patient Name: Lori Brady Birthdate: 30-Mar-1946 Sex: female Admission Date (Current Location): 01/26/2017  [place] Number:  Chiropodist and Address:  [facility], [address] [place], [place] [address]      Provider Number: [id number]  Attending Physician Name and Address:  Auburn Bilberry, MD  Relative Name and Phone Number:       Current Level of Care: Hospital Recommended Level of Care: Skilled Nursing Facility Prior Approval Number:    Date Approved/Denied:   PASRR Number:  [id number])  Discharge Plan: SNF    Current Diagnoses: Patient Active Problem List   Diagnosis Date Noted  . Hip fracture (HCC) 01/26/2017  . Septic shock (HCC) 10/08/2016  . COPD (chronic obstructive pulmonary disease) with chronic bronchitis (HCC) 08/12/2016  . Asthma without status asthmaticus 03/31/2016  . Osteoporosis 03/31/2016  . Elevated LFTs 09/17/2015  . Parotid swelling 09/13/2015  . COPD exacerbation (HCC) 09/13/2015  . Shortness of breath 09/13/2015  . Urticaria 09/13/2015  . Cerebrovascular accident (CVA) (HCC) 08/21/2015  . Senile purpura (HCC) 08/21/2015  . Atrial tachycardia (HCC) 08/13/2015  . Eczema 07/10/2015  . Allergic rhinitis 06/26/2015  . Hyperkalemia 05/03/2015  . Anxiety 04/23/2015  . Edema leg 03/27/2015  . Fatigue 03/06/2015  . Primary cardiomyopathy (HCC) 03/05/2015  . Benign essential HTN 02/28/2015  . Combined fat and carbohydrate induced hyperlipemia 09/06/2014  . COPD, severe (HCC) 08/01/2014  . Acid reflux 08/01/2014  . Cough 08/01/2014    Orientation RESPIRATION BLADDER Height & Weight     Self, Time, Place, Situation  Normal Continent Weight: 150 lb (68 kg) Height:  5\' 3"  (160 cm)  BEHAVIORAL SYMPTOMS/MOOD NEUROLOGICAL BOWEL NUTRITION STATUS   (None. )  (None. ) Continent Diet (Diet: Heart Room )  AMBULATORY STATUS  COMMUNICATION OF NEEDS Skin   Extensive Assist Verbally Normal                       Personal Care Assistance Level of Assistance  Bathing, Feeding, Dressing Bathing Assistance: Limited assistance Feeding assistance: Independent Dressing Assistance: Limited assistance     Functional Limitations Info  Sight, Hearing, Speech Sight Info: Adequate Hearing Info: Adequate Speech Info: Adequate    SPECIAL CARE FACTORS FREQUENCY  PT (By licensed PT), OT (By licensed OT)     PT Frequency:  (5) OT Frequency:  (5)            Contractures      Additional Factors Info  Code Status, Allergies Code Status Info:  (Full Code) Allergies Info:  ( Levaquin Levofloxacin, Vicodin Hydrocodone-acetaminophen)           Current Medications (01/26/2017):  This is the current hospital active medication list Current Facility-Administered Medications  Medication Dose Route Frequency Provider Last Rate Last Dose  . 0.9 %  sodium chloride infusion   Intravenous Continuous Arnaldo Natal, MD 150 mL/hr at 01/26/17 6468    . acetaminophen (TYLENOL) tablet 650 mg  650 mg Oral Q6H PRN Arnaldo Natal, MD       Or  . acetaminophen (TYLENOL) suppository 650 mg  650 mg Rectal Q6H PRN Arnaldo Natal, MD      . albuterol (PROVENTIL) (2.5 MG/3ML) 0.083% nebulizer solution 2.5 mg  2.5 mg Nebulization Q4H PRN Arnaldo Natal, MD      . ALPRAZolam Prudy Feeler) tablet 0.5 mg  0.5  mg Oral QHS PRN Arnaldo Natal, MD      . aspirin EC tablet 81 mg  81 mg Oral Daily Arnaldo Natal, MD   81 mg at 01/26/17 0859  . chlorthalidone (HYGROTON) tablet 25 mg  25 mg Oral Daily Arnaldo Natal, MD      . cholecalciferol (VITAMIN D) tablet 2,000 Units  2,000 Units Oral Daily Arnaldo Natal, MD   2,000 Units at 01/26/17 [phone]  . citalopram (CELEXA) tablet 20 mg  20 mg Oral QHS Arnaldo Natal, MD      . docusate sodium (COLACE) capsule 100 mg  100 mg Oral BID Arnaldo Natal, MD   100 mg at 01/26/17 0859   . doxepin (SINEQUAN) capsule 25 mg  25 mg Oral QHS Arnaldo Natal, MD      . fluticasone furoate-vilanterol (BREO ELLIPTA) 100-25 MCG/INH 1 puff  1 puff Inhalation Daily Arnaldo Natal, MD      . folic acid (FOLVITE) tablet 1 mg  1 mg Oral Daily Arnaldo Natal, MD   1 mg at 01/26/17 0901  . levothyroxine (SYNTHROID, LEVOTHROID) tablet 50 mcg  50 mcg Oral QAC breakfast Arnaldo Natal, MD   50 mcg at 01/26/17 0803  . lisinopril (PRINIVIL,ZESTRIL) tablet 10 mg  10 mg Oral Daily Arnaldo Natal, MD   10 mg at 01/26/17 0900  . LORazepam (ATIVAN) injection 0-4 mg  0-4 mg Intravenous Q4H Arnaldo Natal, MD       Followed by  . [START ON 01/28/2017] LORazepam (ATIVAN) injection 0-4 mg  0-4 mg Intravenous Q12H Arnaldo Natal, MD      . LORazepam (ATIVAN) tablet 1 mg  1 mg Oral Q6H PRN Arnaldo Natal, MD       Or  . LORazepam (ATIVAN) injection 1 mg  1 mg Intravenous Q6H PRN Arnaldo Natal, MD      . montelukast (SINGULAIR) tablet 10 mg  10 mg Oral Daily Arnaldo Natal, MD   10 mg at 01/26/17 0858  . morphine 2 MG/ML injection 2-4 mg  2-4 mg Intravenous Q4H PRN Arnaldo Natal, MD   2 mg at 01/26/17 9604  . multivitamin with minerals tablet 1 tablet  1 tablet Oral Daily Arnaldo Natal, MD   1 tablet at 01/26/17 [phone]  . omega-3 acid ethyl esters (LOVAZA) capsule 1 g  1 g Oral BID Arnaldo Natal, MD   1 g at 01/26/17 0859  . ondansetron (ZOFRAN) tablet 4 mg  4 mg Oral Q6H PRN Arnaldo Natal, MD       Or  . ondansetron [facility]) injection 4 mg  4 mg Intravenous Q6H PRN Arnaldo Natal, MD      . potassium chloride SA (K-DUR,KLOR-CON) CR tablet 20 mEq  20 mEq Oral Daily Arnaldo Natal, MD   20 mEq at 01/26/17 0859  . simvastatin (ZOCOR) tablet 40 mg  40 mg Oral QHS Arnaldo Natal, MD      . sodium chloride flush (NS) 0.9 % injection 3 mL  3 mL Intravenous Q12H Arnaldo Natal, MD      . thiamine (VITAMIN B-1) tablet 100 mg  100 mg Oral Daily Arnaldo Natal,  MD   100 mg at 01/26/17 8119   Or  . thiamine (B-1) injection 100 mg  100 mg Intravenous Daily Arnaldo Natal, MD      . tiotropium [facility]) inhalation capsule 18 mcg  1 capsule Inhalation Daily Arnaldo Natal, MD         Discharge Medications: Please see discharge summary for a list of discharge medications.  Relevant Imaging Results:  Relevant Lab Results:   Additional Information  (SSN: [id number])  Ralene Bathe, Student-Social Work

## 2017-01-26 NOTE — Progress Notes (Signed)
[place] [facility]                                                                                                                                                                                  Patient Demographics   Lori Brady, is a 71 y.o. female, DOB - 1946/08/03, PET:[id number]  Admit date - 01/26/2017   Admitting Physician Harrie Foreman, MD  Outpatient Primary MD for the patient is Trinna Post, PA-C   LOS - 0  Subjective: Patient admitted with a nondisplaced hip fracture continues to have pain She reports that she only had 2 drinks yesterday because he was mowing. She does not drink on a routine basis.    Review of Systems:   CONSTITUTIONAL: No documented fever. No fatigue, weakness. No weight gain, no weight loss.  EYES: No blurry or double vision.  ENT: No tinnitus. No postnasal drip. No redness of the oropharynx.  RESPIRATORY: No cough, no wheeze, no hemoptysis. No dyspnea.  CARDIOVASCULAR: No chest pain. No orthopnea. No palpitations. No syncope.  GASTROINTESTINAL: No nausea, no vomiting or diarrhea. No abdominal pain. No melena or hematochezia.  GENITOURINARY: No dysuria or hematuria.  ENDOCRINE: No polyuria or nocturia. No heat or cold intolerance.  HEMATOLOGY: No anemia. No bruising. No bleeding.  INTEGUMENTARY: No rashes. No lesions.  MUSCULOSKELETAL: Positive hip pain NEUROLOGIC: No numbness, tingling, or ataxia. No seizure-type activity.  PSYCHIATRIC: No anxiety. No insomnia. No ADD.    Vitals:   Vitals:   01/26/17 0215 01/26/17 0333 01/26/17 0357 01/26/17 0805  BP: (!) 153/91 140/77 (!) 150/74 (!) 157/77  Pulse: (!) 103 98 (!) 101 (!) 106  Resp: (!) 22 20  18   Temp:   98.9 F (37.2 C) 99.5 F (37.5 C)  TempSrc:   Oral Oral  SpO2: 94% 95% 96% 95%  Weight:      Height:        Wt Readings from Last 3 Encounters:  01/26/17 150 lb (68 kg)  11/17/16 151 lb (68.5 kg)  11/12/16 151 lb 12.8 oz (68.9 kg)      Intake/Output Summary (Last 24 hours) at 01/26/17 1248 Last data filed at 01/26/17 1226  Gross per 24 hour  Intake              550 ml  Output              200 ml  Net              350 ml    Physical Exam:   GENERAL: Pleasant-appearing in no apparent distress.  HEAD, EYES, EARS, NOSE AND THROAT: Atraumatic, normocephalic. Extraocular muscles are  intact. Pupils equal and reactive to light. Sclerae anicteric. No conjunctival injection. No oro-pharyngeal erythema.  NECK: Supple. There is no jugular venous distention. No bruits, no lymphadenopathy, no thyromegaly.  HEART: Regular rate and rhythm,. No murmurs, no rubs, no clicks.  LUNGS: Clear to auscultation bilaterally. No rales or rhonchi. No wheezes.  ABDOMEN: Soft, flat, nontender, nondistended. Has good bowel sounds. No hepatosplenomegaly appreciated.  EXTREMITIES: No evidence of any cyanosis, clubbing, or peripheral edema.  +2 pedal and radial pulses bilaterally.  NEUROLOGIC: The patient is alert, awake, and oriented x3 with no focal motor or sensory deficits appreciated bilaterally.  SKIN: Moist and warm with no rashes appreciated.  Psych: Not anxious, depressed LN: No inguinal LN enlargement    Antibiotics   Anti-infectives    None      Medications   Scheduled Meds: . aspirin EC  81 mg Oral Daily  . chlorthalidone  25 mg Oral Daily  . cholecalciferol  2,000 Units Oral Daily  . citalopram  20 mg Oral QHS  . docusate sodium  100 mg Oral BID  . doxepin  25 mg Oral QHS  . fluticasone furoate-vilanterol  1 puff Inhalation Daily  . folic acid  1 mg Oral Daily  . levothyroxine  50 mcg Oral QAC breakfast  . lisinopril  10 mg Oral Daily  . LORazepam  0-4 mg Intravenous Q4H   Followed by  . [START ON 01/28/2017] LORazepam  0-4 mg Intravenous Q12H  . montelukast  10 mg Oral Daily  . multivitamin with minerals  1 tablet Oral Daily  . omega-3 acid ethyl esters  1 g Oral BID  . pantoprazole  40 mg Oral Daily  .  potassium chloride SA  20 mEq Oral Daily  . simvastatin  40 mg Oral QHS  . sodium chloride flush  3 mL Intravenous Q12H  . thiamine  100 mg Oral Daily   Or  . thiamine  100 mg Intravenous Daily  . tiotropium  1 capsule Inhalation Daily   Continuous Infusions: . sodium chloride 150 mL/hr at 01/26/17 0420   PRN Meds:.acetaminophen **OR** acetaminophen, albuterol, ALPRAZolam, LORazepam **OR** LORazepam, morphine injection, ondansetron **OR** ondansetron (ZOFRAN) IV   Data Review:   Micro Results Recent Results (from the past 240 hour(s))  Surgical pcr screen     Status: None   Collection Time: 01/26/17  4:14 AM  Result Value Ref Range Status   MRSA, PCR NEGATIVE NEGATIVE Final   Staphylococcus aureus NEGATIVE NEGATIVE Final    Comment:        The Xpert SA Assay (FDA approved for NASAL specimens in patients over 49 years of age), is one component of a comprehensive surveillance program.  Test performance has been validated by [facility] for patients greater than or equal to 62 year old. It is not intended to diagnose infection nor to guide or monitor treatment.     Radiology Reports Ct Hip Right Wo Contrast  Result Date: 01/26/2017 CLINICAL DATA:  71 year old female with fall and right hip pain. EXAM: CT OF THE RIGHT HIP WITHOUT CONTRAST TECHNIQUE: Multidetector CT imaging of the right hip was performed according to the standard protocol. Multiplanar CT image reconstructions were also generated. COMPARISON:  Right hip radiograph dated 01/26/2017 FINDINGS: Bones/Joint/Cartilage There is a faint linear lucency in the anterior right acetabulum concerning for nondisplaced anterior column fracture. There is a nondisplaced fracture of the left pubic ramus adjacent to the symphysis pubis. No definite other fracture identified, however evaluation for fracture  is very limited due to osteopenia. There is no dislocation. Ligaments Suboptimally assessed by CT. Muscles and Tendons No  intramuscular hematoma or injury. Soft tissues Unremarkable. IMPRESSION: Probable nondisplaced fracture of the anterior column of the right acetabulum as well as nondisplaced fracture of left pubic ramus adjacent to symphysis pubis. Evaluation for fracture is very limited due to osteopenia. MRI may provide better evaluation if there is high clinical concern for femoral fracture. No dislocation. Electronically Signed   By: Anner Crete M.D.   On: 01/26/2017 02:35   Dg Hip Unilat With Pelvis 2-3 Views Right  Result Date: 01/26/2017 CLINICAL DATA:  Right hip pain after a fall. EXAM: DG HIP (WITH OR WITHOUT PELVIS) 2-3V RIGHT COMPARISON:  None. FINDINGS: Mild degenerative changes in the right hip with narrowing and sclerosis of the superior acetabular joint and osteophytes predominantly on the acetabular surface. No evidence of acute fracture or dislocation in the right hip. Pelvis appears intact. SI joints and symphysis pubis are not displaced. Can't exclude sclerosis in the SI joints although overlying bowel gas limits evaluation. No focal bone lesion or bone destruction. Soft tissues are unremarkable. IMPRESSION: Mild degenerative changes in the right hip. No acute displaced fractures identified. Electronically Signed   By: Lucienne Capers M.D.   On: 01/26/2017 00:35     CBC  Recent Labs Lab 01/26/17 0008  WBC 9.3  HGB 12.8  HCT 36.9  PLT 143*  MCV 90.4  MCH 31.2  MCHC 34.5  RDW 12.4  LYMPHSABS 2.5  MONOABS 0.9  EOSABS 0.5  BASOSABS 0.0    Chemistries   Recent Labs Lab 01/26/17 0008  NA 135  K 3.9  CL 103  CO2 24  GLUCOSE 118*  BUN 8  CREATININE 0.64  CALCIUM 8.7*  AST 192*  ALT 142*  ALKPHOS 116  BILITOT 1.0   ------------------------------------------------------------------------------------------------------------------ estimated creatinine clearance is 62.3 mL/min (by C-G formula based on SCr of 0.64  mg/dL). ------------------------------------------------------------------------------------------------------------------ No results for input(s): HGBA1C in the last 72 hours. ------------------------------------------------------------------------------------------------------------------ No results for input(s): CHOL, HDL, LDLCALC, TRIG, CHOLHDL, LDLDIRECT in the last 72 hours. ------------------------------------------------------------------------------------------------------------------  Recent Labs  01/26/17 0008  TSH 6.432*   ------------------------------------------------------------------------------------------------------------------ No results for input(s): VITAMINB12, FOLATE, FERRITIN, TIBC, IRON, RETICCTPCT in the last 72 hours.  Coagulation profile  Recent Labs Lab 01/26/17 0008  INR 1.13    No results for input(s): DDIMER in the last 72 hours.  Cardiac Enzymes No results for input(s): CKMB, TROPONINI, MYOGLOBIN in the last 168 hours.  Invalid input(s): CK ------------------------------------------------------------------------------------------------------------------ Invalid input(s): [place]   This is a 71 year old female admitted for right hip fracture. 1. Hip fracture No orthopedic consult has been placed I will order one We'll continue bed rest Pain control  2. Alcohol use patient denies abuse she does report that she drinks one or 2 drinks every few days but not on daily basis monitor for any withdrawal symptoms. 3. COPD: The patient has a chronic cough. She does not have increased sputum production or hypoxia. Continue inhalers per home regimen will add when necessary nebulizers as needed 4. CAD: Stable; continue aspirin 5. Hypertension: Relatively controlled; continue chlorthalidone and lisinopril 6. Hypothyroidism: Continue Synthroid.  7. Hyperlipidemia: Continue statin therapy 8. DVT prophylaxis: SCDs 9. GI prophylaxis:  None       Code Status Orders        Start     Ordered   01/26/17 0406  Full code  Continuous  01/26/17 0405    Code Status History    Date Active Date Inactive Code Status Order ID Comments User Context   10/08/2016  3:56 PM 10/13/2016  6:14 PM Full Code [id number]  Nicholes Mango, MD Inpatient           Consults Orthopedics DVT Prophylaxis SCDs  Lab Results  Component Value Date   PLT 143 (L) 01/26/2017     Time Spent in minutes   42mn  Greater than 50% of time spent in care coordination and counseling patient regarding the condition and plan of care.   PDustin FlockM.D on 01/26/2017 at 12:48 PM  Between 7am to 6pm - Pager - [phone]  After 6pm go to [email or web address] - password [email or web address]   Office  [phone]

## 2017-01-26 NOTE — ED Provider Notes (Signed)
[facility] Emergency Department Provider Note ____________________________________________  Time seen: Approximately 1:53 AM  I have reviewed the triage vital signs and the nursing notes.   HISTORY  Chief Complaint Fall (Pt. here via EMS from home)    HPI Lori Brady is a 71 y.o. female who presents to the emergency department for evaluation of right hip pain.She states she bent over to pick something up off the floor and lost her balance. She has pain in her right hip with deformity per EMS. Family report that they helped her up out of the floor and assisted her to the bedroom, but she was unable to ambulate without help. She reports having had "2-3 drinks" earlier.  Past Medical History:  Diagnosis Date  . Arthritis   . Asthma   . Crohn's disease (HCC)   . Emphysema of lung (HCC)   . GERD (gastroesophageal reflux disease)   . History of chicken pox   . History of heart attack   . Hx of completed stroke   . Hypercholesteremia   . Hypertension   . Pneumonia   . Seasonal allergies   . Sepsis (HCC)   . UTI (lower urinary tract infection)     Patient Active Problem List   Diagnosis Date Noted  . Septic shock (HCC) 10/08/2016  . COPD (chronic obstructive pulmonary disease) with chronic bronchitis (HCC) 08/12/2016  . Asthma without status asthmaticus 03/31/2016  . Osteoporosis 03/31/2016  . Elevated LFTs 09/17/2015  . Parotid swelling 09/13/2015  . COPD exacerbation (HCC) 09/13/2015  . Shortness of breath 09/13/2015  . Urticaria 09/13/2015  . Cerebrovascular accident (CVA) (HCC) 08/21/2015  . Senile purpura (HCC) 08/21/2015  . Atrial tachycardia (HCC) 08/13/2015  . Eczema 07/10/2015  . Allergic rhinitis 06/26/2015  . Hyperkalemia 05/03/2015  . Anxiety 04/23/2015  . Edema leg 03/27/2015  . Fatigue 03/06/2015  . Primary cardiomyopathy (HCC) 03/05/2015  . Benign essential HTN 02/28/2015  . Combined fat and carbohydrate induced hyperlipemia  09/06/2014  . COPD, severe (HCC) 08/01/2014  . Acid reflux 08/01/2014  . Cough 08/01/2014    Past Surgical History:  Procedure Laterality Date  . BREAST BIOPSY Bilateral 1960's to 90's    Prior to Admission medications   Medication Sig Start Date End Date Taking? Authorizing Provider  albuterol (ACCUNEB) 0.63 MG/3ML nebulizer solution Take 1 ampule by nebulization every 6 (six) hours as needed for wheezing.   Yes Historical Provider, MD  albuterol (PROVENTIL HFA;VENTOLIN HFA) 108 (90 Base) MCG/ACT inhaler Inhale 2 puffs into the lungs every 4 (four) hours as needed for wheezing or shortness of breath.   Yes Historical Provider, MD  alendronate (FOSAMAX) 70 MG tablet Take 1 tablet (70 mg total) by mouth once a week. 07/15/16  Yes Malva Limes, MD  ALPRAZolam Prudy Feeler) 0.5 MG tablet Take 0.5 mg by mouth at bedtime as needed for anxiety.   Yes Historical Provider, MD  aspirin 81 MG tablet Take 81 mg by mouth daily.   Yes Historical Provider, MD  chlorthalidone (HYGROTON) 25 MG tablet Take 1 tablet (25 mg total) by mouth daily. 10/19/16  Yes Margaretann Loveless, PA-C  Cholecalciferol (VITAMIN D) 2000 UNITS CAPS Take 1 capsule by mouth daily.   Yes Historical Provider, MD  citalopram (CELEXA) 20 MG tablet Take 1 tablet (20 mg total) by mouth daily. Patient taking differently: Take 20 mg by mouth at bedtime.  11/17/16  Yes Trey Sailors, PA-C  doxepin (SINEQUAN) 25 MG capsule Take 25 mg  by mouth at bedtime.  01/06/16  Yes Historical Provider, MD  Fluticasone Furoate-Vilanterol (BREO ELLIPTA) 100-25 MCG/INH AEPB Inhale 1 puff into the lungs daily.    Yes Historical Provider, MD  hydrOXYzine (ATARAX/VISTARIL) 10 MG tablet Take 10-20 mg by mouth at bedtime.   Yes Historical Provider, MD  lisinopril (PRINIVIL,ZESTRIL) 10 MG tablet Take 10 mg by mouth daily.  01/01/16  Yes Historical Provider, MD  LORazepam (ATIVAN) 0.5 MG tablet Take 0.5 mg by mouth every 8 (eight) hours.    Yes Historical Provider, MD   montelukast (SINGULAIR) 10 MG tablet Take 1 tablet (10 mg total) by mouth daily. 12/11/16  Yes Trey Sailors, PA-C  Omega-3 Fatty Acids (FISH OIL) 1000 MG CAPS Take 1 capsule by mouth daily.   Yes Historical Provider, MD  potassium chloride SA (K-DUR,KLOR-CON) 20 MEQ tablet Take 20 mEq by mouth daily.   Yes Historical Provider, MD  simvastatin (ZOCOR) 40 MG tablet TAKE 1 TABLET BY MOUTH AT BEDTIME. 01/01/16  Yes [name], MD  Tiotropium Bromide Monohydrate (SPIRIVA RESPIMAT) 2.5 MCG/ACT AERS Inhale 2 puffs into the lungs daily.   Yes Historical Provider, MD  betamethasone valerate (VALISONE) 0.1 % cream APPLY TOPICALLY TO AFFECTED AREA SPARINGLY 2 TIMES DAILY. 07/10/15   [name], MD    Allergies Levaquin [levofloxacin] and Vicodin [hydrocodone-acetaminophen]  Family History  Problem Relation Age of Onset  . Cancer Sister     ovarian/uterine/breast  . Breast cancer Sister 18  . Cancer Sister     breast  . Heart disease Sister   . Breast cancer Sister 72  . Heart disease Father   . Cancer Father     liver  . Cancer Mother   . Heart disease Brother   . Cancer Brother     liver  . Heart disease Sister   . Diabetes Maternal Grandmother     Social History Social History  Substance Use Topics  . Smoking status: Former Smoker    Packs/day: 1.00    Years: 30.00    Types: Cigarettes    Quit date: 08/01/2008  . Smokeless tobacco: Never Used  . Alcohol use Yes     Comment: occasional    Review of Systems Constitutional: No recent illness. Cardiovascular: Denies chest pain or palpitations. Respiratory: Denies shortness of breath. Musculoskeletal: Pain in right hip. Skin: Negative for rash, wound, lesion. Neurological: Negative for focal weakness or numbness.  ____________________________________________   PHYSICAL EXAM:  VITAL SIGNS: ED Triage Vitals  Enc Vitals Group     BP 01/26/17 0010 (!) 170/90     Pulse Rate 01/26/17 0010 (!) 101     Resp 01/26/17  0010 (!) 23     Temp 01/26/17 0010 98.2 F (36.8 C)     Temp Source 01/26/17 0010 Oral     SpO2 01/26/17 0004 100 %     Weight 01/26/17 0010 150 lb (68 kg)     Height 01/26/17 0010 5\' 3"  (1.6 m)     Head Circumference --      Peak Flow --      Pain Score 01/26/17 0010 1     Pain Loc --      Pain Edu? --      Excl. in GC? --     Constitutional: Alert and oriented. Well appearing and in no acute distress. Eyes: Conjunctivae are normal. EOMI. Head: Atraumatic. Neck: No stridor.  Respiratory: Normal respiratory effort.   Musculoskeletal: Nexus criteria is negative. Focal tenderness of  the right anterior and lateral hip. Pulse, motor, and sensory intact. Neurologic:  Normal speech and language. No gross focal neurologic deficits are appreciated. Speech is normal. No gait instability. Skin:  Skin is warm, dry and intact. Atraumatic. Psychiatric: Mood and affect are normal. Speech and behavior are normal.  ____________________________________________   LABS (all labs ordered are listed, but only abnormal results are displayed)  Labs Reviewed  COMPREHENSIVE METABOLIC PANEL - Abnormal; Notable for the following:       Result Value   Glucose, Bld 118 (*)    Calcium 8.7 (*)    AST 192 (*)    ALT 142 (*)    All other components within normal limits  ETHANOL - Abnormal; Notable for the following:    Alcohol, Ethyl (B) 173 (*)    All other components within normal limits  CBC WITH DIFFERENTIAL/PLATELET - Abnormal; Notable for the following:    Platelets 143 (*)    All other components within normal limits  PROTIME-INR   ____________________________________________  RADIOLOGY  Plain film of right hip is negative for acute abnormality per radiology.  CT right hip: IMPRESSION: Probable nondisplaced fracture of the anterior column of the right acetabulum as well as nondisplaced fracture of left pubic ramus adjacent to symphysis pubis. Evaluation for fracture is very limited due  to osteopenia. MRI may provide better evaluation if there is high clinical concern for femoral fracture. No dislocation ____________________________________________   PROCEDURES  Procedure(s) performed: None  ____________________________________________   INITIAL IMPRESSION / ASSESSMENT AND PLAN / ED COURSE  71 year old female presenting to the emergency department for evaluation of right hip pain post mechanical, nonsyncopal fall prior to arrival. Will evaluate with labs and x-rays.  0050: Reassessment after negative x-ray. Increase in right hip pain with internal rotation of the right hip with the knee flexed. Will further assess with CT.  0246: Patient to be admitted to  hospitalist services and orthopedic consult.  Pertinent labs & imaging results that were available during my care of the patient were reviewed by me and considered in my medical decision making (see chart for details).  _________________________________________   FINAL CLINICAL IMPRESSION(S) / ED DIAGNOSES  Final diagnoses:  Acute pain due to injury  Closed fracture of anterior lip of right acetabulum without additional fracture (HCC)  Pubic ramus fracture, left, closed, initial encounter [facility])    New Prescriptions   No medications on file    If controlled substance prescribed during this visit, 12 month history viewed on the [facility] prior to issuing an initial prescription for Schedule II or III opiod.    Chinita Pester, FNP 01/29/17 1309    Darci Current, MD 02/03/17 [phone]

## 2017-01-26 NOTE — Plan of Care (Signed)
Problem: Pain Management: Goal: Pain level will decrease Outcome: Progressing Patient states that she is having more comfort with repositioning and pain medication is helping.  Harvie Heck, RN

## 2017-01-26 NOTE — ED Notes (Signed)
Patient transported to X-ray 

## 2017-01-27 LAB — COMPREHENSIVE METABOLIC PANEL
ALBUMIN: 3.6 g/dL (ref 3.5–5.0)
ALT: 83 U/L — ABNORMAL HIGH (ref 14–54)
ANION GAP: 8 (ref 5–15)
AST: 79 U/L — ABNORMAL HIGH (ref 15–41)
Alkaline Phosphatase: 92 U/L (ref 38–126)
BILIRUBIN TOTAL: 2.2 mg/dL — AB (ref 0.3–1.2)
BUN: 8 mg/dL (ref 6–20)
CHLORIDE: 99 mmol/L — AB (ref 101–111)
CO2: 25 mmol/L (ref 22–32)
Calcium: 8.4 mg/dL — ABNORMAL LOW (ref 8.9–10.3)
Creatinine, Ser: 0.54 mg/dL (ref 0.44–1.00)
GFR calc Af Amer: >60 mL/min (ref >60)
GFR calc non Af Amer: >60 mL/min (ref >60)
GLUCOSE: 113 mg/dL — AB (ref 65–99)
POTASSIUM: 3.3 mmol/L — AB (ref 3.5–5.1)
SODIUM: 132 mmol/L — AB (ref 135–145)
TOTAL PROTEIN: 6.7 g/dL (ref 6.5–8.1)

## 2017-01-27 LAB — HEMOGLOBIN A1C
Hgb A1c MFr Bld: 5.3 % (ref 4.8–5.6)
Mean Plasma Glucose: 105 mg/dL

## 2017-01-27 MED ORDER — DIPHENHYDRAMINE HCL 25 MG PO CAPS
25.0000 mg | ORAL_CAPSULE | Freq: Every evening | ORAL | Status: DC | PRN
  Administered 2017-01-27 – 2017-01-28 (×3): 25 mg via ORAL
  Filled 2017-01-27 (×3): qty 1

## 2017-01-27 MED ORDER — POTASSIUM CHLORIDE 20 MEQ PO PACK
40.0000 meq | PACK | Freq: Once | ORAL | Status: AC
  Administered 2017-01-27: 40 meq via ORAL
  Filled 2017-01-27: qty 2

## 2017-01-27 MED ORDER — OXYCODONE-ACETAMINOPHEN 5-325 MG PO TABS
1.0000 | ORAL_TABLET | Freq: Four times a day (QID) | ORAL | Status: DC | PRN
  Administered 2017-01-27 – 2017-01-28 (×2): 1 via ORAL
  Filled 2017-01-27 (×3): qty 1

## 2017-01-27 MED ORDER — LISINOPRIL 20 MG PO TABS
20.0000 mg | ORAL_TABLET | Freq: Every day | ORAL | Status: DC
  Administered 2017-01-28: 20 mg via ORAL
  Filled 2017-01-27: qty 1

## 2017-01-27 MED ORDER — HYDRALAZINE HCL 20 MG/ML IJ SOLN: 10.0000 mg | Freq: Four times a day (QID) | INTRAMUSCULAR | Status: DC | PRN

## 2017-01-27 NOTE — Progress Notes (Signed)
[facility]                                                                                                                                                                                  Patient Demographics   Lori Brady, is a 71 y.o. female, DOB - [birth year], XYI:[id number]  Admit date - 01/26/2017   Admitting Physician Harrie Foreman, MD  Outpatient Primary MD for the patient is Trinna Post, PA-C   LOS - 1  Subjective: Patient reports her pain is better.   Review of Systems:   CONSTITUTIONAL: No documented fever. No fatigue, weakness. No weight gain, no weight loss.  EYES: No blurry or double vision.  ENT: No tinnitus. No postnasal drip. No redness of the oropharynx.  RESPIRATORY: No cough, no wheeze, no hemoptysis. No dyspnea.  CARDIOVASCULAR: No chest pain. No orthopnea. No palpitations. No syncope.  GASTROINTESTINAL: No nausea, no vomiting or diarrhea. No abdominal pain. No melena or hematochezia.  GENITOURINARY: No dysuria or hematuria.  ENDOCRINE: No polyuria or nocturia. No heat or cold intolerance.  HEMATOLOGY: No anemia. No bruising. No bleeding.  INTEGUMENTARY: No rashes. No lesions.  MUSCULOSKELETAL: Positive hip pain NEUROLOGIC: No numbness, tingling, or ataxia. No seizure-type activity.  PSYCHIATRIC: No anxiety. No insomnia. No ADD.    Vitals:   Vitals:   01/26/17 1929 01/27/17 0156 01/27/17 0451 01/27/17 0723  BP: (!) 192/98 (!) 192/98 (!) 172/88 (!) 189/91  Pulse: (!) 102 99 74 91  Resp: 16 18 18 18   Temp: 99.1 F (37.3 C) 98.2 F (36.8 C) 98.7 F (37.1 C) 98.5 F (36.9 C)  TempSrc: Oral Oral Oral Oral  SpO2: 95% 92% 95% 96%  Weight:   164 lb 14.4 oz (74.8 kg)   Height:        Wt Readings from Last 3 Encounters:  01/27/17 164 lb 14.4 oz (74.8 kg)  11/17/16 151 lb (68.5 kg)  11/12/16 151 lb 12.8 oz (68.9 kg)     Intake/Output Summary (Last 24 hours) at 01/27/17 1253 Last data filed at 01/27/17  1009  Gross per 24 hour  Intake           3022.5 ml  Output                0 ml  Net           3022.5 ml    Physical Exam:   GENERAL: Pleasant-appearing in no apparent distress.  HEAD, EYES, EARS, NOSE AND THROAT: Atraumatic, normocephalic. Extraocular muscles are intact. Pupils equal and reactive to light. Sclerae anicteric. No conjunctival injection. No oro-pharyngeal erythema.  NECK: Supple. There is  no jugular venous distention. No bruits, no lymphadenopathy, no thyromegaly.  HEART: Regular rate and rhythm,. No murmurs, no rubs, no clicks.  LUNGS: Clear to auscultation bilaterally. No rales or rhonchi. No wheezes.  ABDOMEN: Soft, flat, nontender, nondistended. Has good bowel sounds. No hepatosplenomegaly appreciated.  EXTREMITIES: No evidence of any cyanosis, clubbing, or peripheral edema.  +2 pedal and radial pulses bilaterally.  NEUROLOGIC: The patient is alert, awake, and oriented x3 with no focal motor or sensory deficits appreciated bilaterally.  SKIN: Moist and warm with no rashes appreciated.  Psych: Not anxious, depressed LN: No inguinal LN enlargement    Antibiotics   Anti-infectives    None      Medications   Scheduled Meds: . aspirin EC  81 mg Oral Daily  . chlorthalidone  25 mg Oral Daily  . cholecalciferol  2,000 Units Oral Daily  . citalopram  20 mg Oral QHS  . docusate sodium  100 mg Oral BID  . doxepin  25 mg Oral QHS  . enoxaparin (LOVENOX) injection  40 mg Subcutaneous Q24H  . fluticasone furoate-vilanterol  1 puff Inhalation Daily  . folic acid  1 mg Oral Daily  . levothyroxine  50 mcg Oral QAC breakfast  . lisinopril  10 mg Oral Daily  . montelukast  10 mg Oral Daily  . multivitamin with minerals  1 tablet Oral Daily  . omega-3 acid ethyl esters  1 g Oral BID  . pantoprazole  40 mg Oral Daily  . potassium chloride  40 mEq Oral Once  . potassium chloride SA  20 mEq Oral Daily  . simvastatin  40 mg Oral QHS  . sodium chloride flush  3 mL  Intravenous Q12H  . thiamine  100 mg Oral Daily   Or  . thiamine  100 mg Intravenous Daily  . tiotropium  1 capsule Inhalation Daily   Continuous Infusions:  PRN Meds:.acetaminophen **OR** acetaminophen, albuterol, ALPRAZolam, diphenhydrAMINE, HYDROmorphone (DILAUDID) injection, ondansetron **OR** ondansetron (ZOFRAN) IV   Data Review:   Micro Results Recent Results (from the past 240 hour(s))  Surgical pcr screen     Status: None   Collection Time: 01/26/17  4:14 AM  Result Value Ref Range Status   MRSA, PCR NEGATIVE NEGATIVE Final   Staphylococcus aureus NEGATIVE NEGATIVE Final    Comment:        The Xpert SA Assay (FDA approved for NASAL specimens in patients over 60 years of age), is one component of a comprehensive surveillance program.  Test performance has been validated by [facility] for patients greater than or equal to 73 year old. It is not intended to diagnose infection nor to guide or monitor treatment.     Radiology Reports Ct Hip Right Wo Contrast  Result Date: 01/26/2017 CLINICAL DATA:  71 year old female with fall and right hip pain. EXAM: CT OF THE RIGHT HIP WITHOUT CONTRAST TECHNIQUE: Multidetector CT imaging of the right hip was performed according to the standard protocol. Multiplanar CT image reconstructions were also generated. COMPARISON:  Right hip radiograph dated 01/26/2017 FINDINGS: Bones/Joint/Cartilage There is a faint linear lucency in the anterior right acetabulum concerning for nondisplaced anterior column fracture. There is a nondisplaced fracture of the left pubic ramus adjacent to the symphysis pubis. No definite other fracture identified, however evaluation for fracture is very limited due to osteopenia. There is no dislocation. Ligaments Suboptimally assessed by CT. Muscles and Tendons No intramuscular hematoma or injury. Soft tissues Unremarkable. IMPRESSION: Probable nondisplaced fracture of the anterior column  of the right acetabulum as  well as nondisplaced fracture of left pubic ramus adjacent to symphysis pubis. Evaluation for fracture is very limited due to osteopenia. MRI may provide better evaluation if there is high clinical concern for femoral fracture. No dislocation. Electronically Signed   By: Anner Crete M.D.   On: 01/26/2017 02:35   Dg Hip Unilat With Pelvis 2-3 Views Right  Result Date: 01/26/2017 CLINICAL DATA:  Right hip pain after a fall. EXAM: DG HIP (WITH OR WITHOUT PELVIS) 2-3V RIGHT COMPARISON:  None. FINDINGS: Mild degenerative changes in the right hip with narrowing and sclerosis of the superior acetabular joint and osteophytes predominantly on the acetabular surface. No evidence of acute fracture or dislocation in the right hip. Pelvis appears intact. SI joints and symphysis pubis are not displaced. Can't exclude sclerosis in the SI joints although overlying bowel gas limits evaluation. No focal bone lesion or bone destruction. Soft tissues are unremarkable. IMPRESSION: Mild degenerative changes in the right hip. No acute displaced fractures identified. Electronically Signed   By: Lucienne Capers M.D.   On: 01/26/2017 00:35     CBC  Recent Labs Lab 01/26/17 0008  WBC 9.3  HGB 12.8  HCT 36.9  PLT 143*  MCV 90.4  MCH 31.2  MCHC 34.5  RDW 12.4  LYMPHSABS 2.5  MONOABS 0.9  EOSABS 0.5  BASOSABS 0.0    Chemistries   Recent Labs Lab 01/26/17 0008 01/27/17 0523  NA 135 132*  K 3.9 3.3*  CL 103 99*  CO2 24 25  GLUCOSE 118* 113*  BUN 8 8  CREATININE 0.64 0.54  CALCIUM 8.7* 8.4*  AST 192* 79*  ALT 142* 83*  ALKPHOS 116 92  BILITOT 1.0 2.2*   ------------------------------------------------------------------------------------------------------------------ estimated creatinine clearance is 65.2 mL/min (by C-G formula based on SCr of 0.54 mg/dL). ------------------------------------------------------------------------------------------------------------------  Recent Labs   01/26/17 0008  HGBA1C 5.3   ------------------------------------------------------------------------------------------------------------------ No results for input(s): CHOL, HDL, LDLCALC, TRIG, CHOLHDL, LDLDIRECT in the last 72 hours. ------------------------------------------------------------------------------------------------------------------  Recent Labs  01/26/17 0008  TSH 6.432*   ------------------------------------------------------------------------------------------------------------------ No results for input(s): VITAMINB12, FOLATE, FERRITIN, TIBC, IRON, RETICCTPCT in the last 72 hours.  Coagulation profile  Recent Labs Lab 01/26/17 0008  INR 1.13    No results for input(s): DDIMER in the last 72 hours.  Cardiac Enzymes No results for input(s): CKMB, TROPONINI, MYOGLOBIN in the last 168 hours.  Invalid input(s): CK ------------------------------------------------------------------------------------------------------------------ Invalid input(s): [place]   This is a 71 year old female admitted for right hip fracture. 1. Hip fracture Nonoperative management recommended Partial weightbearing recommended PT evaluation 2. Alcohol use patient denies abuse stop CIWA protocol 3. COPD: Continue inhalers per home regimen will add when necessary nebulizers as needed 4. CAD: Stable; continue aspirin 5. Hypertension:  Elevated use prn hydralizne continue home meds 6. Hypothyroidism: Continue Synthroid.  7. Hyperlipidemia: Continue statin therapy 8. DVT prophylaxis: SCDs 9. GI prophylaxis: None       Code Status Orders        Start     Ordered   01/26/17 0406  Full code  Continuous     01/26/17 0405    Code Status History    Date Active Date Inactive Code Status Order ID Comments User Context   10/08/2016  3:56 PM 10/13/2016  6:14 PM Full Code [id number]  Nicholes Mango, MD Inpatient           Consults Orthopedics DVT  Prophylaxis SCDs  Lab Results  Component Value Date   PLT 143 (L) 01/26/2017     Time Spent in minutes   51mn  Greater than 50% of time spent in care coordination and counseling patient regarding the condition and plan of care.   PDustin FlockM.D on 01/27/2017 at 12:53 PM  Between 7am to 6pm - Pager - [phone]  After 6pm go to [email or web address] - password [email or web address]   Office  [phone]

## 2017-01-27 NOTE — Progress Notes (Signed)
Subjective:  Patient states that her pain is much improved today. She is seen lying in her hospital bed. She states she has not yet received physical therapy.   Objective:   VITALS:   Vitals:   01/26/17 1929 01/27/17 0156 01/27/17 0451 01/27/17 0723  BP: (!) 192/98 (!) 192/98 (!) 172/88 (!) 189/91  Pulse: (!) 102 99 74 91  Resp: 16 18 18 18   Temp: 99.1 F (37.3 C) 98.2 F (36.8 C) 98.7 F (37.1 C) 98.5 F (36.9 C)  TempSrc: Oral Oral Oral Oral  SpO2: 95% 92% 95% 96%  Weight:   74.8 kg (164 lb 14.4 oz)   Height:        PHYSICAL EXAM:  Bilateral lower services: Skin overlying both hips and pelvis is intact. There is no erythema or ecchymosis or swelling. Her thigh compartments are soft and compressible. Distally she remains neurovascular intact. She is motor function. Patient is able to flex today her knee and hip to approximately 90 and has no pain with internal or external rotation of either hip.   LABS  Results for orders placed or performed during the hospital encounter of 01/26/17 (from the past 24 hour(s))  Comprehensive metabolic panel     Status: Abnormal   Collection Time: 01/27/17  5:23 AM  Result Value Ref Range   Sodium 132 (L) 135 - 145 mmol/L   Potassium 3.3 (L) 3.5 - 5.1 mmol/L   Chloride 99 (L) 101 - 111 mmol/L   CO2 25 22 - 32 mmol/L   Glucose, Bld 113 (H) 65 - 99 mg/dL   BUN 8 6 - 20 mg/dL   Creatinine, Ser 0.54 0.44 - 1.00 mg/dL   Calcium 8.4 (L) 8.9 - 10.3 mg/dL   Total Protein 6.7 6.5 - 8.1 g/dL   Albumin 3.6 3.5 - 5.0 g/dL   AST 79 (H) 15 - 41 U/L   ALT 83 (H) 14 - 54 U/L   Alkaline Phosphatase 92 38 - 126 U/L   Total Bilirubin 2.2 (H) 0.3 - 1.2 mg/dL   GFR calc non Af Amer >60 >60 mL/min   GFR calc Af Amer >60 >60 mL/min   Anion gap 8 5 - 15    Ct Hip Right Wo Contrast  Result Date: 01/26/2017 CLINICAL DATA:  71 year old female with fall and right hip pain. EXAM: CT OF THE RIGHT HIP WITHOUT CONTRAST TECHNIQUE: Multidetector CT imaging of  the right hip was performed according to the standard protocol. Multiplanar CT image reconstructions were also generated. COMPARISON:  Right hip radiograph dated 01/26/2017 FINDINGS: Bones/Joint/Cartilage There is a faint linear lucency in the anterior right acetabulum concerning for nondisplaced anterior column fracture. There is a nondisplaced fracture of the left pubic ramus adjacent to the symphysis pubis. No definite other fracture identified, however evaluation for fracture is very limited due to osteopenia. There is no dislocation. Ligaments Suboptimally assessed by CT. Muscles and Tendons No intramuscular hematoma or injury. Soft tissues Unremarkable. IMPRESSION: Probable nondisplaced fracture of the anterior column of the right acetabulum as well as nondisplaced fracture of left pubic ramus adjacent to symphysis pubis. Evaluation for fracture is very limited due to osteopenia. MRI may provide better evaluation if there is high clinical concern for femoral fracture. No dislocation. Electronically Signed   By: Anner Crete M.D.   On: 01/26/2017 02:35   Dg Hip Unilat With Pelvis 2-3 Views Right  Result Date: 01/26/2017 CLINICAL DATA:  Right hip pain after a fall. EXAM: DG HIP (WITH  OR WITHOUT PELVIS) 2-3V RIGHT COMPARISON:  None. FINDINGS: Mild degenerative changes in the right hip with narrowing and sclerosis of the superior acetabular joint and osteophytes predominantly on the acetabular surface. No evidence of acute fracture or dislocation in the right hip. Pelvis appears intact. SI joints and symphysis pubis are not displaced. Can't exclude sclerosis in the SI joints although overlying bowel gas limits evaluation. No focal bone lesion or bone destruction. Soft tissues are unremarkable. IMPRESSION: Mild degenerative changes in the right hip. No acute displaced fractures identified. Electronically Signed   By: Lucienne Capers M.D.   On: 01/26/2017 00:35    Assessment/Plan:     Active  Problems:   Hip fracture (HCC)  Patient's pain is improved today. I placed the order for physical therapy evaluation. New current pain management. Patient is 25% weightbearing on the right lower extremity given her acetabular fracture on that side. She may be weightbearing as tolerated on the left lower extremity with a superior ramus fracture on the left side.    Thornton Park , MD 01/27/2017, 1:52 PM

## 2017-01-27 NOTE — Progress Notes (Signed)
Spoke to Dr. Retta Mac regarding pain meds for patient. Only has iv hydromorphone ordered and patient has been having some emesis. He had me order percocet 5/325 tabs q6 PRN. Patient has had some nausea from Norco in past but patient said that anti nausea medication worked well along with med.

## 2017-01-27 NOTE — Care Management Note (Signed)
Case Management Note  Patient Details  Name: Lori Brady MRN: [id number] Date of Birth: 1946-11-04  Subjective/Objective:                  Met with patient to discuss discharge planning. She wishes to go to [facility] for rehab however PT evaluation needed. She states she lives with her son and her sister. She is independent from home where she was able to drive. She has no DME for ambulation. She uses [facility] for medications. PCP is with [facility].  Action/Plan: List of home health agencies left with patient for review.   Expected Discharge Date:  01/29/17               Expected Discharge Plan:     In-House Referral:     Discharge planning Services  CM Consult  Post Acute Care Choice:  Durable Medical Equipment, Home Health Choice offered to:  Patient  DME Arranged:    DME Agency:     HH Arranged:    [place] Agency:     Status of Service:  In process, will continue to follow  If discussed at Long Length of Stay Meetings, dates discussed:    Additional Comments:  Marshell Garfinkel, RN 01/27/2017, 11:37 AM

## 2017-01-28 LAB — BASIC METABOLIC PANEL
Anion gap: 9 (ref 5–15)
BUN: 21 mg/dL — ABNORMAL HIGH (ref 6–20)
CALCIUM: 8.4 mg/dL — AB (ref 8.9–10.3)
CHLORIDE: 98 mmol/L — AB (ref 101–111)
CO2: 24 mmol/L (ref 22–32)
Creatinine, Ser: 0.98 mg/dL (ref 0.44–1.00)
GFR calc non Af Amer: 58 mL/min — ABNORMAL LOW (ref >60)
GLUCOSE: 103 mg/dL — AB (ref 65–99)
Potassium: 4.1 mmol/L (ref 3.5–5.1)
Sodium: 131 mmol/L — ABNORMAL LOW (ref 135–145)

## 2017-01-28 MED ORDER — OXYCODONE-ACETAMINOPHEN 5-325 MG PO TABS
1.0000 | ORAL_TABLET | ORAL | Status: DC | PRN
  Administered 2017-01-28: 2 via ORAL
  Administered 2017-01-28 (×2): 1 via ORAL
  Administered 2017-01-29 (×3): 2 via ORAL
  Filled 2017-01-28 (×3): qty 2
  Filled 2017-01-28: qty 1
  Filled 2017-01-28: qty 2
  Filled 2017-01-28: qty 1
  Filled 2017-01-28: qty 2

## 2017-01-28 MED ORDER — ALBUTEROL SULFATE (2.5 MG/3ML) 0.083% IN NEBU
2.5000 mg | INHALATION_SOLUTION | Freq: Four times a day (QID) | RESPIRATORY_TRACT | Status: DC
Start: 2017-01-28 — End: 2017-01-29
  Administered 2017-01-28 – 2017-01-29 (×5): 2.5 mg via RESPIRATORY_TRACT
  Filled 2017-01-28 (×5): qty 3

## 2017-01-28 MED ORDER — FUROSEMIDE 10 MG/ML IJ SOLN
20.0000 mg | Freq: Once | INTRAMUSCULAR | Status: AC
  Administered 2017-01-28: 20 mg via INTRAVENOUS
  Filled 2017-01-28: qty 2

## 2017-01-28 MED ORDER — IBUPROFEN 400 MG PO TABS
400.0000 mg | ORAL_TABLET | Freq: Three times a day (TID) | ORAL | Status: AC
  Administered 2017-01-28 (×3): 400 mg via ORAL
  Filled 2017-01-28 (×3): qty 1

## 2017-01-28 NOTE — Evaluation (Signed)
Physical Therapy Evaluation Patient Details Name: Lori Brady MRN: [id number] DOB: 1946-04-25 Today's Date: 01/28/2017   History of Present Illness  71 yo female admitted to [facility] w/ a fall and R hip pain, imaging determined a nondisplaced fracture of the R acetabulum and L superior pubic rami. PMH includes; COPD, arthritis, asthma, Chron's disease, Emphyseyma, heart attack, stroke, HTN and frequent UTIs    Clinical Impression  Pt awake and willing to participate in PT evaluation, pain was 2/10 at rest but increases with mobility and WBing. Pt was on 2 L/min flow of O2 and O2sat stayed between 90-93% throughout session. Pt is severely limited in safe functional mobility secondary to increased pain, weakness and cardiopulmonary complications, she requires mod assist for functional tasks and use of a RW for improved balance and safety. Pt was understanding of WBing precautions of the R LE (25%) and L LE (WBAT). She was able to ambulate a very short distance to the chair w/ a RW and mod assistance due to decreased activity tolerance, heart rate increased to 120's-130's during activity with increased RR. HR returned to low 100's and breathing improved after sitting in chair for a minute. Overall patient is limited in safe functional mobility due to decreased strength, ROM, balance, and activity tolerance, she will benefit from skilled PT to correct deficits, recommend she transition to STR following acute hospital stay.     Follow Up Recommendations SNF    Equipment Recommendations  Other (comment) (to be determined by next level of care)    Recommendations for Other Services OT consult     Precautions / Restrictions Precautions Precautions: Fall Restrictions Weight Bearing Restrictions: Yes LUE Weight Bearing: Weight bearing as tolerated RLE Weight Bearing: Partial weight bearing RLE Partial Weight Bearing Percentage or Pounds: 25%      Mobility  Bed Mobility Overal bed mobility: Needs  Assistance Bed Mobility: Supine to Sit     Supine to sit: Mod assist     General bed mobility comments: requirs assistance moving L LE and bringing trunk to upright position, cuing for hand placement, needs help scooting to EOB  Transfers Overall transfer level: Needs assistance Equipment used: Rolling walker (2 wheeled) Transfers: Sit to/from Stand Sit to Stand: Mod assist         General transfer comment: cuing for hand placement and maintaining WBing precautions   Ambulation/Gait Ambulation/Gait assistance: Mod assist Ambulation Distance (Feet): 2 Feet Assistive device: Rolling walker (2 wheeled) Gait Pattern/deviations: Step-to pattern;Shuffle;Decreased step length - right;Decreased step length - left;Antalgic;Trunk flexed   Gait velocity interpretation: <1.8 ft/sec, indicative of risk for recurrent falls General Gait Details: pt ambulated to chair next to bed, cuing for use of RW, demonstrates increased work of breathing and increase pain in [place], unable to pick up L foot for stepping requires mod assist for weight shifting and maintaining balance   Stairs            Wheelchair Mobility    Modified Rankin (Stroke Patients Only)       Balance Overall balance assessment: Needs assistance Sitting-balance support: Single extremity supported;Feet supported Sitting balance-Leahy Scale: Good Sitting balance - Comments: slightly unsteady sitting, initially required min assist but able to independently sit w/ UE support  Postural control: Posterior lean Standing balance support: Bilateral upper extremity supported Standing balance-Leahy Scale: Poor Standing balance comment: requires use of RW and mod assist to maintain balance and WBing precautions, unsteady on feet  Pertinent Vitals/Pain Pain Assessment: 0-10 Pain Score: 2  Pain Location: R hip increases with activity Pain Descriptors / Indicators: Aching Pain  Intervention(s): Limited activity within patient's tolerance;Monitored during session;Premedicated before session    Home Living Family/patient expects to be discharged to:: Private residence Living Arrangements: Other relatives;Children (lives with sister and son, son is moving out next week) Available Help at Discharge: Family;Available 24 hours/day Type of Home: House Home Access: Stairs to enter Entrance Stairs-Rails: Right;Left;Can reach both Entrance Stairs-Number of Steps: 5 Home Layout: One level Home Equipment: None      Prior Function Level of Independence: Independent               Hand Dominance        Extremity/Trunk Assessment   Upper Extremity Assessment Upper Extremity Assessment: Overall WFL for tasks assessed    Lower Extremity Assessment Lower Extremity Assessment: Generalized weakness;RLE deficits/detail (grossly at least 4/5 for tasks assessed) RLE Deficits / Details: increased pain on R LE during movement that limit AROM for hip and knee motions       Communication   Communication: No difficulties  Cognition Arousal/Alertness: Awake/alert Behavior During Therapy: WFL for tasks assessed/performed Overall Cognitive Status: Within Functional Limits for tasks assessed                      General Comments      Exercises General Exercises - Lower Extremity Ankle Circles/Pumps: AROM;Strengthening;Both;10 reps;Supine Quad Sets: AROM;Strengthening;10 reps;Both;Supine Heel Slides: AROM;Strengthening;Both;10 reps;Supine Hip ABduction/ADduction: AROM;Strengthening;Both;10 reps;Supine Straight Leg Raises: AROM;Strengthening;Both;10 reps;Supine   Assessment/Plan    PT Assessment Patient needs continued PT services  PT Problem List Decreased strength;Decreased range of motion;Decreased activity tolerance;Decreased balance;Decreased mobility;Decreased knowledge of use of DME;Cardiopulmonary status limiting activity       PT Treatment  Interventions DME instruction;Gait training;Functional mobility training;Balance training;Therapeutic exercise;Therapeutic activities;Patient/family education    PT Goals (Current goals can be found in the Care Plan section)  Acute Rehab PT Goals Patient Stated Goal: Get stronger and return home PT Goal Formulation: With patient Time For Goal Achievement: 02/11/17 Potential to Achieve Goals: Good    Frequency BID   Barriers to discharge Inaccessible home environment pt unable to perform home mobility    Co-evaluation               End of Session Equipment Utilized During Treatment: Gait belt;Oxygen Activity Tolerance: Patient limited by fatigue;Patient limited by pain Patient left: in chair;with family/visitor present;with chair alarm set;with SCD's reapplied Nurse Communication: Mobility status PT Visit Diagnosis: Unsteadiness on feet (R26.81);Muscle weakness (generalized) (M62.81);Difficulty in walking, not elsewhere classified (R26.2);History of falling (Z91.81)         Time: 1026-1100 PT Time Calculation (min) (ACUTE ONLY): 34 min   Charges:         PT G Codes:         Alesana Magistro Student PT 01/28/2017, 11:53 AM

## 2017-01-28 NOTE — Progress Notes (Addendum)
PT is recommending SNF. Social work Theatre manager met with patient and patient's daughter-in-law Stanton Kidney at bedside and presented bed offers. Patient chose [facility]. Lori Brady, [facility]ia[facility] is aware of above. Social work Theatre manager will continue to assist and follow as needed.   Danie Chandler, Social Work Intern  [phone]

## 2017-01-28 NOTE — Clinical Social Work Placement (Signed)
   CLINICAL SOCIAL WORK PLACEMENT  NOTE  Date:  01/28/2017  Patient Details  Name: Lori Brady MRN: [id number] Date of Birth: 1946/06/22  Clinical Social Work is seeking post-discharge placement for this patient at the Skilled  Nursing Facility level of care (*CSW will initial, date and re-position this form in  chart as items are completed):  Yes   Patient/family provided with [facility] Clinical Social Work Department's list of facilities offering this level of care within the geographic area requested by the patient (or if unable, by the patient's family).  Yes   Patient/family informed of their freedom to choose among providers that offer the needed level of care, that participate in Medicare, Medicaid or managed care program needed by the patient, have an available bed and are willing to accept the patient.  Yes   Patient/family informed of [facility]'s ownership interest in [facility] and [facility], as well as of the fact that they are under no obligation to receive care at these facilities.  PASRR submitted to EDS on 01/26/17     PASRR number received on 01/26/17     Existing PASRR number confirmed on       FL2 transmitted to all facilities in geographic area requested by pt/family on 01/26/17     FL2 transmitted to all facilities within larger geographic area on       Patient informed that his/her managed care company has contracts with or will negotiate with certain facilities, including the following:        Yes   Patient/family informed of bed offers received.  Patient chooses bed at  [facility] SNF)     Physician recommends and patient chooses bed at      Patient to be transferred to   on  .  Patient to be transferred to facility by       Patient family notified on   of transfer.  Name of family member notified:        PHYSICIAN       Additional Comment:    _______________________________________________ Ralene Bathe,  Student-Social Work 01/28/2017, 11:45 AM

## 2017-01-28 NOTE — Progress Notes (Signed)
[place] at [facility]                                                                                                                                                                                  Patient Demographics   Lori Brady, is a 71 y.o. female, DOB - May 30, 1946, CXK:[id number]  Admit date - 01/26/2017   Admitting Physician Harrie Foreman, MD  Outpatient Primary MD for the patient is Trinna Post, PA-C   LOS - 2  Subjective: Patient still having significant pain. Requiring IV pain medications nor chest pain Requiring oxygen now   Review of Systems:   CONSTITUTIONAL: No documented fever. No fatigue, weakness. No weight gain, no weight loss.  EYES: No blurry or double vision.  ENT: No tinnitus. No postnasal drip. No redness of the oropharynx.  RESPIRATORY: No cough, no wheeze, no hemoptysis. No dyspnea.  CARDIOVASCULAR: No chest pain. No orthopnea. No palpitations. No syncope.  GASTROINTESTINAL: No nausea, no vomiting or diarrhea. No abdominal pain. No melena or hematochezia.  GENITOURINARY: No dysuria or hematuria.  ENDOCRINE: No polyuria or nocturia. No heat or cold intolerance.  HEMATOLOGY: No anemia. No bruising. No bleeding.  INTEGUMENTARY: No rashes. No lesions.  MUSCULOSKELETAL: Positive hip pain NEUROLOGIC: No numbness, tingling, or ataxia. No seizure-type activity.  PSYCHIATRIC: No anxiety. No insomnia. No ADD.    Vitals:   Vitals:   01/28/17 0501 01/28/17 0804 01/28/17 1043 01/28/17 1150  BP: 117/70 (!) 116/54 (!) 125/51 (!) 102/53  Pulse: 92 90 98 (!) 103  Resp: 18 18  20   Temp: 97.7 F (36.5 C) 98.6 F (37 C)  98.6 F (37 C)  TempSrc: Oral Oral  Oral  SpO2: 98% 96%  94%  Weight:      Height:        Wt Readings from Last 3 Encounters:  01/28/17 164 lb 1.6 oz (74.4 kg)  11/17/16 151 lb (68.5 kg)  11/12/16 151 lb 12.8 oz (68.9 kg)     Intake/Output Summary (Last 24 hours) at 01/28/17 1320 Last data filed at  01/28/17 0800  Gross per 24 hour  Intake              240 ml  Output              150 ml  Net               90 ml    Physical Exam:   GENERAL: Pleasant-appearing in no apparent distress.  HEAD, EYES, EARS, NOSE AND THROAT: Atraumatic, normocephalic. Extraocular muscles are intact. Pupils equal and reactive to light. Sclerae anicteric. No conjunctival injection. No oro-pharyngeal erythema.  NECK: Supple. There is no jugular venous distention. No bruits, no lymphadenopathy, no thyromegaly.  HEART: Regular rate and rhythm,. No murmurs, no rubs, no clicks.  LUNGS: Rhonchus breath some  ABDOMEN: Soft, flat, nontender, nondistended. Has good bowel sounds. No hepatosplenomegaly appreciated.  EXTREMITIES: No evidence of any cyanosis, clubbing, or peripheral edema.  +2 pedal and radial pulses bilaterally.  NEUROLOGIC: The patient is alert, awake, and oriented x3 with no focal motor or sensory deficits appreciated bilaterally.  SKIN: Moist and warm with no rashes appreciated.  Psych: Not anxious, depressed LN: No inguinal LN enlargement    Antibiotics   Anti-infectives    None      Medications   Scheduled Meds: . aspirin EC  81 mg Oral Daily  . cholecalciferol  2,000 Units Oral Daily  . citalopram  20 mg Oral QHS  . docusate sodium  100 mg Oral BID  . doxepin  25 mg Oral QHS  . enoxaparin (LOVENOX) injection  40 mg Subcutaneous Q24H  . fluticasone furoate-vilanterol  1 puff Inhalation Daily  . folic acid  1 mg Oral Daily  . ibuprofen  400 mg Oral TID  . lisinopril  20 mg Oral Daily  . montelukast  10 mg Oral Daily  . multivitamin with minerals  1 tablet Oral Daily  . omega-3 acid ethyl esters  1 g Oral BID  . pantoprazole  40 mg Oral Daily  . potassium chloride SA  20 mEq Oral Daily  . simvastatin  40 mg Oral QHS  . sodium chloride flush  3 mL Intravenous Q12H  . thiamine  100 mg Oral Daily   Or  . thiamine  100 mg Intravenous Daily  . tiotropium  1 capsule Inhalation Daily    Continuous Infusions:  PRN Meds:.acetaminophen **OR** acetaminophen, albuterol, ALPRAZolam, diphenhydrAMINE, hydrALAZINE, HYDROmorphone (DILAUDID) injection, ondansetron **OR** ondansetron (ZOFRAN) IV, oxyCODONE-acetaminophen   Data Review:   Micro Results Recent Results (from the past 240 hour(s))  Surgical pcr screen     Status: None   Collection Time: 01/26/17  4:14 AM  Result Value Ref Range Status   MRSA, PCR NEGATIVE NEGATIVE Final   Staphylococcus aureus NEGATIVE NEGATIVE Final    Comment:        The Xpert SA Assay (FDA approved for NASAL specimens in patients over 24 years of age), is one component of a comprehensive surveillance program.  Test performance has been validated by [facility] for patients greater than or equal to 81 year old. It is not intended to diagnose infection nor to guide or monitor treatment.     Radiology Reports Ct Hip Right Wo Contrast  Result Date: 01/26/2017 CLINICAL DATA:  71 year old female with fall and right hip pain. EXAM: CT OF THE RIGHT HIP WITHOUT CONTRAST TECHNIQUE: Multidetector CT imaging of the right hip was performed according to the standard protocol. Multiplanar CT image reconstructions were also generated. COMPARISON:  Right hip radiograph dated 01/26/2017 FINDINGS: Bones/Joint/Cartilage There is a faint linear lucency in the anterior right acetabulum concerning for nondisplaced anterior column fracture. There is a nondisplaced fracture of the left pubic ramus adjacent to the symphysis pubis. No definite other fracture identified, however evaluation for fracture is very limited due to osteopenia. There is no dislocation. Ligaments Suboptimally assessed by CT. Muscles and Tendons No intramuscular hematoma or injury. Soft tissues Unremarkable. IMPRESSION: Probable nondisplaced fracture of the anterior column of the right acetabulum as well as nondisplaced fracture of left pubic ramus adjacent to symphysis pubis. Evaluation for  fracture is very limited due to osteopenia. MRI may provide better evaluation if there is high clinical concern for femoral fracture. No dislocation. Electronically Signed   By: Anner Crete M.D.   On: 01/26/2017 02:35   Dg Hip Unilat With Pelvis 2-3 Views Right  Result Date: 01/26/2017 CLINICAL DATA:  Right hip pain after a fall. EXAM: DG HIP (WITH OR WITHOUT PELVIS) 2-3V RIGHT COMPARISON:  None. FINDINGS: Mild degenerative changes in the right hip with narrowing and sclerosis of the superior acetabular joint and osteophytes predominantly on the acetabular surface. No evidence of acute fracture or dislocation in the right hip. Pelvis appears intact. SI joints and symphysis pubis are not displaced. Can't exclude sclerosis in the SI joints although overlying bowel gas limits evaluation. No focal bone lesion or bone destruction. Soft tissues are unremarkable. IMPRESSION: Mild degenerative changes in the right hip. No acute displaced fractures identified. Electronically Signed   By: Lucienne Capers M.D.   On: 01/26/2017 00:35     CBC  Recent Labs Lab 01/26/17 0008  WBC 9.3  HGB 12.8  HCT 36.9  PLT 143*  MCV 90.4  MCH 31.2  MCHC 34.5  RDW 12.4  LYMPHSABS 2.5  MONOABS 0.9  EOSABS 0.5  BASOSABS 0.0    Chemistries   Recent Labs Lab 01/26/17 0008 01/27/17 0523 01/28/17 0529  NA 135 132* 131*  K 3.9 3.3* 4.1  CL 103 99* 98*  CO2 24 25 24   GLUCOSE 118* 113* 103*  BUN 8 8 21*  CREATININE 0.64 0.54 0.98  CALCIUM 8.7* 8.4* 8.4*  AST 192* 79*  --   ALT 142* 83*  --   ALKPHOS 116 92  --   BILITOT 1.0 2.2*  --    ------------------------------------------------------------------------------------------------------------------ estimated creatinine clearance is 53.1 mL/min (by C-G formula based on SCr of 0.98 mg/dL). ------------------------------------------------------------------------------------------------------------------  Recent Labs  01/26/17 0008  HGBA1C 5.3    ------------------------------------------------------------------------------------------------------------------ No results for input(s): CHOL, HDL, LDLCALC, TRIG, CHOLHDL, LDLDIRECT in the last 72 hours. ------------------------------------------------------------------------------------------------------------------  Recent Labs  01/26/17 0008  TSH 6.432*   ------------------------------------------------------------------------------------------------------------------ No results for input(s): VITAMINB12, FOLATE, FERRITIN, TIBC, IRON, RETICCTPCT in the last 72 hours.  Coagulation profile  Recent Labs Lab 01/26/17 0008  INR 1.13    No results for input(s): DDIMER in the last 72 hours.  Cardiac Enzymes No results for input(s): CKMB, TROPONINI, MYOGLOBIN in the last 168 hours.  Invalid input(s): CK ------------------------------------------------------------------------------------------------------------------ Invalid input(s): [place]   This is a 71 year old female admitted for right hip fracture. 1. Hip fracture Nonoperative management recommended Partial weightbearing recommended PT evaluationDone recommend skilled nursing facility I just pain medication still in pain 2. Alcohol use patient denies abuse stop CIWA protocol 3. COPD: Continue inhalers per home regimen will change to nebulizers schedule requiring oxygen 4. CAD: Stable; continue aspirin 5. Hypertension:  Blood pressure now under better control 6. Hypothyroidism: Continue Synthroid.  7. Hyperlipidemia: Continue statin therapy 8. DVT prophylaxis: SCDs 9. GI prophylaxis: None       Code Status Orders        Start     Ordered   01/26/17 0406  Full code  Continuous     01/26/17 0405    Code Status History    Date Active Date Inactive Code Status Order ID Comments User Context   10/08/2016  3:56 PM 10/13/2016  6:14 PM Full Code [id number]  Nicholes Mango, MD Inpatient  Consults Orthopedics DVT Prophylaxis SCDs  Lab Results  Component Value Date   PLT 143 (L) 01/26/2017     Time Spent in minutes   39mn  Greater than 50% of time spent in care coordination and counseling patient regarding the condition and plan of care.   PDustin FlockM.D on 01/28/2017 at 1:20 PM  Between 7am to 6pm - Pager - [phone]  After 6pm go to [email or web address] - password [email or web address]   Office  [phone]

## 2017-01-28 NOTE — Progress Notes (Signed)
Physical Therapy Treatment Patient Details Name: Lori Brady MRN: [id number] DOB: November 18, 1945 Today's Date: 01/28/2017    History of Present Illness 71 yo female admitted to [facility] w/ a fall and R hip pain, imaging determined a nondisplaced fracture of the R acetabulum and L superior pubic rami. PMH includes; COPD, arthritis, asthma, Chron's disease, Emphyseyma, heart attack, stroke, HTN and frequent UTIs    PT Comments    Pt stated she was feeling tired but willing to participate in PT treatment. Pt still presents w/ R hip pain that worsens with WBing. She displayed improved mobility this afternoon and was able to move from supine to sitting at EOB with mod assist initially for advancement of the R LE but able to obtain sitting upright w/ min assist. Educated patient on exercise program and the importance of moving her R LE to increase strength for functional tasks. She was able to transfer to standing using a RW with improved technique this afternoon, required mod assist on the first attempt with cuing for UE and LE placement, but able to transfer a second time w/ min assist and maintain standing w/ min guarding. Patient educated on WBing precautions and able to maintain PWBing on the R LE in standing. Pt refused to ambulate due to pain. Overall pt is progressing but still severely limited in mobility due to decrease strength, ROM and balance as well as increased pain, she will continue to benefit from skilled PT to correct deficits and improve overall functional mobility. Pt still appropriate for STR following acute hospital stay.    Follow Up Recommendations  SNF     Equipment Recommendations  Other (comment) (to be determined at next level of care)    Recommendations for Other Services OT consult     Precautions / Restrictions Precautions Precautions: Fall Restrictions Weight Bearing Restrictions: Yes LUE Weight Bearing: Weight bearing as tolerated RLE Weight Bearing: Partial  weight bearing RLE Partial Weight Bearing Percentage or Pounds: 25%    Mobility  Bed Mobility Overal bed mobility: Needs Assistance Bed Mobility: Supine to Sit     Supine to sit: Mod assist     General bed mobility comments: improved mobility, required mod assist initially but to advance L LE but able to bring trunk into upright position and scoot to EOB with min assist   Transfers Overall transfer level: Needs assistance Equipment used: Rolling walker (2 wheeled) Transfers: Sit to/from Stand Sit to Stand: Mod assist         General transfer comment: pt displayed improved technique, cuing for placing R LE slightly anterior and L LE posterior and using the L UE for push off from the bed to better maintain PWBing of R LE  Ambulation/Gait             General Gait Details: not attempted this afternoon patient refused due to pain   Stairs            Wheelchair Mobility    Modified Rankin (Stroke Patients Only)       Balance Overall balance assessment: Needs assistance Sitting-balance support: Single extremity supported;Feet supported Sitting balance-Leahy Scale: Good Sitting balance - Comments: improved sitting balance from this morning able to maintain sitting posture w/o back support requires occasional UE support    Standing balance support: Bilateral upper extremity supported Standing balance-Leahy Scale: Fair Standing balance comment: Improved standing balance initially would lean posteriorly but lowered RW to more appropriate height and patient able to maintain standing posture w/ min assist  Cognition Arousal/Alertness: Awake/alert Behavior During Therapy: WFL for tasks assessed/performed Overall Cognitive Status: Within Functional Limits for tasks assessed                      Exercises General Exercises - Lower Extremity Ankle Circles/Pumps: AROM;Strengthening;Both;10 reps;Supine Quad Sets:  AROM;Strengthening;10 reps;Both;Supine Gluteal Sets: AROM;Strengthening;Both;10 reps;Supine Short Arc Quad: AROM;Strengthening;Right;10 reps;Supine Long Arc Quad: AROM;Strengthening;Right;10 reps;Seated Heel Slides: AROM;Strengthening;10 reps;Supine;Right Hip ABduction/ADduction: AROM;Strengthening;10 reps;Supine;Right Straight Leg Raises: AROM;Strengthening;10 reps;Supine;Right Hip Flexion/Marching: AROM;Strengthening;Right;10 reps;Seated    General Comments        Pertinent Vitals/Pain Pain Assessment: 0-10 Pain Score: 4  Pain Location: R hip increases with activity Pain Descriptors / Indicators: Aching;Grimacing Pain Intervention(s): Monitored during session;Premedicated before session;Limited activity within patient's tolerance;Repositioned    Home Living                      Prior Function            PT Goals (current goals can now be found in the care plan section) Acute Rehab PT Goals Patient Stated Goal: Get stronger and return home PT Goal Formulation: With patient Time For Goal Achievement: 02/11/17 Potential to Achieve Goals: Good Progress towards PT goals: Progressing toward goals    Frequency    BID      PT Plan Current plan remains appropriate    Co-evaluation             End of Session Equipment Utilized During Treatment: Gait belt;Oxygen Activity Tolerance: Patient limited by pain Patient left: in bed;with bed alarm set;with SCD's reapplied Nurse Communication: Mobility status PT Visit Diagnosis: Unsteadiness on feet (R26.81);Muscle weakness (generalized) (M62.81);Difficulty in walking, not elsewhere classified (R26.2);History of falling (Z91.81)     Time: 4098-1191 PT Time Calculation (min) (ACUTE ONLY): 30 min  Charges:  $Therapeutic Exercise: 8-22 mins $Therapeutic Activity: 8-22 mins                    G Codes:       Pharmacist, hospital PT 01/28/2017, 3:55 PM

## 2017-01-29 ENCOUNTER — Inpatient Hospital Stay: Payer: Medicare Other

## 2017-01-29 MED ORDER — OXYCODONE-ACETAMINOPHEN 5-325 MG PO TABS: 1.0000 | ORAL_TABLET | ORAL | 0 refills | Status: DC | PRN

## 2017-01-29 MED ORDER — DOCUSATE SODIUM 100 MG PO CAPS: 100.0000 mg | ORAL_CAPSULE | Freq: Two times a day (BID) | ORAL | 0 refills | Status: DC

## 2017-01-29 MED ORDER — PREDNISONE 10 MG (21) PO TBPK: ORAL_TABLET | ORAL | 0 refills | Status: DC

## 2017-01-29 MED ORDER — PREDNISONE 50 MG PO TABS: 50.0000 mg | ORAL_TABLET | Freq: Every day | ORAL | Status: DC

## 2017-01-29 MED ORDER — CEFUROXIME AXETIL 500 MG PO TABS: 500.0000 mg | ORAL_TABLET | Freq: Two times a day (BID) | ORAL | 0 refills | Status: AC

## 2017-01-29 MED ORDER — DEXTROSE 5 % IV SOLN
1.0000 g | Freq: Once | INTRAVENOUS | Status: AC
  Administered 2017-01-29: 1 g via INTRAVENOUS
  Filled 2017-01-29: qty 10

## 2017-01-29 MED ORDER — ENOXAPARIN SODIUM 40 MG/0.4ML ~~LOC~~ SOLN
40.0000 mg | SUBCUTANEOUS | Status: DC
Start: 2017-01-29 — End: 2017-03-02

## 2017-01-29 MED ORDER — GUAIFENESIN ER 600 MG PO TB12
600.0000 mg | ORAL_TABLET | Freq: Two times a day (BID) | ORAL | Status: DC
  Administered 2017-01-29: 600 mg via ORAL
  Filled 2017-01-29: qty 1

## 2017-01-29 MED ORDER — GUAIFENESIN ER 600 MG PO TB12: 600.0000 mg | ORAL_TABLET | Freq: Two times a day (BID) | ORAL | Status: DC

## 2017-01-29 MED ORDER — LORAZEPAM 0.5 MG PO TABS: 0.5000 mg | ORAL_TABLET | Freq: Every day | ORAL | 0 refills | Status: DC | PRN

## 2017-01-29 MED ORDER — AZITHROMYCIN 500 MG PO TABS: 500.0000 mg | ORAL_TABLET | Freq: Every day | ORAL | 0 refills | Status: AC

## 2017-01-29 NOTE — Clinical Social Work Placement (Signed)
   CLINICAL SOCIAL WORK PLACEMENT  NOTE  Date:  01/29/2017  Patient Details  Name: Lori Brady MRN: [id number] Date of Birth: 12-19-1945  Clinical Social Work is seeking post-discharge placement for this patient at the Skilled  Nursing Facility level of care (*CSW will initial, date and re-position this form in  chart as items are completed):  Yes   Patient/family provided with [facility] Clinical Social Work Department's list of facilities offering this level of care within the geographic area requested by the patient (or if unable, by the patient's family).  Yes   Patient/family informed of their freedom to choose among providers that offer the needed level of care, that participate in Medicare, Medicaid or managed care program needed by the patient, have an available bed and are willing to accept the patient.  Yes   Patient/family informed of [facility]'s ownership interest in [facility] and [facility], as well as of the fact that they are under no obligation to receive care at these facilities.  PASRR submitted to EDS on 01/26/17     PASRR number received on 01/26/17     Existing PASRR number confirmed on       FL2 transmitted to all facilities in geographic area requested by pt/family on 01/26/17     FL2 transmitted to all facilities within larger geographic area on       Patient informed that his/her managed care company has contracts with or will negotiate with certain facilities, including the following:        Yes   Patient/family informed of bed offers received.  Patient chooses bed at  [facility])     Physician recommends and patient chooses bed at      Patient to be transferred to  ([facility] ) on 01/29/17.  Patient to be transferred to facility by  [facility] )     Patient family notified on 01/29/17 of transfer.  Name of family member notified:   (Patient's sister Lori Brady is at bedside and aware of D/C today. )     PHYSICIAN        Additional Comment:    _______________________________________________ Safi Culotta, Darleen Crocker, LCSW 01/29/2017, 2:39 PM

## 2017-01-29 NOTE — Evaluation (Signed)
Occupational Therapy Evaluation Patient Details Name: Lori Brady MRN: [id number] DOB: [birth year] Today's Date: 01/29/2017    History of Present Illness Pt. is a 71 y.o. female who was admitted to [facility] with a nondisplaced Fracture of the right acetabulum, and left superior pubic rami fracture. Pt. PMHx includes: COPD, Arthritis, Asthma, Chron;s Disease, Emphysema, Heart Attack, Stroke, HTN, and frequent UTI's.   Clinical Impression   Pt. Is a 71 y.o. Female who was admitted to [facility] with a nondisplaced Fracture of the right acetabulum. Pt. is on 2LO2 via [place]. SO2 is 94%. Pt. Presents with decreased activity tolerance, 8/10 pain, weakness, and impaired functional mobility which hinder her ability to complete ADL, and IADL tasks. Pt. could benefit from OT services for ADL training, A/E training, UE there. Ex, and pt. education about home modification, and DME. Pt. could benefit from a shower chair. Pt. would benefit from SNF level of care upon discharge with follow-up OT services.    Follow Up Recommendations  SNF    Equipment Recommendations  Tub/shower seat    Recommendations for Other Services       Precautions / Restrictions Precautions Precautions: Fall Restrictions Weight Bearing Restrictions: Yes LUE Weight Bearing: Weight bearing as tolerated RLE Weight Bearing: Partial weight bearing RLE Partial Weight Bearing Percentage or Pounds: 25% LLE Weight Bearing: Weight bearing as tolerated              ADL Overall ADL's : Needs assistance/impaired Eating/Feeding: Independent;Set up   Grooming: Independent;Set up;Bed level               Lower Body Dressing: Maximal assistance               Functional mobility during ADLs: Maximal assistance General ADL Comments: Pt. education was provided about A/E use for LE ADLs, home routines, and DME. Pt. was provided with a visual handout about tub benches/shower chairs.     Vision Baseline Vision/History: Wears  glasses Patient Visual Report: No change from baseline       Perception     Praxis      Pertinent Vitals/Pain Pain Assessment: 0-10 Pain Score: 8  Pain Location: Right Hip Pain Descriptors / Indicators: Aching Pain Intervention(s): Limited activity within patient's tolerance;Monitored during session;Patient requesting pain meds-RN notified;Repositioned     Hand Dominance Right   Extremity/Trunk Assessment Upper Extremity Assessment Upper Extremity Assessment: Overall WFL for tasks assessed           Communication Communication Communication: No difficulties   Cognition Arousal/Alertness: Awake/alert Behavior During Therapy: WFL for tasks assessed/performed Overall Cognitive Status: Within Functional Limits for tasks assessed                     General Comments       Exercises       Shoulder Instructions      Home Living Family/patient expects to be discharged to:: Private residence Living Arrangements: Other relatives (Sister) Available Help at Discharge: Family Type of Home: House Home Access: Stairs to enter Secretary/administrator of Steps: 5 Entrance Stairs-Rails: Right;Left;Can reach both Home Layout: One level     Bathroom Shower/Tub: Producer, television/film/video: Standard Bathroom Accessibility: Yes   Home Equipment: None          Prior Functioning/Environment Level of Independence: Independent        Comments: Independent with ADLs, IADLs, driving, medication management, and shares cooking responsibilities with her sister. Pt. is retired.  OT Problem List: Decreased strength;Decreased range of motion;Decreased activity tolerance;Pain;Decreased knowledge of use of DME or AE;Impaired balance (sitting and/or standing)      OT Treatment/Interventions: Self-care/ADL training    OT Goals(Current goals can be found in the care plan section) Acute Rehab OT Goals Patient Stated Goal: To return home OT Goal Formulation:  With patient Potential to Achieve Goals: Good ADL Goals Pt Will Perform Lower Body Dressing: with modified independence Pt Will Transfer to Toilet: with modified independence  OT Frequency: Min 3X/week   Barriers to D/C:            Co-evaluation              End of Session    Activity Tolerance: Patient tolerated treatment well Patient left: in bed;with bed alarm set;with call bell/phone within reach  OT Visit Diagnosis: Muscle weakness (generalized) (M62.81)                ADL either performed or assessed with clinical judgement  Time: 1128-1150 OT Time Calculation (min): 22 min Charges:  OT General Charges $OT Visit: 1 Procedure OT Evaluation $OT Eval Moderate Complexity: 1 Procedure G-Codes:     Olegario Messier, MS, OTR/L   Olegario Messier, MS, OTR/L 01/29/2017, 1:36 PM

## 2017-01-29 NOTE — Discharge Summary (Addendum)
[place] at [facility], 71 y.o., DOB 1946/10/23, MRN [id number]. Admission date: 01/26/2017 Discharge Date 01/29/2017 Primary MD Trinna Post, PA-C Admitting Physician Harrie Foreman, MD  Admission Diagnosis  Acute pain due to injury [G89.11] Pubic ramus fracture, left, closed, initial encounter [facility]) [S32.592A] Closed fracture of anterior lip of right acetabulum without additional fracture [facility]) [S32.491A]  Discharge Diagnosis   Active Problems:   Hip fracture ([place])  Acute COPD exasperation   OA  Crohn's disease GERD Essential hypertension hyperlipidemia Seasonal allergies         Hospital Course *The patient with past medical history of COPD and I'll call these presents emergency department complaining of pain after a fall. She states that she was trying to pick up something she dropped in the kitchen when she lost her balance. X-ray of her right hip in the emergency department revealed a nondisplaced fracture. Patient was seen by orthopedic who recommended nonsurgical approach. Patient will need outpatient follow-up with orthopedics to determine further course of therapy. She is very weak and deconditioned need of rehabilitation. She also requiring oxygen now. She started to have COPD exasperation and is currently being treated for acute COPD exasperation. CXR showed possible pna  Lovenox therapy duration 3 weeks  Azithromycin 569m po daily x 5 days ceftin 5050mpo bid x 6 days          Consults  orthopedic surgery  Significant Tests:  See full reports for all details     Ct Hip Right Wo Contrast  Result Date: 01/26/2017 CLINICAL DATA:  [age]ear old female with fall and right hip pain. EXAM: CT OF THE RIGHT HIP WITHOUT CONTRAST TECHNIQUE: Multidetector CT imaging of the right hip was performed according to the standard protocol. Multiplanar CT image reconstructions were also generated. COMPARISON:  Right hip  radiograph dated 01/26/2017 FINDINGS: Bones/Joint/Cartilage There is a faint linear lucency in the anterior right acetabulum concerning for nondisplaced anterior column fracture. There is a nondisplaced fracture of the left pubic ramus adjacent to the symphysis pubis. No definite other fracture identified, however evaluation for fracture is very limited due to osteopenia. There is no dislocation. Ligaments Suboptimally assessed by CT. Muscles and Tendons No intramuscular hematoma or injury. Soft tissues Unremarkable. IMPRESSION: Probable nondisplaced fracture of the anterior column of the right acetabulum as well as nondisplaced fracture of left pubic ramus adjacent to symphysis pubis. Evaluation for fracture is very limited due to osteopenia. MRI may provide better evaluation if there is high clinical concern for femoral fracture. No dislocation. Electronically Signed   By: ArAnner Crete.D.   On: 01/26/2017 02:35   Dg Chest Port 1 View  Result Date: 01/29/2017 CLINICAL DATA:  Hypoxia EXAM: PORTABLE CHEST 1 VIEW COMPARISON:  In 10/2019 07/05/2016 FINDINGS: 1258 hours. Left lower lung airspace disease suggests pneumonia. There is some atelectasis or infiltrate in the right base. No overt pulmonary edema or substantial pleural effusion. Cardiopericardial silhouette is at upper limits of normal for size. The visualized bony structures of the thorax are intact. IMPRESSION: Left lower lobe airspace disease compatible with pneumonia. Mild right base atelectasis or infiltrate. Electronically Signed   By: ErMisty Stanley.D.   On: 01/29/2017 13:16   Dg Hip Unilat With Pelvis 2-3 Views Right  Result Date: 01/26/2017 CLINICAL DATA:  Right hip pain after a fall. EXAM: DG HIP (WITH OR WITHOUT PELVIS) 2-3V RIGHT COMPARISON:  None. FINDINGS: Mild degenerative changes in the right hip with narrowing  and sclerosis of the superior acetabular joint and osteophytes predominantly on the acetabular surface. No evidence of  acute fracture or dislocation in the right hip. Pelvis appears intact. SI joints and symphysis pubis are not displaced. Can't exclude sclerosis in the SI joints although overlying bowel gas limits evaluation. No focal bone lesion or bone destruction. Soft tissues are unremarkable. IMPRESSION: Mild degenerative changes in the right hip. No acute displaced fractures identified. Electronically Signed   By: Lucienne Capers M.D.   On: 01/26/2017 00:35       Today   Subjective:   Lori Brady  Patient still has some pain in the hip  Objective:   Blood pressure (!) 104/50, pulse 92, temperature 98.7 F (37.1 C), temperature source Oral, resp. rate 18, height 5' 3"  (1.6 m), weight 159 lb 3.2 oz (72.2 kg), SpO2 93 %.  .  Intake/Output Summary (Last 24 hours) at 01/29/17 1403 Last data filed at 01/29/17 0900  Gross per 24 hour  Intake              483 ml  Output                0 ml  Net              483 ml    Exam VITAL SIGNS: Blood pressure (!) 104/50, pulse 92, temperature 98.7 F (37.1 C), temperature source Oral, resp. rate 18, height 5' 3"  (1.6 m), weight 159 lb 3.2 oz (72.2 kg), SpO2 93 %.  GENERAL:  71 y.o.-year-old patient lying in the bed with no acute distress.  EYES: Pupils equal, round, reactive to light and accommodation. No scleral icterus. Extraocular muscles intact.  HEENT: Head atraumatic, normocephalic. Oropharynx and nasopharynx clear.  NECK:  Supple, no jugular venous distention. No thyroid enlargement, no tenderness.  LUNGS:Bilateral wheezing throughout both lung  rales,rhonchi or crepitation. No use of accessory muscles of respiration.  CARDIOVASCULAR: S1, S2 normal. No murmurs, rubs, or gallops.  ABDOMEN: Soft, nontender, nondistended. Bowel sounds present. No organomegaly or mass.  EXTREMITIES: No pedal edema, cyanosis, or clubbing.  NEUROLOGIC: Cranial nerves II through XII are intact. Muscle strength 5/5 in all extremities. Sensation intact. Gait not checked.   PSYCHIATRIC: The patient is alert and oriented x 3.  SKIN: No obvious rash, lesion, or ulcer.   Data Review     CBC w Diff:  Lab Results  Component Value Date   WBC 9.3 01/26/2017   HGB 12.8 01/26/2017   HGB 11.8 (L) 03/10/2015   HCT 36.9 01/26/2017   HCT 37.7 11/02/2016   PLT 143 (L) 01/26/2017   PLT 147 (L) 11/02/2016   LYMPHOPCT 27 01/26/2017   LYMPHOPCT 7.0 03/09/2015   BANDSPCT 24 10/09/2016   MONOPCT 9 01/26/2017   MONOPCT 9 03/10/2015   MONOPCT 1.5 03/09/2015   EOSPCT 5 01/26/2017   EOSPCT 0.1 03/09/2015   BASOPCT 0 01/26/2017   BASOPCT 0.1 03/09/2015   CMP:  Lab Results  Component Value Date   NA 131 (L) 01/28/2017   NA 126 (L) 10/23/2016   NA 136 03/10/2015   K 4.1 01/28/2017   K 3.6 03/10/2015   CL 98 (L) 01/28/2017   CL 103 03/10/2015   CO2 24 01/28/2017   CO2 26 03/10/2015   BUN 21 (H) 01/28/2017   BUN 13 10/23/2016   BUN 13 03/10/2015   CREATININE 0.98 01/28/2017   CREATININE 0.79 03/10/2015   PROT 6.7 01/27/2017   PROT 6.7 10/19/2016  PROT 7.5 04/26/2014   ALBUMIN 3.6 01/27/2017   ALBUMIN 4.2 10/23/2016   ALBUMIN 3.6 04/26/2014   BILITOT 2.2 (H) 01/27/2017   BILITOT 1.2 10/19/2016   BILITOT 0.7 04/26/2014   ALKPHOS 92 01/27/2017   ALKPHOS 76 04/26/2014   AST 79 (H) 01/27/2017   AST 27 04/26/2014   ALT 83 (H) 01/27/2017   ALT 37 04/26/2014  .  Micro Results Recent Results (from the past 240 hour(s))  Surgical pcr screen     Status: None   Collection Time: 01/26/17  4:14 AM  Result Value Ref Range Status   MRSA, PCR NEGATIVE NEGATIVE Final   Staphylococcus aureus NEGATIVE NEGATIVE Final    Comment:        The Xpert SA Assay (FDA approved for NASAL specimens in patients over 60 years of age), is one component of a comprehensive surveillance program.  Test performance has been validated by [facility] for patients greater than or equal to 35 year old. It is not intended to diagnose infection nor to guide or monitor  treatment.         Code Status Orders        Start     Ordered   01/26/17 0406  Full code  Continuous     01/26/17 0405    Code Status History    Date Active Date Inactive Code Status Order ID Comments User Context   10/08/2016  3:56 PM 10/13/2016  6:14 PM Full Code [id number]  Nicholes Mango, MD Inpatient           Contact information for follow-up providers    Thornton Park, MD Follow up in 2 week(s).   Specialty:  Orthopedic Surgery Contact information: [place] [address] [phone]            Contact information for after-discharge care    Destination    [facility] Follow up.   Specialty:  [place] information: [address] [place] [phone]                  Discharge Medications   Allergies as of 01/29/2017      Reactions   Levaquin [levofloxacin] Nausea Only   Vicodin [hydrocodone-acetaminophen] Nausea Only      Medication List    STOP taking these medications   lisinopril 10 MG tablet Commonly known as:  PRINIVIL,ZESTRIL     TAKE these medications   albuterol 0.63 MG/3ML nebulizer solution Commonly known as:  ACCUNEB Take 1 ampule by nebulization every 6 (six) hours as needed for wheezing.   albuterol 108 (90 Base) MCG/ACT inhaler Commonly known as:  PROVENTIL HFA;VENTOLIN HFA Inhale 2 puffs into the lungs every 4 (four) hours as needed for wheezing or shortness of breath.   alendronate 70 MG tablet Commonly known as:  FOSAMAX Take 1 tablet (70 mg total) by mouth once a week.   ALPRAZolam 0.5 MG tablet Commonly known as:  XANAX Take 0.5 mg by mouth at bedtime as needed for anxiety.   aspirin 81 MG tablet Take 81 mg by mouth daily.   azithromycin 500 MG tablet Commonly known as:  ZITHROMAX Take 1 tablet (500 mg total) by mouth daily.   betamethasone valerate 0.1 % cream Commonly known as:  VALISONE APPLY TOPICALLY TO  AFFECTED AREA SPARINGLY 2 TIMES DAILY.   BREO ELLIPTA 100-25 MCG/INH Aepb Generic drug:  fluticasone furoate-vilanterol Inhale 1 puff into the lungs daily.  cefUROXime 500 MG tablet Commonly known as:  CEFTIN Take 1 tablet (500 mg total) by mouth 2 (two) times daily with a meal.   chlorthalidone 25 MG tablet Commonly known as:  HYGROTON Take 1 tablet (25 mg total) by mouth daily.   citalopram 20 MG tablet Commonly known as:  CELEXA Take 1 tablet (20 mg total) by mouth daily. What changed:  when to take this   docusate sodium 100 MG capsule Commonly known as:  COLACE Take 1 capsule (100 mg total) by mouth 2 (two) times daily.   doxepin 25 MG capsule Commonly known as:  SINEQUAN Take 25 mg by mouth at bedtime.   enoxaparin 40 MG/0.4ML injection Commonly known as:  LOVENOX Inject 0.4 mLs (40 mg total) into the skin daily.   Fish Oil 1000 MG Caps Take 1 capsule by mouth daily.   guaiFENesin 600 MG 12 hr tablet Commonly known as:  MUCINEX Take 1 tablet (600 mg total) by mouth 2 (two) times daily.   hydrOXYzine 10 MG tablet Commonly known as:  ATARAX/VISTARIL Take 10-20 mg by mouth at bedtime.   LORazepam 0.5 MG tablet Commonly known as:  ATIVAN Take 1 tablet (0.5 mg total) by mouth daily as needed for anxiety.   montelukast 10 MG tablet Commonly known as:  SINGULAIR Take 1 tablet (10 mg total) by mouth daily.   oxyCODONE-acetaminophen 5-325 MG tablet Commonly known as:  PERCOCET/ROXICET Take 1-2 tablets by mouth every 4 (four) hours as needed for moderate pain.   potassium chloride SA 20 MEQ tablet Commonly known as:  K-DUR,KLOR-CON Take 20 mEq by mouth daily.   predniSONE 10 MG (21) Tbpk tablet Commonly known as:  STERAPRED UNI-PAK 21 TAB Start at 80m taper by 153muntil complete   simvastatin 40 MG tablet Commonly known as:  ZOCOR TAKE 1 TABLET BY MOUTH AT BEDTIME.   SPIRIVA RESPIMAT 2.5 MCG/ACT Aers Generic drug:  Tiotropium Bromide  Monohydrate Inhale 2 puffs into the lungs daily.   Vitamin D 2000 units Caps Take 1 capsule by mouth daily.          Total Time in preparing paper work, data evaluation and todays exam - 35 minutes  PADustin Flock.D on 01/29/2017 at 2:03 PME[facility] LLCEa[facility] Center[facility]ysicians   Office  [phone]

## 2017-01-29 NOTE — Discharge Instructions (Addendum)
[place] at [place]:  Cardiac diet   DISCHARGE CONDITION:  Stable  ACTIVITY:  Partial 25% weight bearing on right leg  OXYGEN:  Home Oxygen:  yest   Oxygen Delivery: 2l  DISCHARGE LOCATION:  home    ADDITIONAL DISCHARGE INSTRUCTION:    If you experience worsening of your admission symptoms, develop shortness of breath, life threatening emergency, suicidal or homicidal thoughts you must seek medical attention immediately by calling 911 or calling your MD immediately  if symptoms less severe.  You Must read complete instructions/literature along with all the possible adverse reactions/side effects for all the Medicines you take and that have been prescribed to you. Take any new Medicines after you have completely understood and accpet all the possible adverse reactions/side effects.   Please note  You were cared for by a hospitalist during your hospital stay. If you have any questions about your discharge medications or the care you received while you were in the hospital after you are discharged, you can call the unit and asked to speak with the hospitalist on call if the hospitalist that took care of you is not available. Once you are discharged, your primary care physician will handle any further medical issues. Please note that NO REFILLS for any discharge medications will be authorized once you are discharged, as it is imperative that you return to your primary care physician (or establish a relationship with a primary care physician if you do not have one) for your aftercare needs so that they can reassess your need for medications and monitor your lab values.

## 2017-01-29 NOTE — Progress Notes (Signed)
Pts BP in the low 100's morning dose of Lisinopril help. MD Allena Katz notified. No new orders.

## 2017-01-29 NOTE — Progress Notes (Signed)
Physical Therapy Treatment Patient Details Name: Lori Brady MRN: [id number] DOB: 02/18/1946 Today's Date: 01/29/2017    History of Present Illness 71 yo female admitted to [facility] w/ a fall and R hip pain, imaging determined a nondisplaced fracture of the R acetabulum and L superior pubic rami. PMH includes; COPD, arthritis, asthma, Chron's disease, Emphyseyma, heart attack, stroke, HTN and frequent UTIs    PT Comments    Pt willing to participate in PT session and demonstrated good participation and effort throughout. Pt sill experiencing pain in R hip rated as 6/10 after receiving medications.Provided further educated patient on performing therapeutic exercises while lying in bed to strengthen R LE. Pt still requires mod assist to perform bed mobility and scooting but was able to transfer to standing w/ RW, minimal assist and verbal cues for safe technique. Pt severely limited in ambulation and is unable to lift up either LE to take steps, instead performs a shuffle scooting method, she was limited by pain and cardiopulmonary issues. Pt O2 dropped to 89% on 2 L/min during activity that improved to 93% on 3L/min, HR increased to 140's when ambulating to the chair and patient became fatigued, blood pressure was also a concern as patient became light headed during ambulation and required max assist to safely transfer to chair, BP assessed immediately after ambulation and was 80/40 but able to return up to 100/48 after sitting for a minute. HR also returned to low 100's after sitting. Pt was placed on 2L/min at end of session and O2 stayed at 92%, nursing and MD notified of findings. Overall patient is severely limited in safe functional mobility due to increased pain, decreased strength, and cardiopulmonary deficits, she will continue to benefit from skilled PT, SNF still appropriate when medically stable.    Follow Up Recommendations  SNF     Equipment Recommendations       Recommendations for  Other Services       Precautions / Restrictions Precautions Precautions: Fall Restrictions Weight Bearing Restrictions: Yes LUE Weight Bearing: Weight bearing as tolerated RLE Weight Bearing: Partial weight bearing RLE Partial Weight Bearing Percentage or Pounds: 25%    Mobility  Bed Mobility Overal bed mobility: Needs Assistance Bed Mobility: Supine to Sit     Supine to sit: Mod assist     General bed mobility comments: continues to requrie mod assist to advance R LE and bring trunk into upright position  Transfers Overall transfer level: Needs assistance   Transfers: Sit to/from Stand Sit to Stand: Min assist         General transfer comment: improved technique this morning, required cuing and reeducation for hand placement but able to transfer w/ B UE   Ambulation/Gait Ambulation/Gait assistance: Mod assist Ambulation Distance (Feet): 2 Feet Assistive device: Rolling walker (2 wheeled) Gait Pattern/deviations: Step-to pattern;Shuffle;Decreased step length - right;Decreased step length - left;Antalgic;Trunk flexed   Gait velocity interpretation: <1.8 ft/sec, indicative of risk for recurrent falls General Gait Details: pt ambulated to chair unable to lift legs fully off ground and shuffles B LE slowly, displays increased pain and fatigue, HR increased to 140's and O2 was in low 90% on 3 L/min, became slightly orthostatic in standing    Stairs            Wheelchair Mobility    Modified Rankin (Stroke Patients Only)       Balance Overall balance assessment: Needs assistance Sitting-balance support: Single extremity supported;Feet supported Sitting balance-Leahy Scale: Good Sitting balance - Comments:  requires UE support to maintain upright sitting posture  Postural control: Posterior lean Standing balance support: Bilateral upper extremity supported Standing balance-Leahy Scale: Fair Standing balance comment: requries hands on assist to maintain  standing balance, min assist and occasional mod, leans slighty posteriorly                     Cognition Arousal/Alertness: Awake/alert Behavior During Therapy: WFL for tasks assessed/performed Overall Cognitive Status: Within Functional Limits for tasks assessed                      Exercises General Exercises - Lower Extremity Ankle Circles/Pumps: AROM;Strengthening;Both;10 reps;Supine Quad Sets: AROM;Strengthening;Both;10 reps;Supine Gluteal Sets: AROM;Strengthening;10 reps;Supine Heel Slides: AROM;Strengthening;10 reps;Supine;Right Hip ABduction/ADduction: AROM;Strengthening;Right;10 reps;Supine Straight Leg Raises: AROM;Strengthening;Right;10 reps;Supine    General Comments        Pertinent Vitals/Pain Pain Assessment: 0-10 Pain Score: 6  Pain Location: R hip increases with activity Pain Descriptors / Indicators: Aching;Grimacing Pain Intervention(s): Limited activity within patient's tolerance;Monitored during session;Premedicated before session;Repositioned    Home Living                      Prior Function            PT Goals (current goals can now be found in the care plan section) Acute Rehab PT Goals Patient Stated Goal: Get stronger and return home PT Goal Formulation: With patient Time For Goal Achievement: 02/11/17 Potential to Achieve Goals: Good Progress towards PT goals: Progressing toward goals    Frequency    BID      PT Plan Current plan remains appropriate    Co-evaluation             End of Session Equipment Utilized During Treatment: Gait belt;Oxygen Activity Tolerance: Patient limited by fatigue;Patient limited by pain;Other (comment) (drop in blood pressure ) Patient left: in chair;with call bell/phone within reach;with chair alarm set Nurse Communication: Mobility status;Other (comment) (vitals) PT Visit Diagnosis: Unsteadiness on feet (R26.81);Muscle weakness (generalized) (M62.81);Difficulty in  walking, not elsewhere classified (R26.2);History of falling (Z91.81)     Time: 9509-3267 PT Time Calculation (min) (ACUTE ONLY): 34 min  Charges:                       G Codes:       [employer]  Student PT 01/29/2017, 12:47 PM

## 2017-01-29 NOTE — Progress Notes (Signed)
Patient is medically stable for D/C to [facility] today. Per Jomarie Longs [facility] liaison patient can come today to room 501. RN will call report to RN Elly Modena at [phone] and arrange EMS for transport. Clinical Child psychotherapist (CSW) sent D/C orders to [employer] via [facility] today. Patient is aware of above. Patient's sister Lucendia Herrlich is at bedside and aware of D/C today. Please reconsult if future social work needs arise. CSW signing off.   [employer], LCSW [phone]

## 2017-01-29 NOTE — Progress Notes (Signed)
Report called to [facility] at [facility], EMS called for transportation.

## 2017-01-29 NOTE — Progress Notes (Signed)
EMS here to transport pt. 

## 2017-02-02 ENCOUNTER — Other Ambulatory Visit: Payer: Self-pay | Admitting: Family Medicine

## 2017-02-02 DIAGNOSIS — R1312 Dysphagia, oropharyngeal phase: Secondary | ICD-10-CM

## 2017-02-03 NOTE — Progress Notes (Signed)
Pt discharged to [facility] on 01/29/2017. Medication reconciliation completed by PharmD with [facility] on 02/02/2017. No issues found. APAP/oxycodone scheduled three times a day putting patient at risk to receive > 4 grams of acetaminophen with added PRN doses. Recommendation was made to change APAP/oxycodone TID to oxycodone TID. Spoke with nurse Landry Mellow would left a note for rounding MD.   Sandi Carne, PharmD, BCPS Pharmacy Resident Pager: [phone]

## 2017-02-11 ENCOUNTER — Other Ambulatory Visit: Payer: Self-pay | Admitting: Family Medicine

## 2017-02-11 ENCOUNTER — Ambulatory Visit
Admission: RE | Admit: 2017-02-11 | Discharge: 2017-02-11 | Disposition: A | Discharge status: Home / Self Care | Payer: Medicare Other | Source: Ambulatory Visit | Attending: Family Medicine | Admitting: Family Medicine

## 2017-02-11 DIAGNOSIS — R1312 Dysphagia, oropharyngeal phase: Secondary | ICD-10-CM | POA: Diagnosis present

## 2017-02-11 NOTE — Progress Notes (Signed)
Pt needs referral to speech pathology, r/t aspiration of  thin liquids

## 2017-02-11 NOTE — Therapy (Addendum)
[facility] DIAGNOSTIC RADIOLOGY [address] [place], [place], [address] Phone: [phone]   Fax:     Modified Barium Swallow  Patient Details  Name: Lori Brady MRN: [id number] Date of Birth: 1946-05-16 No Data Recorded  Encounter Date: 02/11/2017      End of Session - 02/11/17 1529    Visit Number 1   Number of Visits 1   Date for SLP Re-Evaluation 02/11/17   SLP Start Time 1300   SLP Stop Time  1400   SLP Time Calculation (min) 60 min   Activity Tolerance Patient tolerated treatment well      Past Medical History:  Diagnosis Date  . Arthritis   . Asthma   . Crohn's disease (HCC)   . Emphysema of lung (HCC)   . GERD (gastroesophageal reflux disease)   . History of chicken pox   . History of heart attack   . Hx of completed stroke   . Hypercholesteremia   . Hypertension   . Pneumonia   . Seasonal allergies   . Sepsis (HCC)   . UTI (lower urinary tract infection)     Past Surgical History:  Procedure Laterality Date  . BREAST BIOPSY Bilateral 1960's to 90's    There were no vitals filed for this visit.     Subjective: Patient behavior: (alertness, ability to follow instructions, etc.): pt alert, followed instruction w/ cues. Verbally conversive but min vague in ability to give detailed information re: medical events and diagnosis. She stated she was here today "because I do what I am told". Unsure of baseline Cognitive status. Pt does have a h/o GERD and Vocal Cord dysfunction per chart notes. Chief complaint: dysphagia   Objective:  Radiological Procedure: A videoflouroscopic evaluation of oral-preparatory, reflex initiation, and pharyngeal phases of the swallow was performed; as well as a screening of the upper esophageal phase.  I. POSTURE: upright II. VIEW: lateral III. COMPENSATORY STRATEGIES: min chin tuck; breath hold attempted; head turn; NO Straws IV. BOLUSES ADMINISTERED:   Thin Liquid: 3  trials  Nectar-thick Liquid: 4 trials  Honey-thick Liquid: NT  Puree: 3 trials  Mechanical Soft: 1 trial V. RESULTS OF EVALUATION: A. ORAL PREPARATORY PHASE: (The lips, tongue, and velum are observed for strength and coordination)       **Overall Severity Rating: MILD. Slight-min decreased bolus cohesion and control w/ trials of thin liquids resulting in premature spillage into the pharynx  B. SWALLOW INITIATION/REFLEX: (The reflex is normal if "triggered" by the time the bolus reached the base of the tongue)  **Overall Severity Rating: MODERATE+. Delayed pharyngeal swallow initiation more so w/ thin liquid trials resulting in laryngeal Penetration and Aspiration of thin liquid consistencies - Penetration and Aspiration were SILENT  C. PHARYNGEAL PHASE: (Pharyngeal function is normal if the bolus shows rapid, smooth, and continuous transit through the pharynx and there is no pharyngeal residue after the swallow)  **Overall Severity Rating: [facility]. Adequate pharyngeal clearing of bolus residue along BOT and w/in the pharynx indicating adequate laryngeal excursion and pharyngeal pressure during the swallow  D. LARYNGEAL PENETRATION: (Material entering into the laryngeal inlet/vestibule but not aspirated): deep x2 trials of thin liquids despite attempts at using swallow strategies - SILENT in nature   E. ASPIRATION: x1 trial of thin liquid - SILENT in nature F. ESOPHAGEAL PHASE: (Screening of the upper esophagus): WFL  ASSESSMENT: Pt appears to present w/ Moderate+ pharyngeal phase dysphagia; Mild oral phase dysphagia. Pt exhibited a delayed pharyngeal  swallow initiation resulting in SILENT laryngeal Penetration of thin liquids via Cup BEFORE the swallow - min+ amount of thin liquid spilled from the valleculae to the pyriform sinuses b/f adequate airway closure. This was consistent w/ all trials of thin liquids via Cup despite strategies attempted. During 1 trial of thin liquid via Cup, SILENT  Aspiration occurred DURING the swallow post the laryngeal Penetration occurring. Of note, pt required verbal cues and support during attempts w/ strategies - she may need more hands-on training/feedback time for further learning of such to aid accuracy and consistency. When pt was given Cup trials of Nectar consistency liquids, w/ no laryngeal Penetration or Aspiration occured; similar presentation was noted w/ trials of puree and soft solids - timing of the pharyngeal swallow initiation was more appropriate. During the oral phase, pt exhibited min decreased bolus control of thin liquids w/ premature spillage; adequate bolus control and coordination w/ appropriate oral clearing noted w/ all other consistencies assessed. NO pharyngeal residue was noted post swallows indicated adequate pharyngeal pressure and laryngeal excursion during swallowing. No overt Esophageal phase deficits noted in the cervical Esophagus.  Pt is at increased risk for Pulmonary declined d/t sequelae of aspiration in light of the SILENT nature of the penetration and aspiration (reduced awareness of episodes) as well as pt's reduced cough effectiveness (for added protection of the airway) secondary to her Vocal Cord dysfunction as reported by pt and noted in chart. Recommend f/u w/ ST services; ENT; Neurology; GI.    PLAN/RECOMMENDATIONS:  A. Diet: Dysphagia level 3(mech soft) diet; NECTAR consistency liquid via CUP - NO Straw.   B. Swallowing Precautions: Aspiration precautions; Pills given in PUREE(whole); strong throat clear/cough strategy post any oral intake.  C. Recommended consultation to: ENT consult for clarification of Vocal Cord paresis/paralysis as reported in chart and by pt )consider any options for medialization if appropriate); Dietitian f/u; Neurology consult for further information re: h/o CVA(s); GI consult for f/u management of GERD.  D. Therapy recommendations: dysphagia tx to focus on learning, and attempting to  utilize, the super-supraglottic swallow; follow through w/ aspiration precautions; monitoring for sequelae of aspiration secondary to the SILENT nature of the Penetration and Aspiration.  E. Results and recommendations were discussed w/ patient; video viewed and discussed w/ patient post study.            Patient will benefit from skilled therapeutic intervention in order to improve the following deficits and impairments:   Oropharyngeal dysphagia - Plan: DG OP Swallowing Func-Medicare/Speech Path, DG OP Swallowing Func-Medicare/Speech Path, CANCELED: DG SWALLOWING FUNC-SPEECH PATHOLOGY, CANCELED: DG SWALLOWING FUNC-SPEECH PATHOLOGY      G-Codes - 02-25-2017 1531    Functional Assessment Tool Used clinical judgement   Functional Limitations Swallowing   Swallow Current Status (Z6109) At least 40 percent but less than 60 percent impaired, limited or restricted   Swallow Goal Status (U0454) At least 40 percent but less than 60 percent impaired, limited or restricted   Swallow Discharge Status [phone]) At least 40 percent but less than 60 percent impaired, limited or restricted          Problem List Patient Active Problem List   Diagnosis Date Noted  . Hip fracture (HCC) 01/26/2017  . Septic shock (HCC) 10/08/2016  . COPD (chronic obstructive pulmonary disease) with chronic bronchitis (HCC) 08/12/2016  . Asthma without status asthmaticus 03/31/2016  . Osteoporosis 03/31/2016  . Elevated LFTs 09/17/2015  . Parotid swelling 09/13/2015  . COPD exacerbation (HCC) 09/13/2015  . Shortness  of breath 09/13/2015  . Urticaria 09/13/2015  . Cerebrovascular accident (CVA) (HCC) 08/21/2015  . Senile purpura (HCC) 08/21/2015  . Atrial tachycardia (HCC) 08/13/2015  . Eczema 07/10/2015  . Allergic rhinitis 06/26/2015  . Hyperkalemia 05/03/2015  . Anxiety 04/23/2015  . Edema leg 03/27/2015  . Fatigue 03/06/2015  . Primary cardiomyopathy (HCC) 03/05/2015  . Benign essential HTN  02/28/2015  . Combined fat and carbohydrate induced hyperlipemia 09/06/2014  . COPD, severe (HCC) 08/01/2014  . Acid reflux 08/01/2014  . Cough 08/01/2014    Myli Pae 02/11/2017, 3:35 PM  [facility] DIAGNOSTIC RADIOLOGY [address] [place], [place], [address] Phone: [phone]   Fax:     Name: Lori Brady MRN: [id number] Date of Birth: 16-Apr-1946

## 2017-02-12 ENCOUNTER — Telehealth: Payer: Self-pay

## 2017-02-12 NOTE — Telephone Encounter (Signed)
A user error has taken place.

## 2017-02-24 ENCOUNTER — Telehealth: Payer: Self-pay | Admitting: Physician Assistant

## 2017-03-02 ENCOUNTER — Encounter: Payer: Self-pay | Admitting: Physician Assistant

## 2017-03-02 ENCOUNTER — Ambulatory Visit (INDEPENDENT_AMBULATORY_CARE_PROVIDER_SITE_OTHER): Payer: Medicare Other | Admitting: Physician Assistant

## 2017-03-02 VITALS — BP 122/64 | HR 88 | Temp 99.1°F | Resp 16 | Wt 143.0 lb

## 2017-03-02 DIAGNOSIS — K2100 Gastro-esophageal reflux disease with esophagitis, without bleeding: Secondary | ICD-10-CM

## 2017-03-02 DIAGNOSIS — J449 Chronic obstructive pulmonary disease, unspecified: Secondary | ICD-10-CM | POA: Diagnosis not present

## 2017-03-02 DIAGNOSIS — F419 Anxiety disorder, unspecified: Secondary | ICD-10-CM | POA: Diagnosis not present

## 2017-03-02 DIAGNOSIS — S72001D Fracture of unspecified part of neck of right femur, subsequent encounter for closed fracture with routine healing: Secondary | ICD-10-CM

## 2017-03-02 DIAGNOSIS — I10 Essential (primary) hypertension: Secondary | ICD-10-CM

## 2017-03-02 DIAGNOSIS — Z09 Encounter for follow-up examination after completed treatment for conditions other than malignant neoplasm: Secondary | ICD-10-CM | POA: Diagnosis not present

## 2017-03-02 NOTE — Progress Notes (Signed)
Patient: Lori Brady Female    DOB: November 13, 1946   71 y.o.   MRN: [id number] Visit Date: 03/02/2017  Today's Provider: Trey Sailors, PA-C   Chief Complaint  Patient presents with  . Transitions Of Care  . Hip Injury  . COPD   Subjective:    HPI   Lori Brady is a 71 y/o woman with history of HTN, CVA with resultant vocal cord paralysis, and COPD presenting today for a hospital follow up for a nondisplaced fracture of her right acetabulum. She is here with here sister today.  This occurred on 01/26/2017 when she tried to pick something up and lost her balance. She presented to the emergency room where her labs were significant for BAL of 173 and transaminitis, though these had improved from labs one month prior. XRAY and CT of hip revealed "nondisplaced fracture of anterior column of right acetabulum as well as nondisplaced fracture of left pubic ramus." She was placed on CIWA protocol briefly but taken off shortly after with no ill effects. She reports she has only had two drinks and does not have a drinking problem. She was seen in consult by orthopedist Dr. Martha Clan who recommended non-operative management with outpatient follow up in two weeks.  Her stay was complicated by a COPD exacerbation and possible pneumonia, thus she was placed on azithromcyin 500 mg PO QD x 5 days and ceftin 500 mg BID x 6 days.  On 01/29/2017 the day of her discharge, the  patient was found to be severely deconditioned and recommended for a skilled nursing facility. She was admitted to [facility] on 01/29/2017 and discharged 02/26/2017. She was on Lovenox for DVT prophylaxis for 3 weeks, which she has since completed.   She reports her stay at the rehabilitation went as well as could be expected. While there, she was referred to a swallow study for dysphagia. Recommendations from the SLP report include the following: referral to SLP for swallowing/compensatory techniques for silent aspiration,  mechanical soft diet, nectar thick liquids, whole pills in puree. The patient is followed by Dr. Andee Poles at ENT for vocal cord paralysis. She sees her pulmonologist Dr. Welton Flakes at [facility]. She has recently missed her appt due to being in rehabilitation. She takes prilosec 20 mg daily for heartburn.   She reports speech and physical therapy will be coming to her house to work with her, she doesn't know when yet. She is currently 25% weight bearing on her right foot per ortho instructions. She has not followed up with orthopedics yet because she didn't know she had to. She is ambulating well with a walker today. She is currently taking oxy-APAP 10-325 mg, one pill, three times daily which is different from the script that reads 1 pill every 12 hours as needed. There is another script for oxy-APAP 5-325 mg 1-2 pills every 4 hours as needed which she reports she is not taking. She also has prescriptions for Lorazepam, hydroxyzine, and doxepin.   Additionally, She reports providers at [facility] put her on HCTZ, which she discontinued since her BP was good at home and is now taking only Lisinopril 20 mg daily.    Allergies  Allergen Reactions  . Levaquin [Levofloxacin] Nausea Only  . Vicodin [Hydrocodone-Acetaminophen] Nausea Only     Current Outpatient Prescriptions:  .  lisinopril (PRINIVIL,ZESTRIL) 20 MG tablet, Take by mouth., Disp: , Rfl:  .  albuterol (ACCUNEB) 0.63 MG/3ML nebulizer solution, Take 1  ampule by nebulization every 6 (six) hours as needed for wheezing., Disp: , Rfl:  .  albuterol (PROVENTIL HFA;VENTOLIN HFA) 108 (90 Base) MCG/ACT inhaler, Inhale 2 puffs into the lungs every 4 (four) hours as needed for wheezing or shortness of breath., Disp: , Rfl:  .  alendronate (FOSAMAX) 70 MG tablet, Take 1 tablet (70 mg total) by mouth once a week., Disp: 12 tablet, Rfl: 4 .  ALPRAZolam (XANAX) 0.5 MG tablet, Take 0.5 mg by mouth at bedtime as needed for anxiety., Disp: , Rfl:    .  aspirin 81 MG tablet, Take 81 mg by mouth daily., Disp: , Rfl:  .  betamethasone valerate (VALISONE) 0.1 % cream, APPLY TOPICALLY TO AFFECTED AREA SPARINGLY 2 TIMES DAILY., Disp: 15 g, Rfl: 5 .  chlorthalidone (HYGROTON) 25 MG tablet, Take 1 tablet (25 mg total) by mouth daily., Disp: 30 tablet, Rfl: 0 .  Cholecalciferol (VITAMIN D) 2000 UNITS CAPS, Take 1 capsule by mouth daily., Disp: , Rfl:  .  citalopram (CELEXA) 20 MG tablet, Take 1 tablet (20 mg total) by mouth daily. (Patient taking differently: Take 20 mg by mouth at bedtime. ), Disp: 90 tablet, Rfl: 1 .  docusate sodium (COLACE) 100 MG capsule, Take 1 capsule (100 mg total) by mouth 2 (two) times daily., Disp: 10 capsule, Rfl: 0 .  doxepin (SINEQUAN) 25 MG capsule, Take 25 mg by mouth at bedtime. , Disp: , Rfl:  .  enoxaparin (LOVENOX) 40 MG/0.4ML injection, Inject 0.4 mLs (40 mg total) into the skin daily., Disp: 0 Syringe, Rfl:  .  Fluticasone Furoate-Vilanterol (BREO ELLIPTA) 100-25 MCG/INH AEPB, Inhale 1 puff into the lungs daily. , Disp: , Rfl:  .  guaiFENesin (MUCINEX) 600 MG 12 hr tablet, Take 1 tablet (600 mg total) by mouth 2 (two) times daily., Disp: , Rfl:  .  hydrOXYzine (ATARAX/VISTARIL) 10 MG tablet, Take 10-20 mg by mouth at bedtime., Disp: , Rfl:  .  LORazepam (ATIVAN) 0.5 MG tablet, Take 1 tablet (0.5 mg total) by mouth daily as needed for anxiety., Disp: 30 tablet, Rfl: 0 .  montelukast (SINGULAIR) 10 MG tablet, Take 1 tablet (10 mg total) by mouth daily., Disp: 90 tablet, Rfl: 1 .  Omega-3 Fatty Acids (FISH OIL) 1000 MG CAPS, Take 1 capsule by mouth daily., Disp: , Rfl:  .  oxyCODONE-acetaminophen (PERCOCET/ROXICET) 5-325 MG tablet, Take 1-2 tablets by mouth every 4 (four) hours as needed for moderate pain., Disp: 30 tablet, Rfl: 0 .  potassium chloride SA (K-DUR,KLOR-CON) 20 MEQ tablet, Take 20 mEq by mouth daily., Disp: , Rfl:  .  predniSONE (STERAPRED UNI-PAK 21 TAB) 10 MG (21) TBPK tablet, Start at 60mg  taper by  10mg  until complete, Disp: 21 tablet, Rfl: 0 .  simvastatin (ZOCOR) 40 MG tablet, TAKE 1 TABLET BY MOUTH AT BEDTIME., Disp: 90 tablet, Rfl: 3 .  Tiotropium Bromide Monohydrate (SPIRIVA RESPIMAT) 2.5 MCG/ACT AERS, Inhale 2 puffs into the lungs daily., Disp: , Rfl:   Review of Systems  Constitutional: Negative.   Respiratory: Positive for cough, shortness of breath and wheezing. Negative for apnea, choking, chest tightness and stridor.        Chronic COPD; pt reports this is stable.  Cardiovascular: Negative.   Gastrointestinal: Negative.   Musculoskeletal: Positive for arthralgias and gait problem (Pt walks with a walker). Negative for back pain, joint swelling, myalgias, neck pain and neck stiffness.  Neurological: Negative for dizziness, light-headedness, numbness and headaches.    Social History  Substance  Use Topics  . Smoking status: Former Smoker    Packs/day: 1.00    Years: 30.00    Types: Cigarettes    Quit date: 08/01/2008  . Smokeless tobacco: Never Used  . Alcohol use Yes     Comment: occasional   Objective:   BP 122/64 (BP Location: Left Arm, Patient Position: Sitting, Cuff Size: Normal)   Pulse 88   Temp 99.1 F (37.3 C) (Oral)   Resp 16   Wt 143 lb (64.9 kg)   BMI 25.33 kg/m  Vitals:   03/02/17 1011  BP: 122/64  Pulse: 88  Resp: 16  Temp: 99.1 F (37.3 C)  TempSrc: Oral  Weight: 143 lb (64.9 kg)     Physical Exam  Constitutional: She is oriented to person, place, and time. She appears well-developed and well-nourished.  Cardiovascular: Normal rate and regular rhythm.   Pulmonary/Chest: Effort normal and breath sounds normal.  Abdominal: Soft. Bowel sounds are normal.  Musculoskeletal: Normal range of motion.  Walks with a walker.   Neurological: She is alert and oriented to person, place, and time. She has normal reflexes.  Skin: Skin is warm and dry.  Psychiatric: She has a normal mood and affect. Her behavior is normal. Judgment and thought content  normal.        Assessment & Plan:     1. Hospital discharge follow-up  Reviewed hospital D/C summary, CT hip, swallow study. Have put in referral for patient to follow up with Dr. Martha Clan in ortho. Should be starting PT and speech therapy soon. Have reviewed amount of sedating medications that patient is on, including Lorazepam, doxepin, hydroxyzine, and percocet. Patient agrees to try cutting 0.5mg  Ativan in half. She also agrees to try taking 5-325 percocet every 8 hours vs. The 10-325mg  she is currently taking. Have warned patient about sedation risks. I have reconciled her hospital discharge medications with her current medications.  2. COPD, severe (HCC)  Sees Dr. Welton Flakes, doing well on inhalers. Not currently on abx or steroids.   3. Benign essential HTN Controlled on Lisinopril. Will recheck next visit.   4. Closed fracture of right hip with routine healing, subsequent encounter  Placed referral for ortho. Patient currently 25% weight bearing, soon to start PT. Taper off pain medications.  - Ambulatory referral to Orthopedic Surgery  5. Gastroesophageal reflux disease with esophagitis  Prilosec 20 mg daily.  6. Anxiety  On Celexa and Ativan. Would like patient to taper Ativan 2/2 sedating medications.  Return in about 1 month (around 04/01/2017).  Will see her in one month for AWV.  The entirety of the information documented in the History of Present Illness, Review of Systems and Physical Exam were personally obtained by me. Portions of this information were initially documented by Kavin Leech, CMA and reviewed by me for thoroughness and accuracy.          Trey Sailors, PA-C  [facility]

## 2017-03-02 NOTE — Patient Instructions (Signed)
Vocal Cord Paralysis Vocal cord paralysis is the inability of one or both of your vocal cords to move properly because the muscles are paralyzed. The vocal cords are two elastic muscles located inside the voice box (larynx). When you breathe in (inhale), your vocal cords open wide to let air pass into your lungs. When you eat or swallow, your vocal cords close tightly to keep food or liquids from passing into your lungs. When you speak, your vocal cords come close together and vibrate to create sound. In most cases, vocal cord paralysis affects only one vocal cord (unilateral). Rarely, both vocal cords are affected (bilateral). What are the causes? This condition may be caused by:  Damage to a nerve that controls vocal cord movement.  Neck or chest injury.  Cancer of the larynx, neck, brain, or chest.  Stroke.  Nervous system diseases.  Viral infections. Sometimes the cause is not known (idiopathic). What are the signs or symptoms? Symptoms of this condition depend on whether one or both vocal cords are affected and where they are affected. Signs and symptoms include:  Voice changes, such as:  The inability to speak loudly.  Having limited variety of high and low sounds (pitch).  Having limited variety in loudness.  A voice that lasts for a short time (about 1 second).  Trouble breathing.  Hoarseness.  Weak, breathy voice.  Noisy breathing.  Choking and coughing while eating or drinking (aspiration). This can lead to a lung infection (pneumonia). How is this diagnosed? This condition may be diagnosed based on:  Medical history.  A physical exam.  Other tests to confirm the diagnosis, such as:  An exam of your vocal cords with a lighted, flexible scope (endoscopic laryngoscopy).  An exam that measures the electrical currents in the nerves and muscles of your larynx (laryngeal electromyogram).  Blood tests.  X-rays.  Imaging studies, such as an MRI or CT  scan. How is this treated? Treatment for this condition depends on the type of paralysis you have. Treatment may include:  Speech therapy.  Injecting a paralyzed vocal cord with a substance to increase its size.  Surgery to move one cord close to the other.  Muscle and nerve transplants. Follow these instructions at home:  Eat and drink slowly.  Avoid very hot or very cold food or drinks.  Rest your voice. Avoid speaking too much, too long, or too loudly. Do not whisper.  Do not use any products that contain nicotine or tobacco, such as cigarettes and e-cigarettes. If you need help quitting, ask your health care provider.  Keep all follow-up visits as told by your health care provider. This is important. This includes any speech therapy visits. Contact a health care provider if:  Your voice is weaker or more hoarse.  Your breathing is getting noisy.  You cough or choke while eating or swallowing.  You have a cough and a fever. Get help right away if:  You have trouble breathing.  You have chest pain. Summary  Vocal cord paralysis is the inability of one or both of your vocal cords to move properly because the muscles are paralyzed.  Common symptoms include changes in your voice, trouble breathing, hoarseness, noisy breathing, and choking and coughing while eating or drinking.  Paralysis is diagnosed by medical history, physical exam, blood tests, an exam of the vocal cords (laryngoscopy), MRI, and other tests.  Treatment includes speech therapy, injections, surgery, and muscle or nerve implants. This information is not intended to  replace advice given to you by your health care provider. Make sure you discuss any questions you have with your health care provider. Document Released: 09/10/2004 Document Revised: 10/12/2016 Document Reviewed: 10/12/2016 Elsevier Interactive Patient Education  2017 [employer].

## 2017-03-04 ENCOUNTER — Other Ambulatory Visit: Payer: Self-pay | Admitting: Internal Medicine

## 2017-03-04 ENCOUNTER — Ambulatory Visit
Admission: RE | Admit: 2017-03-04 | Discharge: 2017-03-04 | Disposition: A | Discharge status: Home / Self Care | Payer: Medicare Other | Source: Ambulatory Visit | Attending: Internal Medicine | Admitting: Internal Medicine

## 2017-03-04 DIAGNOSIS — J449 Chronic obstructive pulmonary disease, unspecified: Secondary | ICD-10-CM | POA: Diagnosis present

## 2017-03-04 DIAGNOSIS — R918 Other nonspecific abnormal finding of lung field: Secondary | ICD-10-CM | POA: Diagnosis not present

## 2017-03-11 ENCOUNTER — Telehealth: Payer: Self-pay | Admitting: Physician Assistant

## 2017-03-11 NOTE — Telephone Encounter (Signed)
Barnetta Chapel with [facility] is calling regarding [facility],  Pt saw her surgeon yesterday and he gave her a pain medication (Tramadol) and pt states that this is not controlling her pain.    Please advise  [phone]  Thanks Con Memos

## 2017-03-11 NOTE — Telephone Encounter (Signed)
Home health nurse will check with the surgeon since he prescribed this yesterday.  ED

## 2017-03-12 ENCOUNTER — Telehealth: Payer: Self-pay | Admitting: Physician Assistant

## 2017-03-12 NOTE — Telephone Encounter (Signed)
Please review-aa 

## 2017-03-12 NOTE — Telephone Encounter (Signed)
ok 

## 2017-03-12 NOTE — Telephone Encounter (Signed)
Lori Brady

## 2017-03-12 NOTE — Telephone Encounter (Signed)
JoAnna with [facility] is requesting a verbal order for speech therapy 1 time a week for 4 weeks.  This will need to come from Dr Rosanna Randy.  HI#[phone]/WK

## 2017-03-15 ENCOUNTER — Telehealth: Payer: Self-pay | Admitting: Physician Assistant

## 2017-03-15 NOTE — Telephone Encounter (Signed)
Pt is having pain in left hip and wants to know if you can give her something for pain.  She has been prescribed tramidol and prednisone and it does not help  She uses [facility].

## 2017-03-15 NOTE — Telephone Encounter (Signed)
Pt advised. She is going to give him a call.  Thanks,   -Vernona Rieger

## 2017-03-15 NOTE — Telephone Encounter (Signed)
Have reviewed her note with Dr. Martha Clan on 03/08/2017 and he increased her right sided weight bearing up to 50% with the instructions that she should use pain as her guide for weight bearing as she may not be able to tolerate this. If she is having pain not relieved by tramadol, this could be secondary to too much weight bearing right now. She should call Dr. Samuel Germany office for further instructions on activity modification and pain mgmt. Thank you!

## 2017-03-17 ENCOUNTER — Telehealth: Payer: Self-pay | Admitting: Physician Assistant

## 2017-03-17 ENCOUNTER — Other Ambulatory Visit: Payer: Self-pay | Admitting: Physician Assistant

## 2017-03-17 DIAGNOSIS — F419 Anxiety disorder, unspecified: Secondary | ICD-10-CM

## 2017-03-17 MED ORDER — LORAZEPAM 0.5 MG PO TABS: 0.5000 mg | ORAL_TABLET | Freq: Every day | ORAL | 0 refills | Status: DC | PRN

## 2017-03-17 NOTE — Telephone Encounter (Signed)
Pt declined from a MWE.  She said she just got out of the hospital and thinks she will wait until next year for the mwe.  Thanks Con Memos

## 2017-03-17 NOTE — Telephone Encounter (Signed)
Called Pt to schedule AWV with [facility] - knb

## 2017-03-17 NOTE — Telephone Encounter (Signed)
Sent in Lorazepam as phone in Rx to [facility]. Would like [place] to continue to work on weaning off of this as tolerated as she is on a lot of sedating medications.

## 2017-03-17 NOTE — Telephone Encounter (Signed)
[facility] faxed a request for the following medication. Thanks CC  LORazepam (ATIVAN) 0.5 MG tablet

## 2017-03-17 NOTE — Telephone Encounter (Signed)
RX called in at [facility]. Patient advised to continue trying to wean off medication.

## 2017-03-19 ENCOUNTER — Telehealth: Payer: Self-pay

## 2017-03-19 NOTE — Telephone Encounter (Signed)
Barnetta Chapel from [facility] called just to let us know that they have tried to reach out to the patient and have not been able to talk to her and they will continue to try. She just needed to inform us-aa

## 2017-03-19 NOTE — Telephone Encounter (Signed)
Noted, thank you

## 2017-03-26 ENCOUNTER — Encounter: Payer: Self-pay | Admitting: Physician Assistant

## 2017-04-01 ENCOUNTER — Encounter: Payer: Self-pay | Admitting: Physician Assistant

## 2017-04-01 ENCOUNTER — Ambulatory Visit (INDEPENDENT_AMBULATORY_CARE_PROVIDER_SITE_OTHER): Payer: Medicare Other | Admitting: Physician Assistant

## 2017-04-01 VITALS — BP 102/60 | HR 72 | Temp 98.6°F | Resp 16 | Wt 140.0 lb

## 2017-04-01 DIAGNOSIS — Z23 Encounter for immunization: Secondary | ICD-10-CM | POA: Diagnosis not present

## 2017-04-01 DIAGNOSIS — Z1231 Encounter for screening mammogram for malignant neoplasm of breast: Secondary | ICD-10-CM

## 2017-04-01 DIAGNOSIS — Z1239 Encounter for other screening for malignant neoplasm of breast: Secondary | ICD-10-CM

## 2017-04-01 DIAGNOSIS — M810 Age-related osteoporosis without current pathological fracture: Secondary | ICD-10-CM

## 2017-04-01 DIAGNOSIS — Z0000 Encounter for general adult medical examination without abnormal findings: Secondary | ICD-10-CM | POA: Diagnosis not present

## 2017-04-01 NOTE — Progress Notes (Signed)
Patient: Lori Brady, Female    DOB: 10-14-1946, 71 y.o.   MRN: [id number] Visit Date: 04/01/2017  Today's Provider: Trey Sailors, PA-C   Chief Complaint  Patient presents with  . Medicare Wellness   Subjective:    Annual wellness visit Lori Brady is a 71 y.o. female. She feels well. She reports exercising regularly.  Pt is doing regular physical therapy.   She reports she is sleeping fairly well.  She has recently seen Dr. Martha Clan for follow up of new leg pain after hip fracture. Lumbar MRI did not reveal nerve compression but did reveal vertebral compression fracture.   Also, She had a DEXA in 2017 with T-score of -2.99, worse from 2014 when the T-score was -2.55. She was taking 70 mg Fosamax for two years in 2012 or so and then stopped, she doesn't know why. She has resumed taking Fosamax for the past 8-9 months she says religiously.  Mammogram 2017 was normal. Colonoscopy 2011 normal.   She needs tetanus update today.  Dr. Welton Flakes - pulmonology Dr. Gwen Pounds - cardiology Dr. Martha Clan - orthopedics Dr. Jesusita Oka in [place] for dermatology Dr. Randon Goldsmith - opthalmology    -----------------------------------------------------------   Review of Systems  Constitutional: Negative.   HENT: Positive for hearing loss, nosebleeds, sinus pressure and tinnitus.   Eyes: Positive for photophobia.  Respiratory: Positive for cough and wheezing. Negative for apnea, choking, chest tightness, shortness of breath and stridor.   Cardiovascular: Negative.   Gastrointestinal: Negative.   Endocrine: Negative.   Genitourinary: Negative.   Musculoskeletal: Negative.   Skin: Positive for rash.  Allergic/Immunologic: Negative.   Neurological: Negative.   Hematological: Negative.   Psychiatric/Behavioral: Negative.     Social History   Social History  . Marital status: Widowed    Spouse name: N/A  . Number of children: N/A  . Years of education: N/A   Occupational  History  . Not on file.   Social History Main Topics  . Smoking status: Former Smoker    Packs/day: 1.00    Years: 30.00    Types: Cigarettes    Quit date: 08/01/2008  . Smokeless tobacco: Never Used  . Alcohol use Yes     Comment: occasional  . Drug use: No  . Sexual activity: Not on file   Other Topics Concern  . Not on file   Social History Narrative  . No narrative on file    Past Medical History:  Diagnosis Date  . Arthritis   . Asthma   . Crohn's disease (HCC)   . Emphysema of lung (HCC)   . GERD (gastroesophageal reflux disease)   . History of chicken pox   . History of heart attack   . Hx of completed stroke   . Hypercholesteremia   . Hypertension   . Pneumonia   . Seasonal allergies   . Sepsis (HCC)   . UTI (lower urinary tract infection)      Patient Active Problem List   Diagnosis Date Noted  . Hip fracture (HCC) 01/26/2017  . Septic shock (HCC) 10/08/2016  . COPD (chronic obstructive pulmonary disease) with chronic bronchitis (HCC) 08/12/2016  . Asthma without status asthmaticus 03/31/2016  . Osteoporosis 03/31/2016  . Elevated LFTs 09/17/2015  . Parotid swelling 09/13/2015  . COPD exacerbation (HCC) 09/13/2015  . Shortness of breath 09/13/2015  . Urticaria 09/13/2015  . Cerebrovascular accident (CVA) (HCC) 08/21/2015  . Senile purpura (HCC) 08/21/2015  . Atrial  tachycardia (HCC) 08/13/2015  . Eczema 07/10/2015  . Allergic rhinitis 06/26/2015  . Hyperkalemia 05/03/2015  . Anxiety 04/23/2015  . Edema leg 03/27/2015  . Fatigue 03/06/2015  . Primary cardiomyopathy (HCC) 03/05/2015  . Benign essential HTN 02/28/2015  . Combined fat and carbohydrate induced hyperlipemia 09/06/2014  . COPD, severe (HCC) 08/01/2014  . Acid reflux 08/01/2014  . Cough 08/01/2014    Past Surgical History:  Procedure Laterality Date  . BREAST BIOPSY Bilateral 1960's to 90's    Her family history includes Breast cancer (age of onset: 35) in her sister; Breast  cancer (age of onset: 3) in her sister; Cancer in her brother, father, mother, sister, and sister; Diabetes in her maternal grandmother; Heart disease in her brother, father, sister, and sister.      Current Outpatient Prescriptions:  .  albuterol (ACCUNEB) 0.63 MG/3ML nebulizer solution, Take 1 ampule by nebulization every 6 (six) hours as needed for wheezing., Disp: , Rfl:  .  albuterol (PROVENTIL HFA;VENTOLIN HFA) 108 (90 Base) MCG/ACT inhaler, Inhale 2 puffs into the lungs every 4 (four) hours as needed for wheezing or shortness of breath., Disp: , Rfl:  .  alendronate (FOSAMAX) 70 MG tablet, Take 1 tablet (70 mg total) by mouth once a week., Disp: 12 tablet, Rfl: 4 .  aspirin 81 MG tablet, Take 81 mg by mouth daily., Disp: , Rfl:  .  betamethasone valerate (VALISONE) 0.1 % cream, APPLY TOPICALLY TO AFFECTED AREA SPARINGLY 2 TIMES DAILY., Disp: 15 g, Rfl: 5 .  chlorthalidone (HYGROTON) 25 MG tablet, Take 1 tablet (25 mg total) by mouth daily., Disp: 30 tablet, Rfl: 0 .  Cholecalciferol (VITAMIN D) 2000 UNITS CAPS, Take 1 capsule by mouth daily., Disp: , Rfl:  .  citalopram (CELEXA) 20 MG tablet, Take 1 tablet (20 mg total) by mouth daily. (Patient taking differently: Take 20 mg by mouth at bedtime. ), Disp: 90 tablet, Rfl: 1 .  doxepin (SINEQUAN) 25 MG capsule, Take 25 mg by mouth at bedtime. , Disp: , Rfl:  .  guaiFENesin (MUCINEX) 600 MG 12 hr tablet, Take 1 tablet (600 mg total) by mouth 2 (two) times daily., Disp: , Rfl:  .  hydrOXYzine (ATARAX/VISTARIL) 10 MG tablet, Take 10-20 mg by mouth at bedtime., Disp: , Rfl:  .  lisinopril (PRINIVIL,ZESTRIL) 20 MG tablet, Take by mouth., Disp: , Rfl:  .  LORazepam (ATIVAN) 0.5 MG tablet, Take 1 tablet (0.5 mg total) by mouth daily as needed for anxiety., Disp: 30 tablet, Rfl: 0 .  montelukast (SINGULAIR) 10 MG tablet, Take 1 tablet (10 mg total) by mouth daily., Disp: 90 tablet, Rfl: 1 .  Omega-3 Fatty Acids (FISH OIL) 1000 MG CAPS, Take 1 capsule  by mouth daily., Disp: , Rfl:  .  potassium chloride SA (K-DUR,KLOR-CON) 20 MEQ tablet, Take 20 mEq by mouth daily., Disp: , Rfl:  .  simvastatin (ZOCOR) 40 MG tablet, TAKE 1 TABLET BY MOUTH AT BEDTIME., Disp: 90 tablet, Rfl: 3 .  Tiotropium Bromide Monohydrate (SPIRIVA RESPIMAT) 2.5 MCG/ACT AERS, Inhale 2 puffs into the lungs daily., Disp: , Rfl:   Patient Care Team: Maryella Shivers as PCP - General (Physician Assistant)     Objective:   Vitals: BP 102/60 (BP Location: Left Arm, Patient Position: Sitting, Cuff Size: Normal)   Pulse 72   Temp 98.6 F (37 C) (Oral)   Resp 16   Wt 140 lb (63.5 kg)   BMI 24.80 kg/m   Physical Exam  Constitutional: She is oriented to person, place, and time. She appears well-developed and well-nourished.  Cardiovascular: Normal rate and regular rhythm.   Pulmonary/Chest: Effort normal and breath sounds normal.  Abdominal: Bowel sounds are normal.  Neurological: She is alert and oriented to person, place, and time.  Walking with walker  Skin: Skin is warm and dry.  Psychiatric: She has a normal mood and affect. Her behavior is normal.    Activities of Daily Living In your present state of health, do you have any difficulty performing the following activities: 01/26/2017 10/08/2016  Hearing? N N  Vision? N N  Difficulty concentrating or making decisions? N N  Walking or climbing stairs? Y N  Dressing or bathing? Y N  Doing errands, shopping? N N  Some recent data might be hidden    Fall Risk Assessment Fall Risk  05/25/2016 05/25/2016 03/31/2016  Falls in the past year? (No Data) Yes Yes  Number falls in past yr: - 1 1  Injury with Fall? - No No  Follow up - Falls prevention discussed;Education provided -     Depression Screen PHQ 2/9 Scores 08/25/2016 05/25/2016 03/31/2016  PHQ - 2 Score 1 2 0  PHQ- 9 Score 5 4 -    Cognitive Testing - 6-CIT  Correct? Score   What year is it? yes 0 0 or 4  What month is it? yes 0 0 or 3    Memorize:    Floyde Parkins,  44,  [address],  [place],      What time is it? (within 1 hour) yes 0 0 or 3  Count backwards from 20 yes 0 0, 2, or 4  Name the months of the year yes 0 0, 2, or 4  Repeat name & address above yes 0 0, 2, 4, 6, 8, or 10       TOTAL SCORE  0/28   Interpretation:  Normal  Normal (0-7) Abnormal (8-28)       Assessment & Plan:     Annual Wellness Visit  Reviewed patient's Family Medical History Reviewed and updated list of patient's medical providers Assessment of cognitive impairment was done Assessed patient's functional ability Established a written schedule for health screening services Health Risk Assessent Completed and Reviewed  Exercise Activities and Dietary recommendations Goals    None      Immunization History  Administered Date(s) Administered  . Influenza Split 08/01/2013, 08/16/2014  . Influenza, High Dose Seasonal PF 08/29/2015  . Influenza-Unspecified 08/16/2014  . Pneumococcal Conjugate-13 01/10/2015  . Pneumococcal Polysaccharide-23 08/01/2013    Health Maintenance  Topic Date Due  . TETANUS/TDAP  03/13/1967  . INFLUENZA VACCINE  11/12/2017 (Originally 06/16/2017)  . MAMMOGRAM  06/17/2018  . COLONOSCOPY  04/21/2020  . DEXA SCAN  Completed  . Hepatitis C Screening  Completed  . PNA vac Low Risk Adult  Completed     Discussed health benefits of physical activity, and encouraged her to engage in regular exercise appropriate for her age and condition.   1. Encounter for Medicare annual wellness exam   2. Osteoporosis, unspecified osteoporosis type, unspecified pathological fracture presence  Patient with recent hip fracture and now vertebral compression fracture. On Fosamax inconsistently, however do feel she could benefit from endocrinology evaluation for better management of osteoporosis.   - Ambulatory referral to Endocrinology  3. Breast cancer screening  - MM DIGITAL SCREENING BILATERAL; Future  4. Need  for Tdap vaccination  - Tdap vaccine greater than or equal  to 7yo IM  Return in about 6 months (around 10/02/2017) for mood, HTN, chronic illness .  The entirety of the information documented in the History of Present Illness, Review of Systems and Physical Exam were personally obtained by me. Portions of this information were initially documented by Kavin Leech, CMA and reviewed by me for thoroughness and accuracy.     ------------------------------------------------------------------------------------------------------------    Trey Sailors, PA-C  [facility]

## 2017-04-01 NOTE — Patient Instructions (Signed)
Health Maintenance, Female Adopting a healthy lifestyle and getting preventive care can go a long way to promote health and wellness. Talk with your health care provider about what schedule of regular examinations is right for you. This is a good chance for you to check in with your provider about disease prevention and staying healthy. In between checkups, there are plenty of things you can do on your own. Experts have done a lot of research about which lifestyle changes and preventive measures are most likely to keep you healthy. Ask your health care provider for more information. Weight and diet Eat a healthy diet  Be sure to include plenty of vegetables, fruits, low-fat dairy products, and lean protein.  Do not eat a lot of foods high in solid fats, added sugars, or salt.  Get regular exercise. This is one of the most important things you can do for your health.  Most adults should exercise for at least 150 minutes each week. The exercise should increase your heart rate and make you sweat (moderate-intensity exercise).  Most adults should also do strengthening exercises at least twice a week. This is in addition to the moderate-intensity exercise. Maintain a healthy weight  Body mass index (BMI) is a measurement that can be used to identify possible weight problems. It estimates body fat based on height and weight. Your health care provider can help determine your BMI and help you achieve or maintain a healthy weight.  For females 76 years of age and older:  A BMI below 18.5 is considered underweight.  A BMI of 18.5 to 24.9 is normal.  A BMI of 25 to 29.9 is considered overweight.  A BMI of 30 and above is considered obese. Watch levels of cholesterol and blood lipids  You should start having your blood tested for lipids and cholesterol at 69 years of age, then have this test every 5 years.  You may need to have your cholesterol levels checked more often if:  Your lipid or  cholesterol levels are high.  You are older than 69 years of age.  You are at high risk for heart disease. Cancer screening Lung Cancer  Lung cancer screening is recommended for adults 64-42 years old who are at high risk for lung cancer because of a history of smoking.  A yearly low-dose CT scan of the lungs is recommended for people who:  Currently smoke.  Have quit within the past 15 years.  Have at least a 30-pack-year history of smoking. A pack year is smoking an average of one pack of cigarettes a day for 1 year.  Yearly screening should continue until it has been 15 years since you quit.  Yearly screening should stop if you develop a health problem that would prevent you from having lung cancer treatment. Breast Cancer  Practice breast self-awareness. This means understanding how your breasts normally appear and feel.  It also means doing regular breast self-exams. Let your health care provider know about any changes, no matter how small.  If you are in your 20s or 30s, you should have a clinical breast exam (CBE) by a health care provider every 1-3 years as part of a regular health exam.  If you are 34 or older, have a CBE every year. Also consider having a breast X-ray (mammogram) every year.  If you have a family history of breast cancer, talk to your health care provider about genetic screening.  If you are at high risk for breast cancer, talk  to your health care provider about having an MRI and a mammogram every year.  Breast cancer gene (BRCA) assessment is recommended for women who have family members with BRCA-related cancers. BRCA-related cancers include:  Breast.  Ovarian.  Tubal.  Peritoneal cancers.  Results of the assessment will determine the need for genetic counseling and BRCA1 and BRCA2 testing. Cervical Cancer  Your health care provider may recommend that you be screened regularly for cancer of the pelvic organs (ovaries, uterus, and vagina).  This screening involves a pelvic examination, including checking for microscopic changes to the surface of your cervix (Pap test). You may be encouraged to have this screening done every 3 years, beginning at age 24.  For women ages 66-65, health care providers may recommend pelvic exams and Pap testing every 3 years, or they may recommend the Pap and pelvic exam, combined with testing for human papilloma virus (HPV), every 5 years. Some types of HPV increase your risk of cervical cancer. Testing for HPV may also be done on women of any age with unclear Pap test results.  Other health care providers may not recommend any screening for nonpregnant women who are considered low risk for pelvic cancer and who do not have symptoms. Ask your health care provider if a screening pelvic exam is right for you.  If you have had past treatment for cervical cancer or a condition that could lead to cancer, you need Pap tests and screening for cancer for at least 20 years after your treatment. If Pap tests have been discontinued, your risk factors (such as having a new sexual partner) need to be reassessed to determine if screening should resume. Some women have medical problems that increase the chance of getting cervical cancer. In these cases, your health care provider may recommend more frequent screening and Pap tests. Colorectal Cancer  This type of cancer can be detected and often prevented.  Routine colorectal cancer screening usually begins at 69 years of age and continues through 69 years of age.  Your health care provider may recommend screening at an earlier age if you have risk factors for colon cancer.  Your health care provider may also recommend using home test kits to check for hidden blood in the stool.  A small camera at the end of a tube can be used to examine your colon directly (sigmoidoscopy or colonoscopy). This is done to check for the earliest forms of colorectal cancer.  Routine  screening usually begins at age 41.  Direct examination of the colon should be repeated every 5-10 years through 69 years of age. However, you may need to be screened more often if early forms of precancerous polyps or small growths are found. Skin Cancer  Check your skin from head to toe regularly.  Tell your health care provider about any new moles or changes in moles, especially if there is a change in a mole's shape or color.  Also tell your health care provider if you have a mole that is larger than the size of a pencil eraser.  Always use sunscreen. Apply sunscreen liberally and repeatedly throughout the day.  Protect yourself by wearing long sleeves, pants, a wide-brimmed hat, and sunglasses whenever you are outside. Heart disease, diabetes, and high blood pressure  High blood pressure causes heart disease and increases the risk of stroke. High blood pressure is more likely to develop in:  People who have blood pressure in the high end of the normal range (130-139/85-89 mm Hg).  People who are overweight or obese.  People who are African American.  If you are 18-39 years of age, have your blood pressure checked every 3-5 years. If you are 40 years of age or older, have your blood pressure checked every year. You should have your blood pressure measured twice-once when you are at a hospital or clinic, and once when you are not at a hospital or clinic. Record the average of the two measurements. To check your blood pressure when you are not at a hospital or clinic, you can use:  An automated blood pressure machine at a pharmacy.  A home blood pressure monitor.  If you are between 55 years and 79 years old, ask your health care provider if you should take aspirin to prevent strokes.  Have regular diabetes screenings. This involves taking a blood sample to check your fasting blood sugar level.  If you are at a normal weight and have a low risk for diabetes, have this test once  every three years after 69 years of age.  If you are overweight and have a high risk for diabetes, consider being tested at a younger age or more often. Preventing infection Hepatitis B  If you have a higher risk for hepatitis B, you should be screened for this virus. You are considered at high risk for hepatitis B if:  You were born in a country where hepatitis B is common. Ask your health care provider which countries are considered high risk.  Your parents were born in a high-risk country, and you have not been immunized against hepatitis B (hepatitis B vaccine).  You have HIV or AIDS.  You use needles to inject street drugs.  You live with someone who has hepatitis B.  You have had sex with someone who has hepatitis B.  You get hemodialysis treatment.  You take certain medicines for conditions, including cancer, organ transplantation, and autoimmune conditions. Hepatitis C  Blood testing is recommended for:  Everyone born from 1945 through 1965.  Anyone with known risk factors for hepatitis C. Sexually transmitted infections (STIs)  You should be screened for sexually transmitted infections (STIs) including gonorrhea and chlamydia if:  You are sexually active and are younger than 69 years of age.  You are older than 69 years of age and your health care provider tells you that you are at risk for this type of infection.  Your sexual activity has changed since you were last screened and you are at an increased risk for chlamydia or gonorrhea. Ask your health care provider if you are at risk.  If you do not have HIV, but are at risk, it may be recommended that you take a prescription medicine daily to prevent HIV infection. This is called pre-exposure prophylaxis (PrEP). You are considered at risk if:  You are sexually active and do not regularly use condoms or know the HIV status of your partner(s).  You take drugs by injection.  You are sexually active with a partner  who has HIV. Talk with your health care provider about whether you are at high risk of being infected with HIV. If you choose to begin PrEP, you should first be tested for HIV. You should then be tested every 3 months for as long as you are taking PrEP. Pregnancy  If you are premenopausal and you may become pregnant, ask your health care provider about preconception counseling.  If you may become pregnant, take 400 to 800 micrograms (mcg) of folic acid   every day.  If you want to prevent pregnancy, talk to your health care provider about birth control (contraception). Osteoporosis and menopause  Osteoporosis is a disease in which the bones lose minerals and strength with aging. This can result in serious bone fractures. Your risk for osteoporosis can be identified using a bone density scan.  If you are 4 years of age or older, or if you are at risk for osteoporosis and fractures, ask your health care provider if you should be screened.  Ask your health care provider whether you should take a calcium or vitamin D supplement to lower your risk for osteoporosis.  Menopause may have certain physical symptoms and risks.  Hormone replacement therapy may reduce some of these symptoms and risks. Talk to your health care provider about whether hormone replacement therapy is right for you. Follow these instructions at home:  Schedule regular health, dental, and eye exams.  Stay current with your immunizations.  Do not use any tobacco products including cigarettes, chewing tobacco, or electronic cigarettes.  If you are pregnant, do not drink alcohol.  If you are breastfeeding, limit how much and how often you drink alcohol.  Limit alcohol intake to no more than 1 drink per day for nonpregnant women. One drink equals 12 ounces of beer, 5 ounces of wine, or 1 ounces of hard liquor.  Do not use street drugs.  Do not share needles.  Ask your health care provider for help if you need support  or information about quitting drugs.  Tell your health care provider if you often feel depressed.  Tell your health care provider if you have ever been abused or do not feel safe at home. This information is not intended to replace advice given to you by your health care provider. Make sure you discuss any questions you have with your health care provider. Document Released: 05/18/2011 Document Revised: 04/09/2016 Document Reviewed: 08/06/2015 Elsevier Interactive Patient Education  2017 [employer].

## 2017-04-15 ENCOUNTER — Other Ambulatory Visit: Payer: Self-pay | Admitting: Physician Assistant

## 2017-04-15 DIAGNOSIS — E782 Mixed hyperlipidemia: Secondary | ICD-10-CM

## 2017-04-15 MED ORDER — SIMVASTATIN 40 MG PO TABS: 40.0000 mg | ORAL_TABLET | Freq: Every day | ORAL | 3 refills | Status: DC

## 2017-04-15 NOTE — Telephone Encounter (Signed)
[facility] faxed a refill request on the following medications:  simvastatin (ZOCOR) 40 MG tablet.  Take 1 tablet by mouth at bedtime.  90 day supply.  [facility]

## 2017-05-05 ENCOUNTER — Other Ambulatory Visit: Payer: Self-pay | Admitting: Physician Assistant

## 2017-05-05 DIAGNOSIS — F419 Anxiety disorder, unspecified: Secondary | ICD-10-CM

## 2017-05-05 NOTE — Telephone Encounter (Signed)
[facility] faxed a request on the following medication. Thanks [facility]  LORazepam (ATIVAN) 0.5 MG tablet

## 2017-05-06 MED ORDER — LORAZEPAM 0.5 MG PO TABS: 0.5000 mg | ORAL_TABLET | Freq: Every day | ORAL | 1 refills | Status: DC | PRN

## 2017-05-07 ENCOUNTER — Other Ambulatory Visit: Payer: Self-pay | Admitting: Physician Assistant

## 2017-05-07 ENCOUNTER — Encounter: Payer: Self-pay | Admitting: Physician Assistant

## 2017-05-07 ENCOUNTER — Ambulatory Visit
Admission: RE | Admit: 2017-05-07 | Discharge: 2017-05-07 | Disposition: A | Discharge status: Home / Self Care | Payer: Medicare Other | Source: Ambulatory Visit | Attending: Physician Assistant | Admitting: Physician Assistant

## 2017-05-07 ENCOUNTER — Ambulatory Visit (INDEPENDENT_AMBULATORY_CARE_PROVIDER_SITE_OTHER): Payer: Medicare Other | Admitting: Physician Assistant

## 2017-05-07 VITALS — BP 100/62 | HR 96 | Temp 97.9°F | Resp 20 | Wt 144.0 lb

## 2017-05-07 DIAGNOSIS — R1030 Lower abdominal pain, unspecified: Secondary | ICD-10-CM | POA: Diagnosis present

## 2017-05-07 DIAGNOSIS — R1032 Left lower quadrant pain: Secondary | ICD-10-CM

## 2017-05-07 DIAGNOSIS — M810 Age-related osteoporosis without current pathological fracture: Secondary | ICD-10-CM

## 2017-05-07 DIAGNOSIS — Z8781 Personal history of (healed) traumatic fracture: Secondary | ICD-10-CM

## 2017-05-07 DIAGNOSIS — M25552 Pain in left hip: Secondary | ICD-10-CM

## 2017-05-07 DIAGNOSIS — S32592D Other specified fracture of left pubis, subsequent encounter for fracture with routine healing: Secondary | ICD-10-CM | POA: Diagnosis not present

## 2017-05-07 DIAGNOSIS — R52 Pain, unspecified: Secondary | ICD-10-CM

## 2017-05-07 MED ORDER — OXYCODONE-ACETAMINOPHEN 5-325 MG PO TABS: ORAL_TABLET | ORAL | 0 refills | Status: DC

## 2017-05-07 NOTE — Addendum Note (Signed)
Addended by: Trey Sailors on: 05/07/2017 01:15 PM   Modules accepted: Level of Service

## 2017-05-07 NOTE — Progress Notes (Addendum)
Patient: Lori Brady Female    DOB: May 07, 1946   71 y.o.   MRN: [id number] Visit Date: 05/07/2017  Today's Provider: Trey Sailors, PA-C   Chief Complaint  Patient presents with  . Leg Pain   Subjective:      Lori Brady is a 71 y/o woman with history of osteoporosis, and right acetabular and pubic rami fracture after a fall in March presenting today for left leg pain. It has been going on since yesterday. No falls or muscle strain. No redness of skin or signs of infection. No lumps in groin area. No urinary symptoms or abdominal pain.No loss of sensation but harder to walk. No confusion or incontinence. No balance issues. 9/10 sharp stabbing pain.   Leg Pain   Incident onset: x 2 days. There was no injury mechanism. Pain location: left groin radiating into left thigh. The quality of the pain is described as aching and stabbing. The pain is at a severity of 9/10. The pain is severe. The pain has been constant since onset. Associated symptoms include an inability to bear weight (is ambulating with walker), a loss of motion and muscle weakness. Pertinent negatives include no loss of sensation, numbness or tingling. The symptoms are aggravated by weight bearing. Treatments tried: Ibuprofen, Aspirin, gabapentin. The treatment provided no relief.       Allergies  Allergen Reactions  . Levaquin [Levofloxacin] Nausea Only  . Vicodin [Hydrocodone-Acetaminophen] Nausea Only     Current Outpatient Prescriptions:  .  albuterol (ACCUNEB) 0.63 MG/3ML nebulizer solution, Take 1 ampule by nebulization every 6 (six) hours as needed for wheezing., Disp: , Rfl:  .  albuterol (PROVENTIL HFA;VENTOLIN HFA) 108 (90 Base) MCG/ACT inhaler, Inhale 2 puffs into the lungs every 4 (four) hours as needed for wheezing or shortness of breath., Disp: , Rfl:  .  alendronate (FOSAMAX) 70 MG tablet, Take 1 tablet (70 mg total) by mouth once a week., Disp: 12 tablet, Rfl: 4 .  aspirin 81 MG tablet, Take 81  mg by mouth daily., Disp: , Rfl:  .  betamethasone valerate (VALISONE) 0.1 % cream, APPLY TOPICALLY TO AFFECTED AREA SPARINGLY 2 TIMES DAILY., Disp: 15 g, Rfl: 5 .  chlorthalidone (HYGROTON) 25 MG tablet, Take 1 tablet (25 mg total) by mouth daily., Disp: 30 tablet, Rfl: 0 .  Cholecalciferol (VITAMIN D) 2000 UNITS CAPS, Take 1 capsule by mouth daily., Disp: , Rfl:  .  citalopram (CELEXA) 20 MG tablet, Take 1 tablet (20 mg total) by mouth daily. (Patient taking differently: Take 20 mg by mouth at bedtime. ), Disp: 90 tablet, Rfl: 1 .  doxepin (SINEQUAN) 25 MG capsule, Take 25 mg by mouth at bedtime. , Disp: , Rfl:  .  gabapentin (NEURONTIN) 100 MG capsule, Take 100 mg by mouth 3 (three) times daily., Disp: , Rfl:  .  guaiFENesin (MUCINEX) 600 MG 12 hr tablet, Take 1 tablet (600 mg total) by mouth 2 (two) times daily., Disp: , Rfl:  .  ibuprofen (ADVIL,MOTRIN) 200 MG tablet, Take 800 mg by mouth every 6 (six) hours as needed., Disp: , Rfl:  .  lisinopril (PRINIVIL,ZESTRIL) 20 MG tablet, Take by mouth., Disp: , Rfl:  .  montelukast (SINGULAIR) 10 MG tablet, Take 1 tablet (10 mg total) by mouth daily., Disp: 90 tablet, Rfl: 1 .  Omega-3 Fatty Acids (FISH OIL) 1000 MG CAPS, Take 1 capsule by mouth daily., Disp: , Rfl:  .  potassium chloride SA (K-DUR,KLOR-CON) 20  MEQ tablet, Take 20 mEq by mouth daily., Disp: , Rfl:  .  simvastatin (ZOCOR) 40 MG tablet, Take 1 tablet (40 mg total) by mouth at bedtime., Disp: 90 tablet, Rfl: 3 .  Tiotropium Bromide Monohydrate (SPIRIVA RESPIMAT) 2.5 MCG/ACT AERS, Inhale 2 puffs into the lungs daily., Disp: , Rfl:  .  hydrOXYzine (ATARAX/VISTARIL) 10 MG tablet, Take 10-20 mg by mouth at bedtime., Disp: , Rfl:  .  LORazepam (ATIVAN) 0.5 MG tablet, Take 1 tablet (0.5 mg total) by mouth daily as needed for anxiety. (Patient not taking: Reported on 05/07/2017), Disp: 30 tablet, Rfl: 1 .  oxyCODONE-acetaminophen (ROXICET) 5-325 MG tablet, One to one-half tablet twice daily as  needed., Disp: 20 tablet, Rfl: 0 .  Tdap (BOOSTRIX) 5-2.5-18.5 LF-MCG/0.5 injection, Boostrix Tdap 2.5 Lf unit-8 mcg-5 Lf/0.5 mL intramuscular syringe, Disp: , Rfl:   Review of Systems  Cardiovascular: Negative for leg swelling.  Musculoskeletal: Positive for gait problem and myalgias.  Neurological: Negative for tingling and numbness.    Social History  Substance Use Topics  . Smoking status: Former Smoker    Packs/day: 1.00    Years: 30.00    Types: Cigarettes    Quit date: 08/01/2008  . Smokeless tobacco: Never Used  . Alcohol use Yes     Comment: occasional   Objective:   BP 100/62 (BP Location: Left Arm, Patient Position: Sitting, Cuff Size: Normal)   Pulse 96   Temp 97.9 F (36.6 C) (Oral)   Resp 20   Wt 144 lb (65.3 kg)   BMI 25.51 kg/m  Vitals:   05/07/17 0906  BP: 100/62  Pulse: 96  Resp: 20  Temp: 97.9 F (36.6 C)  TempSrc: Oral  Weight: 144 lb (65.3 kg)     Physical Exam  Constitutional: She is oriented to person, place, and time.  Cardiovascular:  Pulses:      Dorsalis pedis pulses are 2+ on the right side, and 2+ on the left side.       Posterior tibial pulses are 2+ on the right side, and 2+ on the left side.  Musculoskeletal: She exhibits no edema, tenderness or deformity.  Strength 5/5 bilateral lower extremities  Lymphadenopathy:       Right: No inguinal adenopathy present.       Left: No inguinal adenopathy present.  Neurological: She is alert and oriented to person, place, and time. She has normal reflexes. Coordination normal.  Using walker today after being done from using it after hip fracture  Skin: Skin is warm and dry. No rash noted. No erythema.  Psychiatric: She has a normal mood and affect. Her behavior is normal.        Assessment & Plan:     1. History of fracture of right hip  2. Left groin pain  Neurovascularly intact, full strength, no signs of infection or injury. Hip, Pelvis, and femur xrays revealed old and healing  fractures but no new. I have independently reviewed the images and agree with the findings. Could be muscle spasms. Gave patient pain medicine for unbearable pain, counseled on sedation she did well with it before. Has granddaughter's wedding to go to.  - oxyCODONE-acetaminophen (ROXICET) 5-325 MG tablet; One to one-half tablet twice daily as needed.  Dispense: 20 tablet; Refill: 0 - DG FEMUR MIN 2 VIEWS LEFT; Future  3. Left hip pain  See above.  - oxyCODONE-acetaminophen (ROXICET) 5-325 MG tablet; One to one-half tablet twice daily as needed.  Dispense: 20 tablet; Refill:  0 - DG FEMUR MIN 2 VIEWS LEFT; Future  4. Osteoporosis   Currently taking Fosamax daily after having stopped for undetermined period of time. Worsening DEXA score on last scan, she has appointment with endocrinology in July.  Return if symptoms worsen or fail to improve.  The entirety of the information documented in the History of Present Illness, Review of Systems and Physical Exam were personally obtained by me. Portions of this information were initially documented by Irving Burton D and reviewed by me for thoroughness and accuracy.           Trey Sailors, PA-C  [facility]

## 2017-05-07 NOTE — Patient Instructions (Signed)
Hip Pain The hip is the joint between the upper legs and the lower pelvis. The bones, cartilage, tendons, and muscles of your hip joint support your body and allow you to move around. Hip pain can range from a minor ache to severe pain in one or both of your hips. The pain may be felt on the inside of the hip joint near the groin, or the outside near the buttocks and upper thigh. You may also have swelling or stiffness. Follow these instructions at home: Managing pain, stiffness, and swelling   If directed, apply ice to the injured area.  Put ice in a plastic bag.  Place a towel between your skin and the bag.  Leave the ice on for 20 minutes, 2-3 times a day  Sleep with a pillow between your legs on your most comfortable side.  Avoid any activities that cause pain. General instructions   Take over-the-counter and prescription medicines only as told by your health care provider.  Do any exercises as told by your health care provider.  Record the following:  How often you have hip pain.  The location of your pain.  What the pain feels like.  What makes the pain worse.  Keep all follow-up visits as told by your health care provider. This is important. Contact a health care provider if:  You cannot put weight on your leg.  Your pain or swelling continues or gets worse after one week.  It gets harder to walk.  You have a fever. Get help right away if:  You fall.  You have a sudden increase in pain and swelling in your hip.  Your hip is red or swollen or very tender to touch. Summary  Hip pain can range from a minor ache to severe pain in one or both of your hips.  The pain may be felt on the inside of the hip joint near the groin, or the outside near the buttocks and upper thigh.  Avoid any activities that cause pain.  Record how often you have hip pain, the location of the pain, what makes it worse and what it feels like. This information is not intended to  replace advice given to you by your health care provider. Make sure you discuss any questions you have with your health care provider. Document Released: 04/22/2010 Document Revised: 10/05/2016 Document Reviewed: 10/05/2016 Elsevier Interactive Patient Education  2017 [employer] Inc.  

## 2017-06-02 ENCOUNTER — Other Ambulatory Visit: Payer: Self-pay | Admitting: Physician Assistant

## 2017-06-29 ENCOUNTER — Other Ambulatory Visit: Payer: Self-pay | Admitting: Physician Assistant

## 2017-06-29 NOTE — Telephone Encounter (Signed)
[facility] faxed refill request for the following medications: montelukast (SINGULAIR) 10 MG tablet  90 day supply  Last Rx: 12/11/16 LOV: 05/07/17 Please advise. Thanks TNP

## 2017-06-30 ENCOUNTER — Other Ambulatory Visit: Payer: Self-pay

## 2017-06-30 MED ORDER — MONTELUKAST SODIUM 10 MG PO TABS: 10.0000 mg | ORAL_TABLET | Freq: Every day | ORAL | 1 refills | Status: DC

## 2017-06-30 MED ORDER — POTASSIUM CHLORIDE CRYS ER 20 MEQ PO TBCR: 20.0000 meq | EXTENDED_RELEASE_TABLET | Freq: Every day | ORAL | 5 refills | Status: DC

## 2017-06-30 NOTE — Telephone Encounter (Signed)
Refill Request from [facility]  Medication: Potassium  CL Er 20MEQ TER  Qty:60

## 2017-07-07 ENCOUNTER — Other Ambulatory Visit: Payer: Self-pay

## 2017-07-08 MED ORDER — FLUTICASONE PROPIONATE 50 MCG/ACT NA SUSP: 2.0000 | Freq: Every day | NASAL | 6 refills | Status: DC

## 2017-08-25 ENCOUNTER — Other Ambulatory Visit: Payer: Self-pay | Admitting: Physician Assistant

## 2017-08-25 DIAGNOSIS — F419 Anxiety disorder, unspecified: Secondary | ICD-10-CM

## 2017-08-25 MED ORDER — CITALOPRAM HYDROBROMIDE 20 MG PO TABS: 20.0000 mg | ORAL_TABLET | Freq: Every day | ORAL | 1 refills | Status: DC

## 2017-08-25 NOTE — Telephone Encounter (Signed)
[facility] faxed refill request for the following medications:  citalopram (CELEXA) 20 MG tablet  90 day supply   Last Rx: 11/17/16 LOV: 05/07/17 Please advise. Thanks TNP

## 2017-08-31 IMAGING — DX DG CHEST 1V PORT
1 series · 1 of 1 positions shown · non-contrast
Comparison: Earlier today

CLINICAL DATA: Central line placement

EXAM:
PORTABLE CHEST 1 VIEW

[chest ap]
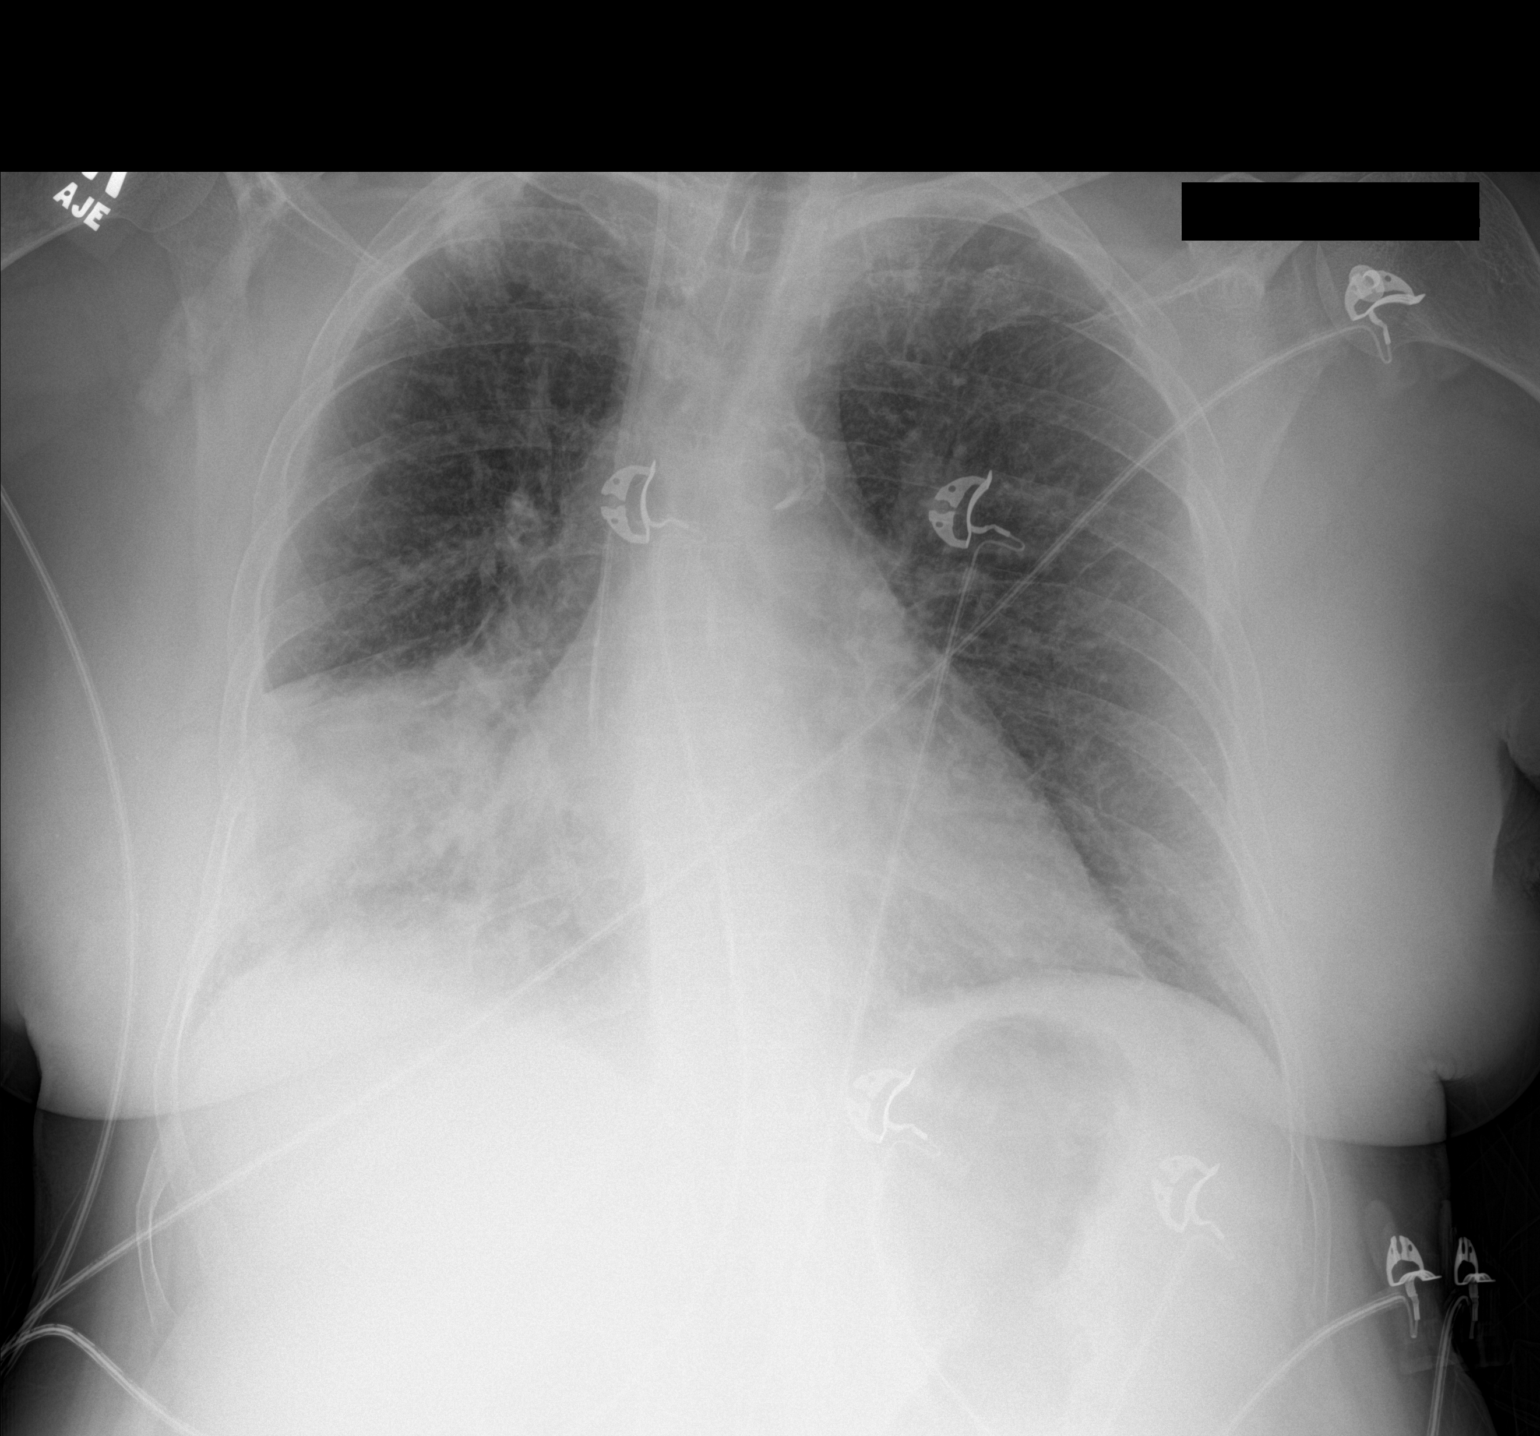

[1 of 1 positions shown; findings below may reference images not displayed]

FINDINGS: New right IJ central line with tip at the upper right atrium. No
pneumothorax or mediastinal widening.

Dense right basilar pneumonia. No effusion or cavitation visualized.
Background of generalized interstitial coarsening.
IMPRESSION: 1. No acute finding after central line placement.
2. Dense right basilar pneumonia.

## 2017-09-02 ENCOUNTER — Other Ambulatory Visit: Payer: Self-pay | Admitting: Pulmonary Disease

## 2017-09-11 IMAGING — CR DG CHEST 2V
1 series · 2 of 2 positions shown · non-contrast
Comparison: 10/08/2016

CLINICAL DATA: Persistent productive cough.

EXAM:
CHEST  2 VIEW

[Series 1: dg chest 2 view · 0.14mm/px · 2 of 2 slices shown]
[im 1/2]
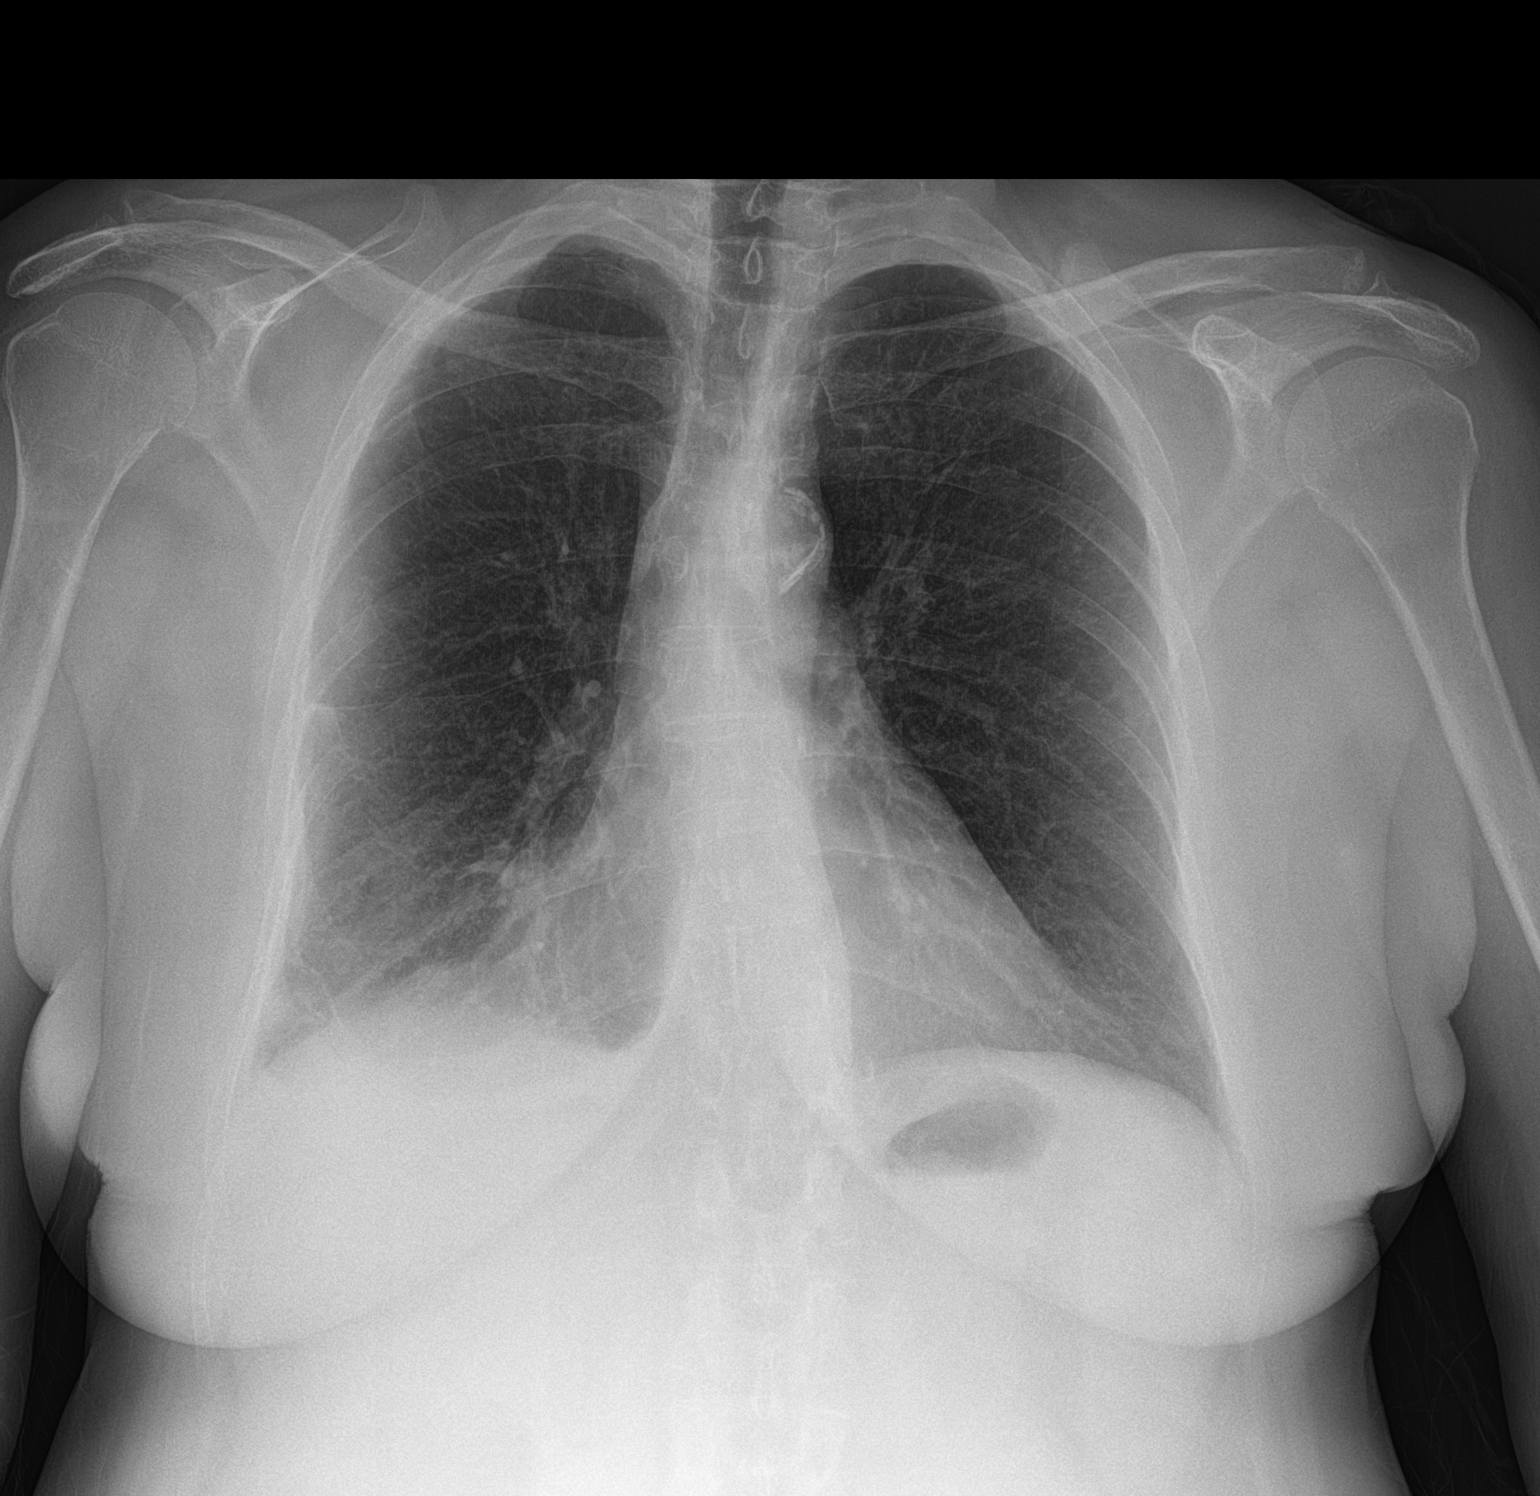
[im 2/2]
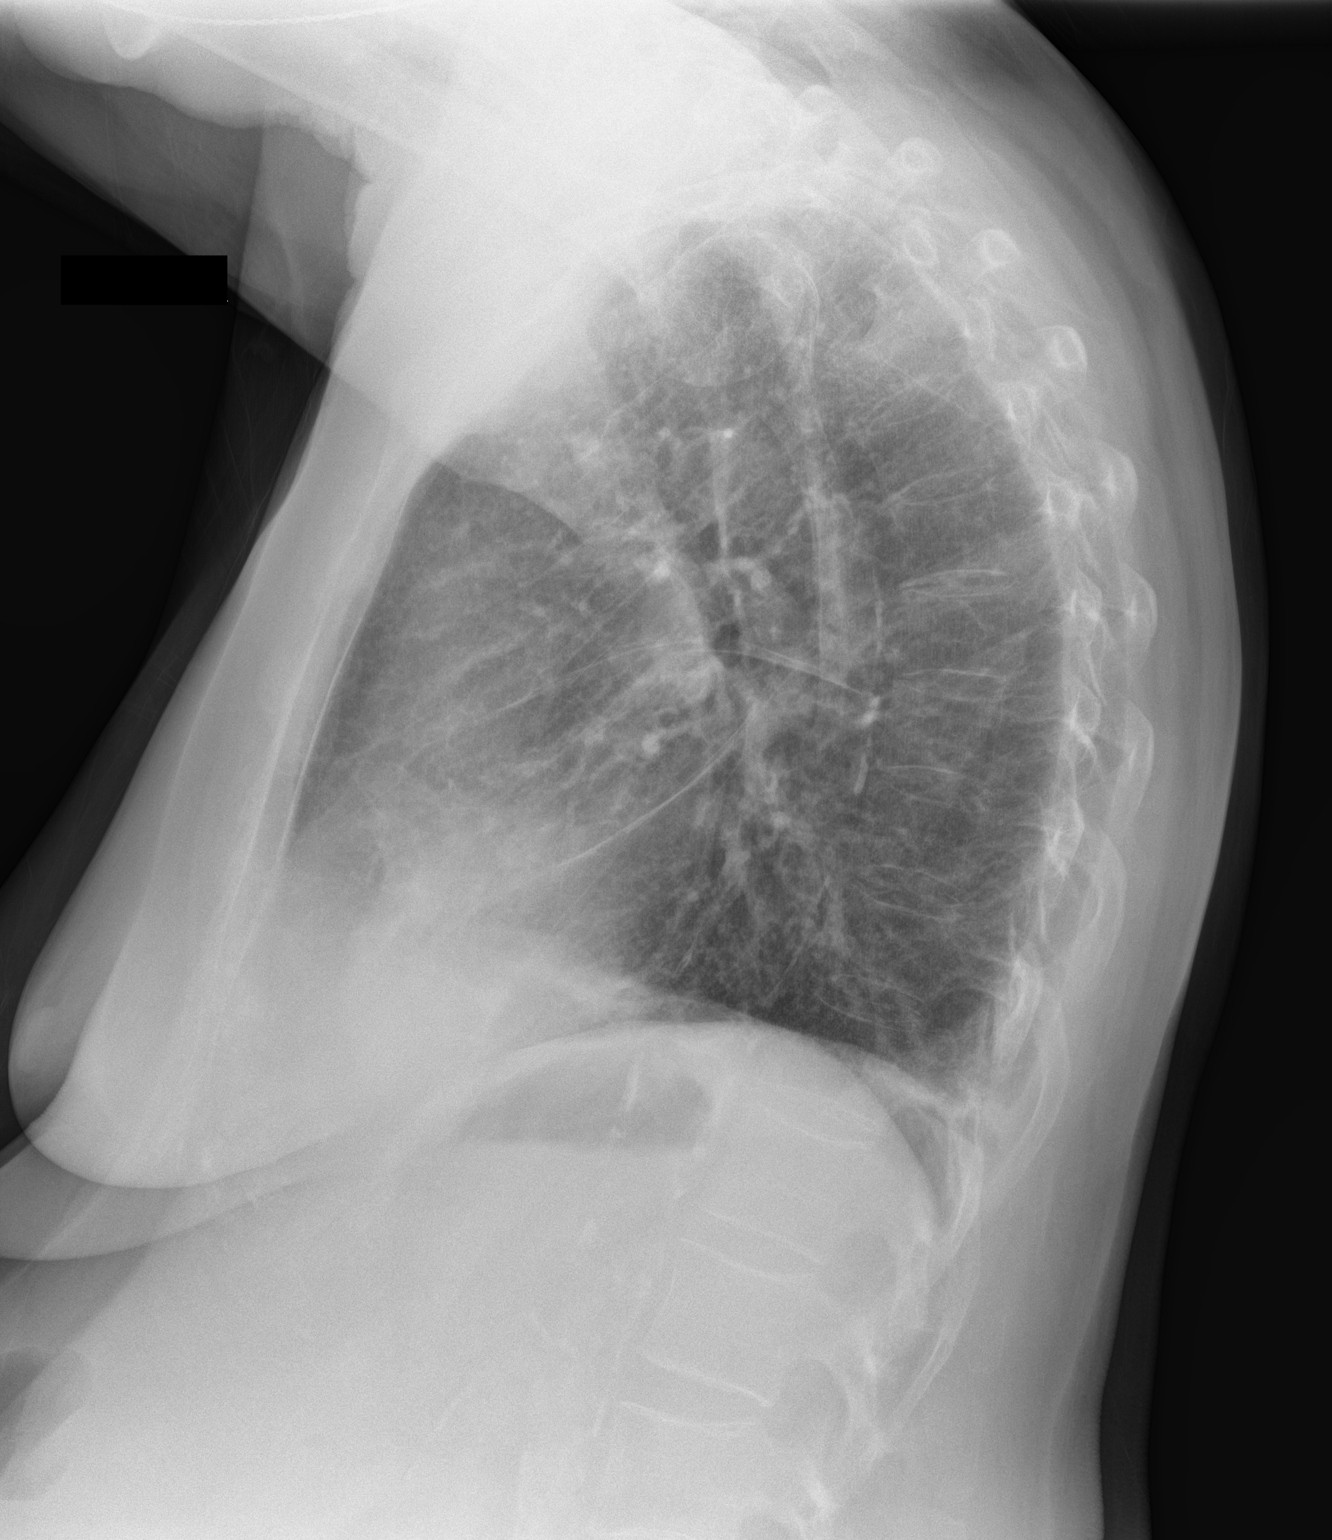

[2 of 2 positions shown; findings below may reference images not displayed]

FINDINGS: Previously seen central line has been removed. Left chest remains
clear. There is mild persistent infiltrate in the right middle lobe,
considerably improved since the previous study. [REDACTED] be a tiny
amount of pleural fluid in the posterior costophrenic angle. No
acute bone finding. Heart size is normal. Aortic atherosclerosis
again noted.
IMPRESSION: Marked radiographic improvement, but with some residual patchy
density in the right middle lobe. Continue to follow till clearing.

## 2017-09-24 ENCOUNTER — Other Ambulatory Visit: Payer: Self-pay | Admitting: Pulmonary Disease

## 2017-09-25 IMAGING — CR DG CHEST 2V
1 series · 2 of 2 positions shown · non-contrast
Comparison: PA and lateral chest x-ray October 19, 2016

CLINICAL DATA: Follow-up of pneumonia last month. Persistent cough
and chest congestion. History of asthma-COPD, former smoker,
previous MI and CVA.

EXAM:
CHEST  2 VIEW

[Series 1: dg chest 2 view · 0.14mm/px · 2 of 2 slices shown]
[im 1/2]
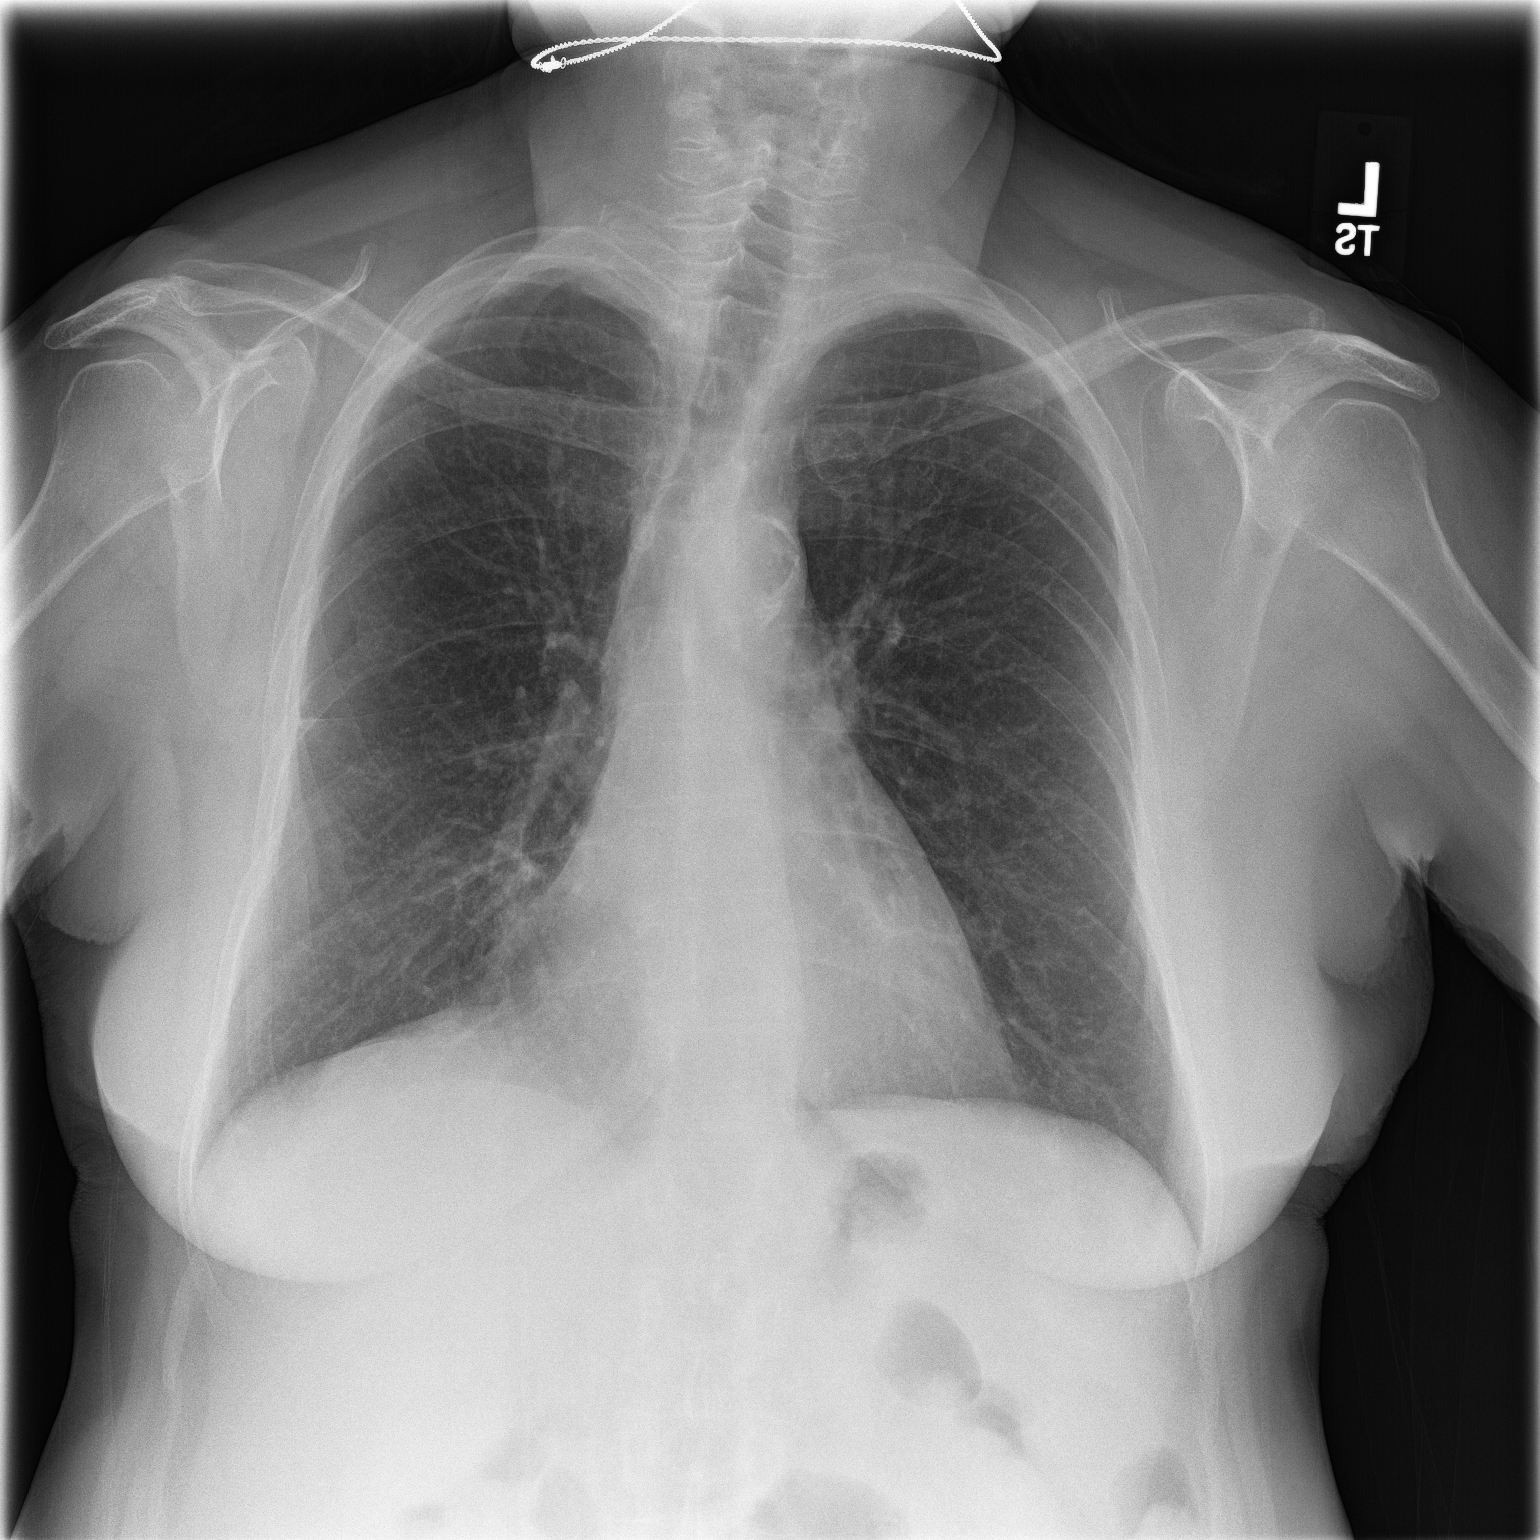
[im 2/2]
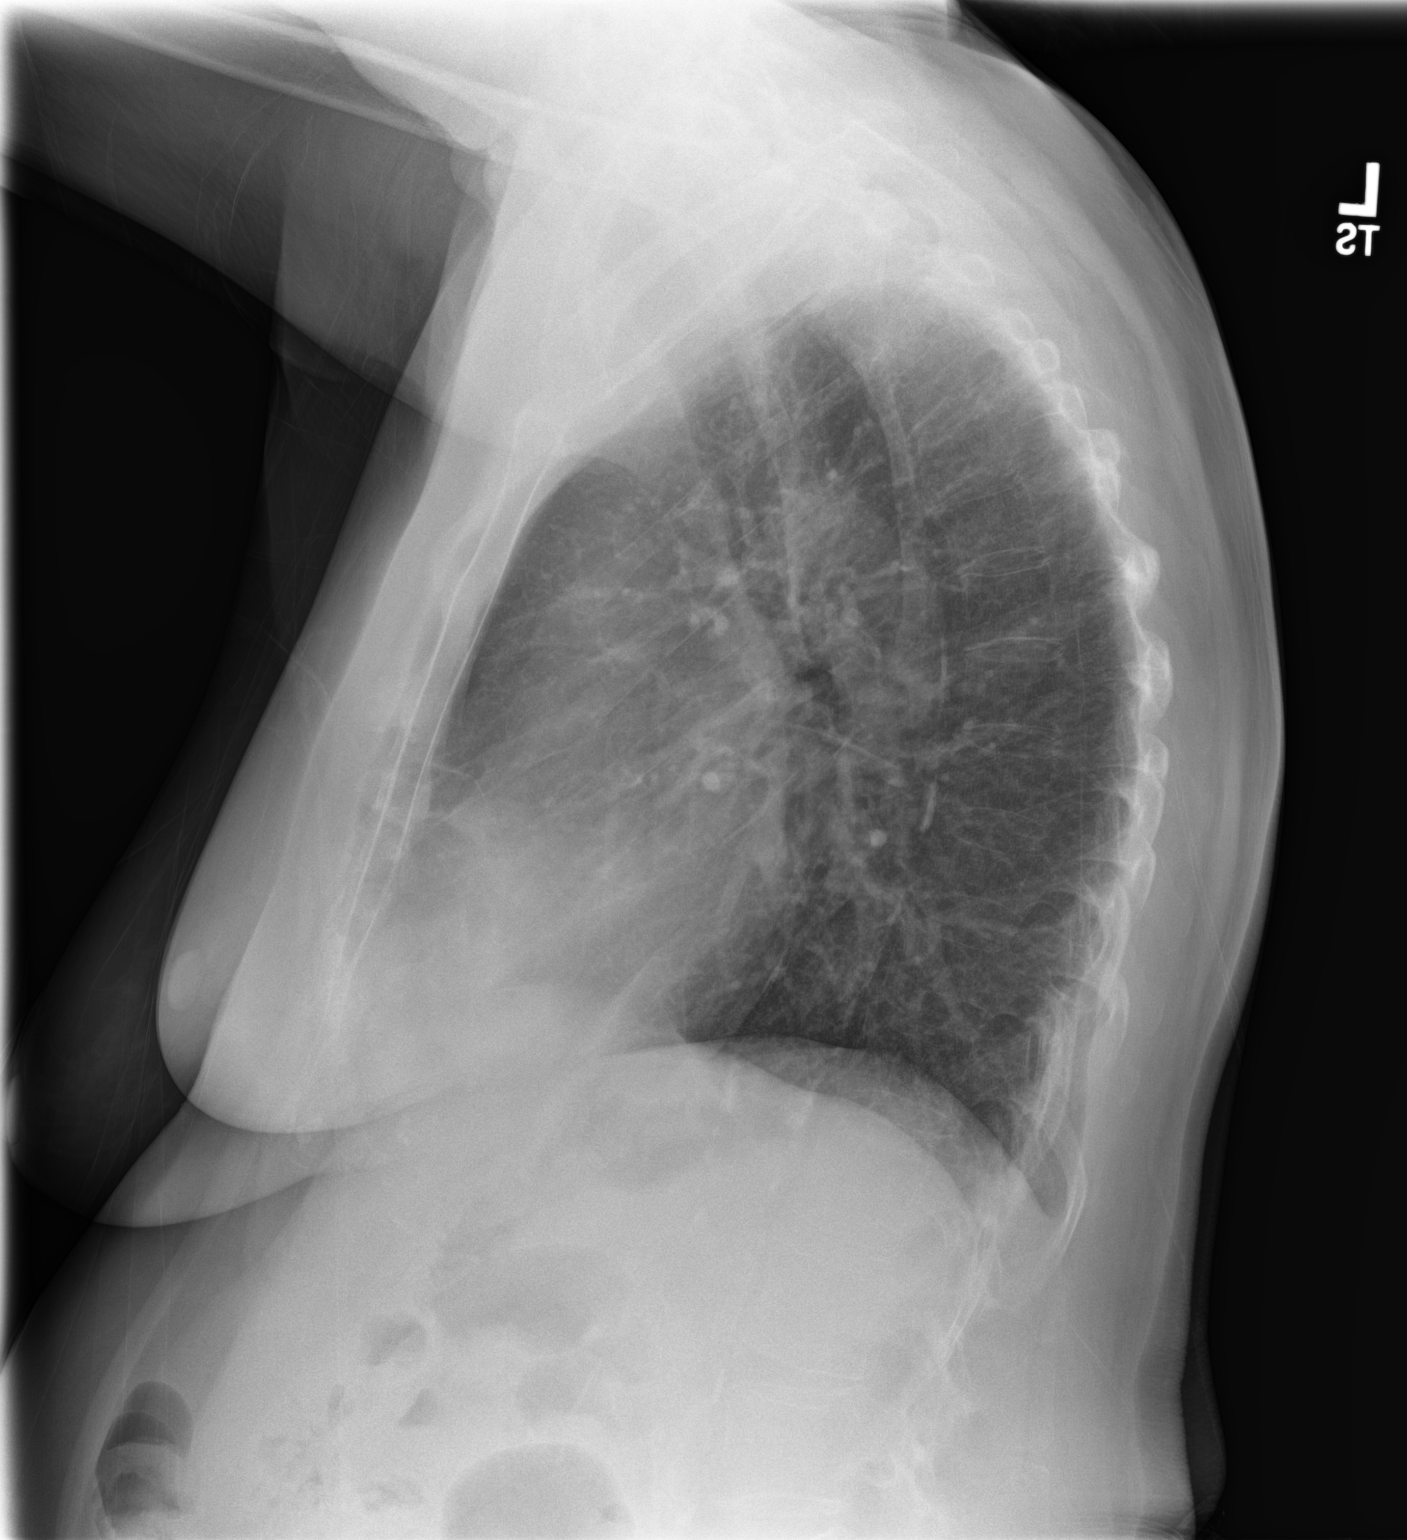

[2 of 2 positions shown; findings below may reference images not displayed]

FINDINGS: The lungs are well-expanded. There is no focal infiltrate. The
interstitial markings are coarse. The heart and pulmonary
vascularity are normal. There is calcification within the wall of
the thoracic aorta. The bony thorax exhibits no acute abnormality.
There is chronic mild dextrocurvature centered in the mid thoracic
spine.
IMPRESSION: Chronic bronchitic changes. No evidence of pneumonia nor other acute
cardiopulmonary abnormality.

Thoracic aortic atherosclerosis.

## 2017-10-01 ENCOUNTER — Encounter: Payer: Self-pay | Admitting: Physician Assistant

## 2017-10-01 ENCOUNTER — Ambulatory Visit: Payer: Medicare Other | Admitting: Physician Assistant

## 2017-10-01 VITALS — BP 126/64 | HR 72 | Temp 98.3°F | Resp 16 | Wt 143.0 lb

## 2017-10-01 DIAGNOSIS — F419 Anxiety disorder, unspecified: Secondary | ICD-10-CM

## 2017-10-01 DIAGNOSIS — E222 Syndrome of inappropriate secretion of antidiuretic hormone: Secondary | ICD-10-CM | POA: Diagnosis not present

## 2017-10-01 DIAGNOSIS — I10 Essential (primary) hypertension: Secondary | ICD-10-CM

## 2017-10-01 DIAGNOSIS — M4800 Spinal stenosis, site unspecified: Secondary | ICD-10-CM | POA: Diagnosis not present

## 2017-10-01 DIAGNOSIS — K760 Fatty (change of) liver, not elsewhere classified: Secondary | ICD-10-CM

## 2017-10-01 DIAGNOSIS — M810 Age-related osteoporosis without current pathological fracture: Secondary | ICD-10-CM

## 2017-10-01 NOTE — Patient Instructions (Signed)

## 2017-10-01 NOTE — Progress Notes (Signed)
Patient: Lori Brady Female    DOB: October 02, 1946   71 y.o.   MRN: [id number] Visit Date: 10/01/2017  Today's Provider: Trinna Post, PA-C   Chief Complaint  Patient presents with  . Hypertension  . Anxiety  . Hyperlipidemia   Subjective:    Lori Brady is a 71 y/o woman with multiple chronic illnesses here today for follow up of Anxiety, HTN, and HLD.   Regarding her anxiety, she reports doing well. She has discontinued her Lorazepam, Atarax, and Doxepin at instruction of her dermatologist. She is doing well on her Celexa.  Regarding her HTN, it is well controlled. She is taking Lisinopril and chlorthalidone. Last CMET available 05/2017 in [place], drawn by endocrinology. Revealed good kidney function. Denies chest pain, SOB, edema. She is tolerating her Zocor well for her HLD.  She has been visiting endocrinology for her osteoporosis. She is being treated with Tymlos x 2 years with plans to transition to oral bisphosphonate after. Also, it was determined she had SIADH due to her persistent hyponatremia and urine testing. She has been instructed on limiting her fluid intake to 1.5L maximum daily.   She has also recently been seen by physiatrist Dr. Sharlet Salina who is administering epidural steroid injections for her spinal stenosis. She is experiencing much improvement in her mobility with this treatment. She reports if the injections stop being effective, she may have to pursue surgery.  She also has a history of liver enzyme elevations. She underwent an ultrasound and liver biopsy in 04/2016 with GI which revealed fatty liver with significant fibrosis. Her last liver enzymes in 05/2017 were within normal range.    Anxiety  Presents for follow-up visit. Patient reports no chest pain, compulsions, confusion, depressed mood, dizziness, dry mouth, excessive worry, feeling of choking, hyperventilation, impotence, insomnia, irritability, malaise, muscle tension, nausea,  nervous/anxious behavior, obsessions, palpitations, panic, restlessness, shortness of breath or suicidal ideas. The quality of sleep is good (When she takes Advil Pm). Nighttime awakenings: occasional.    Hypertension  This is a chronic problem. The problem is unchanged. The problem is controlled. Associated symptoms include anxiety. Pertinent negatives include no chest pain, headaches, neck pain, palpitations or shortness of breath. There are no associated agents to hypertension. There are no compliance problems.   Hyperlipidemia  This is a chronic problem. The problem is controlled. Pertinent negatives include no chest pain, focal sensory loss, focal weakness, leg pain, myalgias or shortness of breath.       Allergies  Allergen Reactions  . Levaquin [Levofloxacin] Nausea Only  . Vicodin [Hydrocodone-Acetaminophen] Nausea Only     Current Outpatient Medications:  .  Abaloparatide (TYMLOS) 3120 MCG/1.56ML SOPN, Inject into the skin., Disp: , Rfl:  .  albuterol (ACCUNEB) 0.63 MG/3ML nebulizer solution, Take 1 ampule by nebulization every 6 (six) hours as needed for wheezing., Disp: , Rfl:  .  albuterol (PROVENTIL HFA;VENTOLIN HFA) 108 (90 Base) MCG/ACT inhaler, Inhale 2 puffs into the lungs every 4 (four) hours as needed for wheezing or shortness of breath., Disp: , Rfl:  .  aspirin 81 MG tablet, Take 81 mg by mouth daily., Disp: , Rfl:  .  betamethasone valerate (VALISONE) 0.1 % cream, APPLY TOPICALLY TO AFFECTED AREA SPARINGLY 2 TIMES DAILY., Disp: 15 g, Rfl: 5 .  chlorthalidone (HYGROTON) 25 MG tablet, Take 1 tablet (25 mg total) by mouth daily., Disp: 30 tablet, Rfl: 0 .  Cholecalciferol (VITAMIN D) 2000 UNITS CAPS, Take  1 capsule by mouth daily., Disp: , Rfl:  .  citalopram (CELEXA) 20 MG tablet, Take 1 tablet (20 mg total) by mouth daily., Disp: 90 tablet, Rfl: 1 .  fluticasone (FLONASE) 50 MCG/ACT nasal spray, Place 2 sprays into both nostrils daily., Disp: 16 g, Rfl: 6 .   gabapentin (NEURONTIN) 100 MG capsule, Take 100 mg by mouth 3 (three) times daily., Disp: , Rfl:  .  guaiFENesin (MUCINEX) 600 MG 12 hr tablet, Take 1 tablet (600 mg total) by mouth 2 (two) times daily., Disp: , Rfl:  .  ibuprofen (ADVIL,MOTRIN) 200 MG tablet, Take 800 mg by mouth every 6 (six) hours as needed., Disp: , Rfl:  .  lisinopril (PRINIVIL,ZESTRIL) 20 MG tablet, Take by mouth., Disp: , Rfl:  .  montelukast (SINGULAIR) 10 MG tablet, Take 1 tablet (10 mg total) by mouth daily., Disp: 90 tablet, Rfl: 1 .  Omega-3 Fatty Acids (FISH OIL) 1000 MG CAPS, Take 1 capsule by mouth daily., Disp: , Rfl:  .  potassium chloride SA (K-DUR,KLOR-CON) 20 MEQ tablet, Take 1 tablet (20 mEq total) by mouth daily., Disp: 60 tablet, Rfl: 5 .  simvastatin (ZOCOR) 40 MG tablet, Take 1 tablet (40 mg total) by mouth at bedtime., Disp: 90 tablet, Rfl: 3 .  Tdap (BOOSTRIX) 5-2.5-18.5 LF-MCG/0.5 injection, Boostrix Tdap 2.5 Lf unit-8 mcg-5 Lf/0.5 mL intramuscular syringe, Disp: , Rfl:  .  Tiotropium Bromide Monohydrate (SPIRIVA RESPIMAT) 2.5 MCG/ACT AERS, Inhale 2 puffs into the lungs daily., Disp: , Rfl:  .  FLUAD 0.5 ML SUSY, ADM 0.5ML IM UTD, Disp: , Rfl: 0  Review of Systems  Constitutional: Negative.  Negative for irritability.  Respiratory: Negative.  Negative for shortness of breath.   Cardiovascular: Negative.  Negative for chest pain and palpitations.  Gastrointestinal: Negative.  Negative for nausea.  Genitourinary: Negative for impotence.  Musculoskeletal: Positive for arthralgias. Negative for back pain, gait problem, joint swelling, myalgias, neck pain and neck stiffness.  Neurological: Negative for dizziness, focal weakness, light-headedness and headaches.  Psychiatric/Behavioral: Negative.  Negative for confusion and suicidal ideas. The patient is not nervous/anxious and does not have insomnia.     Social History   Tobacco Use  . Smoking status: Former Smoker    Packs/day: 1.00    Years: 30.00      Pack years: 30.00    Types: Cigarettes    Last attempt to quit: 08/01/2008    Years since quitting: 9.1  . Smokeless tobacco: Never Used  Substance Use Topics  . Alcohol use: Yes    Comment: occasional   Objective:   BP 126/64 (BP Location: Left Arm, Patient Position: Sitting, Cuff Size: Normal)   Pulse 72   Temp 98.3 F (36.8 C) (Oral)   Resp 16   Wt 143 lb (64.9 kg)   BMI 25.33 kg/m  Vitals:   10/01/17 1034  BP: 126/64  Pulse: 72  Resp: 16  Temp: 98.3 F (36.8 C)  TempSrc: Oral  Weight: 143 lb (64.9 kg)     Physical Exam  Constitutional: She is oriented to person, place, and time. She appears well-developed and well-nourished.  Cardiovascular: Normal rate and regular rhythm.  Pulmonary/Chest: Effort normal and breath sounds normal.  Abdominal: Soft. Bowel sounds are normal.  Neurological: She is alert and oriented to person, place, and time.  Skin: Skin is warm and dry.  Psychiatric: Her behavior is normal.        Assessment & Plan:     1. Benign essential  HTN  Stable. Continue chlorthalidone and Lisinopril. Labs drawn 05/2017 display some hyponatremia and low chloride. Patient is on fluid restriction. Will draw labs at next visit.   2. Fatty liver determined by biopsy  Patient declines repeat ultrasound right now. Encourage her to get this periodically to monitor any potential progression of fibrosis.  3. Anxiety  Controlled on Celexa.  4. SIADH (syndrome of inappropriate ADH production) ([place])  Evaluated by Dr. Gabriel Carina in endocrinology, fluid restriction.   5. Spinal stenosis, unspecified spinal region  Getting epidural steroid injections with Dr. Sharlet Salina.   6. Osteoporosis  Tymlos x 2 years followed by bisphosphonates, seeing endocrinology.   Return in about 3 months (around 01/01/2018) for AWV.  The entirety of the information documented in the History of Present Illness, Review of Systems and Physical Exam were personally obtained by me.  Portions of this information were initially documented by Ashley Royalty, CMA and reviewed by me for thoroughness and accuracy.        Trinna Post, PA-C  [facility]

## 2017-10-18 ENCOUNTER — Other Ambulatory Visit (HOSPITAL_COMMUNITY): Payer: Self-pay | Admitting: Neurology

## 2017-10-18 ENCOUNTER — Other Ambulatory Visit: Payer: Self-pay | Admitting: Neurology

## 2017-10-18 DIAGNOSIS — G20 Parkinson's disease: Secondary | ICD-10-CM

## 2017-10-25 ENCOUNTER — Ambulatory Visit (HOSPITAL_COMMUNITY)
Admission: RE | Admit: 2017-10-25 | Discharge: 2017-10-25 | Disposition: A | Discharge status: Home / Self Care | Payer: Medicare Other | Source: Ambulatory Visit | Attending: Neurology | Admitting: Neurology

## 2017-10-25 ENCOUNTER — Encounter (HOSPITAL_COMMUNITY): Payer: Self-pay

## 2017-10-26 ENCOUNTER — Other Ambulatory Visit: Payer: Self-pay

## 2017-10-26 NOTE — Patient Outreach (Signed)
[facility]) Care Management  10/26/2017  Lori Brady [birth year] [id number]   Medication Adherence call to Lori Brady patient is showing past due under [facility] Ins.on Simvastatin 40 mg spoke with [name] they said patient already pick up on 10/20/17 for a 90 days supply patient is not going to be due until February of 2019.   Lillia Abed CPhT Pharmacy Technician [facility] Care Management Direct Dial [phone]  Fax [phone] [email or web address]

## 2017-11-03 ENCOUNTER — Ambulatory Visit (HOSPITAL_COMMUNITY)
Admission: RE | Admit: 2017-11-03 | Discharge: 2017-11-03 | Disposition: A | Discharge status: Home / Self Care | Payer: Medicare Other | Source: Ambulatory Visit | Attending: Neurology | Admitting: Neurology

## 2017-11-03 DIAGNOSIS — I6782 Cerebral ischemia: Secondary | ICD-10-CM | POA: Diagnosis not present

## 2017-11-03 DIAGNOSIS — G20 Parkinson's disease: Secondary | ICD-10-CM | POA: Diagnosis present

## 2017-11-03 DIAGNOSIS — I6930 Unspecified sequelae of cerebral infarction: Secondary | ICD-10-CM | POA: Diagnosis not present

## 2017-11-15 ENCOUNTER — Ambulatory Visit: Payer: Medicare Other | Attending: Neurology | Admitting: Speech Pathology

## 2017-11-15 ENCOUNTER — Other Ambulatory Visit: Payer: Self-pay

## 2017-11-15 ENCOUNTER — Encounter: Payer: Self-pay | Admitting: Speech Pathology

## 2017-11-15 DIAGNOSIS — R490 Dysphonia: Secondary | ICD-10-CM | POA: Diagnosis not present

## 2017-11-15 NOTE — Therapy (Signed)
[facility] MAIN [facility] SERVICES [address] [place], [place], [address] Phone: [phone]   Fax:  [phone]  Speech Language Pathology Evaluation  Patient Details  Name: Lori Brady MRN: [id number] Date of Birth: [birth year] Referring Provider: Dr. Sherryll Burger   Encounter Date: 11/15/2017  End of Session - 11/15/17 1301    Visit Number  1    Number of Visits  16    Date for SLP Re-Evaluation  01/13/18    SLP Start Time  1000    SLP Stop Time   1052    SLP Time Calculation (min)  52 min    Activity Tolerance  Patient tolerated treatment well       Past Medical History:  Diagnosis Date  . Arthritis   . Asthma   . Crohn's disease (HCC)   . Emphysema of lung (HCC)   . GERD (gastroesophageal reflux disease)   . Hip fracture (HCC) 01/26/2017  . History of chicken pox   . History of heart attack   . Hx of completed stroke   . Hypercholesteremia   . Hypertension   . Pneumonia   . Seasonal allergies   . Sepsis (HCC)   . Septic shock (HCC) 10/08/2016  . UTI (lower urinary tract infection)     Past Surgical History:  Procedure Laterality Date  . BREAST BIOPSY Bilateral 1960's to 90's  . CATARACT EXTRACTION, BILATERAL      There were no vitals filed for this visit.      SLP Evaluation [facility] - 11/15/17 0001      SLP Visit Information   SLP Received On  11/15/17    Referring Provider  Dr. Sherryll Burger    Onset Date  10/19/2017    Medical Diagnosis  paralyzed vocal cord, Parkinson's      Subjective   Subjective  Patient reports that poor ability to make herself heard.    Patient/Family Stated Goal  Loud enough to be heard      General Information   HPI  71 year old woman diagnosed with paralyzed vocal cord (secondary stroke in 2007) and recent diagnosis of Parkinson's disease       Prior Functional Status   Cognitive/Linguistic Baseline  Within functional limits      Oral Motor/Sensory Function   Overall Oral Motor/Sensory Function   Appears within functional limits for tasks assessed      Motor Speech   Overall Motor Speech  Impaired    Respiration  Impaired    Level of Impairment  Conversation    Phonation  Hoarse;Low vocal intensity    Resonance  Within functional limits    Articulation  Within functional limitis    Intelligibility  Intelligible    Phonation  Impaired    Vocal Abuses  Habitual Cough/Throat Clear    Volume  Soft    Pitch  Appropriate      Standardized Assessments   Standardized Assessments   Other Assessment Perceptual Voice Evaluation        Perceptual Voice Evaluation Maximum phonation time for sustained "ah": 9 seconds Mean intensity during sustained "ah": 66 dB  Mean intensity sustained during conversational speech: 60 dB Highest dynamic pitch when altering pitch from a low note to a high note: 344 Hz Highest pitch during conversational speech: 203 Hz Lowest dynamic pitch when altering from a high note to a low note: 162 Hz Lowest pitch during conversational speech: 164 Hz  Average fundamental frequency during sustained "ah": 217  Hz (within 1 STD for age and gender) Visi-Pitch: Pharmacist, community (MDVP)  MDVPT extracts objective quantitative values (Relative Average Perturbation, Shimmer, Voice Turbulence Index, and Noise to Harmonic Ratio) on sustained phonation, which are displayed graphically and numerically in comparison to a built-in normative database.  The patient exhibited values within the norm for Relative Average Perturbation, Shimmer, Voice Turbulence Index, and Noise to Harmonic Ratio.  Average fundamental frequency was average for age and gender. Perceptually, her vocal quality was muffled.  The patient improved all parameters resonant voice techniques.  Simulatability: Improved vocal quality with resonant voice.  SLP Education - 12-04-17 1301    Education provided  Yes    Education Details  Voice therapy vs. LSVT-LOUD    Person(s) Educated  Patient     Methods  Explanation    Comprehension  Verbalized understanding         SLP Long Term Goals - 12/04/2017 1304      SLP LONG TERM GOAL #1   Title  The patient will be independent for abdominal breathing and breath support exercises.    Time  8    Period  Weeks    Status  New    Target Date  01/13/18      SLP LONG TERM GOAL #2   Title  The patient will maximize voice quality and loudness using breath support/oral resonance for sustained vowel production, pitch glides, and hierarchal speech drill.    Time  8    Period  Weeks    Status  New    Target Date  01/13/18      SLP LONG TERM GOAL #3   Title  The patient will maximize voice quality and loudness using breath support/oral resonance for paragraph length recitation with 80% accuracy.    Time  8    Period  Weeks    Status  New    Target Date  01/13/18       Plan - 12-04-17 1302    Clinical Impression Statement  This 71 year old woman with paralyzed vocal cord (secondary stroke in 2007) and recent diagnosis of Parkinson's disease is presenting with moderate voice disorder characterized by hoarse vocal quality, hypophonia, monotone voice, reduced breath support / control for speech, reduced pitch range, and vocal fatigue.  The patient will benefit from voice therapy for education, to improve breath control/support for speech, and learn techniques to increase loudness and pitch range without strain.   .  Based on stimulability testing, the patient is judged to be a good candidate for voice therapy.      Speech Therapy Frequency  2x / week    Duration  Other (comment) 8 weeks    Potential to Achieve Goals  Good    Potential Considerations  Ability to learn/carryover information;Co-morbidities;Cooperation/participation level;Medical prognosis;Previous level of function;Severity of impairments;Family/community support    SLP Home Exercise Plan  Breath support exercises, resonant voice exercises    Consulted and Agree with Plan of Care   Patient       Patient will benefit from skilled therapeutic intervention in order to improve the following deficits and impairments:   Dysphonia - Plan: SLP plan of care cert/re-cert  G-Codes - 2017-12-04 1305    Functional Assessment Tool Used  Perceptual voice evaluation    Functional Limitations  Voice    Voice Current Status (G9171)  At least 40 percent but less than 60 percent impaired, limited or restricted    Voice Goal Status (G9562)  At  least 20 percent but less than 40 percent impaired, limited or restricted       Problem List Patient Active Problem List   Diagnosis Date Noted  . SIADH (syndrome of inappropriate ADH production) (HCC) 10/01/2017  . Spinal stenosis 10/01/2017  . COPD (chronic obstructive pulmonary disease) with chronic bronchitis (HCC) 08/12/2016  . Asthma without status asthmaticus 03/31/2016  . Osteoporosis 03/31/2016  . Fatty liver determined by biopsy 09/17/2015  . Parotid swelling 09/13/2015  . Urticaria 09/13/2015  . Cerebrovascular accident (CVA) (HCC) 08/21/2015  . Senile purpura (HCC) 08/21/2015  . Atrial tachycardia (HCC) 08/13/2015  . Eczema 07/10/2015  . Allergic rhinitis 06/26/2015  . Hyperkalemia 05/03/2015  . Anxiety 04/23/2015  . Edema leg 03/27/2015  . Primary cardiomyopathy (HCC) 03/05/2015  . Benign essential HTN 02/28/2015  . Combined fat and carbohydrate induced hyperlipemia 09/06/2014  . COPD, severe (HCC) 08/01/2014  . Acid reflux 08/01/2014   [name], MS/CCC- SLP  Leandrew Koyanagi 11/15/2017, 1:07 PM  [place] [facility] MAIN [facility] SERVICES [address] [place], [place], [address] Phone: [phone]   Fax:  [phone]  Name: JONNETTE STOCKETT MRN: [id number] Date of Birth: 1946-04-11

## 2017-11-25 ENCOUNTER — Other Ambulatory Visit: Payer: Self-pay | Admitting: Physician Assistant

## 2017-11-25 ENCOUNTER — Encounter: Payer: Self-pay | Admitting: Speech Pathology

## 2017-11-25 ENCOUNTER — Other Ambulatory Visit: Payer: Self-pay

## 2017-11-25 ENCOUNTER — Ambulatory Visit: Payer: Medicare Other | Attending: Neurology | Admitting: Speech Pathology

## 2017-11-25 DIAGNOSIS — R490 Dysphonia: Secondary | ICD-10-CM | POA: Diagnosis not present

## 2017-11-25 MED ORDER — MONTELUKAST SODIUM 10 MG PO TABS
10.0000 mg | ORAL_TABLET | Freq: Every day | ORAL | 1 refills | Status: DC
Start: 2017-11-25 — End: 2018-08-22

## 2017-11-25 NOTE — Telephone Encounter (Signed)
[facility] faxed refill request for the following medications:  montelukast (SINGULAIR) 10 MG tablet  90 day supply  Last Rx: 06/30/17 (90 day supply 1 refills) LOV: 10/01/17 Please advise. Thanks TNP

## 2017-11-25 NOTE — Therapy (Signed)
[place] MAIN [facility] SERVICES [address] [place], [place], [address] Phone: [phone]   Fax:  [phone]  Speech Language Pathology Treatment  Patient Details  Name: Lori Brady MRN: [id number] Date of Birth: [birth year] Referring Provider: Dr. Sherryll Burger   Encounter Date: 11/25/2017  End of Session - 11/25/17 1344    Visit Number  2    Number of Visits  16    Date for SLP Re-Evaluation  01/13/18    SLP Start Time  1000    SLP Stop Time   1050    SLP Time Calculation (min)  50 min    Activity Tolerance  Patient tolerated treatment well       Past Medical History:  Diagnosis Date  . Arthritis   . Asthma   . Crohn's disease (HCC)   . Emphysema of lung (HCC)   . GERD (gastroesophageal reflux disease)   . Hip fracture (HCC) 01/26/2017  . History of chicken pox   . History of heart attack   . Hx of completed stroke   . Hypercholesteremia   . Hypertension   . Pneumonia   . Seasonal allergies   . Sepsis (HCC)   . Septic shock (HCC) 10/08/2016  . UTI (lower urinary tract infection)     Past Surgical History:  Procedure Laterality Date  . BREAST BIOPSY Bilateral 1960's to 90's  . CATARACT EXTRACTION, BILATERAL      There were no vitals filed for this visit.  Subjective Assessment - 11/25/17 1343    Subjective  "I feel like I'm shouting, but I know I'm too quiet"    Currently in Pain?  No/denies            ADULT SLP TREATMENT - 11/25/17 0001      General Information   Behavior/Cognition  Alert;Cooperative;Pleasant mood    HPI   72 year old woman diagnosed with paralyzed vocal cord (secondary stroke in 2007) and recent diagnosis of Parkinson's disease        Treatment Provided   Treatment provided  Cognitive-Linquistic      Pain Assessment   Pain Assessment  No/denies pain      Cognitive-Linquistic Treatment   Treatment focused on  Voice    Skilled Treatment  LOUD "AH":  Sustain 'ah' (with good quality voice) at  average 75 dB for average 8 seconds. PITCH GLIDES: Shift low-to-high with loud, good quality voice with 80% accuracy.  Shift high-to-low with loud, good quality voice with 80% accuracy.  HIERARCHIAL SPEECH DRILL:  Maintain average 68 dB and good quality voice with recitation and reading sentences with 80% accuracy.      Assessment / Recommendations / Plan   Plan  Continue with current plan of care      Progression Toward Goals   Progression toward goals  Progressing toward goals       SLP Education - 11/25/17 1343    Education provided  Yes    Education Details  voice production, vocal loudness, breath support    Person(s) Educated  Patient    Methods  Explanation    Comprehension  Verbalized understanding         SLP Long Term Goals - 11/15/17 1304      SLP LONG TERM GOAL #1   Title  The patient will be independent for abdominal breathing and breath support exercises.    Time  8    Period  Weeks    Status  New  Target Date  01/13/18      SLP LONG TERM GOAL #2   Title  The patient will maximize voice quality and loudness using breath support/oral resonance for sustained vowel production, pitch glides, and hierarchal speech drill.    Time  8    Period  Weeks    Status  New    Target Date  01/13/18      SLP LONG TERM GOAL #3   Title  The patient will maximize voice quality and loudness using breath support/oral resonance for paragraph length recitation with 80% accuracy.    Time  8    Period  Weeks    Status  New    Target Date  01/13/18       Plan - 11/25/17 1344    Clinical Impression Statement  The patient is able to generate a louder, better quality voice with focus on vocal loudness.    Speech Therapy Frequency  2x / week    Duration  Other (comment)    Treatment/Interventions  Patient/family education;Other (comment) Voice therapy    Potential to Achieve Goals  Good    Potential Considerations  Ability to learn/carryover  information;Co-morbidities;Cooperation/participation level;Medical prognosis;Previous level of function;Severity of impairments;Family/community support    SLP Home Exercise Plan  Breath support exercises, resonant voice exercises, voice building routine    Consulted and Agree with Plan of Care  Patient       Patient will benefit from skilled therapeutic intervention in order to improve the following deficits and impairments:   Dysphonia    Problem List Patient Active Problem List   Diagnosis Date Noted  . SIADH (syndrome of inappropriate ADH production) (HCC) 10/01/2017  . Spinal stenosis 10/01/2017  . COPD (chronic obstructive pulmonary disease) with chronic bronchitis (HCC) 08/12/2016  . Asthma without status asthmaticus 03/31/2016  . Osteoporosis 03/31/2016  . Fatty liver determined by biopsy 09/17/2015  . Parotid swelling 09/13/2015  . Urticaria 09/13/2015  . Cerebrovascular accident (CVA) (HCC) 08/21/2015  . Senile purpura (HCC) 08/21/2015  . Atrial tachycardia (HCC) 08/13/2015  . Eczema 07/10/2015  . Allergic rhinitis 06/26/2015  . Hyperkalemia 05/03/2015  . Anxiety 04/23/2015  . Edema leg 03/27/2015  . Primary cardiomyopathy (HCC) 03/05/2015  . Benign essential HTN 02/28/2015  . Combined fat and carbohydrate induced hyperlipemia 09/06/2014  . COPD, severe (HCC) 08/01/2014  . Acid reflux 08/01/2014   [name], MS/CCC- SLP  Leandrew Koyanagi 11/25/2017, 1:46 PM  [place] [facility] MAIN [facility] SERVICES [address] [place], [place], [address] Phone: [phone]   Fax:  [phone]   Name: Lori Brady MRN: [id number] Date of Birth: 02-26-1946

## 2017-12-01 ENCOUNTER — Ambulatory Visit: Payer: Medicare Other | Admitting: Speech Pathology

## 2017-12-01 ENCOUNTER — Encounter: Payer: Self-pay | Admitting: Speech Pathology

## 2017-12-01 DIAGNOSIS — R490 Dysphonia: Secondary | ICD-10-CM | POA: Diagnosis not present

## 2017-12-01 NOTE — Therapy (Signed)
[place] [facility] MAIN [facility] SERVICES [address] [place], [place], [address] Phone: [phone]   Fax:  [phone]  Speech Language Pathology Treatment  Patient Details  Name: Lori Brady MRN: [id number] Date of Birth: [birth year] Referring Provider: Dr. Sherryll Burger   Encounter Date: 12/01/2017  End of Session - 12/01/17 1056    Visit Number  3    Number of Visits  16    Date for SLP Re-Evaluation  01/13/18    SLP Start Time  1004    SLP Stop Time   1057    SLP Time Calculation (min)  53 min    Activity Tolerance  Patient tolerated treatment well       Past Medical History:  Diagnosis Date  . Arthritis   . Asthma   . Crohn's disease (HCC)   . Emphysema of lung (HCC)   . GERD (gastroesophageal reflux disease)   . Hip fracture (HCC) 01/26/2017  . History of chicken pox   . History of heart attack   . Hx of completed stroke   . Hypercholesteremia   . Hypertension   . Pneumonia   . Seasonal allergies   . Sepsis (HCC)   . Septic shock (HCC) 10/08/2016  . UTI (lower urinary tract infection)     Past Surgical History:  Procedure Laterality Date  . BREAST BIOPSY Bilateral 1960's to 90's  . CATARACT EXTRACTION, BILATERAL      There were no vitals filed for this visit.  Subjective Assessment - 12/01/17 1056    Subjective  "I feel like I'm shouting, but I know I'm too quiet"            ADULT SLP TREATMENT - 12/01/17 0001      General Information   Behavior/Cognition  Alert;Cooperative;Pleasant mood    HPI   72 year old woman diagnosed with paralyzed vocal cord (secondary stroke in 2007) and recent diagnosis of Parkinson's disease        Treatment Provided   Treatment provided  Cognitive-Linquistic      Pain Assessment   Pain Assessment  No/denies pain      Cognitive-Linquistic Treatment   Treatment focused on  Voice    Skilled Treatment  LOUD "AH":  Sustain 'ah' (with good quality voice) at average 79 dB for average 9  seconds. PITCH GLIDES: Shift low-to-high with loud, good quality voice with 80% accuracy.  Shift high-to-low with loud, good quality voice with 80% accuracy.  HIERARCHIAL SPEECH DRILL:  Maintain average 68 dB and good quality voice with recitation and reading sentences with 80% accuracy.  Maintain 68 dB with good quality voice for words and phrases with 80% accuracy.      Assessment / Recommendations / Plan   Plan  Continue with current plan of care      Progression Toward Goals   Progression toward goals  Progressing toward goals       SLP Education - 12/01/17 1056    Education provided  Yes    Education Details  vocal loudness    Person(s) Educated  Patient    Methods  Explanation    Comprehension  Verbalized understanding         SLP Long Term Goals - 11/15/17 1304      SLP LONG TERM GOAL #1   Title  The patient will be independent for abdominal breathing and breath support exercises.    Time  8    Period  Weeks  Status  New    Target Date  01/13/18      SLP LONG TERM GOAL #2   Title  The patient will maximize voice quality and loudness using breath support/oral resonance for sustained vowel production, pitch glides, and hierarchal speech drill.    Time  8    Period  Weeks    Status  New    Target Date  01/13/18      SLP LONG TERM GOAL #3   Title  The patient will maximize voice quality and loudness using breath support/oral resonance for paragraph length recitation with 80% accuracy.    Time  8    Period  Weeks    Status  New    Target Date  01/13/18       Plan - 12/01/17 1057    Clinical Impression Statement  The patient is able to generate a louder, better quality voice with focus on vocal loudness.    Speech Therapy Frequency  2x / week    Duration  Other (comment)    Treatment/Interventions  Patient/family education;Other (comment) Voice therapy    Potential to Achieve Goals  Good    Potential Considerations  Ability to learn/carryover  information;Co-morbidities;Cooperation/participation level;Medical prognosis;Previous level of function;Severity of impairments;Family/community support    SLP Home Exercise Plan  Breath support exercises, resonant voice exercises, voice building routine    Consulted and Agree with Plan of Care  Patient       Patient will benefit from skilled therapeutic intervention in order to improve the following deficits and impairments:   Dysphonia    Problem List Patient Active Problem List   Diagnosis Date Noted  . SIADH (syndrome of inappropriate ADH production) (HCC) 10/01/2017  . Spinal stenosis 10/01/2017  . COPD (chronic obstructive pulmonary disease) with chronic bronchitis (HCC) 08/12/2016  . Asthma without status asthmaticus 03/31/2016  . Osteoporosis 03/31/2016  . Fatty liver determined by biopsy 09/17/2015  . Parotid swelling 09/13/2015  . Urticaria 09/13/2015  . Cerebrovascular accident (CVA) (HCC) 08/21/2015  . Senile purpura (HCC) 08/21/2015  . Atrial tachycardia (HCC) 08/13/2015  . Eczema 07/10/2015  . Allergic rhinitis 06/26/2015  . Hyperkalemia 05/03/2015  . Anxiety 04/23/2015  . Edema leg 03/27/2015  . Primary cardiomyopathy (HCC) 03/05/2015  . Benign essential HTN 02/28/2015  . Combined fat and carbohydrate induced hyperlipemia 09/06/2014  . COPD, severe (HCC) 08/01/2014  . Acid reflux 08/01/2014   [name], MS/CCC- SLP  Leandrew Koyanagi 12/01/2017, 10:57 AM  [place] [facility] MAIN [facility] SERVICES [address] [place], [place], [address] Phone: [phone]   Fax:  [phone]   Name: Lori Brady MRN: [id number] Date of Birth: [birth year]

## 2017-12-03 ENCOUNTER — Encounter: Payer: Medicare Other | Admitting: Speech Pathology

## 2017-12-07 ENCOUNTER — Encounter: Payer: Self-pay | Admitting: Speech Pathology

## 2017-12-07 ENCOUNTER — Ambulatory Visit: Payer: Medicare Other | Admitting: Speech Pathology

## 2017-12-07 DIAGNOSIS — R490 Dysphonia: Secondary | ICD-10-CM

## 2017-12-07 NOTE — Therapy (Signed)
[facility] MAIN [facility] SERVICES [address] [place], [place], [address] Phone: [phone]   Fax:  [phone]  Speech Language Pathology Treatment  Patient Details  Name: Lori Brady MRN: [id number] Date of Birth: 1946-03-09 Referring Provider: Dr. Sherryll Burger   Encounter Date: 12/07/2017  End of Session - 12/07/17 1433    Visit Number  4    Number of Visits  16    Date for SLP Re-Evaluation  01/13/18    SLP Start Time  1255    SLP Stop Time   1347    SLP Time Calculation (min)  52 min    Activity Tolerance  Patient tolerated treatment well       Past Medical History:  Diagnosis Date  . Arthritis   . Asthma   . Crohn's disease (HCC)   . Emphysema of lung (HCC)   . GERD (gastroesophageal reflux disease)   . Hip fracture (HCC) 01/26/2017  . History of chicken pox   . History of heart attack   . Hx of completed stroke   . Hypercholesteremia   . Hypertension   . Pneumonia   . Seasonal allergies   . Sepsis (HCC)   . Septic shock (HCC) 10/08/2016  . UTI (lower urinary tract infection)     Past Surgical History:  Procedure Laterality Date  . BREAST BIOPSY Bilateral 1960's to 90's  . CATARACT EXTRACTION, BILATERAL      There were no vitals filed for this visit.         ADULT SLP TREATMENT - 12/07/17 0001      General Information   Behavior/Cognition  Alert;Cooperative;Pleasant mood    HPI   72 year old woman diagnosed with paralyzed vocal cord (secondary stroke in 2007) and recent diagnosis of Parkinson's disease        Treatment Provided   Treatment provided  Cognitive-Linquistic      Pain Assessment   Pain Assessment  No/denies pain      Cognitive-Linquistic Treatment   Treatment focused on  Voice    Skilled Treatment  LOUD "AH":  Sustain 'ah' (with good quality voice) at average 83 dB for average 11 seconds. PITCH GLIDES: Shift low-to-high with loud, good quality voice X5.  Shift high-to-low with loud, good quality  voice X5.  HIERARCHIAL SPEECH DRILL:  Maintain average 68 dB and good quality voice with recitation and reading sentences with 80% accuracy.  Maintain 68 dB with good quality voice for words and phrases with 80% accuracy.  Conversational turns with average 65 dB.      Assessment / Recommendations / Plan   Plan  Continue with current plan of care      Progression Toward Goals   Progression toward goals  Progressing toward goals       SLP Education - 12/07/17 1433    Education provided  Yes    Education Details  vocal loudness    Person(s) Educated  Patient    Methods  Explanation    Comprehension  Verbalized understanding         SLP Long Term Goals - 11/15/17 1304      SLP LONG TERM GOAL #1   Title  The patient will be independent for abdominal breathing and breath support exercises.    Time  8    Period  Weeks    Status  New    Target Date  01/13/18      SLP LONG TERM GOAL #2  Title  The patient will maximize voice quality and loudness using breath support/oral resonance for sustained vowel production, pitch glides, and hierarchal speech drill.    Time  8    Period  Weeks    Status  New    Target Date  01/13/18      SLP LONG TERM GOAL #3   Title  The patient will maximize voice quality and loudness using breath support/oral resonance for paragraph length recitation with 80% accuracy.    Time  8    Period  Weeks    Status  New    Target Date  01/13/18       Plan - 12/07/17 1434    Clinical Impression Statement  The patient is able to generate a louder, better quality voice with focus on vocal loudness.  This aids vocal quality due to vocal fold paralysis as well as hypophonia due to Parkinson's.    Speech Therapy Frequency  2x / week    Duration  Other (comment)    Treatment/Interventions  Patient/family education;Other (comment) Voice therapy    Potential to Achieve Goals  Good    Potential Considerations  Ability to learn/carryover  information;Co-morbidities;Cooperation/participation level;Medical prognosis;Previous level of function;Severity of impairments;Family/community support    SLP Home Exercise Plan  Breath support exercises, resonant voice exercises, voice building routine    Consulted and Agree with Plan of Care  Patient       Patient will benefit from skilled therapeutic intervention in order to improve the following deficits and impairments:   Dysphonia    Problem List Patient Active Problem List   Diagnosis Date Noted  . SIADH (syndrome of inappropriate ADH production) (HCC) 10/01/2017  . Spinal stenosis 10/01/2017  . COPD (chronic obstructive pulmonary disease) with chronic bronchitis (HCC) 08/12/2016  . Asthma without status asthmaticus 03/31/2016  . Osteoporosis 03/31/2016  . Fatty liver determined by biopsy 09/17/2015  . Parotid swelling 09/13/2015  . Urticaria 09/13/2015  . Cerebrovascular accident (CVA) (HCC) 08/21/2015  . Senile purpura (HCC) 08/21/2015  . Atrial tachycardia (HCC) 08/13/2015  . Eczema 07/10/2015  . Allergic rhinitis 06/26/2015  . Hyperkalemia 05/03/2015  . Anxiety 04/23/2015  . Edema leg 03/27/2015  . Primary cardiomyopathy (HCC) 03/05/2015  . Benign essential HTN 02/28/2015  . Combined fat and carbohydrate induced hyperlipemia 09/06/2014  . COPD, severe (HCC) 08/01/2014  . Acid reflux 08/01/2014   [name], MS/CCC- SLP  Leandrew Koyanagi 12/07/2017, 2:35 PM  [facility] MAIN [facility] SERVICES [address] [place], [place], [address] Phone: [phone]   Fax:  [phone]   Name: Lori Brady MRN: [id number] Date of Birth: 1946/03/17

## 2017-12-08 ENCOUNTER — Telehealth: Payer: Self-pay

## 2017-12-08 NOTE — Telephone Encounter (Signed)
No answer or VM set up on either numbers on file. Will CB later to try and schedule AWV with [facility]. -MM

## 2017-12-09 ENCOUNTER — Ambulatory Visit: Payer: Medicare Other | Admitting: Speech Pathology

## 2017-12-09 ENCOUNTER — Encounter: Payer: Self-pay | Admitting: Speech Pathology

## 2017-12-09 DIAGNOSIS — R490 Dysphonia: Secondary | ICD-10-CM

## 2017-12-09 NOTE — Therapy (Signed)
[facility] MAIN [facility] SERVICES [address] [place], [place], [address] Phone: [phone]   Fax:  [phone]  Speech Language Pathology Treatment  Patient Details  Name: Lori Brady MRN: [id number] Date of Birth: Nov 29, 1945 Referring Provider: Dr. Sherryll Burger   Encounter Date: 12/09/2017  End of Session - 12/09/17 1355    Visit Number  5    Number of Visits  16    Date for SLP Re-Evaluation  01/13/18    SLP Start Time  1255    SLP Stop Time   1346    SLP Time Calculation (min)  51 min    Activity Tolerance  Patient tolerated treatment well       Past Medical History:  Diagnosis Date  . Arthritis   . Asthma   . Crohn's disease (HCC)   . Emphysema of lung (HCC)   . GERD (gastroesophageal reflux disease)   . Hip fracture (HCC) 01/26/2017  . History of chicken pox   . History of heart attack   . Hx of completed stroke   . Hypercholesteremia   . Hypertension   . Pneumonia   . Seasonal allergies   . Sepsis (HCC)   . Septic shock (HCC) 10/08/2016  . UTI (lower urinary tract infection)     Past Surgical History:  Procedure Laterality Date  . BREAST BIOPSY Bilateral 1960's to 90's  . CATARACT EXTRACTION, BILATERAL      There were no vitals filed for this visit.  Subjective Assessment - 12/09/17 1355    Subjective  Patient reports that family and friends say that she is louder and easier to understand            ADULT SLP TREATMENT - 12/09/17 0001      General Information   Behavior/Cognition  Alert;Cooperative;Pleasant mood    HPI   72 year old woman diagnosed with paralyzed vocal cord (secondary stroke in 2007) and recent diagnosis of Parkinson's disease        Treatment Provided   Treatment provided  Cognitive-Linquistic      Pain Assessment   Pain Assessment  No/denies pain      Cognitive-Linquistic Treatment   Treatment focused on  Voice    Skilled Treatment  LOUD "AH":  Sustain 'ah' (with good quality voice) at  average 80 dB for average 10 seconds. PITCH GLIDES: Shift low-to-high with loud, good quality voice X5.  Shift high-to-low with loud, good quality voice X5.  HIERARCHIAL SPEECH DRILL:  Maintain average 68 dB and good quality voice with recitation and reading sentences with 80% accuracy.  Maintain 68 dB with good quality voice for words and phrases with 80% accuracy.  Conversational turns with average 65 dB.      Assessment / Recommendations / Plan   Plan  Continue with current plan of care      Progression Toward Goals   Progression toward goals  Progressing toward goals       SLP Education - 12/09/17 1355    Education provided  Yes    Education Details  vocal loudness    Person(s) Educated  Patient    Methods  Explanation    Comprehension  Verbalized understanding         SLP Long Term Goals - 11/15/17 1304      SLP LONG TERM GOAL #1   Title  The patient will be independent for abdominal breathing and breath support exercises.    Time  8  Period  Weeks    Status  New    Target Date  01/13/18      SLP LONG TERM GOAL #2   Title  The patient will maximize voice quality and loudness using breath support/oral resonance for sustained vowel production, pitch glides, and hierarchal speech drill.    Time  8    Period  Weeks    Status  New    Target Date  01/13/18      SLP LONG TERM GOAL #3   Title  The patient will maximize voice quality and loudness using breath support/oral resonance for paragraph length recitation with 80% accuracy.    Time  8    Period  Weeks    Status  New    Target Date  01/13/18       Plan - 12/09/17 1356    Clinical Impression Statement  The patient is able to generate a louder, better quality voice with focus on vocal loudness.  This aids vocal quality due to vocal fold paralysis as well as hypophonia due to Parkinson's.    Speech Therapy Frequency  2x / week    Duration  Other (comment)    Treatment/Interventions  Patient/family education;Other  (comment) Voice therapy    Potential to Achieve Goals  Good    Potential Considerations  Ability to learn/carryover information;Co-morbidities;Cooperation/participation level;Medical prognosis;Previous level of function;Severity of impairments;Family/community support    SLP Home Exercise Plan  Breath support exercises, resonant voice exercises, voice building routine    Consulted and Agree with Plan of Care  Patient       Patient will benefit from skilled therapeutic intervention in order to improve the following deficits and impairments:   Dysphonia    Problem List Patient Active Problem List   Diagnosis Date Noted  . SIADH (syndrome of inappropriate ADH production) (HCC) 10/01/2017  . Spinal stenosis 10/01/2017  . COPD (chronic obstructive pulmonary disease) with chronic bronchitis (HCC) 08/12/2016  . Asthma without status asthmaticus 03/31/2016  . Osteoporosis 03/31/2016  . Fatty liver determined by biopsy 09/17/2015  . Parotid swelling 09/13/2015  . Urticaria 09/13/2015  . Cerebrovascular accident (CVA) (HCC) 08/21/2015  . Senile purpura (HCC) 08/21/2015  . Atrial tachycardia (HCC) 08/13/2015  . Eczema 07/10/2015  . Allergic rhinitis 06/26/2015  . Hyperkalemia 05/03/2015  . Anxiety 04/23/2015  . Edema leg 03/27/2015  . Primary cardiomyopathy (HCC) 03/05/2015  . Benign essential HTN 02/28/2015  . Combined fat and carbohydrate induced hyperlipemia 09/06/2014  . COPD, severe (HCC) 08/01/2014  . Acid reflux 08/01/2014   Lori Primrose, MS/CCC- SLP  Leandrew Koyanagi 12/09/2017, 1:57 PM  [facility] MAIN [facility] SERVICES [address] [place], [place], [address] Phone: [phone]   Fax:  [phone]   Name: Lori Brady MRN: [id number] Date of Birth: [birth year]

## 2017-12-14 ENCOUNTER — Encounter: Payer: Self-pay | Admitting: Speech Pathology

## 2017-12-14 ENCOUNTER — Ambulatory Visit: Payer: Medicare Other | Admitting: Speech Pathology

## 2017-12-14 DIAGNOSIS — R490 Dysphonia: Secondary | ICD-10-CM | POA: Diagnosis not present

## 2017-12-14 NOTE — Therapy (Signed)
[facility] MAIN [facility] SERVICES [address] [place], [place], [address] Phone: [phone]   Fax:  [phone]  Speech Language Pathology Treatment  Patient Details  Name: NIKKITA ADEYEMI MRN: [id number] Date of Birth: 1946/04/08 Referring Provider: Dr. Sherryll Burger   Encounter Date: 12/14/2017  End of Session - 12/14/17 1404    Visit Number  6    Number of Visits  16    Date for SLP Re-Evaluation  01/13/18    SLP Start Time  1300    SLP Stop Time   1348    SLP Time Calculation (min)  48 min    Activity Tolerance  Patient tolerated treatment well       Past Medical History:  Diagnosis Date  . Arthritis   . Asthma   . Crohn's disease (HCC)   . Emphysema of lung (HCC)   . GERD (gastroesophageal reflux disease)   . Hip fracture (HCC) 01/26/2017  . History of chicken pox   . History of heart attack   . Hx of completed stroke   . Hypercholesteremia   . Hypertension   . Pneumonia   . Seasonal allergies   . Sepsis (HCC)   . Septic shock (HCC) 10/08/2016  . UTI (lower urinary tract infection)     Past Surgical History:  Procedure Laterality Date  . BREAST BIOPSY Bilateral 1960's to 90's  . CATARACT EXTRACTION, BILATERAL      There were no vitals filed for this visit.  Subjective Assessment - 12/14/17 1404    Subjective  Patient reports that family and friends say that she is louder and easier to understand            ADULT SLP TREATMENT - 12/14/17 0001      General Information   Behavior/Cognition  Alert;Cooperative;Pleasant mood    HPI   72 year old woman diagnosed with paralyzed vocal cord (secondary stroke in 2007) and recent diagnosis of Parkinson's disease        Treatment Provided   Treatment provided  Cognitive-Linquistic      Pain Assessment   Pain Assessment  No/denies pain      Cognitive-Linquistic Treatment   Treatment focused on  Voice    Skilled Treatment  LOUD "AH":  Sustain 'ah' (with good quality voice) at  average 83 dB for average 10 seconds. PITCH GLIDES: High effort pitch glide with loud, good quality voice X10.  HIERARCHIAL SPEECH DRILL:  Maintain average 73 dB and good quality voice with recitation and 70 dB with good quality voice reading sentences with 80% accuracy.  Maintain 68 dB with good quality voice for generating words and phrases with 80% accuracy.  Conversational turns with average 65 dB.      Assessment / Recommendations / Plan   Plan  Continue with current plan of care      Progression Toward Goals   Progression toward goals  Progressing toward goals       SLP Education - 12/14/17 1404    Education provided  Yes    Education Details  vocal loudness    Person(s) Educated  Patient    Comprehension  Verbalized understanding         SLP Long Term Goals - 11/15/17 1304      SLP LONG TERM GOAL #1   Title  The patient will be independent for abdominal breathing and breath support exercises.    Time  8    Period  Weeks  Status  New    Target Date  01/13/18      SLP LONG TERM GOAL #2   Title  The patient will maximize voice quality and loudness using breath support/oral resonance for sustained vowel production, pitch glides, and hierarchal speech drill.    Time  8    Period  Weeks    Status  New    Target Date  01/13/18      SLP LONG TERM GOAL #3   Title  The patient will maximize voice quality and loudness using breath support/oral resonance for paragraph length recitation with 80% accuracy.    Time  8    Period  Weeks    Status  New    Target Date  01/13/18       Plan - 12/14/17 1405    Clinical Impression Statement  The patient is able to generate a louder, better quality voice with focus on vocal loudness and breath support.  This aids vocal quality due to vocal fold paralysis as well as hypophonia due to Parkinson's.    Speech Therapy Frequency  2x / week    Duration  Other (comment)    Treatment/Interventions  Patient/family education;Other (comment)  Voice therapy    Potential to Achieve Goals  Good    Potential Considerations  Ability to learn/carryover information;Co-morbidities;Cooperation/participation level;Medical prognosis;Previous level of function;Severity of impairments;Family/community support    SLP Home Exercise Plan  Breath support exercises, resonant voice exercises, voice building routine    Consulted and Agree with Plan of Care  Patient       Patient will benefit from skilled therapeutic intervention in order to improve the following deficits and impairments:   Dysphonia    Problem List Patient Active Problem List   Diagnosis Date Noted  . SIADH (syndrome of inappropriate ADH production) (HCC) 10/01/2017  . Spinal stenosis 10/01/2017  . COPD (chronic obstructive pulmonary disease) with chronic bronchitis (HCC) 08/12/2016  . Asthma without status asthmaticus 03/31/2016  . Osteoporosis 03/31/2016  . Fatty liver determined by biopsy 09/17/2015  . Parotid swelling 09/13/2015  . Urticaria 09/13/2015  . Cerebrovascular accident (CVA) (HCC) 08/21/2015  . Senile purpura (HCC) 08/21/2015  . Atrial tachycardia (HCC) 08/13/2015  . Eczema 07/10/2015  . Allergic rhinitis 06/26/2015  . Hyperkalemia 05/03/2015  . Anxiety 04/23/2015  . Edema leg 03/27/2015  . Primary cardiomyopathy (HCC) 03/05/2015  . Benign essential HTN 02/28/2015  . Combined fat and carbohydrate induced hyperlipemia 09/06/2014  . COPD, severe (HCC) 08/01/2014  . Acid reflux 08/01/2014    [name], MS/CCC- SLP  Leandrew Koyanagi 12/14/2017, 2:06 PM  [facility] MAIN [facility] SERVICES [address] [place], [place], [address] Phone: [phone]   Fax:  [phone]   Name: KAILI BIEBER MRN: [id number] Date of Birth: [birth year]

## 2017-12-14 NOTE — Telephone Encounter (Signed)
Already rescheduled for 01/04/18.

## 2017-12-16 ENCOUNTER — Ambulatory Visit: Payer: Medicare Other | Admitting: Speech Pathology

## 2017-12-16 ENCOUNTER — Encounter: Payer: Self-pay | Admitting: Speech Pathology

## 2017-12-16 DIAGNOSIS — R490 Dysphonia: Secondary | ICD-10-CM

## 2017-12-16 NOTE — Therapy (Signed)
[facility] MAIN [facility] SERVICES [address] [place], [place], [address] Phone: [phone]   Fax:  [phone]  Speech Language Pathology Treatment  Patient Details  Name: Lori Brady MRN: [id number] Date of Birth: [birth year] Referring Provider: Dr. Sherryll Burger   Encounter Date: 12/16/2017  End of Session - 12/16/17 1356    Visit Number  7    Number of Visits  16    Date for SLP Re-Evaluation  01/13/18    SLP Start Time  1300    SLP Stop Time   1357    SLP Time Calculation (min)  57 min    Activity Tolerance  Patient tolerated treatment well       Past Medical History:  Diagnosis Date  . Arthritis   . Asthma   . Crohn's disease (HCC)   . Emphysema of lung (HCC)   . GERD (gastroesophageal reflux disease)   . Hip fracture (HCC) 01/26/2017  . History of chicken pox   . History of heart attack   . Hx of completed stroke   . Hypercholesteremia   . Hypertension   . Pneumonia   . Seasonal allergies   . Sepsis (HCC)   . Septic shock (HCC) 10/08/2016  . UTI (lower urinary tract infection)     Past Surgical History:  Procedure Laterality Date  . BREAST BIOPSY Bilateral 1960's to 90's  . CATARACT EXTRACTION, BILATERAL      There were no vitals filed for this visit.  Subjective Assessment - 12/16/17 1356    Subjective  Patient reports that family and friends say that she is louder and easier to understand            ADULT SLP TREATMENT - 12/16/17 0001      General Information   Behavior/Cognition  Alert;Cooperative;Pleasant mood    HPI   72 year old woman diagnosed with paralyzed vocal cord (secondary stroke in 2007) and recent diagnosis of Parkinson's disease        Treatment Provided   Treatment provided  Cognitive-Linquistic      Pain Assessment   Pain Assessment  No/denies pain      Cognitive-Linquistic Treatment   Treatment focused on  Voice    Skilled Treatment  LOUD "AH":  Sustain 'ah' (with good quality voice) at  average 83 dB for average 10 seconds (X15). PITCH GLIDES: High effort pitch glide with loud, good quality voice X10.  HIERARCHIAL SPEECH DRILL:  Maintain average 73 dB and good quality voice with recitation and 70 dB with good quality voice reading sentences with 80% accuracy.  Maintain 70 dB with good quality voice for generating words and phrases with 80% accuracy.  Conversational turns with average 70 dB.      Assessment / Recommendations / Plan   Plan  Continue with current plan of care      Progression Toward Goals   Progression toward goals  Progressing toward goals       SLP Education - 12/16/17 1356    Education provided  Yes    Education Details  vocal loudness    Person(s) Educated  Patient    Methods  Explanation    Comprehension  Verbalized understanding         SLP Long Term Goals - 11/15/17 1304      SLP LONG TERM GOAL #1   Title  The patient will be independent for abdominal breathing and breath support exercises.    Time  8  Period  Weeks    Status  New    Target Date  01/13/18      SLP LONG TERM GOAL #2   Title  The patient will maximize voice quality and loudness using breath support/oral resonance for sustained vowel production, pitch glides, and hierarchal speech drill.    Time  8    Period  Weeks    Status  New    Target Date  01/13/18      SLP LONG TERM GOAL #3   Title  The patient will maximize voice quality and loudness using breath support/oral resonance for paragraph length recitation with 80% accuracy.    Time  8    Period  Weeks    Status  New    Target Date  01/13/18       Plan - 12/16/17 1357    Clinical Impression Statement  The patient is able to generate a louder, better quality voice with focus on vocal loudness and breath support.  This aids vocal quality due to vocal fold paralysis as well as hypophonia due to Parkinson's.  Patient reports getting positive feedback from family regarding her voice and ease of hearing her.    Speech  Therapy Frequency  2x / week    Duration  Other (comment)    Treatment/Interventions  Patient/family education;Other (comment) Voice therapy    Potential to Achieve Goals  Good    Potential Considerations  Ability to learn/carryover information;Co-morbidities;Cooperation/participation level;Medical prognosis;Previous level of function;Severity of impairments;Family/community support    SLP Home Exercise Plan  Breath support exercises, resonant voice exercises, voice building routine    Consulted and Agree with Plan of Care  Patient       Patient will benefit from skilled therapeutic intervention in order to improve the following deficits and impairments:   Dysphonia    Problem List Patient Active Problem List   Diagnosis Date Noted  . SIADH (syndrome of inappropriate ADH production) (HCC) 10/01/2017  . Spinal stenosis 10/01/2017  . COPD (chronic obstructive pulmonary disease) with chronic bronchitis (HCC) 08/12/2016  . Asthma without status asthmaticus 03/31/2016  . Osteoporosis 03/31/2016  . Fatty liver determined by biopsy 09/17/2015  . Parotid swelling 09/13/2015  . Urticaria 09/13/2015  . Cerebrovascular accident (CVA) (HCC) 08/21/2015  . Senile purpura (HCC) 08/21/2015  . Atrial tachycardia (HCC) 08/13/2015  . Eczema 07/10/2015  . Allergic rhinitis 06/26/2015  . Hyperkalemia 05/03/2015  . Anxiety 04/23/2015  . Edema leg 03/27/2015  . Primary cardiomyopathy (HCC) 03/05/2015  . Benign essential HTN 02/28/2015  . Combined fat and carbohydrate induced hyperlipemia 09/06/2014  . COPD, severe (HCC) 08/01/2014  . Acid reflux 08/01/2014   [name], MS/CCC- SLP  Leandrew Koyanagi 12/16/2017, 1:58 PM  [facility] MAIN [facility] SERVICES [address] [place], [place], [address] Phone: [phone]   Fax:  [phone]   Name: Lori Brady MRN: [id number] Date of Birth: 1946-08-11

## 2017-12-21 ENCOUNTER — Encounter: Payer: Self-pay | Admitting: Speech Pathology

## 2017-12-21 ENCOUNTER — Ambulatory Visit: Payer: Medicare Other | Attending: Neurology | Admitting: Speech Pathology

## 2017-12-21 DIAGNOSIS — R490 Dysphonia: Secondary | ICD-10-CM | POA: Diagnosis not present

## 2017-12-21 NOTE — Therapy (Signed)
[facility] [facility] MAIN [facility] SERVICES [address] [place], [place], [address] Phone: [phone]   Fax:  [phone]  Speech Language Pathology Treatment  Patient Details  Name: Lori Brady MRN: [id number] Date of Birth: 02/15/1946 Referring Provider: Dr. Sherryll Burger   Encounter Date: 12/21/2017  End of Session - 12/21/17 1355    Visit Number  8    Number of Visits  16    Date for SLP Re-Evaluation  01/13/18    SLP Start Time  1300    SLP Stop Time   1350    SLP Time Calculation (min)  50 min    Activity Tolerance  Patient tolerated treatment well       Past Medical History:  Diagnosis Date  . Arthritis   . Asthma   . Crohn's disease (HCC)   . Emphysema of lung (HCC)   . GERD (gastroesophageal reflux disease)   . Hip fracture (HCC) 01/26/2017  . History of chicken pox   . History of heart attack   . Hx of completed stroke   . Hypercholesteremia   . Hypertension   . Pneumonia   . Seasonal allergies   . Sepsis (HCC)   . Septic shock (HCC) 10/08/2016  . UTI (lower urinary tract infection)     Past Surgical History:  Procedure Laterality Date  . BREAST BIOPSY Bilateral 1960's to 90's  . CATARACT EXTRACTION, BILATERAL      There were no vitals filed for this visit.  Subjective Assessment - 12/21/17 1627    Subjective  Pt pleasant and cooperative with unfamiliar therapist    Currently in Pain?  No/denies            ADULT SLP TREATMENT - 12/21/17 1627      General Information   Behavior/Cognition  Alert;Cooperative;Pleasant mood    HPI   72 year old woman diagnosed with paralyzed vocal cord (secondary stroke in 2007) and recent diagnosis of Parkinson's disease        Treatment Provided   Treatment provided  Cognitive-Linguistic      Cognitive-Linquistic Treatment   Treatment focused on  Voice    Skilled Treatment LOUD "AH":  Sustain 'ah' (with good quality voice) at average 78 dB for average 8.4 seconds (X15).  PITCH  GLIDES: High effort pitch glide with loud, good quality voice X10.    HIERARCHIAL SPEECH DRILL:  Maintained average 66 dB and good quality voice during conversational turns.     Assessment / Recommendations / Plan   Plan  Continue with current plan of care      Progression Toward Goals   Progression toward goals  Progressing toward goals       SLP Education - 12/21/17 1354    Education provided  Yes    Education Details  vocal loudness, hydration, avoidance of talking over family members or other noise    Person(s) Educated  Patient    Methods  Explanation;Demonstration;Verbal cues    Comprehension  Verbalized understanding         SLP Long Term Goals - 12/21/17 1630      SLP LONG TERM GOAL #1   Title  The patient will be independent for abdominal breathing and breath support exercises.    Time  8    Period  Weeks    Status  On-going      SLP LONG TERM GOAL #2   Title  The patient will maximize voice quality and loudness using breath support/oral  resonance for sustained vowel production, pitch glides, and hierarchal speech drill.    Time  8    Period  Weeks    Status  On-going      SLP LONG TERM GOAL #3   Title  The patient will maximize voice quality and loudness using breath support/oral resonance for paragraph length recitation with 80% accuracy.    Time  8    Period  Weeks    Status  On-going       Plan - 12/21/17 1355    Clinical Impression Statement  Pt continues to benefit from skilled ST treatment for increased vocal quality and loudness, and decreased tension. She continues to make good progress toward established goals. Recommend continued ST intervention for improvement of functional voice production.    Speech Therapy Frequency  2x / week    Duration  -- 8 weeks    Treatment/Interventions  Patient/family education;Other (comment)    Potential to Achieve Goals  Good    Potential Considerations  Ability to learn/carryover  information;Co-morbidities;Cooperation/participation level;Medical prognosis;Previous level of function;Severity of impairments;Family/community support    SLP Home Exercise Plan  Breath support exercises, resonant voice exercises, voice building routine    Consulted and Agree with Plan of Care  Patient       Patient will benefit from skilled therapeutic intervention in order to improve the following deficits and impairments:   Dysphonia    Problem List Patient Active Problem List   Diagnosis Date Noted  . SIADH (syndrome of inappropriate ADH production) (HCC) 10/01/2017  . Spinal stenosis 10/01/2017  . COPD (chronic obstructive pulmonary disease) with chronic bronchitis (HCC) 08/12/2016  . Asthma without status asthmaticus 03/31/2016  . Osteoporosis 03/31/2016  . Fatty liver determined by biopsy 09/17/2015  . Parotid swelling 09/13/2015  . Urticaria 09/13/2015  . Cerebrovascular accident (CVA) (HCC) 08/21/2015  . Senile purpura (HCC) 08/21/2015  . Atrial tachycardia (HCC) 08/13/2015  . Eczema 07/10/2015  . Allergic rhinitis 06/26/2015  . Hyperkalemia 05/03/2015  . Anxiety 04/23/2015  . Edema leg 03/27/2015  . Primary cardiomyopathy (HCC) 03/05/2015  . Benign essential HTN 02/28/2015  . Combined fat and carbohydrate induced hyperlipemia 09/06/2014  . COPD, severe (HCC) 08/01/2014  . Acid reflux 08/01/2014   Jakorey Mcconathy B. Murvin Natal [facility], CCC-SLP Speech Language Pathologist  Leigh Aurora 12/21/2017, 4:31 PM   [facility] MAIN [facility] SERVICES [address] [place], [place], [address] Phone: [phone]   Fax:  [phone]   Name: Lori Brady MRN: [id number] Date of Birth: 1946-03-17

## 2017-12-22 ENCOUNTER — Other Ambulatory Visit: Payer: Self-pay | Admitting: Internal Medicine

## 2017-12-23 ENCOUNTER — Encounter: Payer: Self-pay | Admitting: Speech Pathology

## 2017-12-23 ENCOUNTER — Ambulatory Visit: Payer: Medicare Other | Admitting: Speech Pathology

## 2017-12-23 DIAGNOSIS — R490 Dysphonia: Secondary | ICD-10-CM

## 2017-12-23 NOTE — Therapy (Signed)
[place] [facility] MAIN [facility] SERVICES [address] [place], [place], [address] Phone: [phone]   Fax:  [phone]  Speech Language Pathology Treatment  Patient Details  Name: Lori Brady MRN: [id number] Date of Birth: 02-14-1946 Referring Provider: Dr. Sherryll Burger   Encounter Date: 12/23/2017  End of Session - 12/23/17 1354    Visit Number  9    Number of Visits  16    Date for SLP Re-Evaluation  01/13/18    SLP Start Time  1300    SLP Stop Time   1354    SLP Time Calculation (min)  54 min    Activity Tolerance  Patient tolerated treatment well       Past Medical History:  Diagnosis Date  . Arthritis   . Asthma   . Crohn's disease (HCC)   . Emphysema of lung (HCC)   . GERD (gastroesophageal reflux disease)   . Hip fracture (HCC) 01/26/2017  . History of chicken pox   . History of heart attack   . Hx of completed stroke   . Hypercholesteremia   . Hypertension   . Pneumonia   . Seasonal allergies   . Sepsis (HCC)   . Septic shock (HCC) 10/08/2016  . UTI (lower urinary tract infection)     Past Surgical History:  Procedure Laterality Date  . BREAST BIOPSY Bilateral 1960's to 90's  . CATARACT EXTRACTION, BILATERAL      There were no vitals filed for this visit.  Subjective Assessment - 12/23/17 1352    Subjective  Pt stated she has a cold today - increased congested, nonproductive cough noted    Currently in Pain?  No/denies            ADULT SLP TREATMENT - 12/23/17 0001      General Information   Behavior/Cognition  Alert;Cooperative;Pleasant mood    HPI   72 year old woman diagnosed with paralyzed vocal cord (secondary stroke in 2007) and recent diagnosis of Parkinson's disease        Treatment Provided   Treatment provided  Cognitive-Linquistic      Pain Assessment   Pain Assessment  No/denies pain      Cognitive-Linquistic Treatment   Treatment focused on  Voice    Skilled Treatment LOUD "AH":  Sustain 'ah'  (with good quality voice) at average 79 dB for average 7.5 seconds (X15).  PITCH GLIDES: High effort pitch glide with loud, good quality voice X10.    HIERARCHIAL SPEECH DRILL:  Maintain average 71 dB and good quality voice reading phrases and sentences. Maintained 70 dB with good quality voice for during conversation.  Pt exhibited more congested but nonproductive cough today, and reported she was fighting a cold. Pt was encouraged to rest and stay hydrated at home.  We practiced yawn-sigh to encourage elevated velum, relaxation and openness of the oropharyngeal cavities.          Assessment / Recommendations / Plan   Plan  Continue with current plan of care      Progression Toward Goals   Progression toward goals  Progressing toward goals       SLP Education - 12/23/17 1353    Education provided  Yes    Education Details  take care of yourself  - get rid of that cold! Don't strain, continue water intake, yawn-sigh to increase awareness of palatal elevation and stretching of throat    Person(s) Educated  Patient    Methods  Explanation;Demonstration;Verbal  cues    Comprehension  Verbalized understanding;Returned demonstration;Verbal cues required         SLP Long Term Goals - 12/23/17 1355      SLP LONG TERM GOAL #1   Title  The patient will be independent for abdominal breathing and breath support exercises.    Time  8    Period  Weeks    Status  On-going    Target Date  01/13/18      SLP LONG TERM GOAL #2   Title  The patient will maximize voice quality and loudness using breath support/oral resonance for sustained vowel production, pitch glides, and hierarchal speech drill.    Time  8    Period  Weeks    Status  On-going    Target Date  01/13/18      SLP LONG TERM GOAL #3   Title  The patient will maximize voice quality and loudness using breath support/oral resonance for paragraph length recitation with 80% accuracy.    Time  8    Period  Weeks    Status   On-going    Target Date  01/13/18       Plan - 12/23/17 1354    Clinical Impression Statement  Cold symptoms more evident this session, with more congestion and increased coughing noted. Pt continues to benefit from skilled ST treatment for increased vocal quality and loudness, and decreased tension. She continues to make good progress toward established goals. Recommend continued ST intervention for improvement of functional voice production.    Speech Therapy Frequency  2x / week    Duration  -- 8 weeks    Treatment/Interventions  Patient/family education    Potential to Achieve Goals  Good    Potential Considerations  Ability to learn/carryover information;Co-morbidities;Cooperation/participation level;Medical prognosis;Previous level of function;Severity of impairments;Family/community support    SLP Home Exercise Plan  Breath support exercises, resonant voice exercises, voice building routine    Consulted and Agree with Plan of Care  Patient       Patient will benefit from skilled therapeutic intervention in order to improve the following deficits and impairments:   Dysphonia    Problem List Patient Active Problem List   Diagnosis Date Noted  . SIADH (syndrome of inappropriate ADH production) (HCC) 10/01/2017  . Spinal stenosis 10/01/2017  . COPD (chronic obstructive pulmonary disease) with chronic bronchitis (HCC) 08/12/2016  . Asthma without status asthmaticus 03/31/2016  . Osteoporosis 03/31/2016  . Fatty liver determined by biopsy 09/17/2015  . Parotid swelling 09/13/2015  . Urticaria 09/13/2015  . Cerebrovascular accident (CVA) (HCC) 08/21/2015  . Senile purpura (HCC) 08/21/2015  . Atrial tachycardia (HCC) 08/13/2015  . Eczema 07/10/2015  . Allergic rhinitis 06/26/2015  . Hyperkalemia 05/03/2015  . Anxiety 04/23/2015  . Edema leg 03/27/2015  . Primary cardiomyopathy (HCC) 03/05/2015  . Benign essential HTN 02/28/2015  . Combined fat and carbohydrate induced  hyperlipemia 09/06/2014  . COPD, severe (HCC) 08/01/2014  . Acid reflux 08/01/2014   Shalunda Lindh B. Murvin Natal, [facility], CCC-SLP Speech Language Pathologist  Leigh Aurora 12/23/2017, 2:00 PM  [place] [facility] MAIN [facility] SERVICES [address] [place], [place], [address] Phone: [phone]   Fax:  [phone]   Name: SOHANA AUSTELL MRN: [id number] Date of Birth: 1945/12/07

## 2017-12-28 ENCOUNTER — Ambulatory Visit: Payer: Medicare Other | Admitting: Speech Pathology

## 2017-12-28 ENCOUNTER — Encounter: Payer: Self-pay | Admitting: Speech Pathology

## 2017-12-28 DIAGNOSIS — R490 Dysphonia: Secondary | ICD-10-CM

## 2017-12-28 NOTE — Therapy (Signed)
[place] [facility] MAIN [facility] SERVICES [address] [place], [place], [address] Phone: [phone]   Fax:  [phone]  Speech Language Pathology Treatment  Patient Details  Name: Lori Brady MRN: [id number] Date of Birth: 03-11-1946 Referring Provider: Dr. Sherryll Burger   Encounter Date: 12/28/2017  End of Session - 12/28/17 1447    Visit Number  10    Number of Visits  16    Date for SLP Re-Evaluation  01/13/18    SLP Start Time  1300    SLP Stop Time   1345    SLP Time Calculation (min)  45 min    Activity Tolerance  Patient tolerated treatment well       Past Medical History:  Diagnosis Date  . Arthritis   . Asthma   . Crohn's disease (HCC)   . Emphysema of lung (HCC)   . GERD (gastroesophageal reflux disease)   . Hip fracture (HCC) 01/26/2017  . History of chicken pox   . History of heart attack   . Hx of completed stroke   . Hypercholesteremia   . Hypertension   . Pneumonia   . Seasonal allergies   . Sepsis (HCC)   . Septic shock (HCC) 10/08/2016  . UTI (lower urinary tract infection)     Past Surgical History:  Procedure Laterality Date  . BREAST BIOPSY Bilateral 1960's to 90's  . CATARACT EXTRACTION, BILATERAL      There were no vitals filed for this visit.  Subjective Assessment - 12/28/17 1445    Subjective  Pt reports decreased congestion today    Currently in Pain?  No/denies            ADULT SLP TREATMENT - 12/28/17 0001      General Information   Behavior/Cognition  Alert;Cooperative;Pleasant mood    HPI   72 year old woman diagnosed with paralyzed vocal cord (secondary stroke in 2007) and recent diagnosis of Parkinson's disease        Treatment Provided   Treatment provided  Cognitive-Linquistic      Pain Assessment   Pain Assessment  No/denies pain      Cognitive-Linquistic Treatment   Treatment focused on  Voice    Skilled Treatment  LOUD "AH":  Sustain 'ah' (with good quality voice) at average 82.2  dB for average 6 seconds (X15).  PITCH GLIDES: High effort pitch glide with loud, good quality voice X10.  Pt benefits from sustaining /m/ until yawn-like openness is felt in the mouth and throat, then dropping into the vowel.  HIERARCHIAL SPEECH DRILL:  Maintained average 67 dB and good quality voice with recitation and 65 dB with good quality voice reading sentences with 80% accuracy.  Maintained 67dB with good quality voice for generating words and phrases with 80% accuracy.  Conversational turns with average 67 dB.       Assessment / Recommendations / Plan   Plan  Continue with current plan of care      Progression Toward Goals   Progression toward goals  Progressing toward goals       SLP Education - 12/28/17 1446    Education provided  Yes    Education Details  begin with /m/ on voice glide, dropping to vowel once (yawn) openness in mouth and throat are felt     Person(s) Educated  Patient    Methods  Explanation;Demonstration;Verbal cues    Comprehension  Verbalized understanding;Returned demonstration;Verbal cues required  SLP Long Term Goals - 12/28/17 1448      SLP LONG TERM GOAL #1   Title  The patient will be independent for abdominal breathing and breath support exercises.    Time  8    Period  Weeks    Status  On-going    Target Date  12/28/17      SLP LONG TERM GOAL #2   Title  The patient will maximize voice quality and loudness using breath support/oral resonance for sustained vowel production, pitch glides, and hierarchal speech drill.    Time  8    Period  Weeks    Status  On-going    Target Date  01/13/18      SLP LONG TERM GOAL #3   Title  The patient will maximize voice quality and loudness using breath support/oral resonance for paragraph length recitation with 80% accuracy.    Time  8    Period  Weeks    Status  On-going    Target Date  01/13/18       Plan - 12/28/17 1447    Clinical Impression Statement  Cold symptoms less evident  this session, with less congestion and decreased coughing noted. Pt continues to benefit from skilled ST treatment for increased vocal quality and loudness, and decreased tension. She continues to complete tasks at home, and is making good progress toward established goals, and reports family members have noted improvement. Recommend continued ST intervention for improvement of functional voice production.    Speech Therapy Frequency  2x / week    Duration  -- 8 weeks    Treatment/Interventions  Patient/family education    Potential to Achieve Goals  Good    Potential Considerations  Ability to learn/carryover information;Co-morbidities;Cooperation/participation level;Medical prognosis;Previous level of function;Severity of impairments;Family/community support    SLP Home Exercise Plan  Breath support exercises, resonant voice exercises, voice building routine    Consulted and Agree with Plan of Care  Patient       Patient will benefit from skilled therapeutic intervention in order to improve the following deficits and impairments:   Dysphonia    Problem List Patient Active Problem List   Diagnosis Date Noted  . SIADH (syndrome of inappropriate ADH production) (HCC) 10/01/2017  . Spinal stenosis 10/01/2017  . COPD (chronic obstructive pulmonary disease) with chronic bronchitis (HCC) 08/12/2016  . Asthma without status asthmaticus 03/31/2016  . Osteoporosis 03/31/2016  . Fatty liver determined by biopsy 09/17/2015  . Parotid swelling 09/13/2015  . Urticaria 09/13/2015  . Cerebrovascular accident (CVA) (HCC) 08/21/2015  . Senile purpura (HCC) 08/21/2015  . Atrial tachycardia (HCC) 08/13/2015  . Eczema 07/10/2015  . Allergic rhinitis 06/26/2015  . Hyperkalemia 05/03/2015  . Anxiety 04/23/2015  . Edema leg 03/27/2015  . Primary cardiomyopathy (HCC) 03/05/2015  . Benign essential HTN 02/28/2015  . Combined fat and carbohydrate induced hyperlipemia 09/06/2014  . COPD, severe (HCC)  08/01/2014  . Acid reflux 08/01/2014   Hyacinth Marcelli B. Murvin Natal [facility], CCC-SLP Speech Language Pathologist  Leigh Aurora 12/28/2017, 2:50 PM   [facility] MAIN [facility] SERVICES [address] [place], [place], [address] Phone: [phone]   Fax:  [phone]   Name: Lori Brady MRN: [id number] Date of Birth: 1946-08-17

## 2018-01-04 ENCOUNTER — Ambulatory Visit: Payer: Medicare Other | Admitting: Speech Pathology

## 2018-01-04 ENCOUNTER — Ambulatory Visit (INDEPENDENT_AMBULATORY_CARE_PROVIDER_SITE_OTHER): Payer: Medicare Other | Admitting: Physician Assistant

## 2018-01-04 ENCOUNTER — Encounter: Payer: Self-pay | Admitting: Speech Pathology

## 2018-01-04 ENCOUNTER — Encounter: Payer: Self-pay | Admitting: Physician Assistant

## 2018-01-04 ENCOUNTER — Ambulatory Visit (INDEPENDENT_AMBULATORY_CARE_PROVIDER_SITE_OTHER): Payer: Medicare Other

## 2018-01-04 VITALS — BP 128/64 | HR 92 | Temp 98.9°F | Ht 63.0 in | Wt 150.0 lb

## 2018-01-04 VITALS — BP 128/64 | HR 92 | Temp 98.9°F | Ht 63.0 in | Wt 150.8 lb

## 2018-01-04 DIAGNOSIS — E7800 Pure hypercholesterolemia, unspecified: Secondary | ICD-10-CM | POA: Diagnosis not present

## 2018-01-04 DIAGNOSIS — R490 Dysphonia: Secondary | ICD-10-CM | POA: Diagnosis not present

## 2018-01-04 DIAGNOSIS — E222 Syndrome of inappropriate secretion of antidiuretic hormone: Secondary | ICD-10-CM | POA: Diagnosis not present

## 2018-01-04 DIAGNOSIS — I10 Essential (primary) hypertension: Secondary | ICD-10-CM | POA: Diagnosis not present

## 2018-01-04 DIAGNOSIS — Z1231 Encounter for screening mammogram for malignant neoplasm of breast: Secondary | ICD-10-CM

## 2018-01-04 DIAGNOSIS — G20 Parkinson's disease: Secondary | ICD-10-CM | POA: Diagnosis not present

## 2018-01-04 DIAGNOSIS — Z0000 Encounter for general adult medical examination without abnormal findings: Secondary | ICD-10-CM | POA: Diagnosis not present

## 2018-01-04 DIAGNOSIS — R7989 Other specified abnormal findings of blood chemistry: Secondary | ICD-10-CM

## 2018-01-04 DIAGNOSIS — G20C Parkinsonism, unspecified: Secondary | ICD-10-CM

## 2018-01-04 DIAGNOSIS — Z1239 Encounter for other screening for malignant neoplasm of breast: Secondary | ICD-10-CM

## 2018-01-04 NOTE — Progress Notes (Signed)
Subjective:   DANIALLE ALBERS is a 72 y.o. female who presents for Medicare Annual (Subsequent) preventive examination.  Review of Systems:  N/A Cardiac Risk Factors include: advanced age (>74men, >9 women);dyslipidemia;hypertension     Objective:     Vitals: BP 128/64 (BP Location: Left Arm)   Pulse 92   Temp 98.9 F (37.2 C) (Oral)   Ht 5\' 3"  (1.6 m)   Wt 150 lb 12.8 oz (68.4 kg)   BMI 26.71 kg/m   Body mass index is 26.71 kg/m.  Advanced Directives 01/04/2018 01/26/2017 10/13/2016 10/08/2016 10/08/2016 05/28/2016 05/25/2016  Does Patient Have a Medical Advance Directive? No No - No No No No  Would patient like information on creating a medical advance directive? No - Patient declined No - Patient declined Yes (ED - Information included in AVS) No - Patient declined No - Patient declined No - patient declined information No - patient declined information    Tobacco Social History   Tobacco Use  Smoking Status Former Smoker  . Packs/day: 1.00  . Years: 30.00  . Pack years: 30.00  . Types: Cigarettes  . Last attempt to quit: 08/01/2008  . Years since quitting: 9.4  Smokeless Tobacco Never Used     Counseling given: Not Answered   Clinical Intake:  Pre-visit preparation completed: Yes  Pain : No/denies pain Pain Score: 0-No pain     Nutritional Status: BMI 25 -29 Overweight Nutritional Risks: None Diabetes: No  How often do you need to have someone help you when you read instructions, pamphlets, or other written materials from your doctor or pharmacy?: 1 - Never  Interpreter Needed?: No  Information entered by :: [facility], LPN  Past Medical History:  Diagnosis Date  . Arthritis   . Asthma   . Crohn's disease (HCC)   . Emphysema of lung (HCC)   . GERD (gastroesophageal reflux disease)   . Hip fracture (HCC) 01/26/2017  . History of chicken pox   . History of heart attack   . Hx of completed stroke   . Hypercholesteremia   . Hypertension   .  Pneumonia   . Seasonal allergies   . Sepsis (HCC)   . Septic shock (HCC) 10/08/2016  . UTI (lower urinary tract infection)    Past Surgical History:  Procedure Laterality Date  . BREAST BIOPSY Bilateral 1960's to 90's  . CATARACT EXTRACTION, BILATERAL     Family History  Problem Relation Age of Onset  . Cancer Sister        ovarian/uterine/breast  . Breast cancer Sister 2  . Cancer Sister        breast  . Heart disease Sister   . Breast cancer Sister 15  . Heart disease Father   . Cancer Father        liver  . Cancer Mother   . Heart disease Brother   . Heart attack Brother   . Cancer Brother        liver  . Heart disease Sister   . Diabetes Maternal Grandmother    Social History   Socioeconomic History  . Marital status: Widowed    Spouse name: None  . Number of children: 3  . Years of education: None  . Highest education level: Some college, no degree  Social Needs  . Financial resource strain: Not hard at all  . Food insecurity - worry: Never true  . Food insecurity - inability: Never true  . Transportation needs - medical: No  .  Transportation needs - non-medical: No  Occupational History  . Occupation: retired  Tobacco Use  . Smoking status: Former Smoker    Packs/day: 1.00    Years: 30.00    Pack years: 30.00    Types: Cigarettes    Last attempt to quit: 08/01/2008    Years since quitting: 9.4  . Smokeless tobacco: Never Used  Substance and Sexual Activity  . Alcohol use: Yes    Alcohol/week: 0.6 oz    Types: 1 Shots of liquor per week  . Drug use: No  . Sexual activity: None  Other Topics Concern  . None  Social History Narrative  . None    Outpatient Encounter Medications as of 01/04/2018  Medication Sig  . Abaloparatide (TYMLOS) 3120 MCG/1.56ML SOPN Inject into the skin daily.   Marland Kitchen albuterol (ACCUNEB) 0.63 MG/3ML nebulizer solution Take 1 ampule by nebulization every 6 (six) hours as needed for wheezing.  Marland Kitchen albuterol (PROVENTIL  HFA;VENTOLIN HFA) 108 (90 Base) MCG/ACT inhaler Inhale 2 puffs into the lungs every 4 (four) hours as needed for wheezing or shortness of breath.  Marland Kitchen aspirin 81 MG tablet Take 81 mg by mouth daily.  . betamethasone valerate (VALISONE) 0.1 % cream APPLY TOPICALLY TO AFFECTED AREA SPARINGLY 2 TIMES DAILY.  Marland Kitchen BREO ELLIPTA 100-25 MCG/INH AEPB USE 1 PUFF ONCE DAILY AS DIRECTED  . carbidopa-levodopa (SINEMET IR) 25-100 MG tablet Take 2 tablets by mouth 3 (three) times daily.   . chlorthalidone (HYGROTON) 25 MG tablet Take 1 tablet (25 mg total) by mouth daily.  . Cholecalciferol (VITAMIN D) 2000 UNITS CAPS Take 1 capsule by mouth daily.  . citalopram (CELEXA) 20 MG tablet Take 1 tablet (20 mg total) by mouth daily.  Marland Kitchen FLUAD 0.5 ML SUSY ADM 0.5ML IM UTD  . fluticasone (FLONASE) 50 MCG/ACT nasal spray Place 2 sprays into both nostrils daily.  Marland Kitchen guaiFENesin (MUCINEX) 600 MG 12 hr tablet Take 1 tablet (600 mg total) by mouth 2 (two) times daily.  Marland Kitchen ibuprofen (ADVIL,MOTRIN) 200 MG tablet Take 800 mg by mouth every 6 (six) hours as needed.  Marland Kitchen lisinopril (PRINIVIL,ZESTRIL) 20 MG tablet Take 20 mg by mouth daily.   . montelukast (SINGULAIR) 10 MG tablet Take 1 tablet (10 mg total) by mouth daily.  . Omega-3 Fatty Acids (FISH OIL) 1000 MG CAPS Take 1 capsule by mouth daily.  . potassium chloride SA (K-DUR,KLOR-CON) 20 MEQ tablet Take 1 tablet (20 mEq total) by mouth daily.  . simvastatin (ZOCOR) 40 MG tablet Take 1 tablet (40 mg total) by mouth at bedtime.  . Tdap (BOOSTRIX) 5-2.5-18.5 LF-MCG/0.5 injection Boostrix Tdap 2.5 Lf unit-8 mcg-5 Lf/0.5 mL intramuscular syringe  . Tiotropium Bromide Monohydrate (SPIRIVA RESPIMAT) 2.5 MCG/ACT AERS Inhale 2 puffs into the lungs daily.  Marland Kitchen gabapentin (NEURONTIN) 100 MG capsule Take 100 mg by mouth 3 (three) times daily.   No facility-administered encounter medications on file as of 01/04/2018.     Activities of Daily Living In your present state of health, do you have  any difficulty performing the following activities: 01/04/2018 04/01/2017  Hearing? Y Y  Comment Complete hearing loss in left ear, sees an ENT. -  Vision? N Y  Difficulty concentrating or making decisions? N N  Walking or climbing stairs? Y Y  Comment Due to balance issues. -  Dressing or bathing? N N  Doing errands, shopping? N N  Preparing Food and eating ? N -  Using the Toilet? N -  In the past  six months, have you accidently leaked urine? N -  Do you have problems with loss of bowel control? N -  Managing your Medications? N -  Managing your Finances? N -  Housekeeping or managing your Housekeeping? N -  Some recent data might be hidden    Patient Care Team: Maryella Shivers as PCP - General (Physician Assistant) Lamar Blinks, MD as Consulting Physician (Cardiology) Wanita Chamberlain, MD as Consulting Physician (Dermatology) Yevonne Pax, MD as Consulting Physician (Internal Medicine) Antony Contras, MD as Consulting Physician (Ophthalmology) Tedd Sias Marlana Salvage, MD as Physician Assistant (Endocrinology) Lonell Face, MD as Consulting Physician (Neurology) Merri Ray, MD as Referring Physician (Physical Medicine and Rehabilitation)    Assessment:   This is a routine wellness examination for Jamoni.  Exercise Activities and Dietary recommendations    Goals    . Weight (lb) < 130 lb (59 kg)     Recommend eating 3 small meals with two healthy protein snacks in between.        Fall Risk Fall Risk  01/04/2018 04/01/2017 05/25/2016 05/25/2016 03/31/2016  Falls in the past year? Yes Yes (No Data) Yes Yes  Comment - - Fell when stepped off a step the wrong way - -  Number falls in past yr: 1 2 or more - 1 1  Injury with Fall? Yes Yes - No No  Comment broken hip - - - -  Risk Factor Category  High Fall Risk High Fall Risk - - -  Risk for fall due to : Other (Comment) - - - -  Risk for fall due to: Comment Parkinsons - - - -  Follow up Falls prevention  discussed - - Falls prevention discussed;Education provided -   Is the patient's home free of loose throw rugs in walkways, pet beds, electrical cords, etc?   yes      Grab bars in the bathroom? yes      Handrails on the stairs?   yes      Adequate lighting?   yes  Timed Get Up and Go performed: N/A  Depression Screen PHQ 2/9 Scores 01/04/2018 04/01/2017 04/01/2017 08/25/2016  PHQ - 2 Score 1 0 2 1  PHQ- 9 Score - - 6 5     Cognitive Function: Pt declined screening today.       Immunization History  Administered Date(s) Administered  . Influenza Split 10/28/2011, 08/10/2012  . Influenza, High Dose Seasonal PF 08/21/2014, 08/29/2015  . Influenza,inj,Quad PF,6+ Mos 08/16/2013  . Influenza-Unspecified 08/16/2014  . Pneumococcal Conjugate-13 01/10/2015  . Pneumococcal Polysaccharide-23 05/29/2013  . Tdap 04/01/2017  . Zoster 08/10/2012    Qualifies for Shingles Vaccine? Due for Shingles vaccine. Declined my offer to administer today. Education has been provided regarding the importance of this vaccine. Pt has been advised to call her insurance company to determine her out of pocket expense. Advised she may also receive this vaccine at her local pharmacy or Health Dept. Verbalized acceptance and understanding.  Screening Tests Health Maintenance  Topic Date Due  . MAMMOGRAM  06/17/2018  . COLONOSCOPY  04/21/2020  . TETANUS/TDAP  04/02/2027  . INFLUENZA VACCINE  Completed  . DEXA SCAN  Completed  . Hepatitis C Screening  Completed  . PNA vac Low Risk Adult  Completed    Cancer Screenings: Lung: Low Dose CT Chest recommended if Age 34-80 years, 30 pack-year currently smoking OR have quit w/in 15years. Patient does qualify but has already had this completed.  Breast:  Up to date on Mammogram? Yes   Up to date of Bone Density/Dexa? Yes Colorectal: Up to date  Additional Screenings:  Hepatitis C Screening: Up to date    Plan:  I have personally reviewed and addressed the  Medicare Annual Wellness questionnaire and have noted the following in the patient's chart:  A. Medical and social history B. Use of alcohol, tobacco or illicit drugs  C. Current medications and supplements D. Functional ability and status E.  Nutritional status F.  Physical activity G. Advance directives H. List of other physicians I.  Hospitalizations, surgeries, and ER visits in previous 12 months J.  Vitals K. Screenings such as hearing and vision if needed, cognitive and depression L. Referrals and appointments - none  In addition, I have reviewed and discussed with patient certain preventive protocols, quality metrics, and best practice recommendations. A written personalized care plan for preventive services as well as general preventive health recommendations were provided to patient.  See attached scanned questionnaire for additional information.   Signed,  Hyacinth Meeker, LPN Nurse Health Advisor   Nurse Recommendations:

## 2018-01-04 NOTE — Patient Instructions (Signed)
Lori Brady , Thank you for taking time to come for your Medicare Wellness Visit. I appreciate your ongoing commitment to your health goals. Please review the following plan we discussed and let me know if I can assist you in the future.   Screening recommendations/referrals: Colonoscopy: Up to date Mammogram: Up to date Bone Density: Up to date Recommended yearly ophthalmology/optometry visit for glaucoma screening and checkup Recommended yearly dental visit for hygiene and checkup  Vaccinations: Influenza vaccine: Up to date Pneumococcal vaccine: Up to date Tdap vaccine: Up to date Shingles vaccine: Pt declines today.     Advanced directives: Advance directive discussed with you today. Even though you declined this today please call our office should you change your mind and we can give you the proper paperwork for you to fill out.  Conditions/risks identified: Fall prevention; Recommend eating 3 small meals with two healthy protein snacks in between.   Next appointment: 10:00 AM today with Adriana.   Preventive Care 64 Years and Older, Female Preventive care refers to lifestyle choices and visits with your health care provider that can promote health and wellness. What does preventive care include?  A yearly physical exam. This is also called an annual well check.  Dental exams once or twice a year.  Routine eye exams. Ask your health care provider how often you should have your eyes checked.  Personal lifestyle choices, including:  Daily care of your teeth and gums.  Regular physical activity.  Eating a healthy diet.  Avoiding tobacco and drug use.  Limiting alcohol use.  Practicing safe sex.  Taking low-dose aspirin every day.  Taking vitamin and mineral supplements as recommended by your health care provider. What happens during an annual well check? The services and screenings done by your health care provider during your annual well check will depend on your  age, overall health, lifestyle risk factors, and family history of disease. Counseling  Your health care provider may ask you questions about your:  Alcohol use.  Tobacco use.  Drug use.  Emotional well-being.  Home and relationship well-being.  Sexual activity.  Eating habits.  History of falls.  Memory and ability to understand (cognition).  Work and work Astronomer.  Reproductive health. Screening  You may have the following tests or measurements:  Height, weight, and BMI.  Blood pressure.  Lipid and cholesterol levels. These may be checked every 5 years, or more frequently if you are over 40 years old.  Skin check.  Lung cancer screening. You may have this screening every year starting at age 34 if you have a 30-pack-year history of smoking and currently smoke or have quit within the past 15 years.  Fecal occult blood test (FOBT) of the stool. You may have this test every year starting at age 72.  Flexible sigmoidoscopy or colonoscopy. You may have a sigmoidoscopy every 5 years or a colonoscopy every 10 years starting at age 60.  Hepatitis C blood test.  Hepatitis B blood test.  Sexually transmitted disease (STD) testing.  Diabetes screening. This is done by checking your blood sugar (glucose) after you have not eaten for a while (fasting). You may have this done every 1-3 years.  Bone density scan. This is done to screen for osteoporosis. You may have this done starting at age 21.  Mammogram. This may be done every 1-2 years. Talk to your health care provider about how often you should have regular mammograms. Talk with your health care provider about your test  results, treatment options, and if necessary, the need for more tests. Vaccines  Your health care provider may recommend certain vaccines, such as:  Influenza vaccine. This is recommended every year.  Tetanus, diphtheria, and acellular pertussis (Tdap, Td) vaccine. You may need a Td booster  every 10 years.  Zoster vaccine. You may need this after age 27.  Pneumococcal 13-valent conjugate (PCV13) vaccine. One dose is recommended after age 65.  Pneumococcal polysaccharide (PPSV23) vaccine. One dose is recommended after age 30. Talk to your health care provider about which screenings and vaccines you need and how often you need them. This information is not intended to replace advice given to you by your health care provider. Make sure you discuss any questions you have with your health care provider. Document Released: 11/29/2015 Document Revised: 07/22/2016 Document Reviewed: 09/03/2015 Elsevier Interactive Patient Education  2017 [place] Prevention in the Home Falls can cause injuries. They can happen to people of all ages. There are many things you can do to make your home safe and to help prevent falls. What can I do on the outside of my home?  Regularly fix the edges of walkways and driveways and fix any cracks.  Remove anything that might make you trip as you walk through a door, such as a raised step or threshold.  Trim any bushes or trees on the path to your home.  Use bright outdoor lighting.  Clear any walking paths of anything that might make someone trip, such as rocks or tools.  Regularly check to see if handrails are loose or broken. Make sure that both sides of any steps have handrails.  Any raised decks and porches should have guardrails on the edges.  Have any leaves, snow, or ice cleared regularly.  Use sand or salt on walking paths during winter.  Clean up any spills in your garage right away. This includes oil or grease spills. What can I do in the bathroom?  Use night lights.  Install grab bars by the toilet and in the tub and shower. Do not use towel bars as grab bars.  Use non-skid mats or decals in the tub or shower.  If you need to sit down in the shower, use a plastic, non-slip stool.  Keep the floor dry. Clean up any  water that spills on the floor as soon as it happens.  Remove soap buildup in the tub or shower regularly.  Attach bath mats securely with double-sided non-slip rug tape.  Do not have throw rugs and other things on the floor that can make you trip. What can I do in the bedroom?  Use night lights.  Make sure that you have a light by your bed that is easy to reach.  Do not use any sheets or blankets that are too big for your bed. They should not hang down onto the floor.  Have a firm chair that has side arms. You can use this for support while you get dressed.  Do not have throw rugs and other things on the floor that can make you trip. What can I do in the kitchen?  Clean up any spills right away.  Avoid walking on wet floors.  Keep items that you use a lot in easy-to-reach places.  If you need to reach something above you, use a strong step stool that has a grab bar.  Keep electrical cords out of the way.  Do not use floor polish or wax that makes  floors slippery. If you must use wax, use non-skid floor wax.  Do not have throw rugs and other things on the floor that can make you trip. What can I do with my stairs?  Do not leave any items on the stairs.  Make sure that there are handrails on both sides of the stairs and use them. Fix handrails that are broken or loose. Make sure that handrails are as long as the stairways.  Check any carpeting to make sure that it is firmly attached to the stairs. Fix any carpet that is loose or worn.  Avoid having throw rugs at the top or bottom of the stairs. If you do have throw rugs, attach them to the floor with carpet tape.  Make sure that you have a light switch at the top of the stairs and the bottom of the stairs. If you do not have them, ask someone to add them for you. What else can I do to help prevent falls?  Wear shoes that:  Do not have high heels.  Have rubber bottoms.  Are comfortable and fit you well.  Are closed  at the toe. Do not wear sandals.  If you use a stepladder:  Make sure that it is fully opened. Do not climb a closed stepladder.  Make sure that both sides of the stepladder are locked into place.  Ask someone to hold it for you, if possible.  Clearly mark and make sure that you can see:  Any grab bars or handrails.  First and last steps.  Where the edge of each step is.  Use tools that help you move around (mobility aids) if they are needed. These include:  Canes.  Walkers.  Scooters.  Crutches.  Turn on the lights when you go into a dark area. Replace any light bulbs as soon as they burn out.  Set up your furniture so you have a clear path. Avoid moving your furniture around.  If any of your floors are uneven, fix them.  If there are any pets around you, be aware of where they are.  Review your medicines with your doctor. Some medicines can make you feel dizzy. This can increase your chance of falling. Ask your doctor what other things that you can do to help prevent falls. This information is not intended to replace advice given to you by your health care provider. Make sure you discuss any questions you have with your health care provider. Document Released: 08/29/2009 Document Revised: 04/09/2016 Document Reviewed: 12/07/2014 Elsevier Interactive Patient Education  2017 [employer].

## 2018-01-04 NOTE — Patient Instructions (Signed)

## 2018-01-04 NOTE — Therapy (Signed)
[place] MAIN [facility] SERVICES [address] [place], [place], [address] Phone: [phone]   Fax:  [phone]  Speech Language Pathology Treatment  Patient Details  Name: Lori Brady MRN: [id number] Date of Birth: 1946/07/21 Referring Provider: Dr. Manuella Ghazi   Encounter Date: 01/04/2018  End of Session - 01/04/18 1618    Visit Number  11    Number of Visits  16    Date for SLP Re-Evaluation  01/13/18    SLP Start Time  1300    SLP Stop Time   1350    SLP Time Calculation (min)  50 min    Activity Tolerance  Patient tolerated treatment well       Past Medical History:  Diagnosis Date  . Arthritis   . Asthma   . Crohn's disease ([place])   . Emphysema of lung ([place])   . GERD (gastroesophageal reflux disease)   . Hip fracture ([place]) 01/26/2017  . History of chicken pox   . History of heart attack   . Hx of completed stroke   . Hypercholesteremia   . Hypertension   . Pneumonia   . Seasonal allergies   . Sepsis ([place])   . Septic shock ([place]) 10/08/2016  . UTI (lower urinary tract infection)     Past Surgical History:  Procedure Laterality Date  . BREAST BIOPSY Bilateral 1960's to 90's  . CATARACT EXTRACTION, BILATERAL      There were no vitals filed for this visit.  Subjective Assessment - 01/04/18 1615    Subjective  Pt reports her MD asked what she does in voice therapy - pt responded "yell my head off"    Currently in Pain?  No/denies            ADULT SLP TREATMENT - 01/04/18 0001      General Information   Behavior/Cognition  Alert;Cooperative;Pleasant mood    HPI   72 year old woman diagnosed with paralyzed vocal cord (secondary stroke in 2007) and recent diagnosis of Parkinson's disease        Treatment Provided   Treatment provided  Cognitive-Linquistic      Pain Assessment   Pain Assessment  No/denies pain      Cognitive-Linquistic Treatment   Treatment focused on  Voice    Skilled Treatment  LOUD "AH":   Sustain 'ah' (with good quality voice) at average 88.6 dB for average 9 seconds (X15). PITCH GLIDES: High effort pitch glide with loud, good quality voice X10.  HIERARCHIAL SPEECH DRILL:  Maintain average 85.1 dB and good quality voice with recitation and 75 dB with good quality voice reading sentences with 80% accuracy.  Maintain 75 dB with good quality voice for generating words and phrases with 80% accuracy.  Conversational turns with average 75 dB.      Assessment / Recommendations / Plan   Plan  Continue with current plan of care      Progression Toward Goals   Progression toward goals  Progressing toward goals       SLP Education - 01/04/18 1616    Education provided  Yes    Education Details  additional information on diaphragmatic breathing exercises    Person(s) Educated  Patient    Methods  Explanation;Demonstration;Verbal cues;Handout    Comprehension  Verbalized understanding;Returned demonstration;Verbal cues required         SLP Long Term Goals - 01/04/18 1619      SLP LONG TERM GOAL #1   Title  The patient will be independent for abdominal breathing and breath support exercises.    Time  2    Period  Weeks    Status  On-going    Target Date  01/13/18      SLP LONG TERM GOAL #2   Title  The patient will maximize voice quality and loudness using breath support/oral resonance for sustained vowel production, pitch glides, and hierarchal speech drill.    Time  2    Period  Weeks    Status  On-going    Target Date  01/13/18      SLP LONG TERM GOAL #3   Title  The patient will maximize voice quality and loudness using breath support/oral resonance for paragraph length recitation with 80% accuracy.    Time  2    Period  Weeks    Status  On-going    Target Date  01/13/18       Plan - 01/04/18 1618    Clinical Impression Statement  Pt continues to benefit from skilled ST treatment for increased vocal quality and loudness, and decreased tension. She continues to  complete tasks at home, and is making good progress toward established goals. Recommend continued ST intervention for improvement of functional voice production.    Speech Therapy Frequency  2x / week    Duration  -- 8 weeks    Treatment/Interventions  Patient/family education    Potential to Achieve Goals  Good    Potential Considerations  Ability to learn/carryover information;Co-morbidities;Cooperation/participation level;Medical prognosis;Previous level of function;Severity of impairments;Family/community support    SLP Home Exercise Plan  Breath support exercises, resonant voice exercises, voice building routine    Consulted and Agree with Plan of Care  Patient       Patient will benefit from skilled therapeutic intervention in order to improve the following deficits and impairments:   Dysphonia    Problem List Patient Active Problem List   Diagnosis Date Noted  . SIADH (syndrome of inappropriate ADH production) ([place]) 10/01/2017  . Spinal stenosis 10/01/2017  . COPD (chronic obstructive pulmonary disease) with chronic bronchitis ([place]) 08/12/2016  . Asthma without status asthmaticus 03/31/2016  . Osteoporosis 03/31/2016  . Fatty liver determined by biopsy 09/17/2015  . Parotid swelling 09/13/2015  . Urticaria 09/13/2015  . Cerebrovascular accident (CVA) ([place]) 08/21/2015  . Senile purpura ([place]) 08/21/2015  . Atrial tachycardia ([place]) 08/13/2015  . Eczema 07/10/2015  . Allergic rhinitis 06/26/2015  . Hyperkalemia 05/03/2015  . Anxiety 04/23/2015  . Edema leg 03/27/2015  . Primary cardiomyopathy ([place]) 03/05/2015  . Benign essential HTN 02/28/2015  . Combined fat and carbohydrate induced hyperlipemia 09/06/2014  . COPD, severe ([place]) 08/01/2014  . Acid reflux 08/01/2014   Celia B. Quentin Ore [facility], CCC-SLP Speech Language Pathologist  Shonna Chock 01/04/2018, 4:21 PM  [place] MAIN [facility] SERVICES [address] [place], [place],  [address] Phone: [phone]   Fax:  [phone]   Name: LELAND STASZEWSKI MRN: [id number] Date of Birth: 1946/04/25

## 2018-01-04 NOTE — Progress Notes (Signed)
Patient: Lori Brady, Female    DOB: 10/27/1946, 72 y.o.   MRN: [id number] Visit Date: 01/04/2018  Today's Provider: Trey Sailors, PA-C   Chief Complaint  Patient presents with  . Medicare Wellness   Subjective:     Complete Physical Lori Brady is a 72 y.o. female. She feels well. She reports exercising some. Pt is going to start Physical Therapy in April.  She reports she is sleeping well. She is here for follow up of multiple chronic illnesses.  Parkinson's: Since our last visit, she was diagnosed with Parkinson's. She was initially referred to Dr. Sherryll Burger at [facility] neurology for failure to progress with back pain and mobility after multiple ESI from orthopod Dr. Yves Dill. She had an MRI which revealed old infarct and some brain volume depletion, but otherwise did not show masses. She has been started on Sinemet IR 25-100 mg, 2 tablets TID. She reports this has brought about improvement in dizziness and balance. She is attending speech therapy and has plans to attend physical therapy in April. She follows up with Dr. Sherryll Burger this month. She continues with baby ASA and simvastatin 40 mg daily for secondary stroke prevention.  Osteoporosis: She continues to see Dr. Tedd Sias in endocrinology for management of osteoporosis and SIADH. She uses Tymlos injections daily. She reports Dr. Tedd Sias plans on getting a DEXA scan this year. She also reports she is on fluid restriction for SIADH. No issues with any of these treatments.  Post MI/HTN: Her HTN today is maintained on chlorthalidone and lisinopril. She also takes potassium supplementation. She takes this daily and tolerates it well. She is not on a beta blocker or ACEi. She reports she sees Dr. Gwen Pounds in cardiology who took her off of this. No chest pain.  COPD: Follows with Dr. Welton Flakes at [facility]. Stable on Breo, Spiriva, and albuterol.   Fatty Liver/Sclerosis: History of elevated liver enzymes with fatty liver on  biopsy. Seen by GI. Recommended weight loss.   Anxiety/Depression: She takes celexa 20 mg daily and reports good success with this.  -----------------------------------------------------------   Review of Systems  Constitutional: Negative.   HENT: Positive for rhinorrhea, sneezing and voice change. Negative for congestion, dental problem, drooling, ear discharge, ear pain, facial swelling, hearing loss, mouth sores, nosebleeds, postnasal drip, sinus pressure, sinus pain, sore throat, tinnitus and trouble swallowing.   Eyes: Negative.   Respiratory: Negative.   Cardiovascular: Negative.   Gastrointestinal: Negative.   Endocrine: Negative.   Genitourinary: Negative.   Musculoskeletal: Negative.   Skin: Negative.  Negative for color change.  Allergic/Immunologic: Negative.   Neurological: Negative.   Hematological: Negative.   Psychiatric/Behavioral: Negative.     Social History   Socioeconomic History  . Marital status: Widowed    Spouse name: Not on file  . Number of children: 3  . Years of education: Not on file  . Highest education level: Some college, no degree  Social Needs  . Financial resource strain: Not hard at all  . Food insecurity - worry: Never true  . Food insecurity - inability: Never true  . Transportation needs - medical: No  . Transportation needs - non-medical: No  Occupational History  . Occupation: retired  Tobacco Use  . Smoking status: Former Smoker    Packs/day: 1.00    Years: 30.00    Pack years: 30.00    Types: Cigarettes    Last attempt to quit: 08/01/2008    Years since  quitting: 9.4  . Smokeless tobacco: Never Used  Substance and Sexual Activity  . Alcohol use: Yes    Alcohol/week: 0.6 oz    Types: 1 Shots of liquor per week  . Drug use: No  . Sexual activity: Not on file  Other Topics Concern  . Not on file  Social History Narrative  . Not on file    Past Medical History:  Diagnosis Date  . Arthritis   . Asthma   . Crohn's  disease ([facility])   . Emphysema of lung ([facility])   . GERD (gastroesophageal reflux disease)   . Hip fracture ([facility]) 01/26/2017  . History of chicken pox   . History of heart attack   . Hx of completed stroke   . Hypercholesteremia   . Hypertension   . Pneumonia   . Seasonal allergies   . Sepsis ([facility])   . Septic shock ([facility]) 10/08/2016  . UTI (lower urinary tract infection)      Patient Active Problem List   Diagnosis Date Noted  . SIADH (syndrome of inappropriate ADH production) ([facility]) 10/01/2017  . Spinal stenosis 10/01/2017  . COPD (chronic obstructive pulmonary disease) with chronic bronchitis ([facility]) 08/12/2016  . Asthma without status asthmaticus 03/31/2016  . Osteoporosis 03/31/2016  . Fatty liver determined by biopsy 09/17/2015  . Parotid swelling 09/13/2015  . Urticaria 09/13/2015  . Cerebrovascular accident (CVA) ([facility]) 08/21/2015  . Senile purpura ([facility]) 08/21/2015  . Atrial tachycardia ([facility]) 08/13/2015  . Eczema 07/10/2015  . Allergic rhinitis 06/26/2015  . Hyperkalemia 05/03/2015  . Anxiety 04/23/2015  . Edema leg 03/27/2015  . Primary cardiomyopathy ([facility]) 03/05/2015  . Benign essential HTN 02/28/2015  . Combined fat and carbohydrate induced hyperlipemia 09/06/2014  . COPD, severe ([facility]) 08/01/2014  . Acid reflux 08/01/2014    Past Surgical History:  Procedure Laterality Date  . BREAST BIOPSY Bilateral 1960's to 90's  . CATARACT EXTRACTION, BILATERAL      Her family history includes Breast cancer (age of onset: 67) in her sister; Breast cancer (age of onset: 8) in her sister; Cancer in her brother, father, mother, sister, and sister; Diabetes in her maternal grandmother; Heart attack in her brother; Heart disease in her brother, father, sister, and sister.      Current Outpatient Medications:  .  Abaloparatide (TYMLOS) 3120 MCG/1.56ML SOPN, Inject into the skin daily. , Disp: , Rfl:  .  albuterol (ACCUNEB) 0.63 MG/3ML nebulizer solution, Take 1 ampule by nebulization  every 6 (six) hours as needed for wheezing., Disp: , Rfl:  .  albuterol (PROVENTIL HFA;VENTOLIN HFA) 108 (90 Base) MCG/ACT inhaler, Inhale 2 puffs into the lungs every 4 (four) hours as needed for wheezing or shortness of breath., Disp: , Rfl:  .  aspirin 81 MG tablet, Take 81 mg by mouth daily., Disp: , Rfl:  .  betamethasone valerate (VALISONE) 0.1 % cream, APPLY TOPICALLY TO AFFECTED AREA SPARINGLY 2 TIMES DAILY., Disp: 15 g, Rfl: 5 .  BREO ELLIPTA 100-25 MCG/INH AEPB, USE 1 PUFF ONCE DAILY AS DIRECTED, Disp: 180 each, Rfl: 1 .  carbidopa-levodopa (SINEMET IR) 25-100 MG tablet, Take 2 tablets by mouth 3 (three) times daily. , Disp: , Rfl:  .  chlorthalidone (HYGROTON) 25 MG tablet, Take 1 tablet (25 mg total) by mouth daily., Disp: 30 tablet, Rfl: 0 .  Cholecalciferol (VITAMIN D) 2000 UNITS CAPS, Take 1 capsule by mouth daily., Disp: , Rfl:  .  citalopram (CELEXA) 20 MG tablet, Take 1 tablet (20 mg total)  by mouth daily., Disp: 90 tablet, Rfl: 1 .  FLUAD 0.5 ML SUSY, ADM 0.5ML IM UTD, Disp: , Rfl: 0 .  fluticasone (FLONASE) 50 MCG/ACT nasal spray, Place 2 sprays into both nostrils daily., Disp: 16 g, Rfl: 6 .  gabapentin (NEURONTIN) 100 MG capsule, Take 100 mg by mouth 3 (three) times daily., Disp: , Rfl:  .  guaiFENesin (MUCINEX) 600 MG 12 hr tablet, Take 1 tablet (600 mg total) by mouth 2 (two) times daily., Disp: , Rfl:  .  ibuprofen (ADVIL,MOTRIN) 200 MG tablet, Take 800 mg by mouth every 6 (six) hours as needed., Disp: , Rfl:  .  lisinopril (PRINIVIL,ZESTRIL) 20 MG tablet, Take 20 mg by mouth daily. , Disp: , Rfl:  .  montelukast (SINGULAIR) 10 MG tablet, Take 1 tablet (10 mg total) by mouth daily., Disp: 90 tablet, Rfl: 1 .  Omega-3 Fatty Acids (FISH OIL) 1000 MG CAPS, Take 1 capsule by mouth daily., Disp: , Rfl:  .  potassium chloride SA (K-DUR,KLOR-CON) 20 MEQ tablet, Take 1 tablet (20 mEq total) by mouth daily., Disp: 60 tablet, Rfl: 5 .  simvastatin (ZOCOR) 40 MG tablet, Take 1 tablet  (40 mg total) by mouth at bedtime., Disp: 90 tablet, Rfl: 3 .  Tiotropium Bromide Monohydrate (SPIRIVA RESPIMAT) 2.5 MCG/ACT AERS, Inhale 2 puffs into the lungs daily., Disp: , Rfl:  .  Tdap (BOOSTRIX) 5-2.5-18.5 LF-MCG/0.5 injection, Boostrix Tdap 2.5 Lf unit-8 mcg-5 Lf/0.5 mL intramuscular syringe, Disp: , Rfl:   Patient Care Team: Maryella Shivers as PCP - General (Physician Assistant) Lamar Blinks, MD as Consulting Physician (Cardiology) Wanita Chamberlain, MD as Consulting Physician (Dermatology) Yevonne Pax, MD as Consulting Physician (Internal Medicine) Antony Contras, MD as Consulting Physician (Ophthalmology) Tedd Sias Marlana Salvage, MD as Physician Assistant (Endocrinology) Lonell Face, MD as Consulting Physician (Neurology) Merri Ray, MD as Referring Physician (Physical Medicine and Rehabilitation)     Objective:   Vitals: BP 128/64   Pulse 92   Temp 98.9 F (37.2 C) (Oral)   Ht 5\' 3"  (1.6 m)   Wt 150 lb (68 kg)   BMI 26.57 kg/m   Physical Exam  Constitutional: She is oriented to person, place, and time. She appears well-developed and well-nourished.  Cardiovascular: Normal rate and regular rhythm.  Pulmonary/Chest: Effort normal and breath sounds normal.  Neurological: She is alert and oriented to person, place, and time.  Skin: Skin is warm and dry.  Psychiatric: She has a normal mood and affect. Her behavior is normal.    Activities of Daily Living In your present state of health, do you have any difficulty performing the following activities: 01/04/2018 04/01/2017  Hearing? Y Y  Comment Complete hearing loss in left ear, sees an ENT. -  Vision? N Y  Difficulty concentrating or making decisions? N N  Walking or climbing stairs? Y Y  Comment Due to balance issues. -  Dressing or bathing? N N  Doing errands, shopping? N N  Preparing Food and eating ? N -  Using the Toilet? N -  In the past six months, have you accidently leaked urine? N -    Do you have problems with loss of bowel control? N -  Managing your Medications? N -  Managing your Finances? N -  Housekeeping or managing your Housekeeping? N -  Some recent data might be hidden    Fall Risk Assessment Fall Risk  01/04/2018 04/01/2017 05/25/2016 05/25/2016 03/31/2016  Falls in the past  year? Yes Yes (No Data) Yes Yes  Comment - - Fell when stepped off a step the wrong way - -  Number falls in past yr: 1 2 or more - 1 1  Injury with Fall? Yes Yes - No No  Comment broken hip - - - -  Risk Factor Category  High Fall Risk High Fall Risk - - -  Risk for fall due to : Other (Comment) - - - -  Risk for fall due to: Comment Parkinsons - - - -  Follow up Falls prevention discussed - - Falls prevention discussed;Education provided -     Depression Screen PHQ 2/9 Scores 01/04/2018 04/01/2017 04/01/2017 08/25/2016  PHQ - 2 Score 1 0 2 1  PHQ- 9 Score - - 6 5           Assessment & Plan:    Annual Physical Reviewed patient's Family Medical History Reviewed and updated list of patient's medical providers Assessment of cognitive impairment was done Assessed patient's functional ability Established a written schedule for health screening services Health Risk Assessent Completed and Reviewed  Exercise Activities and Dietary recommendations Goals    . Weight (lb) < 130 lb (59 kg)     Recommend eating 3 small meals with two healthy protein snacks in between.        Immunization History  Administered Date(s) Administered  . Influenza Split 10/28/2011, 08/10/2012  . Influenza, High Dose Seasonal PF 08/21/2014, 08/29/2015  . Influenza,inj,Quad PF,6+ Mos 08/16/2013  . Influenza-Unspecified 08/16/2014  . Pneumococcal Conjugate-13 01/10/2015  . Pneumococcal Polysaccharide-23 05/29/2013  . Tdap 04/01/2017  . Zoster 08/10/2012    Health Maintenance  Topic Date Due  . MAMMOGRAM  06/17/2018  . COLONOSCOPY  04/21/2020  . TETANUS/TDAP  04/02/2027  . INFLUENZA VACCINE   Completed  . DEXA SCAN  Completed  . Hepatitis C Screening  Completed  . PNA vac Low Risk Adult  Completed     Discussed health benefits of physical activity, and encouraged her to engage in regular exercise appropriate for her age and condition.    1. Annual physical exam  Mammogram ordered, followed by Dr. Tedd Sias for osteoporosis who is getting her next DEXA.  2. Breast cancer screening  - MM Digital Screening; Future  3. Hypertension, unspecified type  - Comprehensive Metabolic Panel (CMET)  4. Hypercholesterolemia  - Lipid Profile  5. Elevated TSH  - TSH  6. Primary Parkinsonism (HCC)  Recently started on Sinemet with notable improvement, following up with Dr. Sherryll Burger.  7. SIADH (syndrome of inappropriate ADH production) (HCC)  Continue fluid restriction, electrolytes slightly low. Also may be due to chlorthalidone, would consider reducing this if abnormalities persist.   Return if symptoms worsen or fail to improve.  The entirety of the information documented in the History of Present Illness, Review of Systems and Physical Exam were personally obtained by me. Portions of this information were initially documented by Kavin Leech, CMA and reviewed by me for thoroughness and accuracy.   ------------------------------------------------------------------------------------------------------------    Trey Sailors, PA-C  [facility]

## 2018-01-05 ENCOUNTER — Other Ambulatory Visit: Payer: Self-pay | Admitting: Physician Assistant

## 2018-01-05 DIAGNOSIS — G20C Parkinsonism, unspecified: Secondary | ICD-10-CM | POA: Insufficient documentation

## 2018-01-05 DIAGNOSIS — G20 Parkinson's disease: Secondary | ICD-10-CM | POA: Insufficient documentation

## 2018-01-05 LAB — LIPID PANEL
Chol/HDL Ratio: 3 ratio (ref 0.0–4.4)
Cholesterol, Total: 164 mg/dL (ref 100–199)
HDL: 54 mg/dL (ref >39)
LDL Calculated: 89 mg/dL (ref 0–99)
Triglycerides: 107 mg/dL (ref 0–149)
VLDL Cholesterol Cal: 21 mg/dL (ref 5–40)

## 2018-01-05 LAB — COMPREHENSIVE METABOLIC PANEL
ALT: 13 IU/L (ref 0–32)
AST: 37 IU/L (ref 0–40)
Albumin/Globulin Ratio: 1.7 (ref 1.2–2.2)
Albumin: 4.5 g/dL (ref 3.6–4.8)
Alkaline Phosphatase: 102 IU/L (ref 39–117)
BUN/Creatinine Ratio: 12 (ref 12–28)
BUN: 10 mg/dL (ref 8–27)
Bilirubin Total: 0.8 mg/dL (ref 0.0–1.2)
CO2: 23 mmol/L (ref 20–29)
Calcium: 9.4 mg/dL (ref 8.7–10.3)
Chloride: 89 mmol/L — ABNORMAL LOW (ref 96–106)
Creatinine, Ser: 0.82 mg/dL (ref 0.57–1.00)
GFR calc Af Amer: 84 mL/min/{1.73_m2} (ref >59)
GFR calc non Af Amer: 73 mL/min/{1.73_m2} (ref >59)
Globulin, Total: 2.7 g/dL (ref 1.5–4.5)
Glucose: 97 mg/dL (ref 65–99)
Potassium: 4.3 mmol/L (ref 3.5–5.2)
Sodium: 129 mmol/L — ABNORMAL LOW (ref 134–144)
Total Protein: 7.2 g/dL (ref 6.0–8.5)

## 2018-01-05 LAB — TSH: TSH: 1.58 u[IU]/mL (ref 0.450–4.500)

## 2018-01-06 ENCOUNTER — Ambulatory Visit: Payer: Medicare Other

## 2018-01-06 ENCOUNTER — Telehealth: Payer: Self-pay

## 2018-01-06 NOTE — Telephone Encounter (Signed)
Patient advised.KW 

## 2018-01-06 NOTE — Telephone Encounter (Signed)
-----   Message from Trey Sailors, [place] sent at 01/05/2018  2:05 PM EST ----- Labwork stable, her elctrolytes are a little low. Would encourage her to keep up with fluid restriction as advised by Dr. Tedd Sias. Follow up in 6 mo.

## 2018-01-11 ENCOUNTER — Ambulatory Visit: Payer: Medicare Other | Admitting: Speech Pathology

## 2018-01-11 ENCOUNTER — Encounter: Payer: Self-pay | Admitting: Speech Pathology

## 2018-01-11 DIAGNOSIS — R490 Dysphonia: Secondary | ICD-10-CM

## 2018-01-11 NOTE — Therapy (Signed)
[place] [facility] MAIN [place] SERVICES [address] [place], [place], [address] Phone: [phone]   Fax:  [phone]  Speech Language Pathology Treatment  Patient Details  Name: Lori Brady MRN: [id number] Date of Birth: [birth year] Referring Provider: Dr. Sherryll Burger   Encounter Date: 01/11/2018  End of Session - 01/11/18 1620    Visit Number  12    Number of Visits  16    Date for SLP Re-Evaluation  01/13/18    SLP Start Time  1300    SLP Stop Time   1400    SLP Time Calculation (min)  60 min    Activity Tolerance  Patient tolerated treatment well       Past Medical History:  Diagnosis Date  . Arthritis   . Asthma   . Crohn's disease (HCC)   . Emphysema of lung (HCC)   . GERD (gastroesophageal reflux disease)   . Hip fracture (HCC) 01/26/2017  . History of chicken pox   . History of heart attack   . Hx of completed stroke   . Hypercholesteremia   . Hypertension   . Pneumonia   . Seasonal allergies   . Sepsis (HCC)   . Septic shock (HCC) 10/08/2016  . UTI (lower urinary tract infection)     Past Surgical History:  Procedure Laterality Date  . BREAST BIOPSY Bilateral 1960's to 90's  . CATARACT EXTRACTION, BILATERAL      There were no vitals filed for this visit.  Subjective Assessment - 01/11/18 1620    Subjective  "I'll be honest, I've been sick, and I haven't been practicing as much as usual.  I didn't feel like it"    Currently in Pain?  No/denies            ADULT SLP TREATMENT - 01/11/18 0001      General Information   Behavior/Cognition  Alert;Cooperative;Pleasant mood    HPI   72 year old woman diagnosed with paralyzed vocal cord (secondary stroke in 2007) and recent diagnosis of Parkinson's disease        Cognitive-Linquistic Treatment   Treatment focused on  Voice    Skilled Treatment LOUD "AH":  Sustain 'ah' (with good quality voice) at average 91.6 dB for average 8.3 seconds (X15). PITCH GLIDES: High effort  pitch glide with loud, good quality voice X10.  HIERARCHIAL SPEECH DRILL:  Maintain average 73 dB and good quality voice with recitation and 70 dB with good quality voice reading sentences with 80% accuracy.  Maintain 70 dB with good quality voice for generating words and phrases with 80% accuracy.  Conversational turns with average 70 dB. LOUD "AH":  Sustain 'ah' (with good quality voice) at average 91.6 dB for average 8.3 seconds (X15).  PITCH GLIDES: High effort pitch glide with loud, good quality voice X10.   HIERARCHIAL SPEECH DRILL:  Maintains average of 75 dB and good quality voice with recitation and 75 dB with good quality voice reading sentences with 80% accuracy.  Maintains 75 dB with good quality voice for generating words and phrases with 80% accuracy.  Conversational turns with average 75 dB.     Assessment / Recommendations / Plan   Plan  Continue with current plan of care      Progression Toward Goals   Progression toward goals  Progressing toward goals       SLP Education - 01/11/18 1620    Education provided  Yes    Education Details  continue HEP  Person(s) Educated  Patient    Methods  Explanation;Demonstration;Verbal cues    Comprehension  Verbalized understanding;Returned demonstration;Verbal cues required         SLP Long Term Goals - 01/11/18 1622      SLP LONG TERM GOAL #1   Title  The patient will be independent for abdominal breathing and breath support exercises.    Time  1    Period  Weeks    Status  On-going    Target Date  01/13/18      SLP LONG TERM GOAL #2   Title  The patient will maximize voice quality and loudness using breath support/oral resonance for sustained vowel production, pitch glides, and hierarchal speech drill.    Time  1    Period  Weeks    Status  On-going    Target Date  01/13/18      SLP LONG TERM GOAL #3   Title  The patient will maximize voice quality and loudness using breath support/oral resonance for paragraph length  recitation with 80% accuracy.    Time  1    Period  Weeks    Status  On-going    Target Date  01/13/18       Plan - 01/11/18 1622    Clinical Impression Statement Pt continues to benefit from skilled ST treatment for increased vocal quality and loudness, and decreased tension. 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  Pt reports getting positive feedback from family regarding improvement in her voice. Recommend continued ST intervention for improvement of functional voice production.   Speech Therapy Frequency  2x / week    Duration  -- 8 weeks    Treatment/Interventions  Patient/family education    Potential to Achieve Goals  Good    Potential Considerations  Ability to learn/carryover information;Co-morbidities;Cooperation/participation level;Medical prognosis;Previous level of function;Severity of impairments;Family/community support    SLP Home Exercise Plan  Breath support exercises, resonant voice exercises, voice building routine    Consulted and Agree with Plan of Care  Patient       Patient will benefit from skilled therapeutic intervention in order to improve the following deficits and impairments:   Dysphonia    Problem List Patient Active Problem List   Diagnosis Date Noted  . Primary Parkinsonism (HCC) 01/05/2018  . SIADH (syndrome of inappropriate ADH production) (HCC) 10/01/2017  . Spinal stenosis 10/01/2017  . COPD (chronic obstructive pulmonary disease) with chronic bronchitis (HCC) 08/12/2016  . Asthma without status asthmaticus 03/31/2016  . Osteoporosis 03/31/2016  . Fatty liver determined by biopsy 09/17/2015  . Parotid swelling 09/13/2015  . Urticaria 09/13/2015  . Cerebrovascular accident (CVA)  (HCC) 08/21/2015  . Senile purpura (HCC) 08/21/2015  . Atrial tachycardia (HCC) 08/13/2015  . Eczema 07/10/2015  . Allergic rhinitis 06/26/2015  . Hyperkalemia 05/03/2015  . Anxiety 04/23/2015  . Edema leg 03/27/2015  . Primary cardiomyopathy (HCC) 03/05/2015  . Benign essential HTN 02/28/2015  . Combined fat and carbohydrate induced hyperlipemia 09/06/2014  . COPD, severe (HCC) 08/01/2014  . Acid reflux 08/01/2014   Jahson Emanuele B. Murvin Natal, [facility], CCC-SLP Speech Language Pathologist  Leigh Aurora 01/11/2018, 4:27 PM  [facility] MAIN [facility] SERVICES [address] [place], [place], [address] Phone:  [phone]   Fax:  [phone]   Name: Lori Brady MRN: [id number] Date of Birth: [birth year]

## 2018-01-13 ENCOUNTER — Ambulatory Visit: Payer: Medicare Other | Admitting: Speech Pathology

## 2018-01-13 ENCOUNTER — Encounter: Payer: Self-pay | Admitting: Speech Pathology

## 2018-01-13 DIAGNOSIS — R490 Dysphonia: Secondary | ICD-10-CM | POA: Diagnosis not present

## 2018-01-13 NOTE — Therapy (Signed)
[place] MAIN [facility] SERVICES [address] [place], [place], [address] Phone: [phone]   Fax:  [phone]  Speech Language Pathology Treatment  Patient Details  Name: Lori Brady MRN: [id number] Date of Birth: August 31, 1946 Referring Provider: Dr. Manuella Ghazi   Encounter Date: 01/13/2018  End of Session - 01/13/18 1323    Visit Number  12    Number of Visits  16    Date for SLP Re-Evaluation  01/13/18    SLP Start Time  1300    SLP Stop Time   3419    SLP Time Calculation (min)  55 min    Activity Tolerance  Patient tolerated treatment well       Past Medical History:  Diagnosis Date  . Arthritis   . Asthma   . Crohn's disease ([place])   . Emphysema of lung ([place])   . GERD (gastroesophageal reflux disease)   . Hip fracture ([place]) 01/26/2017  . History of chicken pox   . History of heart attack   . Hx of completed stroke   . Hypercholesteremia   . Hypertension   . Pneumonia   . Seasonal allergies   . Sepsis ([place])   . Septic shock ([place]) 10/08/2016  . UTI (lower urinary tract infection)     Past Surgical History:  Procedure Laterality Date  . BREAST BIOPSY Bilateral 1960's to 90's  . CATARACT EXTRACTION, BILATERAL      There were no vitals filed for this visit.  Subjective Assessment - 01/13/18 1322    Subjective  I've been practicing the open yawn at home. I think I'm better, but I want to keep coming to therapy    Currently in Pain?  No/denies            ADULT SLP TREATMENT - 01/13/18 0001      General Information   Behavior/Cognition  Alert;Cooperative;Pleasant mood    HPI   72 year old woman diagnosed with paralyzed vocal cord (secondary stroke in 2007) and recent diagnosis of Parkinson's disease        Treatment Provided   Treatment provided  Cognitive-Linquistic      Pain Assessment   Pain Assessment  No/denies pain      Cognitive-Linquistic Treatment   Treatment focused on  Voice    Skilled Treatment   Maximum phonation time avg 9.2 sec, /s/ 6.32 sec, /z/ max 11.41 sec (both at 4 seconds on evaluation). Reading of [place] up to 78dB max, avg 70dB (max 70dB, avg 67.25dB on evaluation). Voice Handicap Index (VHI) 38 today (82 on evaluation). Pt is pleased with her progress, but would benefit from continued skilled ST intervention focusing on continued increase in functional loudness, maximizing vocal quality, and lower VHI scale.      Assessment / Recommendations / Plan   Plan  Continue with current plan of care      Progression Toward Goals   Progression toward goals  Progressing toward goals       SLP Education - 01/13/18 1323    Education provided  Yes    Education Details  continue HEP    Person(s) Educated  Patient    Methods  Explanation;Demonstration;Verbal cues    Comprehension  Verbalized understanding;Returned demonstration;Verbal cues required         SLP Long Term Goals - 01/13/18 1333      SLP LONG TERM GOAL #1   Title  The patient will be independent for abdominal breathing and breath  support exercises.    Status  Achieved    Target Date  01/13/18      SLP LONG TERM GOAL #2   Title  The patient will maximize voice quality and loudness using breath support/oral resonance for sustained vowel production, pitch glides, and hierarchal speech drill.    Status  Partially Met    Target Date  01/13/18      SLP LONG TERM GOAL #3   Title  The patient will maximize voice quality and loudness using breath support/oral resonance for paragraph length recitation with 80% accuracy.    Status  Partially Met    Target Date  01/13/18      SLP LONG TERM GOAL #4   Title  Pt will score <30 on Voice Handicap Index (VHI)    Baseline  82 first of January, 38 first of March    Time  3    Period  Weeks    Status  New    Target Date  02/03/18       Plan - 01/13/18 1324    Clinical Impression Statement  Pt continues to show progress toward goals. She has met 1/3 goals during  this certification period. She reports others have noticed improvement in her voice, and has improved on the voice handicap index (VHI) from 82 to 38. She would like to continue treatment for 3 more weeks, with focus continuing on maximizing vocal quality, increased volume and maximum phonation time (MPT), improvement in group settings, and continued improvement in VHI. Pt would benefit from continued skilled intervention targeting these functional goals for improvement of voicing and quality of life.     Speech Therapy Frequency  2x / week    Duration  -- 3 more weeks    Treatment/Interventions  Patient/family education;Multimodal communcation approach;Compensatory strategies;SLP instruction and feedback;Internal/external aids    Potential to Achieve Goals  Good    Potential Considerations  Ability to learn/carryover information;Co-morbidities;Cooperation/participation level;Medical prognosis;Previous level of function;Severity of impairments;Family/community support    SLP Home Exercise Plan  Breath support exercises, vocal relaxation and strengthening    Consulted and Agree with Plan of Care  Patient       Patient will benefit from skilled therapeutic intervention in order to improve the following deficits and impairments:   Dysphonia - Plan: SLP plan of care cert/re-cert    Problem List Patient Active Problem List   Diagnosis Date Noted  . Primary Parkinsonism ([place]) 01/05/2018  . SIADH (syndrome of inappropriate ADH production) ([place]) 10/01/2017  . Spinal stenosis 10/01/2017  . COPD (chronic obstructive pulmonary disease) with chronic bronchitis ([place]) 08/12/2016  . Asthma without status asthmaticus 03/31/2016  . Osteoporosis 03/31/2016  . Fatty liver determined by biopsy 09/17/2015  . Parotid swelling 09/13/2015  . Urticaria 09/13/2015  . Cerebrovascular accident (CVA) ([place]) 08/21/2015  . Senile purpura ([place]) 08/21/2015  . Atrial tachycardia ([place]) 08/13/2015  . Eczema 07/10/2015  .  Allergic rhinitis 06/26/2015  . Hyperkalemia 05/03/2015  . Anxiety 04/23/2015  . Edema leg 03/27/2015  . Primary cardiomyopathy ([place]) 03/05/2015  . Benign essential HTN 02/28/2015  . Combined fat and carbohydrate induced hyperlipemia 09/06/2014  . COPD, severe ([place]) 08/01/2014  . Acid reflux 08/01/2014   Ethie Curless B. Quentin Ore [facility], CCC-SLP Speech Language Pathologist  Shonna Chock 01/13/2018, 3:23 PM  [place] MAIN [facility] SERVICES [address] [place], [place], [address] Phone: [phone]   Fax:  [phone]   Name: Lori Brady MRN: [id number] Date  of Birth: Aug 20, 1946

## 2018-01-20 ENCOUNTER — Ambulatory Visit: Payer: Medicare Other | Attending: Neurology | Admitting: Speech Pathology

## 2018-01-20 ENCOUNTER — Encounter: Payer: Self-pay | Admitting: Speech Pathology

## 2018-01-20 DIAGNOSIS — R278 Other lack of coordination: Secondary | ICD-10-CM | POA: Insufficient documentation

## 2018-01-20 DIAGNOSIS — R490 Dysphonia: Secondary | ICD-10-CM

## 2018-01-20 DIAGNOSIS — M6281 Muscle weakness (generalized): Secondary | ICD-10-CM | POA: Diagnosis present

## 2018-01-20 DIAGNOSIS — R2681 Unsteadiness on feet: Secondary | ICD-10-CM | POA: Diagnosis present

## 2018-01-20 DIAGNOSIS — R262 Difficulty in walking, not elsewhere classified: Secondary | ICD-10-CM | POA: Diagnosis present

## 2018-01-20 NOTE — Therapy (Signed)
[place] MAIN [facility] SERVICES [address] [place], [place], [address] Phone: [phone]   Fax:  [phone]  Speech Language Pathology Treatment  Patient Details  Name: Lori Brady MRN: [id number] Date of Birth: [birth year] Referring Provider: Dr. Manuella Ghazi   Encounter Date: 01/20/2018  End of Session - 01/20/18 1214    Date for SLP Re-Evaluation  02/11/18       Past Medical History:  Diagnosis Date  . Arthritis   . Asthma   . Crohn's disease ([place])   . Emphysema of lung ([place])   . GERD (gastroesophageal reflux disease)   . Hip fracture ([place]) 01/26/2017  . History of chicken pox   . History of heart attack   . Hx of completed stroke   . Hypercholesteremia   . Hypertension   . Pneumonia   . Seasonal allergies   . Sepsis ([place])   . Septic shock ([place]) 10/08/2016  . UTI (lower urinary tract infection)     Past Surgical History:  Procedure Laterality Date  . BREAST BIOPSY Bilateral 1960's to 90's  . CATARACT EXTRACTION, BILATERAL      There were no vitals filed for this visit.  Subjective Assessment - 01/20/18 1112    Subjective  I don't have any pain    Currently in Pain?  No/denies            ADULT SLP TREATMENT - 01/20/18 0001      General Information   Behavior/Cognition  Alert;Cooperative;Pleasant mood    HPI   72 year old woman diagnosed with paralyzed vocal cord (secondary stroke in 2007) and recent diagnosis of Parkinson's disease        Treatment Provided   Treatment provided  Cognitive-Linquistic      Pain Assessment   Pain Assessment  No/denies pain      Cognitive-Linquistic Treatment   Treatment focused on  Voice    Skilled Treatment LOUD "AH":  Sustain 'ah' (with good quality voice) at average 88.6 dB for average 9.2 seconds (X15).  PITCH GLIDES: High effort pitch glide with loud, good quality voice X10.    HIERARCHIAL SPEECH DRILL:  Maintain average 73 dB and good quality voice with recitation and 70  dB with good quality voice reading sentences with 80% accuracy.  Maintain 75 dB with good quality voice for generating words and phrases with 80% accuracy.  Conversational turns with average 80 dB.   SLP and pt reviewed vocal relaxation and strengthening exercises for proper technique. Pt encouraged to continue to complete exercises daily.  Pt's voice quality was noted to improve when breathing deeply through the nose, possibly due to filter/moistening of nasal passages.      Assessment / Recommendations / Plan   Plan  Continue with current plan of care      Progression Toward Goals   Progression toward goals  Progressing toward goals       SLP Education - 01/20/18 1209    Education provided  Yes    Education Details  reviewed vocal relaxation and strengthening exercises    Person(s) Educated  Patient    Methods  Explanation;Demonstration;Verbal cues;Handout    Comprehension  Verbalized understanding;Returned demonstration;Verbal cues required         SLP Long Term Goals - 01/20/18 1213      SLP LONG TERM GOAL #1   Title  The patient will be independent for abdominal breathing and breath support exercises.    Status  Achieved  SLP LONG TERM GOAL #2   Time  3    Period  Weeks    Status  Partially Met    Target Date  02/11/18      SLP LONG TERM GOAL #3   Title  The patient will maximize voice quality and loudness using breath support/oral resonance for paragraph length recitation with 80% accuracy.    Time  3    Period  Weeks    Status  Partially Met    Target Date  02/11/18      SLP LONG TERM GOAL #4   Title  Pt will score <30 on Voice Handicap Index (VHI)    Baseline  82 first of January, 38 first of March    Time  3    Period  Weeks    Status  On-going    Target Date  02/11/18       Plan - 01/20/18 1210    Clinical Impression Statement  Pt voice quality, loudness, and sustainability are noted to improve when pt takes a slow deep breath through her nose.  This is a change from her habitual mouth breathing, and requires ongoing cues to remind her. Pt continues to be motivated to continue improvement with voice, and reports completing exercises daily. Continued skilled ST intervention is recommended to maximize voice quality.     Speech Therapy Frequency  2x / week    Duration  -- 3 weeks    Treatment/Interventions  Patient/family education;Multimodal communcation approach;Compensatory strategies;SLP instruction and feedback;Internal/external aids    Potential to Achieve Goals  Good    Potential Considerations  Ability to learn/carryover information;Co-morbidities;Cooperation/participation level;Medical prognosis;Previous level of function;Severity of impairments;Family/community support    SLP Home Exercise Plan  Breath support exercises, vocal relaxation and strengthening    Consulted and Agree with Plan of Care  Patient       Patient will benefit from skilled therapeutic intervention in order to improve the following deficits and impairments:   Dysphonia    Problem List Patient Active Problem List   Diagnosis Date Noted  . Primary Parkinsonism ([place]) 01/05/2018  . SIADH (syndrome of inappropriate ADH production) ([place]) 10/01/2017  . Spinal stenosis 10/01/2017  . COPD (chronic obstructive pulmonary disease) with chronic bronchitis ([place]) 08/12/2016  . Asthma without status asthmaticus 03/31/2016  . Osteoporosis 03/31/2016  . Fatty liver determined by biopsy 09/17/2015  . Parotid swelling 09/13/2015  . Urticaria 09/13/2015  . Cerebrovascular accident (CVA) ([place]) 08/21/2015  . Senile purpura ([place]) 08/21/2015  . Atrial tachycardia ([place]) 08/13/2015  . Eczema 07/10/2015  . Allergic rhinitis 06/26/2015  . Hyperkalemia 05/03/2015  . Anxiety 04/23/2015  . Edema leg 03/27/2015  . Primary cardiomyopathy ([place]) 03/05/2015  . Benign essential HTN 02/28/2015  . Combined fat and carbohydrate induced hyperlipemia 09/06/2014  . COPD, severe ([place])  08/01/2014  . Acid reflux 08/01/2014   Queena Monrreal B. Quentin Ore [facility], CCC-SLP Speech Language Pathologist   Shonna Chock 01/20/2018, 12:15 PM  [place] MAIN [facility] SERVICES [address] [place], [place], [address] Phone: [phone]   Fax:  [phone]   Name: JAELEEN INZUNZA MRN: [id number] Date of Birth: 25-Feb-1946

## 2018-01-25 ENCOUNTER — Other Ambulatory Visit: Payer: Self-pay | Admitting: Physician Assistant

## 2018-01-25 DIAGNOSIS — E782 Mixed hyperlipidemia: Secondary | ICD-10-CM

## 2018-01-28 ENCOUNTER — Ambulatory Visit: Payer: Medicare Other | Admitting: Speech Pathology

## 2018-01-28 ENCOUNTER — Encounter: Payer: Self-pay | Admitting: Speech Pathology

## 2018-01-28 DIAGNOSIS — R490 Dysphonia: Secondary | ICD-10-CM | POA: Diagnosis not present

## 2018-01-28 NOTE — Therapy (Signed)
[place] MAIN [facility] SERVICES [address] [place], [place], [address] Phone: [phone]   Fax:  [phone]  Speech Language Pathology Treatment  Patient Details  Name: Lori Brady MRN: [id number] Date of Birth: October 28, 1946 Referring Provider: Dr. Manuella Ghazi   Encounter Date: 01/28/2018  End of Session - 01/28/18 1601    Visit Number  14    Number of Visits  16    Date for SLP Re-Evaluation  02/11/18    SLP Start Time  1355    SLP Stop Time   1445    SLP Time Calculation (min)  50 min    Activity Tolerance  Patient tolerated treatment well       Past Medical History:  Diagnosis Date  . Arthritis   . Asthma   . Crohn's disease ([place])   . Emphysema of lung ([place])   . GERD (gastroesophageal reflux disease)   . Hip fracture ([place]) 01/26/2017  . History of chicken pox   . History of heart attack   . Hx of completed stroke   . Hypercholesteremia   . Hypertension   . Pneumonia   . Seasonal allergies   . Sepsis ([place])   . Septic shock ([place]) 10/08/2016  . UTI (lower urinary tract infection)     Past Surgical History:  Procedure Laterality Date  . BREAST BIOPSY Bilateral 1960's to 90's  . CATARACT EXTRACTION, BILATERAL      There were no vitals filed for this visit.  Subjective Assessment - 01/28/18 1559    Subjective  Patient reports that family and friends tell her she sounds better            ADULT SLP TREATMENT - 01/28/18 0001      General Information   Behavior/Cognition  Alert;Cooperative;Pleasant mood    HPI   72 year old woman diagnosed with paralyzed vocal cord (secondary stroke in 2007) and recent diagnosis of Parkinson's disease        Treatment Provided   Treatment provided  Cognitive-Linquistic      Pain Assessment   Pain Assessment  No/denies pain      Cognitive-Linquistic Treatment   Treatment focused on  Voice    Skilled Treatment  LOUD "AH":  Sustain 'ah' (with good quality voice) at average 83 dB for  average 10 seconds (X15). PITCH GLIDES: High effort pitch glide with loud, good quality voice X10.  HIERARCHIAL SPEECH DRILL:  Maintain average 73 dB and good quality voice with recitation and 70 dB with good quality voice reading sentences with 80% accuracy.  Maintain 70 dB with good quality voice for generating words and phrases with 80% accuracy.  Conversational turns with average 70 dB.      Assessment / Recommendations / Plan   Plan  Continue with current plan of care      Progression Toward Goals   Progression toward goals  Progressing toward goals       SLP Education - 01/28/18 1600    Education provided  Yes    Education Details  vocal loudness    Person(s) Educated  Patient    Methods  Explanation;Demonstration    Comprehension  Verbalized understanding;Returned demonstration         SLP Long Term Goals - 01/20/18 1213      SLP LONG TERM GOAL #1   Title  The patient will be independent for abdominal breathing and breath support exercises.    Status  Achieved  SLP LONG TERM GOAL #2   Time  3    Period  Weeks    Status  Partially Met    Target Date  02/11/18      SLP LONG TERM GOAL #3   Title  The patient will maximize voice quality and loudness using breath support/oral resonance for paragraph length recitation with 80% accuracy.    Time  3    Period  Weeks    Status  Partially Met    Target Date  02/11/18      SLP LONG TERM GOAL #4   Title  Pt will score <30 on Voice Handicap Index (VHI)    Baseline  82 first of January, 38 first of March    Time  3    Period  Weeks    Status  On-going    Target Date  02/11/18       Plan - 01/28/18 1602    Speech Therapy Frequency  2x / week    Treatment/Interventions  Patient/family education;Multimodal communcation approach;Compensatory strategies;SLP instruction and feedback;Internal/external aids Voice therapy    Potential to Achieve Goals  Good    SLP Home Exercise Plan  Breath support exercises, vocal  relaxation and strengthening    Consulted and Agree with Plan of Care  Patient       Patient will benefit from skilled therapeutic intervention in order to improve the following deficits and impairments:   Dysphonia    Problem List Patient Active Problem List   Diagnosis Date Noted  . Primary Parkinsonism ([place]) 01/05/2018  . SIADH (syndrome of inappropriate ADH production) ([place]) 10/01/2017  . Spinal stenosis 10/01/2017  . COPD (chronic obstructive pulmonary disease) with chronic bronchitis ([place]) 08/12/2016  . Asthma without status asthmaticus 03/31/2016  . Osteoporosis 03/31/2016  . Fatty liver determined by biopsy 09/17/2015  . Parotid swelling 09/13/2015  . Urticaria 09/13/2015  . Cerebrovascular accident (CVA) ([place]) 08/21/2015  . Senile purpura ([place]) 08/21/2015  . Atrial tachycardia ([place]) 08/13/2015  . Eczema 07/10/2015  . Allergic rhinitis 06/26/2015  . Hyperkalemia 05/03/2015  . Anxiety 04/23/2015  . Edema leg 03/27/2015  . Primary cardiomyopathy ([place]) 03/05/2015  . Benign essential HTN 02/28/2015  . Combined fat and carbohydrate induced hyperlipemia 09/06/2014  . COPD, severe ([place]) 08/01/2014  . Acid reflux 08/01/2014   Leroy Sea, MS/CCC- SLP  Lou Miner 01/28/2018, 4:03 PM  [place] [facility] MAIN [facility] SERVICES [address] [place], [place], [address] Phone: [phone]   Fax:  [phone]   Name: Lori Brady MRN: [id number] Date of Birth: Sep 06, 1946

## 2018-02-01 ENCOUNTER — Encounter: Payer: Self-pay | Admitting: Speech Pathology

## 2018-02-01 ENCOUNTER — Ambulatory Visit: Payer: Medicare Other | Admitting: Speech Pathology

## 2018-02-01 DIAGNOSIS — R490 Dysphonia: Secondary | ICD-10-CM

## 2018-02-01 NOTE — Therapy (Signed)
[place] [facility] MAIN [facility] SERVICES [address] [place], [place], [address] Phone: [phone]   Fax:  [phone]  Speech Language Pathology Treatment/Discharge Summary  Patient Details  Name: Lori Brady MRN: [id number] Date of Birth: 02-28-1946 Referring Provider: Dr. Sherryll Burger   Encounter Date: 02/01/2018  End of Session - 02/01/18 1456    Visit Number  15    Number of Visits  16    Date for SLP Re-Evaluation  02/11/18    SLP Start Time  1400    SLP Stop Time   1450    SLP Time Calculation (min)  50 min    Activity Tolerance  Patient tolerated treatment well       Past Medical History:  Diagnosis Date  . Arthritis   . Asthma   . Crohn's disease (HCC)   . Emphysema of lung (HCC)   . GERD (gastroesophageal reflux disease)   . Hip fracture (HCC) 01/26/2017  . History of chicken pox   . History of heart attack   . Hx of completed stroke   . Hypercholesteremia   . Hypertension   . Pneumonia   . Seasonal allergies   . Sepsis (HCC)   . Septic shock (HCC) 10/08/2016  . UTI (lower urinary tract infection)     Past Surgical History:  Procedure Laterality Date  . BREAST BIOPSY Bilateral 1960's to 90's  . CATARACT EXTRACTION, BILATERAL      There were no vitals filed for this visit.  Subjective Assessment - 02/01/18 1456    Subjective  Patient reports that family and friends tell her she sounds better            ADULT SLP TREATMENT - 02/01/18 0001      General Information   Behavior/Cognition  Alert;Cooperative;Pleasant mood    HPI   72 year old woman diagnosed with paralyzed vocal cord (secondary stroke in 2007) and recent diagnosis of Parkinson's disease        Treatment Provided   Treatment provided  Cognitive-Linquistic      Pain Assessment   Pain Assessment  No/denies pain      Cognitive-Linquistic Treatment   Treatment focused on  Voice    Skilled Treatment  LOUD "AH":  Sustain 'ah' (with good quality voice) at  average 83 dB for average 10 seconds (X15). PITCH GLIDES: High effort pitch glide with loud, good quality voice X10.  HIERARCHIAL SPEECH DRILL:  Maintain average 73 dB and good quality voice with recitation and 70 dB with good quality voice reading paragraphs with 80% accuracy.  Maintain 70 dB with good quality voice for generating words and phrases with 80% accuracy.  Conversational turns with average 70 dB.      Assessment / Recommendations / Plan   Plan  Continue with current plan of care      Progression Toward Goals   Progression toward goals  Progressing toward goals       SLP Education - 02/01/18 1456    Education provided  Yes    Education Details  HEP    Person(s) Educated  Patient    Methods  Explanation;Handout    Comprehension  Verbalized understanding;Returned demonstration         SLP Long Term Goals - 02/01/18 1458      SLP LONG TERM GOAL #1   Title  The patient will be independent for abdominal breathing and breath support exercises.    Status  Achieved  SLP LONG TERM GOAL #2   Title  The patient will maximize voice quality and loudness using breath support/oral resonance for sustained vowel production, pitch glides, and hierarchal speech drill.    Status  Achieved      SLP LONG TERM GOAL #3   Title  The patient will maximize voice quality and loudness using breath support/oral resonance for paragraph length recitation with 80% accuracy.    Status  Achieved       Plan - 02/01/18 1457    Clinical Impression Statement  The patient is able to generate a louder, better quality voice with focus on vocal loudness and breath support.  This aids vocal quality due to vocal fold paralysis as well as hypophonia due to Parkinson's.  Patient reports getting positive feedback from family regarding her voice and ease of hearing her.  The patient has a good understanding of how the vocal loudness and breath support aid her vocal quality and loudness.  She is independent in  her home exercise program and is ready for discharge.     Speech Therapy Frequency  Other (comment) Discharge    Treatment/Interventions  Patient/family education;Multimodal communcation approach;Compensatory strategies;SLP instruction and feedback;Internal/external aids Voice therapy    Potential to Achieve Goals  Good    Potential Considerations  Ability to learn/carryover information;Co-morbidities;Cooperation/participation level;Medical prognosis;Previous level of function;Severity of impairments;Family/community support    SLP Home Exercise Plan  Breath support exercises, vocal relaxation and strengthening    Consulted and Agree with Plan of Care  Patient       Patient will benefit from skilled therapeutic intervention in order to improve the following deficits and impairments:   Dysphonia    Problem List Patient Active Problem List   Diagnosis Date Noted  . Primary Parkinsonism (HCC) 01/05/2018  . SIADH (syndrome of inappropriate ADH production) (HCC) 10/01/2017  . Spinal stenosis 10/01/2017  . COPD (chronic obstructive pulmonary disease) with chronic bronchitis (HCC) 08/12/2016  . Asthma without status asthmaticus 03/31/2016  . Osteoporosis 03/31/2016  . Fatty liver determined by biopsy 09/17/2015  . Parotid swelling 09/13/2015  . Urticaria 09/13/2015  . Cerebrovascular accident (CVA) (HCC) 08/21/2015  . Senile purpura (HCC) 08/21/2015  . Atrial tachycardia (HCC) 08/13/2015  . Eczema 07/10/2015  . Allergic rhinitis 06/26/2015  . Hyperkalemia 05/03/2015  . Anxiety 04/23/2015  . Edema leg 03/27/2015  . Primary cardiomyopathy (HCC) 03/05/2015  . Benign essential HTN 02/28/2015  . Combined fat and carbohydrate induced hyperlipemia 09/06/2014  . COPD, severe (HCC) 08/01/2014  . Acid reflux 08/01/2014    Leandrew Koyanagi 02/01/2018, 2:59 PM  [place] [facility] MAIN [facility] SERVICES [address] [place], [place], [address] Phone: [phone]    Fax:  [phone]   Name: Lori Brady MRN: [id number] Date of Birth: 1946/04/13

## 2018-02-03 ENCOUNTER — Encounter: Payer: Medicare Other | Admitting: Speech Pathology

## 2018-02-07 ENCOUNTER — Other Ambulatory Visit: Payer: Self-pay

## 2018-02-07 ENCOUNTER — Ambulatory Visit: Payer: Medicare Other | Admitting: Occupational Therapy

## 2018-02-07 ENCOUNTER — Encounter: Payer: Self-pay | Admitting: Occupational Therapy

## 2018-02-07 DIAGNOSIS — R262 Difficulty in walking, not elsewhere classified: Secondary | ICD-10-CM

## 2018-02-07 DIAGNOSIS — R278 Other lack of coordination: Secondary | ICD-10-CM

## 2018-02-07 DIAGNOSIS — M6281 Muscle weakness (generalized): Secondary | ICD-10-CM

## 2018-02-07 DIAGNOSIS — R490 Dysphonia: Secondary | ICD-10-CM | POA: Diagnosis not present

## 2018-02-07 DIAGNOSIS — R2681 Unsteadiness on feet: Secondary | ICD-10-CM

## 2018-02-12 NOTE — Therapy (Signed)
[place] [facility] MAIN [facility] SERVICES [address] [place], [place], [address] Phone: [phone]   Fax:  [phone]  Occupational Therapy Evaluation  Patient Details  Name: Lori Brady MRN: [id number] Date of Birth: 13-Oct-1946 Referring Provider: Steele Sizer   Encounter Date: 02/07/2018  OT End of Session - 02/12/18 2103    Visit Number  1    Number of Visits  17    Date for OT Re-Evaluation  03/18/18    OT Start Time  1300    OT Stop Time  1400    OT Time Calculation (min)  60 min    Activity Tolerance  Patient tolerated treatment well    Behavior During Therapy  [facility] for tasks assessed/performed       Past Medical History:  Diagnosis Date  . Arthritis   . Asthma   . Crohn's disease (HCC)   . Emphysema of lung (HCC)   . GERD (gastroesophageal reflux disease)   . Hip fracture (HCC) 01/26/2017  . History of chicken pox   . History of heart attack   . Hx of completed stroke   . Hypercholesteremia   . Hypertension   . Pneumonia   . Seasonal allergies   . Sepsis (HCC)   . Septic shock (HCC) 10/08/2016  . UTI (lower urinary tract infection)     Past Surgical History:  Procedure Laterality Date  . BREAST BIOPSY Bilateral 1960's to 90's  . CATARACT EXTRACTION, BILATERAL      There were no vitals filed for this visit.     [facility] OT Assessment - 02/12/18 2102      Assessment   Medical Diagnosis  Parkinson's disease    Referring Provider  Sherryll Burger, H    Prior Therapy  PT, SLP      Precautions   Precautions  Fall      Balance Screen   Has the patient fallen in the past 6 months  Yes    How many times?  2    Has the patient had a decrease in activity level because of a fear of falling?   Yes    Is the patient reluctant to leave their home because of a fear of falling?   Yes      Home  Environment   Family/patient expects to be discharged to:  Private residence    Living Arrangements  Other relatives    Available Help at Discharge   Family    Type of Home  House    Home Access  Stairs    Home Layout  One level    Bathroom Shower/Tub  Walk-in Shower;Door    Engineer, maintenance (IT) - 2 wheels;Grab bars - tub/shower    Lives With  Family      Prior Function   Level of Independence  Independent    Vocation  Retired    Gaffer  used to work in [employer]     Leisure  likes to read and garden      ADL   Eating/Feeding  Modified independent    Grooming  Modified independent    Upper Body Bathing  Modified independent    Lower Body Bathing  Modified independent    Upper Body Dressing  Increased time    Lower Body Dressing  Increased time    Toilet Transfer  Modified independent    Toileting - Clothing Manipulation  Increased time  Toileting -  Hygiene  Increase time    Tub/Shower Transfer  Modified independent      IADL   Prior Level of Function Shopping  independent    Shopping  Shops independently for small purchases    Prior Level of Function Meal Prep  independent    Meal Prep  Able to complete simple warm meal prep    Prior Level of Function Community Mobility  able still drive, uses walker for longer distances.     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 independently (budgets, writes checks, pays rent, bills goes to bank), collects and keeps track of income      Mobility   Mobility Status  History of falls;Needs assist      Written Expression   Handwriting  Severe micrographia;75% legible      Vision - History   Additional Comments  cataract surgery in Fall 2018      Cognition   Overall Cognitive Status  Within Functional Limits for tasks assessed      Sensation   Light Touch   Appears Intact    Stereognosis  Appears Intact    Hot/Cold  Appears Intact    Proprioception  Appears Intact      Coordination   Gross Motor Movements are Fluid and Coordinated  No    Fine Motor Movements are Fluid and Coordinated  No    9 Hole Peg Test  Right;Left                      OT Education - 02/12/18 2103    Education provided  Yes    Education Details  LSVT BIG, POC, goals    Person(s) Educated  Patient    Methods  Explanation    Comprehension  Verbalized understanding          OT Long Term Goals - 02/12/18 2107      OT LONG TERM GOAL #1   Title  Patient will improve gait speed and endurance and be able to walk 900 feet in 6 minutes to negotiate around the home and community safely in 4 weeks     Baseline  750 at evaluation    Time  4    Period  Weeks    Status  New    Target Date  03/18/18      OT LONG TERM GOAL #2   Title  Patient will complete HEP for maximal daily exercises with modified independence in 4 weeks       Baseline  no current program at eval    Time  4    Period  Weeks    Status  New    Target Date  03/18/18      OT LONG TERM GOAL #3   Title  Patient will transfer from sit to stand without the use of arms safely and independently from a variety of chairs/surfaces in 4 weeks.    Baseline  unable to perform 5 times sit to stand at eval    Time  4    Period  Weeks    Status  New    Target Date  03/18/18      OT LONG TERM  GOAL #4   Title  Patient will decrease frequency of freezing episodes with score of 10 or less on Freezing of Gait questionnaire.       Baseline  eval score of 11    Time  4    Period  Weeks    Status  New    Target Date  03/18/18      OT LONG TERM GOAL #5   Title  Patient will perform handwriting of name with 80% legibility.     Baseline  micrographia and poor legibility at eval    Time  4    Period  Weeks    Status  New    Target Date  03/18/18            Plan - 02/12/18 2104    Clinical  Impression Statement  Patient is a 72 yo female diagnosed with Parkinson's disease and was referred by her physician for LSVT BIG program. Patient presents with decreased step length with gait patterns, decreased reciprocal arm swing, decreased balance, freezing of gait with turns, decreased coordination, and muscle strength which affect her ability to perform daily tasks. The patient is judged to be an excellent candidate for the LSVT BIG program. She would benefit from and was referred for the LSVT BIG program which is an intensive program designed specifically for Parkinson's patients with a focus on increasing amplitude and speed of movements, improving self-care and daily tasks and providing patients with daily exercises to improve overall function. It is recommended that the patient receive the LSVT BIG program which is comprised of 16 intensive sessions (4 times per week for 4 weeks, one hour sessions). Prognosis for improvement is good based on her motivation and family support. LSVT BIG has been documented in the literature as efficacious for individuals with Parkinson's disease.        Occupational performance deficits (Please refer to evaluation for details):  ADL's;IADL's;Leisure    Rehab Potential  Good    OT Frequency  4x / week    OT Duration  4 weeks    OT Treatment/Interventions  Self-care/ADL training;Therapeutic exercise;Gait Training;Moist Heat;Neuromuscular education;Stair Training;Patient/family education;Building services engineer;Therapeutic activities;Balance training;DME and/or AE instruction;Manual Therapy    Clinical Decision Making  Several treatment options, min-mod task modification necessary    Consulted and Agree with Plan of Care  Patient       Patient will benefit from skilled therapeutic intervention in order to improve the following deficits and impairments:  Abnormal gait, Impaired flexibility, Pain, Decreased coordination, Decreased mobility, Decreased activity  tolerance, Decreased endurance, Decreased range of motion, Decreased strength, Decreased balance, Difficulty walking, Impaired UE functional use, Improper body mechanics  Visit Diagnosis: Muscle weakness (generalized)  Difficulty in walking, not elsewhere classified  Other lack of coordination  Unsteadiness on feet    Problem List Patient Active Problem List   Diagnosis Date Noted  . Primary Parkinsonism (HCC) 01/05/2018  . SIADH (syndrome of inappropriate ADH production) (HCC) 10/01/2017  . Spinal stenosis 10/01/2017  . COPD (chronic obstructive pulmonary disease) with chronic bronchitis (HCC) 08/12/2016  . Asthma without status asthmaticus 03/31/2016  . Osteoporosis 03/31/2016  . Fatty liver determined by biopsy 09/17/2015  . Parotid swelling 09/13/2015  . Urticaria 09/13/2015  . Cerebrovascular accident (CVA) (HCC) 08/21/2015  . Senile purpura (HCC) 08/21/2015  . Atrial tachycardia (HCC) 08/13/2015  . Eczema 07/10/2015  . Allergic rhinitis 06/26/2015  . Hyperkalemia 05/03/2015  . Anxiety 04/23/2015  . Edema leg 03/27/2015  . Primary cardiomyopathy (  HCC) 03/05/2015  . Benign essential HTN 02/28/2015  . Combined fat and carbohydrate induced hyperlipemia 09/06/2014  . COPD, severe (HCC) 08/01/2014  . Acid reflux 08/01/2014   Tamiah Dysart T Arne Cleveland, OTR/L, CLT  Lori Brady 02/12/2018, 9:12 PM  [place] [facility] MAIN [facility] SERVICES [address] [place], [place], [address] Phone: [phone]   Fax:  [phone]  Name: Lori Brady MRN: [id number] Date of Birth: January 26, 1946

## 2018-02-14 ENCOUNTER — Ambulatory Visit: Payer: Medicare Other | Attending: Neurology | Admitting: Occupational Therapy

## 2018-02-14 DIAGNOSIS — M6281 Muscle weakness (generalized): Secondary | ICD-10-CM | POA: Diagnosis not present

## 2018-02-14 DIAGNOSIS — R2681 Unsteadiness on feet: Secondary | ICD-10-CM | POA: Diagnosis present

## 2018-02-14 DIAGNOSIS — R262 Difficulty in walking, not elsewhere classified: Secondary | ICD-10-CM | POA: Insufficient documentation

## 2018-02-14 DIAGNOSIS — R278 Other lack of coordination: Secondary | ICD-10-CM | POA: Insufficient documentation

## 2018-02-15 ENCOUNTER — Ambulatory Visit: Payer: Medicare Other | Admitting: Occupational Therapy

## 2018-02-16 ENCOUNTER — Other Ambulatory Visit: Payer: Self-pay | Admitting: Physician Assistant

## 2018-02-16 ENCOUNTER — Ambulatory Visit: Payer: Medicare Other | Admitting: Occupational Therapy

## 2018-02-16 DIAGNOSIS — R278 Other lack of coordination: Secondary | ICD-10-CM

## 2018-02-16 DIAGNOSIS — M6281 Muscle weakness (generalized): Secondary | ICD-10-CM | POA: Diagnosis not present

## 2018-02-16 DIAGNOSIS — F419 Anxiety disorder, unspecified: Secondary | ICD-10-CM

## 2018-02-16 DIAGNOSIS — R262 Difficulty in walking, not elsewhere classified: Secondary | ICD-10-CM

## 2018-02-16 DIAGNOSIS — R2681 Unsteadiness on feet: Secondary | ICD-10-CM

## 2018-02-17 ENCOUNTER — Ambulatory Visit: Payer: Medicare Other | Admitting: Occupational Therapy

## 2018-02-17 DIAGNOSIS — M6281 Muscle weakness (generalized): Secondary | ICD-10-CM | POA: Diagnosis not present

## 2018-02-17 DIAGNOSIS — R278 Other lack of coordination: Secondary | ICD-10-CM

## 2018-02-17 DIAGNOSIS — R262 Difficulty in walking, not elsewhere classified: Secondary | ICD-10-CM

## 2018-02-17 DIAGNOSIS — R2681 Unsteadiness on feet: Secondary | ICD-10-CM

## 2018-02-18 ENCOUNTER — Ambulatory Visit: Payer: Medicare Other | Admitting: Occupational Therapy

## 2018-02-18 DIAGNOSIS — R278 Other lack of coordination: Secondary | ICD-10-CM

## 2018-02-18 DIAGNOSIS — R262 Difficulty in walking, not elsewhere classified: Secondary | ICD-10-CM

## 2018-02-18 DIAGNOSIS — M6281 Muscle weakness (generalized): Secondary | ICD-10-CM | POA: Diagnosis not present

## 2018-02-18 DIAGNOSIS — R2681 Unsteadiness on feet: Secondary | ICD-10-CM

## 2018-02-21 ENCOUNTER — Ambulatory Visit: Payer: Medicare Other | Admitting: Occupational Therapy

## 2018-02-21 DIAGNOSIS — M6281 Muscle weakness (generalized): Secondary | ICD-10-CM | POA: Diagnosis not present

## 2018-02-21 DIAGNOSIS — R278 Other lack of coordination: Secondary | ICD-10-CM

## 2018-02-21 DIAGNOSIS — R2681 Unsteadiness on feet: Secondary | ICD-10-CM

## 2018-02-21 DIAGNOSIS — R262 Difficulty in walking, not elsewhere classified: Secondary | ICD-10-CM

## 2018-02-22 ENCOUNTER — Ambulatory Visit: Payer: Medicare Other | Admitting: Occupational Therapy

## 2018-02-22 ENCOUNTER — Encounter: Payer: Self-pay | Admitting: Occupational Therapy

## 2018-02-22 DIAGNOSIS — R278 Other lack of coordination: Secondary | ICD-10-CM

## 2018-02-22 DIAGNOSIS — M6281 Muscle weakness (generalized): Secondary | ICD-10-CM

## 2018-02-22 DIAGNOSIS — R262 Difficulty in walking, not elsewhere classified: Secondary | ICD-10-CM

## 2018-02-22 DIAGNOSIS — R2681 Unsteadiness on feet: Secondary | ICD-10-CM

## 2018-02-22 NOTE — Therapy (Signed)
[facility] MAIN [facility] SERVICES [address] [place], [place], [address] Phone: [phone]   Fax:  [phone]  Occupational Therapy Treatment  Patient Details  Name: Lori Brady MRN: [id number] Date of Birth: 03-03-1946 Referring Provider: Steele Sizer   Encounter Date: 02/14/2018  OT End of Session - 02/22/18 2122    Visit Number  2    Number of Visits  17    Date for OT Re-Evaluation  03/18/18    OT Start Time  1300    OT Stop Time  1358    OT Time Calculation (min)  58 min    Activity Tolerance  Patient tolerated treatment well    Behavior During Therapy  [facility] for tasks assessed/performed       Past Medical History:  Diagnosis Date  . Arthritis   . Asthma   . Crohn's disease (HCC)   . Emphysema of lung (HCC)   . GERD (gastroesophageal reflux disease)   . Hip fracture (HCC) 01/26/2017  . History of chicken pox   . History of heart attack   . Hx of completed stroke   . Hypercholesteremia   . Hypertension   . Pneumonia   . Seasonal allergies   . Sepsis (HCC)   . Septic shock (HCC) 10/08/2016  . UTI (lower urinary tract infection)     Past Surgical History:  Procedure Laterality Date  . BREAST BIOPSY Bilateral 1960's to 90's  . CATARACT EXTRACTION, BILATERAL      There were no vitals filed for this visit.  Subjective Assessment - 02/22/18 2121    Subjective   Patient reports she is ready to get started. States "my balance is bad"    Pertinent History  Patient reports she fell in the kitchen in March last year and broke her hip.  She was having increased difficulty with movement and decreased balance, she has had 2 injections in her back to help with walking better and she doesn't feel it has helped much. .She was referred to neurology and was diagnosed with Parkinson's in Dec 2018.     Patient Stated Goals  Patient would like to be able to walk more normal, pick up things from the floor, be able to change her bed.     Currently  in Pain?  No/denies    Multiple Pain Sites  No                   OT Treatments/Exercises (OP) - 02/22/18 2125      Neurological Re-education Exercises   Other Exercises 1  Patient seen for initial instruction of LSVT BIG exercises: LSVT Daily Session Maximal Daily Exercises: Sustained movements are designed to rescale the amplitude of movement output for generalization to daily functional activities. Performed as follows for 1 set of 10 repetitions each: Multi directional sustained movements- 1) Floor to ceiling, 2) Side to side. Multi directional Repetitive movements performed in standing and are designed to provide retraining effort needed for sustained muscle activation in tasks Performed as follows: 3) Step and reach forward, 4) Step and Reach Backwards, 5) Step and reach sideways, 6) Rock and reach forward/backward, 7) Rock and reach sideways. Seated exercises required cues and therapist guiding.  For standing exercises, patient required min assist to perform and for balance.  Sit to stand from mat table on lowest setting with cues for weight shift, technique and minimal assist for 10 reps for 1 set.  OT Education - 02/22/18 2121    Education provided  Yes    Education Details  LSVT BIG maximal daily exercises, functional component tasks    Person(s) Educated  Patient    Methods  Explanation;Demonstration;Verbal cues    Comprehension  Verbal cues required;Returned demonstration;Verbalized understanding          OT Long Term Goals - 02/12/18 2107      OT LONG TERM GOAL #1   Title  Patient will improve gait speed and endurance and be able to walk 900 feet in 6 minutes to negotiate around the home and community safely in 4 weeks     Baseline  750 at evaluation    Time  4    Period  Weeks    Status  New    Target Date  03/18/18      OT LONG TERM GOAL #2   Title  Patient will complete HEP for maximal daily exercises with modified independence in 4  weeks       Baseline  no current program at eval    Time  4    Period  Weeks    Status  New    Target Date  03/18/18      OT LONG TERM GOAL #3   Title  Patient will transfer from sit to stand without the use of arms safely and independently from a variety of chairs/surfaces in 4 weeks.    Baseline  unable to perform 5 times sit to stand at eval    Time  4    Period  Weeks    Status  New    Target Date  03/18/18      OT LONG TERM GOAL #4   Title  Patient will decrease frequency of freezing episodes with score of 10 or less on Freezing of Gait questionnaire.       Baseline  eval score of 11    Time  4    Period  Weeks    Status  New    Target Date  03/18/18      OT LONG TERM GOAL #5   Title  Patient will perform handwriting of name with 80% legibility.     Baseline  micrographia and poor legibility at eval    Time  4    Period  Weeks    Status  New    Target Date  03/18/18            Plan - 02/22/18 2122    Clinical Impression Statement  Patient seen for initial instruction of LSVT BIG maximal daily exercises. Patient required moderate verbal and tactile cues along with therapist demonstration and min assist for balance. Discussed formulation of functional component tasks for future sessions. Patient will continue to benefit from instruction of daily exercises to become proficient in performance.     Occupational Profile and client history currently impacting functional performance  balance deficits    Occupational performance deficits (Please refer to evaluation for details):  ADL's;IADL's;Leisure    Rehab Potential  Good    Current Impairments/barriers affecting progress:  positive:  motivation, support/  Negative:  progressive disease process    OT Frequency  4x / week    OT Duration  4 weeks    OT Treatment/Interventions  Self-care/ADL training;Therapeutic exercise;Gait Training;Moist Heat;Neuromuscular education;Stair Training;Patient/family education;TEFL teacher;Therapeutic activities;Balance training;DME and/or AE instruction;Manual Therapy    Consulted and Agree with Plan of Care  Patient  Patient will benefit from skilled therapeutic intervention in order to improve the following deficits and impairments:  Abnormal gait, Impaired flexibility, Pain, Decreased coordination, Decreased mobility, Decreased activity tolerance, Decreased endurance, Decreased range of motion, Decreased strength, Decreased balance, Difficulty walking, Impaired UE functional use, Improper body mechanics  Visit Diagnosis: Muscle weakness (generalized)  Difficulty in walking, not elsewhere classified  Other lack of coordination  Unsteadiness on feet    Problem List Patient Active Problem List   Diagnosis Date Noted  . Primary Parkinsonism (HCC) 01/05/2018  . SIADH (syndrome of inappropriate ADH production) (HCC) 10/01/2017  . Spinal stenosis 10/01/2017  . COPD (chronic obstructive pulmonary disease) with chronic bronchitis (HCC) 08/12/2016  . Asthma without status asthmaticus 03/31/2016  . Osteoporosis 03/31/2016  . Fatty liver determined by biopsy 09/17/2015  . Parotid swelling 09/13/2015  . Urticaria 09/13/2015  . Cerebrovascular accident (CVA) (HCC) 08/21/2015  . Senile purpura (HCC) 08/21/2015  . Atrial tachycardia (HCC) 08/13/2015  . Eczema 07/10/2015  . Allergic rhinitis 06/26/2015  . Hyperkalemia 05/03/2015  . Anxiety 04/23/2015  . Edema leg 03/27/2015  . Primary cardiomyopathy (HCC) 03/05/2015  . Benign essential HTN 02/28/2015  . Combined fat and carbohydrate induced hyperlipemia 09/06/2014  . COPD, severe (HCC) 08/01/2014  . Acid reflux 08/01/2014   Yatzil Clippinger T Arne Cleveland, OTR/L, CLT  Xitlalli Newhard 02/22/2018, 9:25 PM  [facility] MAIN [facility] SERVICES [address] [place], [place], [address] Phone: [phone]   Fax:  [phone]  Name: NASHEKA NJIE MRN: [id number] Date of Birth:  1946-02-24

## 2018-02-22 NOTE — Therapy (Signed)
[facility] MAIN [facility] SERVICES [address] [place], [place], [address] Phone: [phone]   Fax:  [phone]  Occupational Therapy Treatment  Patient Details  Name: Lori Brady MRN: [id number] Date of Birth: 19-Jul-1946 Referring Provider: Steele Sizer   Encounter Date: 02/16/2018  OT End of Session - 02/22/18 2129    Visit Number  3    Number of Visits  17    Date for OT Re-Evaluation  03/18/18    OT Start Time  1301    OT Stop Time  1400    OT Time Calculation (min)  59 min    Activity Tolerance  Patient tolerated treatment well    Behavior During Therapy  [facility] for tasks assessed/performed       Past Medical History:  Diagnosis Date  . Arthritis   . Asthma   . Crohn's disease (HCC)   . Emphysema of lung (HCC)   . GERD (gastroesophageal reflux disease)   . Hip fracture (HCC) 01/26/2017  . History of chicken pox   . History of heart attack   . Hx of completed stroke   . Hypercholesteremia   . Hypertension   . Pneumonia   . Seasonal allergies   . Sepsis (HCC)   . Septic shock (HCC) 10/08/2016  . UTI (lower urinary tract infection)     Past Surgical History:  Procedure Laterality Date  . BREAST BIOPSY Bilateral 1960's to 90's  . CATARACT EXTRACTION, BILATERAL      There were no vitals filed for this visit.  Subjective Assessment - 02/22/18 2127    Subjective   Patient states she is doing well, cannot recall specific exercises from last session. She denies any soreness.     Pertinent History  Patient reports she fell in the kitchen in March last year and broke her hip.  She was having increased difficulty with movement and decreased balance, she has had 2 injections in her back to help with walking better and she doesn't feel it has helped much. .She was referred to neurology and was diagnosed with Parkinson's in Dec 2018.     Patient Stated Goals  Patient would like to be able to walk more normal, pick up things from the floor,  be able to change her bed.     Currently in Pain?  No/denies    Multiple Pain Sites  No                   OT Treatments/Exercises (OP) - 02/22/18 2151      Neurological Re-education Exercises   Other Exercises 1  Patient seen for instruction of LSVT BIG exercises: LSVT Daily Session Maximal Daily Exercises: Sustained movements are designed to rescale the amplitude of movement output for generalization to daily functional activities. Performed as follows for 1 set of 10 repetitions each: Multi directional sustained movements- 1) Floor to ceiling, 2) Side to side. Multi directional Repetitive movements performed in standing and are designed to provide retraining effort needed for sustained muscle activation in tasks Performed as follows: 3) Step and reach forward, 4) Step and Reach Backwards, 5) Step and reach sideways, 6) Rock and reach forward/backward, 7) Rock and reach sideways. Seated exercises required cues and therapist guiding.  For standing exercises, patient required min assist to perform and for balance.  Sit to stand from mat table on lowest setting with cues for weight shift, technique and minimal assist for 10 reps for 1 set.  Other Exercises 2  Added reciprocal toe tapping in standing at the steps, negotiation of 5 stairs, up and down with cues for technique, tends to go up with one pattern and down with another stepping pattern.              OT Education - 02/22/18 2129    Education provided  Yes    Education Details  daily exercises, balance    Person(s) Educated  Patient    Methods  Explanation;Demonstration;Verbal cues    Comprehension  Verbal cues required;Returned demonstration;Verbalized understanding          OT Long Term Goals - 02/12/18 2107      OT LONG TERM GOAL #1   Title  Patient will improve gait speed and endurance and be able to walk 900 feet in 6 minutes to negotiate around the home and community safely in 4 weeks     Baseline  750  at evaluation    Time  4    Period  Weeks    Status  New    Target Date  03/18/18      OT LONG TERM GOAL #2   Title  Patient will complete HEP for maximal daily exercises with modified independence in 4 weeks       Baseline  no current program at eval    Time  4    Period  Weeks    Status  New    Target Date  03/18/18      OT LONG TERM GOAL #3   Title  Patient will transfer from sit to stand without the use of arms safely and independently from a variety of chairs/surfaces in 4 weeks.    Baseline  unable to perform 5 times sit to stand at eval    Time  4    Period  Weeks    Status  New    Target Date  03/18/18      OT LONG TERM GOAL #4   Title  Patient will decrease frequency of freezing episodes with score of 10 or less on Freezing of Gait questionnaire.       Baseline  eval score of 11    Time  4    Period  Weeks    Status  New    Target Date  03/18/18      OT LONG TERM GOAL #5   Title  Patient will perform handwriting of name with 80% legibility.     Baseline  micrographia and poor legibility at eval    Time  4    Period  Weeks    Status  New    Target Date  03/18/18            Plan - 02/22/18 2130    Clinical Impression Statement  Patient continues to require assistance with exercises, therapist demonstration, minimal assist for balance and cues for proper technique.  Patient was unable to recall exercises but familiar with them as they were performed the second treatment session.  Will plan to issue written/pictorial handouts next session and will start to perform exercises twice a day per LSVT BIG protocol.  Patient requires rest breaks at times due to shortness of breath at times.  Continue to work towards goals in POC.     Occupational Profile and client history currently impacting functional performance  balance deficits    Occupational performance deficits (Please refer to evaluation for details):  ADL's;IADL's;Leisure    Rehab Potential  Good  Current  Impairments/barriers affecting progress:  positive:  motivation, support/  Negative:  progressive disease process    OT Frequency  4x / week    OT Duration  4 weeks    OT Treatment/Interventions  Self-care/ADL training;Therapeutic exercise;Gait Training;Moist Heat;Neuromuscular education;Stair Training;Patient/family education;Building services engineer;Therapeutic activities;Balance training;DME and/or AE instruction;Manual Therapy    Consulted and Agree with Plan of Care  Patient       Patient will benefit from skilled therapeutic intervention in order to improve the following deficits and impairments:  Abnormal gait, Impaired flexibility, Pain, Decreased coordination, Decreased mobility, Decreased activity tolerance, Decreased endurance, Decreased range of motion, Decreased strength, Decreased balance, Difficulty walking, Impaired UE functional use, Improper body mechanics  Visit Diagnosis: Muscle weakness (generalized)  Difficulty in walking, not elsewhere classified  Other lack of coordination  Unsteadiness on feet    Problem List Patient Active Problem List   Diagnosis Date Noted  . Primary Parkinsonism (HCC) 01/05/2018  . SIADH (syndrome of inappropriate ADH production) (HCC) 10/01/2017  . Spinal stenosis 10/01/2017  . COPD (chronic obstructive pulmonary disease) with chronic bronchitis (HCC) 08/12/2016  . Asthma without status asthmaticus 03/31/2016  . Osteoporosis 03/31/2016  . Fatty liver determined by biopsy 09/17/2015  . Parotid swelling 09/13/2015  . Urticaria 09/13/2015  . Cerebrovascular accident (CVA) (HCC) 08/21/2015  . Senile purpura (HCC) 08/21/2015  . Atrial tachycardia (HCC) 08/13/2015  . Eczema 07/10/2015  . Allergic rhinitis 06/26/2015  . Hyperkalemia 05/03/2015  . Anxiety 04/23/2015  . Edema leg 03/27/2015  . Primary cardiomyopathy (HCC) 03/05/2015  . Benign essential HTN 02/28/2015  . Combined fat and carbohydrate induced hyperlipemia 09/06/2014   . COPD, severe (HCC) 08/01/2014  . Acid reflux 08/01/2014   Levis Nazir T Arne Cleveland, OTR/L, CLT  Brysan Mcevoy 02/22/2018, 9:53 PM  [facility] MAIN [facility] SERVICES [address] [place], [place], [address] Phone: [phone]   Fax:  [phone]  Name: Lori Brady MRN: [id number] Date of Birth: 11-Nov-1946

## 2018-02-23 ENCOUNTER — Ambulatory Visit: Payer: Medicare Other | Admitting: Occupational Therapy

## 2018-02-23 DIAGNOSIS — R2681 Unsteadiness on feet: Secondary | ICD-10-CM

## 2018-02-23 DIAGNOSIS — M6281 Muscle weakness (generalized): Secondary | ICD-10-CM | POA: Diagnosis not present

## 2018-02-23 DIAGNOSIS — R262 Difficulty in walking, not elsewhere classified: Secondary | ICD-10-CM

## 2018-02-23 DIAGNOSIS — R278 Other lack of coordination: Secondary | ICD-10-CM

## 2018-02-24 ENCOUNTER — Ambulatory Visit: Payer: Medicare Other | Admitting: Occupational Therapy

## 2018-02-24 DIAGNOSIS — M6281 Muscle weakness (generalized): Secondary | ICD-10-CM

## 2018-02-24 DIAGNOSIS — R278 Other lack of coordination: Secondary | ICD-10-CM

## 2018-02-24 DIAGNOSIS — R2681 Unsteadiness on feet: Secondary | ICD-10-CM

## 2018-02-24 DIAGNOSIS — R262 Difficulty in walking, not elsewhere classified: Secondary | ICD-10-CM

## 2018-02-26 ENCOUNTER — Encounter: Payer: Self-pay | Admitting: Occupational Therapy

## 2018-02-26 NOTE — Therapy (Signed)
[place] MAIN [facility] SERVICES [address] [place], [place], [address] Phone: [phone]   Fax:  [phone]  Occupational Therapy Treatment  Patient Details  Name: Lori Brady MRN: [id number] Date of Birth: May 25, 1946 Referring Provider: Joselyn Arrow   Encounter Date: 02/22/2018  OT End of Session - 02/26/18 1502    Visit Number  7    Number of Visits  17    Date for OT Re-Evaluation  03/18/18    OT Start Time  1100    OT Stop Time  1200    OT Time Calculation (min)  60 min    Activity Tolerance  Patient tolerated treatment well    Behavior During Therapy  [facility] for tasks assessed/performed       Past Medical History:  Diagnosis Date  . Arthritis   . Asthma   . Crohn's disease ([place])   . Emphysema of lung ([place])   . GERD (gastroesophageal reflux disease)   . Hip fracture ([place]) 01/26/2017  . History of chicken pox   . History of heart attack   . Hx of completed stroke   . Hypercholesteremia   . Hypertension   . Pneumonia   . Seasonal allergies   . Sepsis ([place])   . Septic shock ([place]) 10/08/2016  . UTI (lower urinary tract infection)     Past Surgical History:  Procedure Laterality Date  . BREAST BIOPSY Bilateral 1960's to 90's  . CATARACT EXTRACTION, BILATERAL      There were no vitals filed for this visit.  Subjective Assessment - 02/26/18 1459    Subjective   Patient reports she is doing well no soreness from exercise and no pain reported.     Pertinent History  Patient reports she fell in the kitchen in March last year and broke her hip.  She was having increased difficulty with movement and decreased balance, she has had 2 injections in her back to help with walking better and she doesn't feel it has helped much. .She was referred to neurology and was diagnosed with Parkinson's in Dec 2018.     Patient Stated Goals  Patient would like to be able to walk more normal, pick up things from the floor, be able to change her bed.      Currently in Pain?  No/denies    Multiple Pain Sites  No                   OT Treatments/Exercises (OP) - 02/26/18 1505      ADLs   ADL Comments  Continued focus on functional component tasks as follows:  sit to stand with cues and occasional assist from mat, handwriting for increased legibility and size, manipulation of small objects with cues for prehension patterns, stepping on and off curbs with CGA and cues, negotiatiing stairs with cues and SBA and use of handrails.  With handwriting, added larger scale Os and loops to work on size of strokes for letter formation.      Neurological Re-education Exercises   Other Exercises 1  Patient seen for instruction of LSVT BIG exercises: LSVT Daily Session Maximal Daily Exercises: Sustained movements are designed to rescale the amplitude of movement output for generalization to daily functional activities. Performed as follows for 1 set of 10 repetitions each: Multi directional sustained movements- 1) Floor to ceiling, 2) Side to side. Multi directional Repetitive movements performed in standing and are designed to provide retraining effort needed for sustained muscle  activation in tasks Performed as follows: 3) Step and reach forward, 4) Step and Reach Backwards, 5) Step and reach sideways, 6) Rock and reach forward/backward, 7) Rock and reach sideways. Seated exercises required cues and therapist guiding.  For standing exercises, patient required CGA assist to perform and for balance.  Sit to stand from mat table on lowest setting with cues for weight shift, technique and minimal assist for 10 reps for 1 set.       Other Exercises 2  Functional mobility for 2 trials of 500 feet with one rest break, cues for size of steps and turns.             OT Education - 02/26/18 1502    Education provided  Yes    Education Details  HEP, handwriting skills, hand flicks    Person(s) Educated  Patient    Methods   Explanation;Demonstration;Verbal cues    Comprehension  Verbal cues required;Returned demonstration;Verbalized understanding          OT Long Term Goals - 02/26/18 1423      OT LONG TERM GOAL #1   Title  Patient will improve gait speed and endurance and be able to walk 900 feet in 6 minutes to negotiate around the home and community safely in 4 weeks     Baseline  750 at evaluation    Time  4    Period  Weeks    Status  On-going      OT LONG TERM GOAL #2   Title  Patient will complete HEP for maximal daily exercises with modified independence in 4 weeks       Baseline  no current program at eval    Time  4    Period  Weeks    Status  On-going      OT LONG TERM GOAL #3   Title  Patient will transfer from sit to stand without the use of arms safely and independently from a variety of chairs/surfaces in 4 weeks.    Baseline  unable to perform 5 times sit to stand at eval    Time  4    Period  Weeks    Status  On-going      OT LONG TERM GOAL #4   Title  Patient will decrease frequency of freezing episodes with score of 10 or less on Freezing of Gait questionnaire.       Baseline  eval score of 11    Time  4    Period  Weeks    Status  On-going      OT LONG TERM GOAL #5   Title  Patient will perform handwriting of name with 80% legibility.     Baseline  micrographia and poor legibility at eval    Time  4    Period  Weeks    Status  On-going            Plan - 02/26/18 1502    Clinical Impression Statement  Patient has continued to make excellent progress with her maximal daily exercises. She benefits from side to side instructions for rock and reach exercise to regulate amplitude of movement and speed. She did not require the use of her walker today and is working words reciprocal arm with functional mobility without an assistive device.  Turns improving during mobility.     Occupational Profile and client history currently impacting functional performance  balance  deficits    Occupational performance deficits (Please refer  to evaluation for details):  ADL's;IADL's;Leisure    Rehab Potential  Good    Current Impairments/barriers affecting progress:  positive:  motivation, support/  Negative:  progressive disease process    OT Frequency  4x / week    OT Duration  4 weeks    OT Treatment/Interventions  Self-care/ADL training;Therapeutic exercise;Gait Training;Moist Heat;Neuromuscular education;Stair Training;Patient/family education;Therapist, nutritional;Therapeutic activities;Balance training;DME and/or AE instruction;Manual Therapy    Consulted and Agree with Plan of Care  Patient       Patient will benefit from skilled therapeutic intervention in order to improve the following deficits and impairments:  Abnormal gait, Impaired flexibility, Pain, Decreased coordination, Decreased mobility, Decreased activity tolerance, Decreased endurance, Decreased range of motion, Decreased strength, Decreased balance, Difficulty walking, Impaired UE functional use, Improper body mechanics  Visit Diagnosis: Muscle weakness (generalized)  Difficulty in walking, not elsewhere classified  Other lack of coordination  Unsteadiness on feet    Problem List Patient Active Problem List   Diagnosis Date Noted  . Primary Parkinsonism ([place]) 01/05/2018  . SIADH (syndrome of inappropriate ADH production) ([place]) 10/01/2017  . Spinal stenosis 10/01/2017  . COPD (chronic obstructive pulmonary disease) with chronic bronchitis ([place]) 08/12/2016  . Asthma without status asthmaticus 03/31/2016  . Osteoporosis 03/31/2016  . Fatty liver determined by biopsy 09/17/2015  . Parotid swelling 09/13/2015  . Urticaria 09/13/2015  . Cerebrovascular accident (CVA) ([place]) 08/21/2015  . Senile purpura ([place]) 08/21/2015  . Atrial tachycardia ([place]) 08/13/2015  . Eczema 07/10/2015  . Allergic rhinitis 06/26/2015  . Hyperkalemia 05/03/2015  . Anxiety 04/23/2015  . Edema leg  03/27/2015  . Primary cardiomyopathy ([place]) 03/05/2015  . Benign essential HTN 02/28/2015  . Combined fat and carbohydrate induced hyperlipemia 09/06/2014  . COPD, severe ([place]) 08/01/2014  . Acid reflux 08/01/2014   Amy T Tomasita Morrow, OTR/L, CLT  Lovett,Amy 02/26/2018, 3:07 PM  [place] MAIN [facility] SERVICES [address] [place], [place], [address] Phone: [phone]   Fax:  [phone]  Name: Lori Brady MRN: [id number] Date of Birth: 10/29/1946

## 2018-02-26 NOTE — Therapy (Signed)
[place] MAIN [facility] SERVICES [address] [place], [place], [address] Phone: [phone]   Fax:  [phone]  Occupational Therapy Treatment  Patient Details  Name: Lori Brady MRN: [id number] Date of Birth: Dec 02, 1945 Referring Provider: Joselyn Arrow   Encounter Date: 02/21/2018  OT End of Session - 02/26/18 1452    Visit Number  6    Number of Visits  17    Date for OT Re-Evaluation  03/18/18    OT Start Time  1245    OT Stop Time  1345    OT Time Calculation (min)  60 min    Activity Tolerance  Patient tolerated treatment well    Behavior During Therapy  [facility] for tasks assessed/performed       Past Medical History:  Diagnosis Date  . Arthritis   . Asthma   . Crohn's disease ([place])   . Emphysema of lung ([place])   . GERD (gastroesophageal reflux disease)   . Hip fracture ([place]) 01/26/2017  . History of chicken pox   . History of heart attack   . Hx of completed stroke   . Hypercholesteremia   . Hypertension   . Pneumonia   . Seasonal allergies   . Sepsis ([place])   . Septic shock ([place]) 10/08/2016  . UTI (lower urinary tract infection)     Past Surgical History:  Procedure Laterality Date  . BREAST BIOPSY Bilateral 1960's to 90's  . CATARACT EXTRACTION, BILATERAL      There were no vitals filed for this visit.  Subjective Assessment - 02/26/18 1450    Subjective   Patient reports she did her exercises over the weekend twice a day and is starting to feel better about her performance.    Pertinent History  Patient reports she fell in the kitchen in March last year and broke her hip.  She was having increased difficulty with movement and decreased balance, she has had 2 injections in her back to help with walking better and she doesn't feel it has helped much. .She was referred to neurology and was diagnosed with Parkinson's in Dec 2018.     Patient Stated Goals  Patient would like to be able to walk more normal, pick up things from the  floor, be able to change her bed.     Currently in Pain?  No/denies    Multiple Pain Sites  No                   OT Treatments/Exercises (OP) - 02/26/18 1455      ADLs   ADL Comments  Functional component tasks established with sit to stand with cues and occasional assist from mat, handwriting for increased legibility and size, manipulation of small objects with cues for prehension patterns, stepping on and off curbs with CGA and cues, negotiatiing stairs with cues and SBA and use of handrails.      Neurological Re-education Exercises   Other Exercises 1  Patient seen for instruction of LSVT BIG exercises: LSVT Daily Session Maximal Daily Exercises: Sustained movements are designed to rescale the amplitude of movement output for generalization to daily functional activities. Performed as follows for 1 set of 10 repetitions each: Multi directional sustained movements- 1) Floor to ceiling, 2) Side to side. Multi directional Repetitive movements performed in standing and are designed to provide retraining effort needed for sustained muscle activation in tasks Performed as follows: 3) Step and reach forward, 4) Step and Reach Backwards,  5) Step and reach sideways, 6) Rock and reach forward/backward, 7) Rock and reach sideways. Seated exercises required cues and therapist guiding.  For standing exercises, patient required CGA assist to perform and for balance.  Sit to stand from mat table on lowest setting with cues for weight shift, technique and minimal assist for 10 reps for 1 set.       Other Exercises 2  Functional mobility for 2 trials of 500 feet with one rest break, cues for size of steps and turns.             OT Education - 02/26/18 1452    Education provided  Yes    Education Details  weight shifting for stepping exercises, turns    Person(s) Educated  Patient    Methods  Explanation;Demonstration;Verbal cues    Comprehension  Verbal cues required;Returned  demonstration;Verbalized understanding          OT Long Term Goals - 02/26/18 1423      OT LONG TERM GOAL #1   Title  Patient will improve gait speed and endurance and be able to walk 900 feet in 6 minutes to negotiate around the home and community safely in 4 weeks     Baseline  750 at evaluation    Time  4    Period  Weeks    Status  On-going      OT LONG TERM GOAL #2   Title  Patient will complete HEP for maximal daily exercises with modified independence in 4 weeks       Baseline  no current program at eval    Time  4    Period  Weeks    Status  On-going      OT LONG TERM GOAL #3   Title  Patient will transfer from sit to stand without the use of arms safely and independently from a variety of chairs/surfaces in 4 weeks.    Baseline  unable to perform 5 times sit to stand at eval    Time  4    Period  Weeks    Status  On-going      OT LONG TERM GOAL #4   Title  Patient will decrease frequency of freezing episodes with score of 10 or less on Freezing of Gait questionnaire.       Baseline  eval score of 11    Time  4    Period  Weeks    Status  On-going      OT LONG TERM GOAL #5   Title  Patient will perform handwriting of name with 80% legibility.     Baseline  micrographia and poor legibility at eval    Time  4    Period  Weeks    Status  On-going            Plan - 02/26/18 1451    Clinical Impression Statement  Improved performance today with exercises however she requires cues to not hold exercises in standing, only holding sitting exercises for a count of 10. Continued focus on rock and reach exercise to coordinate arms with rocking motion, patient tends to perform too quickly and requires cues to slow down and focus on bigger movements. More difficulty noted with right foot with stepping patterns, demos decreased weight shift to right to allow stepping patterns with left. Will emphasize greater amplitude of movement with exercises this week.     Occupational Profile and client history currently impacting functional performance  balance deficits    Occupational performance deficits (Please refer to evaluation for details):  ADL's;IADL's;Leisure    Rehab Potential  Good    Current Impairments/barriers affecting progress:  positive:  motivation, support/  Negative:  progressive disease process    OT Frequency  4x / week    OT Duration  4 weeks    OT Treatment/Interventions  Self-care/ADL training;Therapeutic exercise;Gait Training;Moist Heat;Neuromuscular education;Stair Training;Patient/family education;Therapist, nutritional;Therapeutic activities;Balance training;DME and/or AE instruction;Manual Therapy    Consulted and Agree with Plan of Care  Patient       Patient will benefit from skilled therapeutic intervention in order to improve the following deficits and impairments:  Abnormal gait, Impaired flexibility, Pain, Decreased coordination, Decreased mobility, Decreased activity tolerance, Decreased endurance, Decreased range of motion, Decreased strength, Decreased balance, Difficulty walking, Impaired UE functional use, Improper body mechanics  Visit Diagnosis: Muscle weakness (generalized)  Difficulty in walking, not elsewhere classified  Other lack of coordination  Unsteadiness on feet    Problem List Patient Active Problem List   Diagnosis Date Noted  . Primary Parkinsonism ([place]) 01/05/2018  . SIADH (syndrome of inappropriate ADH production) ([place]) 10/01/2017  . Spinal stenosis 10/01/2017  . COPD (chronic obstructive pulmonary disease) with chronic bronchitis ([place]) 08/12/2016  . Asthma without status asthmaticus 03/31/2016  . Osteoporosis 03/31/2016  . Fatty liver determined by biopsy 09/17/2015  . Parotid swelling 09/13/2015  . Urticaria 09/13/2015  . Cerebrovascular accident (CVA) ([place]) 08/21/2015  . Senile purpura ([place]) 08/21/2015  . Atrial tachycardia ([place]) 08/13/2015  . Eczema 07/10/2015  . Allergic  rhinitis 06/26/2015  . Hyperkalemia 05/03/2015  . Anxiety 04/23/2015  . Edema leg 03/27/2015  . Primary cardiomyopathy ([place]) 03/05/2015  . Benign essential HTN 02/28/2015  . Combined fat and carbohydrate induced hyperlipemia 09/06/2014  . COPD, severe ([place]) 08/01/2014  . Acid reflux 08/01/2014   Amy T Tomasita Morrow, OTR/L, CLT  Lovett,Amy 02/26/2018, 2:57 PM  [place] MAIN [facility] SERVICES [address] [place], [place], [address] Phone: [phone]   Fax:  [phone]  Name: CAMELLIA POPESCU MRN: [id number] Date of Birth: May 07, 1946

## 2018-02-26 NOTE — Therapy (Signed)
[facility] MAIN [facility] SERVICES [address] [place], [place], [address] Phone: [phone]   Fax:  [phone]  Occupational Therapy Treatment  Patient Details  Name: Lori Brady MRN: [id number] Date of Birth: [birth year] Referring Provider: Steele Sizer   Encounter Date: 02/17/2018  OT End of Session - 02/26/18 1412    Visit Number  4    Number of Visits  17    Date for OT Re-Evaluation  03/18/18    OT Start Time  1300    OT Stop Time  1358    OT Time Calculation (min)  58 min    Activity Tolerance  Patient tolerated treatment well    Behavior During Therapy  [name] for tasks assessed/performed       Past Medical History:  Diagnosis Date  . Arthritis   . Asthma   . Crohn's disease (HCC)   . Emphysema of lung (HCC)   . GERD (gastroesophageal reflux disease)   . Hip fracture (HCC) 01/26/2017  . History of chicken pox   . History of heart attack   . Hx of completed stroke   . Hypercholesteremia   . Hypertension   . Pneumonia   . Seasonal allergies   . Sepsis (HCC)   . Septic shock (HCC) 10/08/2016  . UTI (lower urinary tract infection)     Past Surgical History:  Procedure Laterality Date  . BREAST BIOPSY Bilateral 1960's to 90's  . CATARACT EXTRACTION, BILATERAL      There were no vitals filed for this visit.  Subjective Assessment - 02/26/18 1411    Subjective   Patient reports she can remember in the beginning of a couple of exercises but has trouble with remembering the stepping patterns and associated arm movements.     Patient Stated Goals  Patient would like to be able to walk more normal, pick up things from the floor, be able to change her bed.     Currently in Pain?  No/denies    Multiple Pain Sites  No                   OT Treatments/Exercises (OP) - 02/26/18 1416      Neurological Re-education Exercises   Other Exercises 1  Patient seen for instruction of LSVT BIG exercises: LSVT Daily Session Maximal  Daily Exercises: Sustained movements are designed to rescale the amplitude of movement output for generalization to daily functional activities. Performed as follows for 1 set of 10 repetitions each: Multi directional sustained movements- 1) Floor to ceiling, 2) Side to side. Multi directional Repetitive movements performed in standing and are designed to provide retraining effort needed for sustained muscle activation in tasks Performed as follows: 3) Step and reach forward, 4) Step and Reach Backwards, 5) Step and reach sideways, 6) Rock and reach forward/backward, 7) Rock and reach sideways. Seated exercises required cues and therapist guiding.  For standing exercises, patient required min assist to perform and for balance.  Sit to stand from mat table on lowest setting with cues for weight shift, technique and minimal assist for 10 reps for 1 set.       Other Exercises 2  Patient seen with focus on turning behaviors to decrease freezing of gait, instruction on quarter turns and half turns with multiple repetitions and sets completed with verbal cues and CGA to SBA. Functional mobility for 2 trials of 450 feet with cues for reciprocal arm swing and amplitude of steps.  OT Education - 02/26/18 1411    Education provided  Yes    Education Details  HEP, turning behaviors    Person(s) Educated  Patient    Methods  Explanation;Demonstration;Verbal cues    Comprehension  Verbal cues required;Returned demonstration;Verbalized understanding          OT Long Term Goals - 02/12/18 2107      OT LONG TERM GOAL #1   Title  Patient will improve gait speed and endurance and be able to walk 900 feet in 6 minutes to negotiate around the home and community safely in 4 weeks     Baseline  750 at evaluation    Time  4    Period  Weeks    Status  New    Target Date  03/18/18      OT LONG TERM GOAL #2   Title  Patient will complete HEP for maximal daily exercises with modified independence  in 4 weeks       Baseline  no current program at eval    Time  4    Period  Weeks    Status  New    Target Date  03/18/18      OT LONG TERM GOAL #3   Title  Patient will transfer from sit to stand without the use of arms safely and independently from a variety of chairs/surfaces in 4 weeks.    Baseline  unable to perform 5 times sit to stand at eval    Time  4    Period  Weeks    Status  New    Target Date  03/18/18      OT LONG TERM GOAL #4   Title  Patient will decrease frequency of freezing episodes with score of 10 or less on Freezing of Gait questionnaire.       Baseline  eval score of 11    Time  4    Period  Weeks    Status  New    Target Date  03/18/18      OT LONG TERM GOAL #5   Title  Patient will perform handwriting of name with 80% legibility.     Baseline  micrographia and poor legibility at eval    Time  4    Period  Weeks    Status  New    Target Date  03/18/18            Plan - 02/26/18 1413    Clinical Impression Statement  Will plan to issue written/pictorial exercises this date to start with home exercise program. Patient is aware she will need to perform exercises twice a day during the intensive portion of the program. She continues to require verbal and tactile cues for proper technique and has the most difficulty with rock and reach exercise with coordination of reciprocal arms and rocking motion.  Patient see for focus on turning behaviors and the use of quarter turns and  turns rather than her short shuffling steps while turning, multiple trials and repetitions completed with verbal cues and CGA to SBA.    Occupational Profile and client history currently impacting functional performance  balance deficits    Occupational performance deficits (Please refer to evaluation for details):  ADL's;IADL's;Leisure    Rehab Potential  Good    Current Impairments/barriers affecting progress:  positive:  motivation, support/  Negative:  progressive disease  process    OT Frequency  4x / week    OT Duration  4 weeks    OT Treatment/Interventions  Self-care/ADL training;Therapeutic exercise;Gait Training;Moist Heat;Neuromuscular education;Stair Training;Patient/family education;Building services engineer;Therapeutic activities;Balance training;DME and/or AE instruction;Manual Therapy    Consulted and Agree with Plan of Care  Patient       Patient will benefit from skilled therapeutic intervention in order to improve the following deficits and impairments:     Visit Diagnosis: Muscle weakness (generalized)  Difficulty in walking, not elsewhere classified  Other lack of coordination  Unsteadiness on feet    Problem List Patient Active Problem List   Diagnosis Date Noted  . Primary Parkinsonism (HCC) 01/05/2018  . SIADH (syndrome of inappropriate ADH production) (HCC) 10/01/2017  . Spinal stenosis 10/01/2017  . COPD (chronic obstructive pulmonary disease) with chronic bronchitis (HCC) 08/12/2016  . Asthma without status asthmaticus 03/31/2016  . Osteoporosis 03/31/2016  . Fatty liver determined by biopsy 09/17/2015  . Parotid swelling 09/13/2015  . Urticaria 09/13/2015  . Cerebrovascular accident (CVA) (HCC) 08/21/2015  . Senile purpura (HCC) 08/21/2015  . Atrial tachycardia (HCC) 08/13/2015  . Eczema 07/10/2015  . Allergic rhinitis 06/26/2015  . Hyperkalemia 05/03/2015  . Anxiety 04/23/2015  . Edema leg 03/27/2015  . Primary cardiomyopathy (HCC) 03/05/2015  . Benign essential HTN 02/28/2015  . Combined fat and carbohydrate induced hyperlipemia 09/06/2014  . COPD, severe (HCC) 08/01/2014  . Acid reflux 08/01/2014   Juluis Fitzsimmons T Arne Cleveland, OTR/L, CLT  Carolann Brazell 02/26/2018, 2:18 PM  [facility] MAIN [facility] SERVICES [address] [place], [place], [address] Phone: [phone]   Fax:  [phone]  Name: ZANDRIA LAFRANCE MRN: [id number] Date of Birth: November 12, 1946

## 2018-02-26 NOTE — Therapy (Signed)
[place] MAIN [facility] SERVICES [address] [place], [place], [address] Phone: [phone]   Fax:  [phone]  Occupational Therapy Treatment  Patient Details  Name: Lori Brady MRN: [id number] Date of Birth: [birth year] Referring Provider: Joselyn Arrow   Encounter Date: 02/23/2018  OT End of Session - 02/26/18 1512    Visit Number  8    Number of Visits  17    Date for OT Re-Evaluation  03/18/18    OT Start Time  1300    OT Stop Time  1356    OT Time Calculation (min)  56 min    Activity Tolerance  Patient tolerated treatment well    Behavior During Therapy  [facility] for tasks assessed/performed       Past Medical History:  Diagnosis Date  . Arthritis   . Asthma   . Crohn's disease ([place])   . Emphysema of lung ([place])   . GERD (gastroesophageal reflux disease)   . Hip fracture ([place]) 01/26/2017  . History of chicken pox   . History of heart attack   . Hx of completed stroke   . Hypercholesteremia   . Hypertension   . Pneumonia   . Seasonal allergies   . Sepsis ([place])   . Septic shock ([place]) 10/08/2016  . UTI (lower urinary tract infection)     Past Surgical History:  Procedure Laterality Date  . BREAST BIOPSY Bilateral 1960's to 90's  . CATARACT EXTRACTION, BILATERAL      There were no vitals filed for this visit.  Subjective Assessment - 02/26/18 1509    Subjective   Patient reports her family is noticing she is moving better around the house and when they go out into the community.    Pertinent History  Patient reports she fell in the kitchen in March last year and broke her hip.  She was having increased difficulty with movement and decreased balance, she has had 2 injections in her back to help with walking better and she doesn't feel it has helped much. .She was referred to neurology and was diagnosed with Parkinson's in Dec 2018.     Patient Stated Goals  Patient would like to be able to walk more normal, pick up things from the  floor, be able to change her bed.     Currently in Pain?  No/denies    Multiple Pain Sites  No                   OT Treatments/Exercises (OP) - 02/26/18 1513      ADLs   ADL Comments  sit to stand with cues and occasional assist from mat, handwriting for increased legibility and size, manipulation of small objects with cues for prehension patterns, stepping on and off curbs with CGA and cues, negotiatiing stairs with cues and SBA and use of handrails.  With handwriting, added larger scale Os and loops to work on size of strokes for letter formation.  Added sit to stand from bench and chair in lobby.        Neurological Re-education Exercises   Other Exercises 1  Patient seen for instruction of LSVT BIG exercises: LSVT Daily Session Maximal Daily Exercises: Sustained movements are designed to rescale the amplitude of movement output for generalization to daily functional activities. Performed as follows for 1 set of 10 repetitions each: Multi directional sustained movements- 1) Floor to ceiling, 2) Side to side. Multi directional Repetitive movements performed in standing and  are designed to provide retraining effort needed for sustained muscle activation in tasks Performed as follows: 3) Step and reach forward, 4) Step and Reach Backwards, 5) Step and reach sideways, 6) Rock and reach forward/backward, 7) Rock and reach sideways. Seated exercises required cues and therapist guiding.  For standing exercises, patient required CGA assist to perform and for balance.  Sit to stand from mat table on lowest setting with cues for weight shift, technique and CGA assist for 10 reps for 1 set.       Other Exercises 2  Functional mobility without use of AD, SBA for 2 trials of 475 feet, one rest break reqiured, gets short of breath with longer distance ambulation.               OT Education - 02/26/18 1511    Education provided  Yes    Education Details  hand flicks, balance, posture     Person(s) Educated  Patient    Methods  Explanation;Demonstration;Verbal cues    Comprehension  Verbal cues required;Returned demonstration;Verbalized understanding          OT Long Term Goals - 02/26/18 1423      OT LONG TERM GOAL #1   Title  Patient will improve gait speed and endurance and be able to walk 900 feet in 6 minutes to negotiate around the home and community safely in 4 weeks     Baseline  750 at evaluation    Time  4    Period  Weeks    Status  On-going      OT LONG TERM GOAL #2   Title  Patient will complete HEP for maximal daily exercises with modified independence in 4 weeks       Baseline  no current program at eval    Time  4    Period  Weeks    Status  On-going      OT LONG TERM GOAL #3   Title  Patient will transfer from sit to stand without the use of arms safely and independently from a variety of chairs/surfaces in 4 weeks.    Baseline  unable to perform 5 times sit to stand at eval    Time  4    Period  Weeks    Status  On-going      OT LONG TERM GOAL #4   Title  Patient will decrease frequency of freezing episodes with score of 10 or less on Freezing of Gait questionnaire.       Baseline  eval score of 11    Time  4    Period  Weeks    Status  On-going      OT LONG TERM GOAL #5   Title  Patient will perform handwriting of name with 80% legibility.     Baseline  micrographia and poor legibility at eval    Time  4    Period  Weeks    Status  On-going            Plan - 02/26/18 1509    Clinical Impression Statement  Patient is requiring decreased cues for sequencing of exercises and for associated arm movements. She has become more consistent with completing exercises at home and uses her handouts as a guide at home.  She is demonstrating progress with size of letter formation with handwriting and has added hand flicks prior to writing and working on this at home as a part of her home program.  Continued progress in all areas.       Occupational Profile and client history currently impacting functional performance  balance deficits    Occupational performance deficits (Please refer to evaluation for details):  ADL's;IADL's;Leisure    Rehab Potential  Good    Current Impairments/barriers affecting progress:  positive:  motivation, support/  Negative:  progressive disease process    OT Frequency  4x / week    OT Duration  4 weeks    OT Treatment/Interventions  Self-care/ADL training;Therapeutic exercise;Gait Training;Moist Heat;Neuromuscular education;Stair Training;Patient/family education;Therapist, nutritional;Therapeutic activities;Balance training;DME and/or AE instruction;Manual Therapy    Consulted and Agree with Plan of Care  Patient       Patient will benefit from skilled therapeutic intervention in order to improve the following deficits and impairments:  Abnormal gait, Impaired flexibility, Pain, Decreased coordination, Decreased mobility, Decreased activity tolerance, Decreased endurance, Decreased range of motion, Decreased strength, Decreased balance, Difficulty walking, Impaired UE functional use, Improper body mechanics  Visit Diagnosis: Muscle weakness (generalized)  Difficulty in walking, not elsewhere classified  Other lack of coordination  Unsteadiness on feet    Problem List Patient Active Problem List   Diagnosis Date Noted  . Primary Parkinsonism ([place]) 01/05/2018  . SIADH (syndrome of inappropriate ADH production) ([place]) 10/01/2017  . Spinal stenosis 10/01/2017  . COPD (chronic obstructive pulmonary disease) with chronic bronchitis ([place]) 08/12/2016  . Asthma without status asthmaticus 03/31/2016  . Osteoporosis 03/31/2016  . Fatty liver determined by biopsy 09/17/2015  . Parotid swelling 09/13/2015  . Urticaria 09/13/2015  . Cerebrovascular accident (CVA) ([place]) 08/21/2015  . Senile purpura ([place]) 08/21/2015  . Atrial tachycardia ([place]) 08/13/2015  . Eczema 07/10/2015  . Allergic  rhinitis 06/26/2015  . Hyperkalemia 05/03/2015  . Anxiety 04/23/2015  . Edema leg 03/27/2015  . Primary cardiomyopathy ([place]) 03/05/2015  . Benign essential HTN 02/28/2015  . Combined fat and carbohydrate induced hyperlipemia 09/06/2014  . COPD, severe ([place]) 08/01/2014  . Acid reflux 08/01/2014   Amy T Tomasita Morrow, OTR/L, CLT  Lovett,Amy 02/26/2018, 3:15 PM  [place] MAIN [facility] SERVICES [address] [place], [place], [address] Phone: [phone]   Fax:  [phone]  Name: Lori Brady MRN: [id number] Date of Birth: 1946/08/21

## 2018-02-26 NOTE — Therapy (Signed)
[facility] MAIN [facility] SERVICES [address] [place], [place], [address] Phone: [phone]   Fax:  [phone]  Occupational Therapy Treatment  Patient Details  Name: Lori Brady MRN: [id number] Date of Birth: [birth year] Referring Provider: Steele Sizer   Encounter Date: 02/18/2018  OT End of Session - 02/26/18 1422    Visit Number  5    Number of Visits  17    Date for OT Re-Evaluation  03/18/18    OT Start Time  1245    OT Stop Time  1344    OT Time Calculation (min)  59 min    Activity Tolerance  Patient tolerated treatment well    Behavior During Therapy  [facility] for tasks assessed/performed       Past Medical History:  Diagnosis Date  . Arthritis   . Asthma   . Crohn's disease (HCC)   . Emphysema of lung (HCC)   . GERD (gastroesophageal reflux disease)   . Hip fracture (HCC) 01/26/2017  . History of chicken pox   . History of heart attack   . Hx of completed stroke   . Hypercholesteremia   . Hypertension   . Pneumonia   . Seasonal allergies   . Sepsis (HCC)   . Septic shock (HCC) 10/08/2016  . UTI (lower urinary tract infection)     Past Surgical History:  Procedure Laterality Date  . BREAST BIOPSY Bilateral 1960's to 90's  . CATARACT EXTRACTION, BILATERAL      There were no vitals filed for this visit.  Subjective Assessment - 02/26/18 1419    Subjective   Patient reports she studied the exercises and handouts last night and did better with many of the exercises. She states I still have the most trouble with the rocking exercise.    Pertinent History  Patient reports she fell in the kitchen in March last year and broke her hip.  She was having increased difficulty with movement and decreased balance, she has had 2 injections in her back to help with walking better and she doesn't feel it has helped much. .She was referred to neurology and was diagnosed with Parkinson's in Dec 2018.     Patient Stated Goals  Patient would like  to be able to walk more normal, pick up things from the floor, be able to change her bed.     Currently in Pain?  No/denies    Multiple Pain Sites  No                   OT Treatments/Exercises (OP) - 02/26/18 1424      ADLs   ADL Comments  Functional component tasks established with sit to stand with cues and occasional assist from mat, handwriting for increased legibility and size, manipulation of small objects with cues for prehension patterns, stepping on and off curbs with CGA and cues, negotiatiing stairs with cues and SBA and use of handrails.      Neurological Re-education Exercises   Other Exercises 1  Patient seen for instruction of LSVT BIG exercises: LSVT Daily Session Maximal Daily Exercises: Sustained movements are designed to rescale the amplitude of movement output for generalization to daily functional activities. Performed as follows for 1 set of 10 repetitions each: Multi directional sustained movements- 1) Floor to ceiling, 2) Side to side. Multi directional Repetitive movements performed in standing and are designed to provide retraining effort needed for sustained muscle activation in tasks Performed as follows: 3)  Step and reach forward, 4) Step and Reach Backwards, 5) Step and reach sideways, 6) Rock and reach forward/backward, 7) Rock and reach sideways. Seated exercises required cues and therapist guiding.  For standing exercises, patient required min assist to perform and for balance.  Sit to stand from mat table on lowest setting with cues for weight shift, technique and minimal assist for 10 reps for 1 set.       Other Exercises 2  Functional mobility skills without the use of an assistive device for 2 sets of 450 feet with SBA and cues for turns, reciprocal arm swing, and size of steps.              OT Education - 02/26/18 1427    Education provided  Yes    Education Details  functional component tasks, home exercises.          OT Long Term  Goals - 02/26/18 1423      OT LONG TERM GOAL #1   Title  Patient will improve gait speed and endurance and be able to walk 900 feet in 6 minutes to negotiate around the home and community safely in 4 weeks     Baseline  750 at evaluation    Time  4    Period  Weeks    Status  On-going      OT LONG TERM GOAL #2   Title  Patient will complete HEP for maximal daily exercises with modified independence in 4 weeks       Baseline  no current program at eval    Time  4    Period  Weeks    Status  On-going      OT LONG TERM GOAL #3   Title  Patient will transfer from sit to stand without the use of arms safely and independently from a variety of chairs/surfaces in 4 weeks.    Baseline  unable to perform 5 times sit to stand at eval    Time  4    Period  Weeks    Status  On-going      OT LONG TERM GOAL #4   Title  Patient will decrease frequency of freezing episodes with score of 10 or less on Freezing of Gait questionnaire.       Baseline  eval score of 11    Time  4    Period  Weeks    Status  On-going      OT LONG TERM GOAL #5   Title  Patient will perform handwriting of name with 80% legibility.     Baseline  micrographia and poor legibility at eval    Time  4    Period  Weeks    Status  On-going            Plan - 02/26/18 1422    Clinical Impression Statement  Patient is becoming more familiar with exercises after performing each day this week and after adding to home program. She continues to require occasional assist for balance and requires verbal cues, contact guard assistance for tasks in standing. For the rock and reach exercise she performs best with therapist demonstrating side-by-side during exercise. She continues to benefit from intensive LSVT program to impact balance, functional mobility, strength, and range of motion.    Occupational Profile and client history currently impacting functional performance  balance deficits    Occupational performance deficits  (Please refer to evaluation for details):  ADL's;IADL's;Leisure  Rehab Potential  Good    Current Impairments/barriers affecting progress:  positive:  motivation, support/  Negative:  progressive disease process    OT Frequency  4x / week    OT Duration  4 weeks    OT Treatment/Interventions  Self-care/ADL training;Therapeutic exercise;Gait Training;Moist Heat;Neuromuscular education;Stair Training;Patient/family education;Building services engineer;Therapeutic activities;Balance training;DME and/or AE instruction;Manual Therapy    Consulted and Agree with Plan of Care  Patient       Patient will benefit from skilled therapeutic intervention in order to improve the following deficits and impairments:     Visit Diagnosis: Muscle weakness (generalized)  Difficulty in walking, not elsewhere classified  Other lack of coordination  Unsteadiness on feet    Problem List Patient Active Problem List   Diagnosis Date Noted  . Primary Parkinsonism (HCC) 01/05/2018  . SIADH (syndrome of inappropriate ADH production) (HCC) 10/01/2017  . Spinal stenosis 10/01/2017  . COPD (chronic obstructive pulmonary disease) with chronic bronchitis (HCC) 08/12/2016  . Asthma without status asthmaticus 03/31/2016  . Osteoporosis 03/31/2016  . Fatty liver determined by biopsy 09/17/2015  . Parotid swelling 09/13/2015  . Urticaria 09/13/2015  . Cerebrovascular accident (CVA) (HCC) 08/21/2015  . Senile purpura (HCC) 08/21/2015  . Atrial tachycardia (HCC) 08/13/2015  . Eczema 07/10/2015  . Allergic rhinitis 06/26/2015  . Hyperkalemia 05/03/2015  . Anxiety 04/23/2015  . Edema leg 03/27/2015  . Primary cardiomyopathy (HCC) 03/05/2015  . Benign essential HTN 02/28/2015  . Combined fat and carbohydrate induced hyperlipemia 09/06/2014  . COPD, severe (HCC) 08/01/2014  . Acid reflux 08/01/2014   Eilee Schader T Arne Cleveland, OTR/L, CLT  Axelle Szwed 02/26/2018, 2:27 PM  [facility]  MAIN [facility] SERVICES [address] [place], [place], [address] Phone: [phone]   Fax:  [phone]  Name: Lori Brady MRN: [id number] Date of Birth: 06/17/1946

## 2018-02-26 NOTE — Therapy (Signed)
[place] MAIN [facility] SERVICES [address] [place], [place], [address] Phone: [phone]   Fax:  [phone]  Occupational Therapy Treatment  Patient Details  Name: Lori Brady MRN: [id number] Date of Birth: 11-20-1945 Referring Provider: Joselyn Arrow   Encounter Date: 02/24/2018  OT End of Session - 02/26/18 1517    Visit Number  9    Number of Visits  17    Date for OT Re-Evaluation  03/18/18    OT Start Time  2297    OT Stop Time  1355    OT Time Calculation (min)  57 min    Activity Tolerance  Patient tolerated treatment well    Behavior During Therapy  [facility] for tasks assessed/performed       Past Medical History:  Diagnosis Date  . Arthritis   . Asthma   . Crohn's disease ([place])   . Emphysema of lung ([place])   . GERD (gastroesophageal reflux disease)   . Hip fracture ([place]) 01/26/2017  . History of chicken pox   . History of heart attack   . Hx of completed stroke   . Hypercholesteremia   . Hypertension   . Pneumonia   . Seasonal allergies   . Sepsis ([place])   . Septic shock ([place]) 10/08/2016  . UTI (lower urinary tract infection)     Past Surgical History:  Procedure Laterality Date  . BREAST BIOPSY Bilateral 1960's to 90's  . CATARACT EXTRACTION, BILATERAL      There were no vitals filed for this visit.  Subjective Assessment - 02/26/18 1517    Subjective   Patient's sister Present and reports she can see a big difference in the patient's mobility this week. Patient is pleased with her progress    Pertinent History  Patient reports she fell in the kitchen in March last year and broke her hip.  She was having increased difficulty with movement and decreased balance, she has had 2 injections in her back to help with walking better and she doesn't feel it has helped much. .She was referred to neurology and was diagnosed with Parkinson's in Dec 2018.     Patient Stated Goals  Patient would like to be able to walk more normal,  pick up things from the floor, be able to change her bed.     Multiple Pain Sites  No                   OT Treatments/Exercises (OP) - 02/26/18 1520      ADLs   ADL Comments  Functional component tasks established with sit to stand with cues and occasional assist from mat, handwriting for increased legibility and size, manipulation of small objects with cues for prehension patterns, stepping on and off curbs with CGA and cues, negotiatiing stairs with cues and SBA and use of handrails.      Neurological Re-education Exercises   Other Exercises 1  Patient seen for instruction of LSVT BIG exercises: LSVT Daily Session Maximal Daily Exercises: Sustained movements are designed to rescale the amplitude of movement output for generalization to daily functional activities. Performed as follows for 1 set of 10 repetitions each: Multi directional sustained movements- 1) Floor to ceiling, 2) Side to side. Multi directional Repetitive movements performed in standing and are designed to provide retraining effort needed for sustained muscle activation in tasks Performed as follows: 3) Step and reach forward, 4) Step and Reach Backwards, 5) Step and reach sideways, 6)  Rock and reach forward/backward, 7) Rock and reach sideways. Seated exercises required cues and therapist guiding.  For standing exercises, patient required CGA assist to perform and for balance.  Sit to stand from mat table on lowest setting with cues for weight shift, technique and CGA assist for 10 reps for 1 set.       Other Exercises 2  Functional mobility without use of AD, SBA for 2 trials of 500 feet, one rest break reqiured, gets short of breath with longer distance ambulation.               OT Education - 02/26/18 1519    Education provided  Yes    Education Details  HEP    Person(s) Educated  Patient    Methods  Explanation;Demonstration;Verbal cues    Comprehension  Verbal cues required;Returned  demonstration;Verbalized understanding          OT Long Term Goals - 02/26/18 1423      OT LONG TERM GOAL #1   Title  Patient will improve gait speed and endurance and be able to walk 900 feet in 6 minutes to negotiate around the home and community safely in 4 weeks     Baseline  750 at evaluation    Time  4    Period  Weeks    Status  On-going      OT LONG TERM GOAL #2   Title  Patient will complete HEP for maximal daily exercises with modified independence in 4 weeks       Baseline  no current program at eval    Time  4    Period  Weeks    Status  On-going      OT LONG TERM GOAL #3   Title  Patient will transfer from sit to stand without the use of arms safely and independently from a variety of chairs/surfaces in 4 weeks.    Baseline  unable to perform 5 times sit to stand at eval    Time  4    Period  Weeks    Status  On-going      OT LONG TERM GOAL #4   Title  Patient will decrease frequency of freezing episodes with score of 10 or less on Freezing of Gait questionnaire.       Baseline  eval score of 11    Time  4    Period  Weeks    Status  On-going      OT LONG TERM GOAL #5   Title  Patient will perform handwriting of name with 80% legibility.     Baseline  micrographia and poor legibility at eval    Time  4    Period  Weeks    Status  On-going            Plan - 02/26/18 1517    Clinical Impression Statement  Patient continues to make consistent progress towards her goals. She is demonstrating improved quality of movement with maximal daily exercises as well as during functional mobility and is completing without the use of an assistive device. She has improved her amplitude of gait and ability to ambulate further distances without multiple rest breaks. She reports she is very happy with her progress and feels she is doing much better overall. She continues to work towards turning behaviors with use of quarter turns and half turns with increased coronation  and smoothness of movement. Will plan to re-assess her outcome measures next week.  Continue to challenge patient and work towards increasing complexities of exercises and functional ability with increased distractions.    Occupational Profile and client history currently impacting functional performance  balance deficits    Occupational performance deficits (Please refer to evaluation for details):  ADL's;IADL's;Leisure    Rehab Potential  Good       Patient will benefit from skilled therapeutic intervention in order to improve the following deficits and impairments:     Visit Diagnosis: Muscle weakness (generalized)  Difficulty in walking, not elsewhere classified  Other lack of coordination  Unsteadiness on feet    Problem List Patient Active Problem List   Diagnosis Date Noted  . Primary Parkinsonism ([place]) 01/05/2018  . SIADH (syndrome of inappropriate ADH production) ([place]) 10/01/2017  . Spinal stenosis 10/01/2017  . COPD (chronic obstructive pulmonary disease) with chronic bronchitis ([place]) 08/12/2016  . Asthma without status asthmaticus 03/31/2016  . Osteoporosis 03/31/2016  . Fatty liver determined by biopsy 09/17/2015  . Parotid swelling 09/13/2015  . Urticaria 09/13/2015  . Cerebrovascular accident (CVA) ([place]) 08/21/2015  . Senile purpura ([place]) 08/21/2015  . Atrial tachycardia ([place]) 08/13/2015  . Eczema 07/10/2015  . Allergic rhinitis 06/26/2015  . Hyperkalemia 05/03/2015  . Anxiety 04/23/2015  . Edema leg 03/27/2015  . Primary cardiomyopathy ([place]) 03/05/2015  . Benign essential HTN 02/28/2015  . Combined fat and carbohydrate induced hyperlipemia 09/06/2014  . COPD, severe ([place]) 08/01/2014  . Acid reflux 08/01/2014   Claudette Wermuth T Tomasita Morrow, OTR/L, CLT  Manuelito Poage 02/26/2018, 3:21 PM  [place] MAIN [facility] SERVICES [address] [place], [place], [address] Phone: [phone]   Fax:  [phone]  Name: Lori Brady MRN:  [id number] Date of Birth: 08-06-1946

## 2018-02-28 ENCOUNTER — Ambulatory Visit: Payer: Medicare Other | Admitting: Occupational Therapy

## 2018-02-28 DIAGNOSIS — R278 Other lack of coordination: Secondary | ICD-10-CM

## 2018-02-28 DIAGNOSIS — R2681 Unsteadiness on feet: Secondary | ICD-10-CM

## 2018-02-28 DIAGNOSIS — R262 Difficulty in walking, not elsewhere classified: Secondary | ICD-10-CM

## 2018-02-28 DIAGNOSIS — M6281 Muscle weakness (generalized): Secondary | ICD-10-CM

## 2018-03-01 ENCOUNTER — Ambulatory Visit: Payer: Medicare Other | Admitting: Occupational Therapy

## 2018-03-01 DIAGNOSIS — R2681 Unsteadiness on feet: Secondary | ICD-10-CM

## 2018-03-01 DIAGNOSIS — R262 Difficulty in walking, not elsewhere classified: Secondary | ICD-10-CM

## 2018-03-01 DIAGNOSIS — M6281 Muscle weakness (generalized): Secondary | ICD-10-CM

## 2018-03-01 DIAGNOSIS — R278 Other lack of coordination: Secondary | ICD-10-CM

## 2018-03-02 ENCOUNTER — Ambulatory Visit: Payer: Medicare Other | Admitting: Occupational Therapy

## 2018-03-02 ENCOUNTER — Encounter: Payer: Self-pay | Admitting: Occupational Therapy

## 2018-03-02 DIAGNOSIS — M6281 Muscle weakness (generalized): Secondary | ICD-10-CM

## 2018-03-02 DIAGNOSIS — R262 Difficulty in walking, not elsewhere classified: Secondary | ICD-10-CM

## 2018-03-02 DIAGNOSIS — R278 Other lack of coordination: Secondary | ICD-10-CM

## 2018-03-02 DIAGNOSIS — R2681 Unsteadiness on feet: Secondary | ICD-10-CM

## 2018-03-03 ENCOUNTER — Ambulatory Visit: Payer: Medicare Other | Admitting: Occupational Therapy

## 2018-03-03 DIAGNOSIS — R2681 Unsteadiness on feet: Secondary | ICD-10-CM

## 2018-03-03 DIAGNOSIS — M6281 Muscle weakness (generalized): Secondary | ICD-10-CM | POA: Diagnosis not present

## 2018-03-03 DIAGNOSIS — R278 Other lack of coordination: Secondary | ICD-10-CM

## 2018-03-03 DIAGNOSIS — R262 Difficulty in walking, not elsewhere classified: Secondary | ICD-10-CM

## 2018-03-05 ENCOUNTER — Encounter: Payer: Self-pay | Admitting: Occupational Therapy

## 2018-03-05 NOTE — Therapy (Signed)
[place] MAIN [facility] SERVICES [address] [place], [place], [address] Phone: [phone]   Fax:  [phone]  Occupational Therapy Treatment  Patient Details  Name: Lori Brady MRN: [id number] Date of Birth: 1946-08-13 Referring Provider: Steele Sizer   Encounter Date: 03/03/2018  OT End of Session - 03/05/18 1806    Visit Number  13    Number of Visits  17    Date for OT Re-Evaluation  03/18/18    Authorization Type  medicare    OT Start Time  1300    OT Stop Time  1400    OT Time Calculation (min)  60 min    Activity Tolerance  Patient tolerated treatment well    Behavior During Therapy  [facility] for tasks assessed/performed       Past Medical History:  Diagnosis Date  . Arthritis   . Asthma   . Crohn's disease ([facility])   . Emphysema of lung ([facility])   . GERD (gastroesophageal reflux disease)   . Hip fracture ([facility]) 01/26/2017  . History of chicken pox   . History of heart attack   . Hx of completed stroke   . Hypercholesteremia   . Hypertension   . Pneumonia   . Seasonal allergies   . Sepsis ([facility])   . Septic shock ([facility]) 10/08/2016  . UTI (lower urinary tract infection)     Past Surgical History:  Procedure Laterality Date  . BREAST BIOPSY Bilateral 1960's to 90's  . CATARACT EXTRACTION, BILATERAL      There were no vitals filed for this visit.  Subjective Assessment - 03/05/18 1805    Subjective   No pain, went for birthday dinner with sister yesterday and was supposed to go to [employer] afterwards, she was so tired from exercise and lunch that she sat in the car.   Still has decreased endurance to activity.    Pertinent History  Patient reports she fell in the kitchen in March last year and broke her hip.  She was having increased difficulty with movement and decreased balance, she has had 2 injections in her back to help with walking better and she doesn't feel it has helped much. .She was referred to neurology and was diagnosed with  Parkinson's in Dec 2018.     Patient Stated Goals  Patient would like to be able to walk more normal, pick up things from the floor, be able to change her bed.     Currently in Pain?  No/denies    Multiple Pain Sites  No                   OT Treatments/Exercises (OP) - 03/05/18 1807      ADLs   ADL Comments  Manipulation of pieces of [place] with dowels, washers and collars with cues for prehension patterns, translatory movements of the hand to move items from fingertips to palm and back, using hand for storage. Difficulty with picking up small collar pieces. Handwriting with formulation of list of 10 items, 90% legibility      Neurological Re-education Exercises   Other Exercises 1  Patient seen for instruction of LSVT BIG exercises: LSVT Daily Session Maximal Daily Exercises: Sustained movements are designed to rescale the amplitude of movement output for generalization to daily functional activities. Performed as follows for 1 set of 10 repetitions each: Multi directional sustained movements- 1) Floor to ceiling, 2) Side to side. Multi directional Repetitive movements performed in standing and  are designed to provide retraining effort needed for sustained muscle activation in tasks Performed as follows: 3) Step and reach forward, 4) Step and Reach Backwards, 5) Step and reach sideways, 6) Rock and reach forward/backward, 7) Rock and reach sideways. For standing exercises, patient required CGA to perform and for balance and exercises performed in a standard technique in the clinic with therapist assist.  Sit to stand from mat table on lowest setting with cues for weight shift, technique and CGA for 10 reps for 1 set, reciprocal stepping patterns with use of one handrail for balance.  Continued with Advanced complexity of exercises to alternating sides with maximal daily exercises.      Other Exercises 2  Functional mobility skills for 2 trials of 500 feet with cues for amplitude of steps  and reciprocal arm swing, focused on turns in smaller spaces this date.               OT Education - 03/05/18 1806    Education provided  Yes    Education Details  FMC, manipulation of small items, focus on turns    Person(s) Educated  Patient    Methods  Explanation;Demonstration;Verbal cues    Comprehension  Verbal cues required;Returned demonstration;Verbalized understanding          OT Long Term Goals - 02/26/18 1423      OT LONG TERM GOAL #1   Title  Patient will improve gait speed and endurance and be able to walk 900 feet in 6 minutes to negotiate around the home and community safely in 4 weeks     Baseline  750 at evaluation    Time  4    Period  Weeks    Status  On-going      OT LONG TERM GOAL #2   Title  Patient will complete HEP for maximal daily exercises with modified independence in 4 weeks       Baseline  no current program at eval    Time  4    Period  Weeks    Status  On-going      OT LONG TERM GOAL #3   Title  Patient will transfer from sit to stand without the use of arms safely and independently from a variety of chairs/surfaces in 4 weeks.    Baseline  unable to perform 5 times sit to stand at eval    Time  4    Period  Weeks    Status  On-going      OT LONG TERM GOAL #4   Title  Patient will decrease frequency of freezing episodes with score of 10 or less on Freezing of Gait questionnaire.       Baseline  eval score of 11    Time  4    Period  Weeks    Status  On-going      OT LONG TERM GOAL #5   Title  Patient will perform handwriting of name with 80% legibility.     Baseline  micrographia and poor legibility at eval    Time  4    Period  Weeks    Status  On-going            Plan - 03/05/18 1806    Clinical Impression Statement  Patient continues to progress in all areas, she has been ambulating now without an assistive device and for longer distances.  Balance improving with exercises and mobility skills, occasional difficulty  at times with turns  and requires cues for size of steps.  Patient is consistent with performing exercises daily, continue to work towards increased complexity and add distractions and work towards calibration of movements.     Occupational Profile and client history currently impacting functional performance  balance deficits    Occupational performance deficits (Please refer to evaluation for details):  ADL's;IADL's;Leisure    Rehab Potential  Good    Current Impairments/barriers affecting progress:  positive:  motivation, support/  Negative:  progressive disease process    OT Frequency  4x / week    OT Duration  4 weeks    OT Treatment/Interventions  Self-care/ADL training;Therapeutic exercise;Gait Training;Moist Heat;Neuromuscular education;Stair Training;Patient/family education;Building services engineer;Therapeutic activities;Balance training;DME and/or AE instruction;Manual Therapy    Consulted and Agree with Plan of Care  Patient       Patient will benefit from skilled therapeutic intervention in order to improve the following deficits and impairments:  Abnormal gait, Impaired flexibility, Pain, Decreased coordination, Decreased mobility, Decreased activity tolerance, Decreased endurance, Decreased range of motion, Decreased strength, Decreased balance, Difficulty walking, Impaired UE functional use, Improper body mechanics  Visit Diagnosis: Difficulty in walking, not elsewhere classified  Muscle weakness (generalized)  Other lack of coordination  Unsteadiness on feet    Problem List Patient Active Problem List   Diagnosis Date Noted  . Primary Parkinsonism ([facility]) 01/05/2018  . SIADH (syndrome of inappropriate ADH production) ([facility]) 10/01/2017  . Spinal stenosis 10/01/2017  . COPD (chronic obstructive pulmonary disease) with chronic bronchitis ([facility]) 08/12/2016  . Asthma without status asthmaticus 03/31/2016  . Osteoporosis 03/31/2016  . Fatty liver determined by biopsy  09/17/2015  . Parotid swelling 09/13/2015  . Urticaria 09/13/2015  . Cerebrovascular accident (CVA) ([facility]) 08/21/2015  . Senile purpura ([facility]) 08/21/2015  . Atrial tachycardia ([facility]) 08/13/2015  . Eczema 07/10/2015  . Allergic rhinitis 06/26/2015  . Hyperkalemia 05/03/2015  . Anxiety 04/23/2015  . Edema leg 03/27/2015  . Primary cardiomyopathy ([facility]) 03/05/2015  . Benign essential HTN 02/28/2015  . Combined fat and carbohydrate induced hyperlipemia 09/06/2014  . COPD, severe ([facility]) 08/01/2014  . Acid reflux 08/01/2014   Kerrie Buffalo, OTR/L, CLT  Stepanie Graver 03/05/2018, 6:11 PM  [place] [facility] MAIN [facility] SERVICES [address] [place], [place], [address] Phone: [phone]   Fax:  [phone]  Name: Lori Brady MRN: [id number] Date of Birth: 1946-07-26

## 2018-03-05 NOTE — Therapy (Signed)
[facility] MAIN [facility] SERVICES [address] [place], [place], [address] Phone: [phone]   Fax:  [phone]  Occupational Therapy Treatment  Patient Details  Name: Lori Brady MRN: [id number] Date of Birth: 07-15-1946 Referring Provider: Steele Sizer   Encounter Date: 03/02/2018  OT End of Session - 03/05/18 1801    Visit Number  12    Number of Visits  17    Date for OT Re-Evaluation  03/18/18    Authorization Type  medicare    OT Start Time  1301    OT Stop Time  1400    OT Time Calculation (min)  59 min    Activity Tolerance  Patient tolerated treatment well    Behavior During Therapy  [facility] for tasks assessed/performed       Past Medical History:  Diagnosis Date  . Arthritis   . Asthma   . Crohn's disease (HCC)   . Emphysema of lung (HCC)   . GERD (gastroesophageal reflux disease)   . Hip fracture (HCC) 01/26/2017  . History of chicken pox   . History of heart attack   . Hx of completed stroke   . Hypercholesteremia   . Hypertension   . Pneumonia   . Seasonal allergies   . Sepsis (HCC)   . Septic shock (HCC) 10/08/2016  . UTI (lower urinary tract infection)     Past Surgical History:  Procedure Laterality Date  . BREAST BIOPSY Bilateral 1960's to 90's  . CATARACT EXTRACTION, BILATERAL      There were no vitals filed for this visit.  Subjective Assessment - 03/05/18 1758    Subjective   Patient reports she is having a good week planning to celebrate her birthday with friends at [employer] today.    Patient Stated Goals  Patient would like to be able to walk more normal, pick up things from the floor, be able to change her bed.     Currently in Pain?  No/denies    Multiple Pain Sites  No                   OT Treatments/Exercises (OP) - 03/05/18 1758      ADLs   ADL Comments  Patient seen for Focus on quarter turns and half turns with functional mobility, verbal cues and therapist demonstration  followed by patient demonstration of task. Continued focus on handwriting to formulate a list of 10 items with emphasis on legibility. 90% legibility this week       Neurological Re-education Exercises   Other Exercises 1  Patient seen for instruction of LSVT BIG exercises: LSVT Daily Session Maximal Daily Exercises: Sustained movements are designed to rescale the amplitude of movement output for generalization to daily functional activities. Performed as follows for 1 set of 10 repetitions each: Multi directional sustained movements- 1) Floor to ceiling, 2) Side to side. Multi directional Repetitive movements performed in standing and are designed to provide retraining effort needed for sustained muscle activation in tasks Performed as follows: 3) Step and reach forward, 4) Step and Reach Backwards, 5) Step and reach sideways, 6) Rock and reach forward/backward, 7) Rock and reach sideways. For standing exercises, patient required CGA to perform and for balance and exercises performed in a standard technique in the clinic with therapist assist.  Sit to stand from mat table on lowest setting with cues for weight shift, technique and CGA for 10 reps for 1 set, reciprocal stepping patterns with  use of one handrail for balance.  Continued with Advanced complexity of exercises to alternating sides with maximal daily exercises.      Other Exercises 2  Stair negotiation this date for 5 trials of 5 steps with cues for turning at the top of the step with big movements.  Functional mobility for 2 sets of  450 feet each with one short rest break between sets.              OT Education - 03/05/18 1801    Education provided  Yes    Education Details  HEP, turns    Person(s) Educated  Patient    Methods  Explanation;Demonstration;Verbal cues    Comprehension  Verbal cues required;Returned demonstration;Verbalized understanding          OT Long Term Goals - 02/26/18 1423      OT LONG TERM GOAL #1    Title  Patient will improve gait speed and endurance and be able to walk 900 feet in 6 minutes to negotiate around the home and community safely in 4 weeks     Baseline  750 at evaluation    Time  4    Period  Weeks    Status  On-going      OT LONG TERM GOAL #2   Title  Patient will complete HEP for maximal daily exercises with modified independence in 4 weeks       Baseline  no current program at eval    Time  4    Period  Weeks    Status  On-going      OT LONG TERM GOAL #3   Title  Patient will transfer from sit to stand without the use of arms safely and independently from a variety of chairs/surfaces in 4 weeks.    Baseline  unable to perform 5 times sit to stand at eval    Time  4    Period  Weeks    Status  On-going      OT LONG TERM GOAL #4   Title  Patient will decrease frequency of freezing episodes with score of 10 or less on Freezing of Gait questionnaire.       Baseline  eval score of 11    Time  4    Period  Weeks    Status  On-going      OT LONG TERM GOAL #5   Title  Patient will perform handwriting of name with 80% legibility.     Baseline  micrographia and poor legibility at eval    Time  4    Period  Weeks    Status  On-going            Plan - 03/05/18 1802    Clinical Impression Statement  Patient continues to demonstrate significant progress with improved balance, improved posture, improved performance with daily exercises and coordination. She has improved with sit to stand transitional movements with quality of movement and weight shifting forward as well as speed of performance with task. She continues to benefit from skilled occupational therapy and participation with LSVT big program to increase his safety and independence in daily tasks.    Occupational Profile and client history currently impacting functional performance  balance deficits    Occupational performance deficits (Please refer to evaluation for details):  ADL's;IADL's;Leisure    Rehab  Potential  Good    Current Impairments/barriers affecting progress:  positive:  motivation, support/  Negative:  progressive disease process  OT Frequency  4x / week    OT Duration  4 weeks    OT Treatment/Interventions  Self-care/ADL training;Therapeutic exercise;Gait Training;Moist Heat;Neuromuscular education;Stair Training;Patient/family education;Building services engineer;Therapeutic activities;Balance training;DME and/or AE instruction;Manual Therapy    Consulted and Agree with Plan of Care  Patient       Patient will benefit from skilled therapeutic intervention in order to improve the following deficits and impairments:  Abnormal gait, Impaired flexibility, Pain, Decreased coordination, Decreased mobility, Decreased activity tolerance, Decreased endurance, Decreased range of motion, Decreased strength, Decreased balance, Difficulty walking, Impaired UE functional use, Improper body mechanics  Visit Diagnosis: Muscle weakness (generalized)  Difficulty in walking, not elsewhere classified  Other lack of coordination  Unsteadiness on feet    Problem List Patient Active Problem List   Diagnosis Date Noted  . Primary Parkinsonism (HCC) 01/05/2018  . SIADH (syndrome of inappropriate ADH production) (HCC) 10/01/2017  . Spinal stenosis 10/01/2017  . COPD (chronic obstructive pulmonary disease) with chronic bronchitis (HCC) 08/12/2016  . Asthma without status asthmaticus 03/31/2016  . Osteoporosis 03/31/2016  . Fatty liver determined by biopsy 09/17/2015  . Parotid swelling 09/13/2015  . Urticaria 09/13/2015  . Cerebrovascular accident (CVA) (HCC) 08/21/2015  . Senile purpura (HCC) 08/21/2015  . Atrial tachycardia (HCC) 08/13/2015  . Eczema 07/10/2015  . Allergic rhinitis 06/26/2015  . Hyperkalemia 05/03/2015  . Anxiety 04/23/2015  . Edema leg 03/27/2015  . Primary cardiomyopathy (HCC) 03/05/2015  . Benign essential HTN 02/28/2015  . Combined fat and carbohydrate  induced hyperlipemia 09/06/2014  . COPD, severe (HCC) 08/01/2014  . Acid reflux 08/01/2014   Rubin Dais T Arne Cleveland, OTR/L, CLT  Lori Brady 03/05/2018, 6:03 PM  [facility] MAIN [facility] SERVICES [address] [place], [place], [address] Phone: [phone]   Fax:  [phone]  Name: Lori Brady MRN: [id number] Date of Birth: 1945/11/18

## 2018-03-05 NOTE — Therapy (Signed)
[place] MAIN [facility] SERVICES [address] [place], [place], [address] Phone: [phone]   Fax:  [phone]  Occupational Therapy Treatment/Progress Update Reporting period:  02/07/2018 to 02/28/2018   Patient Details  Name: Lori Brady MRN: [id number] Date of Birth: 1946/06/20 Referring Provider: Joselyn Arrow   Encounter Date: 02/28/2018  OT End of Session - 03/05/18 1609    Visit Number  10    Number of Visits  17    Date for OT Re-Evaluation  03/18/18    Authorization Type  medicare    OT Start Time  1300    OT Stop Time  1400    OT Time Calculation (min)  60 min    Activity Tolerance  Patient tolerated treatment well    Behavior During Therapy  [facility] for tasks assessed/performed       Past Medical History:  Diagnosis Date  . Arthritis   . Asthma   . Crohn's disease ([place])   . Emphysema of lung ([place])   . GERD (gastroesophageal reflux disease)   . Hip fracture ([place]) 01/26/2017  . History of chicken pox   . History of heart attack   . Hx of completed stroke   . Hypercholesteremia   . Hypertension   . Pneumonia   . Seasonal allergies   . Sepsis ([place])   . Septic shock ([place]) 10/08/2016  . UTI (lower urinary tract infection)     Past Surgical History:  Procedure Laterality Date  . BREAST BIOPSY Bilateral 1960's to 90's  . CATARACT EXTRACTION, BILATERAL      There were no vitals filed for this visit.  Subjective Assessment - 03/05/18 1608    Subjective   Patient reports she had a good weekend her family took her out for her birthday. She states her family could tell a big difference in her walking.     Pertinent History  Patient reports she fell in the kitchen in March last year and broke her hip.  She was having increased difficulty with movement and decreased balance, she has had 2 injections in her back to help with walking better and she doesn't feel it has helped much. .She was referred to neurology and was diagnosed with  Parkinson's in Dec 2018.     Patient Stated Goals  Patient would like to be able to walk more normal, pick up things from the floor, be able to change her bed.     Currently in Pain?  No/denies    Multiple Pain Sites  No                   OT Treatments/Exercises (OP) - 03/05/18 0001      ADLs   ADL Comments  Functional component tasks established with sit to stand with cues and occasional assist from mat, handwriting for increased legibility and size, manipulation of small objects with cues for prehension patterns, stepping on and off curbs with CGA and cues, negotiatiing stairs with cues and SBA and use of handrails. Patient seen for handwriting with focus on formulation of lists using large lined paper and large grip pen. Cues for larger letter formation when writing list. Legibility was 70%.       Neurological Re-education Exercises   Other Exercises 1  Patient seen for instruction of LSVT BIG exercises: LSVT Daily Session Maximal Daily Exercises: Sustained movements are designed to rescale the amplitude of movement output for generalization to daily functional activities. Performed as follows for  1 set of 10 repetitions each: Multi directional sustained movements- 1) Floor to ceiling, 2) Side to side. Multi directional Repetitive movements performed in standing and are designed to provide retraining effort needed for sustained muscle activation in tasks Performed as follows: 3) Step and reach forward, 4) Step and Reach Backwards, 5) Step and reach sideways, 6) Rock and reach forward/backward, 7) Rock and reach sideways. Seated exercises required cues and therapist guiding.  For standing exercises, patient required CGA assist to perform and for balance.  Sit to stand from mat table on lowest setting with cues for weight shift, technique and CGA assist for 10 reps for 1 set.      Other Exercises 2  6 minute walk test 840 feet, 5 times sit to stand 18 secs.              OT  Education - 03/05/18 1608    Education provided  Yes    Education Details  turning during mobility    Person(s) Educated  Patient    Methods  Explanation;Demonstration;Verbal cues    Comprehension  Verbal cues required;Returned demonstration;Verbalized understanding          OT Long Term Goals - 02/26/18 1423      OT LONG TERM GOAL #1   Title  Patient will improve gait speed and endurance and be able to walk 900 feet in 6 minutes to negotiate around the home and community safely in 4 weeks     Baseline  750 at evaluation    Time  4    Period  Weeks    Status  On-going      OT LONG TERM GOAL #2   Title  Patient will complete HEP for maximal daily exercises with modified independence in 4 weeks       Baseline  no current program at eval    Time  4    Period  Weeks    Status  On-going      OT LONG TERM GOAL #3   Title  Patient will transfer from sit to stand without the use of arms safely and independently from a variety of chairs/surfaces in 4 weeks.    Baseline  unable to perform 5 times sit to stand at eval    Time  4    Period  Weeks    Status  On-going      OT LONG TERM GOAL #4   Title  Patient will decrease frequency of freezing episodes with score of 10 or less on Freezing of Gait questionnaire.       Baseline  eval score of 11    Time  4    Period  Weeks    Status  On-going      OT LONG TERM GOAL #5   Title  Patient will perform handwriting of name with 80% legibility.     Baseline  micrographia and poor legibility at eval    Time  4    Period  Weeks    Status  On-going            Plan - 03/05/18 1609    Clinical Impression Statement  Patient continues to make excellent gains with her balance, functional mobility, and coordination to perform necessary daily tasks. Re-assessment was performed and six minute walk test improved to 840 feet, five times sit to stand improved to 18 seconds. She continues to benefit from instruction with intensive LSVT big  program and continues to work towards  goals.    Occupational Profile and client history currently impacting functional performance  balance deficits    Occupational performance deficits (Please refer to evaluation for details):  ADL's;IADL's;Leisure    Rehab Potential  Good    Current Impairments/barriers affecting progress:  positive:  motivation, support/  Negative:  progressive disease process    OT Frequency  4x / week    OT Duration  4 weeks    OT Treatment/Interventions  Self-care/ADL training;Therapeutic exercise;Gait Training;Moist Heat;Neuromuscular education;Stair Training;Patient/family education;Therapist, nutritional;Therapeutic activities;Balance training;DME and/or AE instruction;Manual Therapy    Consulted and Agree with Plan of Care  Patient       Patient will benefit from skilled therapeutic intervention in order to improve the following deficits and impairments:  Abnormal gait, Impaired flexibility, Pain, Decreased coordination, Decreased mobility, Decreased activity tolerance, Decreased endurance, Decreased range of motion, Decreased strength, Decreased balance, Difficulty walking, Impaired UE functional use, Improper body mechanics  Visit Diagnosis: Muscle weakness (generalized)  Difficulty in walking, not elsewhere classified  Other lack of coordination  Unsteadiness on feet    Problem List Patient Active Problem List   Diagnosis Date Noted  . Primary Parkinsonism ([place]) 01/05/2018  . SIADH (syndrome of inappropriate ADH production) ([place]) 10/01/2017  . Spinal stenosis 10/01/2017  . COPD (chronic obstructive pulmonary disease) with chronic bronchitis ([place]) 08/12/2016  . Asthma without status asthmaticus 03/31/2016  . Osteoporosis 03/31/2016  . Fatty liver determined by biopsy 09/17/2015  . Parotid swelling 09/13/2015  . Urticaria 09/13/2015  . Cerebrovascular accident (CVA) ([place]) 08/21/2015  . Senile purpura ([place]) 08/21/2015  . Atrial tachycardia ([place])  08/13/2015  . Eczema 07/10/2015  . Allergic rhinitis 06/26/2015  . Hyperkalemia 05/03/2015  . Anxiety 04/23/2015  . Edema leg 03/27/2015  . Primary cardiomyopathy ([place]) 03/05/2015  . Benign essential HTN 02/28/2015  . Combined fat and carbohydrate induced hyperlipemia 09/06/2014  . COPD, severe ([place]) 08/01/2014  . Acid reflux 08/01/2014   Achilles Dunk, OTR/L, CLT  Lovett,Amy 03/05/2018, 4:12 PM  [place] MAIN [facility] SERVICES [address] [place], [place], [address] Phone: [phone]   Fax:  [phone]  Name: Lori Brady MRN: [id number] Date of Birth: [birth year]

## 2018-03-05 NOTE — Therapy (Signed)
[facility] MAIN [facility] SERVICES [address] [place], [place], [address] Phone: [phone]   Fax:  [phone]  Occupational Therapy Treatment  Patient Details  Name: Lori Brady MRN: [id number] Date of Birth: Dec 31, 1945 Referring Provider: Steele Sizer   Encounter Date: 03/01/2018  OT End of Session - 03/05/18 1712    Visit Number  11    Number of Visits  17    Date for OT Re-Evaluation  03/18/18    Authorization Type  medicare    OT Start Time  1300    OT Stop Time  1357    OT Time Calculation (min)  57 min    Activity Tolerance  Patient tolerated treatment well    Behavior During Therapy  [facility] for tasks assessed/performed       Past Medical History:  Diagnosis Date  . Arthritis   . Asthma   . Crohn's disease (HCC)   . Emphysema of lung (HCC)   . GERD (gastroesophageal reflux disease)   . Hip fracture (HCC) 01/26/2017  . History of chicken pox   . History of heart attack   . Hx of completed stroke   . Hypercholesteremia   . Hypertension   . Pneumonia   . Seasonal allergies   . Sepsis (HCC)   . Septic shock (HCC) 10/08/2016  . UTI (lower urinary tract infection)     Past Surgical History:  Procedure Laterality Date  . BREAST BIOPSY Bilateral 1960's to 90's  . CATARACT EXTRACTION, BILATERAL      There were no vitals filed for this visit.  Subjective Assessment - 03/05/18 1708    Subjective   Patient reports she is doing well and is ready to work towards advancing her exercises.     Pertinent History  Patient reports she fell in the kitchen in March last year and broke her hip.  She was having increased difficulty with movement and decreased balance, she has had 2 injections in her back to help with walking better and she doesn't feel it has helped much. .She was referred to neurology and was diagnosed with Parkinson's in Dec 2018.     Patient Stated Goals  Patient would like to be able to walk more normal, pick up things from  the floor, be able to change her bed.     Currently in Pain?  No/denies    Multiple Pain Sites  No                   OT Treatments/Exercises (OP) - 03/05/18 1709      ADLs   ADL Comments  Patient seen for Focus on quarter turns and half turns with functional mobility, verbal cues and therapist demonstration followed by patient demonstration of task. Continued focus on he in writing to formulate a list of 10 items with emphasis on legibility. 90% legibility this day with two complete lists.       Neurological Re-education Exercises   Other Exercises 1  Patient seen for instruction of LSVT BIG exercises: LSVT Daily Session Maximal Daily Exercises: Sustained movements are designed to rescale the amplitude of movement output for generalization to daily functional activities. Performed as follows for 1 set of 10 repetitions each: Multi directional sustained movements- 1) Floor to ceiling, 2) Side to side. Multi directional Repetitive movements performed in standing and are designed to provide retraining effort needed for sustained muscle activation in tasks Performed as follows: 3) Step and reach forward, 4) Step  and Reach Backwards, 5) Step and reach sideways, 6) Rock and reach forward/backward, 7) Rock and reach sideways. Seated exercises required cues and therapist guiding.  For standing exercises, patient required CGA assist to perform and for balance.  Sit to stand from mat table on lowest setting with cues for weight shift, technique and CGA assist for 10 reps for 1 set.  Increased complexity by introducing alternation of sides while performing exercises.     Other Exercises 2  Functional mobility for 2 trials of 500 feet with cues for reciprocal arm movements, amplitude of steps and turns             OT Education - 03/05/18 1712    Education provided  Yes    Education Details  alternating sides during maximal daily exercises    Person(s) Educated  Patient    Methods   Explanation;Demonstration;Verbal cues    Comprehension  Verbal cues required;Returned demonstration;Verbalized understanding          OT Long Term Goals - 02/26/18 1423      OT LONG TERM GOAL #1   Title  Patient will improve gait speed and endurance and be able to walk 900 feet in 6 minutes to negotiate around the home and community safely in 4 weeks     Baseline  750 at evaluation    Time  4    Period  Weeks    Status  On-going      OT LONG TERM GOAL #2   Title  Patient will complete HEP for maximal daily exercises with modified independence in 4 weeks       Baseline  no current program at eval    Time  4    Period  Weeks    Status  On-going      OT LONG TERM GOAL #3   Title  Patient will transfer from sit to stand without the use of arms safely and independently from a variety of chairs/surfaces in 4 weeks.    Baseline  unable to perform 5 times sit to stand at eval    Time  4    Period  Weeks    Status  On-going      OT LONG TERM GOAL #4   Title  Patient will decrease frequency of freezing episodes with score of 10 or less on Freezing of Gait questionnaire.       Baseline  eval score of 11    Time  4    Period  Weeks    Status  On-going      OT LONG TERM GOAL #5   Title  Patient will perform handwriting of name with 80% legibility.     Baseline  micrographia and poor legibility at eval    Time  4    Period  Weeks    Status  On-going            Plan - 03/05/18 1712    Clinical Impression Statement  Patient continues to make good progress. Advanced maximal daily exercises to include alternating sides to increase complexity of task. This change provided increased difficulty for balance with exercises in standing. Will continue to work towards improving balance with transitional movements and functional mobility.     Occupational Profile and client history currently impacting functional performance  balance deficits    Occupational performance deficits (Please  refer to evaluation for details):  ADL's;IADL's;Leisure    Rehab Potential  Good    Current Impairments/barriers affecting  progress:  positive:  motivation, support/  Negative:  progressive disease process    OT Frequency  4x / week    OT Duration  4 weeks    OT Treatment/Interventions  Self-care/ADL training;Therapeutic exercise;Gait Training;Moist Heat;Neuromuscular education;Stair Training;Patient/family education;Building services engineer;Therapeutic activities;Balance training;DME and/or AE instruction;Manual Therapy    Consulted and Agree with Plan of Care  Patient       Patient will benefit from skilled therapeutic intervention in order to improve the following deficits and impairments:  Abnormal gait, Impaired flexibility, Pain, Decreased coordination, Decreased mobility, Decreased activity tolerance, Decreased endurance, Decreased range of motion, Decreased strength, Decreased balance, Difficulty walking, Impaired UE functional use, Improper body mechanics  Visit Diagnosis: Muscle weakness (generalized)  Difficulty in walking, not elsewhere classified  Other lack of coordination  Unsteadiness on feet    Problem List Patient Active Problem List   Diagnosis Date Noted  . Primary Parkinsonism (HCC) 01/05/2018  . SIADH (syndrome of inappropriate ADH production) (HCC) 10/01/2017  . Spinal stenosis 10/01/2017  . COPD (chronic obstructive pulmonary disease) with chronic bronchitis (HCC) 08/12/2016  . Asthma without status asthmaticus 03/31/2016  . Osteoporosis 03/31/2016  . Fatty liver determined by biopsy 09/17/2015  . Parotid swelling 09/13/2015  . Urticaria 09/13/2015  . Cerebrovascular accident (CVA) (HCC) 08/21/2015  . Senile purpura (HCC) 08/21/2015  . Atrial tachycardia (HCC) 08/13/2015  . Eczema 07/10/2015  . Allergic rhinitis 06/26/2015  . Hyperkalemia 05/03/2015  . Anxiety 04/23/2015  . Edema leg 03/27/2015  . Primary cardiomyopathy (HCC) 03/05/2015  .  Benign essential HTN 02/28/2015  . Combined fat and carbohydrate induced hyperlipemia 09/06/2014  . COPD, severe (HCC) 08/01/2014  . Acid reflux 08/01/2014   Lori Brady, Lori Brady, Lori Brady  Lori Brady 03/05/2018, 5:13 PM  [facility] MAIN [facility] SERVICES [address] [place], [place], [address] Phone: [phone]   Fax:  [phone]  Name: Lori Brady MRN: [id number] Date of Birth: Jul 28, 1946

## 2018-03-07 ENCOUNTER — Ambulatory Visit: Payer: Medicare Other | Admitting: Occupational Therapy

## 2018-03-07 ENCOUNTER — Encounter: Payer: Self-pay | Admitting: Internal Medicine

## 2018-03-07 ENCOUNTER — Ambulatory Visit: Payer: Medicare Other | Admitting: Internal Medicine

## 2018-03-07 VITALS — BP 110/58 | HR 91 | Resp 16 | Ht 60.0 in | Wt 150.6 lb

## 2018-03-07 DIAGNOSIS — K5010 Crohn's disease of large intestine without complications: Secondary | ICD-10-CM

## 2018-03-07 DIAGNOSIS — R262 Difficulty in walking, not elsewhere classified: Secondary | ICD-10-CM

## 2018-03-07 DIAGNOSIS — G20 Parkinson's disease: Secondary | ICD-10-CM

## 2018-03-07 DIAGNOSIS — J449 Chronic obstructive pulmonary disease, unspecified: Secondary | ICD-10-CM | POA: Diagnosis not present

## 2018-03-07 DIAGNOSIS — J301 Allergic rhinitis due to pollen: Secondary | ICD-10-CM | POA: Diagnosis not present

## 2018-03-07 DIAGNOSIS — R0602 Shortness of breath: Secondary | ICD-10-CM

## 2018-03-07 DIAGNOSIS — R278 Other lack of coordination: Secondary | ICD-10-CM

## 2018-03-07 DIAGNOSIS — M6281 Muscle weakness (generalized): Secondary | ICD-10-CM

## 2018-03-07 DIAGNOSIS — R2681 Unsteadiness on feet: Secondary | ICD-10-CM

## 2018-03-07 DIAGNOSIS — I639 Cerebral infarction, unspecified: Secondary | ICD-10-CM | POA: Insufficient documentation

## 2018-03-07 MED ORDER — ALBUTEROL SULFATE HFA 108 (90 BASE) MCG/ACT IN AERS: 2.0000 | INHALATION_SPRAY | Freq: Four times a day (QID) | RESPIRATORY_TRACT | 0 refills | Status: DC | PRN

## 2018-03-07 MED ORDER — CETIRIZINE HCL 10 MG PO TABS: 10.0000 mg | ORAL_TABLET | Freq: Every day | ORAL | 11 refills | Status: DC

## 2018-03-07 NOTE — Patient Instructions (Signed)

## 2018-03-07 NOTE — Progress Notes (Signed)
[facility] [address] [place], [place] [address]  Pulmonary Sleep Medicine   Office Visit Note  Patient Name: Lori Brady DOB: [birth year] MRN [id number]  Date of Service: 03/07/2018  Complaints/HPI:  She is doing fairly well although she still has some shortness of breath when she exerts herself.  She is using ProAir which does help her.  She has not had relief previously with Symbicort.  The patient states that she has no chest pain she is having some coughing and some wheezing.  She is not able to do her spirometry today.  The patient also would rather do the complete pulmonary function test.  She has been using Allegra but does not seem to be getting the same benefit S suggested changing her to Zyrtec and a prescription was given for this.  In addition she is seeing Neurology for Parkinson's and states that this is gradually improving  ROS  General: (-) fever, (-) chills, (-) night sweats, (-) weakness Skin: (-) rashes, (-) itching,. Eyes: (-) visual changes, (-) redness, (-) itching. Nose and Sinuses: (-) nasal stuffiness or itchiness, (-) postnasal drip, (-) nosebleeds, (-) sinus trouble. Mouth and Throat: (-) sore throat, (-) hoarseness. Neck: (-) swollen glands, (-) enlarged thyroid, (-) neck pain. Respiratory: + cough, (-) bloody sputum, + shortness of breath, + wheezing. Cardiovascular: - ankle swelling, (-) chest pain. Lymphatic: (-) lymph node enlargement. Neurologic: (-) numbness, (-) tingling. Psychiatric: (-) anxiety, (-) depression   Current Medication: Outpatient Encounter Medications as of 03/07/2018  Medication Sig Note  . Abaloparatide (TYMLOS) 3120 MCG/1.56ML SOPN Inject into the skin daily.    Marland Kitchen albuterol (ACCUNEB) 0.63 MG/3ML nebulizer solution Take 1 ampule by nebulization every 6 (six) hours as needed for wheezing.   Marland Kitchen albuterol (PROVENTIL HFA;VENTOLIN HFA) 108 (90 Base) MCG/ACT inhaler Inhale 2 puffs into the lungs every 4 (four) hours as  needed for wheezing or shortness of breath.   Marland Kitchen aspirin 81 MG tablet Take 81 mg by mouth daily. 01/26/2017: Filled 08/2015  . betamethasone valerate (VALISONE) 0.1 % cream APPLY TOPICALLY TO AFFECTED AREA SPARINGLY 2 TIMES DAILY.   Marland Kitchen BREO ELLIPTA 100-25 MCG/INH AEPB USE 1 PUFF ONCE DAILY AS DIRECTED   . carbidopa-levodopa (SINEMET IR) 25-100 MG tablet Take 2 tablets by mouth 3 (three) times daily.    . chlorthalidone (HYGROTON) 25 MG tablet Take 1 tablet (25 mg total) by mouth daily.   . Cholecalciferol (VITAMIN D) 2000 UNITS CAPS Take 1 capsule by mouth daily.   . citalopram (CELEXA) 20 MG tablet TAKE 1 TABLET BY MOUTH ONCE DAILY   . FLUAD 0.5 ML SUSY ADM 0.5ML IM UTD   . fluticasone (FLONASE) 50 MCG/ACT nasal spray Place 2 sprays into both nostrils daily.   Marland Kitchen gabapentin (NEURONTIN) 100 MG capsule Take 100 mg by mouth 3 (three) times daily.   Marland Kitchen guaiFENesin (MUCINEX) 600 MG 12 hr tablet Take 1 tablet (600 mg total) by mouth 2 (two) times daily.   Marland Kitchen ibuprofen (ADVIL,MOTRIN) 200 MG tablet Take 800 mg by mouth every 6 (six) hours as needed.   Marland Kitchen lisinopril (PRINIVIL,ZESTRIL) 20 MG tablet Take 20 mg by mouth daily.    . montelukast (SINGULAIR) 10 MG tablet Take 1 tablet (10 mg total) by mouth daily.   . Omega-3 Fatty Acids (FISH OIL) 1000 MG CAPS Take 1 capsule by mouth daily.   . potassium chloride SA (K-DUR,KLOR-CON) 20 MEQ tablet Take 1 tablet (20 mEq total) by mouth daily.   Marland Kitchen  simvastatin (ZOCOR) 40 MG tablet TAKE 1 TABLET BY MOUTH AT BEDTIME   . Tdap (BOOSTRIX) 5-2.5-18.5 LF-MCG/0.5 injection Boostrix Tdap 2.5 Lf unit-8 mcg-5 Lf/0.5 mL intramuscular syringe   . Tiotropium Bromide Monohydrate (SPIRIVA RESPIMAT) 2.5 MCG/ACT AERS Inhale 2 puffs into the lungs daily.    No facility-administered encounter medications on file as of 03/07/2018.     Surgical History: Past Surgical History:  Procedure Laterality Date  . BREAST BIOPSY Bilateral 1960's to 90's  . CATARACT EXTRACTION, BILATERAL       Medical History: Past Medical History:  Diagnosis Date  . Arthritis   . Asthma   . Crohn's disease (HCC)   . Emphysema of lung (HCC)   . GERD (gastroesophageal reflux disease)   . Hip fracture (HCC) 01/26/2017  . History of chicken pox   . History of heart attack   . Hx of completed stroke   . Hypercholesteremia   . Hypertension   . Pneumonia   . Seasonal allergies   . Sepsis (HCC)   . Septic shock (HCC) 10/08/2016  . UTI (lower urinary tract infection)     Family History: Family History  Problem Relation Age of Onset  . Cancer Sister        ovarian/uterine/breast  . Breast cancer Sister 46  . Cancer Sister        breast  . Heart disease Sister   . Breast cancer Sister 64  . Heart disease Father   . Cancer Father        liver  . Cancer Mother   . Heart disease Brother   . Heart attack Brother   . Cancer Brother        liver  . Heart disease Sister   . Diabetes Maternal Grandmother     Social History: Social History   Socioeconomic History  . Marital status: Widowed    Spouse name: Not on file  . Number of children: 3  . Years of education: Not on file  . Highest education level: Some college, no degree  Occupational History  . Occupation: retired  Engineer, production  . Financial resource strain: Not hard at all  . Food insecurity:    Worry: Never true    Inability: Never true  . Transportation needs:    Medical: No    Non-medical: No  Tobacco Use  . Smoking status: Former Smoker    Packs/day: 1.00    Years: 30.00    Pack years: 30.00    Types: Cigarettes    Last attempt to quit: 08/01/2008    Years since quitting: 9.6  . Smokeless tobacco: Never Used  Substance and Sexual Activity  . Alcohol use: Yes    Alcohol/week: 0.6 oz    Types: 1 Shots of liquor per week  . Drug use: No  . Sexual activity: Not on file  Lifestyle  . Physical activity:    Days per week: Not on file    Minutes per session: Not on file  . Stress: Not at all   Relationships  . Social connections:    Talks on phone: Not on file    Gets together: Not on file    Attends religious service: Not on file    Active member of club or organization: Not on file    Attends meetings of clubs or organizations: Not on file    Relationship status: Not on file  . Intimate partner violence:    Fear of current or ex partner: Not  on file    Emotionally abused: Not on file    Physically abused: Not on file    Forced sexual activity: Not on file  Other Topics Concern  . Not on file  Social History Narrative  . Not on file    Vital Signs: Blood pressure (!) 110/58, pulse 91, resp. rate 16, height 5' (1.524 m), weight 150 lb 9.6 oz (68.3 kg), SpO2 98 %.  Examination: General Appearance: The patient is well-developed, well-nourished, and in no distress. Skin: Gross inspection of skin unremarkable. Head: normocephalic, no gross deformities. Eyes: no gross deformities noted. ENT: ears appear grossly normal no exudates. Neck: Supple. No thyromegaly. No LAD. Respiratory:  Scattered rhonchi are noted bilaterally. Cardiovascular: Normal S1 and S2 without murmur or rub. Extremities: No cyanosis. pulses are equal. Neurologic: Alert and oriented. No involuntary movements.  LABS: Recent Results (from the past 2160 hour(s))  Comprehensive Metabolic Panel (CMET)     Status: Abnormal   Collection Time: 01/04/18 10:49 AM  Result Value Ref Range   Glucose 97 65 - 99 mg/dL   BUN 10 8 - 27 mg/dL   Creatinine, Ser 1.30 0.57 - 1.00 mg/dL   GFR calc non Af Amer 73 >59 mL/min/1.73   GFR calc Af Amer 84 >59 mL/min/1.73   BUN/Creatinine Ratio 12 12 - 28   Sodium 129 (L) 134 - 144 mmol/L   Potassium 4.3 3.5 - 5.2 mmol/L   Chloride 89 (L) 96 - 106 mmol/L   CO2 23 20 - 29 mmol/L   Calcium 9.4 8.7 - 10.3 mg/dL   Total Protein 7.2 6.0 - 8.5 g/dL   Albumin 4.5 3.6 - 4.8 g/dL   Globulin, Total 2.7 1.5 - 4.5 g/dL   Albumin/Globulin Ratio 1.7 1.2 - 2.2   Bilirubin Total  0.8 0.0 - 1.2 mg/dL   Alkaline Phosphatase 102 39 - 117 IU/L   AST 37 0 - 40 IU/L   ALT 13 0 - 32 IU/L  Lipid Profile     Status: None   Collection Time: 01/04/18 10:49 AM  Result Value Ref Range   Cholesterol, Total 164 100 - 199 mg/dL   Triglycerides 865 0 - 149 mg/dL   HDL 54 >78 mg/dL   VLDL Cholesterol Cal 21 5 - 40 mg/dL   LDL Calculated 89 0 - 99 mg/dL   Chol/HDL Ratio 3.0 0.0 - 4.4 ratio    Comment:                                   T. Chol/HDL Ratio                                             Men  Women                               1/2 Avg.Risk  3.4    3.3                                   Avg.Risk  5.0    4.4  2X Avg.Risk  9.6    7.1                                3X Avg.Risk 23.4   11.0   TSH     Status: None   Collection Time: 01/04/18 10:49 AM  Result Value Ref Range   TSH 1.580 0.450 - 4.500 uIU/mL    Radiology: Mr Brain Wo Contrast  Result Date: 11/03/2017 CLINICAL DATA:  Parkinsonism. White matter disease. Quantitative volume scan requested. EXAM: MRI HEAD WITHOUT CONTRAST TECHNIQUE: Multiplanar, multiecho pulse sequences of the brain and surrounding structures were obtained without intravenous contrast. COMPARISON:  MRI 05/05/2006 FINDINGS: Brain: Diffusion imaging does not show any acute or subacute infarction. There is wall air in degeneration within the left side of the brainstem. The brainstem does not show diffuse small-vessel ischemic changes. No focal cerebellar insult. Cerebral hemispheres show old infarction in the left basal ganglia and radiating white matter tracts. There are moderate chronic small-vessel ischemic changes of the white matter bilaterally, progressive since 2007. No cortical or large vessel territory infarction. No neoplastic mass lesion, hemorrhage, hydrocephalus or extra-axial collection. There is some hemosiderin deposition in the old left basal ganglia infarction. Susceptibility weighted imaging does not show  conclusive findings in the mid brain. One could argue that there is loss of the swallow tail sign on the left. No hydrocephalus. No extra-axial collection. No neoplastic mass lesion. Quantitative imaging was performed using the NeuroQuant product. See report for complete data. Cortical gray matter normative percentile 6%. Cerebral white matter normative percentile 93%. Hippocampi normative percentile 51%. Vascular: Major vessels at the base of the brain show flow. Skull and upper cervical spine: Negative Sinuses/Orbits: Clear/normal Other: None IMPRESSION: No acute finding. Moderate chronic small-vessel ischemic changes of the cerebral hemispheric white matter. Old infarction in the left basal ganglia and radiating white matter tracts with left brainstem wallerian degeneration. NeuroQuant shows primarily cortical volume loss with overall preservation of the white matter volume. Relative hippocampal preservation. Electronically Signed   By: Paulina Fusi M.D.   On: 11/03/2017 14:34    No results found.  No results found.    Assessment and Plan: Patient Active Problem List   Diagnosis Date Noted  . Stroke (HCC) 03/07/2018  . Primary Parkinsonism (HCC) 01/05/2018  . SIADH (syndrome of inappropriate ADH production) (HCC) 10/01/2017  . Spinal stenosis 10/01/2017  . Elevated troponin I level 11/18/2016  . COPD (chronic obstructive pulmonary disease) with chronic bronchitis (HCC) 08/12/2016  . Asthma without status asthmaticus 03/31/2016  . Osteoporosis 03/31/2016  . Fatty liver determined by biopsy 09/17/2015  . Parotid swelling 09/13/2015  . Urticaria 09/13/2015  . Cerebrovascular accident (CVA) (HCC) 08/21/2015  . Senile purpura (HCC) 08/21/2015  . Atrial tachycardia (HCC) 08/13/2015  . Eczema 07/10/2015  . Allergic rhinitis 06/26/2015  . Hyperkalemia 05/03/2015  . Anxiety 04/23/2015  . Edema leg 03/27/2015  . Primary cardiomyopathy (HCC) 03/05/2015  . CAD (coronary artery disease)  03/05/2015  . Benign essential HTN 02/28/2015  . Combined fat and carbohydrate induced hyperlipemia 09/06/2014  . COPD, severe (HCC) 08/01/2014  . Acid reflux 08/01/2014    1. COPD patient will be scheduled for follow-up pulmonary functions.  Prescription was given for albuterol.  We will continue with supportive care 2. Allergic rhinitis prescription given for Zyrtec she was instructed to not take the Allegra 3. Parkinsons continue following neurologist she states that her medications is being  gradually built up 4. Crohns disease stable at this time continue with supportive care  General Counseling: I have discussed the findings of the evaluation and examination with Analiese.  I have also discussed any further diagnostic evaluation thatmay be needed or ordered today. Litzi verbalizes understanding of the findings of todays visit. We also reviewed her medications today and discussed drug interactions and side effects including but not limited excessive drowsiness and altered mental states. We also discussed that there is always a risk not just to her but also people around her. she has been encouraged to call the office with any questions or concerns that should arise related to todays visit.    Time spent:  25 min  I have personally obtained a history, examined the patient, evaluated laboratory and imaging results, formulated the assessment and plan and placed orders.    Yevonne Pax, MD [facility] Pulmonary and Critical Care Sleep medicine

## 2018-03-08 ENCOUNTER — Ambulatory Visit: Payer: Medicare Other | Admitting: Occupational Therapy

## 2018-03-08 DIAGNOSIS — R262 Difficulty in walking, not elsewhere classified: Secondary | ICD-10-CM

## 2018-03-08 DIAGNOSIS — R2681 Unsteadiness on feet: Secondary | ICD-10-CM

## 2018-03-08 DIAGNOSIS — R278 Other lack of coordination: Secondary | ICD-10-CM

## 2018-03-08 DIAGNOSIS — M6281 Muscle weakness (generalized): Secondary | ICD-10-CM | POA: Diagnosis not present

## 2018-03-09 ENCOUNTER — Ambulatory Visit: Payer: Medicare Other | Admitting: Occupational Therapy

## 2018-03-09 DIAGNOSIS — R262 Difficulty in walking, not elsewhere classified: Secondary | ICD-10-CM

## 2018-03-09 DIAGNOSIS — R2681 Unsteadiness on feet: Secondary | ICD-10-CM

## 2018-03-09 DIAGNOSIS — R278 Other lack of coordination: Secondary | ICD-10-CM

## 2018-03-09 DIAGNOSIS — M6281 Muscle weakness (generalized): Secondary | ICD-10-CM | POA: Diagnosis not present

## 2018-03-10 ENCOUNTER — Ambulatory Visit: Payer: Medicare Other | Admitting: Occupational Therapy

## 2018-03-10 DIAGNOSIS — M6281 Muscle weakness (generalized): Secondary | ICD-10-CM | POA: Diagnosis not present

## 2018-03-10 DIAGNOSIS — R278 Other lack of coordination: Secondary | ICD-10-CM

## 2018-03-10 DIAGNOSIS — R2681 Unsteadiness on feet: Secondary | ICD-10-CM

## 2018-03-10 DIAGNOSIS — R262 Difficulty in walking, not elsewhere classified: Secondary | ICD-10-CM

## 2018-03-12 ENCOUNTER — Encounter: Payer: Self-pay | Admitting: Occupational Therapy

## 2018-03-12 NOTE — Therapy (Signed)
[place] MAIN [facility] SERVICES [address] [place], [place], [address] Phone: [phone]   Fax:  [phone]  Occupational Therapy Treatment/Discharge Summary  Patient Details  Name: Lori Brady MRN: [id number] Date of Birth: [birth year] Referring Provider: Joselyn Arrow   Encounter Date: 03/10/2018  OT End of Session - 03/12/18 1800    Visit Number  17    Number of Visits  17    Date for OT Re-Evaluation  03/18/18    Authorization Type  medicare    OT Start Time  1300    OT Stop Time  1358    OT Time Calculation (min)  58 min    Activity Tolerance  Patient tolerated treatment well    Behavior During Therapy  [facility] for tasks assessed/performed       Past Medical History:  Diagnosis Date  . Arthritis   . Asthma   . Crohn's disease ([place])   . Emphysema of lung ([place])   . GERD (gastroesophageal reflux disease)   . Hip fracture ([place]) 01/26/2017  . History of chicken pox   . History of heart attack   . Hx of completed stroke   . Hypercholesteremia   . Hypertension   . Pneumonia   . Seasonal allergies   . Sepsis ([place])   . Septic shock ([place]) 10/08/2016  . UTI (lower urinary tract infection)     Past Surgical History:  Procedure Laterality Date  . BREAST BIOPSY Bilateral 1960's to 90's  . CATARACT EXTRACTION, BILATERAL      There were no vitals filed for this visit.  Subjective Assessment - 03/12/18 1759    Subjective   Patient states she is going to miss coming to therapy. Patient reports she is pleased with her progress and her family has noticed a big difference in her ability to move.     Pertinent History  Patient reports she fell in the kitchen in March last year and broke her hip.  She was having increased difficulty with movement and decreased balance, she has had 2 injections in her back to help with walking better and she doesn't feel it has helped much. .She was referred to neurology and was diagnosed with Parkinson's in Dec  2018.     Patient Stated Goals  Patient would like to be able to walk more normal, pick up things from the floor, be able to change her bed.     Currently in Pain?  No/denies    Multiple Pain Sites  No                   OT Treatments/Exercises (OP) - 03/12/18 1805      ADLs   ADL Comments  Final review of functional component tasks completed with modified independence and incorporated into hierarchy tasks.  handwiriting at 90% today for legibility.        Neurological Re-education Exercises   Other Exercises 1  Patient seen for performance of LSVT BIG exercises: LSVT Daily Session Maximal Daily Exercises: Sustained movements are designed to rescale the amplitude of movement output for generalization to daily functional activities. Performed as follows for 1 set of 10 repetitions each: Multi directional sustained movements- 1) Floor to ceiling, 2) Side to side. Multi directional Repetitive movements performed in standing and are designed to provide retraining effort needed for sustained muscle activation in tasks Performed as follows: 3) Step and reach forward, 4) Step and Reach Backwards, 5) Step and reach sideways, 6)  Rock and reach forward/backward, 7) Rock and reach sideways.  Sit to stand from mat table on lowest setting with modified independence for 10 reps for 1 set, reciprocal stepping patterns with use of one handrail for balance.  Continued with Advanced complexity of exercises to alternating sides in addition to hand flicks this week with maximal daily exercises.      Other Exercises 2  Reassessment of skills as outlined in goals and clinical impression.  9 hole peg test right 35 sec., left 27 sec.             OT Education - 03/12/18 1759    Education provided  Yes    Education Details  forever exercises, outcome measures, signs of needing follow up therapy in the future    Person(s) Educated  Patient    Methods  Explanation;Demonstration    Comprehension   Verbalized understanding;Returned demonstration          OT Long Term Goals - 03/12/18 1803      OT LONG TERM GOAL #1   Title  Patient will improve gait speed and endurance and be able to walk 900 feet in 6 minutes to negotiate around the home and community safely in 4 weeks     Baseline  750 at evaluation, 1005 at discharge    Time  4    Period  Weeks    Status  Achieved      OT LONG TERM GOAL #2   Title  Patient will complete HEP for maximal daily exercises with modified independence in 4 weeks       Baseline  no current program at eval, independent at discharge    Time  4    Period  Weeks    Status  Achieved      OT LONG TERM GOAL #3   Title  Patient will transfer from sit to stand without the use of arms safely and independently from a variety of chairs/surfaces in 4 weeks.    Baseline  unable to perform 5 times sit to stand at eval, 15 secs at discharge    Time  4    Period  Weeks    Status  Achieved      OT LONG TERM GOAL #4   Title  Patient will decrease frequency of freezing episodes with score of 10 or less on Freezing of Gait questionnaire.       Baseline  eval score of 11, score of 8 at discharge    Time  4    Period  Weeks    Status  Achieved      OT LONG TERM GOAL #5   Title  Patient will perform handwriting of name with 80% legibility.     Baseline  micrographia and poor legibility at eval, improved to 80-90% at discharge    Time  4    Period  Weeks    Status  Achieved            Plan - 03/12/18 1800    Clinical Impression Statement  Patient was seen this date for reassessment of outcome measures as follows: six minute walk test improved from 750 feet at eval to 1005 feet today, five times sit to stand improved from being unable to perform to completing in 15 secs, berg balance test improved from 44/56 at eval to 51/56 today, and freezing of gait questionnaire improved from 11 to score of 8. Patient has demonstrated significant progress in all areas.  She  started with therapy using a rolling walker and she has now progressed to ambulating without an assistive device with good balance and improved amplitude of gait as well as reciprocal arm swing. She is independent with performing her maximal daily exercises and is now able to identify when she is performing incorrectly or with smaller movements and is able to self correct. She has met and exceeded all of her established goals and is ready for discharge at this time. She is aware she will need to continue to perform her maximal daily exercises a minimum of one time a day however therapist would recommend she continue twice a day for the next few weeks since she has continued to make great progress. As her disease progresses she may require additional therapy in the future, please re-consult if needs arise. Patient is aware of things to look for which may indicate the need to return in the future such as increased falls, decreased balance, decreased ability to perform daily tasks, smaller movement patterns, decreased legibility and smaller in writing, etc.     Occupational Profile and client history currently impacting functional performance  balance deficits    Occupational performance deficits (Please refer to evaluation for details):  ADL's;IADL's;Leisure    Rehab Potential  Good    Current Impairments/barriers affecting progress:  positive:  motivation, support/  Negative:  progressive disease process    OT Frequency  4x / week    OT Duration  4 weeks    OT Treatment/Interventions  Self-care/ADL training;Therapeutic exercise;Gait Training;Moist Heat;Neuromuscular education;Stair Training;Patient/family education;Therapist, nutritional;Therapeutic activities;Balance training;DME and/or AE instruction;Manual Therapy    Consulted and Agree with Plan of Care  Patient       Patient will benefit from skilled therapeutic intervention in order to improve the following deficits and impairments:   Abnormal gait, Impaired flexibility, Pain, Decreased coordination, Decreased mobility, Decreased activity tolerance, Decreased endurance, Decreased range of motion, Decreased strength, Decreased balance, Difficulty walking, Impaired UE functional use, Improper body mechanics  Visit Diagnosis: Difficulty in walking, not elsewhere classified  Muscle weakness (generalized)  Other lack of coordination  Unsteadiness on feet    Problem List Patient Active Problem List   Diagnosis Date Noted  . Stroke ([place]) 03/07/2018  . Primary Parkinsonism ([place]) 01/05/2018  . SIADH (syndrome of inappropriate ADH production) ([place]) 10/01/2017  . Spinal stenosis 10/01/2017  . Elevated troponin I level 11/18/2016  . COPD (chronic obstructive pulmonary disease) with chronic bronchitis ([place]) 08/12/2016  . Asthma without status asthmaticus 03/31/2016  . Osteoporosis 03/31/2016  . Fatty liver determined by biopsy 09/17/2015  . Parotid swelling 09/13/2015  . Urticaria 09/13/2015  . Cerebrovascular accident (CVA) ([place]) 08/21/2015  . Senile purpura ([place]) 08/21/2015  . Atrial tachycardia ([place]) 08/13/2015  . Eczema 07/10/2015  . Allergic rhinitis 06/26/2015  . Hyperkalemia 05/03/2015  . Anxiety 04/23/2015  . Edema leg 03/27/2015  . Primary cardiomyopathy ([place]) 03/05/2015  . CAD (coronary artery disease) 03/05/2015  . Benign essential HTN 02/28/2015  . Combined fat and carbohydrate induced hyperlipemia 09/06/2014  . COPD, severe ([place]) 08/01/2014  . Acid reflux 08/01/2014   Achilles Dunk, OTR/L, CLT  Lovett,Amy 03/12/2018, 6:08 PM  [place] MAIN [facility] SERVICES [address] [place], [place], [address] Phone: [phone]   Fax:  [phone]  Name: Lori Brady MRN: [id number] Date of Birth: [birth year]

## 2018-03-12 NOTE — Therapy (Signed)
[place] [facility] MAIN [facility] SERVICES [address] [place], [place], [address] Phone: [phone]   Fax:  [phone]  Occupational Therapy Treatment  Patient Details  Name: Lori Brady MRN: [id number] Date of Birth: October 19, 1946 Referring Provider: Steele Sizer   Encounter Date: 03/07/2018  OT End of Session - 03/12/18 1618    Visit Number  14    Number of Visits  17    Date for OT Re-Evaluation  03/18/18    Authorization Type  medicare    OT Start Time  1301    OT Stop Time  1400    OT Time Calculation (min)  59 min    Activity Tolerance  Patient tolerated treatment well    Behavior During Therapy  [facility] for tasks assessed/performed       Past Medical History:  Diagnosis Date  . Arthritis   . Asthma   . Crohn's disease ([facility])   . Emphysema of lung ([facility])   . GERD (gastroesophageal reflux disease)   . Hip fracture ([facility]) 01/26/2017  . History of chicken pox   . History of heart attack   . Hx of completed stroke   . Hypercholesteremia   . Hypertension   . Pneumonia   . Seasonal allergies   . Sepsis ([facility])   . Septic shock ([facility]) 10/08/2016  . UTI (lower urinary tract infection)     Past Surgical History:  Procedure Laterality Date  . BREAST BIOPSY Bilateral 1960's to 90's  . CATARACT EXTRACTION, BILATERAL      There were no vitals filed for this visit.  Subjective Assessment - 03/12/18 1613    Subjective   Patient reports she had a good Easter weekend with her family, helped with some of the cooking.  Her family noticed she was walking and moving better.    Pertinent History  Patient reports she fell in the kitchen in March last year and broke her hip.  She was having increased difficulty with movement and decreased balance, she has had 2 injections in her back to help with walking better and she doesn't feel it has helped much. .She was referred to neurology and was diagnosed with Parkinson's in Dec 2018.     Patient Stated Goals  Patient  would like to be able to walk more normal, pick up things from the floor, be able to change her bed.     Currently in Pain?  No/denies    Multiple Pain Sites  No                   OT Treatments/Exercises (OP) - 03/12/18 1614      ADLs   ADL Comments  Functional component tasks established with sit to stand with cues and occasional assist from mat, handwriting for increased legibility and size, manipulation of small objects with cues for prehension patterns, stepping on and off curbs with CGA and cues, negotiatiing stairs with cues and SBA and use of handrails.  Patient working towards legibility with handwriting and formulation of lists, 80% today.      Neurological Re-education Exercises   Other Exercises 1  Patient seen for performance of LSVT BIG exercises: LSVT Daily Session Maximal Daily Exercises: Sustained movements are designed to rescale the amplitude of movement output for generalization to daily functional activities. Performed as follows for 1 set of 10 repetitions each: Multi directional sustained movements- 1) Floor to ceiling, 2) Side to side. Multi directional Repetitive movements performed in standing and  are designed to provide retraining effort needed for sustained muscle activation in tasks Performed as follows: 3) Step and reach forward, 4) Step and Reach Backwards, 5) Step and reach sideways, 6) Rock and reach forward/backward, 7) Rock and reach sideways. For standing exercises, patient required SBA to perform and for balance and exercises performed in a standard technique in the clinic with therapist assist.  Sit to stand from mat table on lowest setting with cues for weight shift, technique and supervision for 10 reps for 1 set, reciprocal stepping patterns with use of one handrail for balance.  Continued with Advanced complexity of exercises to alternating sides in addition to hand flicks this week with maximal daily exercises.      Other Exercises 2  Functional  mobility without assistive device for 2 trials of 500 feet with cues for amplitude of steps and reciprocal arm swing. When distracted, patient tends to shorten her steps              OT Education - 03/12/18 1617    Education provided  Yes    Education Details  amplitude of gait, turns    Person(s) Educated  Patient    Methods  Explanation;Demonstration;Verbal cues    Comprehension  Verbal cues required;Returned demonstration;Verbalized understanding          OT Long Term Goals - 02/26/18 1423      OT LONG TERM GOAL #1   Title  Patient will improve gait speed and endurance and be able to walk 900 feet in 6 minutes to negotiate around the home and community safely in 4 weeks     Baseline  750 at evaluation    Time  4    Period  Weeks    Status  On-going      OT LONG TERM GOAL #2   Title  Patient will complete HEP for maximal daily exercises with modified independence in 4 weeks       Baseline  no current program at eval    Time  4    Period  Weeks    Status  On-going      OT LONG TERM GOAL #3   Title  Patient will transfer from sit to stand without the use of arms safely and independently from a variety of chairs/surfaces in 4 weeks.    Baseline  unable to perform 5 times sit to stand at eval    Time  4    Period  Weeks    Status  On-going      OT LONG TERM GOAL #4   Title  Patient will decrease frequency of freezing episodes with score of 10 or less on Freezing of Gait questionnaire.       Baseline  eval score of 11    Time  4    Period  Weeks    Status  On-going      OT LONG TERM GOAL #5   Title  Patient will perform handwriting of name with 80% legibility.     Baseline  micrographia and poor legibility at eval    Time  4    Period  Weeks    Status  On-going            Plan - 03/12/18 1618    Clinical Impression Statement  Patient continues to make good progress in all areas and has been consistent with performing her exercises on a daily basis twice  a day and during the weekend.She was able  to incorporate increased complexity of her exercises with the addition of hand flicks to her maximal daily exercises and requires cues along with therapist demonstration.continue to work towards calibration of normal movement patterns.     Occupational Profile and client history currently impacting functional performance  balance deficits    Occupational performance deficits (Please refer to evaluation for details):  ADL's;IADL's;Leisure    Rehab Potential  Good    Current Impairments/barriers affecting progress:  positive:  motivation, support/  Negative:  progressive disease process    OT Frequency  4x / week    OT Duration  4 weeks    OT Treatment/Interventions  Self-care/ADL training;Therapeutic exercise;Gait Training;Moist Heat;Neuromuscular education;Stair Training;Patient/family education;Building services engineer;Therapeutic activities;Balance training;DME and/or AE instruction;Manual Therapy    Consulted and Agree with Plan of Care  Patient       Patient will benefit from skilled therapeutic intervention in order to improve the following deficits and impairments:  Abnormal gait, Impaired flexibility, Pain, Decreased coordination, Decreased mobility, Decreased activity tolerance, Decreased endurance, Decreased range of motion, Decreased strength, Decreased balance, Difficulty walking, Impaired UE functional use, Improper body mechanics  Visit Diagnosis: Difficulty in walking, not elsewhere classified  Muscle weakness (generalized)  Other lack of coordination  Unsteadiness on feet    Problem List Patient Active Problem List   Diagnosis Date Noted  . Stroke ([facility]) 03/07/2018  . Primary Parkinsonism ([facility]) 01/05/2018  . SIADH (syndrome of inappropriate ADH production) ([facility]) 10/01/2017  . Spinal stenosis 10/01/2017  . Elevated troponin I level 11/18/2016  . COPD (chronic obstructive pulmonary disease) with chronic bronchitis ([facility])  08/12/2016  . Asthma without status asthmaticus 03/31/2016  . Osteoporosis 03/31/2016  . Fatty liver determined by biopsy 09/17/2015  . Parotid swelling 09/13/2015  . Urticaria 09/13/2015  . Cerebrovascular accident (CVA) ([facility]) 08/21/2015  . Senile purpura ([facility]) 08/21/2015  . Atrial tachycardia ([facility]) 08/13/2015  . Eczema 07/10/2015  . Allergic rhinitis 06/26/2015  . Hyperkalemia 05/03/2015  . Anxiety 04/23/2015  . Edema leg 03/27/2015  . Primary cardiomyopathy ([facility]) 03/05/2015  . CAD (coronary artery disease) 03/05/2015  . Benign essential HTN 02/28/2015  . Combined fat and carbohydrate induced hyperlipemia 09/06/2014  . COPD, severe ([facility]) 08/01/2014  . Acid reflux 08/01/2014   Lori Brady, Lori Brady, Lori Brady  Lori Brady 03/12/2018, 4:19 PM  [place] [facility] MAIN [facility] SERVICES [address] [place], [place], [address] Phone: [phone]   Fax:  [phone]  Name: Lori Brady MRN: [id number] Date of Birth: 27-Aug-1946

## 2018-03-12 NOTE — Therapy (Signed)
[place] MAIN [facility] SERVICES [address] [place], [place], [address] Phone: [phone]   Fax:  [phone]  Occupational Therapy Treatment  Patient Details  Name: Lori Brady MRN: [id number] Date of Birth: Feb 10, 1946 Referring Provider: Joselyn Arrow   Encounter Date: 03/08/2018  OT End of Session - 03/12/18 1635    Visit Number  15    Number of Visits  17    Date for OT Re-Evaluation  03/18/18    Authorization Type  medicare    OT Start Time  6283    OT Stop Time  1355    OT Time Calculation (min)  57 min    Activity Tolerance  Patient tolerated treatment well    Behavior During Therapy  [facility] for tasks assessed/performed       Past Medical History:  Diagnosis Date  . Arthritis   . Asthma   . Crohn's disease ([place])   . Emphysema of lung ([place])   . GERD (gastroesophageal reflux disease)   . Hip fracture ([place]) 01/26/2017  . History of chicken pox   . History of heart attack   . Hx of completed stroke   . Hypercholesteremia   . Hypertension   . Pneumonia   . Seasonal allergies   . Sepsis ([place])   . Septic shock ([place]) 10/08/2016  . UTI (lower urinary tract infection)     Past Surgical History:  Procedure Laterality Date  . BREAST BIOPSY Bilateral 1960's to 90's  . CATARACT EXTRACTION, BILATERAL      There were no vitals filed for this visit.  Subjective Assessment - 03/12/18 1631    Subjective   Patient indicating her hands get tired with the implementation of hand flicks and has worked towards increasing to 10 repetitions with each exercise.      Pertinent History  Patient reports she fell in the kitchen in March last year and broke her hip.  She was having increased difficulty with movement and decreased balance, she has had 2 injections in her back to help with walking better and she doesn't feel it has helped much. .She was referred to neurology and was diagnosed with Parkinson's in Dec 2018.     Patient Stated Goals  Patient  would like to be able to walk more normal, pick up things from the floor, be able to change her bed.     Currently in Pain?  No/denies    Multiple Pain Sites  No                   OT Treatments/Exercises (OP) - 03/12/18 1632      ADLs   ADL Comments  Continued focus on handwriting with lists and emphasis on legiblility, 90% this date.  Sit to stand from a variety of surfaces with occasional cues for weight shift.       Neurological Re-education Exercises   Other Exercises 1  Patient seen for performance of LSVT BIG exercises: LSVT Daily Session Maximal Daily Exercises: Sustained movements are designed to rescale the amplitude of movement output for generalization to daily functional activities. Performed as follows for 1 set of 10 repetitions each: Multi directional sustained movements- 1) Floor to ceiling, 2) Side to side. Multi directional Repetitive movements performed in standing and are designed to provide retraining effort needed for sustained muscle activation in tasks Performed as follows: 3) Step and reach forward, 4) Step and Reach Backwards, 5) Step and reach sideways, 6) Rock and reach  forward/backward, 7) Rock and reach sideways. For standing exercises, patient required SBA to perform and for balance and exercises performed in a standard technique in the clinic with therapist assist.  Sit to stand from mat table on lowest setting with cues for weight shift, technique and supervision for 10 reps for 1 set, reciprocal stepping patterns with use of one handrail for balance.  Continued with Advanced complexity of exercises to alternating sides in addition to hand flicks this week with maximal daily exercises.      Other Exercises 2  Balance tasks in standing with SBA with weight shifts, functional mobility for 2 trials of 550 feet with occasional cues for amplitude of steps to increase.              OT Education - 03/12/18 1635    Education provided  Yes    Education  Details  hand flicks     Person(s) Educated  Patient    Methods  Explanation;Demonstration;Verbal cues    Comprehension  Verbal cues required;Returned demonstration;Verbalized understanding          OT Long Term Goals - 02/26/18 1423      OT LONG TERM GOAL #1   Title  Patient will improve gait speed and endurance and be able to walk 900 feet in 6 minutes to negotiate around the home and community safely in 4 weeks     Baseline  750 at evaluation    Time  4    Period  Weeks    Status  On-going      OT LONG TERM GOAL #2   Title  Patient will complete HEP for maximal daily exercises with modified independence in 4 weeks       Baseline  no current program at eval    Time  4    Period  Weeks    Status  On-going      OT LONG TERM GOAL #3   Title  Patient will transfer from sit to stand without the use of arms safely and independently from a variety of chairs/surfaces in 4 weeks.    Baseline  unable to perform 5 times sit to stand at eval    Time  4    Period  Weeks    Status  On-going      OT LONG TERM GOAL #4   Title  Patient will decrease frequency of freezing episodes with score of 10 or less on Freezing of Gait questionnaire.       Baseline  eval score of 11    Time  4    Period  Weeks    Status  On-going      OT LONG TERM GOAL #5   Title  Patient will perform handwriting of name with 80% legibility.     Baseline  micrographia and poor legibility at eval    Time  4    Period  Weeks    Status  On-going            Plan - 03/12/18 1636    Clinical Impression Statement  Patient continues to work towards improving legibility of handwriting, improved coordination to perform manipulation of small items such as buttons, and to improve balance with standing and functional mobility. She continues to demonstrate difficulty with rock in reach exercise however she performs well with therapist demonstration and cues for timing.    Occupational Profile and client history  currently impacting functional performance  balance deficits    Occupational  performance deficits (Please refer to evaluation for details):  ADL's;IADL's;Leisure    Rehab Potential  Good    Current Impairments/barriers affecting progress:  positive:  motivation, support/  Negative:  progressive disease process    OT Frequency  4x / week    OT Duration  4 weeks    OT Treatment/Interventions  Self-care/ADL training;Therapeutic exercise;Gait Training;Moist Heat;Neuromuscular education;Stair Training;Patient/family education;Therapist, nutritional;Therapeutic activities;Balance training;DME and/or AE instruction;Manual Therapy    Consulted and Agree with Plan of Care  Patient       Patient will benefit from skilled therapeutic intervention in order to improve the following deficits and impairments:  Abnormal gait, Impaired flexibility, Pain, Decreased coordination, Decreased mobility, Decreased activity tolerance, Decreased endurance, Decreased range of motion, Decreased strength, Decreased balance, Difficulty walking, Impaired UE functional use, Improper body mechanics  Visit Diagnosis: Difficulty in walking, not elsewhere classified  Muscle weakness (generalized)  Other lack of coordination  Unsteadiness on feet    Problem List Patient Active Problem List   Diagnosis Date Noted  . Stroke ([place]) 03/07/2018  . Primary Parkinsonism ([place]) 01/05/2018  . SIADH (syndrome of inappropriate ADH production) ([place]) 10/01/2017  . Spinal stenosis 10/01/2017  . Elevated troponin I level 11/18/2016  . COPD (chronic obstructive pulmonary disease) with chronic bronchitis ([place]) 08/12/2016  . Asthma without status asthmaticus 03/31/2016  . Osteoporosis 03/31/2016  . Fatty liver determined by biopsy 09/17/2015  . Parotid swelling 09/13/2015  . Urticaria 09/13/2015  . Cerebrovascular accident (CVA) ([place]) 08/21/2015  . Senile purpura ([place]) 08/21/2015  . Atrial tachycardia ([place]) 08/13/2015  .  Eczema 07/10/2015  . Allergic rhinitis 06/26/2015  . Hyperkalemia 05/03/2015  . Anxiety 04/23/2015  . Edema leg 03/27/2015  . Primary cardiomyopathy ([place]) 03/05/2015  . CAD (coronary artery disease) 03/05/2015  . Benign essential HTN 02/28/2015  . Combined fat and carbohydrate induced hyperlipemia 09/06/2014  . COPD, severe ([place]) 08/01/2014  . Acid reflux 08/01/2014    T Tomasita Morrow, OTR/L, CLT  , 03/12/2018, 4:37 PM  [place] MAIN [facility] SERVICES [address] [place], [place], [address] Phone: [phone]   Fax:  [phone]  Name: Lori Brady MRN: [id number] Date of Birth: [birth year]

## 2018-03-12 NOTE — Therapy (Signed)
[place] MAIN [facility] SERVICES [address] [place], [place], [address] Phone: [phone]   Fax:  [phone]  Occupational Therapy Treatment  Patient Details  Name: Lori Brady MRN: [id number] Date of Birth: July 12, 1946 Referring Provider: Joselyn Arrow   Encounter Date: 03/09/2018  OT End of Session - 03/12/18 1655    Visit Number  16    Number of Visits  17    Date for OT Re-Evaluation  03/18/18    Authorization Type  medicare    OT Start Time  1300    OT Stop Time  1400    OT Time Calculation (min)  60 min    Activity Tolerance  Patient tolerated treatment well    Behavior During Therapy  [facility] for tasks assessed/performed       Past Medical History:  Diagnosis Date  . Arthritis   . Asthma   . Crohn's disease ([place])   . Emphysema of lung ([place])   . GERD (gastroesophageal reflux disease)   . Hip fracture ([place]) 01/26/2017  . History of chicken pox   . History of heart attack   . Hx of completed stroke   . Hypercholesteremia   . Hypertension   . Pneumonia   . Seasonal allergies   . Sepsis ([place])   . Septic shock ([place]) 10/08/2016  . UTI (lower urinary tract infection)     Past Surgical History:  Procedure Laterality Date  . BREAST BIOPSY Bilateral 1960's to 90's  . CATARACT EXTRACTION, BILATERAL      There were no vitals filed for this visit.  Subjective Assessment - 03/12/18 1652    Subjective   Patient states "I cannot believe how fast the time has gone. I am a little sad my therapy is ending this week."    Pertinent History  Patient reports she fell in the kitchen in March last year and broke her hip.  She was having increased difficulty with movement and decreased balance, she has had 2 injections in her back to help with walking better and she doesn't feel it has helped much. .She was referred to neurology and was diagnosed with Parkinson's in Dec 2018.     Patient Stated Goals  Patient would like to be able to walk more  normal, pick up things from the floor, be able to change her bed.     Currently in Pain?  No/denies    Multiple Pain Sites  No                   OT Treatments/Exercises (OP) - 03/12/18 1653      ADLs   ADL Comments  Functional component tasks established with sit to stand with cues and occasional assist from mat, handwriting for increased legibility and size, manipulation of small objects with cues for prehension patterns, stepping on and off curbs with CGA and cues, negotiatiing stairs with cues and SBA and use of handrails.  Patient working towards legibility with handwriting and formulation of lists, 80% today.      Neurological Re-education Exercises   Other Exercises 1  Patient seen for performance of LSVT BIG exercises: LSVT Daily Session Maximal Daily Exercises: Sustained movements are designed to rescale the amplitude of movement output for generalization to daily functional activities. Performed as follows for 1 set of 10 repetitions each: Multi directional sustained movements- 1) Floor to ceiling, 2) Side to side. Multi directional Repetitive movements performed in standing and are designed to provide retraining  effort needed for sustained muscle activation in tasks Performed as follows: 3) Step and reach forward, 4) Step and Reach Backwards, 5) Step and reach sideways, 6) Rock and reach forward/backward, 7) Rock and reach sideways. For standing exercises, patient required SBA to perform and for balance and exercises performed in a standard technique in the clinic with therapist assist.  Sit to stand from mat table on lowest setting with cues for weight shift, technique and supervision for 10 reps for 1 set, reciprocal stepping patterns with use of one handrail for balance.  Continued with Advanced complexity of exercises to alternating sides in addition to hand flicks this week with maximal daily exercises.      Other Exercises 2  Functional mobility for 2 trials of 600 feet, rest  breaks still required due to respiratory status.              OT Education - 03/12/18 1655    Education provided  Yes    Education Details  HEP, hand flicks    Person(s) Educated  Patient    Methods  Explanation;Demonstration;Verbal cues    Comprehension  Verbal cues required;Returned demonstration;Verbalized understanding          OT Long Term Goals - 02/26/18 1423      OT LONG TERM GOAL #1   Title  Patient will improve gait speed and endurance and be able to walk 900 feet in 6 minutes to negotiate around the home and community safely in 4 weeks     Baseline  750 at evaluation    Time  4    Period  Weeks    Status  On-going      OT LONG TERM GOAL #2   Title  Patient will complete HEP for maximal daily exercises with modified independence in 4 weeks       Baseline  no current program at eval    Time  4    Period  Weeks    Status  On-going      OT LONG TERM GOAL #3   Title  Patient will transfer from sit to stand without the use of arms safely and independently from a variety of chairs/surfaces in 4 weeks.    Baseline  unable to perform 5 times sit to stand at eval    Time  4    Period  Weeks    Status  On-going      OT LONG TERM GOAL #4   Title  Patient will decrease frequency of freezing episodes with score of 10 or less on Freezing of Gait questionnaire.       Baseline  eval score of 11    Time  4    Period  Weeks    Status  On-going      OT LONG TERM GOAL #5   Title  Patient will perform handwriting of name with 80% legibility.     Baseline  micrographia and poor legibility at eval    Time  4    Period  Weeks    Status  On-going            Plan - 03/12/18 1655    Clinical Impression Statement  patient has continued to be consistent with performing her exercises at home and has improved with her amplitude of gait, balance, and reciprocal arm swing. She occasionally requires cues for increasing the size of her steps especially when she is  distracted. She is becoming more aware when her steps  shorten and is able to self correct at times. Will plan to discharge patient next date and perform outcome measures.     Occupational Profile and client history currently impacting functional performance  balance deficits    Occupational performance deficits (Please refer to evaluation for details):  ADL's;IADL's;Leisure    Rehab Potential  Good    Current Impairments/barriers affecting progress:  positive:  motivation, support/  Negative:  progressive disease process    OT Frequency  4x / week    OT Duration  4 weeks    OT Treatment/Interventions  Self-care/ADL training;Therapeutic exercise;Gait Training;Moist Heat;Neuromuscular education;Stair Training;Patient/family education;Therapist, nutritional;Therapeutic activities;Balance training;DME and/or AE instruction;Manual Therapy    Consulted and Agree with Plan of Care  Patient       Patient will benefit from skilled therapeutic intervention in order to improve the following deficits and impairments:  Abnormal gait, Impaired flexibility, Pain, Decreased coordination, Decreased mobility, Decreased activity tolerance, Decreased endurance, Decreased range of motion, Decreased strength, Decreased balance, Difficulty walking, Impaired UE functional use, Improper body mechanics  Visit Diagnosis: Difficulty in walking, not elsewhere classified  Muscle weakness (generalized)  Other lack of coordination  Unsteadiness on feet    Problem List Patient Active Problem List   Diagnosis Date Noted  . Stroke ([place]) 03/07/2018  . Primary Parkinsonism ([place]) 01/05/2018  . SIADH (syndrome of inappropriate ADH production) ([place]) 10/01/2017  . Spinal stenosis 10/01/2017  . Elevated troponin I level 11/18/2016  . COPD (chronic obstructive pulmonary disease) with chronic bronchitis ([place]) 08/12/2016  . Asthma without status asthmaticus 03/31/2016  . Osteoporosis 03/31/2016  . Fatty liver  determined by biopsy 09/17/2015  . Parotid swelling 09/13/2015  . Urticaria 09/13/2015  . Cerebrovascular accident (CVA) ([place]) 08/21/2015  . Senile purpura ([place]) 08/21/2015  . Atrial tachycardia ([place]) 08/13/2015  . Eczema 07/10/2015  . Allergic rhinitis 06/26/2015  . Hyperkalemia 05/03/2015  . Anxiety 04/23/2015  . Edema leg 03/27/2015  . Primary cardiomyopathy ([place]) 03/05/2015  . CAD (coronary artery disease) 03/05/2015  . Benign essential HTN 02/28/2015  . Combined fat and carbohydrate induced hyperlipemia 09/06/2014  . COPD, severe ([place]) 08/01/2014  . Acid reflux 08/01/2014   Amy T Tomasita Morrow, OTR/L, CLT Lovett,Amy 03/12/2018, 4:57 PM  [place] MAIN [facility] [place] SERVICES [address] [place], [place], [address] Phone: [phone]   Fax:  [phone]  Name: MADELEIN MAHADEO MRN: [id number] Date of Birth: Apr 11, 1946

## 2018-03-16 ENCOUNTER — Other Ambulatory Visit: Payer: Self-pay | Admitting: Physician Assistant

## 2018-03-17 NOTE — Telephone Encounter (Signed)
Pharmacy requesting refills. Thanks!  

## 2018-03-30 ENCOUNTER — Ambulatory Visit (INDEPENDENT_AMBULATORY_CARE_PROVIDER_SITE_OTHER): Payer: Medicare Other | Admitting: Internal Medicine

## 2018-03-30 DIAGNOSIS — R0602 Shortness of breath: Secondary | ICD-10-CM

## 2018-03-30 LAB — PULMONARY FUNCTION TEST

## 2018-03-30 IMAGING — CR DG FEMUR 2+V*L*
1 series · 4 of 4 positions shown · non-contrast
Comparison: Pelvic and RIGHT hip radiographs 01/26/2017

CLINICAL DATA: LEFT hip and groin pain radiating down LEFT leg
since [REDACTED], no known injury, RIGHT pelvic fractures in January 2017

EXAM:
LEFT FEMUR 2 VIEWS

[Series 1: dg femur min 2 views left · 0.14mm/px · 4 of 4 slices shown]
[im 1/4]
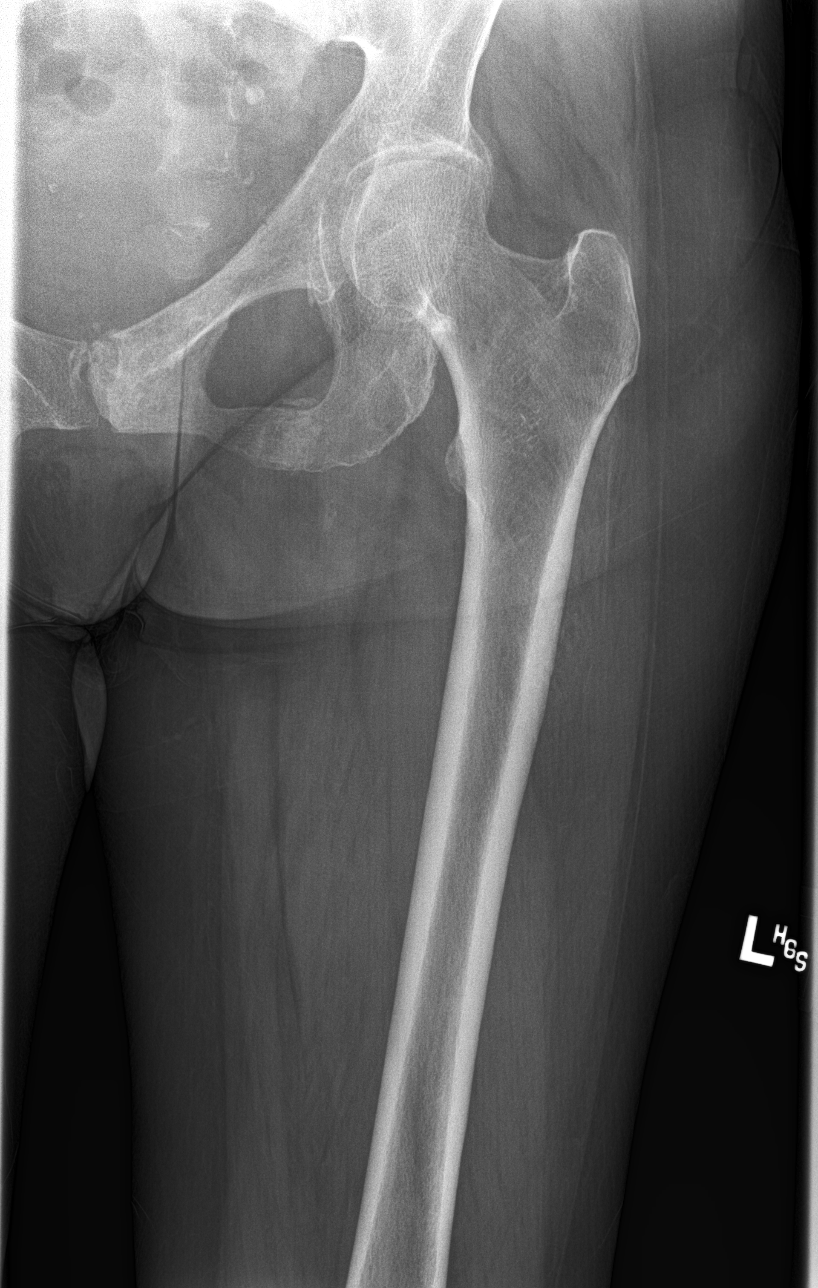
[im 2/4]
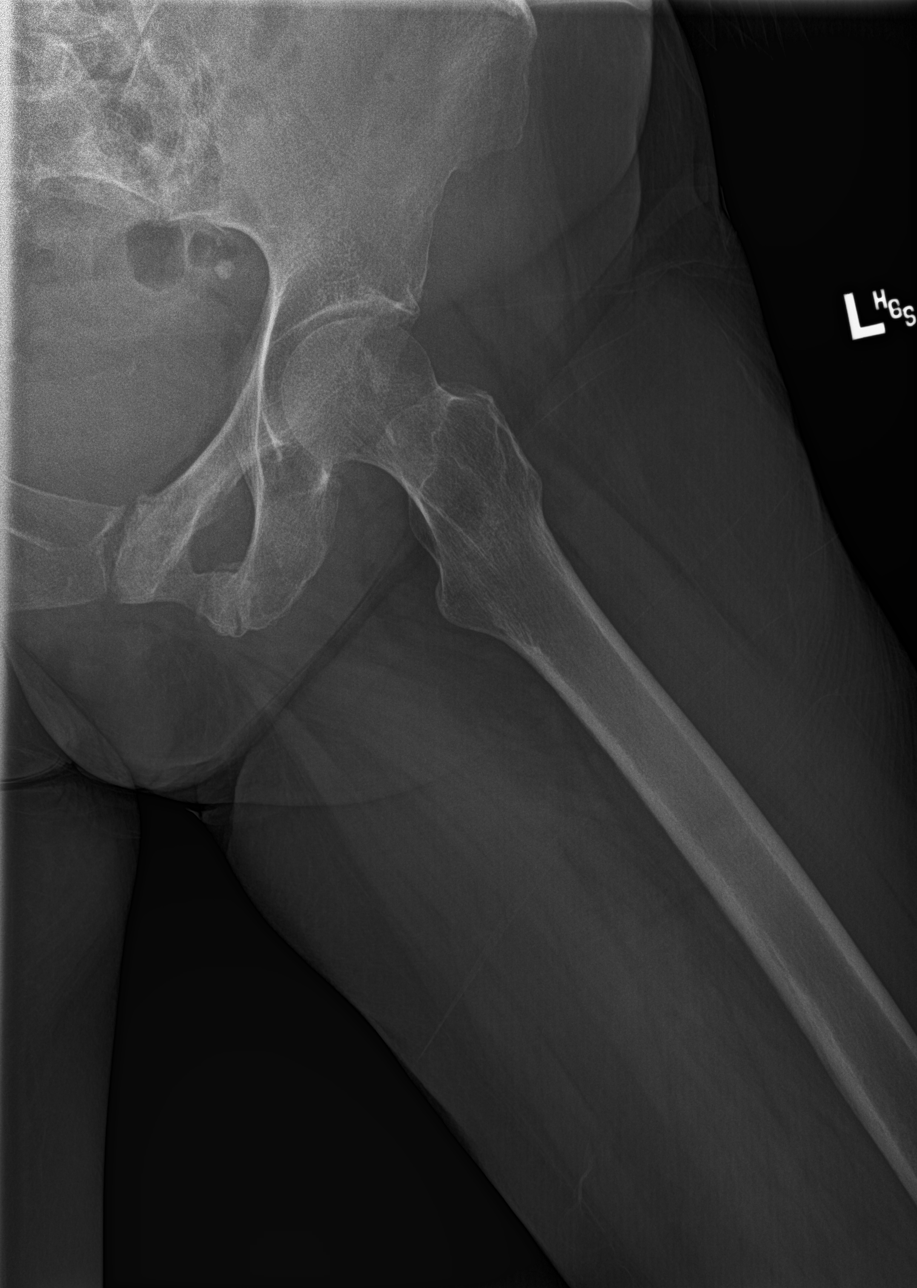
[im 3/4]
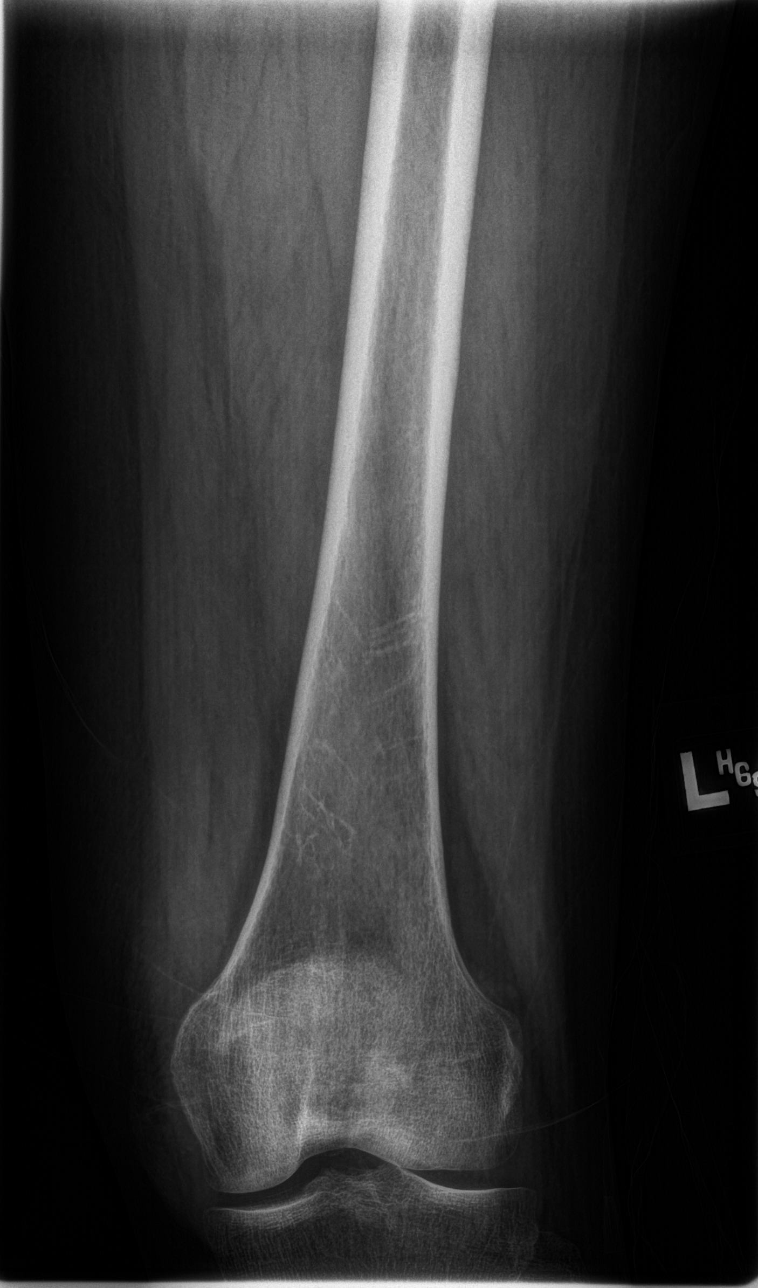
[im 4/4]
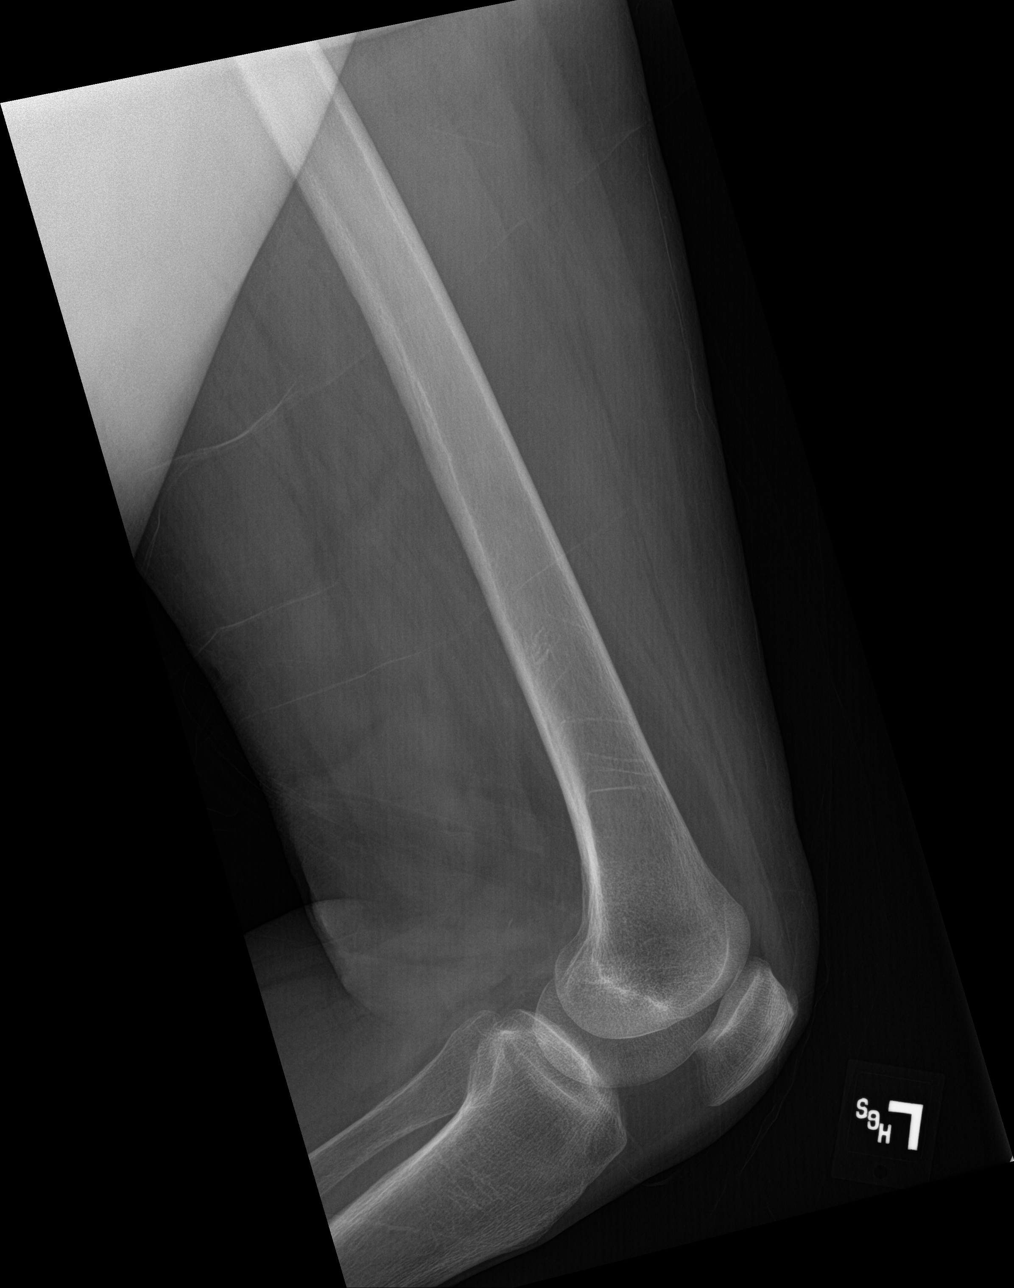

[4 of 4 positions shown; findings below may reference images not displayed]

FINDINGS: Healing fracture RIGHT inferior pubic ramus.

Sclerosis at LEFT pubic body since palpable with healing fracture.

Hip and knee joint spaces preserved.

Bones demineralized.

No acute femoral fracture, dislocation or bone destruction.
IMPRESSION: Healing fractures of the LEFT inferior pubic ramus and LEFT pubic
body.

No acute LEFT femoral abnormalities.

## 2018-04-13 NOTE — Procedures (Signed)
[place], [address]  DATE OF SERVICE: Mar 30, 2018  Complete Pulmonary Function Testing Interpretation:  FINDINGS:  Forced vital capacity is mildly decreased FEV1 is moderately decreased FEV1 FVC ratio is mildly decreased total lung capacity is normal residual volume is normal residual volume total lung capacity ratio is increased FRC is increased.  Postbronchodilator there is no significant improvement in the FEV1 however clinical improvement may occur in the absence of spirometric improvement and does not preclude the use of bronchodilators.  IMPRESSION:  Moderate obstructive lung disease Moderate reduction in the DLCO  clinical correlation is recommended  Allyne Gee, MD [facility] Pulmonary Critical Care Medicine Sleep Medicine

## 2018-06-06 ENCOUNTER — Ambulatory Visit (INDEPENDENT_AMBULATORY_CARE_PROVIDER_SITE_OTHER): Payer: Medicare Other | Admitting: Internal Medicine

## 2018-06-06 ENCOUNTER — Encounter: Payer: Self-pay | Admitting: Internal Medicine

## 2018-06-06 VITALS — BP 148/90 | HR 94 | Resp 16 | Ht 61.0 in | Wt 148.4 lb

## 2018-06-06 DIAGNOSIS — K219 Gastro-esophageal reflux disease without esophagitis: Secondary | ICD-10-CM | POA: Diagnosis not present

## 2018-06-06 DIAGNOSIS — J440 Chronic obstructive pulmonary disease with acute lower respiratory infection: Secondary | ICD-10-CM

## 2018-06-06 DIAGNOSIS — G20 Parkinson's disease: Secondary | ICD-10-CM | POA: Diagnosis not present

## 2018-06-06 DIAGNOSIS — J449 Chronic obstructive pulmonary disease, unspecified: Secondary | ICD-10-CM

## 2018-06-06 DIAGNOSIS — J209 Acute bronchitis, unspecified: Secondary | ICD-10-CM

## 2018-06-06 DIAGNOSIS — I2510 Atherosclerotic heart disease of native coronary artery without angina pectoris: Secondary | ICD-10-CM

## 2018-06-06 DIAGNOSIS — I2583 Coronary atherosclerosis due to lipid rich plaque: Secondary | ICD-10-CM

## 2018-06-06 MED ORDER — SULFAMETHOXAZOLE-TRIMETHOPRIM 800-160 MG PO TABS: 1.0000 | ORAL_TABLET | Freq: Two times a day (BID) | ORAL | 0 refills | Status: DC

## 2018-06-06 NOTE — Patient Instructions (Signed)

## 2018-06-06 NOTE — Progress Notes (Signed)
[facility] [address] [place], [place] [address]  Pulmonary Sleep Medicine   Office Visit Note  Patient Name: Lori Brady DOB: January 17, 1946 MRN [id number]  Date of Service: 06/06/2018  Complaints/HPI:  She is doing fairly well however has noted a cough and some congestion.  She has been bringing up sputum.  States that the color of the sputum is yellow.  It is thick in consistency.  She denies having any hemoptysis.  She states that there is no fevers.  She has noted that this amount sputum increased.  Patient states that she has been using Breo on Spiriva as ordered.  She states that she has been doing very well with Breo.  ROS  General: (-) fever, (-) chills, (-) night sweats, (-) weakness Skin: (-) rashes, (-) itching,. Eyes: (-) visual changes, (-) redness, (-) itching. Nose and Sinuses: (-) nasal stuffiness or itchiness, (-) postnasal drip, (-) nosebleeds, (-) sinus trouble. Mouth and Throat: (-) sore throat, (-) hoarseness. Neck: (-) swollen glands, (-) enlarged thyroid, (-) neck pain. Respiratory: + cough, (-) bloody sputum, + shortness of breath, + wheezing. Cardiovascular: - ankle swelling, (-) chest pain. Lymphatic: (-) lymph node enlargement. Neurologic: (-) numbness, (-) tingling. Psychiatric: (-) anxiety, (-) depression   Current Medication: Outpatient Encounter Medications as of 06/06/2018  Medication Sig Note  . Abaloparatide (TYMLOS) 3120 MCG/1.56ML SOPN Inject into the skin daily.    Marland Kitchen albuterol (ACCUNEB) 0.63 MG/3ML nebulizer solution Take 1 ampule by nebulization every 6 (six) hours as needed for wheezing.   Marland Kitchen albuterol (PROVENTIL HFA;VENTOLIN HFA) 108 (90 Base) MCG/ACT inhaler Inhale 2 puffs into the lungs every 6 (six) hours as needed for wheezing or shortness of breath.   Marland Kitchen aspirin 81 MG tablet Take 81 mg by mouth daily. 01/26/2017: Filled 08/2015  . betamethasone valerate (VALISONE) 0.1 % cream APPLY TOPICALLY TO AFFECTED AREA SPARINGLY 2  TIMES DAILY.   Marland Kitchen BREO ELLIPTA 100-25 MCG/INH AEPB USE 1 PUFF ONCE DAILY AS DIRECTED   . carbidopa-levodopa (SINEMET IR) 25-100 MG tablet Take 2 tablets by mouth 3 (three) times daily.    . cetirizine (ZYRTEC) 10 MG tablet Take 1 tablet (10 mg total) by mouth daily.   . chlorthalidone (HYGROTON) 25 MG tablet Take 1 tablet (25 mg total) by mouth daily.   . Cholecalciferol (VITAMIN D) 2000 UNITS CAPS Take 1 capsule by mouth daily.   . citalopram (CELEXA) 20 MG tablet TAKE 1 TABLET BY MOUTH ONCE DAILY   . FLUAD 0.5 ML SUSY ADM 0.5ML IM UTD   . fluticasone (FLONASE) 50 MCG/ACT nasal spray PLACE TO SPRAYS INTO BOTHS NOSTRILS DAILY   . gabapentin (NEURONTIN) 100 MG capsule Take 100 mg by mouth 3 (three) times daily.   Marland Kitchen guaiFENesin (MUCINEX) 600 MG 12 hr tablet Take 1 tablet (600 mg total) by mouth 2 (two) times daily.   Marland Kitchen ibuprofen (ADVIL,MOTRIN) 200 MG tablet Take 800 mg by mouth every 6 (six) hours as needed.   Marland Kitchen lisinopril (PRINIVIL,ZESTRIL) 20 MG tablet Take 20 mg by mouth daily.    . montelukast (SINGULAIR) 10 MG tablet Take 1 tablet (10 mg total) by mouth daily.   . Omega-3 Fatty Acids (FISH OIL) 1000 MG CAPS Take 1 capsule by mouth daily.   . potassium chloride SA (K-DUR,KLOR-CON) 20 MEQ tablet Take 1 tablet (20 mEq total) by mouth daily.   . simvastatin (ZOCOR) 40 MG tablet TAKE 1 TABLET BY MOUTH AT BEDTIME   . Tdap (BOOSTRIX) 5-2.5-18.5  LF-MCG/0.5 injection Boostrix Tdap 2.5 Lf unit-8 mcg-5 Lf/0.5 mL intramuscular syringe   . Tiotropium Bromide Monohydrate (SPIRIVA RESPIMAT) 2.5 MCG/ACT AERS Inhale 2 puffs into the lungs daily.    No facility-administered encounter medications on file as of 06/06/2018.     Surgical History: Past Surgical History:  Procedure Laterality Date  . BREAST BIOPSY Bilateral 1960's to 90's  . CATARACT EXTRACTION, BILATERAL      Medical History: Past Medical History:  Diagnosis Date  . Arthritis   . Asthma   . Crohn's disease (HCC)   . Emphysema of lung  (HCC)   . GERD (gastroesophageal reflux disease)   . Hip fracture (HCC) 01/26/2017  . History of chicken pox   . History of heart attack   . Hx of completed stroke   . Hypercholesteremia   . Hypertension   . Pneumonia   . Seasonal allergies   . Sepsis (HCC)   . Septic shock (HCC) 10/08/2016  . UTI (lower urinary tract infection)     Family History: Family History  Problem Relation Age of Onset  . Cancer Sister        ovarian/uterine/breast  . Breast cancer Sister 20  . Cancer Sister        breast  . Heart disease Sister   . Breast cancer Sister 85  . Heart disease Father   . Cancer Father        liver  . Cancer Mother   . Heart disease Brother   . Heart attack Brother   . Cancer Brother        liver  . Heart disease Sister   . Diabetes Maternal Grandmother     Social History: Social History   Socioeconomic History  . Marital status: Widowed    Spouse name: Not on file  . Number of children: 3  . Years of education: Not on file  . Highest education level: Some college, no degree  Occupational History  . Occupation: retired  Engineer, production  . Financial resource strain: Not hard at all  . Food insecurity:    Worry: Never true    Inability: Never true  . Transportation needs:    Medical: No    Non-medical: No  Tobacco Use  . Smoking status: Former Smoker    Packs/day: 1.00    Years: 30.00    Pack years: 30.00    Types: Cigarettes    Last attempt to quit: 08/01/2008    Years since quitting: 9.8  . Smokeless tobacco: Never Used  Substance and Sexual Activity  . Alcohol use: Yes    Alcohol/week: 0.6 oz    Types: 1 Shots of liquor per week  . Drug use: No  . Sexual activity: Not on file  Lifestyle  . Physical activity:    Days per week: Not on file    Minutes per session: Not on file  . Stress: Not at all  Relationships  . Social connections:    Talks on phone: Not on file    Gets together: Not on file    Attends religious service: Not on file     Active member of club or organization: Not on file    Attends meetings of clubs or organizations: Not on file    Relationship status: Not on file  . Intimate partner violence:    Fear of current or ex partner: Not on file    Emotionally abused: Not on file    Physically abused: Not on file  Forced sexual activity: Not on file  Other Topics Concern  . Not on file  Social History Narrative  . Not on file    Vital Signs: Blood pressure (!) 148/90, pulse 94, resp. rate 16, height 5\' 1"  (1.549 m), weight 148 lb 6.4 oz (67.3 kg), SpO2 95 %.  Examination: General Appearance: The patient is well-developed, well-nourished, and in no distress. Skin: Gross inspection of skin unremarkable. Head: normocephalic, no gross deformities. Eyes: no gross deformities noted. ENT: ears appear grossly normal no exudates. Neck: Supple. No thyromegaly. No LAD. Respiratory: scattered rhonchi noted. Cardiovascular: Normal S1 and S2 without murmur or rub. Extremities: No cyanosis. pulses are equal. Neurologic: Alert and oriented. No involuntary movements.  LABS: No results found for this or any previous visit (from the past 2160 hour(s)).  Radiology: Mr Brain Wo Contrast  Result Date: 11/03/2017 CLINICAL DATA:  Parkinsonism. White matter disease. Quantitative volume scan requested. EXAM: MRI HEAD WITHOUT CONTRAST TECHNIQUE: Multiplanar, multiecho pulse sequences of the brain and surrounding structures were obtained without intravenous contrast. COMPARISON:  MRI 05/05/2006 FINDINGS: Brain: Diffusion imaging does not show any acute or subacute infarction. There is wall air in degeneration within the left side of the brainstem. The brainstem does not show diffuse small-vessel ischemic changes. No focal cerebellar insult. Cerebral hemispheres show old infarction in the left basal ganglia and radiating white matter tracts. There are moderate chronic small-vessel ischemic changes of the white matter bilaterally,  progressive since 2007. No cortical or large vessel territory infarction. No neoplastic mass lesion, hemorrhage, hydrocephalus or extra-axial collection. There is some hemosiderin deposition in the old left basal ganglia infarction. Susceptibility weighted imaging does not show conclusive findings in the mid brain. One could argue that there is loss of the swallow tail sign on the left. No hydrocephalus. No extra-axial collection. No neoplastic mass lesion. Quantitative imaging was performed using the NeuroQuant product. See report for complete data. Cortical gray matter normative percentile 6%. Cerebral white matter normative percentile 93%. Hippocampi normative percentile 51%. Vascular: Major vessels at the base of the brain show flow. Skull and upper cervical spine: Negative Sinuses/Orbits: Clear/normal Other: None IMPRESSION: No acute finding. Moderate chronic small-vessel ischemic changes of the cerebral hemispheric white matter. Old infarction in the left basal ganglia and radiating white matter tracts with left brainstem wallerian degeneration. NeuroQuant shows primarily cortical volume loss with overall preservation of the white matter volume. Relative hippocampal preservation. Electronically Signed   By: Paulina Fusi M.D.   On: 11/03/2017 14:34    No results found.  No results found.    Assessment and Plan: Patient Active Problem List   Diagnosis Date Noted  . Stroke (HCC) 03/07/2018  . Primary Parkinsonism (HCC) 01/05/2018  . SIADH (syndrome of inappropriate ADH production) (HCC) 10/01/2017  . Spinal stenosis 10/01/2017  . Elevated troponin I level 11/18/2016  . COPD (chronic obstructive pulmonary disease) with chronic bronchitis (HCC) 08/12/2016  . Asthma without status asthmaticus 03/31/2016  . Osteoporosis 03/31/2016  . Fatty liver determined by biopsy 09/17/2015  . Parotid swelling 09/13/2015  . Urticaria 09/13/2015  . Cerebrovascular accident (CVA) (HCC) 08/21/2015  . Senile  purpura (HCC) 08/21/2015  . Atrial tachycardia (HCC) 08/13/2015  . Eczema 07/10/2015  . Allergic rhinitis 06/26/2015  . Hyperkalemia 05/03/2015  . Anxiety 04/23/2015  . Edema leg 03/27/2015  . Primary cardiomyopathy (HCC) 03/05/2015  . CAD (coronary artery disease) 03/05/2015  . Benign essential HTN 02/28/2015  . Combined fat and carbohydrate induced hyperlipemia 09/06/2014  . COPD,  severe (HCC) 08/01/2014  . Acid reflux 08/01/2014    1. COPD moderate by the last PFT. She is on proair spiriva and breo. Has some yellow sputum noted 2. Acute bronchitis will give script for bactrim DS 3. CAD seen by cardiology and has been deemed stable 4. GERD controlled on nexium 5. Parkinsons Disease controlled on medications. She sees Dr Sherryll Burger for this  General Counseling: I have discussed the findings of the evaluation and examination with Lori Brady.  I have also discussed any further diagnostic evaluation thatmay be needed or ordered today. Lori Brady verbalizes understanding of the findings of todays visit. We also reviewed her medications today and discussed drug interactions and side effects including but not limited excessive drowsiness and altered mental states. We also discussed that there is always a risk not just to her but also people around her. she has been encouraged to call the office with any questions or concerns that should arise related to todays visit.    Time spent:  I have personally obtained a history, examined the patient, evaluated laboratory and imaging results, formulated the assessment and plan and placed orders.    Yevonne Pax, MD [facility] Pulmonary and Critical Care Sleep medicine

## 2018-07-04 ENCOUNTER — Ambulatory Visit: Payer: Medicare Other | Admitting: Physician Assistant

## 2018-07-08 ENCOUNTER — Encounter: Payer: Self-pay | Admitting: Physician Assistant

## 2018-07-08 ENCOUNTER — Ambulatory Visit: Payer: Medicare Other | Admitting: Physician Assistant

## 2018-07-08 VITALS — BP 110/68 | HR 94 | Temp 98.6°F | Resp 16 | Wt 151.0 lb

## 2018-07-08 DIAGNOSIS — G20 Parkinson's disease: Secondary | ICD-10-CM | POA: Diagnosis not present

## 2018-07-08 DIAGNOSIS — G20C Parkinsonism, unspecified: Secondary | ICD-10-CM

## 2018-07-08 DIAGNOSIS — I10 Essential (primary) hypertension: Secondary | ICD-10-CM

## 2018-07-08 DIAGNOSIS — J449 Chronic obstructive pulmonary disease, unspecified: Secondary | ICD-10-CM

## 2018-07-08 DIAGNOSIS — F419 Anxiety disorder, unspecified: Secondary | ICD-10-CM

## 2018-07-08 NOTE — Progress Notes (Signed)
Patient: Lori Brady Female    DOB: July 11, 1946   72 y.o.   MRN: [id number] Visit Date: 07/08/2018  Today's Provider: Trey Sailors, PA-C   Chief Complaint  Patient presents with  . Hypertension  . COPD   Subjective:    HPI  Hypertension, follow-up:  BP Readings from Last 3 Encounters:  07/08/18 110/68  06/06/18 (!) 148/90  03/07/18 (!) 110/58    She was last seen for hypertension 6 months ago.  BP at that visit was 128/64. Management changes since that visit include no changes. She reports excellent compliance with treatment. She is on Lisinopril 20 mg daily and chlorthalidone 25 mg daily. She is on this from cardiology.  She is not having side effects. She did have one episode she reports after a diarrheal illness where she felt dizzy and fell. She did not take her BP at this time. She called her neurologist who told her to take her blood pressure if that happened again.  She is exercising. She is adherent to low salt diet.   Outside blood pressures are stable. She is experiencing none.  Patient denies chest pain.   Cardiovascular risk factors include advanced age (older than 78 for men, 67 for women) and hypertension.  Use of agents associated with hypertension: none.     Weight trend: stable Wt Readings from Last 3 Encounters:  07/08/18 151 lb (68.5 kg)  06/06/18 148 lb 6.4 oz (67.3 kg)  03/07/18 150 lb 9.6 oz (68.3 kg)    Current diet: in general, a "healthy" diet    ------------------------------------------------------------------------  Follow up for COPD  The patient was last seen for this 6 months ago. Changes made at last visit include no changes.  She reports excellent compliance with treatment. She feels that condition is Improved. She is not having side effects.   Depression: Currently taking Celexa 20 mg. Doing well, reports good moods.  Parkinson's Disease: Sees Dr. Sherryll Burger at neurology and is on Sinemet.    ------------------------------------------------------------------------------------        Allergies  Allergen Reactions  . Levaquin [Levofloxacin] Nausea Only  . Vicodin [Hydrocodone-Acetaminophen] Nausea Only     Current Outpatient Medications:  .  Abaloparatide (TYMLOS) 3120 MCG/1.56ML SOPN, Inject into the skin daily. , Disp: , Rfl:  .  albuterol (ACCUNEB) 0.63 MG/3ML nebulizer solution, Take 1 ampule by nebulization every 6 (six) hours as needed for wheezing., Disp: , Rfl:  .  albuterol (PROVENTIL HFA;VENTOLIN HFA) 108 (90 Base) MCG/ACT inhaler, Inhale 2 puffs into the lungs every 6 (six) hours as needed for wheezing or shortness of breath., Disp: 1 Inhaler, Rfl: 0 .  aspirin 81 MG tablet, Take 81 mg by mouth daily., Disp: , Rfl:  .  betamethasone valerate (VALISONE) 0.1 % cream, APPLY TOPICALLY TO AFFECTED AREA SPARINGLY 2 TIMES DAILY., Disp: 15 g, Rfl: 5 .  BREO ELLIPTA 100-25 MCG/INH AEPB, USE 1 PUFF ONCE DAILY AS DIRECTED, Disp: 180 each, Rfl: 1 .  cetirizine (ZYRTEC) 10 MG tablet, Take 1 tablet (10 mg total) by mouth daily., Disp: 30 tablet, Rfl: 11 .  chlorthalidone (HYGROTON) 25 MG tablet, Take 1 tablet (25 mg total) by mouth daily., Disp: 30 tablet, Rfl: 0 .  Cholecalciferol (VITAMIN D) 2000 UNITS CAPS, Take 1 capsule by mouth daily., Disp: , Rfl:  .  citalopram (CELEXA) 20 MG tablet, TAKE 1 TABLET BY MOUTH ONCE DAILY, Disp: 90 tablet, Rfl: 1 .  fluticasone (FLONASE) 50 MCG/ACT nasal spray,  PLACE TO SPRAYS INTO BOTHS NOSTRILS DAILY, Disp: 16 g, Rfl: 6 .  gabapentin (NEURONTIN) 100 MG capsule, Take 100 mg by mouth 3 (three) times daily., Disp: , Rfl:  .  ibuprofen (ADVIL,MOTRIN) 200 MG tablet, Take 800 mg by mouth every 6 (six) hours as needed., Disp: , Rfl:  .  lisinopril (PRINIVIL,ZESTRIL) 20 MG tablet, Take 20 mg by mouth daily. , Disp: , Rfl:  .  montelukast (SINGULAIR) 10 MG tablet, Take 1 tablet (10 mg total) by mouth daily., Disp: 90 tablet, Rfl: 1 .  Omega-3 Fatty  Acids (FISH OIL) 1000 MG CAPS, Take 1 capsule by mouth daily., Disp: , Rfl:  .  potassium chloride SA (K-DUR,KLOR-CON) 20 MEQ tablet, Take 1 tablet (20 mEq total) by mouth daily., Disp: 60 tablet, Rfl: 5 .  simvastatin (ZOCOR) 40 MG tablet, TAKE 1 TABLET BY MOUTH AT BEDTIME, Disp: 90 tablet, Rfl: 3 .  Tiotropium Bromide Monohydrate (SPIRIVA RESPIMAT) 2.5 MCG/ACT AERS, Inhale 2 puffs into the lungs daily., Disp: , Rfl:  .  carbidopa-levodopa (SINEMET IR) 25-100 MG tablet, Take 2 tablets by mouth 3 (three) times daily. , Disp: , Rfl:   Review of Systems  Constitutional: Negative.   Respiratory: Negative.   Cardiovascular: Negative.     Social History   Tobacco Use  . Smoking status: Former Smoker    Packs/day: 1.00    Years: 30.00    Pack years: 30.00    Types: Cigarettes    Last attempt to quit: 08/01/2008    Years since quitting: 9.9  . Smokeless tobacco: Never Used  Substance Use Topics  . Alcohol use: Yes    Alcohol/week: 1.0 standard drinks    Types: 1 Shots of liquor per week   Objective:   BP 110/68 (BP Location: Left Arm, Patient Position: Sitting, Cuff Size: Normal)   Pulse 94   Temp 98.6 F (37 C) (Oral)   Resp 16   Wt 151 lb (68.5 kg)   SpO2 97%   BMI 28.53 kg/m  Vitals:   07/08/18 1211  BP: 110/68  Pulse: 94  Resp: 16  Temp: 98.6 F (37 C)  TempSrc: Oral  SpO2: 97%  Weight: 151 lb (68.5 kg)     Physical Exam  Constitutional: She is oriented to person, place, and time. She appears well-developed and well-nourished.  Cardiovascular: Normal rate and regular rhythm.  Pulmonary/Chest: Effort normal and breath sounds normal.  Musculoskeletal: Normal range of motion.  Neurological: She is alert and oriented to person, place, and time.  Skin: Skin is warm and dry.  Psychiatric: She has a normal mood and affect. Her behavior is normal.        Assessment & Plan:     1. Benign essential HTN  May decrease medications if she has another syncopal  episode.  2. Primary Parkinsonism (HCC)  Followed by Dr. Sherryll Burger.   3. Anxiety  Celexa 20 mg continue.   4. COPD, severe (HCC)  Sees Dr. Welton Flakes.   Return if symptoms worsen or fail to improve.  The entirety of the information documented in the History of Present Illness, Review of Systems and Physical Exam were personally obtained by me. Portions of this information were initially documented by Rondel Baton, CMA and reviewed by me for thoroughness and accuracy.          Trey Sailors, PA-C  [facility]

## 2018-07-16 ENCOUNTER — Emergency Department: Payer: Medicare Other

## 2018-07-16 ENCOUNTER — Inpatient Hospital Stay
Admission: EM | Admit: 2018-07-16 | Discharge: 2018-07-18 | DRG: 682 | Disposition: A | Discharge status: Home Health | Payer: Medicare Other | Attending: Internal Medicine | Admitting: Internal Medicine

## 2018-07-16 DIAGNOSIS — K5090 Crohn's disease, unspecified, without complications: Secondary | ICD-10-CM | POA: Diagnosis present

## 2018-07-16 DIAGNOSIS — I10 Essential (primary) hypertension: Secondary | ICD-10-CM | POA: Diagnosis present

## 2018-07-16 DIAGNOSIS — I428 Other cardiomyopathies: Secondary | ICD-10-CM | POA: Diagnosis present

## 2018-07-16 DIAGNOSIS — I252 Old myocardial infarction: Secondary | ICD-10-CM | POA: Diagnosis not present

## 2018-07-16 DIAGNOSIS — F419 Anxiety disorder, unspecified: Secondary | ICD-10-CM | POA: Diagnosis present

## 2018-07-16 DIAGNOSIS — J302 Other seasonal allergic rhinitis: Secondary | ICD-10-CM | POA: Diagnosis present

## 2018-07-16 DIAGNOSIS — K219 Gastro-esophageal reflux disease without esophagitis: Secondary | ICD-10-CM | POA: Diagnosis present

## 2018-07-16 DIAGNOSIS — Z8673 Personal history of transient ischemic attack (TIA), and cerebral infarction without residual deficits: Secondary | ICD-10-CM | POA: Diagnosis not present

## 2018-07-16 DIAGNOSIS — Z8249 Family history of ischemic heart disease and other diseases of the circulatory system: Secondary | ICD-10-CM

## 2018-07-16 DIAGNOSIS — J9601 Acute respiratory failure with hypoxia: Secondary | ICD-10-CM | POA: Diagnosis present

## 2018-07-16 DIAGNOSIS — J441 Chronic obstructive pulmonary disease with (acute) exacerbation: Secondary | ICD-10-CM | POA: Diagnosis present

## 2018-07-16 DIAGNOSIS — N390 Urinary tract infection, site not specified: Secondary | ICD-10-CM | POA: Diagnosis present

## 2018-07-16 DIAGNOSIS — I2510 Atherosclerotic heart disease of native coronary artery without angina pectoris: Secondary | ICD-10-CM | POA: Diagnosis present

## 2018-07-16 DIAGNOSIS — E876 Hypokalemia: Secondary | ICD-10-CM

## 2018-07-16 DIAGNOSIS — Z87891 Personal history of nicotine dependence: Secondary | ICD-10-CM

## 2018-07-16 DIAGNOSIS — Z7951 Long term (current) use of inhaled steroids: Secondary | ICD-10-CM

## 2018-07-16 DIAGNOSIS — Z885 Allergy status to narcotic agent status: Secondary | ICD-10-CM

## 2018-07-16 DIAGNOSIS — R0602 Shortness of breath: Secondary | ICD-10-CM

## 2018-07-16 DIAGNOSIS — I959 Hypotension, unspecified: Secondary | ICD-10-CM | POA: Diagnosis not present

## 2018-07-16 DIAGNOSIS — E871 Hypo-osmolality and hyponatremia: Secondary | ICD-10-CM | POA: Diagnosis present

## 2018-07-16 DIAGNOSIS — R109 Unspecified abdominal pain: Secondary | ICD-10-CM

## 2018-07-16 DIAGNOSIS — G20 Parkinson's disease: Secondary | ICD-10-CM | POA: Diagnosis present

## 2018-07-16 DIAGNOSIS — R112 Nausea with vomiting, unspecified: Secondary | ICD-10-CM

## 2018-07-16 DIAGNOSIS — N179 Acute kidney failure, unspecified: Secondary | ICD-10-CM | POA: Diagnosis not present

## 2018-07-16 DIAGNOSIS — M810 Age-related osteoporosis without current pathological fracture: Secondary | ICD-10-CM | POA: Diagnosis present

## 2018-07-16 DIAGNOSIS — E7800 Pure hypercholesterolemia, unspecified: Secondary | ICD-10-CM | POA: Diagnosis present

## 2018-07-16 DIAGNOSIS — R197 Diarrhea, unspecified: Secondary | ICD-10-CM

## 2018-07-16 DIAGNOSIS — K760 Fatty (change of) liver, not elsewhere classified: Secondary | ICD-10-CM | POA: Diagnosis present

## 2018-07-16 DIAGNOSIS — Z9841 Cataract extraction status, right eye: Secondary | ICD-10-CM

## 2018-07-16 DIAGNOSIS — K529 Noninfective gastroenteritis and colitis, unspecified: Secondary | ICD-10-CM | POA: Diagnosis present

## 2018-07-16 DIAGNOSIS — Z881 Allergy status to other antibiotic agents status: Secondary | ICD-10-CM

## 2018-07-16 DIAGNOSIS — Z9842 Cataract extraction status, left eye: Secondary | ICD-10-CM

## 2018-07-16 DIAGNOSIS — Z7982 Long term (current) use of aspirin: Secondary | ICD-10-CM

## 2018-07-16 DIAGNOSIS — E860 Dehydration: Secondary | ICD-10-CM

## 2018-07-16 DIAGNOSIS — Z79899 Other long term (current) drug therapy: Secondary | ICD-10-CM

## 2018-07-16 HISTORY — DX: Parkinson's disease: G20

## 2018-07-16 HISTORY — DX: Parkinson's disease without dyskinesia, without mention of fluctuations: G20.A1

## 2018-07-16 LAB — TROPONIN I: Troponin I: <0.03 ng/mL (ref <0.03)

## 2018-07-16 LAB — COMPREHENSIVE METABOLIC PANEL
ALK PHOS: 103 U/L (ref 38–126)
ALT: 7 U/L (ref 0–44)
AST: 44 U/L — ABNORMAL HIGH (ref 15–41)
Albumin: 3.9 g/dL (ref 3.5–5.0)
Anion gap: 15 (ref 5–15)
BILIRUBIN TOTAL: 1.7 mg/dL — AB (ref 0.3–1.2)
BUN: 22 mg/dL (ref 8–23)
CALCIUM: 8.7 mg/dL — AB (ref 8.9–10.3)
CO2: 21 mmol/L — ABNORMAL LOW (ref 22–32)
Chloride: 92 mmol/L — ABNORMAL LOW (ref 98–111)
Creatinine, Ser: 1.12 mg/dL — ABNORMAL HIGH (ref 0.44–1.00)
GFR calc non Af Amer: 49 mL/min — ABNORMAL LOW (ref >60)
GFR, EST AFRICAN AMERICAN: 56 mL/min — AB (ref >60)
Glucose, Bld: 121 mg/dL — ABNORMAL HIGH (ref 70–99)
Potassium: 3.3 mmol/L — ABNORMAL LOW (ref 3.5–5.1)
SODIUM: 128 mmol/L — AB (ref 135–145)
Total Protein: 6.9 g/dL (ref 6.5–8.1)

## 2018-07-16 LAB — CBC WITH DIFFERENTIAL/PLATELET
BASOS PCT: 0 %
Basophils Absolute: 0 10*3/uL (ref 0–0.1)
EOS ABS: 0 10*3/uL (ref 0–0.7)
EOS PCT: 0 %
HCT: 30.1 % — ABNORMAL LOW (ref 35.0–47.0)
HEMOGLOBIN: 10.1 g/dL — AB (ref 12.0–16.0)
Lymphocytes Relative: 1 %
Lymphs Abs: 0.1 10*3/uL — ABNORMAL LOW (ref 1.0–3.6)
MCH: 31.8 pg (ref 26.0–34.0)
MCHC: 33.7 g/dL (ref 32.0–36.0)
MCV: 94.6 fL (ref 80.0–100.0)
Monocytes Absolute: 0.2 10*3/uL (ref 0.2–0.9)
Monocytes Relative: 2 %
NEUTROS PCT: 97 %
Neutro Abs: 9 10*3/uL — ABNORMAL HIGH (ref 1.4–6.5)
PLATELETS: 124 10*3/uL — AB (ref 150–440)
RBC: 3.18 MIL/uL — ABNORMAL LOW (ref 3.80–5.20)
RDW: 12.7 % (ref 11.5–14.5)
WBC: 9.3 10*3/uL (ref 3.6–11.0)

## 2018-07-16 LAB — BASIC METABOLIC PANEL
Anion gap: 13 (ref 5–15)
BUN: 30 mg/dL — AB (ref 8–23)
CALCIUM: 7.9 mg/dL — AB (ref 8.9–10.3)
CHLORIDE: 95 mmol/L — AB (ref 98–111)
CO2: 21 mmol/L — ABNORMAL LOW (ref 22–32)
Creatinine, Ser: 1.94 mg/dL — ABNORMAL HIGH (ref 0.44–1.00)
GFR, EST AFRICAN AMERICAN: 29 mL/min — AB (ref >60)
GFR, EST NON AFRICAN AMERICAN: 25 mL/min — AB (ref >60)
Glucose, Bld: 120 mg/dL — ABNORMAL HIGH (ref 70–99)
Potassium: 3.4 mmol/L — ABNORMAL LOW (ref 3.5–5.1)
SODIUM: 129 mmol/L — AB (ref 135–145)

## 2018-07-16 LAB — URINE DRUG SCREEN, QUALITATIVE (ARMC ONLY)
Amphetamines, Ur Screen: NOT DETECTED
BARBITURATES, UR SCREEN: NOT DETECTED
CANNABINOID 50 NG, UR ~~LOC~~: NOT DETECTED
COCAINE METABOLITE, UR ~~LOC~~: NOT DETECTED
MDMA (Ecstasy)Ur Screen: NOT DETECTED
METHADONE SCREEN, URINE: NOT DETECTED
Opiate, Ur Screen: NOT DETECTED
Phencyclidine (PCP) Ur S: NOT DETECTED
TRICYCLIC, UR SCREEN: NOT DETECTED

## 2018-07-16 LAB — URINALYSIS, COMPLETE (UACMP) WITH MICROSCOPIC
BILIRUBIN URINE: NEGATIVE
Glucose, UA: NEGATIVE mg/dL
Hgb urine dipstick: NEGATIVE
KETONES UR: 5 mg/dL — AB
Nitrite: NEGATIVE
PROTEIN: 100 mg/dL — AB
Specific Gravity, Urine: 1.015 (ref 1.005–1.030)
pH: 5 (ref 5.0–8.0)

## 2018-07-16 LAB — LIPASE, BLOOD: Lipase: 31 U/L (ref 11–51)

## 2018-07-16 MED ORDER — SODIUM CHLORIDE 0.9 % IV BOLUS
1000.0000 mL | Freq: Once | INTRAVENOUS | Status: AC
  Administered 2018-07-16: 1000 mL via INTRAVENOUS

## 2018-07-16 MED ORDER — ONDANSETRON HCL 4 MG/2ML IJ SOLN
4.0000 mg | Freq: Once | INTRAMUSCULAR | Status: AC
  Administered 2018-07-16: 4 mg via INTRAVENOUS
  Filled 2018-07-16: qty 2

## 2018-07-16 MED ORDER — MONTELUKAST SODIUM 10 MG PO TABS
10.0000 mg | ORAL_TABLET | Freq: Every day | ORAL | Status: DC
  Administered 2018-07-16 – 2018-07-17 (×2): 10 mg via ORAL
  Filled 2018-07-16 (×2): qty 1

## 2018-07-16 MED ORDER — METHYLPREDNISOLONE SODIUM SUCC 125 MG IJ SOLR
125.0000 mg | INTRAMUSCULAR | Status: AC
  Administered 2018-07-16: 125 mg via INTRAVENOUS

## 2018-07-16 MED ORDER — SODIUM CHLORIDE 0.9 % IV SOLN
1.0000 g | INTRAVENOUS | Status: DC
  Administered 2018-07-16 – 2018-07-17 (×2): 1 g via INTRAVENOUS
  Filled 2018-07-16: qty 1

## 2018-07-16 MED ORDER — ENOXAPARIN SODIUM 40 MG/0.4ML ~~LOC~~ SOLN
SUBCUTANEOUS | Status: AC
  Filled 2018-07-16: qty 0.4

## 2018-07-16 MED ORDER — CARBIDOPA-LEVODOPA 25-100 MG PO TABS
2.0000 | ORAL_TABLET | Freq: Three times a day (TID) | ORAL | Status: DC
  Administered 2018-07-16 – 2018-07-18 (×5): 2 via ORAL
  Filled 2018-07-16 (×8): qty 2

## 2018-07-16 MED ORDER — POTASSIUM CHLORIDE CRYS ER 20 MEQ PO TBCR: 20.0000 meq | EXTENDED_RELEASE_TABLET | Freq: Every day | ORAL | Status: DC

## 2018-07-16 MED ORDER — ACETAMINOPHEN 325 MG PO TABS: 650.0000 mg | ORAL_TABLET | Freq: Four times a day (QID) | ORAL | Status: DC | PRN

## 2018-07-16 MED ORDER — FLUTICASONE PROPIONATE 50 MCG/ACT NA SUSP
1.0000 | Freq: Every day | NASAL | Status: DC | PRN
  Filled 2018-07-16: qty 16

## 2018-07-16 MED ORDER — ONDANSETRON HCL 4 MG PO TABS: 4.0000 mg | ORAL_TABLET | Freq: Four times a day (QID) | ORAL | Status: DC | PRN

## 2018-07-16 MED ORDER — SODIUM CHLORIDE 0.9 % IV SOLN: INTRAVENOUS | Status: DC

## 2018-07-16 MED ORDER — METRONIDAZOLE IN NACL 5-0.79 MG/ML-% IV SOLN
500.0000 mg | Freq: Three times a day (TID) | INTRAVENOUS | Status: DC
  Administered 2018-07-16 – 2018-07-17 (×3): 500 mg via INTRAVENOUS
  Filled 2018-07-16 (×3): qty 100

## 2018-07-16 MED ORDER — ENOXAPARIN SODIUM 40 MG/0.4ML ~~LOC~~ SOLN
40.0000 mg | SUBCUTANEOUS | Status: DC
  Administered 2018-07-16 – 2018-07-17 (×2): 40 mg via SUBCUTANEOUS
  Filled 2018-07-16: qty 0.4

## 2018-07-16 MED ORDER — MAGNESIUM SULFATE 2 GM/50ML IV SOLN: 2.0000 g | Freq: Once | INTRAVENOUS | Status: DC

## 2018-07-16 MED ORDER — CITALOPRAM HYDROBROMIDE 20 MG PO TABS
20.0000 mg | ORAL_TABLET | Freq: Every day | ORAL | Status: DC
  Administered 2018-07-17 – 2018-07-18 (×2): 20 mg via ORAL
  Filled 2018-07-16 (×2): qty 1

## 2018-07-16 MED ORDER — IPRATROPIUM-ALBUTEROL 0.5-2.5 (3) MG/3ML IN SOLN
3.0000 mL | Freq: Four times a day (QID) | RESPIRATORY_TRACT | Status: DC
  Administered 2018-07-16 – 2018-07-18 (×5): 3 mL via RESPIRATORY_TRACT
  Filled 2018-07-16 (×5): qty 3

## 2018-07-16 MED ORDER — CEFTRIAXONE SODIUM 1 G IJ SOLR
INTRAMUSCULAR | Status: AC
  Filled 2018-07-16: qty 10

## 2018-07-16 MED ORDER — LACTATED RINGERS IV BOLUS
1000.0000 mL | Freq: Once | INTRAVENOUS | Status: AC
  Administered 2018-07-16: 1000 mL via INTRAVENOUS

## 2018-07-16 MED ORDER — IPRATROPIUM-ALBUTEROL 0.5-2.5 (3) MG/3ML IN SOLN
3.0000 mL | Freq: Once | RESPIRATORY_TRACT | Status: AC
  Administered 2018-07-16: 3 mL via RESPIRATORY_TRACT
  Filled 2018-07-16: qty 3

## 2018-07-16 MED ORDER — METHYLPREDNISOLONE SODIUM SUCC 125 MG IJ SOLR
INTRAMUSCULAR | Status: AC
  Filled 2018-07-16: qty 2

## 2018-07-16 MED ORDER — POTASSIUM CHLORIDE IN NACL 20-0.9 MEQ/L-% IV SOLN
INTRAVENOUS | Status: DC
  Administered 2018-07-16: 21:00:00 via INTRAVENOUS
  Filled 2018-07-16 (×2): qty 1000

## 2018-07-16 MED ORDER — ACETAMINOPHEN 650 MG RE SUPP: 650.0000 mg | Freq: Four times a day (QID) | RECTAL | Status: DC | PRN

## 2018-07-16 MED ORDER — ASPIRIN EC 81 MG PO TBEC
81.0000 mg | DELAYED_RELEASE_TABLET | Freq: Every day | ORAL | Status: DC
  Administered 2018-07-17 – 2018-07-18 (×2): 81 mg via ORAL
  Filled 2018-07-16 (×2): qty 1

## 2018-07-16 MED ORDER — CARBIDOPA-LEVODOPA ER 50-200 MG PO TBCR
1.0000 | EXTENDED_RELEASE_TABLET | Freq: Every day | ORAL | Status: DC
  Administered 2018-07-17: 1 via ORAL
  Filled 2018-07-16 (×2): qty 1

## 2018-07-16 MED ORDER — PROMETHAZINE HCL 25 MG/ML IJ SOLN
12.5000 mg | Freq: Once | INTRAMUSCULAR | Status: AC
  Administered 2018-07-16: 12.5 mg via INTRAVENOUS
  Filled 2018-07-16: qty 1

## 2018-07-16 MED ORDER — METHYLPREDNISOLONE SODIUM SUCC 40 MG IJ SOLR
40.0000 mg | Freq: Two times a day (BID) | INTRAMUSCULAR | Status: DC
  Administered 2018-07-17 (×2): 40 mg via INTRAVENOUS
  Filled 2018-07-16 (×2): qty 1

## 2018-07-16 MED ORDER — METRONIDAZOLE IN NACL 5-0.79 MG/ML-% IV SOLN
INTRAVENOUS | Status: AC
  Administered 2018-07-16: 500 mg via INTRAVENOUS
  Filled 2018-07-16: qty 100

## 2018-07-16 MED ORDER — ONDANSETRON HCL 4 MG/2ML IJ SOLN: 4.0000 mg | Freq: Four times a day (QID) | INTRAMUSCULAR | Status: DC | PRN

## 2018-07-16 MED ORDER — SIMVASTATIN 10 MG PO TABS
40.0000 mg | ORAL_TABLET | Freq: Every day | ORAL | Status: DC
  Administered 2018-07-16 – 2018-07-17 (×2): 40 mg via ORAL
  Filled 2018-07-16 (×3): qty 4

## 2018-07-16 MED ORDER — BUDESONIDE 0.5 MG/2ML IN SUSP
0.5000 mg | Freq: Two times a day (BID) | RESPIRATORY_TRACT | Status: DC
  Administered 2018-07-16 – 2018-07-18 (×4): 0.5 mg via RESPIRATORY_TRACT
  Filled 2018-07-16 (×4): qty 2

## 2018-07-16 NOTE — ED Provider Notes (Signed)
[facility] Emergency Department Provider Note   ____________________________________________   First MD Initiated Contact with Patient 07/16/18 [phone]     (approximate)  I have reviewed the triage vital signs and the nursing notes.   HISTORY  Chief Complaint Emesis    HPI Lori Brady is a 72 y.o. female patient reports she started vomiting this morning.  She has some nausea with it as well.  She denies any diarrhea belly pain or headache or any other complaints.  Does have a 99 degree temperature.  She reports she uses a walker and falls sometimes last fall was August 6 she says she does have a bruise under the right eye which she says is from that fall.  It was not in the last few days.   Past Medical History:  Diagnosis Date  . Arthritis   . Asthma   . Crohn's disease ([place])   . Emphysema of lung ([place])   . GERD (gastroesophageal reflux disease)   . Hip fracture ([place]) 01/26/2017  . History of chicken pox   . History of heart attack   . Hx of completed stroke   . Hypercholesteremia   . Hypertension   . Pneumonia   . Seasonal allergies   . Sepsis ([place])   . Septic shock ([place]) 10/08/2016  . UTI (lower urinary tract infection)     Patient Active Problem List   Diagnosis Date Noted  . Stroke ([place]) 03/07/2018  . Primary Parkinsonism ([place]) 01/05/2018  . SIADH (syndrome of inappropriate ADH production) ([place]) 10/01/2017  . Spinal stenosis 10/01/2017  . Elevated troponin I level 11/18/2016  . COPD (chronic obstructive pulmonary disease) with chronic bronchitis ([place]) 08/12/2016  . Asthma without status asthmaticus 03/31/2016  . Osteoporosis 03/31/2016  . Fatty liver determined by biopsy 09/17/2015  . Parotid swelling 09/13/2015  . Urticaria 09/13/2015  . Cerebrovascular accident (CVA) ([place]) 08/21/2015  . Senile purpura ([place]) 08/21/2015  . Atrial tachycardia ([place]) 08/13/2015  . Eczema 07/10/2015  . Allergic rhinitis 06/26/2015  . Hyperkalemia  05/03/2015  . Anxiety 04/23/2015  . Edema leg 03/27/2015  . Primary cardiomyopathy ([place]) 03/05/2015  . CAD (coronary artery disease) 03/05/2015  . Benign essential HTN 02/28/2015  . Combined fat and carbohydrate induced hyperlipemia 09/06/2014  . COPD, severe ([place]) 08/01/2014  . Acid reflux 08/01/2014    Past Surgical History:  Procedure Laterality Date  . BREAST BIOPSY Bilateral 1960's to 90's  . CATARACT EXTRACTION, BILATERAL      Prior to Admission medications   Medication Sig Start Date End Date Taking? Authorizing Provider  Abaloparatide (TYMLOS) 3120 MCG/1.56ML SOPN Inject into the skin daily.  06/01/17  Yes [provider]  aspirin 81 MG tablet Take 81 mg by mouth daily.   Yes [provider]  carbidopa-levodopa (SINEMET CR) 50-200 MG tablet Take 1 tablet by mouth daily. 07/08/18  Yes [provider]  cetirizine (ZYRTEC) 10 MG tablet Take 1 tablet (10 mg total) by mouth daily. 03/07/18  Yes Allyne Gee, MD  chlorthalidone (HYGROTON) 25 MG tablet Take 1 tablet (25 mg total) by mouth daily. 10/19/16  Yes Mar Daring, PA-C  Cholecalciferol (VITAMIN D) 2000 UNITS CAPS Take 1 capsule by mouth daily.   Yes [provider]  citalopram (CELEXA) 20 MG tablet TAKE 1 TABLET BY MOUTH ONCE DAILY 02/16/18  Yes Carles Collet M, PA-C  lisinopril (PRINIVIL,ZESTRIL) 20 MG tablet Take 20 mg by mouth daily.  10/14/16  Yes [provider]  montelukast (SINGULAIR) 10 MG tablet Take 1 tablet (10 mg total) by mouth daily. 11/25/17  Yes Carles Collet M, PA-C  Omega-3 Fatty Acids (FISH OIL) 1000 MG CAPS Take 1 capsule by mouth daily.   Yes [provider]  potassium chloride SA (K-DUR,KLOR-CON) 20 MEQ tablet Take 1 tablet (20 mEq total) by mouth daily. 06/30/17  Yes Mar Daring, PA-C  simvastatin (ZOCOR) 40 MG tablet TAKE 1 TABLET BY MOUTH AT BEDTIME 01/25/18  Yes Pollak, Adriana M, PA-C  albuterol (ACCUNEB) 0.63 MG/3ML nebulizer  solution Take 1 ampule by nebulization every 6 (six) hours as needed for wheezing.    [provider]  albuterol (PROVENTIL HFA;VENTOLIN HFA) 108 (90 Base) MCG/ACT inhaler Inhale 2 puffs into the lungs every 6 (six) hours as needed for wheezing or shortness of breath. 03/07/18   Devona Konig A, MD  betamethasone valerate (VALISONE) 0.1 % cream APPLY TOPICALLY TO AFFECTED AREA SPARINGLY 2 TIMES DAILY. 07/10/15   Margarita Rana, MD  BREO ELLIPTA 100-25 MCG/INH AEPB USE 1 PUFF ONCE DAILY AS DIRECTED 12/23/17   Ronnell Freshwater, NP  carbidopa-levodopa (SINEMET IR) 25-100 MG tablet Take 2 tablets by mouth 3 (three) times daily.  12/13/17 06/11/18  [provider]  fluticasone (FLONASE) 50 MCG/ACT nasal spray PLACE TO SPRAYS INTO BOTHS NOSTRILS DAILY 03/17/18   Trinna Post, PA-C  ibuprofen (ADVIL,MOTRIN) 200 MG tablet Take 800 mg by mouth every 6 (six) hours as needed.    [provider]  Tiotropium Bromide Monohydrate (SPIRIVA RESPIMAT) 2.5 MCG/ACT AERS Inhale 2 puffs into the lungs daily.    [provider]    Allergies Levaquin [levofloxacin] and Vicodin [hydrocodone-acetaminophen]  Family History  Problem Relation Age of Onset  . Cancer Sister        ovarian/uterine/breast  . Breast cancer Sister 76  . Cancer Sister        breast  . Heart disease Sister   . Breast cancer Sister 94  . Heart disease Father   . Cancer Father        liver  . Cancer Mother   . Heart disease Brother   . Heart attack Brother   . Cancer Brother        liver  . Heart disease Sister   . Diabetes Maternal Grandmother     Social History Social History   Tobacco Use  . Smoking status: Former Smoker    Packs/day: 1.00    Years: 30.00    Pack years: 30.00    Types: Cigarettes    Last attempt to quit: 08/01/2008    Years since quitting: 9.9  . Smokeless tobacco: Never Used  Substance Use Topics  . Alcohol use: Yes    Alcohol/week: 1.0 standard drinks    Types: 1 Shots  of liquor per week  . Drug use: No    Review of Systems  Constitutional: No fever/chills Eyes: No visual changes. ENT: No sore throat. Cardiovascular: Denies chest pain. Respiratory: Denies shortness of breath. Gastrointestinal: No abdominal pain.   nausea,  vomiting.  No diarrhea.  No constipation. Genitourinary: Negative for dysuria. Musculoskeletal: Negative for back pain. Skin: Negative for rash. Neurological: Negative for headaches, focal weakness   ____________________________________________   PHYSICAL EXAM:  VITAL SIGNS: ED Triage Vitals [07/16/18 0858]  Enc Vitals Group     BP (!) 92/50     Pulse Rate (!) 120     Resp 14     Temp 99.1 F (37.3 C)  Temp Source Oral     SpO2      Weight 150 lb (68 kg)     Height      Head Circumference      Peak Flow      Pain Score 0     Pain Loc      Pain Edu?      Excl. in [place]?     Constitutional: Alert and oriented. Well appearing and in no acute distress. Eyes: Conjunctivae are normal.  Head: Atraumatic except for a bruise described in HPI. Nose: No congestion/rhinnorhea. Mouth/Throat: Mucous membranes are moist.  Oropharynx non-erythematous. Neck: No stridor. Cardiovascular: Normal rate, regular rhythm. Grossly normal heart sounds.  Good peripheral circulation. Respiratory: Normal respiratory effort.  No retractions. Lungs CTAB. Gastrointestinal: Soft and nontender. No distention. No abdominal bruits. No CVA tenderness. Musculoskeletal: No lower extremity tenderness nor edema.  No joint effusions. Neurologic:  Normal speech and language. No gross focal neurologic deficits are appreciated. Skin:  Skin is warm, dry and intact. No rash noted. Psychiatric: Mood and affect are normal. Speech and behavior are normal.  ____________________________________________   LABS (all labs ordered are listed, but only abnormal results are displayed)  Labs Reviewed  COMPREHENSIVE METABOLIC PANEL - Abnormal; Notable for the  following components:      Result Value   Sodium 128 (*)    Potassium 3.3 (*)    Chloride 92 (*)    CO2 21 (*)    Glucose, Bld 121 (*)    Creatinine, Ser 1.12 (*)    Calcium 8.7 (*)    AST 44 (*)    Total Bilirubin 1.7 (*)    GFR calc non Af Amer 49 (*)    GFR calc Af Amer 56 (*)    All other components within normal limits  CBC WITH DIFFERENTIAL/PLATELET - Abnormal; Notable for the following components:   RBC 3.18 (*)    Hemoglobin 10.1 (*)    HCT 30.1 (*)    Platelets 124 (*)    Neutro Abs 9.0 (*)    Lymphs Abs 0.1 (*)    All other components within normal limits  LIPASE, BLOOD  TROPONIN I  URINALYSIS, COMPLETE (UACMP) WITH MICROSCOPIC  BASIC METABOLIC PANEL   ____________________________________________  EKG  EKG read interpreted by me shows sinus tachycardia rate of 110 normal axis no acute ST-T wave changes ____________________________________________  RADIOLOGY  ED MD interpretation: Viewed abdominal series read by radiology reviewed by me shows no acute changes  Official radiology report(s): Ct Head Wo Contrast  Result Date: 07/16/2018 CLINICAL DATA:  Acute onset nausea and vomiting today.  Headache. EXAM: CT HEAD WITHOUT CONTRAST TECHNIQUE: Contiguous axial images were obtained from the base of the skull through the vertex without intravenous contrast. COMPARISON:  Brain MRI 11/03/2017. FINDINGS: Brain: No evidence of acute infarction, hemorrhage, hydrocephalus, extra-axial collection or mass lesion/mass effect. Atrophy, chronic microvascular ischemic change and remote left basal ganglia infarct are identified. Vascular: Extensive atherosclerotic vascular disease is seen. Skull: Intact.  No focal lesion. Sinuses/Orbits: Status post bilateral lens extraction. Otherwise negative. Other: None. IMPRESSION: No acute abnormality. Atrophy, chronic microvascular ischemic change remote left basal ganglia infarct. Atherosclerosis. Electronically Signed   By: Inge Rise  M.D.   On: 07/16/2018 13:09   Dg Abdomen Acute W/chest  Result Date: 07/16/2018 CLINICAL DATA:  Emesis, emphysema, former smoker EXAM: DG ABDOMEN ACUTE W/ 1V CHEST COMPARISON:  03/04/2017 FINDINGS: Normal heart size and vascularity. Lungs remain clear. No focal pneumonia, collapse  or consolidation. Negative for edema, effusion or pneumothorax. Trachea is midline. Aorta is atherosclerotic. No free air. Scattered air throughout the bowel. Negative for significant dilatation or obstruction. Remote healed inferior rami fractures noted. Bones are osteopenic. IMPRESSION: No acute or significant finding by plain radiography. Electronically Signed   By: Jerilynn Mages.  Shick M.D.   On: 07/16/2018 09:41    ____________________________________________   PROCEDURES  Procedure(s) performed:   Procedures  Critical Care performed:   ____________________________________________   INITIAL IMPRESSION / ASSESSMENT AND PLAN / ED COURSE  Since patient has been in the emergency room she is received 2 L of fluid Phenergan and Zofran.  She stopped vomiting but looks considerably sicker becoming more tachypneic and just overall ill-appearing.  Her blood pressure has been low several times.  We will get the results of the CT of the abdomen and admit her to the hospital.        ____________________________________________   FINAL CLINICAL IMPRESSION(S) / ED DIAGNOSES  Final diagnoses:  Dehydration  Abdominal pain, unspecified abdominal location  Nausea vomiting and diarrhea  Hyponatremia  Hypokalemia     ED Discharge Orders    None       Note:  This document was prepared using Dragon voice recognition software and may include unintentional dictation errors.    Nena Polio, MD 07/16/18 [id number]

## 2018-07-16 NOTE — ED Notes (Signed)
[facility], EDT, to transport pt to [address]. Floor aware pt is en route.

## 2018-07-16 NOTE — ED Notes (Signed)
X-ray at bedside

## 2018-07-16 NOTE — H&P (Signed)
[facility] [place] NAME: Lori Brady    MR#:  [id number]  DATE OF BIRTH:  09/17/1946  DATE OF ADMISSION:  07/16/2018  PRIMARY CARE PHYSICIAN: Trinna Post, PA-C   REQUESTING/REFERRING PHYSICIAN: Dr Conni Slipper  CHIEF COMPLAINT:   Chief Complaint  Patient presents with  . Emesis    HISTORY OF PRESENT ILLNESS:  Lori Brady  is a 72 y.o. female with a known history of Parkinson's disease presents with nausea vomiting and diarrhea.  States she vomited 20 times last night and yesterday starting around 2 PM.  No blood in the vomit.  Then started developing some diarrhea.  No blood in the bowel movements.  3 weeks ago she stated she was given an antibiotic for respiratory issues.  She states that she wheezes on and off because of her COPD.  She has been having chills and sweats and some fatigue.  No abdominal pain.  Hospitalist services were contacted for further evaluation.  PAST MEDICAL HISTORY:   Past Medical History:  Diagnosis Date  . Arthritis   . Asthma   . Crohn's disease ([place])   . Emphysema of lung ([place])   . GERD (gastroesophageal reflux disease)   . Hip fracture ([place]) 01/26/2017  . History of chicken pox   . History of heart attack   . Hx of completed stroke   . Hypercholesteremia   . Hypertension   . Parkinson disease ([place])   . Pneumonia   . Seasonal allergies   . Sepsis ([place])   . Septic shock ([place]) 10/08/2016  . UTI (lower urinary tract infection)     PAST SURGICAL HISTORY:   Past Surgical History:  Procedure Laterality Date  . BREAST BIOPSY Bilateral 1960's to 90's  . breast cysts    . CATARACT EXTRACTION, BILATERAL    . right wrist fracture    . TUBAL LIGATION      SOCIAL HISTORY:   Social History   Tobacco Use  . Smoking status: Former Smoker    Packs/day: 1.00    Years: 30.00    Pack years: 30.00    Types: Cigarettes    Last attempt to quit: 08/01/2008    Years since quitting: 9.9  .  Smokeless tobacco: Never Used  Substance Use Topics  . Alcohol use: Yes    Alcohol/week: 1.0 standard drinks    Types: 1 Shots of liquor per week    FAMILY HISTORY:   Family History  Problem Relation Age of Onset  . Cancer Sister        ovarian/uterine/breast  . Breast cancer Sister 30  . Cancer Sister        breast  . Heart disease Sister   . Breast cancer Sister 37  . Heart disease Father   . Cancer Father        liver  . Pancreatic cancer Mother   . Heart disease Brother   . Heart attack Brother   . Cancer Brother        liver  . Heart disease Sister   . Diabetes Maternal Grandmother     DRUG ALLERGIES:   Allergies  Allergen Reactions  . Levaquin [Levofloxacin] Nausea Only  . Vicodin [Hydrocodone-Acetaminophen] Nausea Only    REVIEW OF SYSTEMS:  CONSTITUTIONAL: No fever.  Positive for chills and sweats.  Positive for fatigue.  EYES: No blurred or double vision.  EARS, NOSE, AND THROAT: No tinnitus or ear pain.  Positive for runny  nose and sore throat.  Unable to hear very well out of the left ear. RESPIRATORY: Positive for cough, shortness of breath, and wheezing.  No hemoptysis.  CARDIOVASCULAR: No chest pain, orthopnea, edema.  GASTROINTESTINAL: Positive for nausea, vomiting, and diarrhea.  No abdominal pain. No blood in bowel movements GENITOURINARY: No dysuria, hematuria.  ENDOCRINE: No polyuria, nocturia,  HEMATOLOGY: No anemia, easy bruising or bleeding SKIN: History of dermatitis and rash and itching MUSCULOSKELETAL: No joint pain or arthritis.   NEUROLOGIC: No tingling, numbness, weakness.  PSYCHIATRY: No anxiety or depression.   MEDICATIONS AT HOME:   Prior to Admission medications   Medication Sig Start Date End Date Taking? Authorizing Provider  Abaloparatide (TYMLOS) 3120 MCG/1.56ML SOPN Inject into the skin daily.  06/01/17  Yes [provider]  aspirin 81 MG tablet Take 81 mg by mouth daily.   Yes [provider]   carbidopa-levodopa (SINEMET CR) 50-200 MG tablet Take 1 tablet by mouth daily. 07/08/18  Yes [provider]  cetirizine (ZYRTEC) 10 MG tablet Take 1 tablet (10 mg total) by mouth daily. 03/07/18  Yes Allyne Gee, MD  chlorthalidone (HYGROTON) 25 MG tablet Take 1 tablet (25 mg total) by mouth daily. 10/19/16  Yes Mar Daring, PA-C  Cholecalciferol (VITAMIN D) 2000 UNITS CAPS Take 1 capsule by mouth daily.   Yes [provider]  citalopram (CELEXA) 20 MG tablet TAKE 1 TABLET BY MOUTH ONCE DAILY 02/16/18  Yes Carles Collet M, PA-C  lisinopril (PRINIVIL,ZESTRIL) 20 MG tablet Take 20 mg by mouth daily.  10/14/16  Yes [provider]  montelukast (SINGULAIR) 10 MG tablet Take 1 tablet (10 mg total) by mouth daily. 11/25/17  Yes Carles Collet M, PA-C  Omega-3 Fatty Acids (FISH OIL) 1000 MG CAPS Take 1 capsule by mouth daily.   Yes [provider]  potassium chloride SA (K-DUR,KLOR-CON) 20 MEQ tablet Take 1 tablet (20 mEq total) by mouth daily. 06/30/17  Yes Mar Daring, PA-C  simvastatin (ZOCOR) 40 MG tablet TAKE 1 TABLET BY MOUTH AT BEDTIME 01/25/18  Yes Pollak, Adriana M, PA-C  albuterol (ACCUNEB) 0.63 MG/3ML nebulizer solution Take 1 ampule by nebulization every 6 (six) hours as needed for wheezing.    [provider]  albuterol (PROVENTIL HFA;VENTOLIN HFA) 108 (90 Base) MCG/ACT inhaler Inhale 2 puffs into the lungs every 6 (six) hours as needed for wheezing or shortness of breath. 03/07/18   Devona Konig A, MD  betamethasone valerate (VALISONE) 0.1 % cream APPLY TOPICALLY TO AFFECTED AREA SPARINGLY 2 TIMES DAILY. 07/10/15   Margarita Rana, MD  BREO ELLIPTA 100-25 MCG/INH AEPB USE 1 PUFF ONCE DAILY AS DIRECTED 12/23/17   Ronnell Freshwater, NP  carbidopa-levodopa (SINEMET IR) 25-100 MG tablet Take 2 tablets by mouth 3 (three) times daily.  12/13/17 06/11/18  [provider]  fluticasone (FLONASE) 50 MCG/ACT nasal spray PLACE TO SPRAYS INTO  BOTHS NOSTRILS DAILY 03/17/18   Trinna Post, PA-C  ibuprofen (ADVIL,MOTRIN) 200 MG tablet Take 800 mg by mouth every 6 (six) hours as needed.    [provider]  Tiotropium Bromide Monohydrate (SPIRIVA RESPIMAT) 2.5 MCG/ACT AERS Inhale 2 puffs into the lungs daily.    [provider]      VITAL SIGNS:  Blood pressure 116/64, pulse (!) 116, temperature 99.1 F (37.3 C), temperature source Oral, resp. rate (!) 36, weight 68 kg, SpO2 95 %.  PHYSICAL EXAMINATION:  GENERAL:  73 y.o.-year-old patient lying in the bed with  no acute distress.  Audible wheeze heard. EYES: Pupils equal, round, reactive to light and accommodation. No scleral icterus. Extraocular muscles intact.  HEENT: Head atraumatic, normocephalic. Oropharynx and nasopharynx clear.  NECK:  Supple, no jugular venous distention. No thyroid enlargement, no tenderness.  LUNGS: Normal breath sounds bilaterally, upper airway wheezing. No rales,rhonchi or crepitation. No use of accessory muscles of respiration.  CARDIOVASCULAR: S1, S2 normal. No murmurs, rubs, or gallops.  ABDOMEN: Soft, nontender, nondistended. Bowel sounds present. No organomegaly or mass.  EXTREMITIES: No pedal edema, cyanosis, or clubbing.  NEUROLOGIC: Cranial nerves II through XII are intact. Muscle strength 5/5 in all extremities. Sensation intact. Gait not checked.  PSYCHIATRIC: The patient is alert and oriented x 3.  SKIN: No rash, lesion, or ulcer.   LABORATORY PANEL:   CBC Recent Labs  Lab 07/16/18 1043  WBC 9.3  HGB 10.1*  HCT 30.1*  PLT 124*   ------------------------------------------------------------------------------------------------------------------  Chemistries  Recent Labs  Lab 07/16/18 0906  NA 128*  K 3.3*  CL 92*  CO2 21*  GLUCOSE 121*  BUN 22  CREATININE 1.12*  CALCIUM 8.7*  AST 44*  ALT 7  ALKPHOS 103  BILITOT 1.7*    ------------------------------------------------------------------------------------------------------------------  Cardiac Enzymes Recent Labs  Lab 07/16/18 0906  TROPONINI <0.03   ------------------------------------------------------------------------------------------------------------------  RADIOLOGY:  Ct Abdomen Pelvis Wo Contrast  Result Date: 07/16/2018 CLINICAL DATA:  Nausea and vomiting today.  Altered mental status. EXAM: CT ABDOMEN AND PELVIS WITHOUT CONTRAST TECHNIQUE: Multidetector CT imaging of the abdomen and pelvis was performed following the standard protocol without IV contrast. COMPARISON:  CT of the pelvis on 01/26/2017 FINDINGS: Lower chest: Coronary artery calcifications are present. No pulmonary nodules, pleural effusions, or infiltrates. Hepatobiliary: There is diffuse low-attenuation of the liver. No focal liver lesions. Gallbladder is present. Pancreas: Unremarkable. No pancreatic ductal dilatation or surrounding inflammatory changes. Spleen: Normal in size without focal abnormality. Adrenals/Urinary Tract: Adrenal glands are normal in appearance. There are small bilateral renal cysts. No intrarenal calculi in or ureteral obstruction. The bladder and visualized urethra are unremarkable. Stomach/Bowel: Small hiatal hernia. The stomach is otherwise normal. Small bowel loops are normal in appearance. There is fatty deposition within the wall of the colon. The colon is decompressed. No suspicious mass identified. Vascular/Lymphatic: There is atherosclerotic calcification of the abdominal aorta not associated with aneurysm. No retroperitoneal or mesenteric adenopathy. Reproductive: The uterus is present.  No adnexal mass. Other: No free pelvic fluid. Anterior abdominal wall is unremarkable. Musculoskeletal: There are chronic wedge compression fractures of T9 and L5. There is generalized sclerosis of the sacrum, raising the question of remote versus insufficiency fractures.  Remote fractures of the inferior pubic rami bilaterally. IMPRESSION: 1. Hepatic steatosis.  No focal liver lesions. 2. Chronic changes in the wall of the colon, consistent with chronic inflammation. No acute inflammation or acute diverticulitis. 3. Coronary artery calcifications. Aortic atherosclerosis. (ICD10-I70.0) 4. Small hiatal hernia. 5. Chronic pelvic fractures. Sclerosis of the sacrum suggests remote sacral fracture or insufficiency fractures. Electronically Signed   By: Nolon Nations M.D.   On: 07/16/2018 16:12   Ct Head Wo Contrast  Result Date: 07/16/2018 CLINICAL DATA:  Acute onset nausea and vomiting today.  Headache. EXAM: CT HEAD WITHOUT CONTRAST TECHNIQUE: Contiguous axial images were obtained from the base of the skull through the vertex without intravenous contrast. COMPARISON:  Brain MRI 11/03/2017. FINDINGS: Brain: No evidence of acute infarction, hemorrhage, hydrocephalus, extra-axial collection or mass lesion/mass effect. Atrophy, chronic microvascular ischemic change and remote left  basal ganglia infarct are identified. Vascular: Extensive atherosclerotic vascular disease is seen. Skull: Intact.  No focal lesion. Sinuses/Orbits: Status post bilateral lens extraction. Otherwise negative. Other: None. IMPRESSION: No acute abnormality. Atrophy, chronic microvascular ischemic change remote left basal ganglia infarct. Atherosclerosis. Electronically Signed   By: Inge Rise M.D.   On: 07/16/2018 13:09   Dg Abdomen Acute W/chest  Result Date: 07/16/2018 CLINICAL DATA:  Emesis, emphysema, former smoker EXAM: DG ABDOMEN ACUTE W/ 1V CHEST COMPARISON:  03/04/2017 FINDINGS: Normal heart size and vascularity. Lungs remain clear. No focal pneumonia, collapse or consolidation. Negative for edema, effusion or pneumothorax. Trachea is midline. Aorta is atherosclerotic. No free air. Scattered air throughout the bowel. Negative for significant dilatation or obstruction. Remote healed inferior  rami fractures noted. Bones are osteopenic. IMPRESSION: No acute or significant finding by plain radiography. Electronically Signed   By: Jerilynn Mages.  Shick M.D.   On: 07/16/2018 09:41     IMPRESSION AND PLAN:   1.  Acute gastroenteritis with nausea vomiting and diarrhea.  Send off stool studies.  Supportive management with IV fluids and PRN nausea medications.  Empiric antibiotics with Rocephin and Flagyl. 2.  Aspiration pneumonitis versus COPD exacerbation.  Start nebulizer treatments with budesonide nebulizer and DuoNeb nebulizer.  Start Solu-Medrol 125 mg IV once and to 40 mg IV twice daily. 3.  Relative hypotension hold antihypertensive medications and give IV fluids. 4.  Parkinson's disease continue Sinemet 5.  Hyponatremia and hypokalemia likely secondary to chlorthalidone use.  Discontinue this medication.  IV fluid hydration with potassium 6.  History of CVA on aspirin  All the records are reviewed and case discussed with ED provider. Management plans discussed with the patient, family and they are in agreement.  CODE STATUS: Full code  TOTAL TIME TAKING CARE OF THIS PATIENT: 38  Minutes, including ACP time.    Loletha Grayer M.D on 07/16/2018 at 4:59 PM  Between 7am to 6pm - Pager - [phone]  After 6pm call admission pager [phone]  [facility]  [phone]  CC: Primary care physician; Trinna Post, PA-C

## 2018-07-16 NOTE — ED Triage Notes (Signed)
Pt presents via EMS c/o N/V. Denies pain. Reports started this am at apx 0200.

## 2018-07-16 NOTE — ED Notes (Signed)
Report to Ashley, RN

## 2018-07-16 NOTE — Progress Notes (Signed)
Patient ID: Lori Brady, female   DOB: 07-Oct-1946, 72 y.o.   MRN: [id number]  ACP note   patient and family at bedside  Diagnosis: Acute gastroenteritis with nausea vomiting diarrhea, aspiration pneumonitis versus COPD exacerbation, relative hypotension, Parkinson's disease, hyponatremia and hypokalemia, history of CVA  CODE STATUS discussed and patient wishes to be a full code.  Plan.  Supportive care for gastroenteritis.  Empiric antibiotics with Rocephin and Flagyl.  Send off stool studies.  For aspiration pneumonitis and COPD exacerbation empiric antibiotics and Solu-Medrol and nebulizer treatments.  Time spent on ACP discussion 17 minutes Dr. Loletha Grayer

## 2018-07-17 ENCOUNTER — Inpatient Hospital Stay: Payer: Medicare Other

## 2018-07-17 ENCOUNTER — Other Ambulatory Visit: Payer: Self-pay

## 2018-07-17 LAB — GASTROINTESTINAL PANEL BY PCR, STOOL (REPLACES STOOL CULTURE)
Adenovirus F40/41: NOT DETECTED
Astrovirus: NOT DETECTED
CAMPYLOBACTER SPECIES: NOT DETECTED
Cryptosporidium: NOT DETECTED
Cyclospora cayetanensis: NOT DETECTED
Entamoeba histolytica: NOT DETECTED
Enteroaggregative E coli (EAEC): NOT DETECTED
Enteropathogenic E coli (EPEC): NOT DETECTED
Enterotoxigenic E coli (ETEC): NOT DETECTED
Giardia lamblia: NOT DETECTED
Norovirus GI/GII: NOT DETECTED
Plesimonas shigelloides: NOT DETECTED
ROTAVIRUS A: NOT DETECTED
SHIGA LIKE TOXIN PRODUCING E COLI (STEC): NOT DETECTED
Salmonella species: NOT DETECTED
Sapovirus (I, II, IV, and V): NOT DETECTED
Shigella/Enteroinvasive E coli (EIEC): NOT DETECTED
Vibrio cholerae: NOT DETECTED
Vibrio species: NOT DETECTED
YERSINIA ENTEROCOLITICA: NOT DETECTED

## 2018-07-17 LAB — CBC
HCT: 26.7 % — ABNORMAL LOW (ref 35.0–47.0)
HEMOGLOBIN: 9.5 g/dL — AB (ref 12.0–16.0)
MCH: 32.5 pg (ref 26.0–34.0)
MCHC: 35.6 g/dL (ref 32.0–36.0)
MCV: 91.1 fL (ref 80.0–100.0)
Platelets: 94 10*3/uL — ABNORMAL LOW (ref 150–440)
RBC: 2.93 MIL/uL — AB (ref 3.80–5.20)
RDW: 12.3 % (ref 11.5–14.5)
WBC: 9.3 10*3/uL (ref 3.6–11.0)

## 2018-07-17 LAB — BASIC METABOLIC PANEL
ANION GAP: 10 (ref 5–15)
BUN: 36 mg/dL — ABNORMAL HIGH (ref 8–23)
CALCIUM: 7.7 mg/dL — AB (ref 8.9–10.3)
CO2: 23 mmol/L (ref 22–32)
Chloride: 98 mmol/L (ref 98–111)
Creatinine, Ser: 1.64 mg/dL — ABNORMAL HIGH (ref 0.44–1.00)
GFR calc non Af Amer: 31 mL/min — ABNORMAL LOW (ref >60)
GFR, EST AFRICAN AMERICAN: 36 mL/min — AB (ref >60)
Glucose, Bld: 153 mg/dL — ABNORMAL HIGH (ref 70–99)
Potassium: 4.2 mmol/L (ref 3.5–5.1)
Sodium: 131 mmol/L — ABNORMAL LOW (ref 135–145)

## 2018-07-17 LAB — C DIFFICILE QUICK SCREEN W PCR REFLEX
C DIFFICLE (CDIFF) ANTIGEN: NEGATIVE
C Diff interpretation: NOT DETECTED
C Diff toxin: NEGATIVE

## 2018-07-17 LAB — MAGNESIUM: MAGNESIUM: 1.1 mg/dL — AB (ref 1.7–2.4)

## 2018-07-17 MED ORDER — POTASSIUM CHLORIDE CRYS ER 20 MEQ PO TBCR
20.0000 meq | EXTENDED_RELEASE_TABLET | Freq: Two times a day (BID) | ORAL | Status: DC
  Administered 2018-07-17: 09:00:00 20 meq via ORAL
  Filled 2018-07-17: qty 1

## 2018-07-17 MED ORDER — MAGNESIUM SULFATE 4 GM/100ML IV SOLN
4.0000 g | Freq: Once | INTRAVENOUS | Status: AC
  Administered 2018-07-17: 12:00:00 4 g via INTRAVENOUS
  Filled 2018-07-17: qty 100

## 2018-07-17 MED ORDER — ORAL CARE MOUTH RINSE
15.0000 mL | Freq: Two times a day (BID) | OROMUCOSAL | Status: DC
  Administered 2018-07-17 – 2018-07-18 (×2): 15 mL via OROMUCOSAL

## 2018-07-17 MED ORDER — SODIUM CHLORIDE 0.9 % IV SOLN
INTRAVENOUS | Status: DC
  Administered 2018-07-17 (×2): via INTRAVENOUS

## 2018-07-17 MED ORDER — PHENOL 1.4 % MT LIQD
1.0000 | OROMUCOSAL | Status: DC | PRN
  Administered 2018-07-17: 21:00:00 1 via OROMUCOSAL
  Filled 2018-07-17: qty 177

## 2018-07-17 MED ORDER — MELATONIN 5 MG PO TABS
5.0000 mg | ORAL_TABLET | Freq: Once | ORAL | Status: AC
  Administered 2018-07-17: 5 mg via ORAL
  Filled 2018-07-17: qty 1

## 2018-07-17 MED ORDER — SODIUM CHLORIDE 0.9 % IV SOLN
INTRAVENOUS | Status: DC
  Administered 2018-07-17: 05:00:00 via INTRAVENOUS

## 2018-07-17 NOTE — Evaluation (Signed)
Physical Therapy Evaluation Patient Details Name: Lori Brady MRN: [id number] DOB: 1946/11/08 Today's Date: 07/17/2018   History of Present Illness  Pt is a 72 y.o. female with a known history of Parkinson's disease and CVA presented with nausea vomiting and diarrhea.  Stated she vomited 20 times prior to admission.  No blood in the vomit.  Then started developing some diarrhea.  No blood in the bowel movements.  Three weeks ago pt stated she was given an antibiotic for respiratory issues.  She stated that she wheezes on and off because of her COPD.  She has been having chills and sweats and some fatigue.  No abdominal pain.  Hospitalist services were contacted for further evaluation.  Assessment includes: UTI and acute gastroenteritis with nausea vomiting and diarrhea, acute respiratory failure with hypoxia due to COPD exacerbation, hypotension, Parkinson's disease, hyponatremia, hypokalemia, hypomagnesemia, and acute renal failure.     Clinical Impression  Pt presents with mild deficits in strength, transfers, mobility, and gait, and with moderate deficits in activity tolerance.  Pt was Mod Ind with bed mobility tasks and CGA with transfers with good control and stability.  Pt was able to amb 30' with slow cadence and short B step length but was steady without LOB.  Pt reported no adverse symptoms during the session other than mild fatigue.  Pt reports feeling weaker than her baseline and would benefit from HHPT services upon discharge from acute care to address the above deficits and to prevent further functional decline.       Follow Up Recommendations Home health PT    Equipment Recommendations  None recommended by PT    Recommendations for Other Services       Precautions / Restrictions Precautions Precautions: Fall Restrictions Weight Bearing Restrictions: No      Mobility  Bed Mobility Overal bed mobility: Modified Independent             General bed mobility comments:  Extra time and effort but no physical assistance required  Transfers Overall transfer level: Needs assistance Equipment used: Rolling walker (2 wheeled) Transfers: Sit to/from Stand Sit to Stand: Min guard         General transfer comment: Good eccentric and concentric control  Ambulation/Gait Ambulation/Gait assistance: Min guard Gait Distance (Feet): 30 Feet Assistive device: Rolling walker (2 wheeled) Gait Pattern/deviations: Step-through pattern;Decreased step length - right;Decreased step length - left Gait velocity: Decreased   General Gait Details: Good stability during amb with no adverse symptoms reported other than mild fatigue  Stairs            Wheelchair Mobility    Modified Rankin (Stroke Patients Only)       Balance Overall balance assessment: No apparent balance deficits (not formally assessed)                                           Pertinent Vitals/Pain Pain Assessment: No/denies pain    Home Living Family/patient expects to be discharged to:: Private residence Living Arrangements: Other relatives(Pt lives with sister) Available Help at Discharge: Family;Available 24 hours/day Type of Home: House Home Access: Stairs to enter Entrance Stairs-Rails: Right;Left(Can not reach both) Entrance Stairs-Number of Steps: 5 Home Layout: One level Home Equipment: Walker - 2 wheels;Walker - 4 wheels      Prior Function Level of Independence: Independent with assistive device(s)  Comments: Ind amb in the home without AD, RW in the community, Ind with ADLs, one fall in the last year     Hand Dominance   Dominant Hand: Right    Extremity/Trunk Assessment   Upper Extremity Assessment Upper Extremity Assessment: Overall WFL for tasks assessed    Lower Extremity Assessment Lower Extremity Assessment: Generalized weakness       Communication   Communication: No difficulties  Cognition Arousal/Alertness:  Awake/alert Behavior During Therapy: WFL for tasks assessed/performed Overall Cognitive Status: Within Functional Limits for tasks assessed                                        General Comments      Exercises Total Joint Exercises Ankle Circles/Pumps: AROM;Both;10 reps Quad Sets: Strengthening;Both;10 reps Gluteal Sets: Strengthening;Both;10 reps Long Arc Quad: Strengthening;Both;10 reps Knee Flexion: AROM;Both;10 reps Marching in Standing: AROM;Both;10 reps;Seated;Standing Other Exercises Other Exercises: HEP education and review for BLE LAQ, GS, QS, and APs x 10 each 5-6x/day   Assessment/Plan    PT Assessment Patient needs continued PT services  PT Problem List Decreased strength;Decreased activity tolerance       PT Treatment Interventions DME instruction;Gait training;Stair training;Functional mobility training;Therapeutic exercise;Therapeutic activities;Balance training;Patient/family education    PT Goals (Current goals can be found in the Care Plan section)  Acute Rehab PT Goals Patient Stated Goal: To get stronger PT Goal Formulation: With patient Time For Goal Achievement: 07/30/18 Potential to Achieve Goals: Good    Frequency Min 2X/week   Barriers to discharge        Co-evaluation               AM-PAC PT "6 Clicks" Daily Activity  Outcome Measure Difficulty turning over in bed (including adjusting bedclothes, sheets and blankets)?: A Little Difficulty moving from lying on back to sitting on the side of the bed? : A Little Difficulty sitting down on and standing up from a chair with arms (e.g., wheelchair, bedside commode, etc,.)?: Unable Help needed moving to and from a bed to chair (including a wheelchair)?: A Little Help needed walking in hospital room?: A Little Help needed climbing 3-5 steps with a railing? : A Little 6 Click Score: 16    End of Session Equipment Utilized During Treatment: Gait belt;Oxygen Activity  Tolerance: Patient tolerated treatment well Patient left: in chair;with chair alarm set;with call bell/phone within reach Nurse Communication: Mobility status PT Visit Diagnosis: Muscle weakness (generalized) (M62.81);Difficulty in walking, not elsewhere classified (R26.2)    Time: 1610-9604 PT Time Calculation (min) (ACUTE ONLY): 36 min   Charges:   PT Evaluation $PT Eval Low Complexity: 1 Low PT Treatments $Therapeutic Exercise: 8-22 mins        D. Elly Modena PT, DPT 07/17/18, 5:28 PM

## 2018-07-17 NOTE — Progress Notes (Addendum)
[facility] [place] NAME: Lori Brady    MR#:  [id number]  DATE OF BIRTH:  1946/09/12  SUBJECTIVE:  CHIEF COMPLAINT:   Chief Complaint  Patient presents with  . Emesis   Patient feels better but has generalized weakness.  on oxygen by nasal counter 1.5 L. REVIEW OF SYSTEMS:  Review of Systems  Constitutional: Positive for malaise/fatigue. Negative for chills and fever.  HENT: Negative for sore throat.   Eyes: Negative for blurred vision and double vision.  Respiratory: Positive for cough and shortness of breath. Negative for hemoptysis, wheezing and stridor.   Cardiovascular: Negative for chest pain, palpitations, orthopnea and leg swelling.  Gastrointestinal: Negative for abdominal pain, blood in stool, diarrhea, melena, nausea and vomiting.  Genitourinary: Negative for dysuria, flank pain and hematuria.  Musculoskeletal: Negative for back pain and joint pain.  Skin: Negative for rash.  Neurological: Negative for dizziness, sensory change, focal weakness, seizures, loss of consciousness, weakness and headaches.  Endo/Heme/Allergies: Negative for polydipsia.  Psychiatric/Behavioral: Negative for depression. The patient is not nervous/anxious.     DRUG ALLERGIES:   Allergies  Allergen Reactions  . Levaquin [Levofloxacin] Nausea Only  . Vicodin [Hydrocodone-Acetaminophen] Nausea Only   VITALS:  Blood pressure (!) 83/56, pulse (!) 101, temperature 98.3 F (36.8 C), temperature source Oral, resp. rate 19, height 5' 1"  (1.549 m), weight 68.9 kg, SpO2 97 %. PHYSICAL EXAMINATION:  Physical Exam  Constitutional: She is oriented to person, place, and time. She appears well-developed.  HENT:  Head: Normocephalic.  Mouth/Throat: Oropharynx is clear and moist.  Eyes: Pupils are equal, round, and reactive to light. Conjunctivae and EOM are normal. No scleral icterus.  Neck: Normal range of motion. Neck supple. No JVD present. No  tracheal deviation present.  Cardiovascular: Normal rate, regular rhythm and normal heart sounds. Exam reveals no gallop.  No murmur heard. Pulmonary/Chest: Effort normal and breath sounds normal. No respiratory distress. She has no wheezes. She has no rales.  Abdominal: Soft. Bowel sounds are normal. She exhibits no distension. There is no tenderness. There is no rebound.  Musculoskeletal: Normal range of motion. She exhibits no edema or tenderness.  Neurological: She is alert and oriented to person, place, and time. No cranial nerve deficit.  Skin: No rash noted. No erythema.  Psychiatric: She has a normal mood and affect.   LABORATORY PANEL:  Female CBC Recent Labs  Lab 07/17/18 0441  WBC 9.3  HGB 9.5*  HCT 26.7*  PLT 94*   ------------------------------------------------------------------------------------------------------------------ Chemistries  Recent Labs  Lab 07/16/18 0906  07/17/18 0441  NA 128*   < > 131*  K 3.3*   < > 4.2  CL 92*   < > 98  CO2 21*   < > 23  GLUCOSE 121*   < > 153*  BUN 22   < > 36*  CREATININE 1.12*   < > 1.64*  CALCIUM 8.7*   < > 7.7*  MG  --   --  1.1*  AST 44*  --   --   ALT 7  --   --   ALKPHOS 103  --   --   BILITOT 1.7*  --   --    < > = values in this interval not displayed.   RADIOLOGY:  Ct Abdomen Pelvis Wo Contrast  Result Date: 07/16/2018 CLINICAL DATA:  Nausea and vomiting today.  Altered mental status. EXAM: CT ABDOMEN AND PELVIS WITHOUT CONTRAST TECHNIQUE: Multidetector  CT imaging of the abdomen and pelvis was performed following the standard protocol without IV contrast. COMPARISON:  CT of the pelvis on 01/26/2017 FINDINGS: Lower chest: Coronary artery calcifications are present. No pulmonary nodules, pleural effusions, or infiltrates. Hepatobiliary: There is diffuse low-attenuation of the liver. No focal liver lesions. Gallbladder is present. Pancreas: Unremarkable. No pancreatic ductal dilatation or surrounding inflammatory  changes. Spleen: Normal in size without focal abnormality. Adrenals/Urinary Tract: Adrenal glands are normal in appearance. There are small bilateral renal cysts. No intrarenal calculi in or ureteral obstruction. The bladder and visualized urethra are unremarkable. Stomach/Bowel: Small hiatal hernia. The stomach is otherwise normal. Small bowel loops are normal in appearance. There is fatty deposition within the wall of the colon. The colon is decompressed. No suspicious mass identified. Vascular/Lymphatic: There is atherosclerotic calcification of the abdominal aorta not associated with aneurysm. No retroperitoneal or mesenteric adenopathy. Reproductive: The uterus is present.  No adnexal mass. Other: No free pelvic fluid. Anterior abdominal wall is unremarkable. Musculoskeletal: There are chronic wedge compression fractures of T9 and L5. There is generalized sclerosis of the sacrum, raising the question of remote versus insufficiency fractures. Remote fractures of the inferior pubic rami bilaterally. IMPRESSION: 1. Hepatic steatosis.  No focal liver lesions. 2. Chronic changes in the wall of the colon, consistent with chronic inflammation. No acute inflammation or acute diverticulitis. 3. Coronary artery calcifications. Aortic atherosclerosis. (ICD10-I70.0) 4. Small hiatal hernia. 5. Chronic pelvic fractures. Sclerosis of the sacrum suggests remote sacral fracture or insufficiency fractures. Electronically Signed   By: Nolon Nations M.D.   On: 07/16/2018 16:12   Ct Head Wo Contrast  Result Date: 07/16/2018 CLINICAL DATA:  Acute onset nausea and vomiting today.  Headache. EXAM: CT HEAD WITHOUT CONTRAST TECHNIQUE: Contiguous axial images were obtained from the base of the skull through the vertex without intravenous contrast. COMPARISON:  Brain MRI 11/03/2017. FINDINGS: Brain: No evidence of acute infarction, hemorrhage, hydrocephalus, extra-axial collection or mass lesion/mass effect. Atrophy, chronic  microvascular ischemic change and remote left basal ganglia infarct are identified. Vascular: Extensive atherosclerotic vascular disease is seen. Skull: Intact.  No focal lesion. Sinuses/Orbits: Status post bilateral lens extraction. Otherwise negative. Other: None. IMPRESSION: No acute abnormality. Atrophy, chronic microvascular ischemic change remote left basal ganglia infarct. Atherosclerosis. Electronically Signed   By: Inge Rise M.D.   On: 07/16/2018 13:09   ASSESSMENT AND PLAN:   1.  UTI and acute gastroenteritis with nausea vomiting and diarrhea.   No BM after admission. Supportive management with IV fluids and PRN nausea medications.  continue Rocephin and discontinue Flagyl.  2.  Acute respiratory failure with hypoxia due to COPD exacerbation.   Continue Solu-Medrol, nebulizer.   3.  Hypotension hold antihypertensive medications and continue NS iv. 4.  Parkinson's disease continue Sinemet 5.  Hyponatremia and hypokalemia likely secondary to chlorthalidone use.  Discontinued this medication. Continue  NS iv, follow-up BMP. Hypokalemia improved.  Hypomagnesemia.  Given magnesium IV and follow-up level.  6.  History of CVA on aspirin  Acute renal failure.  Continue normal saline IV and follow-up BMP.  All the records are reviewed and case discussed with Care Management/Social Worker. Management plans discussed with the patient, her son and they are in agreement.  CODE STATUS: Full Code  TOTAL TIME TAKING CARE OF THIS PATIENT: 37 minutes.   More than 50% of the time was spent in counseling/coordination of care: YES  POSSIBLE D/C IN 2 DAYS, DEPENDING ON CLINICAL CONDITION.   Demetrios Loll M.D on  07/17/2018 at 12:57 PM  Between 7am to 6pm - Pager - [phone]  After 6pm go to [email or web address] - [employer] [facility]

## 2018-07-17 NOTE — Progress Notes (Signed)
Pt weaned to rm air

## 2018-07-18 LAB — BASIC METABOLIC PANEL
Anion gap: 10 (ref 5–15)
BUN: 36 mg/dL — AB (ref 8–23)
CHLORIDE: 99 mmol/L (ref 98–111)
CO2: 24 mmol/L (ref 22–32)
CREATININE: 0.99 mg/dL (ref 0.44–1.00)
Calcium: 7.6 mg/dL — ABNORMAL LOW (ref 8.9–10.3)
GFR calc Af Amer: >60 mL/min (ref >60)
GFR calc non Af Amer: 56 mL/min — ABNORMAL LOW (ref >60)
GLUCOSE: 155 mg/dL — AB (ref 70–99)
POTASSIUM: 3.7 mmol/L (ref 3.5–5.1)
SODIUM: 133 mmol/L — AB (ref 135–145)

## 2018-07-18 LAB — MAGNESIUM: Magnesium: 2.1 mg/dL (ref 1.7–2.4)

## 2018-07-18 LAB — HIV ANTIBODY (ROUTINE TESTING W REFLEX): HIV Screen 4th Generation wRfx: NONREACTIVE

## 2018-07-18 MED ORDER — CEPHALEXIN 500 MG PO CAPS: 500.0000 mg | ORAL_CAPSULE | Freq: Two times a day (BID) | ORAL | 0 refills | Status: DC

## 2018-07-18 MED ORDER — PREDNISONE 20 MG PO TABS: 40.0000 mg | ORAL_TABLET | Freq: Every day | ORAL | 0 refills | Status: DC

## 2018-07-18 MED ORDER — PREDNISONE 20 MG PO TABS
40.0000 mg | ORAL_TABLET | Freq: Every day | ORAL | Status: DC
  Administered 2018-07-18: 40 mg via ORAL
  Filled 2018-07-18: qty 2

## 2018-07-18 MED ORDER — CEPHALEXIN 500 MG PO CAPS
500.0000 mg | ORAL_CAPSULE | Freq: Two times a day (BID) | ORAL | Status: DC
  Administered 2018-07-18: 09:00:00 500 mg via ORAL
  Filled 2018-07-18: qty 1

## 2018-07-18 NOTE — Progress Notes (Addendum)
Pt discharge in care of son and daughter- in- law. Prescriptions and discharge instructions provided. No questions at time of discharge

## 2018-07-18 NOTE — Progress Notes (Signed)
Pt discharge instructions reviewed. Pt requested to call Son. Two attempts made. Voice mail left for son leaving RN name, title and unit phone number

## 2018-07-18 NOTE — Care Management Note (Signed)
Case Management Note  Patient Details  Name: Lori Brady MRN: [id number] Date of Birth: 1946-06-16  Subjective/Objective:   Met with patient and her sister at bedside. Patient lives with another sister. She is ambulatory with a walker and a cane in the home. She drives intermittently to doctors appointments only. Discuss discharge plan and home health. Provided a list of home health agencies. Referral to [facility] for RN and PT. PCP is Dr. Margretta Sidle at [facility]. Patient seen last a week ago.                   Action/Plan: [facility] for Rn and PT.   Expected Discharge Date:  07/18/18               Expected Discharge Plan:  [place]  In-House Referral:     Discharge planning Services  CM Consult  Post Acute Care Choice:  Home Health Choice offered to:  Patient  DME Arranged:    DME Agency:     HH Arranged:  RN, PT [place] Agency:  [facility] ([facility]  Status of Service:  Completed, signed off  If discussed at [employer], dates discussed:    Additional Comments:  Jolly Mango, RN 07/18/2018, 10:31 AM

## 2018-07-18 NOTE — Discharge Summary (Signed)
[facility] NAME: Lori Brady    MR#:  [id number]  DATE OF BIRTH:  1946/06/28  DATE OF ADMISSION:  07/16/2018   ADMITTING PHYSICIAN: Loletha Grayer, MD  DATE OF DISCHARGE: 07/18/2018 12:20 PM  PRIMARY CARE PHYSICIAN: Trinna Post, PA-C   ADMISSION DIAGNOSIS:  Dehydration [E86.0] Hypokalemia [E87.6] Hyponatremia [E87.1] Nausea vomiting and diarrhea [R11.2, R19.7] Abdominal pain, unspecified abdominal location [R10.9] DISCHARGE DIAGNOSIS:  Active Problems:   Gastroenteritis  SECONDARY DIAGNOSIS:   Past Medical History:  Diagnosis Date  . Arthritis   . Asthma   . Crohn's disease ([place])   . Emphysema of lung ([place])   . GERD (gastroesophageal reflux disease)   . Hip fracture ([place]) 01/26/2017  . History of chicken pox   . History of heart attack   . Hx of completed stroke   . Hypercholesteremia   . Hypertension   . Parkinson disease ([place])   . Pneumonia   . Seasonal allergies   . Sepsis ([place])   . Septic shock ([place]) 10/08/2016  . UTI (lower urinary tract infection)    HOSPITAL COURSE:   1. UTI and acute gastroenteritis with nausea vomiting and diarrhea.  Stool tests are negative. Supportive management with IV fluids and PRN nausea medications.  She was treated with Rocephin and discontinue Flagyl. Symptoms improved. Changed to Keflex for 3 more days.  2. Acute respiratory failure with hypoxia due to COPD exacerbation.  She was treated with Solu-Medrol, nebulizer.   Of oxygen by nasal cannula.  Changed to p.o. prednisone for 3 days.  3. Hypotension, improved with IV fluid support.  Still hold lisinopril and chlorthalidone.  Follow-up with PCP to resume. 4. Parkinson's disease continue Sinemet 5. Hyponatremia and hypokalemia likely secondary to chlorthalidone use. Discontinued this medication.  Improving with normal saline IV. Hypokalemia improved.  Hypomagnesemia.  Given magnesium IV and   improved.  6. History of CVA on aspirin  Acute renal failure.  Improved with IV fluid support. Generalized weakness.  PT evaluation suggest home health and PT. DISCHARGE CONDITIONS:  Stable, discharged to home with home health and PT today. CONSULTS OBTAINED:   DRUG ALLERGIES:   Allergies  Allergen Reactions  . Levaquin [Levofloxacin] Nausea Only  . Vicodin [Hydrocodone-Acetaminophen] Nausea Only   DISCHARGE MEDICATIONS:   Allergies as of 07/18/2018      Reactions   Levaquin [levofloxacin] Nausea Only   Vicodin [hydrocodone-acetaminophen] Nausea Only      Medication List    STOP taking these medications   chlorthalidone 25 MG tablet Commonly known as:  HYGROTON   ibuprofen 200 MG tablet Commonly known as:  ADVIL,MOTRIN   lisinopril 20 MG tablet Commonly known as:  PRINIVIL,ZESTRIL     TAKE these medications   albuterol 0.63 MG/3ML nebulizer solution Commonly known as:  ACCUNEB Take 1 ampule by nebulization every 6 (six) hours as needed for wheezing.   albuterol 108 (90 Base) MCG/ACT inhaler Commonly known as:  PROVENTIL HFA;VENTOLIN HFA Inhale 2 puffs into the lungs every 6 (six) hours as needed for wheezing or shortness of breath.   aspirin 81 MG tablet Take 81 mg by mouth daily.   betamethasone valerate 0.1 % cream Commonly known as:  VALISONE APPLY TOPICALLY TO AFFECTED AREA SPARINGLY 2 TIMES DAILY.   BREO ELLIPTA 100-25 MCG/INH Aepb Generic drug:  fluticasone furoate-vilanterol USE 1 PUFF ONCE DAILY AS DIRECTED   carbidopa-levodopa 25-100 MG tablet Commonly known as:  SINEMET IR Take 2  tablets by mouth 3 (three) times daily.   carbidopa-levodopa 50-200 MG tablet Commonly known as:  SINEMET CR Take 1 tablet by mouth daily.   cephALEXin 500 MG capsule Commonly known as:  KEFLEX Take 1 capsule (500 mg total) by mouth every 12 (twelve) hours.   cetirizine 10 MG tablet Commonly known as:  ZYRTEC Take 1 tablet (10 mg total) by mouth daily.    citalopram 20 MG tablet Commonly known as:  CELEXA TAKE 1 TABLET BY MOUTH ONCE DAILY   Fish Oil 1000 MG Caps Take 1 capsule by mouth daily.   fluticasone 50 MCG/ACT nasal spray Commonly known as:  FLONASE PLACE TO SPRAYS INTO BOTHS NOSTRILS DAILY   montelukast 10 MG tablet Commonly known as:  SINGULAIR Take 1 tablet (10 mg total) by mouth daily.   potassium chloride SA 20 MEQ tablet Commonly known as:  K-DUR,KLOR-CON Take 1 tablet (20 mEq total) by mouth daily.   predniSONE 20 MG tablet Commonly known as:  DELTASONE Take 2 tablets (40 mg total) by mouth daily with breakfast.   simvastatin 40 MG tablet Commonly known as:  ZOCOR TAKE 1 TABLET BY MOUTH AT BEDTIME   SPIRIVA RESPIMAT 2.5 MCG/ACT Aers Generic drug:  Tiotropium Bromide Monohydrate Inhale 2 puffs into the lungs daily.   TYMLOS 3120 MCG/1.56ML Sopn Generic drug:  Abaloparatide Inject into the skin daily.   Vitamin D 2000 units Caps Take 1 capsule by mouth daily.        DISCHARGE INSTRUCTIONS:  See AVS.  If you experience worsening of your admission symptoms, develop shortness of breath, life threatening emergency, suicidal or homicidal thoughts you must seek medical attention immediately by calling 911 or calling your MD immediately  if symptoms less severe.  You Must read complete instructions/literature along with all the possible adverse reactions/side effects for all the Medicines you take and that have been prescribed to you. Take any new Medicines after you have completely understood and accpet all the possible adverse reactions/side effects.   Please note  You were cared for by a hospitalist during your hospital stay. If you have any questions about your discharge medications or the care you received while you were in the hospital after you are discharged, you can call the unit and asked to speak with the hospitalist on call if the hospitalist that took care of you is not available. Once you are  discharged, your primary care physician will handle any further medical issues. Please note that NO REFILLS for any discharge medications will be authorized once you are discharged, as it is imperative that you return to your primary care physician (or establish a relationship with a primary care physician if you do not have one) for your aftercare needs so that they can reassess your need for medications and monitor your lab values.    On the day of Discharge:  VITAL SIGNS:  Blood pressure 126/86, pulse 95, temperature 97.9 F (36.6 C), temperature source Oral, resp. rate (!) 22, height 5' 1"  (1.549 m), weight 68.9 kg, SpO2 98 %. PHYSICAL EXAMINATION:  GENERAL:  72 y.o.-year-old patient lying in the bed with no acute distress.  EYES: Pupils equal, round, reactive to light and accommodation. No scleral icterus. Extraocular muscles intact.  HEENT: Head atraumatic, normocephalic. Oropharynx and nasopharynx clear.  NECK:  Supple, no jugular venous distention. No thyroid enlargement, no tenderness.  LUNGS: Normal breath sounds bilaterally, no wheezing, rales,rhonchi or crepitation. No use of accessory muscles of respiration.  CARDIOVASCULAR: S1,  S2 normal. No murmurs, rubs, or gallops.  ABDOMEN: Soft, non-tender, non-distended. Bowel sounds present. No organomegaly or mass.  EXTREMITIES: No pedal edema, cyanosis, or clubbing.  NEUROLOGIC: Cranial nerves II through XII are intact. Muscle strength 4/5 in all extremities. Sensation intact. Gait not checked.  Mild tremors. PSYCHIATRIC: The patient is alert and oriented x 3.  SKIN: No obvious rash, lesion, or ulcer.  DATA REVIEW:   CBC Recent Labs  Lab 07/17/18 0441  WBC 9.3  HGB 9.5*  HCT 26.7*  PLT 94*    Chemistries  Recent Labs  Lab 07/16/18 0906  07/18/18 0526  NA 128*   < > 133*  K 3.3*   < > 3.7  CL 92*   < > 99  CO2 21*   < > 24  GLUCOSE 121*   < > 155*  BUN 22   < > 36*  CREATININE 1.12*   < > 0.99  CALCIUM 8.7*   < > 7.6*   MG  --    < > 2.1  AST 44*  --   --   ALT 7  --   --   ALKPHOS 103  --   --   BILITOT 1.7*  --   --    < > = values in this interval not displayed.     Microbiology Results  Results for orders placed or performed during the hospital encounter of 07/16/18  Urine Culture     Status: Abnormal (Preliminary result)   Collection Time: 07/16/18  5:46 PM  Result Value Ref Range Status   Specimen Description   Final    URINE, CLEAN CATCH Performed at [facility], [address] [place], [place] [address]    Special Requests   Final    Normal Performed at [facility], [address] [place], [place] [address]    Culture >=100,000 COLONIES/mL ESCHERICHIA COLI (A)  Final   Report Status PENDING  Incomplete  Gastrointestinal Panel by PCR , Stool     Status: None   Collection Time: 07/17/18 11:33 AM  Result Value Ref Range Status   Campylobacter species NOT DETECTED NOT DETECTED Final   Plesimonas shigelloides NOT DETECTED NOT DETECTED Final   Salmonella species NOT DETECTED NOT DETECTED Final   Yersinia enterocolitica NOT DETECTED NOT DETECTED Final   Vibrio species NOT DETECTED NOT DETECTED Final   Vibrio cholerae NOT DETECTED NOT DETECTED Final   Enteroaggregative E coli (EAEC) NOT DETECTED NOT DETECTED Final   Enteropathogenic E coli (EPEC) NOT DETECTED NOT DETECTED Final   Enterotoxigenic E coli (ETEC) NOT DETECTED NOT DETECTED Final   Shiga like toxin producing E coli (STEC) NOT DETECTED NOT DETECTED Final   Shigella/Enteroinvasive E coli (EIEC) NOT DETECTED NOT DETECTED Final   Cryptosporidium NOT DETECTED NOT DETECTED Final   Cyclospora cayetanensis NOT DETECTED NOT DETECTED Final   Entamoeba histolytica NOT DETECTED NOT DETECTED Final   Giardia lamblia NOT DETECTED NOT DETECTED Final   Adenovirus F40/41 NOT DETECTED NOT DETECTED Final   Astrovirus NOT DETECTED NOT DETECTED Final   Norovirus GI/GII NOT DETECTED NOT DETECTED Final   Rotavirus A NOT  DETECTED NOT DETECTED Final   Sapovirus (I, II, IV, and V) NOT DETECTED NOT DETECTED Final    Comment: Performed at [facility], [address] [place], [place] [address]  C difficile quick scan w PCR reflex     Status: None   Collection Time: 07/17/18 11:33 AM  Result Value  Ref Range Status   C Diff antigen NEGATIVE NEGATIVE Final   C Diff toxin NEGATIVE NEGATIVE Final   C Diff interpretation No C. difficile detected.  Final    Comment: Performed at [facility], [address] [place], [place] [address]    RADIOLOGY:  No results found.   Management plans discussed with the patient, family and they are in agreement.  CODE STATUS: Full Code   TOTAL TIME TAKING CARE OF THIS PATIENT: 32 minutes.    Demetrios Loll M.D on 07/18/2018 at 2:36 PM  Between 7am to 6pm - Pager - [phone]  After 6pm go to [email or web address] - password [email or web address] [facility]  [employer] [facility]  Office  [phone]  CC: Primary care physician; Trinna Post, PA-C   Note: This dictation was prepared with Dragon dictation along with smaller phrase technology. Any transcriptional errors that result from this process are unintentional.

## 2018-07-18 NOTE — Discharge Instructions (Signed)
HHPT

## 2018-07-19 ENCOUNTER — Telehealth: Payer: Self-pay

## 2018-07-19 ENCOUNTER — Other Ambulatory Visit: Payer: Self-pay

## 2018-07-19 LAB — URINE CULTURE
Culture: >=100000 — AB
SPECIAL REQUESTS: NORMAL

## 2018-07-19 MED ORDER — POTASSIUM CHLORIDE CRYS ER 20 MEQ PO TBCR: 20.0000 meq | EXTENDED_RELEASE_TABLET | Freq: Every day | ORAL | 5 refills | Status: DC

## 2018-07-19 NOTE — Telephone Encounter (Signed)
Transition Care Management Follow-up Telephone Call  Date of discharge and from where: [facility] on 07/18/18  How have you been since you were released from the hospital? Doing ok, still having SOB and is weak. The SOB is better than it was in the hospital. Pt is still coughing and is seeing white sputum. Declines fever, n/v/d.   Any questions or concerns? No   Items Reviewed:  Did the pt receive and understand the discharge instructions provided? Yes   Medications obtained and verified? Reviewed new and respiratory meds only. Pt to f/u with Dr B at f/u apt.  Any new allergies since your discharge? No   Dietary orders reviewed? Yes  Do you have support at home? Yes   Other (ie: DME, Home Health, etc) Physical therapy  Functional Questionnaire: (I = Independent and D = Dependent) ADL's: Currently using a walker and nebulizer  Bathing/Dressing- I   Meal Prep- I  Eating- I  Maintaining continence- I  Transferring/Ambulation- Using a walker due to weakness.   Managing Meds- I   Follow up appointments reviewed:    PCP Hospital f/u appt confirmed? Yes  Scheduled to see Dr Suzan Slick on 07/22/18 @ 11 AM. Per SB- ok to see Dr B since Ricki Rodriguez is booked.   Specialist Hospital f/u appt confirmed? No  Pt has not schedule apt yet.   Are transportation arrangements needed? No   If their condition worsens, is the pt aware to call  their PCP or go to the ED? Yes  Was the patient provided with contact information for the PCP's office or ED? Yes  Was the pt encouraged to call back with questions or concerns? Yes

## 2018-07-21 ENCOUNTER — Telehealth: Payer: Self-pay

## 2018-07-21 NOTE — Telephone Encounter (Signed)
EMMI Follow-up: Noted on the report that patient had some questions about her discharge papers.  I talked with Lori Brady and she didn't have any questions she just couldn't get the automated system to understand that, so she hung up. I let her know the 1st call we were checking to see if she read over her discharge paperwork, had Rx's filled, had follow-up appointments made and how she was doing.  Said she was doing better and thanked for me checking.  I let her know there would be a second automated call with a different series of questions and to let us know if she had any concerns at that time.

## 2018-07-21 NOTE — Progress Notes (Signed)
Patient: Lori Brady Female    DOB: April 17, 1946   72 y.o.   MRN: [id number] Visit Date: 07/22/2018  Today's Provider: [name], MD   Chief Complaint  Patient presents with  . Hospitalization Follow-up   Subjective:    I, Tiburcio Pea, CMA, am acting as a scribe for [name], MD.   HPI  Follow up Hospitalization  Patient was admitted to [facility] on 07/16/2018 and discharged on 07/18/2018. She was treated for COPD exacerbation, Dehydration, SOB, N/V/D, Hypokalemia, Hyponatremia, and Gastroenteritis. Treatment for this included started Keflex and Prednisone - she has finished these courses. Discontinued Lisinopril and [place].  She needs a refill on [place] Telephone follow up was done on 07/19/2018 She reports good compliance with treatment. She reports this condition is Improved. Patient reports she is experiencing mild diarrhea and weakness and thrush from the antibiotics.   ------------------------------------------------------------------------------------   Allergies  Allergen Reactions  . Levaquin [Levofloxacin] Nausea Only  . Vicodin [Hydrocodone-Acetaminophen] Nausea Only     Current Outpatient Medications:  .  Abaloparatide (TYMLOS) 3120 MCG/1.56ML SOPN, Inject into the skin daily. , Disp: , Rfl:  .  albuterol (ACCUNEB) 0.63 MG/3ML nebulizer solution, Take 1 ampule by nebulization every 6 (six) hours as needed for wheezing., Disp: , Rfl:  .  albuterol (PROVENTIL HFA;VENTOLIN HFA) 108 (90 Base) MCG/ACT inhaler, Inhale 2 puffs into the lungs every 6 (six) hours as needed for wheezing or shortness of breath., Disp: 1 Inhaler, Rfl: 0 .  aspirin 81 MG tablet, Take 81 mg by mouth daily., Disp: , Rfl:  .  betamethasone valerate (VALISONE) 0.1 % cream, APPLY TOPICALLY TO AFFECTED AREA SPARINGLY 2 TIMES DAILY., Disp: 15 g, Rfl: 5 .  BREO ELLIPTA 100-25 MCG/INH AEPB, USE 1 PUFF ONCE DAILY AS DIRECTED, Disp: 180 each, Rfl: 1 .  carbidopa-levodopa (SINEMET CR) 50-200 MG  tablet, Take 1 tablet by mouth daily., Disp: , Rfl:  .  cetirizine (ZYRTEC) 10 MG tablet, Take 1 tablet (10 mg total) by mouth daily., Disp: 30 tablet, Rfl: 11 .  Cholecalciferol (VITAMIN D) 2000 UNITS CAPS, Take 1 capsule by mouth daily., Disp: , Rfl:  .  citalopram (CELEXA) 20 MG tablet, TAKE 1 TABLET BY MOUTH ONCE DAILY, Disp: 90 tablet, Rfl: 1 .  fluticasone (FLONASE) 50 MCG/ACT nasal spray, PLACE TO SPRAYS INTO BOTHS NOSTRILS DAILY, Disp: 16 g, Rfl: 6 .  montelukast (SINGULAIR) 10 MG tablet, Take 1 tablet (10 mg total) by mouth daily., Disp: 90 tablet, Rfl: 1 .  Omega-3 Fatty Acids (FISH OIL) 1000 MG CAPS, Take 1 capsule by mouth daily., Disp: , Rfl:  .  potassium chloride SA (K-DUR,KLOR-CON) 20 MEQ tablet, Take 1 tablet (20 mEq total) by mouth daily., Disp: 60 tablet, Rfl: 5 .  simvastatin (ZOCOR) 40 MG tablet, TAKE 1 TABLET BY MOUTH AT BEDTIME, Disp: 90 tablet, Rfl: 3 .  Tiotropium Bromide Monohydrate (SPIRIVA RESPIMAT) 2.5 MCG/ACT AERS, Inhale 2 puffs into the lungs daily., Disp: , Rfl:  .  carbidopa-levodopa (SINEMET IR) 25-100 MG tablet, Take 2 tablets by mouth 3 (three) times daily. , Disp: , Rfl:    Review of Systems  Constitutional: Positive for fatigue.  Respiratory: Negative.   Cardiovascular: Negative.   Gastrointestinal: Positive for diarrhea.  Genitourinary: Negative.   Musculoskeletal: Negative.   Skin: Negative.   Neurological: Positive for weakness.  Psychiatric/Behavioral: Negative.     Social History   Tobacco Use  . Smoking status: Former Smoker    Packs/day: 1.00  Years: 30.00    Pack years: 30.00    Types: Cigarettes    Last attempt to quit: 08/01/2008    Years since quitting: 9.9  . Smokeless tobacco: Never Used  Substance Use Topics  . Alcohol use: Yes    Alcohol/week: 1.0 standard drinks    Types: 1 Shots of liquor per week   Objective:   BP 116/62 (BP Location: Left Arm, Patient Position: Sitting, Cuff Size: Normal)   Pulse 91   Temp 98.3 F  (36.8 C) (Oral)   Wt 146 lb 12.8 oz (66.6 kg)   SpO2 96%   BMI 27.74 kg/m  Vitals:   07/22/18 1108  BP: 116/62  Pulse: 91  Temp: 98.3 F (36.8 C)  TempSrc: Oral  SpO2: 96%  Weight: 146 lb 12.8 oz (66.6 kg)     Physical Exam  Constitutional: She is oriented to person, place, and time. She appears well-developed and well-nourished. No distress.  HENT:  Head: Normocephalic and atraumatic.  Right Ear: External ear normal.  Left Ear: External ear normal.  Nose: Nose normal.  + oral thrush of tongue and buccal mucosa  Eyes: Conjunctivae are normal. No scleral icterus.  Neck: Neck supple. No thyromegaly present.  Cardiovascular: Normal rate, regular rhythm, normal heart sounds and intact distal pulses.  No murmur heard. Pulmonary/Chest: Effort normal and breath sounds normal. No respiratory distress. She has no wheezes. She has no rales.  Abdominal: Soft. She exhibits no distension. There is no tenderness.  Musculoskeletal: She exhibits no edema.  Lymphadenopathy:    She has no cervical adenopathy.  Neurological: She is alert and oriented to person, place, and time.  Skin: Skin is warm and dry. Capillary refill takes less than 2 seconds. No rash noted.  Psychiatric: She has a normal mood and affect. Her behavior is normal.  Vitals reviewed.       Assessment & Plan:   Problem List Items Addressed This Visit      Cardiovascular and Mediastinum   Benign essential HTN    Well-controlled despite her chlorthalidone and lisinopril being discontinued in the hospital We will recheck BMP as chlorthalidone may have been decreasing potassium levels Monitor blood pressure closely as it may elevate as she improves after hospitalization        Respiratory   COPD (chronic obstructive pulmonary disease) with chronic bronchitis (HCC) - Primary    Chronic and typically well controlled Recently hospitalized for COPD exacerbation She has finished her prednisone She continues to take  her controller inhalers with regularity She is using albuterol as needed Discussed return precautions      Relevant Orders   Ambulatory referral to [place] [facility]   Oral thrush    New problem Likely secondary to antibiotic and prednisone use Treat with nystatin swish and swallow until resolution      Relevant Medications   nystatin (MYCOSTATIN) 100000 UNIT/ML suspension     Other   Generalized weakness    Chronic and worsened after recent hospitalization Will reorder home health physical therapy as she does not believe this is been set up yet after her hospitalization      Relevant Orders   Ambulatory referral to [place]   Hyponatremia    Had improved prior to discharge Recheck today May have been related to chlorthalidone      Relevant Orders   Basic Metabolic Panel (BMET)   Hypokalemia    Had improved prior to discharge from the hospital May  have been related to chlorthalidone Recheck today Will resume oral potassium supplement, but watch this closely as some of her medications are being held      Relevant Orders   Basic Metabolic Panel (BMET)       Return in about 4 weeks (around 08/19/2018) for BP, potassium f/u.   The entirety of the information documented in the History of Present Illness, Review of Systems and Physical Exam were personally obtained by me. Portions of this information were initially documented by Tiburcio Pea, CMA and reviewed by me for thoroughness and accuracy.    Virginia Crews, MD, MPH [facility] 07/22/2018 4:58 PM

## 2018-07-22 ENCOUNTER — Ambulatory Visit: Payer: Medicare Other | Admitting: Family Medicine

## 2018-07-22 ENCOUNTER — Telehealth: Payer: Self-pay | Admitting: Family Medicine

## 2018-07-22 ENCOUNTER — Encounter: Payer: Self-pay | Admitting: Family Medicine

## 2018-07-22 VITALS — BP 116/62 | HR 91 | Temp 98.3°F | Wt 146.8 lb

## 2018-07-22 DIAGNOSIS — B370 Candidal stomatitis: Secondary | ICD-10-CM

## 2018-07-22 DIAGNOSIS — E876 Hypokalemia: Secondary | ICD-10-CM

## 2018-07-22 DIAGNOSIS — R531 Weakness: Secondary | ICD-10-CM

## 2018-07-22 DIAGNOSIS — E871 Hypo-osmolality and hyponatremia: Secondary | ICD-10-CM | POA: Diagnosis not present

## 2018-07-22 DIAGNOSIS — I10 Essential (primary) hypertension: Secondary | ICD-10-CM | POA: Diagnosis not present

## 2018-07-22 DIAGNOSIS — J449 Chronic obstructive pulmonary disease, unspecified: Secondary | ICD-10-CM

## 2018-07-22 MED ORDER — POTASSIUM CHLORIDE CRYS ER 20 MEQ PO TBCR: 20.0000 meq | EXTENDED_RELEASE_TABLET | Freq: Every day | ORAL | 5 refills | Status: DC

## 2018-07-22 MED ORDER — NYSTATIN 100000 UNIT/ML MT SUSP: 5.0000 mL | Freq: Four times a day (QID) | OROMUCOSAL | 0 refills | Status: DC

## 2018-07-22 NOTE — Assessment & Plan Note (Signed)
Chronic and typically well controlled Recently hospitalized for COPD exacerbation She has finished her prednisone She continues to take her controller inhalers with regularity She is using albuterol as needed Discussed return precautions

## 2018-07-22 NOTE — Patient Instructions (Signed)
Oral Thrush, Adult Oral thrush is an infection in your mouth and throat. It causes white patches on your tongue and in your mouth. Follow these instructions at home: Helping with soreness  To lessen your pain: ? Drink cold liquids, like water and iced tea. ? Eat frozen ice pops or frozen juices. ? Eat foods that are easy to swallow, like gelatin and ice cream. ? Drink from a straw if the patches in your mouth are painful. General instructions   Take or use over-the-counter and prescription medicines only as told by your doctor. Medicine for oral thrush may be something to swallow, or it may be something to put on the infected area.  Eat plain yogurt that has live cultures in it. Read the label to make sure.  If you wear dentures: ? Take out your dentures before you go to bed. ? Brush them well. ? Soak them in a denture cleaner.  Rinse your mouth with warm salt-water many times a day. To make the salt-water mixture, completely dissolve 1/2-1 teaspoon of salt in 1 cup of warm water. Contact a doctor if:  Your problems are getting worse.  Your problems do not get better in less than 7 days with treatment.  Your infection is spreading. This may show as white patches on the skin outside of your mouth.  You are nursing your baby and you have redness and pain in the nipples. This information is not intended to replace advice given to you by your health care provider. Make sure you discuss any questions you have with your health care provider. Document Released: 01/27/2010 Document Revised: 07/27/2016 Document Reviewed: 07/27/2016 Elsevier Interactive Patient Education  2017 [employer].

## 2018-07-22 NOTE — Assessment & Plan Note (Signed)
Well-controlled despite her chlorthalidone and lisinopril being discontinued in the hospital We will recheck BMP as chlorthalidone may have been decreasing potassium levels Monitor blood pressure closely as it may elevate as she improves after hospitalization

## 2018-07-22 NOTE — Assessment & Plan Note (Signed)
Had improved prior to discharge Recheck today May have been related to chlorthalidone

## 2018-07-22 NOTE — Assessment & Plan Note (Signed)
Chronic and worsened after recent hospitalization Will reorder home health physical therapy as she does not believe this is been set up yet after her hospitalization

## 2018-07-22 NOTE — Telephone Encounter (Signed)
Cam Hai wanted to let you know that Ms. Lechtenberg's home health services will begin 07/24/18.

## 2018-07-22 NOTE — Assessment & Plan Note (Signed)
New problem Likely secondary to antibiotic and prednisone use Treat with nystatin swish and swallow until resolution

## 2018-07-22 NOTE — Assessment & Plan Note (Signed)
Had improved prior to discharge from the hospital May have been related to chlorthalidone Recheck today Will resume oral potassium supplement, but watch this closely as some of her medications are being held

## 2018-07-23 LAB — BASIC METABOLIC PANEL
BUN/Creatinine Ratio: 16 (ref 12–28)
BUN: 14 mg/dL (ref 8–27)
CO2: 28 mmol/L (ref 20–29)
CREATININE: 0.86 mg/dL (ref 0.57–1.00)
Calcium: 8.7 mg/dL (ref 8.7–10.3)
Chloride: 90 mmol/L — ABNORMAL LOW (ref 96–106)
GFR calc Af Amer: 79 mL/min/{1.73_m2} (ref >59)
GFR, EST NON AFRICAN AMERICAN: 69 mL/min/{1.73_m2} (ref >59)
GLUCOSE: 88 mg/dL (ref 65–99)
POTASSIUM: 3.2 mmol/L — AB (ref 3.5–5.2)
Sodium: 135 mmol/L (ref 134–144)

## 2018-07-25 ENCOUNTER — Telehealth: Payer: Self-pay

## 2018-07-25 NOTE — Telephone Encounter (Signed)
Patient advised as below. Patient verbalizes understanding and is in agreement with treatment plan.  

## 2018-07-25 NOTE — Telephone Encounter (Signed)
-----   Message from Erasmo Downer, MD sent at 07/23/2018  9:22 AM EDT ----- Normal kidney function and sodium. Passion is slightly low. Take supplement as prescribed. Can recheck at next visit.

## 2018-08-01 ENCOUNTER — Telehealth: Payer: Self-pay | Admitting: Family Medicine

## 2018-08-01 NOTE — Telephone Encounter (Signed)
Lori Brady with [facility] is requesting verbal orders for in home occupational therapy as follows:  Twice a week for 4 weeks  Please advise. Thanks TNP

## 2018-08-01 NOTE — Telephone Encounter (Signed)
OK for verbals.  Erasmo Downer, MD, MPH [facility] 08/01/2018 4:41 PM

## 2018-08-02 NOTE — Telephone Encounter (Signed)
Verbal order given to [place] with [facility]

## 2018-08-22 ENCOUNTER — Other Ambulatory Visit: Payer: Self-pay | Admitting: Physician Assistant

## 2018-08-24 ENCOUNTER — Other Ambulatory Visit: Payer: Self-pay | Admitting: Adult Health

## 2018-08-24 ENCOUNTER — Ambulatory Visit: Payer: Medicare Other | Admitting: Family Medicine

## 2018-08-24 ENCOUNTER — Encounter: Payer: Self-pay | Admitting: Family Medicine

## 2018-08-24 VITALS — BP 130/78 | HR 99 | Temp 98.8°F | Resp 16 | Wt 142.0 lb

## 2018-08-24 DIAGNOSIS — E876 Hypokalemia: Secondary | ICD-10-CM

## 2018-08-24 DIAGNOSIS — I10 Essential (primary) hypertension: Secondary | ICD-10-CM

## 2018-08-24 DIAGNOSIS — F419 Anxiety disorder, unspecified: Secondary | ICD-10-CM | POA: Diagnosis not present

## 2018-08-24 MED ORDER — FLUTICASONE FUROATE-VILANTEROL 100-25 MCG/INH IN AEPB: INHALATION_SPRAY | RESPIRATORY_TRACT | 1 refills | Status: DC

## 2018-08-24 MED ORDER — CITALOPRAM HYDROBROMIDE 20 MG PO TABS: 20.0000 mg | ORAL_TABLET | Freq: Every day | ORAL | 3 refills | Status: DC

## 2018-08-24 NOTE — Progress Notes (Signed)
Patient: Lori Brady Female    DOB: 01-03-1946   72 y.o.   MRN: [id number] Visit Date: 08/24/2018  Today's Provider: Shirlee Latch, MD   Chief Complaint  Patient presents with  . Hypertension  . Abnormal Lab   Subjective:    HPI      Hypertension, follow-up:  BP Readings from Last 3 Encounters:  08/24/18 130/78  07/22/18 116/62  07/18/18 126/86    She was last seen for hypertension 1 months ago.  BP at that visit was 116/62. Management changes since that visit include patient was advised to continue holding blood pressure medication.  She is exercising. She is adherent to low salt diet.   Outside blood pressures have been fluctuating states the patient. Per patient when home nurse come out to house blood pressure readings have been in the pre-hypertensive range. She is experiencing no adverse reaction or intolerance to medications, she is currently not taking blood pressure medication. Cardiovascular risk factors include advanced age (older than 13 for men, 31 for women) and hypertension.  Use of agents associated with hypertension: NSAIDS.     Weight trend: stable Wt Readings from Last 3 Encounters:  08/24/18 142 lb (64.4 kg)  07/22/18 146 lb 12.8 oz (66.6 kg)  07/17/18 151 lb 14.4 oz (68.9 kg)    Current diet: in general, a "healthy" diet    ------------------------------------------------------------------------ Abnormal Lab Patient returns to office today for follow up of abnormal lab drawn on 07/22/18. Potassium level was at 3.2, patient reports good compliance and tolerance on Potassium .  She is doing well and anxiety is well controlled on Celexa.  She would like a refill.  She denies panic symptoms, SI, HI, depressed mood.   Allergies  Allergen Reactions  . Levaquin [Levofloxacin] Nausea Only  . Vicodin [Hydrocodone-Acetaminophen] Nausea Only     Current Outpatient Medications:  .  Abaloparatide (TYMLOS) 3120 MCG/1.56ML SOPN,  Inject into the skin daily. , Disp: , Rfl:  .  albuterol (ACCUNEB) 0.63 MG/3ML nebulizer solution, Take 1 ampule by nebulization every 6 (six) hours as needed for wheezing., Disp: , Rfl:  .  albuterol (PROVENTIL HFA;VENTOLIN HFA) 108 (90 Base) MCG/ACT inhaler, Inhale 2 puffs into the lungs every 6 (six) hours as needed for wheezing or shortness of breath., Disp: 1 Inhaler, Rfl: 0 .  aspirin 81 MG tablet, Take 81 mg by mouth daily., Disp: , Rfl:  .  betamethasone valerate (VALISONE) 0.1 % cream, APPLY TOPICALLY TO AFFECTED AREA SPARINGLY 2 TIMES DAILY., Disp: 15 g, Rfl: 5 .  BREO ELLIPTA 100-25 MCG/INH AEPB, USE 1 PUFF ONCE DAILY AS DIRECTED, Disp: 180 each, Rfl: 1 .  carbidopa-levodopa (SINEMET CR) 50-200 MG tablet, Take 1 tablet by mouth daily., Disp: , Rfl:  .  carbidopa-levodopa (SINEMET IR) 25-100 MG tablet, Take 2 tablets by mouth 3 (three) times daily. , Disp: , Rfl:  .  cetirizine (ZYRTEC) 10 MG tablet, Take 1 tablet (10 mg total) by mouth daily., Disp: 30 tablet, Rfl: 11 .  Cholecalciferol (VITAMIN D) 2000 UNITS CAPS, Take 1 capsule by mouth daily., Disp: , Rfl:  .  citalopram (CELEXA) 20 MG tablet, Take 1 tablet (20 mg total) by mouth daily., Disp: 90 tablet, Rfl: 3 .  fluticasone (FLONASE) 50 MCG/ACT nasal spray, PLACE TO SPRAYS INTO BOTHS NOSTRILS DAILY, Disp: 16 g, Rfl: 6 .  montelukast (SINGULAIR) 10 MG tablet, TAKE 1 TABLET BY MOUTH ONCE DAILY, Disp: 90 tablet, Rfl: 1 .  nystatin (MYCOSTATIN) 100000 UNIT/ML suspension, Take 5 mLs (500,000 Units total) by mouth 4 (four) times daily. Until resolution, Disp: 300 mL, Rfl: 0 .  Omega-3 Fatty Acids (FISH OIL) 1000 MG CAPS, Take 1 capsule by mouth daily., Disp: , Rfl:  .  potassium chloride SA (K-DUR,KLOR-CON) 20 MEQ tablet, Take 1 tablet (20 mEq total) by mouth daily., Disp: 60 tablet, Rfl: 5 .  simvastatin (ZOCOR) 40 MG tablet, TAKE 1 TABLET BY MOUTH AT BEDTIME, Disp: 90 tablet, Rfl: 3 .  Tiotropium Bromide Monohydrate (SPIRIVA RESPIMAT)  2.5 MCG/ACT AERS, Inhale 2 puffs into the lungs daily., Disp: , Rfl:   Review of Systems  HENT: Positive for nosebleeds and rhinorrhea.   Eyes: Negative.   Respiratory: Positive for cough and shortness of breath.   Gastrointestinal: Negative.   Endocrine: Negative.   Genitourinary: Negative.   Musculoskeletal: Negative.   Allergic/Immunologic: Positive for environmental allergies.  All other systems reviewed and are negative.   Social History   Tobacco Use  . Smoking status: Former Smoker    Packs/day: 1.00    Years: 30.00    Pack years: 30.00    Types: Cigarettes    Last attempt to quit: 08/01/2008    Years since quitting: 10.0  . Smokeless tobacco: Never Used  Substance Use Topics  . Alcohol use: Yes    Alcohol/week: 1.0 standard drinks    Types: 1 Shots of liquor per week   Objective:   BP 130/78   Pulse 99   Temp 98.8 F (37.1 C) (Oral)   Resp 16   Wt 142 lb (64.4 kg)   SpO2 96%   BMI 26.83 kg/m  Vitals:   08/24/18 1050  BP: 130/78  Pulse: 99  Resp: 16  Temp: 98.8 F (37.1 C)  TempSrc: Oral  SpO2: 96%  Weight: 142 lb (64.4 kg)     Physical Exam  Constitutional: She is oriented to person, place, and time. She appears well-developed and well-nourished. No distress.  HENT:  Head: Normocephalic and atraumatic.  Mouth/Throat: Oropharynx is clear and moist.  Eyes: Pupils are equal, round, and reactive to light. Conjunctivae are normal. No scleral icterus.  Neck: Neck supple. No thyromegaly present.  Cardiovascular: Normal rate, regular rhythm, normal heart sounds and intact distal pulses.  No murmur heard. Pulmonary/Chest: Effort normal and breath sounds normal. No respiratory distress. She has no wheezes. She has no rales.  Musculoskeletal: She exhibits no edema.  Lymphadenopathy:    She has no cervical adenopathy.  Neurological: She is alert and oriented to person, place, and time.  Skin: Skin is warm and dry. Capillary refill takes less than 2  seconds. No rash noted.  Psychiatric: She has a normal mood and affect. Her behavior is normal.  Vitals reviewed.       Assessment & Plan:   Problem List Items Addressed This Visit      Cardiovascular and Mediastinum   Benign essential HTN - Primary    Well controlled Had a home reading by PT that was elevated, but was normal on her automatic cuff Will continue to hold lisinopril and chlorthalidone at this time Monitor home BP and let me know if it starts increasing F/u in 3 months        Other   Anxiety    Well controlled Continue Celexa at current dose      Relevant Medications   citalopram (CELEXA) 20 MG tablet   Hypokalemia    Continue potassium supplementation Recheck RFP Titrate  supplement pending labs May need to change supplement if resumes any antihypertensives in the future      Relevant Orders   Renal Function Panel       Return in about 3 months (around 11/24/2018) for BP f/u.   The entirety of the information documented in the History of Present Illness, Review of Systems and Physical Exam were personally obtained by me. Portions of this information were initially documented by Nadene Rubins, CMA and reviewed by me for thoroughness and accuracy.    Erasmo Downer, MD, MPH [facility] 08/24/2018 11:13 AM

## 2018-08-24 NOTE — Assessment & Plan Note (Signed)
Continue potassium supplementation Recheck RFP Titrate supplement pending labs May need to change supplement if resumes any antihypertensives in the future

## 2018-08-24 NOTE — Patient Instructions (Signed)
Continue current meds with no changes.  Continue to hold chlorthalidone and lisinopril  We will call with potassium value likely tomorrow

## 2018-08-24 NOTE — Assessment & Plan Note (Signed)
Well controlled Continue Celexa at current dose

## 2018-08-24 NOTE — Assessment & Plan Note (Signed)
Well controlled Had a home reading by PT that was elevated, but was normal on her automatic cuff Will continue to hold lisinopril and chlorthalidone at this time Monitor home BP and let me know if it starts increasing F/u in 3 months

## 2018-08-25 ENCOUNTER — Telehealth: Payer: Self-pay

## 2018-08-25 LAB — RENAL FUNCTION PANEL
ALBUMIN: 4 g/dL (ref 3.5–4.8)
BUN/Creatinine Ratio: 12 (ref 12–28)
BUN: 10 mg/dL (ref 8–27)
CHLORIDE: 95 mmol/L — AB (ref 96–106)
CO2: 22 mmol/L (ref 20–29)
Calcium: 9.3 mg/dL (ref 8.7–10.3)
Creatinine, Ser: 0.84 mg/dL (ref 0.57–1.00)
GFR, EST AFRICAN AMERICAN: 81 mL/min/{1.73_m2} (ref >59)
GFR, EST NON AFRICAN AMERICAN: 71 mL/min/{1.73_m2} (ref >59)
GLUCOSE: 124 mg/dL — AB (ref 65–99)
PHOSPHORUS: 3.1 mg/dL (ref 2.5–4.5)
Potassium: 4.5 mmol/L (ref 3.5–5.2)
SODIUM: 135 mmol/L (ref 134–144)

## 2018-08-25 NOTE — Telephone Encounter (Signed)
LMTCB

## 2018-08-25 NOTE — Telephone Encounter (Signed)
-----   Message from Erasmo Downer, MD sent at 08/25/2018 10:13 AM EDT ----- Potassium is normal.  Continue current supplement  Beryle Flock Marzella Schlein, MD, MPH [facility] 08/25/2018 10:13 AM

## 2018-08-25 NOTE — Telephone Encounter (Signed)
Patient advised.

## 2018-10-05 ENCOUNTER — Other Ambulatory Visit: Payer: Self-pay | Admitting: Internal Medicine

## 2018-10-05 DIAGNOSIS — J449 Chronic obstructive pulmonary disease, unspecified: Secondary | ICD-10-CM

## 2018-11-24 ENCOUNTER — Encounter: Payer: Self-pay | Admitting: Family Medicine

## 2018-11-24 ENCOUNTER — Ambulatory Visit: Payer: Medicare Other | Admitting: Family Medicine

## 2018-11-24 VITALS — BP 130/87 | HR 95 | Temp 98.4°F | Wt 141.2 lb

## 2018-11-24 DIAGNOSIS — I10 Essential (primary) hypertension: Secondary | ICD-10-CM

## 2018-11-24 DIAGNOSIS — J449 Chronic obstructive pulmonary disease, unspecified: Secondary | ICD-10-CM

## 2018-11-24 DIAGNOSIS — E876 Hypokalemia: Secondary | ICD-10-CM

## 2018-11-24 NOTE — Assessment & Plan Note (Signed)
Remains well controlled Continue to hold lisinopril and chlorthalidone at this time Monitor home BP and let me know if it starts increasing F/u in 6 months

## 2018-11-24 NOTE — Assessment & Plan Note (Signed)
Continue K supplement Recheck BMP Titrate supplement pending lab results May need to change supplement if resumes antihypertensive in the future

## 2018-11-24 NOTE — Assessment & Plan Note (Signed)
Chronic and well controlled Continue controller inhalers No exacerbations since hospitalization Discussed return precautions

## 2018-11-24 NOTE — Progress Notes (Signed)
Patient: Lori Brady Female    DOB: Nov 09, 1946   73 y.o.   MRN: [id number] Visit Date: 11/24/2018  Today's Provider: Shirlee LatchAngela Vanisha Whiten, MD   Chief Complaint  Patient presents with  . Hypertension   Subjective:    I, Presley RaddleNikki Walston, CMA, am acting as a Neurosurgeonscribe for Shirlee LatchAngela Blade Scheff, MD.   HPI  Hypertension, follow-up:     BP Readings from Last 3 Encounters:  08/24/18 130/78  07/22/18 116/62  07/18/18 126/86   She was last seen for hypertension 3 months ago.  BP at that visit was 130/78. Management changes since that visit include monitor home BP. She is exercising. She is adherent to low salt diet.   Outside blood pressures being checked She is experiencing no adverse reaction or intolerance to medications, she is currently not taking blood pressure medication. Cardiovascular risk factors include advanced age (older than 1555 for men, 5365 for women) and hypertension.  Use of agents associated with hypertension: NSAIDS.                Weight trend: stable    Wt Readings from Last 3 Encounters:  08/24/18 142 lb (64.4 kg)  07/22/18 146 lb 12.8 oz (66.6 kg)  07/17/18 151 lb 14.4 oz (68.9 kg)    Current diet: in general, a "healthy" diet    Her medications were held several months ago after being hypotensive.  BP remains normal despite no medications   Allergies  Allergen Reactions  . Levaquin [Levofloxacin] Nausea Only  . Vicodin [Hydrocodone-Acetaminophen] Nausea Only     Current Outpatient Medications:  .  Abaloparatide (TYMLOS) 3120 MCG/1.56ML SOPN, Inject into the skin daily. , Disp: , Rfl:  .  albuterol (ACCUNEB) 0.63 MG/3ML nebulizer solution, Take 1 ampule by nebulization every 6 (six) hours as needed for wheezing., Disp: , Rfl:  .  albuterol (PROVENTIL HFA;VENTOLIN HFA) 108 (90 Base) MCG/ACT inhaler, INHALE 2 PUFFS INTO THE LUNGS EVERY 6 HOURS AS NEEDED FOR WHEEZING ORSHORTNESS OF BREATH, Disp: 8.5 g, Rfl: 2 .  aspirin 81 MG tablet, Take 81 mg by  mouth daily., Disp: , Rfl:  .  betamethasone valerate (VALISONE) 0.1 % cream, APPLY TOPICALLY TO AFFECTED AREA SPARINGLY 2 TIMES DAILY., Disp: 15 g, Rfl: 5 .  carbidopa-levodopa (SINEMET CR) 50-200 MG tablet, Take 1 tablet by mouth daily., Disp: , Rfl:  .  cetirizine (ZYRTEC) 10 MG tablet, Take 1 tablet (10 mg total) by mouth daily., Disp: 30 tablet, Rfl: 11 .  Cholecalciferol (VITAMIN D) 2000 UNITS CAPS, Take 1 capsule by mouth daily., Disp: , Rfl:  .  citalopram (CELEXA) 20 MG tablet, Take 1 tablet (20 mg total) by mouth daily., Disp: 90 tablet, Rfl: 3 .  fluticasone (FLONASE) 50 MCG/ACT nasal spray, PLACE TO SPRAYS INTO BOTHS NOSTRILS DAILY, Disp: 16 g, Rfl: 6 .  fluticasone furoate-vilanterol (BREO ELLIPTA) 100-25 MCG/INH AEPB, USE 1 PUFF ONCE DAILY AS DIRECTED, Disp: 180 each, Rfl: 1 .  montelukast (SINGULAIR) 10 MG tablet, TAKE 1 TABLET BY MOUTH ONCE DAILY, Disp: 90 tablet, Rfl: 1 .  nystatin (MYCOSTATIN) 100000 UNIT/ML suspension, Take 5 mLs (500,000 Units total) by mouth 4 (four) times daily. Until resolution, Disp: 300 mL, Rfl: 0 .  Omega-3 Fatty Acids (FISH OIL) 1000 MG CAPS, Take 1 capsule by mouth daily., Disp: , Rfl:  .  potassium chloride SA (K-DUR,KLOR-CON) 20 MEQ tablet, Take 1 tablet (20 mEq total) by mouth daily., Disp: 60 tablet, Rfl: 5 .  simvastatin (  ZOCOR) 40 MG tablet, TAKE 1 TABLET BY MOUTH AT BEDTIME, Disp: 90 tablet, Rfl: 3 .  Tiotropium Bromide Monohydrate (SPIRIVA RESPIMAT) 2.5 MCG/ACT AERS, Inhale 2 puffs into the lungs daily., Disp: , Rfl:  .  carbidopa-levodopa (SINEMET IR) 25-100 MG tablet, Take 2 tablets by mouth 3 (three) times daily. , Disp: , Rfl:   Review of Systems  Constitutional: Negative.   Respiratory: Negative.   Cardiovascular: Negative.   Musculoskeletal: Negative.   Neurological: Negative.   Psychiatric/Behavioral: Negative.     Social History   Tobacco Use  . Smoking status: Former Smoker    Packs/day: 1.00    Years: 30.00    Pack years:  30.00    Types: Cigarettes    Last attempt to quit: 08/01/2008    Years since quitting: 10.3  . Smokeless tobacco: Never Used  Substance Use Topics  . Alcohol use: Yes    Alcohol/week: 1.0 standard drinks    Types: 1 Shots of liquor per week      Objective:   BP 130/87 (BP Location: Left Arm, Patient Position: Sitting, Cuff Size: Normal)   Pulse 95   Temp 98.4 F (36.9 C) (Oral)   Wt 141 lb 3.2 oz (64 kg)   SpO2 98%   BMI 26.68 kg/m  Vitals:   11/24/18 1058  BP: 130/87  Pulse: 95  Temp: 98.4 F (36.9 C)  TempSrc: Oral  SpO2: 98%  Weight: 141 lb 3.2 oz (64 kg)     Physical Exam Vitals signs reviewed.  Constitutional:      General: She is not in acute distress.    Appearance: Normal appearance.  HENT:     Head: Normocephalic and atraumatic.     Right Ear: External ear normal.     Left Ear: External ear normal.     Nose: Nose normal.     Mouth/Throat:     Pharynx: Oropharynx is clear.  Eyes:     General: No scleral icterus.    Conjunctiva/sclera: Conjunctivae normal.  Neck:     Musculoskeletal: Neck supple.  Cardiovascular:     Rate and Rhythm: Normal rate and regular rhythm.     Pulses: Normal pulses.     Heart sounds: Normal heart sounds.  Pulmonary:     Effort: Pulmonary effort is normal. No respiratory distress.     Breath sounds: Normal breath sounds. No wheezing or rhonchi.  Abdominal:     General: There is no distension.     Palpations: Abdomen is soft.     Tenderness: There is no abdominal tenderness.  Musculoskeletal:     Right lower leg: No edema.     Left lower leg: No edema.  Lymphadenopathy:     Cervical: No cervical adenopathy.  Skin:    General: Skin is warm and dry.     Capillary Refill: Capillary refill takes less than 2 seconds.     Findings: No rash.  Neurological:     Mental Status: She is alert and oriented to person, place, and time. Mental status is at baseline.  Psychiatric:        Mood and Affect: Mood normal.         Behavior: Behavior normal.       Assessment & Plan   Problem List Items Addressed This Visit      Cardiovascular and Mediastinum   Benign essential HTN - Primary    Remains well controlled Continue to hold lisinopril and chlorthalidone at this time Monitor home BP  and let me know if it starts increasing F/u in 6 months      Relevant Orders   Basic Metabolic Panel (BMET)     Respiratory   COPD (chronic obstructive pulmonary disease) with chronic bronchitis (HCC)    Chronic and well controlled Continue controller inhalers No exacerbations since hospitalization Discussed return precautions        Other   Hypokalemia    Continue K supplement Recheck BMP Titrate supplement pending lab results May need to change supplement if resumes antihypertensive in the future      Relevant Orders   Basic Metabolic Panel (BMET)       Return in about 6 months (around 05/25/2019) for chronic disease f/u.   The entirety of the information documented in the History of Present Illness, Review of Systems and Physical Exam were personally obtained by me. Portions of this information were initially documented by Presley Raddle, CMA and reviewed by me for thoroughness and accuracy.    Erasmo Downer, MD, MPH [facility] 11/24/2018 12:57 PM

## 2018-11-25 LAB — BASIC METABOLIC PANEL
BUN / CREAT RATIO: 18 (ref 12–28)
BUN: 15 mg/dL (ref 8–27)
CO2: 23 mmol/L (ref 20–29)
CREATININE: 0.85 mg/dL (ref 0.57–1.00)
Calcium: 9.5 mg/dL (ref 8.7–10.3)
Chloride: 96 mmol/L (ref 96–106)
GFR, EST AFRICAN AMERICAN: 80 mL/min/{1.73_m2} (ref >59)
GFR, EST NON AFRICAN AMERICAN: 70 mL/min/{1.73_m2} (ref >59)
Glucose: 115 mg/dL — ABNORMAL HIGH (ref 65–99)
Potassium: 5 mmol/L (ref 3.5–5.2)
SODIUM: 136 mmol/L (ref 134–144)

## 2018-12-05 ENCOUNTER — Other Ambulatory Visit: Payer: Self-pay | Admitting: Physician Assistant

## 2018-12-08 ENCOUNTER — Encounter: Payer: Self-pay | Admitting: Internal Medicine

## 2018-12-08 ENCOUNTER — Ambulatory Visit: Payer: Medicare Other | Admitting: Internal Medicine

## 2018-12-08 VITALS — BP 120/70 | HR 94 | Resp 16 | Ht 61.0 in | Wt 143.0 lb

## 2018-12-08 DIAGNOSIS — K219 Gastro-esophageal reflux disease without esophagitis: Secondary | ICD-10-CM

## 2018-12-08 DIAGNOSIS — I2510 Atherosclerotic heart disease of native coronary artery without angina pectoris: Secondary | ICD-10-CM

## 2018-12-08 DIAGNOSIS — R0602 Shortness of breath: Secondary | ICD-10-CM

## 2018-12-08 DIAGNOSIS — J449 Chronic obstructive pulmonary disease, unspecified: Secondary | ICD-10-CM | POA: Diagnosis not present

## 2018-12-08 DIAGNOSIS — J4489 Other specified chronic obstructive pulmonary disease: Secondary | ICD-10-CM

## 2018-12-08 DIAGNOSIS — G20 Parkinson's disease: Secondary | ICD-10-CM | POA: Diagnosis not present

## 2018-12-08 DIAGNOSIS — I2583 Coronary atherosclerosis due to lipid rich plaque: Secondary | ICD-10-CM

## 2018-12-08 DIAGNOSIS — G20C Parkinsonism, unspecified: Secondary | ICD-10-CM

## 2018-12-08 NOTE — Progress Notes (Signed)
[facility] [place], [place] [address]  Pulmonary Sleep Medicine   Office Visit Note  Patient Name: Lori Brady DOB: [birth year] MRN [id number]  Date of Service: 12/08/2018  Complaints/HPI: COPD CAD Parkinsons GERD she is doing well overall with her sleep.  She has had issues with the Parkinson's has been gradually showing some decline in status.  She is taking her medications as prescribed.  She continues to take the meds for her COPD also  ROS  General: (-) fever, (-) chills, (-) night sweats, (-) weakness Skin: (-) rashes, (-) itching,. Eyes: (-) visual changes, (-) redness, (-) itching. Nose and Sinuses: (-) nasal stuffiness or itchiness, (-) postnasal drip, (-) nosebleeds, (-) sinus trouble. Mouth and Throat: (-) sore throat, (-) hoarseness. Neck: (-) swollen glands, (-) enlarged thyroid, (-) neck pain. Respiratory: + cough, (-) bloody sputum, + shortness of breath, - wheezing. Cardiovascular: - ankle swelling, (-) chest pain. Lymphatic: (-) lymph node enlargement. Neurologic: (-) numbness, (-) tingling. Psychiatric: (-) anxiety, (-) depression   Current Medication: Outpatient Encounter Medications as of 12/08/2018  Medication Sig  . Abaloparatide (TYMLOS) 3120 MCG/1.56ML SOPN Inject into the skin daily.   Marland Kitchen albuterol (ACCUNEB) 0.63 MG/3ML nebulizer solution Take 1 ampule by nebulization every 6 (six) hours as needed for wheezing.  Marland Kitchen albuterol (PROVENTIL HFA;VENTOLIN HFA) 108 (90 Base) MCG/ACT inhaler INHALE 2 PUFFS INTO THE LUNGS EVERY 6 HOURS AS NEEDED FOR WHEEZING ORSHORTNESS OF BREATH  . aspirin 81 MG tablet Take 81 mg by mouth daily.  . betamethasone valerate (VALISONE) 0.1 % cream APPLY TOPICALLY TO AFFECTED AREA SPARINGLY 2 TIMES DAILY.  . carbidopa-levodopa (SINEMET CR) 50-200 MG tablet Take 1 tablet by mouth daily.  . cetirizine (ZYRTEC) 10 MG tablet Take 1 tablet (10 mg total) by mouth daily.  . Cholecalciferol (VITAMIN D) 2000 UNITS  CAPS Take 1 capsule by mouth daily.  . citalopram (CELEXA) 20 MG tablet Take 1 tablet (20 mg total) by mouth daily.  . fluticasone (FLONASE) 50 MCG/ACT nasal spray PLACE TO SPRAYS INTO BOTHS NOSTRILS DAILY  . fluticasone furoate-vilanterol (BREO ELLIPTA) 100-25 MCG/INH AEPB USE 1 PUFF ONCE DAILY AS DIRECTED  . montelukast (SINGULAIR) 10 MG tablet TAKE 1 TABLET BY MOUTH ONCE DAILY  . nystatin (MYCOSTATIN) 100000 UNIT/ML suspension Take 5 mLs (500,000 Units total) by mouth 4 (four) times daily. Until resolution  . Omega-3 Fatty Acids (FISH OIL) 1000 MG CAPS Take 1 capsule by mouth daily.  . potassium chloride SA (K-DUR,KLOR-CON) 20 MEQ tablet Take 1 tablet (20 mEq total) by mouth daily.  . simvastatin (ZOCOR) 40 MG tablet TAKE 1 TABLET BY MOUTH AT BEDTIME  . Tiotropium Bromide Monohydrate (SPIRIVA RESPIMAT) 2.5 MCG/ACT AERS Inhale 2 puffs into the lungs daily.  . carbidopa-levodopa (SINEMET IR) 25-100 MG tablet Take 2 tablets by mouth 3 (three) times daily.    No facility-administered encounter medications on file as of 12/08/2018.     Surgical History: Past Surgical History:  Procedure Laterality Date  . BREAST BIOPSY Bilateral 1960's to 90's  . breast cysts    . CATARACT EXTRACTION, BILATERAL    . right wrist fracture    . TUBAL LIGATION      Medical History: Past Medical History:  Diagnosis Date  . Arthritis   . Asthma   . Crohn's disease ([place])   . Emphysema of lung ([place])   . GERD (gastroesophageal reflux disease)   . Hip fracture ([place]) 01/26/2017  . History of chicken pox   .  History of heart attack   . Hx of completed stroke   . Hypercholesteremia   . Hypertension   . Parkinson disease ([place])   . Pneumonia   . Seasonal allergies   . Sepsis ([place])   . Septic shock ([place]) 10/08/2016  . UTI (lower urinary tract infection)     Family History: Family History  Problem Relation Age of Onset  . Cancer Sister        ovarian/uterine/breast  . Breast cancer Sister 41  . Cancer  Sister        breast  . Heart disease Sister   . Breast cancer Sister 82  . Heart disease Father   . Cancer Father        liver  . Pancreatic cancer Mother   . Heart disease Brother   . Heart attack Brother   . Cancer Brother        liver  . Heart disease Sister   . Diabetes Maternal Grandmother     Social History: Social History   Socioeconomic History  . Marital status: Widowed    Spouse name: Not on file  . Number of children: 3  . Years of education: Not on file  . Highest education level: Some college, no degree  Occupational History  . Occupation: retired  Scientific laboratory technician  . Financial resource strain: Not hard at all  . Food insecurity:    Worry: Never true    Inability: Never true  . Transportation needs:    Medical: No    Non-medical: No  Tobacco Use  . Smoking status: Former Smoker    Packs/day: 1.00    Years: 30.00    Pack years: 30.00    Types: Cigarettes    Last attempt to quit: 08/01/2008    Years since quitting: 10.3  . Smokeless tobacco: Never Used  Substance and Sexual Activity  . Alcohol use: Yes    Alcohol/week: 1.0 standard drinks    Types: 1 Shots of liquor per week  . Drug use: No  . Sexual activity: Not on file  Lifestyle  . Physical activity:    Days per week: Not on file    Minutes per session: Not on file  . Stress: Not at all  Relationships  . Social connections:    Talks on phone: Not on file    Gets together: Not on file    Attends religious service: Not on file    Active member of club or organization: Not on file    Attends meetings of clubs or organizations: Not on file    Relationship status: Not on file  . Intimate partner violence:    Fear of current or ex partner: Not on file    Emotionally abused: Not on file    Physically abused: Not on file    Forced sexual activity: Not on file  Other Topics Concern  . Not on file  Social History Narrative  . Not on file    Vital Signs: Blood pressure 120/70, pulse 94, resp.  rate 16, height 5' 1"  (1.549 m), weight 143 lb (64.9 kg), SpO2 97 %.  Examination: General Appearance: The patient is well-developed, well-nourished, and in no distress. Skin: Gross inspection of skin unremarkable. Head: normocephalic, no gross deformities. Eyes: no gross deformities noted. ENT: ears appear grossly normal no exudates. Neck: Supple. No thyromegaly. No LAD. Respiratory: no rhonchi noted at this time. Cardiovascular: Normal S1 and S2 without murmur or rub. Extremities: No cyanosis. pulses are  equal. Neurologic: Alert and oriented. No involuntary movements.  LABS: Recent Results (from the past 2160 hour(s))  Basic Metabolic Panel (BMET)     Status: Abnormal   Collection Time: 11/24/18 11:19 AM  Result Value Ref Range   Glucose 115 (H) 65 - 99 mg/dL   BUN 15 8 - 27 mg/dL   Creatinine, Ser 0.85 0.57 - 1.00 mg/dL   GFR calc non Af Amer 70 >59 mL/min/1.73   GFR calc Af Amer 80 >59 mL/min/1.73   BUN/Creatinine Ratio 18 12 - 28   Sodium 136 134 - 144 mmol/L   Potassium 5.0 3.5 - 5.2 mmol/L   Chloride 96 96 - 106 mmol/L   CO2 23 20 - 29 mmol/L   Calcium 9.5 8.7 - 10.3 mg/dL    Radiology: Ct Abdomen Pelvis Wo Contrast  Result Date: 07/16/2018 CLINICAL DATA:  Nausea and vomiting today.  Altered mental status. EXAM: CT ABDOMEN AND PELVIS WITHOUT CONTRAST TECHNIQUE: Multidetector CT imaging of the abdomen and pelvis was performed following the standard protocol without IV contrast. COMPARISON:  CT of the pelvis on 01/26/2017 FINDINGS: Lower chest: Coronary artery calcifications are present. No pulmonary nodules, pleural effusions, or infiltrates. Hepatobiliary: There is diffuse low-attenuation of the liver. No focal liver lesions. Gallbladder is present. Pancreas: Unremarkable. No pancreatic ductal dilatation or surrounding inflammatory changes. Spleen: Normal in size without focal abnormality. Adrenals/Urinary Tract: Adrenal glands are normal in appearance. There are small  bilateral renal cysts. No intrarenal calculi in or ureteral obstruction. The bladder and visualized urethra are unremarkable. Stomach/Bowel: Small hiatal hernia. The stomach is otherwise normal. Small bowel loops are normal in appearance. There is fatty deposition within the wall of the colon. The colon is decompressed. No suspicious mass identified. Vascular/Lymphatic: There is atherosclerotic calcification of the abdominal aorta not associated with aneurysm. No retroperitoneal or mesenteric adenopathy. Reproductive: The uterus is present.  No adnexal mass. Other: No free pelvic fluid. Anterior abdominal wall is unremarkable. Musculoskeletal: There are chronic wedge compression fractures of T9 and L5. There is generalized sclerosis of the sacrum, raising the question of remote versus insufficiency fractures. Remote fractures of the inferior pubic rami bilaterally. IMPRESSION: 1. Hepatic steatosis.  No focal liver lesions. 2. Chronic changes in the wall of the colon, consistent with chronic inflammation. No acute inflammation or acute diverticulitis. 3. Coronary artery calcifications. Aortic atherosclerosis. (ICD10-I70.0) 4. Small hiatal hernia. 5. Chronic pelvic fractures. Sclerosis of the sacrum suggests remote sacral fracture or insufficiency fractures. Electronically Signed   By: Nolon Nations M.D.   On: 07/16/2018 16:12   Dg Chest 1 View  Result Date: 07/17/2018 CLINICAL DATA:  Short of breath.  Asthma EXAM: CHEST  1 VIEW COMPARISON:  07/06/2018 FINDINGS: Heart size mildly enlarged. Negative for heart failure. Atherosclerotic aorta. Small right effusion Question of bilateral small lung nodules right greater than left, not definitely seen on prior studies. Multiple chronic rib fractures on the right. IMPRESSION: Small right pleural effusion. Negative for heart failure or pneumonia Question lung nodules bilaterally right greater than left. Follow-up chest two-view for further evaluation. Chest CT may also  be necessary to resolve Electronically Signed   By: Franchot Gallo M.D.   On: 07/17/2018 15:01   Ct Head Wo Contrast  Result Date: 07/16/2018 CLINICAL DATA:  Acute onset nausea and vomiting today.  Headache. EXAM: CT HEAD WITHOUT CONTRAST TECHNIQUE: Contiguous axial images were obtained from the base of the skull through the vertex without intravenous contrast. COMPARISON:  Brain MRI 11/03/2017. FINDINGS:  Brain: No evidence of acute infarction, hemorrhage, hydrocephalus, extra-axial collection or mass lesion/mass effect. Atrophy, chronic microvascular ischemic change and remote left basal ganglia infarct are identified. Vascular: Extensive atherosclerotic vascular disease is seen. Skull: Intact.  No focal lesion. Sinuses/Orbits: Status post bilateral lens extraction. Otherwise negative. Other: None. IMPRESSION: No acute abnormality. Atrophy, chronic microvascular ischemic change remote left basal ganglia infarct. Atherosclerosis. Electronically Signed   By: Inge Rise M.D.   On: 07/16/2018 13:09   Dg Abdomen Acute W/chest  Result Date: 07/16/2018 CLINICAL DATA:  Emesis, emphysema, former smoker EXAM: DG ABDOMEN ACUTE W/ 1V CHEST COMPARISON:  03/04/2017 FINDINGS: Normal heart size and vascularity. Lungs remain clear. No focal pneumonia, collapse or consolidation. Negative for edema, effusion or pneumothorax. Trachea is midline. Aorta is atherosclerotic. No free air. Scattered air throughout the bowel. Negative for significant dilatation or obstruction. Remote healed inferior rami fractures noted. Bones are osteopenic. IMPRESSION: No acute or significant finding by plain radiography. Electronically Signed   By: Jerilynn Mages.  Shick M.D.   On: 07/16/2018 09:41    No results found.  No results found.    Assessment and Plan: Patient Active Problem List   Diagnosis Date Noted  . Hypokalemia 07/22/2018  . Oral thrush 07/22/2018  . Gastroenteritis 07/16/2018  . Stroke ([place]) 03/07/2018  . Primary  Parkinsonism ([place]) 01/05/2018  . SIADH (syndrome of inappropriate ADH production) ([place]) 10/01/2017  . Spinal stenosis 10/01/2017  . Elevated troponin I level 11/18/2016  . COPD (chronic obstructive pulmonary disease) with chronic bronchitis ([place]) 08/12/2016  . Asthma without status asthmaticus 03/31/2016  . Osteoporosis 03/31/2016  . Fatty liver determined by biopsy 09/17/2015  . Parotid swelling 09/13/2015  . Urticaria 09/13/2015  . Cerebrovascular accident (CVA) ([place]) 08/21/2015  . Senile purpura ([place]) 08/21/2015  . Atrial tachycardia ([place]) 08/13/2015  . Eczema 07/10/2015  . Allergic rhinitis 06/26/2015  . Anxiety 04/23/2015  . Edema leg 03/27/2015  . Generalized weakness 03/06/2015  . Primary cardiomyopathy ([place]) 03/05/2015  . CAD (coronary artery disease) 03/05/2015  . Benign essential HTN 02/28/2015  . Combined fat and carbohydrate induced hyperlipemia 09/06/2014  . Acid reflux 08/01/2014    1. COPD continue with current therapy she will need follow-up pulmonary function study to check for any progression or worsening of her disease 2. Parkinsons continue with current meds.  She and I spoke about the side effects of the medications including the postural hypotension which she states that she has noted on occasion 3. CAD stable continue with supportive care 4. GERD controlled we will continue present management  General Counseling: I have discussed the findings of the evaluation and examination with E[place].  I have also discussed any further diagnostic evaluation thatmay be needed or ordered today. Deserie verbalizes understanding of the findings of todays visit. We also reviewed her medications today and discussed drug interactions and side effects including but not limited excessive drowsiness and altered mental states. We also discussed that there is always a risk not just to her but also people around her. she has been encouraged to call the office with any questions or concerns  that should arise related to todays visit.    Time spent: 15 minutes  I have personally obtained a history, examined the patient, evaluated laboratory and imaging results, formulated the assessment and plan and placed orders.    Allyne Gee, MD [facility] Pulmonary and Critical Care Sleep medicine

## 2018-12-08 NOTE — Patient Instructions (Signed)
Chronic Obstructive Pulmonary Disease Chronic obstructive pulmonary disease (COPD) is a long-term (chronic) lung problem. When you have COPD, it is hard for air to get in and out of your lungs. Usually the condition gets worse over time, and your lungs will never return to normal. There are things you can do to keep yourself as healthy as possible.  Your doctor may treat your condition with: ? Medicines. ? Oxygen. ? Lung surgery.  Your doctor may also recommend: ? Rehabilitation. This includes steps to make your body work better. It may involve a team of specialists. ? Quitting smoking, if you smoke. ? Exercise and changes to your diet. ? Comfort measures (palliative care). Follow these instructions at home: Medicines  Take over-the-counter and prescription medicines only as told by your doctor.  Talk to your doctor before taking any cough or allergy medicines. You may need to avoid medicines that cause your lungs to be dry. Lifestyle  If you smoke, stop. Smoking makes the problem worse. If you need help quitting, ask your doctor.  Avoid being around things that make your breathing worse. This may include smoke, chemicals, and fumes.  Stay active, but remember to rest as well.  Learn and use tips on how to relax.  Make sure you get enough sleep. Most adults need at least 7 hours of sleep every night.  Eat healthy foods. Eat smaller meals more often. Rest before meals. Controlled breathing Learn and use tips on how to control your breathing as told by your doctor. Try:  Breathing in (inhaling) through your nose for 1 second. Then, pucker your lips and breath out (exhale) through your lips for 2 seconds.  Putting one hand on your belly (abdomen). Breathe in slowly through your nose for 1 second. Your hand on your belly should move out. Pucker your lips and breathe out slowly through your lips. Your hand on your belly should move in as you breathe out.  Controlled coughing Learn  and use controlled coughing to clear mucus from your lungs. Follow these steps: 1. Lean your head a little forward. 2. Breathe in deeply. 3. Try to hold your breath for 3 seconds. 4. Keep your mouth slightly open while coughing 2 times. 5. Spit any mucus out into a tissue. 6. Rest and do the steps again 1 or 2 times as needed. General instructions  Make sure you get all the shots (vaccines) that your doctor recommends. Ask your doctor about a flu shot and a pneumonia shot.  Use oxygen therapy and pulmonary rehabilitation if told by your doctor. If you need home oxygen therapy, ask your doctor if you should buy a tool to measure your oxygen level (oximeter).  Make a COPD action plan with your doctor. This helps you to know what to do if you feel worse than usual.  Manage any other conditions you have as told by your doctor.  Avoid going outside when it is very hot, cold, or humid.  Avoid people who have a sickness you can catch (contagious).  Keep all follow-up visits as told by your doctor. This is important. Contact a doctor if:  You cough up more mucus than usual.  There is a change in the color or thickness of the mucus.  It is harder to breathe than usual.  Your breathing is faster than usual.  You have trouble sleeping.  You need to use your medicines more often than usual.  You have trouble doing your normal activities such as getting dressed   or walking around the house. Get help right away if:  You have shortness of breath while resting.  You have shortness of breath that stops you from: ? Being able to talk. ? Doing normal activities.  Your chest hurts for longer than 5 minutes.  Your skin color is more blue than usual.  Your pulse oximeter shows that you have low oxygen for longer than 5 minutes.  You have a fever.  You feel too tired to breathe normally. Summary  Chronic obstructive pulmonary disease (COPD) is a long-term lung problem.  The way your  lungs work will never return to normal. Usually the condition gets worse over time. There are things you can do to keep yourself as healthy as possible.  Take over-the-counter and prescription medicines only as told by your doctor.  If you smoke, stop. Smoking makes the problem worse. This information is not intended to replace advice given to you by your health care provider. Make sure you discuss any questions you have with your health care provider. Document Released: 04/20/2008 Document Revised: 12/07/2016 Document Reviewed: 12/07/2016 Elsevier Interactive Patient Education  2019 [employer]  

## 2018-12-19 ENCOUNTER — Other Ambulatory Visit: Payer: Self-pay | Admitting: Internal Medicine

## 2019-01-05 ENCOUNTER — Ambulatory Visit: Payer: Self-pay

## 2019-01-05 ENCOUNTER — Encounter: Payer: Self-pay | Admitting: Physician Assistant

## 2019-01-18 ENCOUNTER — Other Ambulatory Visit: Payer: Self-pay | Admitting: Physician Assistant

## 2019-01-20 ENCOUNTER — Ambulatory Visit (INDEPENDENT_AMBULATORY_CARE_PROVIDER_SITE_OTHER): Payer: Medicare Other | Admitting: Physician Assistant

## 2019-01-20 ENCOUNTER — Encounter: Payer: Self-pay | Admitting: Physician Assistant

## 2019-01-20 ENCOUNTER — Ambulatory Visit (INDEPENDENT_AMBULATORY_CARE_PROVIDER_SITE_OTHER): Payer: Medicare Other

## 2019-01-20 VITALS — BP 156/72

## 2019-01-20 VITALS — BP 164/76 | HR 98 | Temp 98.6°F | Ht 61.0 in | Wt 144.8 lb

## 2019-01-20 DIAGNOSIS — Z0000 Encounter for general adult medical examination without abnormal findings: Secondary | ICD-10-CM | POA: Diagnosis not present

## 2019-01-20 DIAGNOSIS — Z1231 Encounter for screening mammogram for malignant neoplasm of breast: Secondary | ICD-10-CM

## 2019-01-20 DIAGNOSIS — I10 Essential (primary) hypertension: Secondary | ICD-10-CM

## 2019-01-20 DIAGNOSIS — Z1239 Encounter for other screening for malignant neoplasm of breast: Secondary | ICD-10-CM

## 2019-01-20 NOTE — Progress Notes (Signed)
Subjective:   Lori Brady is a 73 y.o. female who presents for Medicare Annual (Subsequent) preventive examination.  Review of Systems:  N/A  Cardiac Risk Factors include: advanced age (>21men, >93 women);hypertension;dyslipidemia     Objective:     Vitals: BP (!) 164/76 (BP Location: Right Arm)   Pulse 98   Temp 98.6 F (37 C) (Oral)   Ht 5\' 1"  (1.549 m)   Wt 144 lb 12.8 oz (65.7 kg)   BMI 27.36 kg/m   Body mass index is 27.36 kg/m.  Advanced Directives 01/20/2019 07/16/2018 02/07/2018 01/13/2018 01/11/2018 01/04/2018 01/26/2017  Does Patient Have a Medical Advance Directive? No No No No No No No  Would patient like information on creating a medical advance directive? No - Patient declined No - Patient declined No - Patient declined No - Patient declined - No - Patient declined No - Patient declined    Tobacco Social History   Tobacco Use  Smoking Status Former Smoker  . Packs/day: 1.00  . Years: 30.00  . Pack years: 30.00  . Types: Cigarettes  . Last attempt to quit: 08/01/2008  . Years since quitting: 10.4  Smokeless Tobacco Never Used     Counseling given: Not Answered   Clinical Intake:  Pre-visit preparation completed: Yes  Pain : No/denies pain Pain Score: 0-No pain     Nutritional Status: BMI 25 -29 Overweight Nutritional Risks: Nausea/ vomitting/ diarrhea(Diarrhea intermittenly due to Crohns Disease. ) Diabetes: No  How often do you need to have someone help you when you read instructions, pamphlets, or other written materials from your doctor or pharmacy?: 1 - Never  Interpreter Needed?: No  Information entered by :: [facility], LPN  Past Medical History:  Diagnosis Date  . Arthritis   . Asthma   . Crohn's disease (HCC)   . Emphysema of lung (HCC)   . GERD (gastroesophageal reflux disease)   . Hip fracture (HCC) 01/26/2017  . History of chicken pox   . History of heart attack   . Hx of completed stroke   . Hypercholesteremia   .  Hypertension   . Parkinson disease (HCC)   . Pneumonia   . Seasonal allergies   . Sepsis (HCC)   . Septic shock (HCC) 10/08/2016  . UTI (lower urinary tract infection)    Past Surgical History:  Procedure Laterality Date  . BREAST BIOPSY Bilateral 1960's to 90's  . breast cysts    . CATARACT EXTRACTION, BILATERAL    . right wrist fracture    . TUBAL LIGATION     Family History  Problem Relation Age of Onset  . Cancer Sister        ovarian/uterine/breast  . Breast cancer Sister 64  . Cancer Sister        breast  . Heart disease Sister   . Breast cancer Sister 29  . Heart disease Father   . Cancer Father        liver  . Pancreatic cancer Mother   . Heart disease Brother   . Heart attack Brother   . Cancer Brother        liver  . Heart disease Sister   . Diabetes Maternal Grandmother    Social History   Socioeconomic History  . Marital status: Widowed    Spouse name: Not on file  . Number of children: 3  . Years of education: Not on file  . Highest education level: Some college, no degree  Occupational History  .  Occupation: retired  Engineer, production  . Financial resource strain: Not hard at all  . Food insecurity:    Worry: Never true    Inability: Never true  . Transportation needs:    Medical: No    Non-medical: No  Tobacco Use  . Smoking status: Former Smoker    Packs/day: 1.00    Years: 30.00    Pack years: 30.00    Types: Cigarettes    Last attempt to quit: 08/01/2008    Years since quitting: 10.4  . Smokeless tobacco: Never Used  Substance and Sexual Activity  . Alcohol use: Yes    Alcohol/week: 1.0 - 2.0 standard drinks    Types: 1 - 2 Shots of liquor per week  . Drug use: No  . Sexual activity: Not on file  Lifestyle  . Physical activity:    Days per week: 0 days    Minutes per session: 0 min  . Stress: Not at all  Relationships  . Social connections:    Talks on phone: Patient refused    Gets together: Patient refused    Attends  religious service: Patient refused    Active member of club or organization: Patient refused    Attends meetings of clubs or organizations: Patient refused    Relationship status: Patient refused  Other Topics Concern  . Not on file  Social History Narrative  . Not on file    Outpatient Encounter Medications as of 01/20/2019  Medication Sig  . albuterol (ACCUNEB) 0.63 MG/3ML nebulizer solution Take 1 ampule by nebulization every 6 (six) hours as needed for wheezing.  Marland Kitchen albuterol (PROVENTIL HFA;VENTOLIN HFA) 108 (90 Base) MCG/ACT inhaler INHALE 2 PUFFS INTO THE LUNGS EVERY 6 HOURS AS NEEDED FOR WHEEZING ORSHORTNESS OF BREATH  . alendronate (FOSAMAX) 70 MG tablet One tab every Sunday on empty stomach with a full glass of water. Do not lie down or eat/drink anything else for the next 30 min.  Marland Kitchen aspirin 81 MG tablet Take 81 mg by mouth daily.  . betamethasone valerate (VALISONE) 0.1 % cream APPLY TOPICALLY TO AFFECTED AREA SPARINGLY 2 TIMES DAILY.  . carbidopa-levodopa (SINEMET CR) 50-200 MG tablet Take 1 tablet by mouth daily.  . carbidopa-levodopa (SINEMET IR) 25-100 MG tablet Take 2 tablets by mouth 3 (three) times daily.   . cetirizine (ZYRTEC) 10 MG tablet Take 1 tablet (10 mg total) by mouth daily. (Patient taking differently: Take 10 mg by mouth daily. As needed)  . Cholecalciferol (VITAMIN D) 2000 UNITS CAPS Take 1 capsule by mouth daily.  . citalopram (CELEXA) 20 MG tablet Take 1 tablet (20 mg total) by mouth daily.  . fluticasone (FLONASE) 50 MCG/ACT nasal spray PLACE TO SPRAYS INTO BOTHS NOSTRILS DAILY  . fluticasone furoate-vilanterol (BREO ELLIPTA) 100-25 MCG/INH AEPB USE 1 PUFF ONCE DAILY AS DIRECTED  . montelukast (SINGULAIR) 10 MG tablet TAKE 1 TABLET BY MOUTH ONCE DAILY  . nystatin (MYCOSTATIN) 100000 UNIT/ML suspension Take 5 mLs (500,000 Units total) by mouth 4 (four) times daily. Until resolution  . omeprazole (PRILOSEC) 40 MG capsule Take 40 mg by mouth daily.   . potassium  chloride SA (K-DUR,KLOR-CON) 20 MEQ tablet Take 1 tablet (20 mEq total) by mouth daily.  . simvastatin (ZOCOR) 40 MG tablet TAKE 1 TABLET BY MOUTH AT BEDTIME  . SPIRIVA RESPIMAT 2.5 MCG/ACT AERS INHALE 2 PUFFS ONCE DAILY (TO REPLACE THE HANDIHALER)  . Abaloparatide (TYMLOS) 3120 MCG/1.56ML SOPN Inject into the skin daily.   . Omega-3 Fatty  Acids (FISH OIL) 1000 MG CAPS Take 1 capsule by mouth daily.   No facility-administered encounter medications on file as of 01/20/2019.     Activities of Daily Living In your present state of health, do you have any difficulty performing the following activities: 01/20/2019 07/16/2018  Hearing? Y Y  Comment Loss of hearing in right ear. Does not wear hearing aids.  -  Vision? N N  Difficulty concentrating or making decisions? N N  Walking or climbing stairs? Y Y  Comment Due to Parkinsons. -  Dressing or bathing? N N  Doing errands, shopping? N N  Preparing Food and eating ? N -  Using the Toilet? N -  In the past six months, have you accidently leaked urine? N -  Do you have problems with loss of bowel control? N -  Managing your Medications? N -  Managing your Finances? N -  Housekeeping or managing your Housekeeping? N -  Some recent data might be hidden    Patient Care Team: Erasmo Downer, MD as PCP - General (Family Medicine) Lamar Blinks, MD as Consulting Physician (Cardiology) Wanita Chamberlain, MD as Consulting Physician (Dermatology) Yevonne Pax, MD as Consulting Physician (Internal Medicine) Antony Contras, MD as Consulting Physician (Ophthalmology) Tedd Sias, Marlana Salvage, MD as Physician Assistant (Endocrinology) Lonell Face, MD as Consulting Physician (Neurology) Maryella Shivers as Physician Assistant (Physician Assistant)    Assessment:   This is a routine wellness examination for Lori Brady.  Exercise Activities and Dietary recommendations Current Exercise Habits: Home exercise routine, Type of exercise:  stretching;walking, Time (Minutes): 30, Frequency (Times/Week): 3, Weekly Exercise (Minutes/Week): 90, Intensity: Mild, Exercise limited by: None identified  Goals    . LIFESTYLE - DECREASE FALLS RISK     Recommend to remove any items from the home that may cause slips or trips.    . Weight (lb) < 130 lb (59 kg)     Recommend eating 3 small meals with two healthy protein snacks in between.        Fall Risk Fall Risk  01/20/2019 12/08/2018 06/06/2018 01/04/2018 04/01/2017  Falls in the past year? 1 0 No Yes Yes  Comment - - - - -  Number falls in past yr: 0 0 - 1 2 or more  Comment "passed out" - - - -  Injury with Fall? 0 0 - Yes Yes  Comment - - - broken hip -  Risk Factor Category  - - - High Fall Risk High Fall Risk  Risk for fall due to : Medication side effect;Other (Comment) - - Other (Comment) -  Risk for fall due to: Comment illness - - Parkinsons -  Follow up - - - Falls prevention discussed -   FALL RISK PREVENTION PERTAINING TO THE HOME: Any stairs in or around the home? Yes  If so, do they handrails? Yes   Home free of loose throw rugs in walkways, pet beds, electrical cords, etc? Yes  Adequate lighting in your home to reduce risk of falls? Yes   ASSISTIVE DEVICES UTILIZED TO PREVENT FALLS:  Life alert? No  Use of a cane, walker or w/c? Yes  Grab bars in the bathroom? Yes  Shower chair or bench in shower? No  Elevated toilet seat or a handicapped toilet? Yes    TIMED UP AND GO:  Was the test performed? No .    Depression Screen PHQ 2/9 Scores 01/20/2019 06/06/2018 01/04/2018 04/01/2017  PHQ - 2 Score  1 0 1 0  PHQ- 9 Score - - - -     Cognitive Function- Declined today.         Immunization History  Administered Date(s) Administered  . Influenza Split 10/28/2011, 08/10/2012  . Influenza, High Dose Seasonal PF 08/21/2014, 08/29/2015  . Influenza,inj,Quad PF,6+ Mos 08/16/2013  . Influenza-Unspecified 08/16/2014, 08/11/2018  . Pneumococcal Conjugate-13  01/10/2015  . Pneumococcal Polysaccharide-23 05/29/2013  . Tdap 04/01/2017  . Zoster 08/10/2012    Qualifies for Shingles Vaccine? Yes  Zostavax completed 08/10/12. Due for Shingrix. Education has been provided regarding the importance of this vaccine. Pt has been advised to call insurance company to determine out of pocket expense. Advised may also receive vaccine at local pharmacy or Health Dept. Verbalized acceptance and understanding.  Tdap: Up to date  Flu Vaccine: Up to date  Pneumococcal Vaccine: Up to date  Screening Tests Health Maintenance  Topic Date Due  . MAMMOGRAM  06/17/2018  . COLONOSCOPY  04/21/2020  . DEXA SCAN  12/29/2020  . TETANUS/TDAP  04/02/2027  . INFLUENZA VACCINE  Completed  . Hepatitis C Screening  Completed  . PNA vac Low Risk Adult  Completed    Cancer Screenings:  Colorectal Screening: Completed 04/21/10. Repeat every 10 years.  Mammogram: Completed 06/17/16. Repeat every year; Ordered today. Pt provided with contact info and advised to call to schedule appt.   Bone Density: Completed 12/29/18. Results reflect OSTEOPOROSIS. Repeat every 2 years.   Lung Cancer Screening: (Low Dose CT Chest recommended if Age 60-80 years, 30 pack-year currently smoking OR have quit w/in 15years.) does not qualify however declines order today.    Additional Screening:  Hepatitis C Screening: Up to date  Vision Screening: Recommended annual ophthalmology exams for early detection of glaucoma and other disorders of the eye.  Dental Screening: Recommended annual dental exams for proper oral hygiene  Community Resource Referral:  CRR required this visit?  No      Plan:  I have personally reviewed and addressed the Medicare Annual Wellness questionnaire and have noted the following in the patient's chart:  A. Medical and social history B. Use of alcohol, tobacco or illicit drugs  C. Current medications and supplements D. Functional ability and status E.    Nutritional status F.  Physical activity G. Advance directives H. List of other physicians I.  Hospitalizations, surgeries, and ER visits in previous 12 months J.  Vitals K. Screenings such as hearing and vision if needed, cognitive and depression L. Referrals and appointments - none  In addition, I have reviewed and discussed with patient certain preventive protocols, quality metrics, and best practice recommendations. A written personalized care plan for preventive services as well as general preventive health recommendations were provided to patient.  See attached scanned questionnaire for additional information.   Signed,  Hyacinth Meeker, LPN Nurse Health Advisor   Nurse Recommendations: None.

## 2019-01-20 NOTE — Progress Notes (Signed)
Patient: Lori Brady, Female    DOB: 03-24-1946, 73 y.o.   MRN: [id number] Visit Date: 01/20/2019  Today's Provider: Trinna Post, PA-C   Chief Complaint  Patient presents with  . Annual Exam   Subjective:     Complete Physical [name] is a 73 y.o. female. She feels well. She reports exercising daily. She reports she is sleeping well.  Mammogram: 06/2016 normal. Mammogram ordered PAP: not indicated.  DEXA: 12/29/2018 osteoporosis, treated by endocrinology Colonoscopy 2011: Repeat every 10 years.  -----------------------------------------------------------   Review of Systems  Constitutional: Negative for chills, fatigue and fever.  HENT: Negative for congestion, ear pain, rhinorrhea, sneezing and sore throat.   Eyes: Negative.  Negative for pain and redness.  Respiratory: Negative for cough, shortness of breath and wheezing.   Cardiovascular: Negative for chest pain and leg swelling.  Gastrointestinal: Negative for abdominal pain, blood in stool, constipation, diarrhea and nausea.  Endocrine: Negative for polydipsia and polyphagia.  Genitourinary: Negative.  Negative for dysuria, flank pain, hematuria, pelvic pain, vaginal bleeding and vaginal discharge.  Musculoskeletal: Negative for arthralgias, back pain, gait problem and joint swelling.  Skin: Negative for rash.  Neurological: Negative.  Negative for dizziness, tremors, seizures, weakness, light-headedness, numbness and headaches.  Hematological: Negative for adenopathy.  Psychiatric/Behavioral: Negative.  Negative for behavioral problems, confusion and dysphoric mood. The patient is not nervous/anxious and is not hyperactive.     Social History   Socioeconomic History  . Marital status: Widowed    Spouse name: Not on file  . Number of children: 3  . Years of education: Not on file  . Highest education level: Some college, no degree  Occupational History  . Occupation: retired  Scientific laboratory technician  .  Financial resource strain: Not hard at all  . Food insecurity:    Worry: Never true    Inability: Never true  . Transportation needs:    Medical: No    Non-medical: No  Tobacco Use  . Smoking status: Former Smoker    Packs/day: 1.00    Years: 30.00    Pack years: 30.00    Types: Cigarettes    Last attempt to quit: 08/01/2008    Years since quitting: 10.4  . Smokeless tobacco: Never Used  Substance and Sexual Activity  . Alcohol use: Yes    Alcohol/week: 1.0 - 2.0 standard drinks    Types: 1 - 2 Shots of liquor per week  . Drug use: No  . Sexual activity: Not on file  Lifestyle  . Physical activity:    Days per week: 0 days    Minutes per session: 0 min  . Stress: Not at all  Relationships  . Social connections:    Talks on phone: Patient refused    Gets together: Patient refused    Attends religious service: Patient refused    Active member of club or organization: Patient refused    Attends meetings of clubs or organizations: Patient refused    Relationship status: Patient refused  . Intimate partner violence:    Fear of current or ex partner: Patient refused    Emotionally abused: Patient refused    Physically abused: Patient refused    Forced sexual activity: Patient refused  Other Topics Concern  . Not on file  Social History Narrative  . Not on file    Past Medical History:  Diagnosis Date  . Arthritis   . Asthma   . Crohn's disease ([place])   .  Emphysema of lung ([place])   . GERD (gastroesophageal reflux disease)   . Hip fracture ([place]) 01/26/2017  . History of chicken pox   . History of heart attack   . Hx of completed stroke   . Hypercholesteremia   . Hypertension   . Parkinson disease ([place])   . Pneumonia   . Seasonal allergies   . Sepsis ([place])   . Septic shock ([place]) 10/08/2016  . UTI (lower urinary tract infection)      Patient Active Problem List   Diagnosis Date Noted  . Hypokalemia 07/22/2018  . Oral thrush 07/22/2018  . Gastroenteritis  07/16/2018  . Stroke ([place]) 03/07/2018  . Primary Parkinsonism ([place]) 01/05/2018  . SIADH (syndrome of inappropriate ADH production) ([place]) 10/01/2017  . Spinal stenosis 10/01/2017  . Elevated troponin I level 11/18/2016  . COPD (chronic obstructive pulmonary disease) with chronic bronchitis ([place]) 08/12/2016  . Asthma without status asthmaticus 03/31/2016  . Osteoporosis 03/31/2016  . Fatty liver determined by biopsy 09/17/2015  . Parotid swelling 09/13/2015  . Urticaria 09/13/2015  . Cerebrovascular accident (CVA) ([place]) 08/21/2015  . Senile purpura ([place]) 08/21/2015  . Atrial tachycardia ([place]) 08/13/2015  . Eczema 07/10/2015  . Allergic rhinitis 06/26/2015  . Anxiety 04/23/2015  . Edema leg 03/27/2015  . Generalized weakness 03/06/2015  . Primary cardiomyopathy ([place]) 03/05/2015  . CAD (coronary artery disease) 03/05/2015  . Benign essential HTN 02/28/2015  . Combined fat and carbohydrate induced hyperlipemia 09/06/2014  . Acid reflux 08/01/2014    Past Surgical History:  Procedure Laterality Date  . BREAST BIOPSY Bilateral 1960's to 90's  . breast cysts    . CATARACT EXTRACTION, BILATERAL    . right wrist fracture    . TUBAL LIGATION      Her family history includes Breast cancer (age of onset: 68) in her sister; Breast cancer (age of onset: 65) in her sister; Cancer in her brother, father, sister, and sister; Diabetes in her maternal grandmother; Heart attack in her brother; Heart disease in her brother, father, sister, and sister; Pancreatic cancer in her mother.   Current Outpatient Medications:  .  Abaloparatide (TYMLOS) 3120 MCG/1.56ML SOPN, Inject into the skin daily. , Disp: , Rfl:  .  albuterol (ACCUNEB) 0.63 MG/3ML nebulizer solution, Take 1 ampule by nebulization every 6 (six) hours as needed for wheezing., Disp: , Rfl:  .  albuterol (PROVENTIL HFA;VENTOLIN HFA) 108 (90 Base) MCG/ACT inhaler, INHALE 2 PUFFS INTO THE LUNGS EVERY 6 HOURS AS NEEDED FOR WHEEZING  ORSHORTNESS OF BREATH, Disp: 8.5 g, Rfl: 2 .  alendronate (FOSAMAX) 70 MG tablet, One tab every Sunday on empty stomach with a full glass of water. Do not lie down or eat/drink anything else for the next 30 min., Disp: , Rfl:  .  aspirin 81 MG tablet, Take 81 mg by mouth daily., Disp: , Rfl:  .  betamethasone valerate (VALISONE) 0.1 % cream, APPLY TOPICALLY TO AFFECTED AREA SPARINGLY 2 TIMES DAILY., Disp: 15 g, Rfl: 5 .  carbidopa-levodopa (SINEMET CR) 50-200 MG tablet, Take 1 tablet by mouth daily., Disp: , Rfl:  .  carbidopa-levodopa (SINEMET IR) 25-100 MG tablet, Take 2 tablets by mouth 3 (three) times daily. , Disp: , Rfl:  .  cetirizine (ZYRTEC) 10 MG tablet, Take 1 tablet (10 mg total) by mouth daily. (Patient taking differently: Take 10 mg by mouth daily. As needed), Disp: 30 tablet, Rfl: 11 .  Cholecalciferol (VITAMIN D) 2000 UNITS CAPS, Take 1 capsule by mouth daily., Disp: ,  Rfl:  .  citalopram (CELEXA) 20 MG tablet, Take 1 tablet (20 mg total) by mouth daily., Disp: 90 tablet, Rfl: 3 .  fluticasone (FLONASE) 50 MCG/ACT nasal spray, PLACE TO SPRAYS INTO BOTHS NOSTRILS DAILY, Disp: 16 g, Rfl: 6 .  fluticasone furoate-vilanterol (BREO ELLIPTA) 100-25 MCG/INH AEPB, USE 1 PUFF ONCE DAILY AS DIRECTED, Disp: 180 each, Rfl: 1 .  montelukast (SINGULAIR) 10 MG tablet, TAKE 1 TABLET BY MOUTH ONCE DAILY, Disp: 90 tablet, Rfl: 1 .  nystatin (MYCOSTATIN) 100000 UNIT/ML suspension, Take 5 mLs (500,000 Units total) by mouth 4 (four) times daily. Until resolution, Disp: 300 mL, Rfl: 0 .  Omega-3 Fatty Acids (FISH OIL) 1000 MG CAPS, Take 1 capsule by mouth daily., Disp: , Rfl:  .  omeprazole (PRILOSEC) 40 MG capsule, Take 40 mg by mouth daily. , Disp: , Rfl:  .  potassium chloride SA (K-DUR,KLOR-CON) 20 MEQ tablet, Take 1 tablet (20 mEq total) by mouth daily., Disp: 60 tablet, Rfl: 5 .  simvastatin (ZOCOR) 40 MG tablet, TAKE 1 TABLET BY MOUTH AT BEDTIME, Disp: 90 tablet, Rfl: 3 .  SPIRIVA RESPIMAT 2.5  MCG/ACT AERS, INHALE 2 PUFFS ONCE DAILY (TO REPLACE THE HANDIHALER), Disp: 12 g, Rfl: 3  Patient Care Team: Bacigalupo, Dionne Bucy, MD as PCP - General (Family Medicine) Corey Skains, MD as Consulting Physician (Cardiology) Jannet Mantis, MD as Consulting Physician (Dermatology) Allyne Gee, MD as Consulting Physician (Internal Medicine) Katy Apo, MD as Consulting Physician (Ophthalmology) Gabriel Carina Betsey Holiday, MD as Physician Assistant (Endocrinology) Vladimir Crofts, MD as Consulting Physician (Neurology) Trinna Post, PA-C as Physician Assistant (Physician Assistant)     Objective:    Vitals: BP (!) 156/72 (BP Location: Right Arm, Patient Position: Sitting, Cuff Size: Normal)   BP 164/76 (BP Location: Right Arm)   Pulse 98   Temp 98.6 F (37 C) (Oral)   Ht 5' 1"  (1.549 m)   Wt 144 lb 12.8 oz (65.7 kg)   BMI 27.36 kg/m   BSA 1.68 m   Pain [place] 0-No pain    Physical Exam Constitutional:      Appearance: Normal appearance.  Cardiovascular:     Rate and Rhythm: Normal rate and regular rhythm.     Heart sounds: Normal heart sounds.  Pulmonary:     Effort: Pulmonary effort is normal.     Breath sounds: Normal breath sounds.  Skin:    General: Skin is warm and dry.  Neurological:     Mental Status: She is alert and oriented to person, place, and time. Mental status is at baseline.  Psychiatric:        Mood and Affect: Mood normal.        Behavior: Behavior normal.     Activities of Daily Living In your present state of health, do you have any difficulty performing the following activities: 01/20/2019 07/16/2018  Hearing? Y Y  Comment Loss of hearing in right ear. Does not wear hearing aids.  -  Vision? N N  Difficulty concentrating or making decisions? N N  Walking or climbing stairs? Y Y  Comment Due to Parkinsons. -  Dressing or bathing? N N  Doing errands, shopping? N N  Preparing Food and eating ? N -  Using the Toilet? N -  In the past six  months, have you accidently leaked urine? N -  Do you have problems with loss of bowel control? N -  Managing your Medications? N -  Managing  your Finances? N -  Housekeeping or managing your Housekeeping? N -  Some recent data might be hidden    Fall Risk Assessment Fall Risk  01/20/2019 12/08/2018 06/06/2018 01/04/2018 04/01/2017  Falls in the past year? 1 0 No Yes Yes  Comment - - - - -  Number falls in past yr: 0 0 - 1 2 or more  Comment "passed out" - - - -  Injury with Fall? 0 0 - Yes Yes  Comment - - - broken hip -  Risk Factor Category  - - - High Fall Risk High Fall Risk  Risk for fall due to : Medication side effect;Other (Comment) - - Other (Comment) -  Risk for fall due to: Comment illness - - Parkinsons -  Follow up - - - Falls prevention discussed -     Depression Screen PHQ 2/9 Scores 01/20/2019 06/06/2018 01/04/2018 04/01/2017  PHQ - 2 Score 1 0 1 0  PHQ- 9 Score - - - -    No flowsheet data found.     Assessment & Plan:    Annual Physical Reviewed patient's Family Medical History Reviewed and updated list of patient's medical providers Assessment of cognitive impairment was done Assessed patient's functional ability Established a written schedule for health screening [place] Completed and Reviewed  Exercise Activities and Dietary recommendations Goals    . LIFESTYLE - DECREASE FALLS RISK     Recommend to remove any items from the home that may cause slips or trips.    . Weight (lb) < 130 lb (59 kg)     Recommend eating 3 small meals with two healthy protein snacks in between.        Immunization History  Administered Date(s) Administered  . Influenza Split 10/28/2011, 08/10/2012  . Influenza, High Dose Seasonal PF 08/21/2014, 08/29/2015  . Influenza,inj,Quad PF,6+ Mos 08/16/2013  . Influenza-Unspecified 08/16/2014, 08/11/2018  . Pneumococcal Conjugate-13 01/10/2015  . Pneumococcal Polysaccharide-23 05/29/2013  . Tdap 04/01/2017   . Zoster 08/10/2012    Health Maintenance  Topic Date Due  . MAMMOGRAM  06/17/2018  . COLONOSCOPY  04/21/2020  . DEXA SCAN  12/29/2020  . TETANUS/TDAP  04/02/2027  . INFLUENZA VACCINE  Completed  . Hepatitis C Screening  Completed  . PNA vac Low Risk Adult  Completed     Discussed health benefits of physical activity, and encouraged her to engage in regular exercise appropriate for her age and condition.    1. Annual physical exam  UTD on maintenance.   2. Essential hypertension   3. Breast cancer screening  The entirety of the information documented in the History of Present Illness, Review of Systems and Physical Exam were personally obtained by me. Portions of this information were initially documented by Lyndel Pleasure, CMA and reviewed by me for thoroughness and accuracy.       ------------------------------------------------------------------------------------------------------------    Trinna Post, PA-C  [facility]

## 2019-01-20 NOTE — Patient Instructions (Signed)

## 2019-01-20 NOTE — Patient Instructions (Addendum)
Lori Brady , Thank you for taking time to come for your Medicare Wellness Visit. I appreciate your ongoing commitment to your health goals. Please review the following plan we discussed and let me know if I can assist you in the future.   Screening recommendations/referrals: Colonoscopy: Up to date, due 04/2020 Mammogram: Ordered today. Pt provided with contact info and advised to call to schedule appt.  Bone Density: Up to date, due 12/2020 Recommended yearly ophthalmology/optometry visit for glaucoma screening and checkup Recommended yearly dental visit for hygiene and checkup  Vaccinations: Influenza vaccine: Up to date Pneumococcal vaccine: Completed series Tdap vaccine: Up to date, due 03/2027 Shingles vaccine: Pt declines today.     Advanced directives: Advance directive discussed with you today. Even though you declined this today please call our office should you change your mind and we can give you the proper paperwork for you to fill out.  Conditions/risks identified: Fall risk prevention; Continue to stick to diet plan to help aid in weight loss.   Next appointment: 3:40 PM today with Lori Brady.   Preventive Care 32 Years and Older, Female Preventive care refers to lifestyle choices and visits with your health care provider that can promote health and wellness. What does preventive care include?  A yearly physical exam. This is also called an annual well check.  Dental exams once or twice a year.  Routine eye exams. Ask your health care provider how often you should have your eyes checked.  Personal lifestyle choices, including:  Daily care of your teeth and gums.  Regular physical activity.  Eating a healthy diet.  Avoiding tobacco and drug use.  Limiting alcohol use.  Practicing safe sex.  Taking low-dose aspirin every day.  Taking vitamin and mineral supplements as recommended by your health care provider. What happens during an annual well  check? The services and screenings done by your health care provider during your annual well check will depend on your age, overall health, lifestyle risk factors, and family history of disease. Counseling  Your health care provider may ask you questions about your:  Alcohol use.  Tobacco use.  Drug use.  Emotional well-being.  Home and relationship well-being.  Sexual activity.  Eating habits.  History of falls.  Memory and ability to understand (cognition).  Work and work Astronomer.  Reproductive health. Screening  You may have the following tests or measurements:  Height, weight, and BMI.  Blood pressure.  Lipid and cholesterol levels. These may be checked every 5 years, or more frequently if you are over 52 years old.  Skin check.  Lung cancer screening. You may have this screening every year starting at age 6 if you have a 30-pack-year history of smoking and currently smoke or have quit within the past 15 years.  Fecal occult blood test (FOBT) of the stool. You may have this test every year starting at age 54.  Flexible sigmoidoscopy or colonoscopy. You may have a sigmoidoscopy every 5 years or a colonoscopy every 10 years starting at age 69.  Hepatitis C blood test.  Hepatitis B blood test.  Sexually transmitted disease (STD) testing.  Diabetes screening. This is done by checking your blood sugar (glucose) after you have not eaten for a while (fasting). You may have this done every 1-3 years.  Bone density scan. This is done to screen for osteoporosis. You may have this done starting at age 25.  Mammogram. This may be done every 1-2 years. Talk to your health  care provider about how often you should have regular mammograms. Talk with your health care provider about your test results, treatment options, and if necessary, the need for more tests. Vaccines  Your health care provider may recommend certain vaccines, such as:  Influenza vaccine. This is  recommended every year.  Tetanus, diphtheria, and acellular pertussis (Tdap, Td) vaccine. You may need a Td booster every 10 years.  Zoster vaccine. You may need this after age 22.  Pneumococcal 13-valent conjugate (PCV13) vaccine. One dose is recommended after age 10.  Pneumococcal polysaccharide (PPSV23) vaccine. One dose is recommended after age 13. Talk to your health care provider about which screenings and vaccines you need and how often you need them. This information is not intended to replace advice given to you by your health care provider. Make sure you discuss any questions you have with your health care provider. Document Released: 11/29/2015 Document Revised: 07/22/2016 Document Reviewed: 09/03/2015 Elsevier Interactive Patient Education  2017 [place] Prevention in the Home Falls can cause injuries. They can happen to people of all ages. There are many things you can do to make your home safe and to help prevent falls. What can I do on the outside of my home?  Regularly fix the edges of walkways and driveways and fix any cracks.  Remove anything that might make you trip as you walk through a door, such as a raised step or threshold.  Trim any bushes or trees on the path to your home.  Use bright outdoor lighting.  Clear any walking paths of anything that might make someone trip, such as rocks or tools.  Regularly check to see if handrails are loose or broken. Make sure that both sides of any steps have handrails.  Any raised decks and porches should have guardrails on the edges.  Have any leaves, snow, or ice cleared regularly.  Use sand or salt on walking paths during winter.  Clean up any spills in your garage right away. This includes oil or grease spills. What can I do in the bathroom?  Use night lights.  Install grab bars by the toilet and in the tub and shower. Do not use towel bars as grab bars.  Use non-skid mats or decals in the tub or  shower.  If you need to sit down in the shower, use a plastic, non-slip stool.  Keep the floor dry. Clean up any water that spills on the floor as soon as it happens.  Remove soap buildup in the tub or shower regularly.  Attach bath mats securely with double-sided non-slip rug tape.  Do not have throw rugs and other things on the floor that can make you trip. What can I do in the bedroom?  Use night lights.  Make sure that you have a light by your bed that is easy to reach.  Do not use any sheets or blankets that are too big for your bed. They should not hang down onto the floor.  Have a firm chair that has side arms. You can use this for support while you get dressed.  Do not have throw rugs and other things on the floor that can make you trip. What can I do in the kitchen?  Clean up any spills right away.  Avoid walking on wet floors.  Keep items that you use a lot in easy-to-reach places.  If you need to reach something above you, use a strong step stool that has a grab  bar.  Keep electrical cords out of the way.  Do not use floor polish or wax that makes floors slippery. If you must use wax, use non-skid floor wax.  Do not have throw rugs and other things on the floor that can make you trip. What can I do with my stairs?  Do not leave any items on the stairs.  Make sure that there are handrails on both sides of the stairs and use them. Fix handrails that are broken or loose. Make sure that handrails are as long as the stairways.  Check any carpeting to make sure that it is firmly attached to the stairs. Fix any carpet that is loose or worn.  Avoid having throw rugs at the top or bottom of the stairs. If you do have throw rugs, attach them to the floor with carpet tape.  Make sure that you have a light switch at the top of the stairs and the bottom of the stairs. If you do not have them, ask someone to add them for you. What else can I do to help prevent  falls?  Wear shoes that:  Do not have high heels.  Have rubber bottoms.  Are comfortable and fit you well.  Are closed at the toe. Do not wear sandals.  If you use a stepladder:  Make sure that it is fully opened. Do not climb a closed stepladder.  Make sure that both sides of the stepladder are locked into place.  Ask someone to hold it for you, if possible.  Clearly mark and make sure that you can see:  Any grab bars or handrails.  First and last steps.  Where the edge of each step is.  Use tools that help you move around (mobility aids) if they are needed. These include:  Canes.  Walkers.  Scooters.  Crutches.  Turn on the lights when you go into a dark area. Replace any light bulbs as soon as they burn out.  Set up your furniture so you have a clear path. Avoid moving your furniture around.  If any of your floors are uneven, fix them.  If there are any pets around you, be aware of where they are.  Review your medicines with your doctor. Some medicines can make you feel dizzy. This can increase your chance of falling. Ask your doctor what other things that you can do to help prevent falls. This information is not intended to replace advice given to you by your health care provider. Make sure you discuss any questions you have with your health care provider. Document Released: 08/29/2009 Document Revised: 04/09/2016 Document Reviewed: 12/07/2014 Elsevier Interactive Patient Education  2017 [employer].

## 2019-01-25 ENCOUNTER — Ambulatory Visit: Payer: Medicare Other | Admitting: Internal Medicine

## 2019-01-25 DIAGNOSIS — R0602 Shortness of breath: Secondary | ICD-10-CM | POA: Diagnosis not present

## 2019-01-25 LAB — PULMONARY FUNCTION TEST

## 2019-01-26 ENCOUNTER — Other Ambulatory Visit: Payer: Self-pay | Admitting: Family Medicine

## 2019-01-30 NOTE — Procedures (Signed)
[facility] [place], [id number]  DATE OF SERVICE: January 25, 2019  Complete Pulmonary Function Testing Interpretation:  FINDINGS:  The forced vital capacity is mildly decreased.  The FEV1 is 1.4 L which is 71% of predicted and is mildly decreased.  FEV1 FVC ratio is mildly decreased.  Postbronchodilator there is no significant change in the FEV1.  Total lung capacity is normal residual volume is normal residual volume total capacity ratio is increased total gas volume is.  DLCO is mildly decreased and is normal when corrected for alveolar volume.  IMPRESSION:  This pulmonary function study is consistent with mild obstructive lung disease.  There is no improvement postbronchodilator clinical improvement may occur in the absence of spirometric improvement clinical correlation is recommended.  Allyne Gee, MD [facility]monary Critical Care Medicine Sleep Medicine

## 2019-02-16 ENCOUNTER — Other Ambulatory Visit: Payer: Self-pay | Admitting: Physician Assistant

## 2019-02-16 DIAGNOSIS — E782 Mixed hyperlipidemia: Secondary | ICD-10-CM

## 2019-03-17 ENCOUNTER — Other Ambulatory Visit: Payer: Self-pay | Admitting: Adult Health

## 2019-03-28 ENCOUNTER — Encounter: Payer: Self-pay | Admitting: Physician Assistant

## 2019-03-28 ENCOUNTER — Ambulatory Visit (INDEPENDENT_AMBULATORY_CARE_PROVIDER_SITE_OTHER): Payer: Medicare Other | Admitting: Physician Assistant

## 2019-03-28 VITALS — BP 98/77 | HR 112 | Temp 99.5°F | Wt 135.0 lb

## 2019-03-28 DIAGNOSIS — K529 Noninfective gastroenteritis and colitis, unspecified: Secondary | ICD-10-CM | POA: Diagnosis not present

## 2019-03-28 MED ORDER — ONDANSETRON HCL 4 MG PO TABS: 4.0000 mg | ORAL_TABLET | Freq: Three times a day (TID) | ORAL | 1 refills | Status: DC | PRN

## 2019-03-28 NOTE — Patient Instructions (Signed)
Viral Gastroenteritis, Adult  Viral gastroenteritis is also known as the stomach flu. This condition is caused by certain germs (viruses). These germs can be passed from person to person very easily (are very contagious). This condition can cause sudden watery poop (diarrhea), fever, and throwing up (vomiting). Having watery poop and throwing up can make you feel weak and cause you to get dehydrated. Dehydration can make you tired and thirsty, make you have a dry mouth, and make it so you pee (urinate) less often. Older adults and people with other diseases or a weak defense system (immune system) are at higher risk for dehydration. It is important to replace the fluids that you lose from having watery poop and throwing up. Follow these instructions at home: Follow instructions from your doctor about how to care for yourself at home. Eating and drinking Follow these instructions as told by your doctor:  Take an oral rehydration solution (ORS). This is a drink that is sold at pharmacies and stores.  Drink clear fluids in small amounts as you are able, such as: ? Water. ? Ice chips. ? Diluted fruit juice. ? Low-calorie sports drinks.  Eat bland, easy-to-digest foods in small amounts as you are able, such as: ? Bananas. ? Applesauce. ? Rice. ? Low-fat (lean) meats. ? Toast. ? Crackers.  Avoid fluids that have a lot of sugar or caffeine in them.  Avoid alcohol.  Avoid spicy or fatty foods. General instructions   Drink enough fluid to keep your pee (urine) clear or pale yellow.  Wash your hands often. If you cannot use soap and water, use hand sanitizer.  Make sure that all people in your home wash their hands well and often.  Rest at home while you get better.  Take over-the-counter and prescription medicines only as told by your doctor.  Watch your condition for any changes.  Take a warm bath to help with any burning or pain from having watery poop.  Keep all follow-up  visits as told by your doctor. This is important. Contact a doctor if:  You cannot keep fluids down.  Your symptoms get worse.  You have new symptoms.  You feel light-headed or dizzy.  You have muscle cramps. Get help right away if:  You have chest pain.  You feel very weak or you pass out (faint).  You see blood in your throw-up.  Your throw-up looks like coffee grounds.  You have bloody or black poop (stools) or poop that look like tar.  You have a very bad headache, a stiff neck, or both.  You have a rash.  You have very bad pain, cramping, or bloating in your belly (abdomen).  You have trouble breathing.  You are breathing very quickly.  Your heart is beating very quickly.  Your skin feels cold and clammy.  You feel confused.  You have pain when you pee.  You have signs of dehydration, such as: ? Dark pee, hardly any pee, or no pee. ? Cracked lips. ? Dry mouth. ? Sunken eyes. ? Sleepiness. ? Weakness. This information is not intended to replace advice given to you by your health care provider. Make sure you discuss any questions you have with your health care provider. Document Released: 04/20/2008 Document Revised: 07/27/2018 Document Reviewed: 07/09/2015 Elsevier Interactive Patient Education  2019 [employer].

## 2019-03-28 NOTE — Progress Notes (Signed)
Patient: Lori Brady Female    DOB: 1946/10/22   73 y.o.   MRN: [id number] Visit Date: 03/28/2019  Today's Provider: Trinna Post, PA-C   Chief Complaint  Patient presents with  . Diarrhea  . Fever   Subjective:     HPI  Virtual Visit via Video Note  I connected with Lori Brady on 03/28/19 at 11:00 AM EDT by a video enabled telemedicine application and verified that I am speaking with the correct person using two identifiers.  Location: Patient: Home Provider: Office    I discussed the limitations of evaluation and management by telemedicine and the availability of in person appointments. The patient expressed understanding and agreed to proceed.  Reports last Thursday she woke up and reports her abdomen was sore and she had a lot of gas. Reports she was nauseated and did vomit three times stomach contents. Did not have diarrhea on Thursday. Was not able to eat on Thursday, reported able to drink fluids.   Friday reports she had diarrhea all day, reports running temperature on Friday. Reports loose stools but not completely liquid. Denies antibiotics in the past three months.   Saturday did not vomit or have diarrhea on Saturday. Was drinking fluids. Still feverish. Felt better during the day, more belly pain Her sister was sick last week with similar symptoms, diarrhea and vomiting for one day.  Currently not short of breath, no chest pain. Feels a little weak Entire belly hurts, not one area in particular. Still peeing.  BP is 98/77, temperature 99.24FTemperature 100.24F, took tylenol yesterday.  Has not eaten since Wednesday.  Allergies  Allergen Reactions  . Levaquin [Levofloxacin] Nausea Only  . Vicodin [Hydrocodone-Acetaminophen] Nausea Only     Current Outpatient Medications:  .  Abaloparatide (TYMLOS) 3120 MCG/1.56ML SOPN, Inject into the skin daily. , Disp: , Rfl:  .  albuterol (ACCUNEB) 0.63 MG/3ML nebulizer solution, Take 1 ampule by  nebulization every 6 (six) hours as needed for wheezing., Disp: , Rfl:  .  albuterol (PROVENTIL HFA;VENTOLIN HFA) 108 (90 Base) MCG/ACT inhaler, INHALE 2 PUFFS INTO THE LUNGS EVERY 6 HOURS AS NEEDED FOR WHEEZING ORSHORTNESS OF BREATH, Disp: 8.5 g, Rfl: 2 .  alendronate (FOSAMAX) 70 MG tablet, One tab every Sunday on empty stomach with a full glass of water. Do not lie down or eat/drink anything else for the next 30 min., Disp: , Rfl:  .  aspirin 81 MG tablet, Take 81 mg by mouth daily., Disp: , Rfl:  .  betamethasone valerate (VALISONE) 0.1 % cream, APPLY TOPICALLY TO AFFECTED AREA SPARINGLY 2 TIMES DAILY., Disp: 15 g, Rfl: 5 .  BREO ELLIPTA 100-25 MCG/INH AEPB, INHALE 1 PUFF BY MOUTH ONCE DAILY AS DIRECTED, Disp: 180 each, Rfl: 1 .  carbidopa-levodopa (SINEMET CR) 50-200 MG tablet, Take 1 tablet by mouth daily., Disp: , Rfl:  .  carbidopa-levodopa (SINEMET IR) 25-100 MG tablet, Take 2 tablets by mouth 3 (three) times daily. , Disp: , Rfl:  .  cetirizine (ZYRTEC) 10 MG tablet, Take 1 tablet (10 mg total) by mouth daily. (Patient taking differently: Take 10 mg by mouth daily. As needed), Disp: 30 tablet, Rfl: 11 .  Cholecalciferol (VITAMIN D) 2000 UNITS CAPS, Take 1 capsule by mouth daily., Disp: , Rfl:  .  citalopram (CELEXA) 20 MG tablet, Take 1 tablet (20 mg total) by mouth daily., Disp: 90 tablet, Rfl: 3 .  fluticasone (FLONASE) 50 MCG/ACT nasal spray, PLACE TO SPRAYS  INTO BOTHS NOSTRILS DAILY, Disp: 16 g, Rfl: 6 .  montelukast (SINGULAIR) 10 MG tablet, TAKE 1 TABLET BY MOUTH ONCE DAILY, Disp: 90 tablet, Rfl: 1 .  omeprazole (PRILOSEC) 40 MG capsule, Take 40 mg by mouth daily. , Disp: , Rfl:  .  potassium chloride SA (K-DUR,KLOR-CON) 20 MEQ tablet, Take 1 tablet (20 mEq total) by mouth daily., Disp: 60 tablet, Rfl: 5 .  simvastatin (ZOCOR) 40 MG tablet, TAKE 1 TABLET BY MOUTH AT BEDTIME, Disp: 90 tablet, Rfl: 1 .  SPIRIVA RESPIMAT 2.5 MCG/ACT AERS, INHALE 2 PUFFS ONCE DAILY (TO REPLACE THE  HANDIHALER), Disp: 12 g, Rfl: 3 .  nystatin (MYCOSTATIN) 100000 UNIT/ML suspension, Take 5 mLs (500,000 Units total) by mouth 4 (four) times daily. Until resolution (Patient not taking: Reported on 03/28/2019), Disp: 300 mL, Rfl: 0  Review of Systems  Constitutional: Positive for fever.  HENT: Positive for congestion, sinus pressure and sinus pain.   Respiratory: Positive for cough. Negative for chest tightness, shortness of breath and wheezing.   Gastrointestinal: Positive for diarrhea.    Social History   Tobacco Use  . Smoking status: Former Smoker    Packs/day: 1.00    Years: 30.00    Pack years: 30.00    Types: Cigarettes    Last attempt to quit: 08/01/2008    Years since quitting: 10.6  . Smokeless tobacco: Never Used  Substance Use Topics  . Alcohol use: Yes    Alcohol/week: 1.0 - 2.0 standard drinks    Types: 1 - 2 Shots of liquor per week      Objective:   BP 98/77 (BP Location: Left Arm, Patient Position: Sitting, Cuff Size: Large)   Pulse (!) 112   Temp 99.5 F (37.5 C) (Oral)   Wt 135 lb (61.2 kg)   BMI 25.51 kg/m  Vitals:   03/28/19 1035  BP: 98/77  Pulse: (!) 112  Temp: 99.5 F (37.5 C)  TempSrc: Oral  Weight: 135 lb (61.2 kg)     Physical Exam Constitutional:      Appearance: Normal appearance.  Skin:    General: Skin is warm and dry.  Neurological:     Mental Status: She is alert and oriented to person, place, and time. Mental status is at baseline.  Psychiatric:        Mood and Affect: Mood normal.        Behavior: Behavior normal.         Assessment & Plan    1. Gastroenteritis  I think patient likely has viral gastroenteritis especially since her sister had similar symptoms. She does however still have a temperature and is likely very dehydrated. I have recommended that she could be seen in the ER and receive some fluids but patient really does not want to do this. I will give her nausea medications with strict instructions to be seen  emergently if she continues with vomiting, fever, and weakness.   - ondansetron (ZOFRAN) 4 MG tablet; Take 1 tablet (4 mg total) by mouth every 8 (eight) hours as needed for nausea or vomiting.  Dispense: 20 tablet; Refill: 1  The entirety of the information documented in the History of Present Illness, Review of Systems and Physical Exam were personally obtained by me. Portions of this information were initially documented by Lyndel Pleasure, CMA and reviewed by me for thoroughness and accuracy.   F/u PRN       Trinna Post, PA-C  [facility]

## 2019-06-01 ENCOUNTER — Ambulatory Visit (INDEPENDENT_AMBULATORY_CARE_PROVIDER_SITE_OTHER): Payer: Medicare Other | Admitting: Family Medicine

## 2019-06-01 ENCOUNTER — Other Ambulatory Visit: Payer: Self-pay

## 2019-06-01 VITALS — BP 167/79 | HR 97 | Temp 98.5°F | Wt 142.0 lb

## 2019-06-01 DIAGNOSIS — D692 Other nonthrombocytopenic purpura: Secondary | ICD-10-CM

## 2019-06-01 DIAGNOSIS — G20 Parkinson's disease: Secondary | ICD-10-CM

## 2019-06-01 DIAGNOSIS — I10 Essential (primary) hypertension: Secondary | ICD-10-CM | POA: Diagnosis not present

## 2019-06-01 DIAGNOSIS — H9192 Unspecified hearing loss, left ear: Secondary | ICD-10-CM

## 2019-06-01 DIAGNOSIS — E782 Mixed hyperlipidemia: Secondary | ICD-10-CM

## 2019-06-01 DIAGNOSIS — R42 Dizziness and giddiness: Secondary | ICD-10-CM | POA: Insufficient documentation

## 2019-06-01 DIAGNOSIS — W19XXXA Unspecified fall, initial encounter: Secondary | ICD-10-CM | POA: Diagnosis not present

## 2019-06-01 DIAGNOSIS — H9190 Unspecified hearing loss, unspecified ear: Secondary | ICD-10-CM | POA: Insufficient documentation

## 2019-06-01 DIAGNOSIS — Z8673 Personal history of transient ischemic attack (TIA), and cerebral infarction without residual deficits: Secondary | ICD-10-CM

## 2019-06-01 NOTE — Assessment & Plan Note (Signed)
Previously well controlled, but elevated today She was just started on lisinopril 10 mg daily by cardiology less than 1 month ago She attributes elevated blood pressure today to recent fall and feeling anxious and overwhelmed Advised monitoring home blood pressure and to let me know if this remains elevated and we will does titrate

## 2019-06-01 NOTE — Assessment & Plan Note (Signed)
Followed by neurology Shuffling gait and cogwheel rigidity at baseline today Likely contributes to her fall risk

## 2019-06-01 NOTE — Assessment & Plan Note (Signed)
Continue simvastatin Recheck lipid panel and CMP 

## 2019-06-01 NOTE — Assessment & Plan Note (Signed)
Chronic and stable.   

## 2019-06-01 NOTE — Progress Notes (Signed)
Patient: Lori Brady Female    DOB: [birth year]   73 y.o.   MRN: [id number] Visit Date: 06/01/2019  Today's Provider: Shirlee Latch, MD   Chief Complaint  Patient presents with  . Hypertension  . Fall    Pt fell last night.  . Hearing Loss   Subjective:    Fall Incident onset: Pt reports fallling last night after getting up to go to the bathroom. The point of impact was the left elbow and right elbow. The pain is present in the right lower arm and left elbow. Associated symptoms include hearing loss. Pertinent negatives include no abdominal pain, headaches, loss of consciousness, numbness or tingling.  Patient denies any syncope or presyncope or prodrome.  She feels as though she was having some vertigo and lost her balance and fell down.  She denies any residual bony pain.  She does have a skin tear on her left elbow.  She does have some bruising on bilateral forearms, which is normal for the patient.   Hypertension, follow-up:  BP Readings from Last 3 Encounters:  06/01/19 (!) 167/79  03/28/19 98/77  01/20/19 (!) 156/72    She was last seen for hypertension 4 months ago.  BP at that visit was 156/72. Management changes since that visit include Pt states her cardiologist put her back on Lisinopril 10mg  a day. She reports excellent compliance with treatment. She is not having side effects.  She is exercising. She is adherent to low salt diet.   Outside blood pressures are 120's/70's. She is experiencing none.  Patient denies chest pain, dyspnea, fatigue, lower extremity edema and syncope.   Cardiovascular risk factors include advanced age (older than 34 for men, 48 for women) and hypertension.  Use of agents associated with hypertension: none.     Weight trend: stable Wt Readings from Last 3 Encounters:  06/01/19 142 lb (64.4 kg)  03/28/19 135 lb (61.2 kg)  01/20/19 144 lb 12.8 oz (65.7 kg)    Current diet: in general, a "healthy" diet     ------------------------------------------------------------------------  At baseline, patient has right ear with hearing loss after previous stroke.  About a week ago, she started getting vertigo and hearing loss in her left ear.  This is not complete hearing loss.  She is never had anything like this before.  She denies any facial droop, speech difficulties, or other neurologic changes.   Allergies  Allergen Reactions  . Levaquin [Levofloxacin] Nausea Only  . Vicodin [Hydrocodone-Acetaminophen] Nausea Only     Current Outpatient Medications:  .  albuterol (ACCUNEB) 0.63 MG/3ML nebulizer solution, Take 1 ampule by nebulization every 6 (six) hours as needed for wheezing., Disp: , Rfl:  .  albuterol (PROVENTIL HFA;VENTOLIN HFA) 108 (90 Base) MCG/ACT inhaler, INHALE 2 PUFFS INTO THE LUNGS EVERY 6 HOURS AS NEEDED FOR WHEEZING ORSHORTNESS OF BREATH, Disp: 8.5 g, Rfl: 2 .  alendronate (FOSAMAX) 70 MG tablet, One tab every Sunday on empty stomach with a full glass of water. Do not lie down or eat/drink anything else for the next 30 min., Disp: , Rfl:  .  aspirin 81 MG tablet, Take 81 mg by mouth daily., Disp: , Rfl:  .  betamethasone valerate (VALISONE) 0.1 % cream, APPLY TOPICALLY TO AFFECTED AREA SPARINGLY 2 TIMES DAILY., Disp: 15 g, Rfl: 5 .  BREO ELLIPTA 100-25 MCG/INH AEPB, INHALE 1 PUFF BY MOUTH ONCE DAILY AS DIRECTED, Disp: 180 each, Rfl: 1 .  carbidopa-levodopa (SINEMET CR) 50-200  MG tablet, Take 1 tablet by mouth daily., Disp: , Rfl:  .  cetirizine (ZYRTEC) 10 MG tablet, Take 1 tablet (10 mg total) by mouth daily. (Patient taking differently: Take 10 mg by mouth daily. As needed), Disp: 30 tablet, Rfl: 11 .  Cholecalciferol (VITAMIN D) 2000 UNITS CAPS, Take 1 capsule by mouth daily., Disp: , Rfl:  .  citalopram (CELEXA) 20 MG tablet, Take 1 tablet (20 mg total) by mouth daily., Disp: 90 tablet, Rfl: 3 .  fluticasone (FLONASE) 50 MCG/ACT nasal spray, PLACE TO SPRAYS INTO BOTHS NOSTRILS  DAILY, Disp: 16 g, Rfl: 6 .  lisinopril (ZESTRIL) 10 MG tablet, Take by mouth., Disp: , Rfl:  .  montelukast (SINGULAIR) 10 MG tablet, TAKE 1 TABLET BY MOUTH ONCE DAILY, Disp: 90 tablet, Rfl: 1 .  nystatin (MYCOSTATIN) 100000 UNIT/ML suspension, Take 5 mLs (500,000 Units total) by mouth 4 (four) times daily. Until resolution, Disp: 300 mL, Rfl: 0 .  omeprazole (PRILOSEC) 40 MG capsule, Take 40 mg by mouth daily. , Disp: , Rfl:  .  ondansetron (ZOFRAN) 4 MG tablet, Take 1 tablet (4 mg total) by mouth every 8 (eight) hours as needed for nausea or vomiting., Disp: 20 tablet, Rfl: 1 .  potassium chloride SA (K-DUR,KLOR-CON) 20 MEQ tablet, Take 1 tablet (20 mEq total) by mouth daily., Disp: 60 tablet, Rfl: 5 .  simvastatin (ZOCOR) 40 MG tablet, TAKE 1 TABLET BY MOUTH AT BEDTIME, Disp: 90 tablet, Rfl: 1 .  SPIRIVA RESPIMAT 2.5 MCG/ACT AERS, INHALE 2 PUFFS ONCE DAILY (TO REPLACE THE HANDIHALER), Disp: 12 g, Rfl: 3 .  Abaloparatide (TYMLOS) 3120 MCG/1.56ML SOPN, Inject into the skin daily. , Disp: , Rfl:  .  carbidopa-levodopa (SINEMET IR) 25-100 MG tablet, Take 2 tablets by mouth 3 (three) times daily. , Disp: , Rfl:   Review of Systems  Constitutional: Negative.   HENT: Positive for ear pain and hearing loss (Left ear.  Started last week.).   Respiratory: Positive for cough (History of COPD pt states this is stable.). Negative for apnea, choking, chest tightness, shortness of breath, wheezing and stridor.   Cardiovascular: Negative.   Gastrointestinal: Negative.  Negative for abdominal pain.  Genitourinary: Negative.   Neurological: Positive for light-headedness. Negative for dizziness, tingling, loss of consciousness, numbness and headaches.    Social History   Tobacco Use  . Smoking status: Former Smoker    Packs/day: 1.00    Years: 30.00    Pack years: 30.00    Types: Cigarettes    Quit date: 08/01/2008    Years since quitting: 10.8  . Smokeless tobacco: Never Used  Substance Use Topics   . Alcohol use: Yes    Alcohol/week: 1.0 - 2.0 standard drinks    Types: 1 - 2 Shots of liquor per week      Objective:   BP (!) 167/79 (BP Location: Right Arm, Patient Position: Sitting, Cuff Size: Normal)   Pulse 97   Temp 98.5 F (36.9 C) (Oral)   Wt 142 lb (64.4 kg)   BMI 26.83 kg/m  Vitals:   06/01/19 1338  BP: (!) 167/79  Pulse: 97  Temp: 98.5 F (36.9 C)  TempSrc: Oral  Weight: 142 lb (64.4 kg)     Physical Exam Vitals signs reviewed.  Constitutional:      General: She is not in acute distress.    Appearance: Normal appearance. She is well-developed. She is not diaphoretic.  HENT:     Head: Normocephalic and atraumatic.  Right Ear: Tympanic membrane, ear canal and external ear normal.     Left Ear: Tympanic membrane, ear canal and external ear normal.  Eyes:     General: No scleral icterus.    Conjunctiva/sclera: Conjunctivae normal.  Neck:     Musculoskeletal: Neck supple.     Thyroid: No thyromegaly.  Cardiovascular:     Rate and Rhythm: Normal rate and regular rhythm.     Pulses: Normal pulses.     Heart sounds: Normal heart sounds. No murmur.  Pulmonary:     Effort: Pulmonary effort is normal. No respiratory distress.     Breath sounds: Normal breath sounds. No wheezing, rhonchi or rales.  Abdominal:     General: There is no distension.     Palpations: Abdomen is soft.     Tenderness: There is no abdominal tenderness.  Musculoskeletal:     Right lower leg: No edema.     Left lower leg: No edema.  Lymphadenopathy:     Cervical: No cervical adenopathy.  Skin:    General: Skin is warm and dry.     Capillary Refill: Capillary refill takes less than 2 seconds.     Findings: No rash.     Comments: + Senile purpura on bilateral arms.  2 cm superficial skin tear of left lateral elbow  Neurological:     Mental Status: She is alert and oriented to person, place, and time. Mental status is at baseline.     Comments: Cranial nerves are intact.  She  does have shuffling gait and cogwheel rigidity at baseline.  Strength and sensation are at baseline.  She does report hearing loss in bilateral ears, but is able to hear enough to answer questions appropriately.  No facial asymmetry  Psychiatric:        Mood and Affect: Mood normal.        Behavior: Behavior normal.      No results found for any visits on 06/01/19.     Assessment & Plan   Problem List Items Addressed This Visit      Cardiovascular and Mediastinum   Benign essential HTN - Primary    Previously well controlled, but elevated today She was just started on lisinopril 10 mg daily by cardiology less than 1 month ago She attributes elevated blood pressure today to recent fall and feeling anxious and overwhelmed Advised monitoring home blood pressure and to let me know if this remains elevated and we will does titrate      Relevant Medications   lisinopril (ZESTRIL) 10 MG tablet   Other Relevant Orders   Lipid panel   Comprehensive metabolic panel   Senile purpura (HCC)    Chronic and stable      Relevant Medications   lisinopril (ZESTRIL) 10 MG tablet     Nervous and Auditory   Primary Parkinsonism (HCC)    Followed by neurology Shuffling gait and cogwheel rigidity at baseline today Likely contributes to her fall risk      Hearing loss    New problem in her left ear, associated with vertigo Not complete hearing loss Concern for possible Mnire's disease She is otherwise neuro intact today and therefore suspicion for repeat stroke is low Referral to ENT If ENT does not feel like this is related to Mnire's disease, may need to consider MRI brain      Relevant Orders   Ambulatory referral to ENT     Other   Combined fat and carbohydrate induced hyperlipemia  Continue simvastatin Recheck lipid panel and CMP      Relevant Medications   lisinopril (ZESTRIL) 10 MG tablet   Other Relevant Orders   Lipid panel   Comprehensive metabolic panel    History of CVA (cerebrovascular accident)    Continue statin Discussed importance of good blood pressure control Recheck labs today      Relevant Orders   Lipid panel   Comprehensive metabolic panel   Vertigo    New problem and associated with hearing loss unilaterally Benign exam today without nystagmus Concern for possible Mnire's disease ENT referral as above      Relevant Orders   Ambulatory referral to ENT    Other Visit Diagnoses    Fall, initial encounter           Return in about 3 months (around 09/01/2019) for chronic disease f/u.   The entirety of the information documented in the History of Present Illness, Review of Systems and Physical Exam were personally obtained by me. Portions of this information were initially documented by Ashley Royalty, CMA and reviewed by me for thoroughness and accuracy.    Whitten Andreoni, Dionne Bucy, MD MPH [facility]

## 2019-06-01 NOTE — Assessment & Plan Note (Signed)
Continue statin Discussed importance of good blood pressure control Recheck labs today

## 2019-06-01 NOTE — Assessment & Plan Note (Signed)
New problem in her left ear, associated with vertigo Not complete hearing loss Concern for possible Mnire's disease She is otherwise neuro intact today and therefore suspicion for repeat stroke is low Referral to ENT If ENT does not feel like this is related to Mnire's disease, may need to consider MRI brain

## 2019-06-01 NOTE — Assessment & Plan Note (Signed)
New problem and associated with hearing loss unilaterally Benign exam today without nystagmus Concern for possible Mnire's disease ENT referral as above

## 2019-06-02 ENCOUNTER — Telehealth: Payer: Self-pay | Admitting: *Deleted

## 2019-06-02 DIAGNOSIS — E7800 Pure hypercholesterolemia, unspecified: Secondary | ICD-10-CM

## 2019-06-02 DIAGNOSIS — R7989 Other specified abnormal findings of blood chemistry: Secondary | ICD-10-CM

## 2019-06-02 LAB — COMPREHENSIVE METABOLIC PANEL
ALT: 40 IU/L — ABNORMAL HIGH (ref 0–32)
AST: 68 IU/L — ABNORMAL HIGH (ref 0–40)
Albumin/Globulin Ratio: 1.8 (ref 1.2–2.2)
Albumin: 4.8 g/dL — ABNORMAL HIGH (ref 3.7–4.7)
Alkaline Phosphatase: 129 IU/L — ABNORMAL HIGH (ref 39–117)
BUN/Creatinine Ratio: 15 (ref 12–28)
BUN: 12 mg/dL (ref 8–27)
Bilirubin Total: 1.1 mg/dL (ref 0.0–1.2)
CO2: 23 mmol/L (ref 20–29)
Calcium: 9.5 mg/dL (ref 8.7–10.3)
Chloride: 96 mmol/L (ref 96–106)
Creatinine, Ser: 0.81 mg/dL (ref 0.57–1.00)
GFR calc Af Amer: 85 mL/min/{1.73_m2} (ref >59)
GFR calc non Af Amer: 73 mL/min/{1.73_m2} (ref >59)
Globulin, Total: 2.7 g/dL (ref 1.5–4.5)
Glucose: 112 mg/dL — ABNORMAL HIGH (ref 65–99)
Potassium: 4.3 mmol/L (ref 3.5–5.2)
Sodium: 138 mmol/L (ref 134–144)
Total Protein: 7.5 g/dL (ref 6.0–8.5)

## 2019-06-02 LAB — LIPID PANEL
Chol/HDL Ratio: 3.6 ratio (ref 0.0–4.4)
Cholesterol, Total: 161 mg/dL (ref 100–199)
HDL: 45 mg/dL (ref >39)
LDL Calculated: 87 mg/dL (ref 0–99)
Triglycerides: 145 mg/dL (ref 0–149)
VLDL Cholesterol Cal: 29 mg/dL (ref 5–40)

## 2019-06-02 MED ORDER — ATORVASTATIN CALCIUM 40 MG PO TABS: 40.0000 mg | ORAL_TABLET | Freq: Every day | ORAL | 1 refills | Status: DC

## 2019-06-02 NOTE — Telephone Encounter (Signed)
-----   Message from Virginia Crews, MD sent at 06/02/2019 11:59 AM EDT ----- OK to e-prescribe Atorvastatin 40mg  daily #30 r5. Schedule f/u appt in 3 months for choelsterol. Leave lab slip for CMP at front to come back in 1 month for labs only

## 2019-06-13 ENCOUNTER — Other Ambulatory Visit: Payer: Self-pay | Admitting: Internal Medicine

## 2019-06-13 DIAGNOSIS — J449 Chronic obstructive pulmonary disease, unspecified: Secondary | ICD-10-CM

## 2019-06-22 ENCOUNTER — Other Ambulatory Visit: Payer: Self-pay

## 2019-06-22 ENCOUNTER — Encounter: Payer: Self-pay | Admitting: Internal Medicine

## 2019-06-22 ENCOUNTER — Ambulatory Visit: Payer: Medicare Other | Admitting: Internal Medicine

## 2019-06-22 VITALS — BP 104/72 | HR 77 | Resp 16 | Ht 63.0 in | Wt 142.4 lb

## 2019-06-22 DIAGNOSIS — R0602 Shortness of breath: Secondary | ICD-10-CM | POA: Diagnosis not present

## 2019-06-22 DIAGNOSIS — J449 Chronic obstructive pulmonary disease, unspecified: Secondary | ICD-10-CM | POA: Diagnosis not present

## 2019-06-22 DIAGNOSIS — J301 Allergic rhinitis due to pollen: Secondary | ICD-10-CM

## 2019-06-22 DIAGNOSIS — G20 Parkinson's disease: Secondary | ICD-10-CM | POA: Diagnosis not present

## 2019-06-22 DIAGNOSIS — K219 Gastro-esophageal reflux disease without esophagitis: Secondary | ICD-10-CM

## 2019-06-22 MED ORDER — CETIRIZINE HCL 10 MG PO TABS: 10.0000 mg | ORAL_TABLET | Freq: Every day | ORAL | 10 refills | Status: DC

## 2019-06-22 MED ORDER — CETIRIZINE HCL 10 MG PO TABS: 10.0000 mg | ORAL_TABLET | Freq: Every day | ORAL | 3 refills | Status: DC

## 2019-06-22 NOTE — Progress Notes (Signed)
[facility] [place], [place] [address]  Pulmonary Sleep Medicine   Office Visit Note  Patient Name: Lori Brady DOB: 1946-07-10 MRN [id number]  Date of Service: 06/22/2019  Complaints/HPI: Pt is here for follow up on COPD, GERD and parkinson's.  She reports here breathing has been doing well.  She has not been hospitalized recently.  She is using her inhalers as prescribed. She is using spiriva, and breo, as well as proair.    ROS  General: (-) fever, (-) chills, (-) night sweats, (-) weakness Skin: (-) rashes, (-) itching,. Eyes: (-) visual changes, (-) redness, (-) itching. Nose and Sinuses: (-) nasal stuffiness or itchiness, (-) postnasal drip, (-) nosebleeds, (-) sinus trouble. Mouth and Throat: (-) sore throat, (-) hoarseness. Neck: (-) swollen glands, (-) enlarged thyroid, (-) neck pain. Respiratory: - cough, (-) bloody sputum, - shortness of breath, - wheezing. Cardiovascular: - ankle swelling, (-) chest pain. Lymphatic: (-) lymph node enlargement. Neurologic: (-) numbness, (-) tingling. Psychiatric: (-) anxiety, (-) depression   Current Medication: Outpatient Encounter Medications as of 06/22/2019  Medication Sig  . albuterol (ACCUNEB) 0.63 MG/3ML nebulizer solution Take 1 ampule by nebulization every 6 (six) hours as needed for wheezing.  Marland Kitchen albuterol (VENTOLIN HFA) 108 (90 Base) MCG/ACT inhaler INHALE 2 PUFFS INTO THE LUNGS EVERY 6 HOURS AS NEEDED FOR WHEEZING ORSHORTNESS OF BREATH  . alendronate (FOSAMAX) 70 MG tablet One tab every Sunday on empty stomach with a full glass of water. Do not lie down or eat/drink anything else for the next 30 min.  Marland Kitchen aspirin 81 MG tablet Take 81 mg by mouth daily.  Marland Kitchen atorvastatin (LIPITOR) 40 MG tablet Take 1 tablet (40 mg total) by mouth daily.  . betamethasone valerate (VALISONE) 0.1 % cream APPLY TOPICALLY TO AFFECTED AREA SPARINGLY 2 TIMES DAILY.  Marland Kitchen BREO ELLIPTA 100-25 MCG/INH AEPB INHALE 1 PUFF BY MOUTH  ONCE DAILY AS DIRECTED  . carbidopa-levodopa (SINEMET CR) 50-200 MG tablet Take 1 tablet by mouth daily.  . cetirizine (ZYRTEC) 10 MG tablet Take 1 tablet (10 mg total) by mouth daily. (Patient taking differently: Take 10 mg by mouth daily. As needed)  . Cholecalciferol (VITAMIN D) 2000 UNITS CAPS Take 1 capsule by mouth daily.  . citalopram (CELEXA) 20 MG tablet Take 1 tablet (20 mg total) by mouth daily.  . fluticasone (FLONASE) 50 MCG/ACT nasal spray PLACE TO SPRAYS INTO BOTHS NOSTRILS DAILY  . lisinopril (ZESTRIL) 10 MG tablet Take by mouth.  . montelukast (SINGULAIR) 10 MG tablet TAKE 1 TABLET BY MOUTH ONCE DAILY  . nystatin (MYCOSTATIN) 100000 UNIT/ML suspension Take 5 mLs (500,000 Units total) by mouth 4 (four) times daily. Until resolution  . omeprazole (PRILOSEC) 40 MG capsule Take 40 mg by mouth daily.   . ondansetron (ZOFRAN) 4 MG tablet Take 1 tablet (4 mg total) by mouth every 8 (eight) hours as needed for nausea or vomiting.  . potassium chloride SA (K-DUR,KLOR-CON) 20 MEQ tablet Take 1 tablet (20 mEq total) by mouth daily.  . simvastatin (ZOCOR) 40 MG tablet TAKE 1 TABLET BY MOUTH AT BEDTIME  . SPIRIVA RESPIMAT 2.5 MCG/ACT AERS INHALE 2 PUFFS ONCE DAILY (TO REPLACE THE HANDIHALER)  . carbidopa-levodopa (SINEMET IR) 25-100 MG tablet Take 2 tablets by mouth 3 (three) times daily.   . [DISCONTINUED] Abaloparatide (TYMLOS) 3120 MCG/1.56ML SOPN Inject into the skin daily.    No facility-administered encounter medications on file as of 06/22/2019.     Surgical History: Past  Surgical History:  Procedure Laterality Date  . BREAST BIOPSY Bilateral 1960's to 90's  . breast cysts    . CATARACT EXTRACTION, BILATERAL    . right wrist fracture    . TUBAL LIGATION      Medical History: Past Medical History:  Diagnosis Date  . Arthritis   . Asthma   . Crohn's disease (HCC)   . Emphysema of lung (HCC)   . GERD (gastroesophageal reflux disease)   . Hip fracture (HCC) 01/26/2017  .  History of chicken pox   . History of heart attack   . Hx of completed stroke   . Hypercholesteremia   . Hypertension   . Parkinson disease (HCC)   . Pneumonia   . Seasonal allergies   . Sepsis (HCC)   . Septic shock (HCC) 10/08/2016  . UTI (lower urinary tract infection)     Family History: Family History  Problem Relation Age of Onset  . Cancer Sister        ovarian/uterine/breast  . Breast cancer Sister 68  . Cancer Sister        breast  . Heart disease Sister   . Breast cancer Sister 53  . Heart disease Father   . Cancer Father        liver  . Pancreatic cancer Mother   . Heart disease Brother   . Heart attack Brother   . Cancer Brother        liver  . Heart disease Sister   . Diabetes Maternal Grandmother     Social History: Social History   Socioeconomic History  . Marital status: Widowed    Spouse name: Not on file  . Number of children: 3  . Years of education: Not on file  . Highest education level: Some college, no degree  Occupational History  . Occupation: retired  Engineer, production  . Financial resource strain: Not hard at all  . Food insecurity    Worry: Never true    Inability: Never true  . Transportation needs    Medical: No    Non-medical: No  Tobacco Use  . Smoking status: Former Smoker    Packs/day: 1.00    Years: 30.00    Pack years: 30.00    Types: Cigarettes    Quit date: 08/01/2008    Years since quitting: 10.8  . Smokeless tobacco: Never Used  Substance and Sexual Activity  . Alcohol use: Never    Frequency: Never  . Drug use: No  . Sexual activity: Not on file  Lifestyle  . Physical activity    Days per week: 0 days    Minutes per session: 0 min  . Stress: Not at all  Relationships  . Social Musician on phone: Patient refused    Gets together: Patient refused    Attends religious service: Patient refused    Active member of club or organization: Patient refused    Attends meetings of clubs or organizations:  Patient refused    Relationship status: Patient refused  . Intimate partner violence    Fear of current or ex partner: Patient refused    Emotionally abused: Patient refused    Physically abused: Patient refused    Forced sexual activity: Patient refused  Other Topics Concern  . Not on file  Social History Narrative  . Not on file    Vital Signs: Blood pressure 104/72, pulse 77, resp. rate 16, height 5\' 3"  (1.6 m), weight 142 lb  6.4 oz (64.6 kg), SpO2 96 %.  Examination: General Appearance: The patient is well-developed, well-nourished, and in no distress. Skin: Gross inspection of skin unremarkable. Head: normocephalic, no gross deformities. Eyes: no gross deformities noted. ENT: ears appear grossly normal no exudates. Neck: Supple. No thyromegaly. No LAD. Respiratory: clear bilateraly. Cardiovascular: Normal S1 and S2 without murmur or rub. Extremities: No cyanosis. pulses are equal. Neurologic: Alert and oriented. No involuntary movements.  LABS: Recent Results (from the past 2160 hour(s))  Lipid panel     Status: None   Collection Time: 06/01/19  2:11 PM  Result Value Ref Range   Cholesterol, Total 161 100 - 199 mg/dL   Triglycerides 409145 0 - 149 mg/dL   HDL 45 >81>39 mg/dL   VLDL Cholesterol Cal 29 5 - 40 mg/dL   LDL Calculated 87 0 - 99 mg/dL   Chol/HDL Ratio 3.6 0.0 - 4.4 ratio    Comment:                                   T. Chol/HDL Ratio                                             Men  Women                               1/2 Avg.Risk  3.4    3.3                                   Avg.Risk  5.0    4.4                                2X Avg.Risk  9.6    7.1                                3X Avg.Risk 23.4   11.0   Comprehensive metabolic panel     Status: Abnormal   Collection Time: 06/01/19  2:11 PM  Result Value Ref Range   Glucose 112 (H) 65 - 99 mg/dL   BUN 12 8 - 27 mg/dL   Creatinine, Ser 1.910.81 0.57 - 1.00 mg/dL   GFR calc non Af Amer 73 >59 mL/min/1.73    GFR calc Af Amer 85 >59 mL/min/1.73   BUN/Creatinine Ratio 15 12 - 28   Sodium 138 134 - 144 mmol/L   Potassium 4.3 3.5 - 5.2 mmol/L   Chloride 96 96 - 106 mmol/L   CO2 23 20 - 29 mmol/L   Calcium 9.5 8.7 - 10.3 mg/dL   Total Protein 7.5 6.0 - 8.5 g/dL   Albumin 4.8 (H) 3.7 - 4.7 g/dL   Globulin, Total 2.7 1.5 - 4.5 g/dL   Albumin/Globulin Ratio 1.8 1.2 - 2.2   Bilirubin Total 1.1 0.0 - 1.2 mg/dL   Alkaline Phosphatase 129 (H) 39 - 117 IU/L   AST 68 (H) 0 - 40 IU/L   ALT 40 (H) 0 - 32 IU/L    Radiology: Ct Abdomen Pelvis Wo Contrast  Result  Date: 07/16/2018 CLINICAL DATA:  Nausea and vomiting today.  Altered mental status. EXAM: CT ABDOMEN AND PELVIS WITHOUT CONTRAST TECHNIQUE: Multidetector CT imaging of the abdomen and pelvis was performed following the standard protocol without IV contrast. COMPARISON:  CT of the pelvis on 01/26/2017 FINDINGS: Lower chest: Coronary artery calcifications are present. No pulmonary nodules, pleural effusions, or infiltrates. Hepatobiliary: There is diffuse low-attenuation of the liver. No focal liver lesions. Gallbladder is present. Pancreas: Unremarkable. No pancreatic ductal dilatation or surrounding inflammatory changes. Spleen: Normal in size without focal abnormality. Adrenals/Urinary Tract: Adrenal glands are normal in appearance. There are small bilateral renal cysts. No intrarenal calculi in or ureteral obstruction. The bladder and visualized urethra are unremarkable. Stomach/Bowel: Small hiatal hernia. The stomach is otherwise normal. Small bowel loops are normal in appearance. There is fatty deposition within the wall of the colon. The colon is decompressed. No suspicious mass identified. Vascular/Lymphatic: There is atherosclerotic calcification of the abdominal aorta not associated with aneurysm. No retroperitoneal or mesenteric adenopathy. Reproductive: The uterus is present.  No adnexal mass. Other: No free pelvic fluid. Anterior abdominal wall is  unremarkable. Musculoskeletal: There are chronic wedge compression fractures of T9 and L5. There is generalized sclerosis of the sacrum, raising the question of remote versus insufficiency fractures. Remote fractures of the inferior pubic rami bilaterally. IMPRESSION: 1. Hepatic steatosis.  No focal liver lesions. 2. Chronic changes in the wall of the colon, consistent with chronic inflammation. No acute inflammation or acute diverticulitis. 3. Coronary artery calcifications. Aortic atherosclerosis. (ICD10-I70.0) 4. Small hiatal hernia. 5. Chronic pelvic fractures. Sclerosis of the sacrum suggests remote sacral fracture or insufficiency fractures. Electronically Signed   By: Norva PavlovElizabeth  Brown M.D.   On: 07/16/2018 16:12   Dg Chest 1 View  Result Date: 07/17/2018 CLINICAL DATA:  Short of breath.  Asthma EXAM: CHEST  1 VIEW COMPARISON:  07/06/2018 FINDINGS: Heart size mildly enlarged. Negative for heart failure. Atherosclerotic aorta. Small right effusion Question of bilateral small lung nodules right greater than left, not definitely seen on prior studies. Multiple chronic rib fractures on the right. IMPRESSION: Small right pleural effusion. Negative for heart failure or pneumonia Question lung nodules bilaterally right greater than left. Follow-up chest two-view for further evaluation. Chest CT may also be necessary to resolve Electronically Signed   By: Marlan Palauharles  Brew M.D.   On: 07/17/2018 15:01   Ct Head Wo Contrast  Result Date: 07/16/2018 CLINICAL DATA:  Acute onset nausea and vomiting today.  Headache. EXAM: CT HEAD WITHOUT CONTRAST TECHNIQUE: Contiguous axial images were obtained from the base of the skull through the vertex without intravenous contrast. COMPARISON:  Brain MRI 11/03/2017. FINDINGS: Brain: No evidence of acute infarction, hemorrhage, hydrocephalus, extra-axial collection or mass lesion/mass effect. Atrophy, chronic microvascular ischemic change and remote left basal ganglia infarct are  identified. Vascular: Extensive atherosclerotic vascular disease is seen. Skull: Intact.  No focal lesion. Sinuses/Orbits: Status post bilateral lens extraction. Otherwise negative. Other: None. IMPRESSION: No acute abnormality. Atrophy, chronic microvascular ischemic change remote left basal ganglia infarct. Atherosclerosis. Electronically Signed   By: Drusilla Kannerhomas  Dalessio M.D.   On: 07/16/2018 13:09   Dg Abdomen Acute W/chest  Result Date: 07/16/2018 CLINICAL DATA:  Emesis, emphysema, former smoker EXAM: DG ABDOMEN ACUTE W/ 1V CHEST COMPARISON:  03/04/2017 FINDINGS: Normal heart size and vascularity. Lungs remain clear. No focal pneumonia, collapse or consolidation. Negative for edema, effusion or pneumothorax. Trachea is midline. Aorta is atherosclerotic. No free air. Scattered air throughout the bowel. Negative for significant  dilatation or obstruction. Remote healed inferior rami fractures noted. Bones are osteopenic. IMPRESSION: No acute or significant finding by plain radiography. Electronically Signed   By: Judie PetitM.  Shick M.D.   On: 07/16/2018 09:41    No results found.  No results found.    Assessment and Plan: Patient Active Problem List   Diagnosis Date Noted  . Hearing loss 06/01/2019  . Vertigo 06/01/2019  . Hypokalemia 07/22/2018  . Oral thrush 07/22/2018  . Gastroenteritis 07/16/2018  . Primary Parkinsonism (HCC) 01/05/2018  . SIADH (syndrome of inappropriate ADH production) (HCC) 10/01/2017  . Spinal stenosis 10/01/2017  . Elevated troponin I level 11/18/2016  . COPD (chronic obstructive pulmonary disease) with chronic bronchitis (HCC) 08/12/2016  . Asthma without status asthmaticus 03/31/2016  . Osteoporosis 03/31/2016  . Fatty liver determined by biopsy 09/17/2015  . Parotid swelling 09/13/2015  . Urticaria 09/13/2015  . History of CVA (cerebrovascular accident) 08/21/2015  . Senile purpura (HCC) 08/21/2015  . Atrial tachycardia (HCC) 08/13/2015  . Eczema 07/10/2015  .  Allergic rhinitis 06/26/2015  . Anxiety 04/23/2015  . Edema leg 03/27/2015  . Generalized weakness 03/06/2015  . Primary cardiomyopathy (HCC) 03/05/2015  . CAD (coronary artery disease) 03/05/2015  . Benign essential HTN 02/28/2015  . Combined fat and carbohydrate induced hyperlipemia 09/06/2014  . Acid reflux 08/01/2014    1. Obstructive chronic bronchitis without exacerbation (HCC) Continue current medications, COPD stable.    2. Primary Parkinsonism (HCC) Continue current therapy, see neurology as scheduled.  3. Gastroesophageal reflux disease without esophagitis continue Prilosec as prescribed.   4. Seasonal allergic rhinitis due to pollen Refilled zyrtec - cetirizine (ZYRTEC) 10 MG tablet; Take 1 tablet (10 mg total) by mouth daily.  Dispense: 90 tablet; Refill: 3  5. SOB (shortness of breath) - Spirometry with Graph  General Counseling: I have discussed the findings of the evaluation and examination with Brailee.  I have also discussed any further diagnostic evaluation thatmay be needed or ordered today. Maecyn verbalizes understanding of the findings of todays visit. We also reviewed her medications today and discussed drug interactions and side effects including but not limited excessive drowsiness and altered mental states. We also discussed that there is always a risk not just to her but also people around her. she has been encouraged to call the office with any questions or concerns that should arise related to todays visit.    Time spent: 15 This patient was seen by Blima LedgerAdam Srah Ake AGNP-C in Collaboration with Dr. Freda MunroSaadat Khan as a part of collaborative care agreement.   I have personally obtained a history, examined the patient, evaluated laboratory and imaging results, formulated the assessment and plan and placed orders.    Yevonne PaxSaadat A Khan, MD [facility] Pulmonary and Critical Care Sleep medicine

## 2019-07-25 ENCOUNTER — Other Ambulatory Visit: Payer: Self-pay | Admitting: Physician Assistant

## 2019-08-03 ENCOUNTER — Other Ambulatory Visit: Payer: Self-pay | Admitting: Family Medicine

## 2019-09-04 ENCOUNTER — Other Ambulatory Visit: Payer: Self-pay | Admitting: Family Medicine

## 2019-09-04 DIAGNOSIS — E7800 Pure hypercholesterolemia, unspecified: Secondary | ICD-10-CM

## 2019-09-05 ENCOUNTER — Encounter: Payer: Self-pay | Admitting: Family Medicine

## 2019-09-05 ENCOUNTER — Ambulatory Visit: Payer: Medicare Other | Admitting: Family Medicine

## 2019-09-05 ENCOUNTER — Other Ambulatory Visit: Payer: Self-pay

## 2019-09-05 VITALS — BP 102/66 | HR 93 | Temp 97.1°F | Wt 146.0 lb

## 2019-09-05 DIAGNOSIS — Z23 Encounter for immunization: Secondary | ICD-10-CM

## 2019-09-05 DIAGNOSIS — I428 Other cardiomyopathies: Secondary | ICD-10-CM | POA: Diagnosis not present

## 2019-09-05 DIAGNOSIS — I10 Essential (primary) hypertension: Secondary | ICD-10-CM

## 2019-09-05 DIAGNOSIS — K5010 Crohn's disease of large intestine without complications: Secondary | ICD-10-CM | POA: Diagnosis not present

## 2019-09-05 DIAGNOSIS — J449 Chronic obstructive pulmonary disease, unspecified: Secondary | ICD-10-CM

## 2019-09-05 DIAGNOSIS — E782 Mixed hyperlipidemia: Secondary | ICD-10-CM

## 2019-09-05 DIAGNOSIS — K760 Fatty (change of) liver, not elsewhere classified: Secondary | ICD-10-CM

## 2019-09-05 DIAGNOSIS — Z8673 Personal history of transient ischemic attack (TIA), and cerebral infarction without residual deficits: Secondary | ICD-10-CM

## 2019-09-05 DIAGNOSIS — G20 Parkinson's disease: Secondary | ICD-10-CM

## 2019-09-05 DIAGNOSIS — E222 Syndrome of inappropriate secretion of antidiuretic hormone: Secondary | ICD-10-CM

## 2019-09-05 DIAGNOSIS — I471 Supraventricular tachycardia: Secondary | ICD-10-CM

## 2019-09-05 DIAGNOSIS — I429 Cardiomyopathy, unspecified: Secondary | ICD-10-CM

## 2019-09-05 NOTE — Patient Instructions (Addendum)
Ray Church, MD  Neurology  [place] [address]    Phone: [phone]  Fax: [phone]    No changes to medications today

## 2019-09-05 NOTE — Assessment & Plan Note (Signed)
LDL not at goal when last checked She is tolerating atorvastatin well-continue Recheck lipid panel and LFTs

## 2019-09-05 NOTE — Assessment & Plan Note (Signed)
LFTs were elevated when checked recently Recheck LFTs today Avoid alcohol Discussed dietary control

## 2019-09-05 NOTE — Assessment & Plan Note (Signed)
Followed by endocrinology 

## 2019-09-05 NOTE — Assessment & Plan Note (Signed)
History of Takotsubo stress-induced in 2009 Followed by cardiology Blood pressure well controlled and currently taking statin

## 2019-09-05 NOTE — Assessment & Plan Note (Signed)
Well controlled Continue current medications Reviewed recent metabolic panel F/u in 3-6 months  

## 2019-09-05 NOTE — Assessment & Plan Note (Signed)
No recent flares Followed by GI

## 2019-09-05 NOTE — Progress Notes (Signed)
Patient: Lori Brady Female    DOB: [birth year]   73 y.o.   MRN: [id number] Visit Date: 09/05/2019  Today's Provider: Shirlee Latch, MD   Chief Complaint  Patient presents with  . Hypertension  . Hyperlipidemia   Subjective:     HPI    Hypertension, follow-up:  BP Readings from Last 3 Encounters:  09/05/19 102/66  06/22/19 104/72  06/01/19 (!) 167/79    She was last seen for hypertension 3 months ago.  BP at that visit was 167/79. Management since that visit includes no changes in medication, just monitor at home. She reports excellent compliance with treatment. She is not having side effects.  She is exercising. She is not adherent to low salt diet.   Outside blood pressures are being checked occasionally.  She states her blood pressure is "Normal". She is experiencing none.  Patient denies chest pain, irregular heart beat, lower extremity edema and palpitations.   Cardiovascular risk factors include advanced age (older than 48 for men, 36 for women), dyslipidemia and hypertension.  Use of agents associated with hypertension: none.   ------------------------------------------------------------------------    Lipid/Cholesterol, Follow-up:   Last seen for this 3 months ago.  Management since that visit includes Started Atorvastatin 40mg .  Last Lipid Panel:    Component Value Date/Time   CHOL 161 06/01/2019 1411   CHOL 133 12/07/2011 0314   TRIG 145 06/01/2019 1411   TRIG 71 12/07/2011 0314   HDL 45 06/01/2019 1411   HDL 56 12/07/2011 0314   CHOLHDL 3.6 06/01/2019 1411   VLDL 14 12/07/2011 0314   LDLCALC 87 06/01/2019 1411   LDLCALC 63 12/07/2011 0314    She reports excellent compliance with treatment. She is not having side effects.   Wt Readings from Last 3 Encounters:  09/05/19 146 lb (66.2 kg)  06/22/19 142 lb 6.4 oz (64.6 kg)  06/01/19 142 lb (64.4 kg)    ------------------------------------------------------------------------     Allergies  Allergen Reactions  . Levaquin [Levofloxacin] Nausea Only  . Vicodin [Hydrocodone-Acetaminophen] Nausea Only     Current Outpatient Medications:  .  albuterol (ACCUNEB) 0.63 MG/3ML nebulizer solution, Take 1 ampule by nebulization every 6 (six) hours as needed for wheezing., Disp: , Rfl:  .  albuterol (VENTOLIN HFA) 108 (90 Base) MCG/ACT inhaler, INHALE 2 PUFFS INTO THE LUNGS EVERY 6 HOURS AS NEEDED FOR WHEEZING ORSHORTNESS OF BREATH, Disp: 8.5 g, Rfl: 2 .  alendronate (FOSAMAX) 70 MG tablet, One tab every Sunday on empty stomach with a full glass of water. Do not lie down or eat/drink anything else for the next 30 min., Disp: , Rfl:  .  aspirin 81 MG tablet, Take 81 mg by mouth daily., Disp: , Rfl:  .  atorvastatin (LIPITOR) 40 MG tablet, TAKE 1 TABLET BY MOUTH AT BEDTIME **STOPSIMVASTATIN**, Disp: 90 tablet, Rfl: 1 .  betamethasone valerate (VALISONE) 0.1 % cream, APPLY TOPICALLY TO AFFECTED AREA SPARINGLY 2 TIMES DAILY., Disp: 15 g, Rfl: 5 .  BREO ELLIPTA 100-25 MCG/INH AEPB, INHALE 1 PUFF BY MOUTH ONCE DAILY AS DIRECTED, Disp: 180 each, Rfl: 1 .  carbidopa-levodopa (SINEMET CR) 50-200 MG tablet, Take 1 tablet by mouth daily., Disp: , Rfl:  .  cetirizine (ZYRTEC) 10 MG tablet, Take 1 tablet (10 mg total) by mouth daily., Disp: 90 tablet, Rfl: 3 .  Cholecalciferol (VITAMIN D) 2000 UNITS CAPS, Take 1 capsule by mouth daily., Disp: , Rfl:  .  citalopram (CELEXA) 20 MG tablet,  Take 1 tablet (20 mg total) by mouth daily., Disp: 90 tablet, Rfl: 3 .  fluticasone (FLONASE) 50 MCG/ACT nasal spray, PLACE TO SPRAYS INTO BOTHS NOSTRILS DAILY, Disp: 16 g, Rfl: 6 .  lisinopril (ZESTRIL) 10 MG tablet, Take by mouth., Disp: , Rfl:  .  montelukast (SINGULAIR) 10 MG tablet, TAKE 1 TABLET BY MOUTH ONCE DAILY, Disp: 90 tablet, Rfl: 1 .  nystatin (MYCOSTATIN) 100000 UNIT/ML suspension, Take 5 mLs (500,000 Units total) by mouth 4 (four) times daily. Until resolution, Disp: 300 mL, Rfl: 0 .   omeprazole (PRILOSEC) 40 MG capsule, Take 40 mg by mouth daily. , Disp: , Rfl:  .  ondansetron (ZOFRAN) 4 MG tablet, Take 1 tablet (4 mg total) by mouth every 8 (eight) hours as needed for nausea or vomiting., Disp: 20 tablet, Rfl: 1 .  potassium chloride SA (K-DUR) 20 MEQ tablet, TAKE 1 TABLET BY MOUTH ONCE DAILY, Disp: 60 tablet, Rfl: 5 .  SPIRIVA RESPIMAT 2.5 MCG/ACT AERS, INHALE 2 PUFFS ONCE DAILY (TO REPLACE THE HANDIHALER), Disp: 12 g, Rfl: 3 .  carbidopa-levodopa (SINEMET IR) 25-100 MG tablet, Take 2 tablets by mouth 3 (three) times daily. , Disp: , Rfl:  .  simvastatin (ZOCOR) 40 MG tablet, TAKE 1 TABLET BY MOUTH AT BEDTIME (Patient not taking: Reported on 09/05/2019), Disp: 90 tablet, Rfl: 1  Review of Systems  Constitutional: Negative.   HENT: Positive for hearing loss (Followed by ENT).   Respiratory: Negative.   Cardiovascular: Negative.   Gastrointestinal: Negative.   Neurological: Negative for dizziness, light-headedness and headaches.    Social History   Tobacco Use  . Smoking status: Former Smoker    Packs/day: 1.00    Years: 30.00    Pack years: 30.00    Types: Cigarettes    Quit date: 08/01/2008    Years since quitting: 11.1  . Smokeless tobacco: Never Used  Substance Use Topics  . Alcohol use: Never    Frequency: Never      Objective:   BP 102/66 (BP Location: Left Arm, Patient Position: Sitting, Cuff Size: Normal)   Pulse 93   Temp (!) 97.1 F (36.2 C) (Temporal)   Wt 146 lb (66.2 kg)   BMI 25.86 kg/m  Vitals:   09/05/19 0949  BP: 102/66  Pulse: 93  Temp: (!) 97.1 F (36.2 C)  TempSrc: Temporal  Weight: 146 lb (66.2 kg)  Body mass index is 25.86 kg/m.   Physical Exam Vitals signs reviewed.  Constitutional:      General: She is not in acute distress.    Appearance: Normal appearance. She is well-developed. She is not diaphoretic.  HENT:     Head: Normocephalic and atraumatic.  Eyes:     General: No scleral icterus.    Conjunctiva/sclera:  Conjunctivae normal.  Neck:     Musculoskeletal: Neck supple.     Thyroid: No thyromegaly.  Cardiovascular:     Rate and Rhythm: Normal rate and regular rhythm.     Pulses: Normal pulses.     Heart sounds: Normal heart sounds. No murmur.  Pulmonary:     Effort: Pulmonary effort is normal. No respiratory distress.     Breath sounds: Normal breath sounds. No wheezing, rhonchi or rales.  Musculoskeletal:     Right lower leg: No edema.     Left lower leg: No edema.  Lymphadenopathy:     Cervical: No cervical adenopathy.  Skin:    General: Skin is warm and dry.  Capillary Refill: Capillary refill takes less than 2 seconds.     Findings: No rash.  Neurological:     Mental Status: She is alert and oriented to person, place, and time. Mental status is at baseline.  Psychiatric:        Mood and Affect: Mood normal.        Behavior: Behavior normal.      No results found for any visits on 09/05/19.     Assessment & Plan   Problem List Items Addressed This Visit      Cardiovascular and Mediastinum   Benign essential HTN - Primary    Well controlled Continue current medications Reviewed recent metabolic panel F/u in 3-6 months      Atrial tachycardia ([place])    Rate controlled today Followed by cardiology      Primary cardiomyopathy ([place])    History of Takotsubo stress-induced in 2009 Followed by cardiology Blood pressure well controlled and currently taking statin        Respiratory   COPD (chronic obstructive pulmonary disease) with chronic bronchitis ([facility])    Chronic and well-controlled Continue controller inhalers No recent exacerbations Followed by pulmonology Discussed return precautions        Digestive   Fatty liver determined by biopsy    LFTs were elevated when checked recently Recheck LFTs today Avoid alcohol Discussed dietary control      Relevant Orders   Hepatic function panel   Crohn's disease of colon without complication ([place])    No  recent flares Followed by GI        Endocrine   SIADH (syndrome of inappropriate ADH production) ([place])    Followed by endocrinology        Nervous and Auditory   Primary Parkinsonism ([place])    Followed by neurology No medication changes Information given to call Dr. Trena Platt office to schedule follow-up        Other   Combined fat and carbohydrate induced hyperlipemia    LDL not at goal when last checked She is tolerating atorvastatin well-continue Recheck lipid panel and LFTs      Relevant Orders   Hepatic function panel   Lipid panel   History of CVA (cerebrovascular accident)    Continue statin Discussed importance of good blood pressure control Labs today as above       Other Visit Diagnoses    Need for influenza vaccination       Relevant Orders   Flu Vaccine QUAD High Dose(Fluad) (Completed)       Return in about 6 months (around 03/05/2020) for as scheduled, CPE/AWV.   The entirety of the information documented in the History of Present Illness, Review of Systems and Physical Exam were personally obtained by me. Portions of this information were initially documented by Ashley Royalty, CMA and reviewed by me for thoroughness and accuracy.    Nazir Hacker, Dionne Bucy, MD MPH [facility]

## 2019-09-05 NOTE — Assessment & Plan Note (Signed)
Continue statin Discussed importance of good blood pressure control Labs today as above

## 2019-09-05 NOTE — Assessment & Plan Note (Signed)
Rate controlled today Followed by cardiology

## 2019-09-05 NOTE — Assessment & Plan Note (Signed)
Chronic and well-controlled Continue controller inhalers No recent exacerbations Followed by pulmonology Discussed return precautions

## 2019-09-05 NOTE — Assessment & Plan Note (Signed)
Followed by neurology No medication changes Information given to call Dr. Trena Platt office to schedule follow-up

## 2019-09-06 LAB — HEPATIC FUNCTION PANEL
ALT: 18 IU/L (ref 0–32)
AST: 54 IU/L — ABNORMAL HIGH (ref 0–40)
Albumin: 4.6 g/dL (ref 3.7–4.7)
Alkaline Phosphatase: 121 IU/L — ABNORMAL HIGH (ref 39–117)
Bilirubin Total: 0.9 mg/dL (ref 0.0–1.2)
Bilirubin, Direct: 0.29 mg/dL (ref 0.00–0.40)
Total Protein: 7.1 g/dL (ref 6.0–8.5)

## 2019-09-06 LAB — LIPID PANEL
Chol/HDL Ratio: 3.3 ratio (ref 0.0–4.4)
Cholesterol, Total: 144 mg/dL (ref 100–199)
HDL: 44 mg/dL (ref >39)
LDL Chol Calc (NIH): 80 mg/dL (ref 0–99)
Triglycerides: 107 mg/dL (ref 0–149)
VLDL Cholesterol Cal: 20 mg/dL (ref 5–40)

## 2019-09-21 ENCOUNTER — Other Ambulatory Visit: Payer: Self-pay

## 2019-09-21 ENCOUNTER — Emergency Department
Admission: EM | Admit: 2019-09-21 | Discharge: 2019-09-21 | Disposition: A | Discharge status: Home / Self Care | Payer: Medicare Other | Attending: Emergency Medicine | Admitting: Emergency Medicine

## 2019-09-21 ENCOUNTER — Encounter: Payer: Self-pay | Admitting: Emergency Medicine

## 2019-09-21 ENCOUNTER — Emergency Department: Payer: Medicare Other

## 2019-09-21 DIAGNOSIS — Y9301 Activity, walking, marching and hiking: Secondary | ICD-10-CM | POA: Insufficient documentation

## 2019-09-21 DIAGNOSIS — I10 Essential (primary) hypertension: Secondary | ICD-10-CM | POA: Insufficient documentation

## 2019-09-21 DIAGNOSIS — J449 Chronic obstructive pulmonary disease, unspecified: Secondary | ICD-10-CM | POA: Diagnosis not present

## 2019-09-21 DIAGNOSIS — Y92512 Supermarket, store or market as the place of occurrence of the external cause: Secondary | ICD-10-CM | POA: Insufficient documentation

## 2019-09-21 DIAGNOSIS — W010XXA Fall on same level from slipping, tripping and stumbling without subsequent striking against object, initial encounter: Secondary | ICD-10-CM | POA: Insufficient documentation

## 2019-09-21 DIAGNOSIS — Y998 Other external cause status: Secondary | ICD-10-CM | POA: Diagnosis not present

## 2019-09-21 DIAGNOSIS — S59901A Unspecified injury of right elbow, initial encounter: Secondary | ICD-10-CM | POA: Diagnosis present

## 2019-09-21 DIAGNOSIS — Z87891 Personal history of nicotine dependence: Secondary | ICD-10-CM | POA: Insufficient documentation

## 2019-09-21 DIAGNOSIS — Z79899 Other long term (current) drug therapy: Secondary | ICD-10-CM | POA: Insufficient documentation

## 2019-09-21 DIAGNOSIS — I2510 Atherosclerotic heart disease of native coronary artery without angina pectoris: Secondary | ICD-10-CM | POA: Insufficient documentation

## 2019-09-21 DIAGNOSIS — Z7982 Long term (current) use of aspirin: Secondary | ICD-10-CM | POA: Diagnosis not present

## 2019-09-21 DIAGNOSIS — G20 Parkinson's disease: Secondary | ICD-10-CM | POA: Diagnosis not present

## 2019-09-21 DIAGNOSIS — S42401A Unspecified fracture of lower end of right humerus, initial encounter for closed fracture: Secondary | ICD-10-CM | POA: Insufficient documentation

## 2019-09-21 DIAGNOSIS — S51011A Laceration without foreign body of right elbow, initial encounter: Secondary | ICD-10-CM

## 2019-09-21 DIAGNOSIS — Z8673 Personal history of transient ischemic attack (TIA), and cerebral infarction without residual deficits: Secondary | ICD-10-CM | POA: Insufficient documentation

## 2019-09-21 MED ORDER — LIDOCAINE VISCOUS HCL 2 % MT SOLN
15.0000 mL | Freq: Once | OROMUCOSAL | Status: DC
  Filled 2019-09-21: qty 15

## 2019-09-21 MED ORDER — MORPHINE SULFATE (PF) 4 MG/ML IV SOLN
4.0000 mg | Freq: Once | INTRAVENOUS | Status: AC
  Administered 2019-09-21: 4 mg via INTRAVENOUS
  Filled 2019-09-21: qty 1

## 2019-09-21 MED ORDER — ONDANSETRON HCL 4 MG/2ML IJ SOLN
4.0000 mg | Freq: Once | INTRAMUSCULAR | Status: AC
  Administered 2019-09-21: 4 mg via INTRAVENOUS
  Filled 2019-09-21: qty 2

## 2019-09-21 MED ORDER — NALOXONE HCL 0.4 MG/ML IJ SOLN: 0.4000 mg | Freq: Once | INTRAMUSCULAR | Status: DC

## 2019-09-21 MED ORDER — MIDAZOLAM HCL 5 MG/5ML IJ SOLN
10.0000 mg | Freq: Once | INTRAMUSCULAR | Status: DC
  Filled 2019-09-21: qty 10

## 2019-09-21 MED ORDER — FENTANYL CITRATE (PF) 100 MCG/2ML IJ SOLN
200.0000 ug | Freq: Once | INTRAMUSCULAR | Status: DC
  Filled 2019-09-21: qty 4

## 2019-09-21 MED ORDER — MIDAZOLAM HCL 5 MG/5ML IJ SOLN
INTRAMUSCULAR | Status: AC | PRN
  Administered 2019-09-21: 2 mg via INTRAVENOUS
  Administered 2019-09-21: 1 mg via INTRAVENOUS

## 2019-09-21 MED ORDER — FENTANYL CITRATE (PF) 100 MCG/2ML IJ SOLN
INTRAMUSCULAR | Status: AC | PRN
  Administered 2019-09-21: 10 ug via INTRAVENOUS

## 2019-09-21 NOTE — Discharge Instructions (Addendum)
[facility] trauma [facility] should call you up tomorrow to set up an appointment to see you on Monday.  Please wear the splint keep your arm elevated is much as possible.  Use ice 20 minutes every hour or so to the fractured area.  Be sure that ice is wrapped up in a good towel.  You do not want to get frostbite.  Do not fall asleep with the ice on it.  Use the Percocet 1 pill 4 times a day as needed for pain.  Be very careful can make you very sleepy and woozy.  Do not fall.  It can also make you constipated.  Please return here for increasing pain or numbness of the fingers.  This can be a sign of compartment syndrome where swelling the cuts off the blood flow to the rest of the arm.  This can cause a crippling injury if it is not addressed quickly.

## 2019-09-21 NOTE — ED Triage Notes (Signed)
Patient brought by EMS. Reports she was shopping when she tripped on the sidewalk and fell. Denies dizziness or weakness prior to fall. Patient complaining of pain in right forearm. Patient reports laceration to right forearm. Dressing in place. Patient states she hit the side of her face when she fell. Denies LOC. Denies use of blood thinners.

## 2019-09-21 NOTE — ED Notes (Signed)
ED Provider at bedside. 

## 2019-09-21 NOTE — ED Notes (Signed)
Pt returned from procedure, alert and oriented x4 , even unlabored respirations , skin warm and dry , OCL splint present to right arm, sensation and movement to right hand fingers.

## 2019-09-21 NOTE — ED Provider Notes (Addendum)
[facility] Department Provider Note   ____________________________________________   First MD Initiated Contact with Patient 09/21/19 1544     (approximate)  I have reviewed the triage vital signs and the nursing notes.   HISTORY  Chief Complaint Fall    HPI [name] is a 73 y.o. female who has parkinsonism who falls easily.  She was at the store tripped and fell did not hit her head she says she has a lot of pain in her elbow and a silver dollar sized skin tear on her forearm dorsal surface.  This the only pain she has there is in the elbow.        Past Medical History:  Diagnosis Date   Arthritis    Asthma    Crohn's disease ([place])    Emphysema of lung ([place])    GERD (gastroesophageal reflux disease)    Hip fracture ([place]) 01/26/2017   History of chicken pox    History of heart attack    Hx of completed stroke    Hypercholesteremia    Hypertension    Parkinson disease ([place])    Pneumonia    Seasonal allergies    Sepsis ([place])    Septic shock ([place]) 10/08/2016   UTI (lower urinary tract infection)     Patient Active Problem List   Diagnosis Date Noted   Crohn's disease of colon without complication ([place]) 17/79/3903   Hearing loss 06/01/2019   Vertigo 06/01/2019   Hypokalemia 07/22/2018   Oral thrush 07/22/2018   Gastroenteritis 07/16/2018   Primary Parkinsonism ([place]) 01/05/2018   SIADH (syndrome of inappropriate ADH production) ([place]) 10/01/2017   Spinal stenosis 10/01/2017   Elevated troponin I level 11/18/2016   COPD (chronic obstructive pulmonary disease) with chronic bronchitis ([place]) 08/12/2016   Asthma without status asthmaticus 03/31/2016   Osteoporosis 03/31/2016   Fatty liver determined by biopsy 09/17/2015   Parotid swelling 09/13/2015   Urticaria 09/13/2015   History of CVA (cerebrovascular accident) 08/21/2015   Senile purpura ([place]) 08/21/2015   Atrial tachycardia ([place])  08/13/2015   Eczema 07/10/2015   Allergic rhinitis 06/26/2015   Anxiety 04/23/2015   Edema leg 03/27/2015   Generalized weakness 03/06/2015   Primary cardiomyopathy ([place]) 03/05/2015   CAD (coronary artery disease) 03/05/2015   Benign essential HTN 02/28/2015   Combined fat and carbohydrate induced hyperlipemia 09/06/2014   Acid reflux 08/01/2014    Past Surgical History:  Procedure Laterality Date   BREAST BIOPSY Bilateral 1960's to 90's   breast cysts     CATARACT EXTRACTION, BILATERAL     right wrist fracture     TUBAL LIGATION      Prior to Admission medications   Medication Sig Start Date End Date Taking? Authorizing Provider  albuterol (ACCUNEB) 0.63 MG/3ML nebulizer solution Take 1 ampule by nebulization every 6 (six) hours as needed for wheezing.    [provider]  albuterol (VENTOLIN HFA) 108 (90 Base) MCG/ACT inhaler INHALE 2 PUFFS INTO THE LUNGS EVERY 6 HOURS AS NEEDED FOR WHEEZING ORSHORTNESS OF BREATH 06/13/19   Kendell Bane, NP  alendronate (FOSAMAX) 70 MG tablet One tab every Sunday on empty stomach with a full glass of water. Do not lie down or eat/drink anything else for the next 30 min. 12/23/18   [provider]  aspirin 81 MG tablet Take 81 mg by mouth daily.    [provider]  atorvastatin (LIPITOR) 40 MG tablet TAKE 1 TABLET BY MOUTH AT BEDTIME **  STOPSIMVASTATIN** 09/04/19   Bacigalupo, Dionne Bucy, MD  betamethasone valerate (VALISONE) 0.1 % cream APPLY TOPICALLY TO AFFECTED AREA SPARINGLY 2 TIMES DAILY. 07/10/15   Margarita Rana, MD  BREO ELLIPTA 100-25 MCG/INH AEPB INHALE 1 PUFF BY MOUTH ONCE DAILY AS DIRECTED 03/17/19   Kendell Bane, NP  carbidopa-levodopa (SINEMET CR) 50-200 MG tablet Take 1 tablet by mouth daily. 07/08/18   [provider]  carbidopa-levodopa (SINEMET IR) 25-100 MG tablet Take 2 tablets by mouth 3 (three) times daily.  12/13/17 03/28/19  [provider]  cetirizine (ZYRTEC) 10 MG  tablet Take 1 tablet (10 mg total) by mouth daily. 06/22/19   Kendell Bane, NP  Cholecalciferol (VITAMIN D) 2000 UNITS CAPS Take 1 capsule by mouth daily.    [provider]  citalopram (CELEXA) 20 MG tablet Take 1 tablet (20 mg total) by mouth daily. 08/24/18   Virginia Crews, MD  fluticasone (FLONASE) 50 MCG/ACT nasal spray PLACE TO SPRAYS INTO BOTHS NOSTRILS DAILY 01/18/19   Carles Collet M, PA-C  lisinopril (ZESTRIL) 10 MG tablet Take by mouth. 05/11/19 05/10/20  [provider]  montelukast (SINGULAIR) 10 MG tablet TAKE 1 TABLET BY MOUTH ONCE DAILY 08/03/19   Bacigalupo, Dionne Bucy, MD  nystatin (MYCOSTATIN) 100000 UNIT/ML suspension Take 5 mLs (500,000 Units total) by mouth 4 (four) times daily. Until resolution 07/22/18   Virginia Crews, MD  omeprazole (PRILOSEC) 40 MG capsule Take 40 mg by mouth daily.  12/28/18   [provider]  ondansetron (ZOFRAN) 4 MG tablet Take 1 tablet (4 mg total) by mouth every 8 (eight) hours as needed for nausea or vomiting. 03/28/19   Trinna Post, PA-C  potassium chloride SA (K-DUR) 20 MEQ tablet TAKE 1 TABLET BY MOUTH ONCE DAILY 07/26/19   Trinna Post, PA-C  SPIRIVA RESPIMAT 2.5 MCG/ACT AERS INHALE 2 PUFFS ONCE DAILY (TO REPLACE THE HANDIHALER) 12/19/18   Ronnell Freshwater, NP    Allergies Levaquin [levofloxacin] and Vicodin [hydrocodone-acetaminophen]  Family History  Problem Relation Age of Onset   Cancer Sister        ovarian/uterine/breast   Breast cancer Sister 39   Cancer Sister        breast   Heart disease Sister    Breast cancer Sister 58   Heart disease Father    Cancer Father        liver   Pancreatic cancer Mother    Heart disease Brother    Heart attack Brother    Cancer Brother        liver   Heart disease Sister    Diabetes Maternal Grandmother     Social History Social History   Tobacco Use   Smoking status: Former Smoker    Packs/day: 1.00    Years: 30.00    Pack  years: 30.00    Types: Cigarettes    Quit date: 08/01/2008    Years since quitting: 11.1   Smokeless tobacco: Never Used  Substance Use Topics   Alcohol use: Never    Frequency: Never   Drug use: No    Review of Systems  Constitutional: No fever/chills Eyes: No visual changes. ENT: No sore throat. Cardiovascular: Denies chest pain. Respiratory: Denies shortness of breath. Gastrointestinal: No abdominal pain.  No nausea, no vomiting.  No diarrhea.  No constipation. Genitourinary: Negative for dysuria. Musculoskeletal: Negative for back pain. Skin: Negative for rash. Neurological: Negative for headaches, focal weakness    ____________________________________________   PHYSICAL  EXAM:  VITAL SIGNS: ED Triage Vitals  Enc Vitals Group     BP 09/21/19 1423 97/60     Pulse Rate 09/21/19 1423 89     Resp 09/21/19 1423 16     Temp 09/21/19 1423 98.4 F (36.9 C)     Temp Source 09/21/19 1423 Oral     SpO2 09/21/19 1423 96 %     Weight 09/21/19 1437 145 lb (65.8 kg)     Height 09/21/19 1437 5' 1"  (1.549 m)     Head Circumference --      Peak Flow --      Pain Score 09/21/19 1437 10     Pain Loc --      Pain Edu? --      Excl. in [place]? --     Constitutional: Alert and oriented. Well appearing and in no acute distress. Eyes: Conjunctivae are normal.  Head: Atraumatic. Nose: No congestion/rhinnorhea. Mouth/Throat: Mucous membranes are moist.  Oropharynx non-erythematous. Neck: No stridor. Cardiovascular: Normal rate, regular rhythm. Grossly normal heart sounds.  Good peripheral circulation. Respiratory: Normal respiratory effort.  No retractions. Lungs CTAB. Gastrointestinal: Soft and nontender. No distention. No abdominal bruits. No CVA tenderness. Musculoskeletal: No lower extremity tenderness nor edema.  Right elbow.  Tender somewhat swollen Neurologic:  Normal speech and language. No gross focal neurologic deficits are appreciated. No gait instability. Skin:  Skin is  warm, dry and intact for skin tear on the dorsal forearm. No rash noted. Psychiatric: Mood and affect are normal. Speech and behavior are normal.  ____________________________________________   LABS (all labs ordered are listed, but only abnormal results are displayed)  Labs Reviewed - No data to display ____________________________________________  EKG   ____________________________________________  RADIOLOGY  ED MD interpretation: X-ray read by radiology reviewed by me shows a fracture of the radius and ulna dislocation of the radius.  Official radiology report(s): Dg Elbow 2 Views Right  Result Date: 09/21/2019 CLINICAL DATA:  Postreduction EXAM: RIGHT ELBOW - 2 VIEW COMPARISON:  09/21/2019 FINDINGS: Again noted is the comminuted fracture through the proximal right ulnar shaft with displacement and angulation of fracture fragments. No significant change since prior study. Interval reduction of the previously seen dislocated radial head. Radial head fracture noted. Overlying soft tissue swelling. IMPRESSION: Comminuted proximal ulnar fracture with centrally stable displacement and angulation of fracture fragments. Interval reduction of the previously seen dislocated radial head. Radial head fracture noted. Electronically Signed   By: Rolm Baptise M.D.   On: 09/21/2019 19:05   Dg Elbow Complete Right  Result Date: 09/21/2019 CLINICAL DATA:  Fall, arm pain EXAM: RIGHT ELBOW - COMPLETE 3+ VIEW COMPARISON:  Forearm radiographs 11/12/2009, same-day forearm radiographs as well. FINDINGS: There is a comminuted fracture of the proximal ulnar metadiaphysis with dorsal apical angulation. There is posterior dislocation of the radius with a displaced fracture of the anterior radial head and a mildly impacted fracture of the radial neck. There is circumferential soft tissue swelling of the elbow most pronounced over the olecranon and medial soft tissues. Associated elbow joint effusion is noted.  Background of mild degenerative arthrosis at the elbow joint. IMPRESSION: 1. Comminuted fracture of the proximal ulnar metadiaphysis with dorsal apical angulation. 2. Posterior dislocation of the radius with a displaced fracture of the anterior radial head and mildly impacted fracture of the radial neck. Electronically Signed   By: Lovena Le M.D.   On: 09/21/2019 15:28   Dg Forearm Right  Result Date: 09/21/2019 CLINICAL DATA:  Fall with arm pain EXAM: RIGHT FOREARM - 2 VIEW COMPARISON:  Wrist radiograph 12/06/2009, forearm radiograph 11/12/2009 FINDINGS: There is a comminuted fracture of the proximal ulnar metadiaphysis with slight dorsal apical angulation. Additionally there is dorsal dislocation of the proximal radius with impacted and slightly angulated fracture of the radial head and neck. There is associated soft tissue swelling and an elbow joint effusion better assessed on dedicated elbow radiographs. Distal radius is transfixed by a plate and screw fixation construct. Alignment at the wrist is grossly maintained on these nondedicated radiographs. IMPRESSION: 1. Comminuted fracture of the proximal ulnar metadiaphysis with slight dorsal apical angulation. 2. Dorsal dislocation of the proximal radius with impacted and slightly angulated fracture of the radial head and neck. 3. Associated swelling and elbow joint effusion. Electronically Signed   By: Lovena Le M.D.   On: 09/21/2019 15:26   Ct Head Wo Contrast  Result Date: 09/21/2019 CLINICAL DATA:  Head trauma, fall EXAM: CT HEAD WITHOUT CONTRAST TECHNIQUE: Contiguous axial images were obtained from the base of the skull through the vertex without intravenous contrast. COMPARISON:  CT brain 07/16/2018 FINDINGS: Brain: No acute territorial infarction, hemorrhage, or intracranial mass. Atrophy and moderate small vessel ischemic changes of the white matter. Chronic infarct within the left basal ganglia and white matter. Stable ventricle size  Vascular: No hyperdense vessels. Carotid vascular calcification and vertebral calcification Skull: Normal. Negative for fracture or focal lesion. Sinuses/Orbits: No acute finding. Other: None IMPRESSION: 1. No CT evidence for acute intracranial abnormality. 2. Atrophy and small vessel ischemic changes of the white matter. Chronic left basal ganglia and white matter infarct Electronically Signed   By: Donavan Foil M.D.   On: 09/21/2019 15:29    ____________________________________________   PROCEDURES  Procedure(s) performed (including Critical Care): Discussed patient with Dr. Posey Pronto he wants me to try to reduce the radius fracture.  The patient gave her consent we sedated her with fentanyl and Versed and linear traction was placed on the elbow once it was straightened out put my thumb on the head of the radius and pushed up it seemed to reduce.  X-ray confirmed reduction.  I do not currently have access for some reason to the sedation flowsheet however patient was at low risk for sedation had not eaten since 11:00.  There were no complications although the patient did have a little bit of a low blood pressure before we started with a systolic of 91 this came up nicely with normal saline so we continued with the sedation.. Critical care time 45 minutes including discussing the patient with the hospitalist and Dr. Posey Pronto discussing the patient with her sister and the patient herself together and doing the reduction. .Sedation  Date/Time: 09/28/2019 9:53 PM Performed by: Nena Polio, MD Authorized by: Nena Polio, MD   Consent:    Consent obtained:  Written   Consent given by:  Patient   Risks discussed:  Allergic reaction, inadequate sedation, nausea, prolonged hypoxia resulting in organ damage, prolonged sedation necessitating reversal, respiratory compromise necessitating ventilatory assistance and intubation and vomiting   Alternatives discussed:  Anxiolysis Universal protocol:     Procedure explained and questions answered to patient or proxy's satisfaction: yes     Relevant documents present and verified: yes     Test results available and properly labeled: yes     Imaging studies available: yes     Required blood products, implants, devices, and special equipment available: yes     Site/side marked: yes  Immediately prior to procedure a time out was called: yes   Indications:    Procedure performed:  Dislocation reduction   Procedure necessitating sedation performed by:  Physician performing sedation Pre-sedation assessment:    Time since last food or drink:  Multiple hours   NPO status caution: urgency dictates proceeding with non-ideal NPO status     ASA classification: class 2 - patient with mild systemic disease     Neck mobility: normal     Mouth opening:  3 or more finger widths   Thyromental distance:  3 finger widths   Mallampati score:  III - soft palate, base of uvula visible   Pre-sedation assessments completed and reviewed: airway patency, cardiovascular function, hydration status, mental status, nausea/vomiting, pain level, respiratory function and temperature     Pre-sedation assessment completed:  09/21/2019 4:35 PM Immediate pre-procedure details:    Reassessment: Patient reassessed immediately prior to procedure     Reviewed: vital signs and NPO status     Verified: bag valve mask available, emergency equipment available, intubation equipment available, IV patency confirmed, oxygen available, reversal medications available and suction available   Procedure details (see MAR for exact dosages):    Preoxygenation:  Nasal cannula   Sedation:  Midazolam   Intended level of sedation: moderate (conscious sedation)   Analgesia:  Fentanyl   Intra-procedure monitoring:  Blood pressure monitoring, cardiac monitor, continuous capnometry, continuous pulse oximetry, frequent LOC assessments and frequent vital sign checks   Intra-procedure events: none      Total Provider sedation time (minutes):  9 Post-procedure details:    Post-sedation assessment completed:  09/21/2019 9:55 PM   Attendance: Constant attendance by certified staff until patient recovered     Recovery: Patient returned to pre-procedure baseline     Post-sedation assessments completed and reviewed: airway patency, cardiovascular function, hydration status, mental status, nausea/vomiting, pain level, respiratory function and temperature     Patient is stable for discharge or admission: yes     Patient tolerance:  Tolerated well, no immediate complications    .procdoc ____________________________________________   INITIAL IMPRESSION / ASSESSMENT AND PLAN / ED COURSE  Discussed patient with Dr. Posey Pronto twice.  Second time he reports that he discussed the patient himself with [facility] trauma.  They will call her Friday to set up an appointment time for her to go to be looked at on Monday.            ____________________________________________   FINAL CLINICAL IMPRESSION(S) / ED DIAGNOSES  Final diagnoses:  Skin tear of right elbow without complication, initial encounter  Closed fracture dislocation of right elbow, initial encounter     ED Discharge Orders    None       Note:  This document was prepared using Dragon voice recognition software and may include unintentional dictation errors.    Nena Polio, MD 09/21/19 2244    Nena Polio, MD 09/21/19 2246    Nena Polio, MD 09/28/19 2156

## 2019-09-24 DIAGNOSIS — S52271A Monteggia's fracture of right ulna, initial encounter for closed fracture: Secondary | ICD-10-CM | POA: Insufficient documentation

## 2019-09-29 ENCOUNTER — Other Ambulatory Visit: Payer: Self-pay | Admitting: Adult Health

## 2019-09-29 ENCOUNTER — Other Ambulatory Visit: Payer: Self-pay | Admitting: Physician Assistant

## 2019-10-02 ENCOUNTER — Other Ambulatory Visit: Payer: Self-pay | Admitting: Internal Medicine

## 2019-10-02 MED ORDER — SPIRIVA RESPIMAT 2.5 MCG/ACT IN AERS: INHALATION_SPRAY | RESPIRATORY_TRACT | 1 refills | Status: DC

## 2019-10-25 ENCOUNTER — Encounter: Payer: Self-pay | Admitting: Emergency Medicine

## 2019-10-25 ENCOUNTER — Emergency Department: Payer: Medicare Other

## 2019-10-25 ENCOUNTER — Other Ambulatory Visit: Payer: Self-pay

## 2019-10-25 ENCOUNTER — Emergency Department
Admission: EM | Admit: 2019-10-25 | Discharge: 2019-10-25 | Disposition: A | Discharge status: Home / Self Care | Payer: Medicare Other | Attending: Emergency Medicine | Admitting: Emergency Medicine

## 2019-10-25 DIAGNOSIS — S0219XA Other fracture of base of skull, initial encounter for closed fracture: Secondary | ICD-10-CM | POA: Diagnosis not present

## 2019-10-25 DIAGNOSIS — S2242XA Multiple fractures of ribs, left side, initial encounter for closed fracture: Secondary | ICD-10-CM | POA: Diagnosis not present

## 2019-10-25 DIAGNOSIS — J45909 Unspecified asthma, uncomplicated: Secondary | ICD-10-CM | POA: Diagnosis not present

## 2019-10-25 DIAGNOSIS — J449 Chronic obstructive pulmonary disease, unspecified: Secondary | ICD-10-CM | POA: Diagnosis not present

## 2019-10-25 DIAGNOSIS — Z79899 Other long term (current) drug therapy: Secondary | ICD-10-CM | POA: Diagnosis not present

## 2019-10-25 DIAGNOSIS — Q7192 Unspecified reduction defect of left upper limb: Secondary | ICD-10-CM

## 2019-10-25 DIAGNOSIS — Z7982 Long term (current) use of aspirin: Secondary | ICD-10-CM | POA: Insufficient documentation

## 2019-10-25 DIAGNOSIS — Y929 Unspecified place or not applicable: Secondary | ICD-10-CM | POA: Insufficient documentation

## 2019-10-25 DIAGNOSIS — S6992XA Unspecified injury of left wrist, hand and finger(s), initial encounter: Secondary | ICD-10-CM | POA: Diagnosis present

## 2019-10-25 DIAGNOSIS — Z87891 Personal history of nicotine dependence: Secondary | ICD-10-CM | POA: Diagnosis not present

## 2019-10-25 DIAGNOSIS — I10 Essential (primary) hypertension: Secondary | ICD-10-CM | POA: Insufficient documentation

## 2019-10-25 DIAGNOSIS — S62102A Fracture of unspecified carpal bone, left wrist, initial encounter for closed fracture: Secondary | ICD-10-CM | POA: Insufficient documentation

## 2019-10-25 DIAGNOSIS — Y939 Activity, unspecified: Secondary | ICD-10-CM | POA: Insufficient documentation

## 2019-10-25 DIAGNOSIS — S0083XA Contusion of other part of head, initial encounter: Secondary | ICD-10-CM | POA: Insufficient documentation

## 2019-10-25 DIAGNOSIS — Y999 Unspecified external cause status: Secondary | ICD-10-CM | POA: Insufficient documentation

## 2019-10-25 DIAGNOSIS — W010XXA Fall on same level from slipping, tripping and stumbling without subsequent striking against object, initial encounter: Secondary | ICD-10-CM | POA: Insufficient documentation

## 2019-10-25 MED ORDER — LIDOCAINE HCL (PF) 1 % IJ SOLN
5.0000 mL | Freq: Once | INTRAMUSCULAR | Status: AC
  Administered 2019-10-25: 5 mL
  Filled 2019-10-25: qty 5

## 2019-10-25 MED ORDER — CEPHALEXIN 500 MG PO CAPS
500.0000 mg | ORAL_CAPSULE | Freq: Once | ORAL | Status: AC
  Administered 2019-10-25: 500 mg via ORAL
  Filled 2019-10-25: qty 1

## 2019-10-25 MED ORDER — ONDANSETRON 4 MG PO TBDP
4.0000 mg | ORAL_TABLET | Freq: Once | ORAL | Status: AC
  Administered 2019-10-25: 4 mg via ORAL
  Filled 2019-10-25: qty 1

## 2019-10-25 MED ORDER — ONDANSETRON 4 MG PO TBDP: 4.0000 mg | ORAL_TABLET | Freq: Three times a day (TID) | ORAL | 0 refills | Status: DC | PRN

## 2019-10-25 MED ORDER — LIDOCAINE 5 % EX PTCH: 1.0000 | MEDICATED_PATCH | CUTANEOUS | 0 refills | Status: AC

## 2019-10-25 MED ORDER — HYDROCODONE-ACETAMINOPHEN 5-325 MG PO TABS
1.0000 | ORAL_TABLET | Freq: Once | ORAL | Status: AC
  Administered 2019-10-25: 1 via ORAL
  Filled 2019-10-25: qty 1

## 2019-10-25 MED ORDER — BUPIVACAINE HCL (PF) 0.5 % IJ SOLN
10.0000 mL | Freq: Once | INTRAMUSCULAR | Status: AC
  Administered 2019-10-25: 10 mL
  Filled 2019-10-25: qty 10

## 2019-10-25 MED ORDER — BUPIVACAINE HCL 0.5 % IJ SOLN
10.0000 mL | Freq: Once | INTRAMUSCULAR | Status: DC
  Filled 2019-10-25: qty 10

## 2019-10-25 MED ORDER — OXYCODONE HCL 5 MG PO TABS
5.0000 mg | ORAL_TABLET | Freq: Once | ORAL | Status: AC
  Administered 2019-10-25: 5 mg via ORAL
  Filled 2019-10-25: qty 1

## 2019-10-25 MED ORDER — HYDROCODONE-ACETAMINOPHEN 5-325 MG PO TABS: 1.0000 | ORAL_TABLET | Freq: Three times a day (TID) | ORAL | 0 refills | Status: AC | PRN

## 2019-10-25 MED ORDER — CEPHALEXIN 500 MG PO CAPS: 500.0000 mg | ORAL_CAPSULE | Freq: Three times a day (TID) | ORAL | 0 refills | Status: AC

## 2019-10-25 NOTE — Discharge Instructions (Signed)
Take the antibiotic as directed. Take the pain medicine as needed. Rest with the head elevated and avoid blowing your nose for the next 2 weeks. You are being treated for a wrist fracture, rib (2) fractures, and non-displaced sinus bone fractures. Follow-up with Dr. Marry Guan for further management.

## 2019-10-25 NOTE — ED Provider Notes (Signed)
[facility] Emergency Department Provider Note ____________________________________________  Time seen: 1858  I have reviewed the triage vital signs and the nursing notes.  HISTORY  Chief Complaint  Fall  HPI Lori Brady is a 73 y.o. female with a history of asthma, arthritis, Crohn's disease, hypertension, and Parkinson's, presents to the ED via EMS, from a mechanical fall.  Patient apparently sustained Left wrist pain and deformity after tripping over her walker just prior to arrival. Her sister is her roommate and was present at home when the accident happened. Her medical history is significant in that she fell a month prior, and had a right elbow fracture that was recently repaired with an ORIF.  Patient had that cast removed 2 days prior.  Patient complains of rib pain and facial pain as well. She denies any LOC, nausea, vomiting, chest pain or SOB.   Past Medical History:  Diagnosis Date  . Arthritis   . Asthma   . Crohn's disease (HCC)   . Emphysema of lung (HCC)   . GERD (gastroesophageal reflux disease)   . Hip fracture (HCC) 01/26/2017  . History of chicken pox   . History of heart attack   . Hx of completed stroke   . Hypercholesteremia   . Hypertension   . Parkinson disease (HCC)   . Pneumonia   . Seasonal allergies   . Sepsis (HCC)   . Septic shock (HCC) 10/08/2016  . UTI (lower urinary tract infection)     Patient Active Problem List   Diagnosis Date Noted  . Crohn's disease of colon without complication (HCC) 09/05/2019  . Hearing loss 06/01/2019  . Vertigo 06/01/2019  . Hypokalemia 07/22/2018  . Oral thrush 07/22/2018  . Gastroenteritis 07/16/2018  . Primary Parkinsonism (HCC) 01/05/2018  . SIADH (syndrome of inappropriate ADH production) (HCC) 10/01/2017  . Spinal stenosis 10/01/2017  . Elevated troponin I level 11/18/2016  . COPD (chronic obstructive pulmonary disease) with chronic bronchitis (HCC) 08/12/2016  . Asthma without  status asthmaticus 03/31/2016  . Osteoporosis 03/31/2016  . Fatty liver determined by biopsy 09/17/2015  . Parotid swelling 09/13/2015  . Urticaria 09/13/2015  . History of CVA (cerebrovascular accident) 08/21/2015  . Senile purpura (HCC) 08/21/2015  . Atrial tachycardia (HCC) 08/13/2015  . Eczema 07/10/2015  . Allergic rhinitis 06/26/2015  . Anxiety 04/23/2015  . Edema leg 03/27/2015  . Generalized weakness 03/06/2015  . Primary cardiomyopathy (HCC) 03/05/2015  . CAD (coronary artery disease) 03/05/2015  . Benign essential HTN 02/28/2015  . Combined fat and carbohydrate induced hyperlipemia 09/06/2014  . Acid reflux 08/01/2014    Past Surgical History:  Procedure Laterality Date  . BREAST BIOPSY Bilateral 1960's to 90's  . breast cysts    . CATARACT EXTRACTION, BILATERAL    . right wrist fracture    . TUBAL LIGATION      Prior to Admission medications   Medication Sig Start Date End Date Taking? Authorizing Provider  albuterol (ACCUNEB) 0.63 MG/3ML nebulizer solution Take 1 ampule by nebulization every 6 (six) hours as needed for wheezing.    [provider]  albuterol (VENTOLIN HFA) 108 (90 Base) MCG/ACT inhaler INHALE 2 PUFFS INTO THE LUNGS EVERY 6 HOURS AS NEEDED FOR WHEEZING ORSHORTNESS OF BREATH 06/13/19   Johnna Acosta, NP  alendronate (FOSAMAX) 70 MG tablet One tab every Sunday on empty stomach with a full glass of water. Do not lie down or eat/drink anything else for the next 30 min. 12/23/18   [provider]  aspirin 81 MG tablet Take 81 mg by mouth daily.    [provider]  atorvastatin (LIPITOR) 40 MG tablet TAKE 1 TABLET BY MOUTH AT BEDTIME **STOPSIMVASTATIN** 09/04/19   Bacigalupo, Dionne Bucy, MD  betamethasone valerate (VALISONE) 0.1 % cream APPLY TOPICALLY TO AFFECTED AREA SPARINGLY 2 TIMES DAILY. 07/10/15   Margarita Rana, MD  BREO ELLIPTA 100-25 MCG/INH AEPB INHALE 1 PUFF BY MOUTH ONCE DAILY AS DIRECTED 09/29/19   Lavera Guise, MD   carbidopa-levodopa (SINEMET CR) 50-200 MG tablet Take 1 tablet by mouth daily. 07/08/18   [provider]  carbidopa-levodopa (SINEMET IR) 25-100 MG tablet Take 2 tablets by mouth 3 (three) times daily.  12/13/17 03/28/19  [provider]  cephALEXin (KEFLEX) 500 MG capsule Take 1 capsule (500 mg total) by mouth 3 (three) times daily for 7 days. 10/26/19 11/02/19  Kayon Dozier, Dannielle Karvonen, PA-C  cetirizine (ZYRTEC) 10 MG tablet Take 1 tablet (10 mg total) by mouth daily. 06/22/19   Kendell Bane, NP  Cholecalciferol (VITAMIN D) 2000 UNITS CAPS Take 1 capsule by mouth daily.    [provider]  citalopram (CELEXA) 20 MG tablet Take 1 tablet (20 mg total) by mouth daily. 08/24/18   Virginia Crews, MD  fluticasone (FLONASE) 50 MCG/ACT nasal spray PLACE TO SPRAYS INTO BOTHS NOSTRILS DAILY 10/02/19   Bacigalupo, Dionne Bucy, MD  HYDROcodone-acetaminophen (NORCO) 5-325 MG tablet Take 1 tablet by mouth 3 (three) times daily as needed for up to 5 days. 10/25/19 10/30/19  Haden Cavenaugh, Dannielle Karvonen, PA-C  lisinopril (ZESTRIL) 10 MG tablet Take by mouth. 05/11/19 05/10/20  [provider]  montelukast (SINGULAIR) 10 MG tablet TAKE 1 TABLET BY MOUTH ONCE DAILY 08/03/19   Bacigalupo, Dionne Bucy, MD  nystatin (MYCOSTATIN) 100000 UNIT/ML suspension Take 5 mLs (500,000 Units total) by mouth 4 (four) times daily. Until resolution 07/22/18   Virginia Crews, MD  omeprazole (PRILOSEC) 40 MG capsule Take 40 mg by mouth daily.  12/28/18   [provider]  ondansetron (ZOFRAN ODT) 4 MG disintegrating tablet Take 1 tablet (4 mg total) by mouth every 8 (eight) hours as needed. 10/25/19   Maikol Grassia, Dannielle Karvonen, PA-C  ondansetron (ZOFRAN) 4 MG tablet Take 1 tablet (4 mg total) by mouth every 8 (eight) hours as needed for nausea or vomiting. 03/28/19   Trinna Post, PA-C  potassium chloride SA (K-DUR) 20 MEQ tablet TAKE 1 TABLET BY MOUTH ONCE DAILY 07/26/19   Trinna Post, PA-C   Tiotropium Bromide Monohydrate (SPIRIVA RESPIMAT) 2.5 MCG/ACT AERS Use 2 puffs daily for copd 10/02/19   Lavera Guise, MD    Allergies Levaquin [levofloxacin] and Vicodin [hydrocodone-acetaminophen]  Family History  Problem Relation Age of Onset  . Cancer Sister        ovarian/uterine/breast  . Breast cancer Sister 45  . Cancer Sister        breast  . Heart disease Sister   . Breast cancer Sister 44  . Heart disease Father   . Cancer Father        liver  . Pancreatic cancer Mother   . Heart disease Brother   . Heart attack Brother   . Cancer Brother        liver  . Heart disease Sister   . Diabetes Maternal Grandmother     Social History Social History   Tobacco Use  . Smoking status: Former Smoker    Packs/day: 1.00  Years: 30.00    Pack years: 30.00    Types: Cigarettes    Quit date: 08/01/2008    Years since quitting: 11.2  . Smokeless tobacco: Never Used  Substance Use Topics  . Alcohol use: Never    Frequency: Never  . Drug use: No    Review of Systems  Constitutional: Negative for fever. Eyes: Negative for visual changes. ENT: Negative for sore throat. Cardiovascular: Negative for chest pain. Respiratory: Negative for shortness of breath. Left rib pain as above Gastrointestinal: Negative for abdominal pain, vomiting and diarrhea. Genitourinary: Negative for dysuria. Musculoskeletal: Negative for back pain.  Left wrist pain and deformity as above. Skin: Negative for rash. Neurological: Negative for headaches, focal weakness or numbness. ____________________________________________  PHYSICAL EXAM:  VITAL SIGNS: ED Triage Vitals  Enc Vitals Group     BP 10/25/19 1822 (!) 151/65     Pulse Rate 10/25/19 1822 88     Resp 10/25/19 1822 16     Temp 10/25/19 1822 98.7 F (37.1 C)     Temp Source 10/25/19 1822 Oral     SpO2 10/25/19 1822 98 %     Weight 10/25/19 1825 145 lb 1 oz (65.8 kg)     Height 10/25/19 1825 5\' 4"  (1.626 m)     Head  Circumference --      Peak Flow --      Pain Score 10/25/19 1824 9     Pain Loc --      Pain Edu? --      Excl. in GC? --     Constitutional: Alert and oriented. Well appearing and in no distress. GCS = 15 Head: Normocephalic and atraumatic, except for moderate soft tissue swelling to the left cheek and infraorbital region. Blunting of the left nasolabial fold noted. Eyes: Conjunctivae are normal. PERRL. Normal extraocular movements Ears: Canals clear. TMs intact bilaterally. Nose: No congestion/rhinorrhea/epistaxis. No deformity or nasal septal hematoma.  Mouth/Throat: Mucous membranes are moist. Neck: Supple. Normal ROM. No midline tenderness noted.  Cardiovascular: Normal rate, regular rhythm. Normal distal pulses. Respiratory: Normal respiratory effort. No wheezes/rales/rhonchi. Tender over the left anterolateral chest wall Gastrointestinal: Soft and nontender. No distention. Musculoskeletal: Left wrist with dorsal angulation noted. Normal composite fist noted. Right elbow with posterior brace in place. Nontender with normal range of motiCN II-XII grossly intact. Normal speech and language. No gross focal neurologic deficits are appreciated. Skin:  Skin is warm, dry and intact. No rash noted. Psychiatric: Mood and affect are normal. Patient exhibits appropriate insight and judgment. ___________________________________________   RADIOLOGY  DG Left Wrist   DG Left Wrist 2V   DG Left Ribs w/ CXR   CT Head/Maxillofacial IMPRESSION: 1. Nondisplaced fracture involving the posterior wall of the left maxillary sinus. Probable nondisplaced fracture involving the anterior wall of the left maxillary sinus. 2. Left facial soft tissue swelling and small left facial hematoma. 3. No evidence for acute intracranial process. ____________________________________________  PROCEDURES  Zofran 4 mg ODT Norco 5-325 mg PO Oxycodone IR 5 mg PO Keflex 500 mg PO  Reduction of  fracture  Date/Time: 10/25/2019 7:33 PM Performed by: Lissa Hoard, PA-C Authorized by: Lissa Hoard, PA-C  Consent: Verbal consent obtained. Consent given by: patient Patient understanding: patient states understanding of the procedure being performed Patient consent: the patient's understanding of the procedure matches consent given Procedure consent: procedure consent matches procedure scheduled Site marked: the operative site was marked Imaging studies: imaging studies available Patient identity confirmed:  verbally with patient Preparation: Patient was prepped and draped in the usual sterile fashion. Local anesthesia used: yes Anesthesia: hematoma block  Anesthesia: Local anesthesia used: yes Local Anesthetic: bupivacaine 0.5% without epinephrine and lidocaine 1% without epinephrine Anesthetic total: 7 mL  Sedation: Patient sedated: no  Patient tolerance: patient tolerated the procedure well with no immediate complications  .Splint Application  Date/Time: 10/25/2019 7:34 PM Performed by: Ainsley Spinner, NT Authorized by: Lissa Hoard, PA-C   Consent:    Consent obtained:  Verbal   Consent given by:  Patient   Risks discussed:  Pain Pre-procedure details:    Sensation:  Normal Procedure details:    Laterality:  Left   Location:  Wrist   Wrist:  L wrist   Splint type:  Sugar tong   Supplies:  Cotton padding, elastic bandage, Ortho-Glass and sling Post-procedure details:    Pain:  Improved   Sensation:  Normal   Patient tolerance of procedure:  Tolerated well, no immediate complications   ____________________________________________  INITIAL IMPRESSION / ASSESSMENT AND PLAN / ED COURSE  ----------------------------------------- 8:53 PM on 10/25/2019 ----------------------------------------- S/w Dr. Ernest Pine. He suggests reduction and splinting. He will see the patient in the office next week.   Patient with ED evaluation of  injury sustained following a mechanical fall.  Patient was evaluated for her acute injuries, and found to have nondisplaced nasal sinus fractures, 2 nondisplaced rib fractures, and a comminuted displaced left wrist fracture.  Complicating the patient's case is today that she was recently treated for a Monteggia's fracture to her right elbow.  Initial fracture care was provided and patient was prophylaxed with Keflex for the sinus fractures.  She is referred to Dr. Ernest Pine for further fracture care.  Splint is placed on the left wrist, and a social work consult is placed for outpatient management of DME and home health aide.  Patient is discharged to the care of her adult son at this time and questions were encouraged and answered.  Prescriptions for hydrocodone, Keflex, and Lidoderm patches are provided.  Lori Brady was evaluated in Emergency Department on 10/25/2019 for the symptoms described in the history of present illness. She was evaluated in the context of the global COVID-19 pandemic, which necessitated consideration that the patient might be at risk for infection with the SARS-CoV-2 virus that causes COVID-19. Institutional protocols and algorithms that pertain to the evaluation of patients at risk for COVID-19 are in a state of rapid change based on information released by regulatory bodies including the CDC and federal and state organizations. These policies and algorithms were followed during the patient's care in the ED. ____________________________________________  FINAL CLINICAL IMPRESSION(S) / ED DIAGNOSES  Final diagnoses:  Reduction defect of left upper extremity  Left wrist fracture, closed, initial encounter  Contusion of face, initial encounter  Closed fracture of multiple ribs of left side, initial encounter  Fracture of bone of nasal sinus (HCC)      Karmen Stabs, Charlesetta Ivory, PA-C 10/25/19 2317    Minna Antis, MD 11/03/19 1417

## 2019-10-25 NOTE — ED Triage Notes (Addendum)
ARrives via [facility].  S/P fall.  Tripped on walker, fell.  C?O left wrist pain.  Fell approximately 1 month ago and had a right elbow fracture with surgical repair-- cast removed 2 days ago.  Deformity to right wrist.  + radial pulse

## 2019-10-26 ENCOUNTER — Telehealth: Payer: Self-pay

## 2019-10-26 DIAGNOSIS — S2242XD Multiple fractures of ribs, left side, subsequent encounter for fracture with routine healing: Secondary | ICD-10-CM

## 2019-10-26 DIAGNOSIS — S62102D Fracture of unspecified carpal bone, left wrist, subsequent encounter for fracture with routine healing: Secondary | ICD-10-CM

## 2019-10-26 NOTE — Telephone Encounter (Signed)
Pt sister karen is calling back dr Jeneen Rinks [facility] ortho surgeon does not do wrist. Pt needs a referral . Pt fell and went to [facility] and was dx with left wrist fracture and needs ortho surgeon referral. Pt also has cracked ribs

## 2019-10-26 NOTE — Telephone Encounter (Signed)
Son Lori Brady states he [name] the aunt called this morning requesting assistance for the past 24/7.  Pt has injured (broken elbow last month) wrist last night, cracked ribs, displaced fracture left side nasal cavity. He states no one can stay with her, and the aunt not strong enough to get her up and down out of chair. Lori Brady states they need urgent help for someone to stay with her.  Would like assistance asap. Lori Brady  [phone]

## 2019-10-26 NOTE — Telephone Encounter (Signed)
Ok to place referral to [facility] instead.

## 2019-10-26 NOTE — Telephone Encounter (Signed)
From [facility] 

## 2019-10-26 NOTE — Addendum Note (Signed)
Addended by: Shawna Orleans on: 10/26/2019 11:55 AM   Modules accepted: Orders

## 2019-10-26 NOTE — Addendum Note (Signed)
Addended by: Virginia Crews on: 10/26/2019 04:38 PM   Modules accepted: Orders

## 2019-10-26 NOTE — Telephone Encounter (Signed)
Copied from [place] [id number]. Topic: General - Inquiry >> Oct 26, 2019  9:43 AM Virl Axe D wrote: Reason for CRM: Pt's sister [name] called to request Dr. B to help with getting a Education officer, museum for pt. She fell Monday and broke her left wrist/hand and two ribs. Please advise.

## 2019-10-26 NOTE — Telephone Encounter (Signed)
We could set up home health, but they do not stay 24/7.  They will come to the house and help for a few hours a week.  There are home health agencies that will allow you to personally hire an aide to be there more regularly, but this is not covered by insurance.  We could refer to CCM and see if personal care services would be an option, but typically that is for more chronic conditions.  A rehab facility temporarily would be another option, which we would need CCM to help with placement for as well.

## 2019-10-26 NOTE — Telephone Encounter (Signed)
Spoke with [name] (Pt's sister)  They would like to proceed with the CCM Referral.    Thanks,   -Mickel Baas

## 2019-10-26 NOTE — Addendum Note (Signed)
Addended by: Ashley Royalty E on: 10/26/2019 04:34 PM   Modules accepted: Orders

## 2019-10-27 ENCOUNTER — Telehealth: Payer: Self-pay | Admitting: Family Medicine

## 2019-10-27 DIAGNOSIS — S52502A Unspecified fracture of the lower end of left radius, initial encounter for closed fracture: Secondary | ICD-10-CM | POA: Insufficient documentation

## 2019-10-27 NOTE — Telephone Encounter (Signed)
Patient's sister call back, she states she has not heard anything from office. She is requesting a call back to discuss this and potentially having a medication sent into pharmacy for patient's broken ribs (She states she needs an antibiotic and cough syrup per orthopedic doctor at visit today)

## 2019-10-27 NOTE — Telephone Encounter (Signed)
From [facility] 

## 2019-10-27 NOTE — Chronic Care Management (AMB) (Signed)
  Chronic Care Management   Note  10/27/2019 Name: Lori Brady MRN: [id number] DOB: 16-May-1946  Lori Brady is a 73 y.o. year old female who is a primary care patient of Bacigalupo, Dionne Bucy, MD. I reached out to Lori Brady by phone today in response to a referral sent by Ms. [name] Hew Cheramie's PCP, Dr. [name]     Lori Brady was given information about Chronic Care Management services today including:  1. CCM service includes personalized support from designated clinical staff supervised by her physician, including individualized plan of care and coordination with other care providers 2. 24/7 contact phone numbers for assistance for urgent and routine care needs. 3. Service will only be billed when office clinical staff spend 20 minutes or more in a month to coordinate care. 4. Only one practitioner may furnish and bill the service in a calendar month. 5. The patient may stop CCM services at any time (effective at the end of the month) by phone call to the office staff. 6. The patient will be responsible for cost sharing (co-pay) of up to 20% of the service fee (after annual deductible is met).  Patient agreed to services and verbal consent obtained.   Follow up plan: Telephone appointment with CCM team member scheduled for:11/07/2019  Glenna Durand, LPN Health Advisor, [facility] ??[email or web address] ??[phone]

## 2019-10-27 NOTE — Telephone Encounter (Signed)
We can check on the status of the CCM referral.  She does not need an antibiotic for broken ribs.  Her chest x-ray showed no evidence of pneumonia.  We can send in Tessalon 100 mg twice daily as needed for cough if she would like.  I imagine that orthopedics gave her a pain medication.

## 2019-10-27 NOTE — Telephone Encounter (Signed)
Patient's sister advised as below. They agree to [facility] for cough to be sent into [facility].

## 2019-10-27 NOTE — Telephone Encounter (Signed)
[name], patient's sister, calling again stating that [facility] would be able to come out on the 22nd. [name] states patient needs someone to come out today. Please advise.

## 2019-10-30 MED ORDER — BENZONATATE 100 MG PO CAPS: 100.0000 mg | ORAL_CAPSULE | Freq: Two times a day (BID) | ORAL | 0 refills | Status: DC | PRN

## 2019-10-30 NOTE — Addendum Note (Signed)
Addended by: Virginia Crews on: 10/30/2019 08:58 AM   Modules accepted: Orders

## 2019-10-31 ENCOUNTER — Ambulatory Visit (INDEPENDENT_AMBULATORY_CARE_PROVIDER_SITE_OTHER): Payer: Medicare Other

## 2019-10-31 DIAGNOSIS — S62102D Fracture of unspecified carpal bone, left wrist, subsequent encounter for fracture with routine healing: Secondary | ICD-10-CM

## 2019-10-31 DIAGNOSIS — G20 Parkinson's disease: Secondary | ICD-10-CM

## 2019-10-31 DIAGNOSIS — I10 Essential (primary) hypertension: Secondary | ICD-10-CM

## 2019-10-31 DIAGNOSIS — S2242XD Multiple fractures of ribs, left side, subsequent encounter for fracture with routine healing: Secondary | ICD-10-CM

## 2019-10-31 NOTE — Chronic Care Management (AMB) (Signed)
Chronic Care Management   Initial Visit Note  10/31/2019 Name: Lori Brady MRN: [id number] DOB: [birth year]  Primary Care Provider: Virginia Crews, MD Reason for referral : Chronic Care Management   Lori Brady is a 73 y.o. year old female who is a primary care patient of Bacigalupo, Dionne Bucy, MD. The CCM team was consulted for assistance with chronic disease management and care coordination needs. A telephonic assessment was conducted today.   Review of Lori Brady's status, including review of consultants reports, relevant labs and test results was conducted today. Collaboration with appropriate care team members was performed as part of the comprehensive evaluation and provision of chronic care management services.    SDOH (Social Determinants of Health) screening performed today.  Medications: Outpatient Encounter Medications as of 10/31/2019  Medication Sig  . albuterol (ACCUNEB) 0.63 MG/3ML nebulizer solution Take 1 ampule by nebulization every 6 (six) hours as needed for wheezing.  Marland Kitchen aspirin 81 MG tablet Take 81 mg by mouth daily.  Marland Kitchen atorvastatin (LIPITOR) 40 MG tablet TAKE 1 TABLET BY MOUTH AT BEDTIME **STOPSIMVASTATIN**  . benzonatate (TESSALON) 100 MG capsule Take 1 capsule (100 mg total) by mouth 2 (two) times daily as needed for cough.  Marland Kitchen BREO ELLIPTA 100-25 MCG/INH AEPB INHALE 1 PUFF BY MOUTH ONCE DAILY AS DIRECTED  . Cholecalciferol (VITAMIN D) 2000 UNITS CAPS Take 1 capsule by mouth daily.  Marland Kitchen albuterol (VENTOLIN HFA) 108 (90 Base) MCG/ACT inhaler INHALE 2 PUFFS INTO THE LUNGS EVERY 6 HOURS AS NEEDED FOR WHEEZING ORSHORTNESS OF BREATH  . alendronate (FOSAMAX) 70 MG tablet One tab every Sunday on empty stomach with a full glass of water. Do not lie down or eat/drink anything else for the next 30 min.  . betamethasone valerate (VALISONE) 0.1 % cream APPLY TOPICALLY TO AFFECTED AREA SPARINGLY 2 TIMES DAILY.  . carbidopa-levodopa (SINEMET CR) 50-200 MG tablet Take 1  tablet by mouth daily.  . carbidopa-levodopa (SINEMET IR) 25-100 MG tablet Take 2 tablets by mouth 3 (three) times daily.   . cephALEXin (KEFLEX) 500 MG capsule Take 1 capsule (500 mg total) by mouth 3 (three) times daily for 7 days.  . cetirizine (ZYRTEC) 10 MG tablet Take 1 tablet (10 mg total) by mouth daily.  . citalopram (CELEXA) 20 MG tablet Take 1 tablet (20 mg total) by mouth daily.  . fluticasone (FLONASE) 50 MCG/ACT nasal spray PLACE TO SPRAYS INTO BOTHS NOSTRILS DAILY  . lisinopril (ZESTRIL) 10 MG tablet Take by mouth.  . montelukast (SINGULAIR) 10 MG tablet TAKE 1 TABLET BY MOUTH ONCE DAILY  . nystatin (MYCOSTATIN) 100000 UNIT/ML suspension Take 5 mLs (500,000 Units total) by mouth 4 (four) times daily. Until resolution (Patient not taking: Reported on 10/31/2019)  . omeprazole (PRILOSEC) 40 MG capsule Take 40 mg by mouth daily.   . ondansetron (ZOFRAN ODT) 4 MG disintegrating tablet Take 1 tablet (4 mg total) by mouth every 8 (eight) hours as needed.  . ondansetron (ZOFRAN) 4 MG tablet Take 1 tablet (4 mg total) by mouth every 8 (eight) hours as needed for nausea or vomiting.  . potassium chloride SA (K-DUR) 20 MEQ tablet TAKE 1 TABLET BY MOUTH ONCE DAILY  . Tiotropium Bromide Monohydrate (SPIRIVA RESPIMAT) 2.5 MCG/ACT AERS Use 2 puffs daily for copd   No facility-administered encounter medications on file as of 10/31/2019.     Objective:   BP Readings from Last 3 Encounters:  10/25/19 (!) 150/89  09/21/19 117/78  09/05/19 102/66  Lab Results  Component Value Date   CHOL 144 09/05/2019   HDL 44 09/05/2019   LDLCALC 80 09/05/2019   TRIG 107 09/05/2019   CHOLHDL 3.3 09/05/2019      Fall Risk  10/31/2019 10/31/2019 01/20/2019 12/08/2018 06/06/2018  Falls in the past year? - 1 1 0 No  Comment - - - - -  Number falls in past yr: - 1 0 0 -  Comment - - "passed out" - -  Injury with Fall? - 1 0 0 -  Comment - - - - -  Risk Factor Category  - - - - -  Risk for fall due  to : Impaired balance/gait;Medication side effect;Orthopedic patient;Other (Comment);History of fall(s) - Medication side effect;Other (Comment) - -  Risk for fall due to: Comment - - illness - -  Follow up Falls prevention discussed Falls prevention discussed - - -     Functional Status Survey: Is the patient deaf or have difficulty hearing?: Yes(Does not require hearing device/Reports difficulty hearing in right ear.) Does the patient have difficulty seeing, even when wearing glasses/contacts?: No Does the patient have difficulty concentrating, remembering, or making decisions?: No Does the patient have difficulty walking or climbing stairs?: Yes(Currently primarily wheelchair bound due injury to both arms./Family assisting) Does the patient have difficulty dressing or bathing?: Yes(Due to recent injury to both arms./Family assisting) Does the patient have difficulty doing errands alone such as visiting a doctor's office or shopping?: Yes(Family assisting)    Goals Addressed            This Visit's Progress   . Improve Ability to Self-Manage Chronic Conditions       Current Barriers:  . Chronic Disease Management support and education needs related to Hypertension, COPD, Parkinson's and CAD. (Recent falls resulting in injury to both upper extremities)   Case Manager Clinical Goal(s):  Marland Kitchen Over the next 90 days, patient will not require unexpected hospital admission. . Over the next 90 days, patient will take all medications as prescribed. . Over the next 90 days, patient will notify provider if status changes and with worsening s/sx related to cardiac or respiratory complications. . Over the next 90 days, patient will work with team to determine level of care concerns and possible need for additional assistance in the home. . Over the next 90 days, patient will adhere to recommended safety measures to prevent additional injuries. . Over the next 90 days, patient will attend all  medical appointments as schedule. .   Interventions:  . Reviewed medications. Encouraged to take medications as prescribed. Family is currently assisting with medication preparation and administration due patient being unable to use her upper extremities. Denies concerns regarding prescription costs. . Discussed worsening s/sx of complications related to cardiac and respiratory emergencies. Discussed indications for seeking immediate medical attention. Reports being unable to monitor BP or complete routine care d/t injuries to both upper extremities. . Reports productive cough with "clear to white" sputum. Denies shortness of breath or chest discomfort. Verbalized awareness of indications for notifying MD . Reviewed safety measures and provider recommendations regarding mobility. Reports being primarily wheelchair bound since recent injuries. Reports previously using walker or assistive device. Currently has hard cast to left hand and brace to right elbow. Family members are currently assisting with ADLs. Agreeable to agencies being contacted to provide additional in-home assistance if she qualifies. . Reviewed pending appointments. Encouraged to attend appointments as scheduled to prevent delays in care. Reports son, Remo Lipps is currently  providing transportation to appointments. . Discussed plans for ongoing care management and follow up. Provided direct contact information for care management team.  Patient Self Care Activities: (Recent falls resulting in injuries to both upper extremities) . Unable to self administer medications as prescribed . Unable to perform ADLs independently . Unable to perform IADLs independently  Initial goal documentation        Lori Brady was given information about Chronic Care Management services  including:  1. CCM service includes personalized support from designated clinical staff supervised by her physician, including individualized plan of care and coordination  with other care providers 2. 24/7 contact phone numbers for assistance for urgent and routine care needs. 3. Service will only be billed when office clinical staff spend 20 minutes or more in a month to coordinate care. 4. Only one practitioner may furnish and bill the service in a calendar month. 5. The patient may stop CCM services at any time (effective at the end of the month) by phone call to the office staff. 6. The patient will be responsible for cost sharing (co-pay) of up to 20% of the service fee (after annual deductible is met).  Patient agreed to services and verbal consent obtained.      PLAN The care management team will follow up next week.   [facility] [phone]

## 2019-11-02 ENCOUNTER — Telehealth: Payer: Self-pay

## 2019-11-02 NOTE — Telephone Encounter (Signed)
Spoke with Otila Kluver from [facility] and our mutual patient is having a procedure tomorrow, 11-03-19 and requested patient's last office visit note and most recent PFT. Faxed to Otila Kluver and [facility] at [phone].

## 2019-11-04 ENCOUNTER — Other Ambulatory Visit: Payer: Self-pay | Admitting: Family Medicine

## 2019-11-04 DIAGNOSIS — F419 Anxiety disorder, unspecified: Secondary | ICD-10-CM

## 2019-11-07 ENCOUNTER — Telehealth: Payer: Medicare Other

## 2019-11-07 ENCOUNTER — Ambulatory Visit: Payer: Self-pay

## 2019-11-07 NOTE — Chronic Care Management (AMB) (Signed)
  Chronic Care Management   Note  11/07/2019 Name: Lori Brady MRN: [id number] DOB: Feb 06, 1946    Brief outreach with Ms. Ainsley Spinner son Remo Lipps regarding in home services.  As of today, several home health agencies have declined services due to decreased staffing.  Informed Remo Lipps that he and Ms. Diez would be notified and updated when home health services are approved.   Follow up plan: The care management team will reach out to the family within the next week.   Horris Latino [facility] Care Management [phone]

## 2019-11-14 ENCOUNTER — Ambulatory Visit: Payer: Self-pay

## 2019-11-14 NOTE — Chronic Care Management (AMB) (Signed)
  Chronic Care Management   Note  11/14/2019 Name: Lori Brady MRN: [id number] DOB: 1946-03-11    Brief outreach with [name] and caregiver/son Remo Lipps regarding in-home services. Several agencies were contacted that may require out of pocket cost. Representatives also requested to speak to the patient and a family member directly to discuss specific needs and determine if services can be provided.  Ms. Grams contact information was provided. Remo Lipps also gave permission to provide his contact information and allow the agencies to contact him directly if needed.   Horris Latino [facility] Care Management [phone]

## 2019-11-18 ENCOUNTER — Other Ambulatory Visit: Payer: Self-pay | Admitting: Family Medicine

## 2019-11-21 ENCOUNTER — Telehealth: Payer: Self-pay

## 2019-11-21 NOTE — Telephone Encounter (Signed)
Rescheduled appointment on 11/23/2019 to 01/22/2020, patient fell and broke both arms. klh

## 2019-11-23 ENCOUNTER — Ambulatory Visit: Payer: Medicare Other | Admitting: Internal Medicine

## 2019-12-01 ENCOUNTER — Other Ambulatory Visit: Payer: Self-pay | Admitting: Physician Assistant

## 2019-12-27 ENCOUNTER — Other Ambulatory Visit: Payer: Self-pay | Admitting: Adult Health

## 2020-01-09 ENCOUNTER — Ambulatory Visit: Payer: Self-pay

## 2020-01-09 ENCOUNTER — Telehealth: Payer: Self-pay

## 2020-01-09 NOTE — Chronic Care Management (AMB) (Signed)
  Chronic Care Management   Outreach Note  01/09/2020 Name: Lori Brady MRN: [id number] DOB: 1946-09-04  Referred by: Virginia Crews, MD Reason for referral : Chronic Care Management   An unsuccessful telephone outreach was attempted today. [name] is currently engaged with the case management team. She was scheduled for routine outreach today.  Left a HIPAA compliant voice message requesting a return call.    Follow Up Plan: The care management team will attempt to follow up with [name] again next week.    Horris Latino [facility] Care [facility] [phone]

## 2020-01-16 ENCOUNTER — Telehealth: Payer: Self-pay

## 2020-01-16 ENCOUNTER — Ambulatory Visit: Payer: Self-pay

## 2020-01-16 NOTE — Chronic Care Management (AMB) (Signed)
  Chronic Care Management   Outreach Note  01/16/2020 Name: Lori Brady MRN: [id number] DOB: 1945/12/02  Referred by: Virginia Crews, MD Reason for referral : Chronic Care Management   An unsuccessful telephone outreach was attempted today. [name] is currently engaged with the Chronic Care Management team. A routine outreach was attempted today.  A HIPAA compliant voice message was left requesting a return call.   Follow Up Plan: The care management team will reach out to [name] again within the next two weeks.    Horris Latino [facility] Care Management [phone]

## 2020-01-18 ENCOUNTER — Telehealth: Payer: Self-pay

## 2020-01-18 NOTE — Progress Notes (Signed)
Subjective:   Lori Brady is a 74 y.o. female who presents for Medicare Annual (Subsequent) preventive examination.    This visit is being conducted through telemedicine due to the COVID-19 pandemic. This patient has given me verbal consent via doximity to conduct this visit, patient states they are participating from their home address. Some vital signs may be absent or patient reported.    Patient identification: identified by name, DOB, and current address  Review of Systems:  N/A  Cardiac Risk Factors include: advanced age (>45mn, >>47women);dyslipidemia;hypertension     Objective:     Vitals: There were no vitals taken for this visit.  There is no height or weight on file to calculate BMI. Unable to obtain vitals due to visit being conducted via telephonically.   Advanced Directives 01/22/2020 10/25/2019 09/21/2019 01/20/2019 07/16/2018 02/07/2018 01/13/2018  Does Patient Have a Medical Advance Directive? No No No No No No No  Would patient like information on creating a medical advance directive? No - Patient declined No - Patient declined - No - Patient declined No - Patient declined No - Patient declined No - Patient declined    Tobacco Social History   Tobacco Use  Smoking Status Former Smoker  . Packs/day: 1.00  . Years: 30.00  . Pack years: 30.00  . Types: Cigarettes  . Quit date: 08/01/2008  . Years since quitting: 11.4  Smokeless Tobacco Never Used     Counseling given: Not Answered   Clinical Intake:  Pre-visit preparation completed: Yes  Pain : No/denies pain Pain Score: 0-No pain     Nutritional Risks: None Diabetes: No  How often do you need to have someone help you when you read instructions, pamphlets, or other written materials from your doctor or pharmacy?: 1 - Never  Interpreter Needed?: No  Information [facility] :: [facility]  Past Medical History:  Diagnosis Date  . Arthritis   . Asthma   . Crohn's disease ([facility]   . Emphysema  of lung ([facility]   . GERD (gastroesophageal reflux disease)   . Hip fracture ([facility] 01/26/2017  . History of chicken pox   . History of heart attack   . Hx of completed stroke   . Hypercholesteremia   . Hypertension   . Parkinson disease ([facility] [facility]   . Pneumonia   . Seasonal allergies   . Sepsis ([facility]   . Septic shock ([facility] 10/08/2016  . UTI (lower urinary tract infection)    Past Surgical History:  Procedure Laterality Date  . BREAST BIOPSY Bilateral 1960's to 90's  . breast cysts    . CATARACT EXTRACTION, BILATERAL    . ELBOW SURGERY     right  . right wrist fracture    . TUBAL LIGATION    . WRIST SURGERY Left    Family History  Problem Relation Age of Onset  . Cancer Sister        ovarian/uterine/breast  . Breast cancer Sister 581 . Cancer Sister        breast  . Heart disease Sister   . Breast cancer Sister 683 . Heart disease Father   . Cancer Father        liver  . Pancreatic cancer Mother   . Heart disease Brother   . Heart attack Brother   . Cancer Brother        liver  . Heart disease Sister   . Diabetes Maternal Grandmother    Social History  Socioeconomic History  . Marital status: Widowed    Spouse name: Not on file  . Number of children: 3  . Years of education: Not on file  . Highest education level: Some college, no degree  Occupational History  . Occupation: retired  Tobacco Use  . Smoking status: Former Smoker    Packs/day: 1.00    Years: 30.00    Pack years: 30.00    Types: Cigarettes    Quit date: 08/01/2008    Years since quitting: 11.4  . Smokeless tobacco: Never Used  Substance and Sexual Activity  . Alcohol use: Never  . Drug use: No  . Sexual activity: Not on file  Other Topics Concern  . Not on file  Social History Narrative  . Not on file   Social Determinants of Health   Financial Resource Strain: Low Risk   . Difficulty of Paying Living Expenses: Not hard at all  Food Insecurity: No Food Insecurity  . Worried About  Charity fundraiser in the Last Year: Never true  . Ran Out of Food in the Last Year: Never true  Transportation Needs: No Transportation Needs  . Lack of Transportation (Medical): No  . Lack of Transportation (Non-Medical): No  Physical Activity: Inactive  . Days of Exercise per Week: 0 days  . Minutes of Exercise per Session: 0 min  Stress: No Stress Concern Present  . Feeling of Stress : Not at all  Social Connections: Moderately Isolated  . Frequency of Communication with Friends and Family: More than three times a week  . Frequency of Social Gatherings with Friends and Family: Three times a week  . Attends Religious Services: Never  . Active Member of Clubs or Organizations: No  . Attends Archivist Meetings: Never  . Marital Status: Widowed    Outpatient Encounter Medications as of 01/22/2020  Medication Sig  . acetaminophen (TYLENOL) 500 MG tablet Take 500 mg by mouth every 6 (six) hours as needed.   Marland Kitchen albuterol (ACCUNEB) 0.63 MG/3ML nebulizer solution Take 1 ampule by nebulization every 6 (six) hours as needed for wheezing.  Marland Kitchen albuterol (VENTOLIN HFA) 108 (90 Base) MCG/ACT inhaler INHALE 2 PUFFS INTO THE LUNGS EVERY 6 HOURS AS NEEDED FOR WHEEZING ORSHORTNESS OF BREATH  . alendronate (FOSAMAX) 70 MG tablet One tab every Sunday on empty stomach with a full glass of water. Do not lie down or eat/drink anything else for the next 30 min.  Marland Kitchen aspirin 81 MG tablet Take 81 mg by mouth daily.  Marland Kitchen atorvastatin (LIPITOR) 40 MG tablet TAKE 1 TABLET BY MOUTH AT BEDTIME **STOPSIMVASTATIN**  . benzonatate (TESSALON) 100 MG capsule Take 1 capsule (100 mg total) by mouth 2 (two) times daily as needed for cough.  . betamethasone valerate (VALISONE) 0.1 % cream APPLY TOPICALLY TO AFFECTED AREA SPARINGLY 2 TIMES DAILY.  Marland Kitchen BREO ELLIPTA 100-25 MCG/INH AEPB INHALE 1 PUFF BY MOUTH ONCE DAILY AS DIRECTED  . carbidopa-levodopa (SINEMET CR) 50-200 MG tablet Take 1 tablet by mouth daily. Before dose    . carbidopa-levodopa (SINEMET IR) 25-100 MG tablet Take 2 tablets by mouth 3 (three) times daily.   . cetirizine (ZYRTEC) 10 MG tablet Take 1 tablet (10 mg total) by mouth daily.  . Cholecalciferol (VITAMIN D) 2000 UNITS CAPS Take 1 capsule by mouth daily.  . citalopram (CELEXA) 20 MG tablet TAKE 1 TABLET BY MOUTH ONCE DAILY  . clobetasol (TEMOVATE) 0.05 % external solution Apply 1 application topically 2 (two) times  daily. As needed  . clobetasol cream (TEMOVATE) 3.71 % Apply 1 application topically 2 (two) times daily. As needed  . doxepin (SINEQUAN) 25 MG capsule Take 25 mg by mouth at bedtime.   . fluticasone (FLONASE) 50 MCG/ACT nasal spray PLACE TO SPRAYS INTO BOTHS NOSTRILS DAILY  . ibuprofen (ADVIL) 600 MG tablet ibuprofen 600 mg tablet  Take 1 tablet 3 times a day by oral route.  Marland Kitchen ketoconazole (NIZORAL) 2 % shampoo Apply 1 application topically 2 (two) times a week.  . montelukast (SINGULAIR) 10 MG tablet TAKE 1 TABLET BY MOUTH ONCE DAILY  . omeprazole (PRILOSEC) 40 MG capsule Take 40 mg by mouth daily.   . ondansetron (ZOFRAN ODT) 4 MG disintegrating tablet Take 1 tablet (4 mg total) by mouth every 8 (eight) hours as needed.  . potassium chloride SA (K-DUR) 20 MEQ tablet TAKE 1 TABLET BY MOUTH ONCE DAILY  . Tiotropium Bromide Monohydrate (SPIRIVA RESPIMAT) 2.5 MCG/ACT AERS Use 2 puffs daily for copd  . lisinopril (ZESTRIL) 10 MG tablet Take 10 mg by mouth daily.   Marland Kitchen nystatin (MYCOSTATIN) 100000 UNIT/ML suspension Take 5 mLs (500,000 Units total) by mouth 4 (four) times daily. Until resolution (Patient not taking: Reported on 10/31/2019)  . ondansetron (ZOFRAN) 4 MG tablet Take 1 tablet (4 mg total) by mouth every 8 (eight) hours as needed for nausea or vomiting. (Patient not taking: Reported on 01/22/2020)   No facility-administered encounter medications on file as of 01/22/2020.    Activities of Daily Living In your present state of health, do you have any difficulty performing the  following activities: 01/22/2020 10/31/2019  Hearing? Y Y  Comment Deaf in left ear. Does not wear hearing aids. Does not require hearing device/Reports difficulty hearing in right ear.  Vision? N N  Difficulty concentrating or making decisions? N N  Walking or climbing stairs? Y Y  Comment Due to mobility issues. Currently primarily wheelchair bound due injury to both arms./Family assisting  Dressing or bathing? N Y  Comment - Due to recent injury to both arms./Family assisting  Doing errands, shopping? Tempie Donning  Comment Currently does not drive. Family Risk analyst and eating ? N -  Using the Toilet? N -  In the past six months, have you accidently leaked urine? N -  Do you have problems with loss of bowel control? N -  Managing your Medications? N -  Managing your Finances? Y -  Comment Sister manages finances. -  Housekeeping or managing your Housekeeping? Y -  Comment Sister does the housework. -  Some recent data might be hidden    Patient Care Team: Virginia Crews, MD as PCP - General (Family Medicine) Corey Skains, MD as Consulting Physician (Cardiology) Jannet Mantis, MD as Consulting Physician (Dermatology) Allyne Gee, MD as Consulting Physician (Internal Medicine) Katy Apo, MD as Consulting Physician (Ophthalmology) Gabriel Carina, Betsey Holiday, MD as Physician Assistant (Endocrinology) Vladimir Crofts, MD as Consulting Physician (Neurology) Paulene Floor as Physician Assistant (Physician Assistant) Rutherford Limerick, MD as Attending Physician (Orthopedic Surgery)    Assessment:   This is a routine wellness examination for Nayvie.  Exercise Activities and Dietary recommendations Current Exercise Habits: The patient does not participate in regular exercise at present, Exercise limited by: orthopedic condition(s);Other - see comments(Due to Parkinsons.)  Goals    . Improve Ability to Self-Manage Chronic Conditions    . LIFESTYLE -  DECREASE FALLS RISK  Recommend to remove any items from the home that may cause slips or trips.    . Weight (lb) < 130 lb (59 kg)     Recommend eating 3 small meals with two healthy protein snacks in between.        Fall Risk: Fall Risk  01/22/2020 10/31/2019 10/31/2019 01/20/2019 12/08/2018  Falls in the past year? 1 - 1 1 0  Comment - - - - -  Number falls in past yr: 1 - 1 0 0  Comment - - - "passed out" -  Injury with Fall? 1 - 1 0 0  Comment - - - - -  Risk Factor Category  - - - - -  Risk for fall due to : Impaired mobility;Impaired balance/gait Impaired balance/gait;Medication side effect;Orthopedic patient;Other (Comment);History of fall(s) - Medication side effect;Other (Comment) -  Risk for fall due to: Comment Due to Parkinsons. - - illness -  Follow up Falls prevention discussed Falls prevention discussed Falls prevention discussed - -    FALL RISK PREVENTION PERTAINING TO THE HOME:  Any stairs in or around the home? Yes  If so, are there any without handrails? No   Home free of loose throw rugs in walkways, pet beds, electrical cords, etc? Yes  Adequate lighting in your home to reduce risk of falls? Yes   ASSISTIVE DEVICES UTILIZED TO PREVENT FALLS:  Life alert? No  Use of a cane, walker or w/c? Yes  Grab bars in the bathroom? Yes  Shower chair or bench in shower? Yes  Elevated toilet seat or a handicapped toilet? Yes    TIMED UP AND GO:  Was the test performed? No .    Depression Screen PHQ 2/9 Scores 01/22/2020 01/20/2019 06/06/2018 01/04/2018  PHQ - 2 Score 0 1 0 1  PHQ- 9 Score - - - -     Cognitive Function: Declined today.         Immunization History  Administered Date(s) Administered  . Fluad Quad(high Dose 65+) 09/05/2019  . Influenza Split 10/28/2011, 08/10/2012  . Influenza, High Dose Seasonal PF 08/21/2014, 08/29/2015  . Influenza,inj,Quad PF,6+ Mos 08/16/2013  . Influenza-Unspecified 08/16/2014, 08/11/2018  . Pneumococcal Conjugate-13  01/10/2015  . Pneumococcal Polysaccharide-23 05/29/2013  . Tdap 04/01/2017  . Zoster 08/10/2012    Qualifies for Shingles Vaccine? Yes  Zostavax completed 08/10/12. Due for Shingrix. Pt has been advised to call insurance company to determine out of pocket expense. Advised may also receive vaccine at local pharmacy or Health Dept. Verbalized acceptance and understanding.  Tdap: Up to date  Flu Vaccine: Up to date  Pneumococcal Vaccine: Completed series  Screening Tests Health Maintenance  Topic Date Due  . MAMMOGRAM  06/17/2018  . COLONOSCOPY  04/21/2020  . DEXA SCAN  12/29/2020  . TETANUS/TDAP  04/02/2027  . INFLUENZA VACCINE  Completed  . Hepatitis C Screening  Completed  . PNA vac Low Risk Adult  Completed    Cancer Screenings:  Colorectal Screening: Completed 04/21/10. Repeat every 10 years.   Mammogram: Completed 06/17/16. Repeat every 1-2 years as advised. Ordered 01/20/19 today. Pt provided with contact info and advised to call to schedule appt.   Bone Density: Completed 12/29/18. Results reflect OSTEOPOROSIS. Repeat every 2 years.   Lung Cancer Screening: (Low Dose CT Chest recommended if Age 89-80 years, 30 pack-year currently smoking OR have quit w/in 15years.) does qualify however declines screening at this time.  Additional Screening:  Hepatitis C Screening: Up to date  Vision Screening:  Recommended annual ophthalmology exams for early detection of glaucoma and other disorders of the eye.  Dental Screening: Recommended annual dental exams for proper oral hygiene  Community Resource Referral:  CRR required this visit?  No       Plan:  I have personally reviewed and addressed the Medicare Annual Wellness questionnaire and have noted the following in the patient's chart:  A. Medical and social history B. Use of alcohol, tobacco or illicit drugs  C. Current medications and supplements D. Functional ability and status E.  Nutritional status F.  Physical  activity G. Advance directives H. List of other physicians I.  Hospitalizations, surgeries, and ER visits in previous 12 months J.  Green such as hearing and vision if needed, cognitive and depression L. Referrals and appointments   In addition, I have reviewed and discussed with patient certain preventive protocols, quality metrics, and best practice recommendations. A written personalized care plan for preventive services as well as general preventive health recommendations were provided to patient. Nurse Health Advisor  Signed,    Khalis Hittle [place], [place]  02/14/2877 Nurse Health Advisor   Nurse Notes: Mammogram ordered today. Pt to call office and set up apt.

## 2020-01-18 NOTE — Telephone Encounter (Signed)
Confirmed appointment on 01/22/2020 and screened for covid. klh

## 2020-01-22 ENCOUNTER — Ambulatory Visit (INDEPENDENT_AMBULATORY_CARE_PROVIDER_SITE_OTHER): Payer: Medicare PPO | Admitting: Family Medicine

## 2020-01-22 ENCOUNTER — Ambulatory Visit (INDEPENDENT_AMBULATORY_CARE_PROVIDER_SITE_OTHER): Payer: Medicare PPO

## 2020-01-22 ENCOUNTER — Encounter: Payer: Self-pay | Admitting: Internal Medicine

## 2020-01-22 ENCOUNTER — Other Ambulatory Visit: Payer: Self-pay

## 2020-01-22 ENCOUNTER — Ambulatory Visit: Payer: Medicare PPO | Admitting: Internal Medicine

## 2020-01-22 ENCOUNTER — Encounter: Payer: Self-pay | Admitting: Family Medicine

## 2020-01-22 VITALS — BP 113/73 | HR 91 | Temp 95.4°F | Resp 16 | Ht 61.0 in | Wt 144.0 lb

## 2020-01-22 VITALS — BP 99/67 | HR 88 | Temp 96.9°F | Resp 16 | Ht 61.0 in | Wt 144.0 lb

## 2020-01-22 DIAGNOSIS — J301 Allergic rhinitis due to pollen: Secondary | ICD-10-CM | POA: Diagnosis not present

## 2020-01-22 DIAGNOSIS — G20 Parkinson's disease: Secondary | ICD-10-CM | POA: Diagnosis not present

## 2020-01-22 DIAGNOSIS — Z1231 Encounter for screening mammogram for malignant neoplasm of breast: Secondary | ICD-10-CM

## 2020-01-22 DIAGNOSIS — Z0000 Encounter for general adult medical examination without abnormal findings: Secondary | ICD-10-CM | POA: Diagnosis not present

## 2020-01-22 DIAGNOSIS — K5010 Crohn's disease of large intestine without complications: Secondary | ICD-10-CM

## 2020-01-22 DIAGNOSIS — G20C Parkinsonism, unspecified: Secondary | ICD-10-CM

## 2020-01-22 DIAGNOSIS — I428 Other cardiomyopathies: Secondary | ICD-10-CM | POA: Diagnosis not present

## 2020-01-22 DIAGNOSIS — J449 Chronic obstructive pulmonary disease, unspecified: Secondary | ICD-10-CM | POA: Diagnosis not present

## 2020-01-22 DIAGNOSIS — R0602 Shortness of breath: Secondary | ICD-10-CM

## 2020-01-22 DIAGNOSIS — J4489 Other specified chronic obstructive pulmonary disease: Secondary | ICD-10-CM

## 2020-01-22 DIAGNOSIS — I10 Essential (primary) hypertension: Secondary | ICD-10-CM

## 2020-01-22 DIAGNOSIS — K219 Gastro-esophageal reflux disease without esophagitis: Secondary | ICD-10-CM | POA: Diagnosis not present

## 2020-01-22 DIAGNOSIS — I429 Cardiomyopathy, unspecified: Secondary | ICD-10-CM

## 2020-01-22 DIAGNOSIS — D692 Other nonthrombocytopenic purpura: Secondary | ICD-10-CM

## 2020-01-22 DIAGNOSIS — E876 Hypokalemia: Secondary | ICD-10-CM

## 2020-01-22 MED ORDER — DOXEPIN HCL 25 MG PO CAPS: 25.0000 mg | ORAL_CAPSULE | Freq: Every day | ORAL | 3 refills | Status: DC

## 2020-01-22 NOTE — Assessment & Plan Note (Signed)
Stable No medication changes Encouraged f/u with Neurology Refilled doxepin

## 2020-01-22 NOTE — Assessment & Plan Note (Signed)
History of Takotsubo stress-induced in 2009 Followed by Cardiology BP well controlled currnetly on statin

## 2020-01-22 NOTE — Patient Instructions (Signed)
Lori Brady , Thank you for taking time to come for your Medicare Wellness Visit. I appreciate your ongoing commitment to your health goals. Please review the following plan we discussed and let me know if I can assist you in the future.   Screening recommendations/referrals: Colonoscopy: Up to date, due 04/2020 Mammogram: Ordered 01/20/19 today. Pt provided with contact info and advised to call to schedule appt.  Bone Density: Up to date, due 12/2020 Recommended yearly ophthalmology/optometry visit for glaucoma screening and checkup Recommended yearly dental visit for hygiene and checkup  Vaccinations: Influenza vaccine: Up to date Pneumococcal vaccine: Completed series Tdap vaccine: Up to date, due 03/2027 Shingles vaccine: Pt declines today.     Advanced directives: Advance directive discussed with you today. Even though you declined this today please call our office should you change your mind and we can give you the proper paperwork for you to fill out.  Conditions/risks identified: Fall risk prevention discussed today. Recommend to continue to try and eat 3 small meals a day with 2 healthy snacks in between.  Next appointment: 2:00 PM today with Dr Beryle Flock    Preventive Care 1 Years and Older, Female Preventive care refers to lifestyle choices and visits with your health care provider that can promote health and wellness. What does preventive care include?  A yearly physical exam. This is also called an annual well check.  Dental exams once or twice a year.  Routine eye exams. Ask your health care provider how often you should have your eyes checked.  Personal lifestyle choices, including:  Daily care of your teeth and gums.  Regular physical activity.  Eating a healthy diet.  Avoiding tobacco and drug use.  Limiting alcohol use.  Practicing safe sex.  Taking low-dose aspirin every day.  Taking vitamin and mineral supplements as recommended by your health care  provider. What happens during an annual well check? The services and screenings done by your health care provider during your annual well check will depend on your age, overall health, lifestyle risk factors, and family history of disease. Counseling  Your health care provider may ask you questions about your:  Alcohol use.  Tobacco use.  Drug use.  Emotional well-being.  Home and relationship well-being.  Sexual activity.  Eating habits.  History of falls.  Memory and ability to understand (cognition).  Work and work Astronomer.  Reproductive health. Screening  You may have the following tests or measurements:  Height, weight, and BMI.  Blood pressure.  Lipid and cholesterol levels. These may be checked every 5 years, or more frequently if you are over 8 years old.  Skin check.  Lung cancer screening. You may have this screening every year starting at age 16 if you have a 30-pack-year history of smoking and currently smoke or have quit within the past 15 years.  Fecal occult blood test (FOBT) of the stool. You may have this test every year starting at age 93.  Flexible sigmoidoscopy or colonoscopy. You may have a sigmoidoscopy every 5 years or a colonoscopy every 10 years starting at age 16.  Hepatitis C blood test.  Hepatitis B blood test.  Sexually transmitted disease (STD) testing.  Diabetes screening. This is done by checking your blood sugar (glucose) after you have not eaten for a while (fasting). You may have this done every 1-3 years.  Bone density scan. This is done to screen for osteoporosis. You may have this done starting at age 50.  Mammogram. This may  be done every 1-2 years. Talk to your health care provider about how often you should have regular mammograms. Talk with your health care provider about your test results, treatment options, and if necessary, the need for more tests. Vaccines  Your health care provider may recommend certain  vaccines, such as:  Influenza vaccine. This is recommended every year.  Tetanus, diphtheria, and acellular pertussis (Tdap, Td) vaccine. You may need a Td booster every 10 years.  Zoster vaccine. You may need this after age 78.  Pneumococcal 13-valent conjugate (PCV13) vaccine. One dose is recommended after age 62.  Pneumococcal polysaccharide (PPSV23) vaccine. One dose is recommended after age 56. Talk to your health care provider about which screenings and vaccines you need and how often you need them. This information is not intended to replace advice given to you by your health care provider. Make sure you discuss any questions you have with your health care provider. Document Released: 11/29/2015 Document Revised: 07/22/2016 Document Reviewed: 09/03/2015 Elsevier Interactive Patient Education  2017 [place] Prevention in the Home Falls can cause injuries. They can happen to people of all ages. There are many things you can do to make your home safe and to help prevent falls. What can I do on the outside of my home?  Regularly fix the edges of walkways and driveways and fix any cracks.  Remove anything that might make you trip as you walk through a door, such as a raised step or threshold.  Trim any bushes or trees on the path to your home.  Use bright outdoor lighting.  Clear any walking paths of anything that might make someone trip, such as rocks or tools.  Regularly check to see if handrails are loose or broken. Make sure that both sides of any steps have handrails.  Any raised decks and porches should have guardrails on the edges.  Have any leaves, snow, or ice cleared regularly.  Use sand or salt on walking paths during winter.  Clean up any spills in your garage right away. This includes oil or grease spills. What can I do in the bathroom?  Use night lights.  Install grab bars by the toilet and in the tub and shower. Do not use towel bars as grab  bars.  Use non-skid mats or decals in the tub or shower.  If you need to sit down in the shower, use a plastic, non-slip stool.  Keep the floor dry. Clean up any water that spills on the floor as soon as it happens.  Remove soap buildup in the tub or shower regularly.  Attach bath mats securely with double-sided non-slip rug tape.  Do not have throw rugs and other things on the floor that can make you trip. What can I do in the bedroom?  Use night lights.  Make sure that you have a light by your bed that is easy to reach.  Do not use any sheets or blankets that are too big for your bed. They should not hang down onto the floor.  Have a firm chair that has side arms. You can use this for support while you get dressed.  Do not have throw rugs and other things on the floor that can make you trip. What can I do in the kitchen?  Clean up any spills right away.  Avoid walking on wet floors.  Keep items that you use a lot in easy-to-reach places.  If you need to reach something above you,  use a strong step stool that has a grab bar.  Keep electrical cords out of the way.  Do not use floor polish or wax that makes floors slippery. If you must use wax, use non-skid floor wax.  Do not have throw rugs and other things on the floor that can make you trip. What can I do with my stairs?  Do not leave any items on the stairs.  Make sure that there are handrails on both sides of the stairs and use them. Fix handrails that are broken or loose. Make sure that handrails are as long as the stairways.  Check any carpeting to make sure that it is firmly attached to the stairs. Fix any carpet that is loose or worn.  Avoid having throw rugs at the top or bottom of the stairs. If you do have throw rugs, attach them to the floor with carpet tape.  Make sure that you have a light switch at the top of the stairs and the bottom of the stairs. If you do not have them, ask someone to add them for  you. What else can I do to help prevent falls?  Wear shoes that:  Do not have high heels.  Have rubber bottoms.  Are comfortable and fit you well.  Are closed at the toe. Do not wear sandals.  If you use a stepladder:  Make sure that it is fully opened. Do not climb a closed stepladder.  Make sure that both sides of the stepladder are locked into place.  Ask someone to hold it for you, if possible.  Clearly mark and make sure that you can see:  Any grab bars or handrails.  First and last steps.  Where the edge of each step is.  Use tools that help you move around (mobility aids) if they are needed. These include:  Canes.  Walkers.  Scooters.  Crutches.  Turn on the lights when you go into a dark area. Replace any light bulbs as soon as they burn out.  Set up your furniture so you have a clear path. Avoid moving your furniture around.  If any of your floors are uneven, fix them.  If there are any pets around you, be aware of where they are.  Review your medicines with your doctor. Some medicines can make you feel dizzy. This can increase your chance of falling. Ask your doctor what other things that you can do to help prevent falls. This information is not intended to replace advice given to you by your health care provider. Make sure you discuss any questions you have with your health care provider. Document Released: 08/29/2009 Document Revised: 04/09/2016 Document Reviewed: 12/07/2014 Elsevier Interactive Patient Education  2017 [employer].

## 2020-01-22 NOTE — Progress Notes (Signed)
[facility] [place], [place] [address]  Pulmonary Sleep Medicine   Office Visit Note  Patient Name: Lori Brady DOB: [birth year] MRN [id number]  Date of Service: 01/22/2020  Complaints/HPI: Pt is here for pulmonary follow up on COPd and GERD.  She also has a history or parkinson's disease.  She denies any recent issues.  She is using her inhalers.  She has NOT been hospitalized recently.  She uses spiriva and breo daily.  She also has a proair inhaler for emergency.    ROS  General: (-) fever, (-) chills, (-) night sweats, (-) weakness Skin: (-) rashes, (-) itching,. Eyes: (-) visual changes, (-) redness, (-) itching. Nose and Sinuses: (-) nasal stuffiness or itchiness, (-) postnasal drip, (-) nosebleeds, (-) sinus trouble. Mouth and Throat: (-) sore throat, (-) hoarseness. Neck: (-) swollen glands, (-) enlarged thyroid, (-) neck pain. Respiratory: - cough, (-) bloody sputum, - shortness of breath, - wheezing. Cardiovascular: - ankle swelling, (-) chest pain. Lymphatic: (-) lymph node enlargement. Neurologic: (-) numbness, (-) tingling. Psychiatric: (-) anxiety, (-) depression   Current Medication: Outpatient Encounter Medications as of 01/22/2020  Medication Sig  . acetaminophen (TYLENOL) 500 MG tablet Take 500 mg by mouth every 6 (six) hours as needed.   Marland Kitchen albuterol (ACCUNEB) 0.63 MG/3ML nebulizer solution Take 1 ampule by nebulization every 6 (six) hours as needed for wheezing.  Marland Kitchen albuterol (VENTOLIN HFA) 108 (90 Base) MCG/ACT inhaler INHALE 2 PUFFS INTO THE LUNGS EVERY 6 HOURS AS NEEDED FOR WHEEZING ORSHORTNESS OF BREATH  . alendronate (FOSAMAX) 70 MG tablet One tab every Sunday on empty stomach with a full glass of water. Do not lie down or eat/drink anything else for the next 30 min.  Marland Kitchen aspirin 81 MG tablet Take 81 mg by mouth daily.  Marland Kitchen atorvastatin (LIPITOR) 40 MG tablet TAKE 1 TABLET BY MOUTH AT BEDTIME **STOPSIMVASTATIN**  . benzonatate  (TESSALON) 100 MG capsule Take 1 capsule (100 mg total) by mouth 2 (two) times daily as needed for cough.  . betamethasone valerate (VALISONE) 0.1 % cream APPLY TOPICALLY TO AFFECTED AREA SPARINGLY 2 TIMES DAILY.  Marland Kitchen BREO ELLIPTA 100-25 MCG/INH AEPB INHALE 1 PUFF BY MOUTH ONCE DAILY AS DIRECTED  . carbidopa-levodopa (SINEMET CR) 50-200 MG tablet Take 1 tablet by mouth daily. Before dose  . carbidopa-levodopa (SINEMET IR) 25-100 MG tablet Take 2 tablets by mouth 3 (three) times daily.   . cetirizine (ZYRTEC) 10 MG tablet Take 1 tablet (10 mg total) by mouth daily.  . Cholecalciferol (VITAMIN D) 2000 UNITS CAPS Take 1 capsule by mouth daily.  . citalopram (CELEXA) 20 MG tablet TAKE 1 TABLET BY MOUTH ONCE DAILY  . clobetasol (TEMOVATE) 0.05 % external solution Apply 1 application topically 2 (two) times daily. As needed  . clobetasol cream (TEMOVATE) 3.41 % Apply 1 application topically 2 (two) times daily. As needed  . doxepin (SINEQUAN) 25 MG capsule Take 25 mg by mouth at bedtime.   . fluticasone (FLONASE) 50 MCG/ACT nasal spray PLACE TO SPRAYS INTO BOTHS NOSTRILS DAILY  . ibuprofen (ADVIL) 600 MG tablet ibuprofen 600 mg tablet  Take 1 tablet 3 times a day by oral route.  Marland Kitchen ketoconazole (NIZORAL) 2 % shampoo Apply 1 application topically 2 (two) times a week.  Marland Kitchen lisinopril (ZESTRIL) 10 MG tablet Take 10 mg by mouth daily.   . montelukast (SINGULAIR) 10 MG tablet TAKE 1 TABLET BY MOUTH ONCE DAILY  . nystatin (MYCOSTATIN) 100000 UNIT/ML suspension Take  5 mLs (500,000 Units total) by mouth 4 (four) times daily. Until resolution  . omeprazole (PRILOSEC) 40 MG capsule Take 40 mg by mouth daily.   . ondansetron (ZOFRAN ODT) 4 MG disintegrating tablet Take 1 tablet (4 mg total) by mouth every 8 (eight) hours as needed.  . ondansetron (ZOFRAN) 4 MG tablet Take 1 tablet (4 mg total) by mouth every 8 (eight) hours as needed for nausea or vomiting.  . potassium chloride SA (K-DUR) 20 MEQ tablet TAKE 1  TABLET BY MOUTH ONCE DAILY  . Tiotropium Bromide Monohydrate (SPIRIVA RESPIMAT) 2.5 MCG/ACT AERS Use 2 puffs daily for copd   No facility-administered encounter medications on file as of 01/22/2020.    Surgical History: Past Surgical History:  Procedure Laterality Date  . BREAST BIOPSY Bilateral 1960's to 90's  . breast cysts    . CATARACT EXTRACTION, BILATERAL    . ELBOW SURGERY     right  . right wrist fracture    . TUBAL LIGATION    . WRIST SURGERY Left     Medical History: Past Medical History:  Diagnosis Date  . Arthritis   . Asthma   . Crohn's disease ([place])   . Emphysema of lung ([place])   . GERD (gastroesophageal reflux disease)   . Hip fracture ([place]) 01/26/2017  . History of chicken pox   . History of heart attack   . Hx of completed stroke   . Hypercholesteremia   . Hypertension   . Parkinson disease ([place])   . Pneumonia   . Seasonal allergies   . Sepsis ([place])   . Septic shock ([place]) 10/08/2016  . UTI (lower urinary tract infection)     Family History: Family History  Problem Relation Age of Onset  . Cancer Sister        ovarian/uterine/breast  . Breast cancer Sister 46  . Cancer Sister        breast  . Heart disease Sister   . Breast cancer Sister 1  . Heart disease Father   . Cancer Father        liver  . Pancreatic cancer Mother   . Heart disease Brother   . Heart attack Brother   . Cancer Brother        liver  . Heart disease Sister   . Diabetes Maternal Grandmother     Social History: Social History   Socioeconomic History  . Marital status: Widowed    Spouse name: Not on file  . Number of children: 3  . Years of education: Not on file  . Highest education level: Some college, no degree  Occupational History  . Occupation: retired  Tobacco Use  . Smoking status: Former Smoker    Packs/day: 1.00    Years: 30.00    Pack years: 30.00    Types: Cigarettes    Quit date: 08/01/2008    Years since quitting: 11.4  . Smokeless tobacco:  Never Used  Substance and Sexual Activity  . Alcohol use: Never  . Drug use: No  . Sexual activity: Not on file  Other Topics Concern  . Not on file  Social History Narrative  . Not on file   Social Determinants of Health   Financial Resource Strain: Low Risk   . Difficulty of Paying Living Expenses: Not hard at all  Food Insecurity: No Food Insecurity  . Worried About Charity fundraiser in the Last Year: Never true  . Ran Out of Food in the  Last Year: Never true  Transportation Needs: No Transportation Needs  . Lack of Transportation (Medical): No  . Lack of Transportation (Non-Medical): No  Physical Activity: Inactive  . Days of Exercise per Week: 0 days  . Minutes of Exercise per Session: 0 min  Stress: No Stress Concern Present  . Feeling of Stress : Not at all  Social Connections: Moderately Isolated  . Frequency of Communication with Friends and Family: More than three times a week  . Frequency of Social Gatherings with Friends and Family: Three times a week  . Attends Religious Services: Never  . Active Member of Clubs or Organizations: No  . Attends Archivist Meetings: Never  . Marital Status: Widowed  Intimate Partner Violence: Not At Risk  . Fear of Current or Ex-Partner: No  . Emotionally Abused: No  . Physically Abused: No  . Sexually Abused: No    Vital Signs: Blood pressure 113/73, pulse 91, temperature (!) 95.4 F (35.2 C), resp. rate 16, height 5' 1"  (1.549 m), weight 144 lb (65.3 kg), SpO2 98 %.  Examination: General Appearance: The patient is well-developed, well-nourished, and in no distress. Skin: Gross inspection of skin unremarkable. Head: normocephalic, no gross deformities. Eyes: no gross deformities noted. ENT: ears appear grossly normal no exudates. Neck: Supple. No thyromegaly. No LAD. Respiratory: clear bilaterally. Cardiovascular: Normal S1 and S2 without murmur or rub. Extremities: No cyanosis. pulses are  equal. Neurologic: Alert and oriented. No involuntary movements.  LABS: No results found for this or any previous visit (from the past 2160 hour(s)).  Radiology: DG Ribs Unilateral W/Chest Left  Result Date: 10/25/2019 CLINICAL DATA:  Recent fall with left rib pain, initial encounter EXAM: LEFT RIBS AND CHEST - 3+ VIEW COMPARISON:  07/17/2018 FINDINGS: Cardiac shadow is mildly enlarged but stable. Aortic calcifications are again seen. The lungs are hypoinflated but otherwise clear with mild chronic interstitial changes. No pneumothorax is seen. Mildly displaced fractures of the left sixth and seventh ribs are noted laterally. No other focal abnormality is noted. IMPRESSION: Left sixth and seventh rib fractures without complicating factors. Electronically Signed   By: Inez Catalina M.D.   On: 10/25/2019 21:06   DG Wrist 2 Views Left  Result Date: 10/25/2019 CLINICAL DATA:  Status post reduction EXAM: LEFT WRIST - 2 VIEW COMPARISON:  10/25/2019 FINDINGS: Casting material is now seen in place. The previously seen distal radial fracture is again noted with impaction and posterior angulation at the fracture site. Very mild reduction is noted when compared with the prior exam. No other focal abnormality is seen. IMPRESSION: Slight reduction when compare with the prior exam. Persistent posterior angulation remains. Electronically Signed   By: Inez Catalina M.D.   On: 10/25/2019 21:01   DG Wrist Complete Left  Result Date: 10/25/2019 CLINICAL DATA:  Fall, pain, deformity EXAM: LEFT WRIST - COMPLETE 3+ VIEW COMPARISON:  None. FINDINGS: Comminuted, impacted and dorsally angulated fracture of the distal radial metaphysis with extension into the distal radioulnar joint and likely extension to the radiocarpal joint as well. Minimally displaced ulnar styloid process fracture is noted as well. Radiocarpal articulation is maintained with a dominant distal radial fracture fragment. Circumferential soft tissue swelling  of the wrist. No other acute fracture or traumatic malalignment. IMPRESSION: 1. Comminuted, impacted and dorsally angulated fracture of the distal radial metaphysis with extension into the distal radioulnar and radiocarpal joints. 2. Minimally displaced ulnar styloid process fracture. Electronically Signed   By: Elwin Sleight.D.  On: 10/25/2019 18:51   CT Head Wo Contrast  Result Date: 10/25/2019 CLINICAL DATA:  Pain status post fall EXAM: CT HEAD WITHOUT CONTRAST CT MAXILLOFACIAL WITHOUT CONTRAST TECHNIQUE: Multidetector CT imaging of the head and maxillofacial structures were performed using the standard protocol without intravenous contrast. Multiplanar CT image reconstructions of the maxillofacial structures were also generated. COMPARISON:  September 21, 2019 FINDINGS: CT HEAD FINDINGS Brain: No evidence of acute infarction, hemorrhage, hydrocephalus, extra-axial collection or mass lesion/mass effect. Atrophy and chronic microvascular ischemic changes are noted. There is an old left basal ganglia infarct. Vascular: No hyperdense vessel or unexpected calcification. Skull: Normal. Negative for fracture or focal lesion. Other: None. CT MAXILLOFACIAL FINDINGS Osseous: There is a nondisplaced fracture involving the posterior wall of the left maxillary sinus. There is a probable nondisplaced fracture involving the anterior wall of the maxillary sinus. Orbits: Negative. No traumatic or inflammatory finding. Sinuses: There is mucosal thickening of the left maxillary sinus with a small left air-fluid level within the left maxillary sinus. Soft tissues: There is left facial soft tissue swelling a small left facial hematoma. IMPRESSION: 1. Nondisplaced fracture involving the posterior wall of the left maxillary sinus. Probable nondisplaced fracture involving the anterior wall of the left maxillary sinus. 2. Left facial soft tissue swelling and small left facial hematoma. 3. No evidence for acute intracranial process.  Electronically Signed   By: Constance Holster M.D.   On: 10/25/2019 21:23   CT Maxillofacial Wo Contrast  Result Date: 10/25/2019 CLINICAL DATA:  Pain status post fall EXAM: CT HEAD WITHOUT CONTRAST CT MAXILLOFACIAL WITHOUT CONTRAST TECHNIQUE: Multidetector CT imaging of the head and maxillofacial structures were performed using the standard protocol without intravenous contrast. Multiplanar CT image reconstructions of the maxillofacial structures were also generated. COMPARISON:  September 21, 2019 FINDINGS: CT HEAD FINDINGS Brain: No evidence of acute infarction, hemorrhage, hydrocephalus, extra-axial collection or mass lesion/mass effect. Atrophy and chronic microvascular ischemic changes are noted. There is an old left basal ganglia infarct. Vascular: No hyperdense vessel or unexpected calcification. Skull: Normal. Negative for fracture or focal lesion. Other: None. CT MAXILLOFACIAL FINDINGS Osseous: There is a nondisplaced fracture involving the posterior wall of the left maxillary sinus. There is a probable nondisplaced fracture involving the anterior wall of the maxillary sinus. Orbits: Negative. No traumatic or inflammatory finding. Sinuses: There is mucosal thickening of the left maxillary sinus with a small left air-fluid level within the left maxillary sinus. Soft tissues: There is left facial soft tissue swelling a small left facial hematoma. IMPRESSION: 1. Nondisplaced fracture involving the posterior wall of the left maxillary sinus. Probable nondisplaced fracture involving the anterior wall of the left maxillary sinus. 2. Left facial soft tissue swelling and small left facial hematoma. 3. No evidence for acute intracranial process. Electronically Signed   By: Constance Holster M.D.   On: 10/25/2019 21:23    No results found.  No results found.    Assessment and Plan: Patient Active Problem List   Diagnosis Date Noted  . Crohn's disease of colon without complication ([place]) 01/65/5374  .  Hearing loss 06/01/2019  . Vertigo 06/01/2019  . Hypokalemia 07/22/2018  . Oral thrush 07/22/2018  . Gastroenteritis 07/16/2018  . Primary Parkinsonism ([place]) 01/05/2018  . SIADH (syndrome of inappropriate ADH production) ([place]) 10/01/2017  . Spinal stenosis 10/01/2017  . Elevated troponin I level 11/18/2016  . COPD (chronic obstructive pulmonary disease) with chronic bronchitis ([place]) 08/12/2016  . Asthma without status asthmaticus 03/31/2016  . Osteoporosis 03/31/2016  .  Fatty liver determined by biopsy 09/17/2015  . Parotid swelling 09/13/2015  . Urticaria 09/13/2015  . History of CVA (cerebrovascular accident) 08/21/2015  . Senile purpura ([place]) 08/21/2015  . Atrial tachycardia ([place]) 08/13/2015  . Eczema 07/10/2015  . Allergic rhinitis 06/26/2015  . Anxiety 04/23/2015  . Edema leg 03/27/2015  . Generalized weakness 03/06/2015  . Primary cardiomyopathy ([place]) 03/05/2015  . CAD (coronary artery disease) 03/05/2015  . Benign essential HTN 02/28/2015  . Combined fat and carbohydrate induced hyperlipemia 09/06/2014  . Acid reflux 08/01/2014    1. Obstructive chronic bronchitis without exacerbation ([place]) Moderate disease, will get annual PFT.  Continue to use inhalers as prescribed.  - Pulmonary Function Test; Future  2. Seasonal allergic rhinitis due to pollen Controlled, continue present mgmt.  3. Gastroesophageal reflux disease without esophagitis Continue prilosec.  4. Primary Parkinsonism ([place]) Stable, continue to follow  With neurology.   5. SOB (shortness of breath) Stable FEV1 - Spirometry with Graph  General Counseling: I have discussed the findings of the evaluation and examination with [place].  I have also discussed any further diagnostic evaluation thatmay be needed or ordered today. Quadasia verbalizes understanding of the findings of todays visit. We also reviewed her medications today and discussed drug interactions and side effects including but not limited  excessive drowsiness and altered mental states. We also discussed that there is always a risk not just to her but also people around her. she has been encouraged to call the office with any questions or concerns that should arise related to todays visit.  Orders Placed This Encounter  Procedures  . Spirometry with Graph    Order Specific Question:   Where should this test be performed?    Answer:   [facility]     Time spent: 25 This patient was seen by Orson Gear AGNP-C in Collaboration with Dr. Devona Konig as a part of collaborative care agreement.   I have personally obtained a history, examined the patient, evaluated laboratory and imaging results, formulated the assessment and plan and placed orders.    Allyne Gee, MD [facility] Pulmonary and Critical Care Sleep medicine

## 2020-01-22 NOTE — Assessment & Plan Note (Signed)
Low BP today Now off of all antihypertensives Continue to monitor

## 2020-01-22 NOTE — Assessment & Plan Note (Signed)
Recheck CMP Continue supplement

## 2020-01-22 NOTE — Progress Notes (Signed)
Patient: Lori Brady, Female    DOB: 07/08/1946, 74 y.o.   MRN: [id number] Visit Date: 01/22/2020  Today's Provider: [name], MD   Chief Complaint  Patient presents with  . Annual Exam   Subjective:    I Lori Brady, CMA, am acting as scribe for [name], MD.   Complete Physical Lori Brady is a 74 y.o. female. She feels well. She reports exercising no. She reports she is sleeping well. 01/22/2020 AWV  -----------------------------------------------------------   Review of Systems  Constitutional: Positive for activity change.  HENT: Positive for congestion, hearing loss and voice change.   Eyes: Negative.   Respiratory: Positive for cough and wheezing.   Cardiovascular: Negative.   Gastrointestinal: Negative.   Endocrine: Negative.   Genitourinary: Negative.   Musculoskeletal: Negative.   Skin: Negative.   Allergic/Immunologic: Negative.   Neurological: Positive for tremors and weakness.  Hematological: Bruises/bleeds easily.  Psychiatric/Behavioral: Negative.     Social History   Socioeconomic History  . Marital status: Widowed    Spouse name: Not on file  . Number of children: 3  . Years of education: Not on file  . Highest education level: Some college, no degree  Occupational History  . Occupation: retired  Tobacco Use  . Smoking status: Former Smoker    Packs/day: 1.00    Years: 30.00    Pack years: 30.00    Types: Cigarettes    Quit date: 08/01/2008    Years since quitting: 11.4  . Smokeless tobacco: Never Used  Substance and Sexual Activity  . Alcohol use: Never  . Drug use: No  . Sexual activity: Not on file  Other Topics Concern  . Not on file  Social History Narrative  . Not on file   Social Determinants of Health   Financial Resource Strain: Low Risk   . Difficulty of Paying Living Expenses: Not hard at all  Food Insecurity: No Food Insecurity  . Worried About Charity fundraiser in the Last Year:  Never true  . Ran Out of Food in the Last Year: Never true  Transportation Needs: No Transportation Needs  . Lack of Transportation (Medical): No  . Lack of Transportation (Non-Medical): No  Physical Activity: Inactive  . Days of Exercise per Week: 0 days  . Minutes of Exercise per Session: 0 min  Stress: No Stress Concern Present  . Feeling of Stress : Not at all  Social Connections: Moderately Isolated  . Frequency of Communication with Friends and Family: More than three times a week  . Frequency of Social Gatherings with Friends and Family: Three times a week  . Attends Religious Services: Never  . Active Member of Clubs or Organizations: No  . Attends Archivist Meetings: Never  . Marital Status: Widowed  Intimate Partner Violence: Not At Risk  . Fear of Current or Ex-Partner: No  . Emotionally Abused: No  . Physically Abused: No  . Sexually Abused: No    Past Medical History:  Diagnosis Date  . Arthritis   . Asthma   . Crohn's disease ([place])   . Emphysema of lung ([place])   . GERD (gastroesophageal reflux disease)   . Hip fracture ([place]) 01/26/2017  . History of chicken pox   . History of heart attack   . Hx of completed stroke   . Hypercholesteremia   . Hypertension   . Parkinson disease ([place])   . Pneumonia   . Seasonal  allergies   . Sepsis ([place])   . Septic shock ([place]) 10/08/2016  . UTI (lower urinary tract infection)      Patient Active Problem List   Diagnosis Date Noted  . Crohn's disease of colon without complication ([place]) 38/93/7342  . Hearing loss 06/01/2019  . Vertigo 06/01/2019  . Hypokalemia 07/22/2018  . Oral thrush 07/22/2018  . Gastroenteritis 07/16/2018  . Primary Parkinsonism ([place]) 01/05/2018  . SIADH (syndrome of inappropriate ADH production) ([place]) 10/01/2017  . Spinal stenosis 10/01/2017  . Elevated troponin I level 11/18/2016  . COPD (chronic obstructive pulmonary disease) with chronic bronchitis ([place]) 08/12/2016  . Asthma without  status asthmaticus 03/31/2016  . Osteoporosis 03/31/2016  . Fatty liver determined by biopsy 09/17/2015  . Parotid swelling 09/13/2015  . Urticaria 09/13/2015  . History of CVA (cerebrovascular accident) 08/21/2015  . Senile purpura ([place]) 08/21/2015  . Atrial tachycardia ([place]) 08/13/2015  . Eczema 07/10/2015  . Allergic rhinitis 06/26/2015  . Anxiety 04/23/2015  . Edema leg 03/27/2015  . Generalized weakness 03/06/2015  . Primary cardiomyopathy ([place]) 03/05/2015  . CAD (coronary artery disease) 03/05/2015  . Benign essential HTN 02/28/2015  . Combined fat and carbohydrate induced hyperlipemia 09/06/2014  . Acid reflux 08/01/2014    Past Surgical History:  Procedure Laterality Date  . BREAST BIOPSY Bilateral 1960's to 90's  . breast cysts    . CATARACT EXTRACTION, BILATERAL    . ELBOW SURGERY     right  . right wrist fracture    . TUBAL LIGATION    . WRIST SURGERY Left     Her family history includes Breast cancer (age of onset: 78) in her sister; Breast cancer (age of onset: 19) in her sister; Cancer in her brother, father, sister, and sister; Diabetes in her maternal grandmother; Heart attack in her brother; Heart disease in her brother, father, sister, and sister; Pancreatic cancer in her mother.   Current Outpatient Medications:  .  acetaminophen (TYLENOL) 500 MG tablet, Take 500 mg by mouth every 6 (six) hours as needed. , Disp: , Rfl:  .  albuterol (ACCUNEB) 0.63 MG/3ML nebulizer solution, Take 1 ampule by nebulization every 6 (six) hours as needed for wheezing., Disp: , Rfl:  .  albuterol (VENTOLIN HFA) 108 (90 Base) MCG/ACT inhaler, INHALE 2 PUFFS INTO THE LUNGS EVERY 6 HOURS AS NEEDED FOR WHEEZING ORSHORTNESS OF BREATH, Disp: 8.5 g, Rfl: 2 .  alendronate (FOSAMAX) 70 MG tablet, One tab every Sunday on empty stomach with a full glass of water. Do not lie down or eat/drink anything else for the next 30 min., Disp: , Rfl:  .  aspirin 81 MG tablet, Take 81 mg by mouth  daily., Disp: , Rfl:  .  atorvastatin (LIPITOR) 40 MG tablet, TAKE 1 TABLET BY MOUTH AT BEDTIME **STOPSIMVASTATIN**, Disp: 90 tablet, Rfl: 1 .  betamethasone valerate (VALISONE) 0.1 % cream, APPLY TOPICALLY TO AFFECTED AREA SPARINGLY 2 TIMES DAILY., Disp: 15 g, Rfl: 5 .  BREO ELLIPTA 100-25 MCG/INH AEPB, INHALE 1 PUFF BY MOUTH ONCE DAILY AS DIRECTED, Disp: 180 each, Rfl: 1 .  carbidopa-levodopa (SINEMET CR) 50-200 MG tablet, Take 1 tablet by mouth daily. Before dose, Disp: , Rfl:  .  carbidopa-levodopa (SINEMET IR) 25-100 MG tablet, Take 2 tablets by mouth 3 (three) times daily. , Disp: , Rfl:  .  cetirizine (ZYRTEC) 10 MG tablet, Take 1 tablet (10 mg total) by mouth daily., Disp: 90 tablet, Rfl: 3 .  Cholecalciferol (VITAMIN D) 2000 UNITS CAPS,  Take 1 capsule by mouth daily., Disp: , Rfl:  .  citalopram (CELEXA) 20 MG tablet, TAKE 1 TABLET BY MOUTH ONCE DAILY, Disp: 90 tablet, Rfl: 0 .  clobetasol (TEMOVATE) 0.05 % external solution, Apply 1 application topically 2 (two) times daily. As needed, Disp: , Rfl:  .  clobetasol cream (TEMOVATE) 6.38 %, Apply 1 application topically 2 (two) times daily. As needed, Disp: , Rfl:  .  doxepin (SINEQUAN) 25 MG capsule, Take 25 mg by mouth at bedtime. , Disp: , Rfl:  .  fluticasone (FLONASE) 50 MCG/ACT nasal spray, PLACE TO SPRAYS INTO BOTHS NOSTRILS DAILY, Disp: 16 g, Rfl: 6 .  ibuprofen (ADVIL) 600 MG tablet, ibuprofen 600 mg tablet  Take 1 tablet 3 times a day by oral route., Disp: , Rfl:  .  ketoconazole (NIZORAL) 2 % shampoo, Apply 1 application topically 2 (two) times a week., Disp: , Rfl:  .  montelukast (SINGULAIR) 10 MG tablet, TAKE 1 TABLET BY MOUTH ONCE DAILY, Disp: 90 tablet, Rfl: 1 .  nystatin (MYCOSTATIN) 100000 UNIT/ML suspension, Take 5 mLs (500,000 Units total) by mouth 4 (four) times daily. Until resolution, Disp: 300 mL, Rfl: 0 .  omeprazole (PRILOSEC) 40 MG capsule, Take 40 mg by mouth daily. , Disp: , Rfl:  .  ondansetron (ZOFRAN ODT) 4 MG  disintegrating tablet, Take 1 tablet (4 mg total) by mouth every 8 (eight) hours as needed., Disp: 15 tablet, Rfl: 0 .  potassium chloride SA (K-DUR) 20 MEQ tablet, TAKE 1 TABLET BY MOUTH ONCE DAILY, Disp: 60 tablet, Rfl: 5 .  Tiotropium Bromide Monohydrate (SPIRIVA RESPIMAT) 2.5 MCG/ACT AERS, Use 2 puffs daily for copd, Disp: 12 g, Rfl: 1 .  benzonatate (TESSALON) 100 MG capsule, Take 1 capsule (100 mg total) by mouth 2 (two) times daily as needed for cough. (Patient not taking: Reported on 01/22/2020), Disp: 20 capsule, Rfl: 0 .  lisinopril (ZESTRIL) 10 MG tablet, Take 10 mg by mouth daily. , Disp: , Rfl:  .  ondansetron (ZOFRAN) 4 MG tablet, Take 1 tablet (4 mg total) by mouth every 8 (eight) hours as needed for nausea or vomiting., Disp: 20 tablet, Rfl: 1  Patient Care Team: Virginia Crews, MD as PCP - General (Family Medicine) Corey Skains, MD as Consulting Physician (Cardiology) Jannet Mantis, MD as Consulting Physician (Dermatology) Allyne Gee, MD as Consulting Physician (Internal Medicine) Katy Apo, MD as Consulting Physician (Ophthalmology) Gabriel Carina, Betsey Holiday, MD as Physician Assistant (Endocrinology) Vladimir Crofts, MD as Consulting Physician (Neurology) Trinna Post, PA-C as Physician Assistant (Physician Assistant) Rutherford Limerick, MD as Attending Physician (Orthopedic Surgery)     Objective:    Vitals: BP 99/67 (BP Location: Left Arm, Patient Position: Sitting, Cuff Size: Normal)   Pulse 88   Temp (!) 96.9 F (36.1 C) (Temporal)   Resp 16   Ht 5' 1"  (1.549 m)   Wt 144 lb (65.3 kg)   BMI 27.21 kg/m   Physical Exam Vitals reviewed.  Constitutional:      General: She is not in acute distress.    Appearance: Normal appearance. She is well-developed. She is not diaphoretic.  HENT:     Head: Normocephalic and atraumatic.     Right Ear: Tympanic membrane, ear canal and external ear normal.     Left Ear: Tympanic membrane, ear canal and  external ear normal.  Eyes:     General: No scleral icterus.    Conjunctiva/sclera: Conjunctivae normal.  Pupils: Pupils are equal, round, and reactive to light.  Neck:     Thyroid: No thyromegaly.  Cardiovascular:     Rate and Rhythm: Normal rate and regular rhythm.     Pulses: Normal pulses.     Heart sounds: Normal heart sounds. No murmur.  Pulmonary:     Effort: Pulmonary effort is normal. No respiratory distress.     Breath sounds: Normal breath sounds. No wheezing or rales.  Abdominal:     General: There is no distension.     Palpations: Abdomen is soft.     Tenderness: There is no abdominal tenderness.  Musculoskeletal:        General: No deformity.     Cervical back: Neck supple.     Right lower leg: No edema.     Left lower leg: No edema.  Lymphadenopathy:     Cervical: No cervical adenopathy.  Skin:    General: Skin is warm and dry.     Capillary Refill: Capillary refill takes less than 2 seconds.     Findings: No rash.  Neurological:     Mental Status: She is alert and oriented to person, place, and time. Mental status is at baseline.     Comments: No resting tremor, walks with walker.  Psychiatric:        Mood and Affect: Mood normal.        Behavior: Behavior normal.        Thought Content: Thought content normal.     Activities of Daily Living In your present state of health, do you have any difficulty performing the following activities: 01/22/2020 10/31/2019  Hearing? Y Y  Comment Deaf in left ear. Does not wear hearing aids. Does not require hearing device/Reports difficulty hearing in right ear.  Vision? N N  Difficulty concentrating or making decisions? N N  Walking or climbing stairs? Y Y  Comment Due to mobility issues. Currently primarily wheelchair bound due injury to both arms./Family assisting  Dressing or bathing? N Y  Comment - Due to recent injury to both arms./Family assisting  Doing errands, shopping? Tempie Donning  Comment Currently does not  drive. Family Risk analyst and eating ? N -  Using the Toilet? N -  In the past six months, have you accidently leaked urine? N -  Do you have problems with loss of bowel control? N -  Managing your Medications? N -  Managing your Finances? Y -  Comment Sister manages finances. -  Housekeeping or managing your Housekeeping? Y -  Comment Sister does the housework. -  Some recent data might be hidden    Fall Risk Assessment Fall Risk  01/22/2020 10/31/2019 10/31/2019 01/20/2019 12/08/2018  Falls in the past year? 1 - 1 1 0  Comment - - - - -  Number falls in past yr: 1 - 1 0 0  Comment - - - "passed out" -  Injury with Fall? 1 - 1 0 0  Comment - - - - -  Risk Factor Category  - - - - -  Risk for fall due to : Impaired mobility;Impaired balance/gait Impaired balance/gait;Medication side effect;Orthopedic patient;Other (Comment);History of fall(s) - Medication side effect;Other (Comment) -  Risk for fall due to: Comment Due to Parkinsons. - - illness -  Follow up Falls prevention discussed Falls prevention discussed Falls prevention discussed - -     Depression Screen PHQ 2/9 Scores 01/22/2020 01/20/2019 06/06/2018 01/04/2018  PHQ - 2 Score 0 1  0 1  PHQ- 9 Score - - - -    6CIT Screen 01/22/2020  What Year? 0 points  What month? 0 points  What time? 0 points  Count back from 20 0 points  Months in reverse 0 points  Repeat phrase 0 points  Total Score 0   Audit-C Alcohol Use Screening   Alcohol Use Disorder Test (AUDIT) 06/06/2018 07/08/2018 12/08/2018 01/22/2020 01/22/2020  1. How often do you have a drink containing alcohol? 1 1 1  0 2  2. How many drinks containing alcohol do you have on a typical day when you are drinking? 0 0 0 0 0  3. How often do you have six or more drinks on one occasion? 0 0 0 0 0  AUDIT-C Score 1 1 1  0 2  Alcohol Brief Interventions/Follow-up - - - - AUDIT Score <7 follow-up not indicated    A score of 3 or more in women, and 4 or more in men  indicates increased risk for alcohol abuse, EXCEPT if all of the points are from question 1  6CIT Screen 01/22/2020  What Year? 0 points  What month? 0 points  What time? 0 points  Count back from 20 0 points  Months in reverse 0 points  Repeat phrase 0 points  Total Score 0      Assessment & Plan:    Annual Physical Reviewed patient's Family Medical History Reviewed and updated list of patient's medical providers Assessment of cognitive impairment was done Assessed patient's functional ability Established a written schedule for health screening [place] Completed and Reviewed  Exercise Activities and Dietary recommendations Goals    . Improve Ability to Self-Manage Chronic Conditions    . LIFESTYLE - DECREASE FALLS RISK     Recommend to remove any items from the home that may cause slips or trips.    . Weight (lb) < 130 lb (59 kg)     Recommend eating 3 small meals with two healthy protein snacks in between.        Immunization History  Administered Date(s) Administered  . Fluad Quad(high Dose 65+) 09/05/2019  . Influenza Split 10/28/2011, 08/10/2012  . Influenza, High Dose Seasonal PF 08/21/2014, 08/29/2015  . Influenza,inj,Quad PF,6+ Mos 08/16/2013  . Influenza-Unspecified 08/16/2014, 08/11/2018  . Pneumococcal Conjugate-13 01/10/2015  . Pneumococcal Polysaccharide-23 05/29/2013  . Tdap 04/01/2017  . Zoster 08/10/2012    Health Maintenance  Topic Date Due  . MAMMOGRAM  06/17/2018  . COLONOSCOPY  04/21/2020  . DEXA SCAN  12/29/2020  . TETANUS/TDAP  04/02/2027  . INFLUENZA VACCINE  Completed  . Hepatitis C Screening  Completed  . PNA vac Low Risk Adult  Completed     Discussed health benefits of physical activity, and encouraged her to engage in regular exercise appropriate for her age and condition.    ------------------------------------------------------------------------------------------------------------  Problem List Items  Addressed This Visit      Cardiovascular and Mediastinum   Benign essential HTN    Low BP today Now off of all antihypertensives Continue to monitor      Relevant Orders   Comprehensive metabolic panel   Primary cardiomyopathy ([place])    History of Takotsubo stress-induced in 2009 Followed by Cardiology BP well controlled currnetly on statin      Relevant Orders   Lipid panel   Senile purpura ([place])    Chronic and stable        Digestive   Crohn's disease of colon without complication ([place])  No recent flares Referral to GI - Dr Vicente Males for f/u per request Needs colonoscopy later this year      Relevant Orders   Ambulatory referral to Gastroenterology   CBC w/Diff/Platelet     Nervous and Auditory   Primary Parkinsonism ([place])    Stable No medication changes Encouraged f/u with Neurology Refilled doxepin        Other   Hypokalemia    Recheck CMP Continue supplement      Relevant Orders   Comprehensive metabolic panel    Other Visit Diagnoses    Encounter for annual physical exam    -  Primary   Relevant Orders   Comprehensive metabolic panel   Lipid panel   CBC w/Diff/Platelet       Return in about 6 months (around 07/24/2020) for chronic disease f/u.   The entirety of the information documented in the History of Present Illness, Review of Systems and Physical Exam were personally obtained by me. Portions of this information were initially documented by Lynford Humphrey, CMA and reviewed by me for thoroughness and accuracy.    Braylyn Eye, Dionne Bucy, MD MPH [facility]

## 2020-01-22 NOTE — Assessment & Plan Note (Signed)
No recent flares Referral to GI - Dr Vicente Males for f/u per request Needs colonoscopy later this year

## 2020-01-22 NOTE — Patient Instructions (Signed)
Preventive Care 38 Years and Older, Female Preventive care refers to lifestyle choices and visits with your health care provider that can promote health and wellness. This includes:  A yearly physical exam. This is also called an annual well check.  Regular dental and eye exams.  Immunizations.  Screening for certain conditions.  Healthy lifestyle choices, such as diet and exercise. What can I expect for my preventive care visit? Physical exam Your health care provider will check:  Height and weight. These may be used to calculate body mass index (BMI), which is a measurement that tells if you are at a healthy weight.  Heart rate and blood pressure.  Your skin for abnormal spots. Counseling Your health care provider may ask you questions about:  Alcohol, tobacco, and drug use.  Emotional well-being.  Home and relationship well-being.  Sexual activity.  Eating habits.  History of falls.  Memory and ability to understand (cognition).  Work and work Statistician.  Pregnancy and menstrual history. What immunizations do I need?  Influenza (flu) vaccine  This is recommended every year. Tetanus, diphtheria, and pertussis (Tdap) vaccine  You may need a Td booster every 10 years. Varicella (chickenpox) vaccine  You may need this vaccine if you have not already been vaccinated. Zoster (shingles) vaccine  You may need this after age 33. Pneumococcal conjugate (PCV13) vaccine  One dose is recommended after age 33. Pneumococcal polysaccharide (PPSV23) vaccine  One dose is recommended after age 72. Measles, mumps, and rubella (MMR) vaccine  You may need at least one dose of MMR if you were born in 1957 or later. You may also need a second dose. Meningococcal conjugate (MenACWY) vaccine  You may need this if you have certain conditions. Hepatitis A vaccine  You may need this if you have certain conditions or if you travel or work in places where you may be exposed  to hepatitis A. Hepatitis B vaccine  You may need this if you have certain conditions or if you travel or work in places where you may be exposed to hepatitis B. Haemophilus influenzae type b (Hib) vaccine  You may need this if you have certain conditions. You may receive vaccines as individual doses or as more than one vaccine together in one shot (combination vaccines). Talk with your health care provider about the risks and benefits of combination vaccines. What tests do I need? Blood tests  Lipid and cholesterol levels. These may be checked every 5 years, or more frequently depending on your overall health.  Hepatitis C test.  Hepatitis B test. Screening  Lung cancer screening. You may have this screening every year starting at age 39 if you have a 30-pack-year history of smoking and currently smoke or have quit within the past 15 years.  Colorectal cancer screening. All adults should have this screening starting at age 36 and continuing until age 15. Your health care provider may recommend screening at age 23 if you are at increased risk. You will have tests every 1-10 years, depending on your results and the type of screening test.  Diabetes screening. This is done by checking your blood sugar (glucose) after you have not eaten for a while (fasting). You may have this done every 1-3 years.  Mammogram. This may be done every 1-2 years. Talk with your health care provider about how often you should have regular mammograms.  BRCA-related cancer screening. This may be done if you have a family history of breast, ovarian, tubal, or peritoneal cancers.  Other tests  Sexually transmitted disease (STD) testing.  Bone density scan. This is done to screen for osteoporosis. You may have this done starting at age 21. Follow these instructions at home: Eating and drinking  Eat a diet that includes fresh fruits and vegetables, whole grains, lean protein, and low-fat dairy products. Limit  your intake of foods with high amounts of sugar, saturated fats, and salt.  Take vitamin and mineral supplements as recommended by your health care provider.  Do not drink alcohol if your health care provider tells you not to drink.  If you drink alcohol: ? Limit how much you have to 0-1 drink a day. ? Be aware of how much alcohol is in your drink. In the [place] one drink equals one 12 oz bottle of beer (355 mL), one 5 oz glass of wine (148 mL), or one 1 oz glass of hard liquor (44 mL). Lifestyle  Take daily care of your teeth and gums.  Stay active. Exercise for at least 30 minutes on 5 or more days each week.  Do not use any products that contain nicotine or tobacco, such as cigarettes, e-cigarettes, and chewing tobacco. If you need help quitting, ask your health care provider.  If you are sexually active, practice safe sex. Use a condom or other form of protection in order to prevent STIs (sexually transmitted infections).  Talk with your health care provider about taking a low-dose aspirin or statin. What's next?  Go to your health care provider once a year for a well check visit.  Ask your health care provider how often you should have your eyes and teeth checked.  Stay up to date on all vaccines. This information is not intended to replace advice given to you by your health care provider. Make sure you discuss any questions you have with your health care provider. Document Revised: 10/27/2018 Document Reviewed: 10/27/2018 Elsevier Patient Education  2020 [employer].

## 2020-01-22 NOTE — Assessment & Plan Note (Signed)
Chronic and stable.   

## 2020-01-23 ENCOUNTER — Telehealth: Payer: Self-pay

## 2020-01-23 LAB — CBC WITH DIFFERENTIAL/PLATELET
Basophils Absolute: 0 10*3/uL (ref 0.0–0.2)
Basos: 0 %
EOS (ABSOLUTE): 0.2 10*3/uL (ref 0.0–0.4)
Eos: 2 %
Hematocrit: 33 % — ABNORMAL LOW (ref 34.0–46.6)
Hemoglobin: 10.6 g/dL — ABNORMAL LOW (ref 11.1–15.9)
Immature Grans (Abs): 0.1 10*3/uL (ref 0.0–0.1)
Immature Granulocytes: 1 %
Lymphocytes Absolute: 2.5 10*3/uL (ref 0.7–3.1)
Lymphs: 28 %
MCH: 27.6 pg (ref 26.6–33.0)
MCHC: 32.1 g/dL (ref 31.5–35.7)
MCV: 86 fL (ref 79–97)
Monocytes Absolute: 0.8 10*3/uL (ref 0.1–0.9)
Monocytes: 9 %
Neutrophils Absolute: 5.4 10*3/uL (ref 1.4–7.0)
Neutrophils: 60 %
Platelets: 190 10*3/uL (ref 150–450)
RBC: 3.84 x10E6/uL (ref 3.77–5.28)
RDW: 13.5 % (ref 11.7–15.4)
WBC: 8.9 10*3/uL (ref 3.4–10.8)

## 2020-01-23 LAB — COMPREHENSIVE METABOLIC PANEL
ALT: 15 IU/L (ref 0–32)
AST: 25 IU/L (ref 0–40)
Albumin/Globulin Ratio: 1.5 (ref 1.2–2.2)
Albumin: 4.6 g/dL (ref 3.7–4.7)
Alkaline Phosphatase: 143 IU/L — ABNORMAL HIGH (ref 39–117)
BUN/Creatinine Ratio: 19 (ref 12–28)
BUN: 15 mg/dL (ref 8–27)
Bilirubin Total: 0.6 mg/dL (ref 0.0–1.2)
CO2: 19 mmol/L — ABNORMAL LOW (ref 20–29)
Calcium: 9.7 mg/dL (ref 8.7–10.3)
Chloride: 100 mmol/L (ref 96–106)
Creatinine, Ser: 0.81 mg/dL (ref 0.57–1.00)
GFR calc Af Amer: 85 mL/min/{1.73_m2} (ref >59)
GFR calc non Af Amer: 73 mL/min/{1.73_m2} (ref >59)
Globulin, Total: 3 g/dL (ref 1.5–4.5)
Glucose: 105 mg/dL — ABNORMAL HIGH (ref 65–99)
Potassium: 4.5 mmol/L (ref 3.5–5.2)
Sodium: 140 mmol/L (ref 134–144)
Total Protein: 7.6 g/dL (ref 6.0–8.5)

## 2020-01-23 LAB — LIPID PANEL
Chol/HDL Ratio: 2.7 ratio (ref 0.0–4.4)
Cholesterol, Total: 145 mg/dL (ref 100–199)
HDL: 54 mg/dL (ref >39)
LDL Chol Calc (NIH): 65 mg/dL (ref 0–99)
Triglycerides: 150 mg/dL — ABNORMAL HIGH (ref 0–149)
VLDL Cholesterol Cal: 26 mg/dL (ref 5–40)

## 2020-01-23 NOTE — Telephone Encounter (Signed)
-----  Message from Virginia Crews, MD sent at 01/23/2020 11:04 AM EST ----- Normal labs, except elevated alk phos and anemia.  She is already being referred to follow-up with Dr. Vicente Males for her Crohn's disease, so repeat colonoscopy may be needed to evaluate anemia further.  He can also review her liver function test and determine if she needs additional testing for that.  Can we try to add on iron panel, B12, folate to her labs to evaluate the anemia a little bit further.

## 2020-01-23 NOTE — Telephone Encounter (Signed)
Patient advised as below. Additional test were added.

## 2020-01-24 ENCOUNTER — Telehealth: Payer: Self-pay

## 2020-01-24 NOTE — Telephone Encounter (Signed)
-----   Message from Virginia Crews, MD sent at 01/24/2020  4:03 PM EST ----- Normal folate and B12.  Iron is low side of normal.  Be sure to discuss with GI at upcoming appt

## 2020-01-24 NOTE — Telephone Encounter (Signed)
Pt advised.   Thanks,   -Jamice Carreno  

## 2020-01-25 ENCOUNTER — Encounter: Payer: Self-pay | Admitting: *Deleted

## 2020-01-25 LAB — SPECIMEN STATUS REPORT

## 2020-01-25 LAB — B12 AND FOLATE PANEL
Folate: 13 ng/mL (ref >3.0)
Vitamin B-12: 300 pg/mL (ref 232–1245)

## 2020-01-25 LAB — IRON: Iron: 31 ug/dL (ref 27–139)

## 2020-01-30 ENCOUNTER — Telehealth: Payer: Self-pay

## 2020-02-01 ENCOUNTER — Ambulatory Visit: Payer: Medicare PPO | Attending: Internal Medicine

## 2020-02-01 DIAGNOSIS — Z23 Encounter for immunization: Secondary | ICD-10-CM

## 2020-02-01 NOTE — Progress Notes (Signed)
   Covid-19 Vaccination Clinic  Name:  Lori Brady    MRN: [id number] DOB: 10-10-1946  02/01/2020  Lori Brady was observed post Covid-19 immunization for 15 minutes without incident. She was provided with Vaccine Information Sheet and instruction to access the V-Safe system.   Lori Brady was instructed to call 911 with any severe reactions post vaccine: Marland Kitchen Difficulty breathing  . Swelling of face and throat  . A fast heartbeat  . A bad rash all over body  . Dizziness and weakness   Immunizations Administered    Name Date Dose VIS Date Route   Pfizer COVID-19 Vaccine 02/01/2020 10:50 AM 0.3 mL 10/27/2019 Intramuscular   Manufacturer: [place]   Lot: [id number]   [place]: [id number]

## 2020-02-07 ENCOUNTER — Other Ambulatory Visit: Payer: Self-pay

## 2020-02-07 ENCOUNTER — Ambulatory Visit: Payer: Medicare PPO | Admitting: Internal Medicine

## 2020-02-07 DIAGNOSIS — R0602 Shortness of breath: Secondary | ICD-10-CM

## 2020-02-07 LAB — PULMONARY FUNCTION TEST

## 2020-02-16 ENCOUNTER — Other Ambulatory Visit: Payer: Self-pay | Admitting: Family Medicine

## 2020-02-16 DIAGNOSIS — F419 Anxiety disorder, unspecified: Secondary | ICD-10-CM

## 2020-02-20 ENCOUNTER — Telehealth: Payer: Self-pay

## 2020-02-20 ENCOUNTER — Ambulatory Visit: Payer: Self-pay

## 2020-02-20 NOTE — Chronic Care Management (AMB) (Signed)
  Chronic Care Management   Outreach Note  02/20/2020 Name: ALKA FALWELL MRN: [id number] DOB: 1946/03/07  Primary Care Provider: Virginia Crews, MD Reason for referral : Chronic Care Manager    An unsuccessful telephone outreach was attempted today. Ms. Fiebelkorn is engaged with the chronic care management program. A follow-up outreach was attempted today.    Follow Up Plan: The care management team will reach out again next week.   Horris Latino [facility] Care Management [phone]

## 2020-02-21 NOTE — Procedures (Signed)
[facility] [place], [id number]  DATE OF SERVICE: February 07, 2020  Complete Pulmonary Function Testing Interpretation:  FINDINGS:  The forced vital capacity is mildly decreased.  FEV1 FVC ratio is mildly decreased.  FEV1 is 1.28 L which is 66% of predicted and is mildly decreased.  Postbronchodilator there does not appear to be significant improvement in the FEV1 however clinical improvement may still occur in the absence of spirometric improvement.  Total lung capacity is normal residual volume is increased residual internal opacity ratio is increased.  DLCO is moderately decreased.  DLCO is normal corrected for alveolar volume  IMPRESSION:  This pulmonary function study is consistent with mild obstructive lung disease.  No significant response to bronchodilator and DLCO is moderately decreased.  Allyne Gee, MD [facility] Pulmonary Critical Care Medicine Sleep Medicine

## 2020-02-27 ENCOUNTER — Ambulatory Visit: Payer: Medicare PPO | Attending: Internal Medicine

## 2020-02-27 DIAGNOSIS — Z23 Encounter for immunization: Secondary | ICD-10-CM

## 2020-02-27 NOTE — Progress Notes (Signed)
   Covid-19 Vaccination Clinic  Name:  Lori Brady    MRN: [id number] DOB: 1945/11/23  02/27/2020  Lori Brady was observed post Covid-19 immunization for 15 minutes without incident. She was provided with Vaccine Information Sheet and instruction to access the V-Safe system.   Lori Brady was instructed to call 911 with any severe reactions post vaccine: Marland Kitchen Difficulty breathing  . Swelling of face and throat  . A fast heartbeat  . A bad rash all over body  . Dizziness and weakness   Immunizations Administered    Name Date Dose VIS Date Route   Pfizer COVID-19 Vaccine 02/27/2020 10:40 AM 0.3 mL 10/27/2019 Intramuscular   Manufacturer: [place]   Lot: [id number]   [place]: [id number]

## 2020-02-29 ENCOUNTER — Telehealth: Payer: Self-pay

## 2020-03-05 ENCOUNTER — Telehealth: Payer: Self-pay

## 2020-03-05 ENCOUNTER — Ambulatory Visit: Payer: Self-pay

## 2020-03-05 NOTE — Patient Instructions (Addendum)
Thank you for allowing the Chronic Care Management team to participate in your care.  Goals Addressed            This Visit's Progress   . Improve Ability to Self-Manage Chronic Conditions       Current Barriers:  . Chronic Disease Management support and education needs related to Hypertension, COPD, Parkinson's and CAD. (Recent falls resulting in injury to both upper extremities)   Case Manager Clinical Goal(s):  Marland Kitchen Over the next 90 days, patient will not require unexpected hospital admission. . Over the next 90 days, patient will take all medications as prescribed. . Over the next 90 days, patient will notify provider if status changes and with worsening s/sx related to cardiac or respiratory complications. . Over the next 90 days, patient will work with team to determine level of care concerns and possible need for additional assistance in the home. . Over the next 90 days, patient will adhere to recommended safety measures to prevent additional injuries. . Over the next 90 days, patient will attend all medical appointments as schedule. .   Interventions:  . Reviewed medications. Encouraged to take medications as prescribed. Family is currently assisting with medication preparation and administration due patient being unable to use her upper extremities. Denies concerns regarding prescription costs. . Discussed worsening s/sx of complications related to cardiac and respiratory emergencies. Discussed indications for seeking immediate medical attention. Reports being unable to monitor BP or complete routine care d/t injuries to both upper extremities. . Reports productive cough with "clear to white" sputum. Denies shortness of breath or chest discomfort. Verbalized awareness of indications for notifying MD . Reviewed safety measures and provider recommendations regarding mobility. Reports being primarily wheelchair bound since recent injuries. Reports previously using walker or assistive  device. Currently has hard cast to left hand and brace to right elbow. Family members are currently assisting with ADLs. Agreeable to agencies being contacted to provide additional in-home assistance if she qualifies. . Reviewed pending appointments. Encouraged to attend appointments as scheduled to prevent delays in care. Reports son, Remo Lipps is currently providing transportation to appointments. . Discussed plans for ongoing care management and follow up. Provided direct contact information for care management team.  Patient Self Care Activities: (Recent falls resulting in injuries to both upper extremities) . Unable to self administer medications as prescribed . Unable to perform ADLs independently . Unable to perform IADLs independently  Initial goal documentation        Ms. Handler was given information about Chronic Care Management services  including:  1. CCM service includes personalized support from designated clinical staff supervised by her physician, including individualized plan of care and coordination with other care providers 2. 24/7 contact phone numbers for assistance for urgent and routine care needs. 3. Service will only be billed when office clinical staff spend 20 minutes or more in a month to coordinate care. 4. Only one practitioner may furnish and bill the service in a calendar month. 5. The patient may stop CCM services at any time (effective at the end of the month) by phone call to the office staff. 6. The patient will be responsible for cost sharing (co-pay) of up to 20% of the service fee (after annual deductible is met).  Patient agreed to services and verbal consent obtained.     Ms. Zirkle verbalized understanding of the instructions provided during the telephonic outreach today. Declined need for a printed/mailed copy of the instructions. She will require additional information regarding  available agencies that provide in-home services.   The care management  team will follow up next week.   Horris Latino [facility] Care Management [phone]

## 2020-03-05 NOTE — Chronic Care Management (AMB) (Signed)
  Chronic Care Management   Outreach Note  03/05/2020 Name: NINAH MOCCIO MRN: [id number] DOB: 1946/05/11  Primary Care Provider: Virginia Crews, MD Reason for referral : Chronic Care Management   An unsuccessful telephone outreach was attempted today. Ms. Boch is engaged with the chronic care management team. Another routine outreach was attempted today.  Left a HIPAA compliant voice message requesting return call.      Follow Up Plan: The care management team will reach out again within the next two weeks.   [facility] [phone]

## 2020-03-19 ENCOUNTER — Other Ambulatory Visit: Payer: Self-pay | Admitting: Family Medicine

## 2020-03-19 ENCOUNTER — Telehealth: Payer: Self-pay

## 2020-03-19 ENCOUNTER — Ambulatory Visit: Payer: Self-pay

## 2020-03-19 DIAGNOSIS — E7800 Pure hypercholesterolemia, unspecified: Secondary | ICD-10-CM

## 2020-03-19 NOTE — Telephone Encounter (Signed)
Requested Prescriptions  Pending Prescriptions Disp Refills  . atorvastatin (LIPITOR) 40 MG tablet [Pharmacy Med Name: ATORVASTATIN CALCIUM 40 MG TAB] 90 tablet 1    Sig: TAKE 1 TABLET BY MOUTH AT BEDTIME     Cardiovascular:  Antilipid - Statins Failed - 03/19/2020  2:53 PM      Failed - Triglycerides in normal range and within 360 days    Triglycerides  Date Value Ref Range Status  01/22/2020 150 (H) 0 - 149 mg/dL Final  12/07/2011 71 0 - 200 mg/dL Final         Passed - Total Cholesterol in normal range and within 360 days    Cholesterol, Total  Date Value Ref Range Status  01/22/2020 145 100 - 199 mg/dL Final   Cholesterol  Date Value Ref Range Status  12/07/2011 133 0 - 200 mg/dL Final         Passed - LDL in normal range and within 360 days    Ldl Cholesterol, Calc  Date Value Ref Range Status  12/07/2011 63 0 - 100 mg/dL Final   LDL Chol Calc (NIH)  Date Value Ref Range Status  01/22/2020 65 0 - 99 mg/dL Final         Passed - HDL in normal range and within 360 days    HDL Cholesterol  Date Value Ref Range Status  12/07/2011 56 40 - 60 mg/dL Final   HDL  Date Value Ref Range Status  01/22/2020 54 >39 mg/dL Final         Passed - Patient is not pregnant      Passed - Valid encounter within last 12 months    Recent Outpatient Visits          1 month ago Encounter for annual physical exam   [facility], Dionne Bucy, MD   6 months ago Benign essential HTN   [facility] Virginia Crews, MD   9 months ago Benign essential HTN   [facility] [place], Dionne Bucy, MD   11 months ago [place], Wendee Beavers, [place]   1 year ago Annual physical exam   [place], Wendee Beavers, [place]      Future Appointments            In 4 months Bac[place], Dionne Bucy, MD [facility], [place]

## 2020-03-20 NOTE — Chronic Care Management (AMB) (Signed)
This encounter was created in error. Please disregard.

## 2020-03-26 ENCOUNTER — Telehealth: Payer: Self-pay

## 2020-04-23 ENCOUNTER — Other Ambulatory Visit: Payer: Self-pay | Admitting: Internal Medicine

## 2020-04-23 DIAGNOSIS — J301 Allergic rhinitis due to pollen: Secondary | ICD-10-CM | POA: Diagnosis not present

## 2020-04-23 DIAGNOSIS — H903 Sensorineural hearing loss, bilateral: Secondary | ICD-10-CM | POA: Diagnosis not present

## 2020-04-23 DIAGNOSIS — H8103 Meniere's disease, bilateral: Secondary | ICD-10-CM | POA: Diagnosis not present

## 2020-05-15 ENCOUNTER — Other Ambulatory Visit: Payer: Self-pay | Admitting: Otolaryngology

## 2020-05-15 ENCOUNTER — Telehealth: Payer: Self-pay

## 2020-05-15 DIAGNOSIS — H903 Sensorineural hearing loss, bilateral: Secondary | ICD-10-CM | POA: Diagnosis not present

## 2020-05-15 DIAGNOSIS — IMO0001 Reserved for inherently not codable concepts without codable children: Secondary | ICD-10-CM

## 2020-05-15 NOTE — Telephone Encounter (Signed)
Spoke w/ Lori Brady at [facility] and she states that the doctor wanted to know if it was okay to started patient on HCTZ 12.5 or 25 MG for bilateral hearing loss. She reports that the patients right ear is worse than her left ear. Please advise.

## 2020-05-15 NOTE — Telephone Encounter (Signed)
She'll need an OV for surgical clearance. Please have them fax over any necessary paperwork. I do not know what medication dosage she is referring to and how it relates to her hearing loss.

## 2020-05-15 NOTE — Telephone Encounter (Signed)
Copied from [place] [phone]. Topic: General - Inquiry >> May 15, 2020  3:53 PM Alease Frame wrote: Reason for CRM: Sandy from [facility] called in needing a clearance for mutual pt. Pt has some hearing loss and current medication is needing a different dosage. She is wondering if she can give her a dosage for 2.5 or 25 ML. Please reach out to [facility]   If no answer Lovey Newcomer has given permission for a VM to be left with a yes or no for clearance and if so what dosage would be suitable.    Call back [phone] ext [phone]

## 2020-05-16 ENCOUNTER — Other Ambulatory Visit: Payer: Self-pay | Admitting: Family Medicine

## 2020-05-16 DIAGNOSIS — F419 Anxiety disorder, unspecified: Secondary | ICD-10-CM

## 2020-05-16 NOTE — Telephone Encounter (Signed)
[facility] as below.

## 2020-05-16 NOTE — Telephone Encounter (Signed)
Please review. Thanks!  

## 2020-05-16 NOTE — Telephone Encounter (Signed)
Lori Brady, with [facility], returning call. She states she has not heard back and patient is going out of town tomorrow.

## 2020-05-16 NOTE — Telephone Encounter (Signed)
Reviewed her chart. She has SIADH and chronic hyponatremia which is managed by endocrinology. I think it would be best to avoid diuretics at this time which might contribute to further hyponatremia.

## 2020-05-17 ENCOUNTER — Telehealth: Payer: Self-pay | Admitting: Physician Assistant

## 2020-05-17 NOTE — Telephone Encounter (Signed)
No I do not think she should take this.

## 2020-05-17 NOTE — Telephone Encounter (Signed)
I advised her of this yesterday. You do not think the patient should take this, correct? They are asking if she should start HCTZ now and what dose. Please advise. Thanks!

## 2020-05-17 NOTE — Telephone Encounter (Signed)
Lori Brady is caling back Dr. Pryor Ochoa wanted Lori Brady to know that the patient has severe bilateral hearing change. And is still wanting to know can the patient start hctz now 12.5 or 25 MG? CB [phone] And page Lori Brady it is urgent

## 2020-05-17 NOTE — Telephone Encounter (Signed)
Just FYI, no action necessary.   Spoke personally with Ward from ENT. Again advised against HCTZ due to history of SIADH and chronic hyponatremia. Sandy calling on behalf of Dr. Pryor Ochoa who reports patient has severe hearing loss and if this would change the recommendation. Personally, for me it would not but I did suggest reaching out to Dr. Gabriel Carina at [facility] to see if she felt differently in light of the circumstances as she is managing patient for SIADH.

## 2020-05-30 ENCOUNTER — Ambulatory Visit
Admission: RE | Admit: 2020-05-30 | Discharge: 2020-05-30 | Disposition: A | Discharge status: Home / Self Care | Payer: Medicare PPO | Source: Ambulatory Visit | Attending: Otolaryngology | Admitting: Otolaryngology

## 2020-05-30 ENCOUNTER — Other Ambulatory Visit: Payer: Self-pay

## 2020-05-30 DIAGNOSIS — H9041 Sensorineural hearing loss, unilateral, right ear, with unrestricted hearing on the contralateral side: Secondary | ICD-10-CM | POA: Insufficient documentation

## 2020-05-30 DIAGNOSIS — I6782 Cerebral ischemia: Secondary | ICD-10-CM | POA: Diagnosis not present

## 2020-05-30 DIAGNOSIS — I6389 Other cerebral infarction: Secondary | ICD-10-CM | POA: Diagnosis not present

## 2020-05-30 DIAGNOSIS — IMO0001 Reserved for inherently not codable concepts without codable children: Secondary | ICD-10-CM

## 2020-05-30 DIAGNOSIS — J3489 Other specified disorders of nose and nasal sinuses: Secondary | ICD-10-CM | POA: Diagnosis not present

## 2020-05-30 DIAGNOSIS — H9191 Unspecified hearing loss, right ear: Secondary | ICD-10-CM | POA: Diagnosis not present

## 2020-05-30 MED ORDER — GADOBUTROL 1 MMOL/ML IV SOLN
6.0000 mL | Freq: Once | INTRAVENOUS | Status: AC | PRN
  Administered 2020-05-30: 6 mL via INTRAVENOUS

## 2020-06-03 DIAGNOSIS — H903 Sensorineural hearing loss, bilateral: Secondary | ICD-10-CM | POA: Diagnosis not present

## 2020-06-18 ENCOUNTER — Other Ambulatory Visit: Payer: Self-pay | Admitting: Internal Medicine

## 2020-06-18 DIAGNOSIS — J449 Chronic obstructive pulmonary disease, unspecified: Secondary | ICD-10-CM

## 2020-06-18 DIAGNOSIS — J4489 Other specified chronic obstructive pulmonary disease: Secondary | ICD-10-CM

## 2020-06-21 ENCOUNTER — Telehealth: Payer: Self-pay

## 2020-06-21 NOTE — Telephone Encounter (Signed)
Authorization approved for Spiriva Respimat from 11/17/19 through 11/15/2020 SL

## 2020-06-27 DIAGNOSIS — Z808 Family history of malignant neoplasm of other organs or systems: Secondary | ICD-10-CM | POA: Diagnosis not present

## 2020-06-27 DIAGNOSIS — L821 Other seborrheic keratosis: Secondary | ICD-10-CM | POA: Diagnosis not present

## 2020-06-27 DIAGNOSIS — L578 Other skin changes due to chronic exposure to nonionizing radiation: Secondary | ICD-10-CM | POA: Diagnosis not present

## 2020-06-27 DIAGNOSIS — Z86018 Personal history of other benign neoplasm: Secondary | ICD-10-CM | POA: Diagnosis not present

## 2020-06-27 DIAGNOSIS — L570 Actinic keratosis: Secondary | ICD-10-CM | POA: Diagnosis not present

## 2020-07-02 ENCOUNTER — Other Ambulatory Visit: Payer: Self-pay | Admitting: Adult Health

## 2020-07-02 DIAGNOSIS — J301 Allergic rhinitis due to pollen: Secondary | ICD-10-CM

## 2020-07-12 ENCOUNTER — Other Ambulatory Visit: Payer: Self-pay | Admitting: Family Medicine

## 2020-07-12 DIAGNOSIS — Z1231 Encounter for screening mammogram for malignant neoplasm of breast: Secondary | ICD-10-CM

## 2020-07-19 DIAGNOSIS — M25521 Pain in right elbow: Secondary | ICD-10-CM | POA: Diagnosis not present

## 2020-07-23 ENCOUNTER — Ambulatory Visit: Payer: Medicare PPO | Admitting: Internal Medicine

## 2020-07-23 ENCOUNTER — Other Ambulatory Visit: Payer: Self-pay

## 2020-07-23 ENCOUNTER — Encounter: Payer: Self-pay | Admitting: Internal Medicine

## 2020-07-23 DIAGNOSIS — J4489 Other specified chronic obstructive pulmonary disease: Secondary | ICD-10-CM

## 2020-07-23 DIAGNOSIS — J449 Chronic obstructive pulmonary disease, unspecified: Secondary | ICD-10-CM | POA: Diagnosis not present

## 2020-07-23 DIAGNOSIS — Z23 Encounter for immunization: Secondary | ICD-10-CM

## 2020-07-23 DIAGNOSIS — M9741XA Periprosthetic fracture around internal prosthetic right elbow joint, initial encounter: Secondary | ICD-10-CM | POA: Diagnosis not present

## 2020-07-23 DIAGNOSIS — R0602 Shortness of breath: Secondary | ICD-10-CM | POA: Diagnosis not present

## 2020-07-23 DIAGNOSIS — R918 Other nonspecific abnormal finding of lung field: Secondary | ICD-10-CM

## 2020-07-23 NOTE — Progress Notes (Signed)
[facility], [place] [address]  Pulmonary Sleep Medicine   Office Visit Note  Patient Name: Lori Brady DOB: 1946-02-28 MRN [id number]  Date of Service: 07/24/2020  Complaints/HPI: Patient is here for routine pulmonary follow-up.  Lori Brady says her COPD symptoms have been well controlled lately. Lori Brady continues to use her Spiriva and Breo daily with no complications. Using proair infrequently as rescue inhaler. No recent exacerbations or hospitalizations.   ROS  General: (-) fever, (-) chills, (-) night sweats, (-) weakness Skin: (-) rashes, (-) itching,. Eyes: (-) visual changes, (-) redness, (-) itching. Nose and Sinuses: (-) nasal stuffiness or itchiness, (-) postnasal drip, (-) nosebleeds, (-) sinus trouble. Mouth and Throat: (-) sore throat, (-) hoarseness. Neck: (-) swollen glands, (-) enlarged thyroid, (-) neck pain. Respiratory: - cough, (-) bloody sputum, - shortness of breath, - wheezing. Cardiovascular: - ankle swelling, (-) chest pain. Lymphatic: (-) lymph node enlargement. Neurologic: (-) numbness, (-) tingling. Psychiatric: (-) anxiety, (-) depression   Current Medication: Outpatient Encounter Medications as of 07/23/2020  Medication Sig   acetaminophen (TYLENOL) 500 MG tablet Take 500 mg by mouth every 6 (six) hours as needed.    albuterol (ACCUNEB) 0.63 MG/3ML nebulizer solution Take 1 ampule by nebulization every 6 (six) hours as needed for wheezing.   albuterol (VENTOLIN HFA) 108 (90 Base) MCG/ACT inhaler INHALE 2 PUFFS INTO THE LUNGS EVERY 6 HOURS AS NEEDED FOR WHEEZING OR SHORTNESS OF BREATH   alendronate (FOSAMAX) 70 MG tablet One tab every Sunday on empty stomach with a full glass of water. Do not lie down or eat/drink anything else for the next 30 min.   aspirin 81 MG tablet Take 81 mg by mouth daily.   atorvastatin (LIPITOR) 40 MG tablet TAKE 1 TABLET BY MOUTH AT BEDTIME   betamethasone valerate (VALISONE) 0.1 % cream  APPLY TOPICALLY TO AFFECTED AREA SPARINGLY 2 TIMES DAILY.   BREO ELLIPTA 100-25 MCG/INH AEPB INHALE 1 PUFF BY MOUTH ONCE DAILY AS DIRECTED   carbidopa-levodopa (SINEMET CR) 50-200 MG tablet Take 1 tablet by mouth daily. Before dose   cetirizine (ZYRTEC) 10 MG tablet TAKE 1 TABLET BY MOUTH ONCE DAILY   Cholecalciferol (VITAMIN D) 2000 UNITS CAPS Take 1 capsule by mouth daily.   citalopram (CELEXA) 20 MG tablet TAKE 1 TABLET BY MOUTH ONCE DAILY   clobetasol (TEMOVATE) 0.05 % external solution Apply 1 application topically 2 (two) times daily. As needed   clobetasol cream (TEMOVATE) 5.44 % Apply 1 application topically 2 (two) times daily. As needed   doxepin (SINEQUAN) 25 MG capsule TAKE 1 CAPSULE BY MOUTH AT BEDTIME   fluticasone (FLONASE) 50 MCG/ACT nasal spray PLACE TO SPRAYS INTO BOTHS NOSTRILS DAILY   ibuprofen (ADVIL) 600 MG tablet ibuprofen 600 mg tablet  Take 1 tablet 3 times a day by oral route.   ketoconazole (NIZORAL) 2 % shampoo Apply 1 application topically 2 (two) times a week.   ketorolac (TORADOL) 10 MG tablet Take 10 mg by mouth every 4 (four) hours as needed.   montelukast (SINGULAIR) 10 MG tablet TAKE 1 TABLET BY MOUTH ONCE DAILY   nystatin (MYCOSTATIN) 100000 UNIT/ML suspension Take 5 mLs (500,000 Units total) by mouth 4 (four) times daily. Until resolution   omeprazole (PRILOSEC) 40 MG capsule Take 40 mg by mouth daily.    ondansetron (ZOFRAN ODT) 4 MG disintegrating tablet Take 1 tablet (4 mg total) by mouth every 8 (eight) hours as needed.   ondansetron (ZOFRAN) 4  MG tablet Take 1 tablet (4 mg total) by mouth every 8 (eight) hours as needed for nausea or vomiting.   potassium chloride SA (K-DUR) 20 MEQ tablet TAKE 1 TABLET BY MOUTH ONCE DAILY   Tiotropium Bromide Monohydrate (SPIRIVA RESPIMAT) 2.5 MCG/ACT AERS USE 2 PUFFS BY MOUTH ONCE DAILY FOR COPD   carbidopa-levodopa (SINEMET IR) 25-100 MG tablet Take 2 tablets by mouth 3 (three) times daily.    No  facility-administered encounter medications on file as of 07/23/2020.    Surgical History: Past Surgical History:  Procedure Laterality Date   BREAST BIOPSY Bilateral 1960's to 90's   breast cysts     CATARACT EXTRACTION, BILATERAL     ELBOW SURGERY     right   right wrist fracture     TUBAL LIGATION     WRIST SURGERY Left     Medical History: Past Medical History:  Diagnosis Date   Arthritis    Asthma    Crohn's disease ([place])    Emphysema of lung (HCC)    GERD (gastroesophageal reflux disease)    Hip fracture ([place]) 01/26/2017   History of chicken pox    History of heart attack    Hx of completed stroke    Hypercholesteremia    Hypertension    Parkinson disease ([place])    Pneumonia    Seasonal allergies    Sepsis ([place])    Septic shock ([place]) 10/08/2016   UTI (lower urinary tract infection)     Family History: Family History  Problem Relation Age of Onset   Cancer Sister        ovarian/uterine/breast   Breast cancer Sister 70   Cancer Sister        breast   Heart disease Sister    Breast cancer Sister 44   Heart disease Father    Cancer Father        liver   Pancreatic cancer Mother    Heart disease Brother    Heart attack Brother    Cancer Brother        liver   Heart disease Sister    Diabetes Maternal Grandmother     Social History: Social History   Socioeconomic History   Marital status: Widowed    Spouse name: Not on file   Number of children: 3   Years of education: Not on file   Highest education level: Some college, no degree  Occupational History   Occupation: retired  Tobacco Use   Smoking status: Former Smoker    Packs/day: 1.00    Years: 30.00    Pack years: 30.00    Types: Cigarettes    Quit date: 08/01/2008    Years since quitting: 11.9   Smokeless tobacco: Never Used  Vaping Use   Vaping Use: Never used  Substance and Sexual Activity   Alcohol use: Never   Drug use: No   Sexual  activity: Not on file  Other Topics Concern   Not on file  Social History Narrative   Not on file   Social Determinants of Health   Financial Resource Strain: Low Risk    Difficulty of Paying Living Expenses: Not hard at all  Food Insecurity: No Food Insecurity   Worried About Charity fundraiser in the Last Year: Never true   [place] in the Last Year: Never true  Transportation Needs: No Transportation Needs   Lack of Transportation (Medical): No   Lack of Transportation (Non-Medical): No  Physical Activity: Inactive   Days of Exercise per Week: 0 days   Minutes of Exercise per Session: 0 min  Stress: No Stress Concern Present   Feeling of Stress : Not at all  Social Connections: Socially Isolated   Frequency of Communication with Friends and Family: More than three times a week   Frequency of Social Gatherings with Friends and Family: Three times a week   Attends Religious Services: Never   Active Member of Clubs or Organizations: No   Attends Archivist Meetings: Never   Marital Status: Widowed  Human resources officer Violence: Not At Risk   Fear of Current or Ex-Partner: No   Emotionally Abused: No   Physically Abused: No   Sexually Abused: No    Vital Signs: Blood pressure 99/66, pulse (!) 111, temperature (!) 97.2 F (36.2 C), resp. rate 16, height 5' 1"  (1.549 m), weight 150 lb (68 kg), SpO2 95 %.  Examination: General Appearance: The patient is well-developed, well-nourished, and in no distress. Skin: Gross inspection of skin unremarkable. Head: normocephalic, no gross deformities. Eyes: no gross deformities noted. ENT: ears appear grossly normal no exudates. Neck: Supple. No thyromegaly. No LAD. Respiratory: Clear throughout, no rhonchi. Cardiovascular: Normal S1 and S2 without murmur or rub. Extremities: No cyanosis. pulses are equal. Neurologic: Alert and oriented. No involuntary movements.  LABS: No results found for this  or any previous visit (from the past 2160 hour(s)).  Radiology: MR BRAIN/IAC W WO CONTRAST  Result Date: 05/31/2020 CLINICAL DATA:  Four months of right-sided hearing loss, more recently becoming bilateral. EXAM: MRI HEAD WITHOUT AND WITH CONTRAST TECHNIQUE: Multiplanar, multiecho pulse sequences of the brain and surrounding structures were obtained without and with intravenous contrast. CONTRAST:  58m GADAVIST GADOBUTROL 1 MMOL/ML IV SOLN COMPARISON:  11/03/2017 FINDINGS: Brain: Remote perforator infarct on the left with wallerian degeneration seen into the brainstem. Chronic small vessel ischemia in the cerebral white matter. Nonspecific cerebral volume loss. Negative for retrocochlear lesion or labyrinthine signal abnormality. Clear CP angle cisterns. Vascular: Normal flow voids and vascular enhancements Skull and upper cervical spine: Normal marrow signal Sinuses/Orbits: Bilateral cataract resection. Mild mucosal thickening in the paranasal sinuses that is generalized. IMPRESSION: 1. Negative for retrocochlear lesion or other cause of symptoms. 2. Chronic small vessel ischemia with remote perforator infarct on the left. Electronically Signed   By: JMonte FantasiaM.D.   On: 05/31/2020 10:04   Assessment and Plan: Patient Active Problem List   Diagnosis Date Noted   Crohn's disease of colon without complication ([place] 116/08/9603  Hearing loss 06/01/2019   Vertigo 06/01/2019   Hypokalemia 07/22/2018   Oral thrush 07/22/2018   Gastroenteritis 07/16/2018   Primary Parkinsonism ([place] 01/05/2018   SIADH (syndrome of inappropriate ADH production) ([place] 10/01/2017   Spinal stenosis 10/01/2017   Elevated troponin I level 11/18/2016   COPD (chronic obstructive pulmonary disease) with chronic bronchitis ([place] 08/12/2016   Asthma without status asthmaticus 03/31/2016   Osteoporosis 03/31/2016   Fatty liver determined by biopsy 09/17/2015   Parotid swelling 09/13/2015   Urticaria  09/13/2015   History of CVA (cerebrovascular accident) 08/21/2015   Senile purpura ([place] 08/21/2015   Atrial tachycardia ([place] 08/13/2015   Eczema 07/10/2015   Allergic rhinitis 06/26/2015   Anxiety 04/23/2015   Edema leg 03/27/2015   Generalized weakness 03/06/2015   Primary cardiomyopathy ([place] 03/05/2015   CAD (coronary artery disease) 03/05/2015   Benign essential HTN 02/28/2015   Combined fat and carbohydrate induced hyperlipemia 09/06/2014  Acid reflux 08/01/2014   1. COPD (chronic obstructive pulmonary disease) with chronic bronchitis (HCC) Stable at this time, continue with current therapy. Spirometry stable, last PFT FEV1 1.28L, 66% on 01/2020.  2. Abnormal chest x-ray with multiple lung nodules Follow-up on CXR from 2019, multiple lung nodules found. Will review 2-view CXR, will send for chest CT as appropriate. - DG Chest 2 View; Future  3. Flu vaccine need - Flu Vaccine MDCK QUAD PF  4. Shortness of breath - Spirometry with Graph  General Counseling: I have discussed the findings of the evaluation and examination with Lori Brady.  I have also discussed any further diagnostic evaluation thatmay be needed or ordered today. Lori Brady verbalizes understanding of the findings of todays visit. We also reviewed her medications today and discussed drug interactions and side effects including but not limited excessive drowsiness and altered mental states. We also discussed that there is always a risk not just to her but also people around her. Lori Brady has been encouraged to call the office with any questions or concerns that should arise related to todays visit.  Orders Placed This Encounter  Procedures   DG Chest 2 View    Standing Status:   Future    Standing Expiration Date:   07/23/2021    Order Specific Question:   Reason for Exam (SYMPTOM  OR DIAGNOSIS REQUIRED)    Answer:   dyspnea    Order Specific Question:   Preferred imaging location?    Answer:   [facility]  [place]    Order Specific Question:   Radiology Contrast Protocol - do NOT remove file path    Answer:   [email or web address]   Flu Vaccine MDCK QUAD PF   Spirometry with Graph    Order Specific Question:   Where should this test be performed?    Answer:   [facility]    Order Specific Question:   Basic spirometry    Answer:   Yes    Order Specific Question:   Spirometry pre & post bronchodilator    Answer:   No     Time spent: 25  I have personally obtained a history, examined the patient, evaluated laboratory and imaging results, formulated the assessment and plan and placed orders. This patient was seen by Casey Burkitt AGNP-C in Collaboration with Dr. Devona Konig as a part of collaborative care agreement.    Allyne Gee, MD [facility] Pulmonary and Critical Care Sleep medicine

## 2020-07-24 NOTE — Patient Instructions (Signed)

## 2020-07-25 ENCOUNTER — Ambulatory Visit: Payer: Medicare PPO | Admitting: Family Medicine

## 2020-07-25 ENCOUNTER — Other Ambulatory Visit: Payer: Self-pay

## 2020-07-25 ENCOUNTER — Encounter: Payer: Self-pay | Admitting: Family Medicine

## 2020-07-25 VITALS — BP 102/64 | HR 97 | Temp 98.2°F | Wt 152.0 lb

## 2020-07-25 DIAGNOSIS — M9741XD Periprosthetic fracture around internal prosthetic right elbow joint, subsequent encounter: Secondary | ICD-10-CM | POA: Diagnosis not present

## 2020-07-25 DIAGNOSIS — E782 Mixed hyperlipidemia: Secondary | ICD-10-CM

## 2020-07-25 DIAGNOSIS — I10 Essential (primary) hypertension: Secondary | ICD-10-CM

## 2020-07-25 DIAGNOSIS — E222 Syndrome of inappropriate secretion of antidiuretic hormone: Secondary | ICD-10-CM

## 2020-07-25 DIAGNOSIS — M9741XA Periprosthetic fracture around internal prosthetic right elbow joint, initial encounter: Secondary | ICD-10-CM | POA: Insufficient documentation

## 2020-07-25 DIAGNOSIS — I471 Supraventricular tachycardia: Secondary | ICD-10-CM

## 2020-07-25 DIAGNOSIS — J449 Chronic obstructive pulmonary disease, unspecified: Secondary | ICD-10-CM | POA: Diagnosis not present

## 2020-07-25 DIAGNOSIS — F419 Anxiety disorder, unspecified: Secondary | ICD-10-CM | POA: Diagnosis not present

## 2020-07-25 DIAGNOSIS — Z1211 Encounter for screening for malignant neoplasm of colon: Secondary | ICD-10-CM | POA: Diagnosis not present

## 2020-07-25 NOTE — Progress Notes (Signed)
I,Laura E Walsh,acting as a scribe for [name], MD.,have documented all relevant documentation on the behalf of [name], MD,as directed by  [name], MD while in the presence of [name], MD.    Established patient visit   Patient: Lori Brady   DOB: May 20, 1946   74 y.o. Female  MRN: [id number] Visit Date: 07/25/2020  Today's healthcare provider: [name], MD   Chief Complaint  Patient presents with  . Hypertension   Subjective    HPI   Hypertension, follow-up  BP Readings from Last 3 Encounters:  07/25/20 102/64  07/23/20 99/66  01/22/20 99/67   Wt Readings from Last 3 Encounters:  07/25/20 152 lb (68.9 kg)  07/23/20 150 lb (68 kg)  01/22/20 144 lb (65.3 kg)     She was last seen for hypertension 6 months ago.  Management since that visit includes no changes.  She reports excellent compliance with treatment. She is not having side effects.  She is following a Regular diet. She is not exercising. She does not smoke.  Use of agents associated with hypertension: none.   Outside blood pressures are . Symptoms: No chest pain No chest pressure  No palpitations No syncope  No dyspnea No orthopnea  No paroxysmal nocturnal dyspnea No lower extremity edema   Pertinent labs: Lab Results  Component Value Date   CHOL 145 01/22/2020   HDL 54 01/22/2020   LDLCALC 65 01/22/2020   TRIG 150 (H) 01/22/2020   CHOLHDL 2.7 01/22/2020   Lab Results  Component Value Date   NA 138 07/25/2020   K 4.4 07/25/2020   CREATININE 0.82 07/25/2020   GFRNONAA 72 07/25/2020   GFRAA 83 07/25/2020   GLUCOSE 109 (H) 07/25/2020     The ASCVD Risk score (Goff DC Jr., et al., 2013) failed to calculate for the following reasons:   The patient has a prior MI or stroke diagnosis   ---------------------------------------------------------------------------------------------------   Patient Active Problem List   Diagnosis Date Noted    . Periprosthetic fracture around internal prosthetic right elbow joint 07/25/2020  . Crohn's disease of colon without complication ([place]) 05/11/9484  . Hearing loss 06/01/2019  . Vertigo 06/01/2019  . Hypokalemia 07/22/2018  . Oral thrush 07/22/2018  . Gastroenteritis 07/16/2018  . Primary Parkinsonism ([place]) 01/05/2018  . SIADH (syndrome of inappropriate ADH production) ([place]) 10/01/2017  . Spinal stenosis 10/01/2017  . Elevated troponin I level 11/18/2016  . COPD (chronic obstructive pulmonary disease) with chronic bronchitis ([place]) 08/12/2016  . Asthma without status asthmaticus 03/31/2016  . Osteoporosis 03/31/2016  . Fatty liver determined by biopsy 09/17/2015  . Parotid swelling 09/13/2015  . Urticaria 09/13/2015  . History of CVA (cerebrovascular accident) 08/21/2015  . Senile purpura ([place]) 08/21/2015  . Atrial tachycardia ([place]) 08/13/2015  . Eczema 07/10/2015  . Allergic rhinitis 06/26/2015  . Anxiety 04/23/2015  . Edema leg 03/27/2015  . Generalized weakness 03/06/2015  . Primary cardiomyopathy ([place]) 03/05/2015  . CAD (coronary artery disease) 03/05/2015  . Benign essential HTN 02/28/2015  . Combined fat and carbohydrate induced hyperlipemia 09/06/2014  . Acid reflux 08/01/2014   Past Medical History:  Diagnosis Date  . Arthritis   . Asthma   . Crohn's disease ([place])   . Emphysema of lung ([place])   . GERD (gastroesophageal reflux disease)   . Hip fracture ([place]) 01/26/2017  . History of chicken pox   . History of heart attack   . Hx of completed stroke   .  Hypercholesteremia   . Hypertension   . Parkinson disease ([place])   . Pneumonia   . Seasonal allergies   . Sepsis ([place])   . Septic shock ([place]) 10/08/2016  . UTI (lower urinary tract infection)    Social History   Tobacco Use  . Smoking status: Former Smoker    Packs/day: 1.00    Years: 30.00    Pack years: 30.00    Types: Cigarettes    Quit date: 08/01/2008    Years since quitting: 11.9  . Smokeless  tobacco: Never Used  Vaping Use  . Vaping Use: Never used  Substance Use Topics  . Alcohol use: Never  . Drug use: No   Allergies  Allergen Reactions  . Levofloxacin Nausea Only and Nausea And Vomiting    Can take if given something for n/v  . Vicodin [Hydrocodone-Acetaminophen] Nausea Only     Medications: Outpatient Medications Prior to Visit  Medication Sig  . acetaminophen (TYLENOL) 500 MG tablet Take 500 mg by mouth every 6 (six) hours as needed.   Marland Kitchen albuterol (ACCUNEB) 0.63 MG/3ML nebulizer solution Take 1 ampule by nebulization every 6 (six) hours as needed for wheezing.  Marland Kitchen albuterol (VENTOLIN HFA) 108 (90 Base) MCG/ACT inhaler INHALE 2 PUFFS INTO THE LUNGS EVERY 6 HOURS AS NEEDED FOR WHEEZING OR SHORTNESS OF BREATH  . alendronate (FOSAMAX) 70 MG tablet One tab every Sunday on empty stomach with a full glass of water. Do not lie down or eat/drink anything else for the next 30 min.  Marland Kitchen aspirin 81 MG tablet Take 81 mg by mouth daily.  Marland Kitchen atorvastatin (LIPITOR) 40 MG tablet TAKE 1 TABLET BY MOUTH AT BEDTIME  . betamethasone valerate (VALISONE) 0.1 % cream APPLY TOPICALLY TO AFFECTED AREA SPARINGLY 2 TIMES DAILY.  Marland Kitchen BREO ELLIPTA 100-25 MCG/INH AEPB INHALE 1 PUFF BY MOUTH ONCE DAILY AS DIRECTED  . carbidopa-levodopa (SINEMET CR) 50-200 MG tablet Take 1 tablet by mouth daily. Before dose  . cetirizine (ZYRTEC) 10 MG tablet TAKE 1 TABLET BY MOUTH ONCE DAILY  . Cholecalciferol (VITAMIN D) 2000 UNITS CAPS Take 1 capsule by mouth daily.  . citalopram (CELEXA) 20 MG tablet TAKE 1 TABLET BY MOUTH ONCE DAILY  . clobetasol (TEMOVATE) 0.05 % external solution Apply 1 application topically 2 (two) times daily. As needed  . clobetasol cream (TEMOVATE) 3.08 % Apply 1 application topically 2 (two) times daily. As needed  . doxepin (SINEQUAN) 25 MG capsule TAKE 1 CAPSULE BY MOUTH AT BEDTIME  . fluticasone (FLONASE) 50 MCG/ACT nasal spray PLACE TO SPRAYS INTO BOTHS NOSTRILS DAILY  . ibuprofen  (ADVIL) 600 MG tablet ibuprofen 600 mg tablet  Take 1 tablet 3 times a day by oral route.  Marland Kitchen ketoconazole (NIZORAL) 2 % shampoo Apply 1 application topically 2 (two) times a week.  Marland Kitchen ketorolac (TORADOL) 10 MG tablet Take 10 mg by mouth every 4 (four) hours as needed.  . montelukast (SINGULAIR) 10 MG tablet TAKE 1 TABLET BY MOUTH ONCE DAILY  . nystatin (MYCOSTATIN) 100000 UNIT/ML suspension Take 5 mLs (500,000 Units total) by mouth 4 (four) times daily. Until resolution  . omeprazole (PRILOSEC) 40 MG capsule Take 40 mg by mouth daily.   . ondansetron (ZOFRAN ODT) 4 MG disintegrating tablet Take 1 tablet (4 mg total) by mouth every 8 (eight) hours as needed.  . potassium chloride SA (K-DUR) 20 MEQ tablet TAKE 1 TABLET BY MOUTH ONCE DAILY  . Tiotropium Bromide Monohydrate (SPIRIVA RESPIMAT) 2.5 MCG/ACT AERS USE 2  PUFFS BY MOUTH ONCE DAILY FOR COPD  . carbidopa-levodopa (SINEMET IR) 25-100 MG tablet Take 2 tablets by mouth 3 (three) times daily.   . [DISCONTINUED] ondansetron (ZOFRAN) 4 MG tablet Take 1 tablet (4 mg total) by mouth every 8 (eight) hours as needed for nausea or vomiting.   No facility-administered medications prior to visit.    Review of Systems  Constitutional: Negative.   Respiratory: Negative.   Cardiovascular: Negative.   Gastrointestinal: Negative.   Neurological: Positive for headaches (occasionally). Negative for dizziness and light-headedness.      Objective    BP 102/64 (BP Location: Left Arm, Patient Position: Sitting, Cuff Size: Large)   Pulse 97   Temp 98.2 F (36.8 C) (Oral)   Wt 152 lb (68.9 kg)   SpO2 96%   BMI 28.72 kg/m    Physical Exam Constitutional:      General: She is not in acute distress.    Appearance: Normal appearance.  Cardiovascular:     Rate and Rhythm: Normal rate and regular rhythm.     Pulses: Normal pulses.  Pulmonary:     Effort: Pulmonary effort is normal. No respiratory distress.     Breath sounds: Normal breath sounds. No  wheezing.  Abdominal:     Palpations: Abdomen is soft.     Tenderness: There is no abdominal tenderness.  Musculoskeletal:     Cervical back: Normal range of motion and neck supple.     Right lower leg: No edema.     Left lower leg: No edema.  Lymphadenopathy:     Cervical: No cervical adenopathy.  Skin:    General: Skin is warm and dry.  Neurological:     Mental Status: She is alert and oriented to person, place, and time. Mental status is at baseline.  Psychiatric:        Mood and Affect: Mood is depressed.        Behavior: Behavior normal.     Results for orders placed or performed in visit on 07/25/20  CBC w/Diff/Platelet  Result Value Ref Range   WBC 8.2 3.4 - 10.8 x10E3/uL   RBC 3.75 (L) 3.77 - 5.28 x10E6/uL   Hemoglobin 10.5 (L) 11.1 - 15.9 g/dL   Hematocrit 33.1 (L) 34.0 - 46.6 %   MCV 88 79 - 97 fL   MCH 28.0 26.6 - 33.0 pg   MCHC 31.7 31 - 35 g/dL   RDW 14.5 11.7 - 15.4 %   Platelets 194 150 - 450 x10E3/uL   Neutrophils 65 Not Estab. %   Lymphs 20 Not Estab. %   Monocytes 10 Not Estab. %   Eos 3 Not Estab. %   Basos 1 Not Estab. %   Neutrophils Absolute 5.4 1 - 7 x10E3/uL   Lymphocytes Absolute 1.6 0 - 3 x10E3/uL   Monocytes Absolute 0.8 0 - 0 x10E3/uL   EOS (ABSOLUTE) 0.2 0.0 - 0.4 x10E3/uL   Basophils Absolute 0.0 0 - 0 x10E3/uL   Immature Granulocytes 1 Not Estab. %   Immature Grans (Abs) 0.1 0.0 - 0.1 x10E3/uL  Sed Rate (ESR)  Result Value Ref Range   Sed Rate 16 0 - 40 mm/hr  C-reactive protein  Result Value Ref Range   CRP 6 0 - 10 mg/L  Basic Metabolic Panel (BMET)  Result Value Ref Range   Glucose 109 (H) 65 - 99 mg/dL   BUN 13 8 - 27 mg/dL   Creatinine, Ser 0.82 0.57 - 1.00 mg/dL  GFR calc non Af Amer 72 >59 mL/min/1.73   GFR calc Af Amer 83 >59 mL/min/1.73   BUN/Creatinine Ratio 16 12 - 28   Sodium 138 134 - 144 mmol/L   Potassium 4.4 3.5 - 5.2 mmol/L   Chloride 99 96 - 106 mmol/L   CO2 25 20 - 29 mmol/L   Calcium 9.5 8.7 - 10.3 mg/dL     Assessment & Plan     Problem List Items Addressed This Visit      Cardiovascular and Mediastinum   Benign essential HTN - Primary    Well controlled Off all blood pressure medications. Recheck metabolic panel F/u in 6 months       Relevant Orders   CBC w/Diff/Platelet (Completed)   Basic Metabolic Panel (BMET) (Completed)   Atrial tachycardia ([place])    Rate controlled today Followed by Cardiology        Respiratory   COPD (chronic obstructive pulmonary disease) with chronic bronchitis (HCC)    Chronic and well-controlled. Followed by pulmonology.  Continue current medications.          Endocrine   SIADH (syndrome of inappropriate ADH production) ([place])    Followed by Endocrinology Monitor Na level        Musculoskeletal and Integument   Periprosthetic fracture around internal prosthetic right elbow joint    Followed by Ortho hand (Dr Peggye Ley) Has upcoming CT scan to further evaluate May need further surgery Needs labs drawn today for Dr. Peggye Ley, including BMP, CBC, ESR, CRP      Relevant Orders   CBC w/Diff/Platelet (Completed)   Sed Rate (ESR) (Completed)   C-reactive protein (Completed)   Basic Metabolic Panel (BMET) (Completed)     Other   Anxiety    Previously well controlled Some exacerbation, along with some depressed mood related to current health status Continue Celexa at current dose Discussed importance of therapy Discussed importance of support system She will follow-up with me sooner if we need to consider additional treatment in the setting of her poor health conditions currently      Combined fat and carbohydrate induced hyperlipemia    Previously well controlled Reviewed last Lipids Continue statin Repeat BMP Goal LDL < 100        Other Visit Diagnoses    Screening for colon cancer       Relevant Orders   Cologuard       Return in about 6 months (around 01/22/2021) for CPE, AWV.      I, [name], MD, have  reviewed all documentation for this visit. The documentation on 07/26/20 for the exam, diagnosis, procedures, and orders are all accurate and complete.   Dallys Nowakowski, Dionne Bucy, MD, MPH [facility]

## 2020-07-25 NOTE — Assessment & Plan Note (Addendum)
Well controlled Off all blood pressure medications. Recheck metabolic panel F/u in 6 months

## 2020-07-25 NOTE — Assessment & Plan Note (Signed)
Previously well controlled Reviewed last Lipids Continue statin Repeat BMP Goal LDL < 100

## 2020-07-25 NOTE — Assessment & Plan Note (Signed)
Chronic and well-controlled. Followed by pulmonology.  Continue current medications.

## 2020-07-26 LAB — BASIC METABOLIC PANEL
BUN/Creatinine Ratio: 16 (ref 12–28)
BUN: 13 mg/dL (ref 8–27)
CO2: 25 mmol/L (ref 20–29)
Calcium: 9.5 mg/dL (ref 8.7–10.3)
Chloride: 99 mmol/L (ref 96–106)
Creatinine, Ser: 0.82 mg/dL (ref 0.57–1.00)
GFR calc Af Amer: 83 mL/min/{1.73_m2} (ref >59)
GFR calc non Af Amer: 72 mL/min/{1.73_m2} (ref >59)
Glucose: 109 mg/dL — ABNORMAL HIGH (ref 65–99)
Potassium: 4.4 mmol/L (ref 3.5–5.2)
Sodium: 138 mmol/L (ref 134–144)

## 2020-07-26 LAB — CBC WITH DIFFERENTIAL/PLATELET
Basophils Absolute: 0 10*3/uL (ref 0.0–0.2)
Basos: 1 %
EOS (ABSOLUTE): 0.2 10*3/uL (ref 0.0–0.4)
Eos: 3 %
Hematocrit: 33.1 % — ABNORMAL LOW (ref 34.0–46.6)
Hemoglobin: 10.5 g/dL — ABNORMAL LOW (ref 11.1–15.9)
Immature Grans (Abs): 0.1 10*3/uL (ref 0.0–0.1)
Immature Granulocytes: 1 %
Lymphocytes Absolute: 1.6 10*3/uL (ref 0.7–3.1)
Lymphs: 20 %
MCH: 28 pg (ref 26.6–33.0)
MCHC: 31.7 g/dL (ref 31.5–35.7)
MCV: 88 fL (ref 79–97)
Monocytes Absolute: 0.8 10*3/uL (ref 0.1–0.9)
Monocytes: 10 %
Neutrophils Absolute: 5.4 10*3/uL (ref 1.4–7.0)
Neutrophils: 65 %
Platelets: 194 10*3/uL (ref 150–450)
RBC: 3.75 x10E6/uL — ABNORMAL LOW (ref 3.77–5.28)
RDW: 14.5 % (ref 11.7–15.4)
WBC: 8.2 10*3/uL (ref 3.4–10.8)

## 2020-07-26 LAB — C-REACTIVE PROTEIN: CRP: 6 mg/L (ref 0–10)

## 2020-07-26 LAB — SEDIMENTATION RATE: Sed Rate: 16 mm/hr (ref 0–40)

## 2020-07-26 NOTE — Assessment & Plan Note (Signed)
Followed by Endocrinology Monitor Na level

## 2020-07-26 NOTE — Assessment & Plan Note (Signed)
Followed by Ortho hand (Dr Peggye Ley) Has upcoming CT scan to further evaluate May need further surgery Needs labs drawn today for Dr. Peggye Ley, including BMP, CBC, ESR, CRP

## 2020-07-26 NOTE — Assessment & Plan Note (Signed)
Previously well controlled Some exacerbation, along with some depressed mood related to current health status Continue Celexa at current dose Discussed importance of therapy Discussed importance of support system She will follow-up with me sooner if we need to consider additional treatment in the setting of her poor health conditions currently

## 2020-07-26 NOTE — Assessment & Plan Note (Signed)
Rate controlled today Followed by Cardiology

## 2020-07-30 ENCOUNTER — Telehealth: Payer: Self-pay

## 2020-07-30 NOTE — Telephone Encounter (Signed)
Caretaker [name] advised as below. Lab results faxed. sd

## 2020-07-30 NOTE — Telephone Encounter (Signed)
-----   Message from Virginia Crews, MD sent at 07/29/2020 10:54 AM EDT ----- Stable labs.  Need to fax to Dr Peggye Ley at [facility] for possible upcoming surgery. Thanks!

## 2020-08-05 DIAGNOSIS — M9741XA Periprosthetic fracture around internal prosthetic right elbow joint, initial encounter: Secondary | ICD-10-CM | POA: Diagnosis not present

## 2020-08-08 DIAGNOSIS — M9741XA Periprosthetic fracture around internal prosthetic right elbow joint, initial encounter: Secondary | ICD-10-CM | POA: Diagnosis not present

## 2020-08-08 DIAGNOSIS — M25521 Pain in right elbow: Secondary | ICD-10-CM | POA: Diagnosis not present

## 2020-08-08 DIAGNOSIS — M9741XD Periprosthetic fracture around internal prosthetic right elbow joint, subsequent encounter: Secondary | ICD-10-CM | POA: Diagnosis not present

## 2020-08-13 ENCOUNTER — Other Ambulatory Visit: Payer: Self-pay | Admitting: Family Medicine

## 2020-08-13 ENCOUNTER — Other Ambulatory Visit: Payer: Self-pay | Admitting: Adult Health

## 2020-08-13 DIAGNOSIS — J449 Chronic obstructive pulmonary disease, unspecified: Secondary | ICD-10-CM

## 2020-08-13 NOTE — Telephone Encounter (Signed)
Requested Prescriptions  Pending Prescriptions Disp Refills   doxepin (SINEQUAN) 25 MG capsule [Pharmacy Med Name: DOXEPIN HCL 25 MG CAP] 90 capsule 0    Sig: TAKE 1 CAPSULE BY MOUTH AT BEDTIME     Psychiatry:  Antidepressants - Heterocyclics (TCAs) Passed - 08/13/2020  4:26 PM      Passed - Valid encounter within last 6 months    Recent Outpatient Visits          2 weeks ago Benign essential HTN   [facility] [place], Marzella Schlein, MD   6 months ago Encounter for annual physical exam   [facility] [place], Marzella Schlein, MD   11 months ago Benign essential HTN   [facility] [place], Marzella Schlein, MD   1 year ago Benign essential HTN   [facility] [place], Marzella Schlein, MD   1 year ago Gastroenteritis   [facility], Lavella [place], [place]      Future Appointments            In 5 months Bacigalupo, Marzella Schlein, MD [facility]

## 2020-08-15 DIAGNOSIS — Z961 Presence of intraocular lens: Secondary | ICD-10-CM | POA: Diagnosis not present

## 2020-08-15 DIAGNOSIS — H524 Presbyopia: Secondary | ICD-10-CM | POA: Diagnosis not present

## 2020-08-16 ENCOUNTER — Other Ambulatory Visit: Payer: Self-pay | Admitting: Physician Assistant

## 2020-08-28 DIAGNOSIS — I10 Essential (primary) hypertension: Secondary | ICD-10-CM | POA: Diagnosis not present

## 2020-08-28 DIAGNOSIS — J45909 Unspecified asthma, uncomplicated: Secondary | ICD-10-CM | POA: Diagnosis not present

## 2020-08-28 DIAGNOSIS — K219 Gastro-esophageal reflux disease without esophagitis: Secondary | ICD-10-CM | POA: Diagnosis not present

## 2020-08-28 DIAGNOSIS — I959 Hypotension, unspecified: Secondary | ICD-10-CM | POA: Diagnosis not present

## 2020-08-28 DIAGNOSIS — M810 Age-related osteoporosis without current pathological fracture: Secondary | ICD-10-CM | POA: Diagnosis not present

## 2020-08-28 DIAGNOSIS — M9741XA Periprosthetic fracture around internal prosthetic right elbow joint, initial encounter: Secondary | ICD-10-CM | POA: Diagnosis not present

## 2020-08-28 DIAGNOSIS — S52181K Other fracture of upper end of right radius, subsequent encounter for closed fracture with nonunion: Secondary | ICD-10-CM | POA: Diagnosis not present

## 2020-08-28 DIAGNOSIS — F419 Anxiety disorder, unspecified: Secondary | ICD-10-CM | POA: Diagnosis not present

## 2020-08-28 DIAGNOSIS — G8918 Other acute postprocedural pain: Secondary | ICD-10-CM | POA: Diagnosis not present

## 2020-08-28 DIAGNOSIS — S52271K Monteggia's fracture of right ulna, subsequent encounter for closed fracture with nonunion: Secondary | ICD-10-CM | POA: Diagnosis not present

## 2020-08-28 DIAGNOSIS — E7800 Pure hypercholesterolemia, unspecified: Secondary | ICD-10-CM | POA: Diagnosis not present

## 2020-08-28 DIAGNOSIS — H579 Unspecified disorder of eye and adnexa: Secondary | ICD-10-CM | POA: Diagnosis not present

## 2020-08-28 DIAGNOSIS — J449 Chronic obstructive pulmonary disease, unspecified: Secondary | ICD-10-CM | POA: Diagnosis not present

## 2020-08-28 DIAGNOSIS — G20 Parkinson's disease: Secondary | ICD-10-CM | POA: Diagnosis not present

## 2020-08-28 DIAGNOSIS — E785 Hyperlipidemia, unspecified: Secondary | ICD-10-CM | POA: Diagnosis not present

## 2020-08-28 DIAGNOSIS — M25521 Pain in right elbow: Secondary | ICD-10-CM | POA: Diagnosis not present

## 2020-08-28 DIAGNOSIS — T84038A Mechanical loosening of other internal prosthetic joint, initial encounter: Secondary | ICD-10-CM | POA: Diagnosis not present

## 2020-08-29 DIAGNOSIS — T84038A Mechanical loosening of other internal prosthetic joint, initial encounter: Secondary | ICD-10-CM | POA: Diagnosis not present

## 2020-08-29 DIAGNOSIS — F419 Anxiety disorder, unspecified: Secondary | ICD-10-CM | POA: Diagnosis not present

## 2020-08-29 DIAGNOSIS — I959 Hypotension, unspecified: Secondary | ICD-10-CM | POA: Diagnosis not present

## 2020-08-29 DIAGNOSIS — M810 Age-related osteoporosis without current pathological fracture: Secondary | ICD-10-CM | POA: Diagnosis not present

## 2020-08-29 DIAGNOSIS — E785 Hyperlipidemia, unspecified: Secondary | ICD-10-CM | POA: Diagnosis not present

## 2020-08-29 DIAGNOSIS — J449 Chronic obstructive pulmonary disease, unspecified: Secondary | ICD-10-CM | POA: Diagnosis not present

## 2020-08-29 DIAGNOSIS — K219 Gastro-esophageal reflux disease without esophagitis: Secondary | ICD-10-CM | POA: Diagnosis not present

## 2020-08-29 DIAGNOSIS — G20 Parkinson's disease: Secondary | ICD-10-CM | POA: Diagnosis not present

## 2020-09-03 DIAGNOSIS — M25521 Pain in right elbow: Secondary | ICD-10-CM | POA: Diagnosis not present

## 2020-09-03 DIAGNOSIS — M9741XD Periprosthetic fracture around internal prosthetic right elbow joint, subsequent encounter: Secondary | ICD-10-CM | POA: Diagnosis not present

## 2020-09-04 ENCOUNTER — Ambulatory Visit: Payer: Self-pay | Admitting: Family Medicine

## 2020-09-05 ENCOUNTER — Other Ambulatory Visit: Payer: Self-pay | Admitting: Family Medicine

## 2020-09-05 ENCOUNTER — Other Ambulatory Visit: Payer: Self-pay | Admitting: Adult Health

## 2020-09-05 DIAGNOSIS — J301 Allergic rhinitis due to pollen: Secondary | ICD-10-CM

## 2020-09-06 DIAGNOSIS — F329 Major depressive disorder, single episode, unspecified: Secondary | ICD-10-CM | POA: Diagnosis not present

## 2020-09-06 DIAGNOSIS — M1990 Unspecified osteoarthritis, unspecified site: Secondary | ICD-10-CM | POA: Diagnosis not present

## 2020-09-06 DIAGNOSIS — I10 Essential (primary) hypertension: Secondary | ICD-10-CM | POA: Diagnosis not present

## 2020-09-06 DIAGNOSIS — I2510 Atherosclerotic heart disease of native coronary artery without angina pectoris: Secondary | ICD-10-CM | POA: Diagnosis not present

## 2020-09-06 DIAGNOSIS — M9741XD Periprosthetic fracture around internal prosthetic right elbow joint, subsequent encounter: Secondary | ICD-10-CM | POA: Diagnosis not present

## 2020-09-06 DIAGNOSIS — J439 Emphysema, unspecified: Secondary | ICD-10-CM | POA: Diagnosis not present

## 2020-09-06 DIAGNOSIS — M80021D Age-related osteoporosis with current pathological fracture, right humerus, subsequent encounter for fracture with routine healing: Secondary | ICD-10-CM | POA: Diagnosis not present

## 2020-09-06 DIAGNOSIS — J45909 Unspecified asthma, uncomplicated: Secondary | ICD-10-CM | POA: Diagnosis not present

## 2020-09-06 DIAGNOSIS — G20 Parkinson's disease: Secondary | ICD-10-CM | POA: Diagnosis not present

## 2020-09-10 DIAGNOSIS — G20 Parkinson's disease: Secondary | ICD-10-CM | POA: Diagnosis not present

## 2020-09-10 DIAGNOSIS — F329 Major depressive disorder, single episode, unspecified: Secondary | ICD-10-CM | POA: Diagnosis not present

## 2020-09-10 DIAGNOSIS — J45909 Unspecified asthma, uncomplicated: Secondary | ICD-10-CM | POA: Diagnosis not present

## 2020-09-10 DIAGNOSIS — M9741XD Periprosthetic fracture around internal prosthetic right elbow joint, subsequent encounter: Secondary | ICD-10-CM | POA: Diagnosis not present

## 2020-09-10 DIAGNOSIS — I2510 Atherosclerotic heart disease of native coronary artery without angina pectoris: Secondary | ICD-10-CM | POA: Diagnosis not present

## 2020-09-10 DIAGNOSIS — M80021D Age-related osteoporosis with current pathological fracture, right humerus, subsequent encounter for fracture with routine healing: Secondary | ICD-10-CM | POA: Diagnosis not present

## 2020-09-10 DIAGNOSIS — J439 Emphysema, unspecified: Secondary | ICD-10-CM | POA: Diagnosis not present

## 2020-09-10 DIAGNOSIS — I10 Essential (primary) hypertension: Secondary | ICD-10-CM | POA: Diagnosis not present

## 2020-09-10 DIAGNOSIS — Z09 Encounter for follow-up examination after completed treatment for conditions other than malignant neoplasm: Secondary | ICD-10-CM | POA: Diagnosis not present

## 2020-09-10 DIAGNOSIS — M1990 Unspecified osteoarthritis, unspecified site: Secondary | ICD-10-CM | POA: Diagnosis not present

## 2020-09-11 DIAGNOSIS — J439 Emphysema, unspecified: Secondary | ICD-10-CM | POA: Diagnosis not present

## 2020-09-11 DIAGNOSIS — G20 Parkinson's disease: Secondary | ICD-10-CM | POA: Diagnosis not present

## 2020-09-11 DIAGNOSIS — I10 Essential (primary) hypertension: Secondary | ICD-10-CM | POA: Diagnosis not present

## 2020-09-11 DIAGNOSIS — M1990 Unspecified osteoarthritis, unspecified site: Secondary | ICD-10-CM | POA: Diagnosis not present

## 2020-09-11 DIAGNOSIS — J45909 Unspecified asthma, uncomplicated: Secondary | ICD-10-CM | POA: Diagnosis not present

## 2020-09-11 DIAGNOSIS — M80021D Age-related osteoporosis with current pathological fracture, right humerus, subsequent encounter for fracture with routine healing: Secondary | ICD-10-CM | POA: Diagnosis not present

## 2020-09-11 DIAGNOSIS — I2510 Atherosclerotic heart disease of native coronary artery without angina pectoris: Secondary | ICD-10-CM | POA: Diagnosis not present

## 2020-09-11 DIAGNOSIS — M9741XD Periprosthetic fracture around internal prosthetic right elbow joint, subsequent encounter: Secondary | ICD-10-CM | POA: Diagnosis not present

## 2020-09-11 DIAGNOSIS — F329 Major depressive disorder, single episode, unspecified: Secondary | ICD-10-CM | POA: Diagnosis not present

## 2020-09-12 DIAGNOSIS — I10 Essential (primary) hypertension: Secondary | ICD-10-CM | POA: Diagnosis not present

## 2020-09-12 DIAGNOSIS — J45909 Unspecified asthma, uncomplicated: Secondary | ICD-10-CM | POA: Diagnosis not present

## 2020-09-12 DIAGNOSIS — M9741XD Periprosthetic fracture around internal prosthetic right elbow joint, subsequent encounter: Secondary | ICD-10-CM | POA: Diagnosis not present

## 2020-09-12 DIAGNOSIS — M1990 Unspecified osteoarthritis, unspecified site: Secondary | ICD-10-CM | POA: Diagnosis not present

## 2020-09-12 DIAGNOSIS — J439 Emphysema, unspecified: Secondary | ICD-10-CM | POA: Diagnosis not present

## 2020-09-12 DIAGNOSIS — M80021D Age-related osteoporosis with current pathological fracture, right humerus, subsequent encounter for fracture with routine healing: Secondary | ICD-10-CM | POA: Diagnosis not present

## 2020-09-12 DIAGNOSIS — G20 Parkinson's disease: Secondary | ICD-10-CM | POA: Diagnosis not present

## 2020-09-12 DIAGNOSIS — F329 Major depressive disorder, single episode, unspecified: Secondary | ICD-10-CM | POA: Diagnosis not present

## 2020-09-12 DIAGNOSIS — I2510 Atherosclerotic heart disease of native coronary artery without angina pectoris: Secondary | ICD-10-CM | POA: Diagnosis not present

## 2020-09-17 DIAGNOSIS — I2510 Atherosclerotic heart disease of native coronary artery without angina pectoris: Secondary | ICD-10-CM | POA: Diagnosis not present

## 2020-09-17 DIAGNOSIS — G20 Parkinson's disease: Secondary | ICD-10-CM | POA: Diagnosis not present

## 2020-09-17 DIAGNOSIS — M80021D Age-related osteoporosis with current pathological fracture, right humerus, subsequent encounter for fracture with routine healing: Secondary | ICD-10-CM | POA: Diagnosis not present

## 2020-09-17 DIAGNOSIS — J439 Emphysema, unspecified: Secondary | ICD-10-CM | POA: Diagnosis not present

## 2020-09-17 DIAGNOSIS — M9741XD Periprosthetic fracture around internal prosthetic right elbow joint, subsequent encounter: Secondary | ICD-10-CM | POA: Diagnosis not present

## 2020-09-17 DIAGNOSIS — M1990 Unspecified osteoarthritis, unspecified site: Secondary | ICD-10-CM | POA: Diagnosis not present

## 2020-09-17 DIAGNOSIS — J45909 Unspecified asthma, uncomplicated: Secondary | ICD-10-CM | POA: Diagnosis not present

## 2020-09-17 DIAGNOSIS — F329 Major depressive disorder, single episode, unspecified: Secondary | ICD-10-CM | POA: Diagnosis not present

## 2020-09-17 DIAGNOSIS — I10 Essential (primary) hypertension: Secondary | ICD-10-CM | POA: Diagnosis not present

## 2020-09-18 DIAGNOSIS — F329 Major depressive disorder, single episode, unspecified: Secondary | ICD-10-CM | POA: Diagnosis not present

## 2020-09-18 DIAGNOSIS — I2510 Atherosclerotic heart disease of native coronary artery without angina pectoris: Secondary | ICD-10-CM | POA: Diagnosis not present

## 2020-09-18 DIAGNOSIS — J45909 Unspecified asthma, uncomplicated: Secondary | ICD-10-CM | POA: Diagnosis not present

## 2020-09-18 DIAGNOSIS — G20 Parkinson's disease: Secondary | ICD-10-CM | POA: Diagnosis not present

## 2020-09-18 DIAGNOSIS — J439 Emphysema, unspecified: Secondary | ICD-10-CM | POA: Diagnosis not present

## 2020-09-18 DIAGNOSIS — I10 Essential (primary) hypertension: Secondary | ICD-10-CM | POA: Diagnosis not present

## 2020-09-18 DIAGNOSIS — M80021D Age-related osteoporosis with current pathological fracture, right humerus, subsequent encounter for fracture with routine healing: Secondary | ICD-10-CM | POA: Diagnosis not present

## 2020-09-18 DIAGNOSIS — M9741XD Periprosthetic fracture around internal prosthetic right elbow joint, subsequent encounter: Secondary | ICD-10-CM | POA: Diagnosis not present

## 2020-09-18 DIAGNOSIS — M1990 Unspecified osteoarthritis, unspecified site: Secondary | ICD-10-CM | POA: Diagnosis not present

## 2020-09-20 DIAGNOSIS — I10 Essential (primary) hypertension: Secondary | ICD-10-CM | POA: Diagnosis not present

## 2020-09-20 DIAGNOSIS — J45909 Unspecified asthma, uncomplicated: Secondary | ICD-10-CM | POA: Diagnosis not present

## 2020-09-20 DIAGNOSIS — M1990 Unspecified osteoarthritis, unspecified site: Secondary | ICD-10-CM | POA: Diagnosis not present

## 2020-09-20 DIAGNOSIS — I2510 Atherosclerotic heart disease of native coronary artery without angina pectoris: Secondary | ICD-10-CM | POA: Diagnosis not present

## 2020-09-20 DIAGNOSIS — J439 Emphysema, unspecified: Secondary | ICD-10-CM | POA: Diagnosis not present

## 2020-09-20 DIAGNOSIS — M80021D Age-related osteoporosis with current pathological fracture, right humerus, subsequent encounter for fracture with routine healing: Secondary | ICD-10-CM | POA: Diagnosis not present

## 2020-09-20 DIAGNOSIS — G20 Parkinson's disease: Secondary | ICD-10-CM | POA: Diagnosis not present

## 2020-09-20 DIAGNOSIS — F329 Major depressive disorder, single episode, unspecified: Secondary | ICD-10-CM | POA: Diagnosis not present

## 2020-09-20 DIAGNOSIS — M9741XD Periprosthetic fracture around internal prosthetic right elbow joint, subsequent encounter: Secondary | ICD-10-CM | POA: Diagnosis not present

## 2020-09-23 DIAGNOSIS — I2510 Atherosclerotic heart disease of native coronary artery without angina pectoris: Secondary | ICD-10-CM | POA: Diagnosis not present

## 2020-09-23 DIAGNOSIS — J439 Emphysema, unspecified: Secondary | ICD-10-CM | POA: Diagnosis not present

## 2020-09-23 DIAGNOSIS — F329 Major depressive disorder, single episode, unspecified: Secondary | ICD-10-CM | POA: Diagnosis not present

## 2020-09-23 DIAGNOSIS — M80021D Age-related osteoporosis with current pathological fracture, right humerus, subsequent encounter for fracture with routine healing: Secondary | ICD-10-CM | POA: Diagnosis not present

## 2020-09-23 DIAGNOSIS — M1990 Unspecified osteoarthritis, unspecified site: Secondary | ICD-10-CM | POA: Diagnosis not present

## 2020-09-23 DIAGNOSIS — G20 Parkinson's disease: Secondary | ICD-10-CM | POA: Diagnosis not present

## 2020-09-23 DIAGNOSIS — I10 Essential (primary) hypertension: Secondary | ICD-10-CM | POA: Diagnosis not present

## 2020-09-23 DIAGNOSIS — M9741XD Periprosthetic fracture around internal prosthetic right elbow joint, subsequent encounter: Secondary | ICD-10-CM | POA: Diagnosis not present

## 2020-09-23 DIAGNOSIS — J45909 Unspecified asthma, uncomplicated: Secondary | ICD-10-CM | POA: Diagnosis not present

## 2020-09-25 DIAGNOSIS — J439 Emphysema, unspecified: Secondary | ICD-10-CM | POA: Diagnosis not present

## 2020-09-25 DIAGNOSIS — I10 Essential (primary) hypertension: Secondary | ICD-10-CM | POA: Diagnosis not present

## 2020-09-25 DIAGNOSIS — M9741XD Periprosthetic fracture around internal prosthetic right elbow joint, subsequent encounter: Secondary | ICD-10-CM | POA: Diagnosis not present

## 2020-09-25 DIAGNOSIS — M1990 Unspecified osteoarthritis, unspecified site: Secondary | ICD-10-CM | POA: Diagnosis not present

## 2020-09-25 DIAGNOSIS — G20 Parkinson's disease: Secondary | ICD-10-CM | POA: Diagnosis not present

## 2020-09-25 DIAGNOSIS — M80021D Age-related osteoporosis with current pathological fracture, right humerus, subsequent encounter for fracture with routine healing: Secondary | ICD-10-CM | POA: Diagnosis not present

## 2020-09-25 DIAGNOSIS — J45909 Unspecified asthma, uncomplicated: Secondary | ICD-10-CM | POA: Diagnosis not present

## 2020-09-25 DIAGNOSIS — F329 Major depressive disorder, single episode, unspecified: Secondary | ICD-10-CM | POA: Diagnosis not present

## 2020-09-25 DIAGNOSIS — I2510 Atherosclerotic heart disease of native coronary artery without angina pectoris: Secondary | ICD-10-CM | POA: Diagnosis not present

## 2020-09-26 DIAGNOSIS — F329 Major depressive disorder, single episode, unspecified: Secondary | ICD-10-CM | POA: Diagnosis not present

## 2020-09-26 DIAGNOSIS — M9741XD Periprosthetic fracture around internal prosthetic right elbow joint, subsequent encounter: Secondary | ICD-10-CM | POA: Diagnosis not present

## 2020-09-26 DIAGNOSIS — M1990 Unspecified osteoarthritis, unspecified site: Secondary | ICD-10-CM | POA: Diagnosis not present

## 2020-09-26 DIAGNOSIS — J45909 Unspecified asthma, uncomplicated: Secondary | ICD-10-CM | POA: Diagnosis not present

## 2020-09-26 DIAGNOSIS — I2510 Atherosclerotic heart disease of native coronary artery without angina pectoris: Secondary | ICD-10-CM | POA: Diagnosis not present

## 2020-09-26 DIAGNOSIS — I10 Essential (primary) hypertension: Secondary | ICD-10-CM | POA: Diagnosis not present

## 2020-09-26 DIAGNOSIS — M80021D Age-related osteoporosis with current pathological fracture, right humerus, subsequent encounter for fracture with routine healing: Secondary | ICD-10-CM | POA: Diagnosis not present

## 2020-09-26 DIAGNOSIS — J439 Emphysema, unspecified: Secondary | ICD-10-CM | POA: Diagnosis not present

## 2020-09-26 DIAGNOSIS — G20 Parkinson's disease: Secondary | ICD-10-CM | POA: Diagnosis not present

## 2020-09-27 DIAGNOSIS — F329 Major depressive disorder, single episode, unspecified: Secondary | ICD-10-CM | POA: Diagnosis not present

## 2020-09-27 DIAGNOSIS — G20 Parkinson's disease: Secondary | ICD-10-CM | POA: Diagnosis not present

## 2020-09-27 DIAGNOSIS — I10 Essential (primary) hypertension: Secondary | ICD-10-CM | POA: Diagnosis not present

## 2020-09-27 DIAGNOSIS — J45909 Unspecified asthma, uncomplicated: Secondary | ICD-10-CM | POA: Diagnosis not present

## 2020-09-27 DIAGNOSIS — M80021D Age-related osteoporosis with current pathological fracture, right humerus, subsequent encounter for fracture with routine healing: Secondary | ICD-10-CM | POA: Diagnosis not present

## 2020-09-27 DIAGNOSIS — I2510 Atherosclerotic heart disease of native coronary artery without angina pectoris: Secondary | ICD-10-CM | POA: Diagnosis not present

## 2020-09-27 DIAGNOSIS — M1990 Unspecified osteoarthritis, unspecified site: Secondary | ICD-10-CM | POA: Diagnosis not present

## 2020-09-27 DIAGNOSIS — J439 Emphysema, unspecified: Secondary | ICD-10-CM | POA: Diagnosis not present

## 2020-09-27 DIAGNOSIS — M9741XD Periprosthetic fracture around internal prosthetic right elbow joint, subsequent encounter: Secondary | ICD-10-CM | POA: Diagnosis not present

## 2020-09-28 ENCOUNTER — Other Ambulatory Visit: Payer: Self-pay | Admitting: Internal Medicine

## 2020-09-30 DIAGNOSIS — M9741XD Periprosthetic fracture around internal prosthetic right elbow joint, subsequent encounter: Secondary | ICD-10-CM | POA: Diagnosis not present

## 2020-09-30 DIAGNOSIS — M1990 Unspecified osteoarthritis, unspecified site: Secondary | ICD-10-CM | POA: Diagnosis not present

## 2020-09-30 DIAGNOSIS — J439 Emphysema, unspecified: Secondary | ICD-10-CM | POA: Diagnosis not present

## 2020-09-30 DIAGNOSIS — I10 Essential (primary) hypertension: Secondary | ICD-10-CM | POA: Diagnosis not present

## 2020-09-30 DIAGNOSIS — J45909 Unspecified asthma, uncomplicated: Secondary | ICD-10-CM | POA: Diagnosis not present

## 2020-09-30 DIAGNOSIS — I2510 Atherosclerotic heart disease of native coronary artery without angina pectoris: Secondary | ICD-10-CM | POA: Diagnosis not present

## 2020-09-30 DIAGNOSIS — G20 Parkinson's disease: Secondary | ICD-10-CM | POA: Diagnosis not present

## 2020-09-30 DIAGNOSIS — F329 Major depressive disorder, single episode, unspecified: Secondary | ICD-10-CM | POA: Diagnosis not present

## 2020-09-30 DIAGNOSIS — M80021D Age-related osteoporosis with current pathological fracture, right humerus, subsequent encounter for fracture with routine healing: Secondary | ICD-10-CM | POA: Diagnosis not present

## 2020-10-02 DIAGNOSIS — G20 Parkinson's disease: Secondary | ICD-10-CM | POA: Diagnosis not present

## 2020-10-02 DIAGNOSIS — M9741XD Periprosthetic fracture around internal prosthetic right elbow joint, subsequent encounter: Secondary | ICD-10-CM | POA: Diagnosis not present

## 2020-10-02 DIAGNOSIS — M1990 Unspecified osteoarthritis, unspecified site: Secondary | ICD-10-CM | POA: Diagnosis not present

## 2020-10-02 DIAGNOSIS — I2510 Atherosclerotic heart disease of native coronary artery without angina pectoris: Secondary | ICD-10-CM | POA: Diagnosis not present

## 2020-10-02 DIAGNOSIS — I10 Essential (primary) hypertension: Secondary | ICD-10-CM | POA: Diagnosis not present

## 2020-10-02 DIAGNOSIS — J45909 Unspecified asthma, uncomplicated: Secondary | ICD-10-CM | POA: Diagnosis not present

## 2020-10-02 DIAGNOSIS — F329 Major depressive disorder, single episode, unspecified: Secondary | ICD-10-CM | POA: Diagnosis not present

## 2020-10-02 DIAGNOSIS — J439 Emphysema, unspecified: Secondary | ICD-10-CM | POA: Diagnosis not present

## 2020-10-02 DIAGNOSIS — M80021D Age-related osteoporosis with current pathological fracture, right humerus, subsequent encounter for fracture with routine healing: Secondary | ICD-10-CM | POA: Diagnosis not present

## 2020-10-07 DIAGNOSIS — J439 Emphysema, unspecified: Secondary | ICD-10-CM | POA: Diagnosis not present

## 2020-10-07 DIAGNOSIS — M1990 Unspecified osteoarthritis, unspecified site: Secondary | ICD-10-CM | POA: Diagnosis not present

## 2020-10-07 DIAGNOSIS — F329 Major depressive disorder, single episode, unspecified: Secondary | ICD-10-CM | POA: Diagnosis not present

## 2020-10-07 DIAGNOSIS — M80021D Age-related osteoporosis with current pathological fracture, right humerus, subsequent encounter for fracture with routine healing: Secondary | ICD-10-CM | POA: Diagnosis not present

## 2020-10-07 DIAGNOSIS — G20 Parkinson's disease: Secondary | ICD-10-CM | POA: Diagnosis not present

## 2020-10-07 DIAGNOSIS — M9741XD Periprosthetic fracture around internal prosthetic right elbow joint, subsequent encounter: Secondary | ICD-10-CM | POA: Diagnosis not present

## 2020-10-07 DIAGNOSIS — I2510 Atherosclerotic heart disease of native coronary artery without angina pectoris: Secondary | ICD-10-CM | POA: Diagnosis not present

## 2020-10-07 DIAGNOSIS — I10 Essential (primary) hypertension: Secondary | ICD-10-CM | POA: Diagnosis not present

## 2020-10-07 DIAGNOSIS — J45909 Unspecified asthma, uncomplicated: Secondary | ICD-10-CM | POA: Diagnosis not present

## 2020-10-08 DIAGNOSIS — Z09 Encounter for follow-up examination after completed treatment for conditions other than malignant neoplasm: Secondary | ICD-10-CM | POA: Diagnosis not present

## 2020-10-14 DIAGNOSIS — I2510 Atherosclerotic heart disease of native coronary artery without angina pectoris: Secondary | ICD-10-CM | POA: Diagnosis not present

## 2020-10-14 DIAGNOSIS — M1990 Unspecified osteoarthritis, unspecified site: Secondary | ICD-10-CM | POA: Diagnosis not present

## 2020-10-14 DIAGNOSIS — M80021D Age-related osteoporosis with current pathological fracture, right humerus, subsequent encounter for fracture with routine healing: Secondary | ICD-10-CM | POA: Diagnosis not present

## 2020-10-14 DIAGNOSIS — F329 Major depressive disorder, single episode, unspecified: Secondary | ICD-10-CM | POA: Diagnosis not present

## 2020-10-14 DIAGNOSIS — J439 Emphysema, unspecified: Secondary | ICD-10-CM | POA: Diagnosis not present

## 2020-10-14 DIAGNOSIS — M9741XD Periprosthetic fracture around internal prosthetic right elbow joint, subsequent encounter: Secondary | ICD-10-CM | POA: Diagnosis not present

## 2020-10-14 DIAGNOSIS — J45909 Unspecified asthma, uncomplicated: Secondary | ICD-10-CM | POA: Diagnosis not present

## 2020-10-14 DIAGNOSIS — I10 Essential (primary) hypertension: Secondary | ICD-10-CM | POA: Diagnosis not present

## 2020-10-14 DIAGNOSIS — G20 Parkinson's disease: Secondary | ICD-10-CM | POA: Diagnosis not present

## 2020-11-14 ENCOUNTER — Other Ambulatory Visit: Payer: Self-pay | Admitting: Physician Assistant

## 2020-11-14 ENCOUNTER — Other Ambulatory Visit: Payer: Self-pay | Admitting: Family Medicine

## 2020-11-14 DIAGNOSIS — F419 Anxiety disorder, unspecified: Secondary | ICD-10-CM

## 2020-11-19 ENCOUNTER — Other Ambulatory Visit: Payer: Self-pay | Admitting: Family Medicine

## 2020-11-26 DIAGNOSIS — Z09 Encounter for follow-up examination after completed treatment for conditions other than malignant neoplasm: Secondary | ICD-10-CM | POA: Diagnosis not present

## 2020-12-02 ENCOUNTER — Other Ambulatory Visit: Payer: Self-pay | Admitting: Family Medicine

## 2020-12-02 NOTE — Telephone Encounter (Signed)
Requested Prescriptions  Pending Prescriptions Disp Refills  . doxepin (SINEQUAN) 25 MG capsule [Pharmacy Med Name: DOXEPIN HCL 25 MG CAP] 90 capsule 0    Sig: TAKE 1 CAPSULE BY MOUTH AT BEDTIME     Psychiatry:  Antidepressants - Heterocyclics (TCAs) Passed - 12/02/2020  4:20 PM      Passed - Valid encounter within last 6 months    Recent Outpatient Visits          4 months ago Benign essential HTN   [facility] [place], Dionne Bucy, MD   10 months ago Encounter for annual physical exam   [facility] [place], Dionne Bucy, MD   1 year ago Benign essential HTN   [facility] [place], Dionne Bucy, MD   1 year ago Benign essential HTN   [facility] [place], Dionne Bucy, MD   1 year ago [place], Wendee Beavers, [place]      Future Appointments            In 1 month Bacigalupo, Dionne Bucy, MD [facility], [place]

## 2020-12-10 ENCOUNTER — Other Ambulatory Visit: Payer: Self-pay | Admitting: Hospice and Palliative Medicine

## 2020-12-10 DIAGNOSIS — J301 Allergic rhinitis due to pollen: Secondary | ICD-10-CM

## 2020-12-12 DIAGNOSIS — S52221K Displaced transverse fracture of shaft of right ulna, subsequent encounter for closed fracture with nonunion: Secondary | ICD-10-CM | POA: Diagnosis not present

## 2020-12-20 DIAGNOSIS — M810 Age-related osteoporosis without current pathological fracture: Secondary | ICD-10-CM | POA: Diagnosis not present

## 2020-12-25 ENCOUNTER — Other Ambulatory Visit: Payer: Self-pay | Admitting: Internal Medicine

## 2020-12-25 ENCOUNTER — Other Ambulatory Visit: Payer: Self-pay | Admitting: Family Medicine

## 2021-01-13 ENCOUNTER — Other Ambulatory Visit: Payer: Self-pay | Admitting: Hospice and Palliative Medicine

## 2021-01-13 DIAGNOSIS — J301 Allergic rhinitis due to pollen: Secondary | ICD-10-CM

## 2021-01-14 DIAGNOSIS — M8588 Other specified disorders of bone density and structure, other site: Secondary | ICD-10-CM | POA: Diagnosis not present

## 2021-01-16 ENCOUNTER — Telehealth: Payer: Self-pay

## 2021-01-16 ENCOUNTER — Other Ambulatory Visit: Payer: Self-pay

## 2021-01-16 DIAGNOSIS — J449 Chronic obstructive pulmonary disease, unspecified: Secondary | ICD-10-CM

## 2021-01-16 NOTE — Telephone Encounter (Signed)
Copied from [place] [id number]. Topic: Quick Sport and exercise psychologist Patient (Clinic Use ONLY) >> Jan 16, 2021  1:10 PM Lennox Solders wrote: Reason for CRM: Pt is returning [place] call she just missed the call

## 2021-01-22 ENCOUNTER — Ambulatory Visit: Payer: Medicare PPO | Admitting: Internal Medicine

## 2021-01-22 DIAGNOSIS — J449 Chronic obstructive pulmonary disease, unspecified: Secondary | ICD-10-CM

## 2021-01-22 DIAGNOSIS — R0602 Shortness of breath: Secondary | ICD-10-CM | POA: Diagnosis not present

## 2021-01-22 LAB — PULMONARY FUNCTION TEST

## 2021-01-22 NOTE — Progress Notes (Unsigned)
Subjective:   Lori Brady is a 75 y.o. female who presents for Medicare Annual (Subsequent) preventive examination.  I connected with Arlyss Repress today by telephone and verified that I am speaking with the correct person using two identifiers. Location patient: home Location provider: work Persons participating in the virtual visit: patient, provider.   I discussed the limitations, risks, security and privacy concerns of performing an evaluation and management service by telephone and the availability of in person appointments. I also discussed with the patient that there may be a patient responsible charge related to this service. The patient expressed understanding and verbally consented to this telephonic visit.    Interactive audio and video telecommunications were attempted between this provider and patient, however failed, due to patient having technical difficulties OR patient did not have access to video capability.  We continued and completed visit with audio only.   Review of Systems    N/A  Cardiac Risk Factors include: advanced age (>79mn, >>66women);hypertension;dyslipidemia     Objective:    Today's Vitals   01/23/21 1320  PainSc: 2    There is no height or weight on file to calculate BMI.  Advanced Directives 01/23/2021 01/22/2020 10/25/2019 09/21/2019 01/20/2019 07/16/2018 02/07/2018  Does Patient Have a Medical Advance Directive? _0  No No  Would patient like information on creating a medical advance directive? No - Patient declined No - Patient declined No - Patient declined - No - Patient declined No - Patient declined No - Patient declined    Current Medications (verified) Outpatient Encounter Medications as of 01/23/2021  Medication Sig  . acetaminophen (TYLENOL) 500 MG tablet Take 500 mg by mouth every 6 (six) hours as needed.   .Marland Kitchenalbuterol (ACCUNEB) 0.63 MG/3ML nebulizer solution Take 1 ampule by nebulization every 6 (six) hours as needed for  wheezing.  .Marland Kitchenalbuterol (VENTOLIN HFA) 108 (90 Base) MCG/ACT inhaler INHALE 2 PUFFS INTO THE LUNGS EVERY 6 HOURS AS NEEDED FOR WHEEZING OR SHORTNESS OF BREATH  . alendronate (FOSAMAX) 70 MG tablet One tab every Sunday on empty stomach with a full glass of water. Do not lie down or eat/drink anything else for the next 30 min.  .Marland Kitchenaspirin 81 MG tablet Take 81 mg by mouth daily.  .Marland Kitchenatorvastatin (LIPITOR) 40 MG tablet TAKE 1 TABLET BY MOUTH AT BEDTIME  . azelastine (ASTELIN) 0.1 % nasal spray Place 2 sprays into both nostrils daily at 6 (six) AM.  . betamethasone valerate (VALISONE) 0.1 % cream APPLY TOPICALLY TO AFFECTED AREA SPARINGLY 2 TIMES DAILY.  .Marland KitchenBREO ELLIPTA 100-25 MCG/INH AEPB INHALE 1 PUFF BY MOUTH ONCE DAILY AS DIRECTED  . carbidopa-levodopa (SINEMET CR) 50-200 MG tablet Take 1 tablet by mouth daily. Before dose  . carbidopa-levodopa (SINEMET IR) 25-100 MG tablet Take 2 tablets by mouth 3 (three) times daily.   . cetirizine (ZYRTEC) 10 MG tablet TAKE 1 TABLET BY MOUTH ONCE DAILY  . Cholecalciferol (VITAMIN D) 2000 UNITS CAPS Take 1 capsule by mouth daily.  . citalopram (CELEXA) 20 MG tablet TAKE 1 TABLET BY MOUTH ONCE DAILY  . clobetasol cream (TEMOVATE) 05.37% Apply 1 application topically 2 (two) times daily. As needed  . doxepin (SINEQUAN) 25 MG capsule TAKE 1 CAPSULE BY MOUTH AT BEDTIME  . fluticasone (FLONASE) 50 MCG/ACT nasal spray PLACE 2 SPRAYS INTO BOTHS NOSTRILS DAILY  . ketoconazole (NIZORAL) 2 % shampoo Apply 1 application topically 2 (two) times a week.  . montelukast (SINGULAIR)  10 MG tablet TAKE 1 TABLET BY MOUTH ONCE DAILY  . omeprazole (PRILOSEC) 40 MG capsule Take 40 mg by mouth daily.   . ondansetron (ZOFRAN ODT) 4 MG disintegrating tablet Take 1 tablet (4 mg total) by mouth every 8 (eight) hours as needed.  . potassium chloride SA (KLOR-CON) 20 MEQ tablet TAKE 1 TABLET BY MOUTH ONCE DAILY  . Tiotropium Bromide Monohydrate (SPIRIVA RESPIMAT) 2.5 MCG/ACT AERS USE 2 PUFFS  BY MOUTH ONCE DAILY FOR COPD  . clobetasol (TEMOVATE) 0.05 % external solution Apply 1 application topically 2 (two) times daily. As needed (Patient not taking: Reported on 01/23/2021)  . ibuprofen (ADVIL) 600 MG tablet ibuprofen 600 mg tablet  Take 1 tablet 3 times a day by oral route. (Patient not taking: Reported on 01/23/2021)  . ketorolac (TORADOL) 10 MG tablet Take 10 mg by mouth every 4 (four) hours as needed. (Patient not taking: Reported on 01/23/2021)  . nystatin (MYCOSTATIN) 100000 UNIT/ML suspension Take 5 mLs (500,000 Units total) by mouth 4 (four) times daily. Until resolution (Patient not taking: Reported on 01/23/2021)   No facility-administered encounter medications on file as of 01/23/2021.    Allergies (verified) Levofloxacin and Vicodin [hydrocodone-acetaminophen]   History: Past Medical History:  Diagnosis Date  . Arthritis   . Asthma   . Crohn's disease ([place])   . Emphysema of lung ([place])   . GERD (gastroesophageal reflux disease)   . Hip fracture ([place]) 01/26/2017  . History of chicken pox   . History of heart attack   . Hx of completed stroke   . Hypercholesteremia   . Hypertension   . Parkinson disease ([place])   . Pneumonia   . Seasonal allergies   . Sepsis ([place])   . Septic shock ([place]) 10/08/2016  . UTI (lower urinary tract infection)    Past Surgical History:  Procedure Laterality Date  . BREAST BIOPSY Bilateral 1960's to 90's  . breast cysts    . CATARACT EXTRACTION, BILATERAL    . ELBOW SURGERY     right  . right wrist fracture    . TUBAL LIGATION    . WRIST SURGERY Left    Family History  Problem Relation Age of Onset  . Cancer Sister        ovarian/uterine/breast  . Breast cancer Sister 55  . Cancer Sister        breast  . Heart disease Sister   . Breast cancer Sister 38  . Heart disease Father   . Cancer Father        liver  . Pancreatic cancer Mother   . Heart disease Brother   . Heart attack Brother   . Cancer Brother        liver  .  Heart disease Sister   . Diabetes Maternal Grandmother    Social History   Socioeconomic History  . Marital status: Widowed    Spouse name: Not on file  . Number of children: 3  . Years of education: Not on file  . Highest education level: Some college, no degree  Occupational History  . Occupation: retired  Tobacco Use  . Smoking status: Former Smoker    Packs/day: 1.00    Years: 30.00    Pack years: 30.00    Types: Cigarettes    Quit date: 08/01/2008    Years since quitting: 12.4  . Smokeless tobacco: Never Used  Vaping Use  . Vaping Use: Never used  Substance and Sexual Activity  . Alcohol  use: Yes    Alcohol/week: 1.0 standard drink    Types: 1 Shots of liquor per week  . Drug use: No  . Sexual activity: Not on file  Other Topics Concern  . Not on file  Social History Narrative  . Not on file   Social Determinants of Health   Financial Resource Strain: Low Risk   . Difficulty of Paying Living Expenses: Not hard at all  Food Insecurity: No Food Insecurity  . Worried About Charity fundraiser in the Last Year: Never true  . Ran Out of Food in the Last Year: Never true  Transportation Needs: No Transportation Needs  . Lack of Transportation (Medical): No  . Lack of Transportation (Non-Medical): No  Physical Activity: Inactive  . Days of Exercise per Week: 0 days  . Minutes of Exercise per Session: 0 min  Stress: No Stress Concern Present  . Feeling of Stress : Not at all  Social Connections: Socially Isolated  . Frequency of Communication with Friends and Family: More than three times a week  . Frequency of Social Gatherings with Friends and Family: Twice a week  . Attends Religious Services: Never  . Active Member of Clubs or Organizations: No  . Attends Archivist Meetings: Never  . Marital Status: Widowed    Tobacco Counseling Counseling given: Not Answered   Clinical Intake:  Pre-visit preparation completed: Yes  Pain : 0-10 Pain  Score: 2  Pain Type: Chronic pain Pain Location: Arm Pain Orientation: Right Pain Descriptors / Indicators: Sore Pain Frequency: Constant Pain Relieving Factors: Taking ES Tylenol as needed.  Pain Relieving Factors: Taking ES Tylenol as needed.  Nutritional Risks: None Diabetes: No  How often do you need to have someone help you when you read instructions, pamphlets, or other written materials from your doctor or pharmacy?: 1 - Never  Diabetic? No  Interpreter Needed?: No  Information entered by :: [facility], LPN   Activities of Daily Living In your present state of health, do you have any difficulty performing the following activities: 01/23/2021  Hearing? Y  Comment Wears bilateral hearing aids.  Vision? N  Difficulty concentrating or making decisions? N  Walking or climbing stairs? Y  Comment Due to trouble walking from Parkinsons.  Dressing or bathing? N  Doing errands, shopping? Y  Comment Does not drive currently.  Preparing Food and eating ? Y  Comment Sister does all the cooking.  Using the Toilet? N  In the past six months, have you accidently leaked urine? Y  Comment Wears depends at night.  Do you have problems with loss of bowel control? N  Managing your Medications? N  Managing your Finances? N  Housekeeping or managing your Housekeeping? Y  Comment Sister manages all housework.  Some recent data might be hidden    Patient Care Team: Virginia Crews, MD as PCP - General (Family Medicine) Corey Skains, MD as Consulting Physician (Cardiology) Jannet Mantis, MD as Consulting Physician (Dermatology) Allyne Gee, MD as Consulting Physician (Internal Medicine) Katy Apo, MD as Consulting Physician (Ophthalmology) Gabriel Carina Betsey Holiday, MD as Physician Assistant (Endocrinology) Vladimir Crofts, MD as Consulting Physician (Neurology) Rutherford Limerick, MD as Attending Physician (Orthopedic Surgery)  Indicate any recent Medical  Services you may have received from other than [facility] providers in the past year (date may be approximate).     Assessment:   This is a routine wellness examination for Demaya.  Hearing/Vision screen No exam  data present  Dietary issues and exercise activities discussed: Current Exercise Habits: The patient does not participate in regular exercise at present, Exercise limited by: Other - see comments (Parkinsons)  Goals    . Improve Ability to Self-Manage Chronic Conditions     Current Barriers:  . Chronic Disease Management support and education needs related to Hypertension, COPD, Parkinson's and CAD. (Recent falls resulting in injury to both upper extremities)   Case Manager Clinical Goal(s):  Marland Kitchen Over the next 90 days, patient will not require unexpected hospital admission. . Over the next 90 days, patient will take all medications as prescribed. . Over the next 90 days, patient will notify provider if status changes and with worsening s/sx related to cardiac or respiratory complications. . Over the next 90 days, patient will work with team to determine level of care concerns and possible need for additional assistance in the home. . Over the next 90 days, patient will adhere to recommended safety measures to prevent additional injuries. . Over the next 90 days, patient will attend all medical appointments as schedule. .   Interventions:  . Reviewed medications. Encouraged to take medications as prescribed. Family is currently assisting with medication preparation and administration due patient being unable to use her upper extremities. Denies concerns regarding prescription costs. . Discussed worsening s/sx of complications related to cardiac and respiratory emergencies. Discussed indications for seeking immediate medical attention. Reports being unable to monitor BP or complete routine care d/t injuries to both upper extremities. . Reports productive cough with "clear to white" sputum.  Denies shortness of breath or chest discomfort. Verbalized awareness of indications for notifying MD . Reviewed safety measures and provider recommendations regarding mobility. Reports being primarily wheelchair bound since recent injuries. Reports previously using walker or assistive device. Currently has hard cast to left hand and brace to right elbow. Family members are currently assisting with ADLs. Agreeable to agencies being contacted to provide additional in-home assistance if she qualifies. . Reviewed pending appointments. Encouraged to attend appointments as scheduled to prevent delays in care. Reports son, Remo Lipps is currently providing transportation to appointments. . Discussed plans for ongoing care management and follow up. Provided direct contact information for care management team.  Patient Self Care Activities: (Recent falls resulting in injuries to both upper extremities) . Unable to self administer medications as prescribed . Unable to perform ADLs independently . Unable to perform IADLs independently  Initial goal documentation     . Weight (lb) < 130 lb (59 kg)     Recommend eating 3 small meals with two healthy protein snacks in between.       Depression Screen PHQ 2/9 Scores 01/23/2021 01/22/2020 01/20/2019 06/06/2018 01/04/2018 04/01/2017 04/01/2017  PHQ - 2 Score 1 0 1 0 1 0 2  PHQ- 9 Score - - - - - - 6    Fall Risk Fall Risk  01/23/2021 01/22/2020 10/31/2019 10/31/2019 01/20/2019  Falls in the past year? 0 1 - 1 1  Comment - - - - -  Number falls in past yr: 0 1 - 1 0  Comment - - - - "passed out"  Injury with Fall? 0 1 - 1 0  Comment - - - - -  Risk Factor Category  - - - - -  Risk for fall due to : - Impaired mobility;Impaired balance/gait Impaired balance/gait;Medication side effect;Orthopedic patient;Other (Comment);History of fall(s) - Medication side effect;Other (Comment)  Risk for fall due to: Comment - Due to Parkinsons. - -  illness  Follow up - Falls prevention  discussed Falls prevention discussed Falls prevention discussed -    FALL RISK PREVENTION PERTAINING TO THE HOME:  Any stairs in or around the home? No  If so, are there any without handrails? No  Home free of loose throw rugs in walkways, pet beds, electrical cords, etc? Yes  Adequate lighting in your home to reduce risk of falls? Yes   ASSISTIVE DEVICES UTILIZED TO PREVENT FALLS:  Life alert? No  Use of a cane, walker or w/c? Yes  Grab bars in the bathroom? Yes  Shower chair or bench in shower? Yes  Elevated toilet seat or a handicapped toilet? Yes    Cognitive Function: Normal cognitive status assessed by direct observation by this Nurse Health Advisor. No abnormalities found.      6CIT Screen 01/22/2020  What Year? 0 points  What month? 0 points  What time? 0 points  Count back from 20 0 points  Months in reverse 0 points  Repeat phrase 0 points  Total Score 0    Immunizations Immunization History  Administered Date(s) Administered  . Fluad Quad(high Dose 65+) 09/05/2019  . Influenza Inj Mdck Quad Pf 07/23/2020  . Influenza Split 10/28/2011, 08/10/2012  . Influenza, High Dose Seasonal PF 08/21/2014, 08/29/2015  . Influenza,inj,Quad PF,6+ Mos 08/16/2013  . Influenza-Unspecified 08/16/2014, 08/11/2018  . PFIZER(Purple Top)SARS-COV-2 Vaccination 02/01/2020, 02/27/2020  . Pneumococcal Conjugate-13 01/10/2015  . Pneumococcal Polysaccharide-23 05/29/2013  . Tdap 04/01/2017  . Zoster 08/10/2012    TDAP status: Up to date  Flu Vaccine status: Up to date  Pneumococcal vaccine status: Up to date  Covid-19 vaccine status: Completed vaccines  Qualifies for Shingles Vaccine? Yes   Zostavax completed Yes   Shingrix Completed?: No.    Education has been provided regarding the importance of this vaccine. Patient has been advised to call insurance company to determine out of pocket expense if they have not yet received this vaccine. Advised may also receive vaccine at local  pharmacy or Health Dept. Verbalized acceptance and understanding.  Screening Tests Health Maintenance  Topic Date Due  . MAMMOGRAM  06/17/2018  . COLONOSCOPY (Pts 45-73yr Insurance coverage will need to be confirmed)  04/21/2020  . COVID-19 Vaccine (3 - Booster for Pfizer series) 08/28/2020  . DEXA SCAN  01/15/2023  . TETANUS/TDAP  04/02/2027  . INFLUENZA VACCINE  Completed  . Hepatitis C Screening  Completed  . PNA vac Low Risk Adult  Completed  . HPV VACCINES  Aged Out    Health Maintenance  Health Maintenance Due  Topic Date Due  . MAMMOGRAM  06/17/2018  . COLONOSCOPY (Pts 45-410yrInsurance coverage will need to be confirmed)  04/21/2020  . COVID-19 Vaccine (3 - Booster for Pfizer series) 08/28/2020    Colorectal cancer screening: Currently due. Pt states she never received her cologuard kit. Reordered today.   Mammogram status: Ordered 07/25/20. Pt provided with contact info and advised to call to schedule appt.    Bone Density status: Completed 01/14/21. Results reflect: Bone density results: OSTEOPENIA. Repeat every 2 years.  Lung Cancer Screening: (Low Dose CT Chest recommended if Age 73-80ears, 30 pack-year currently smoking OR have quit w/in 15years.) does qualify.   Additional Screening:  Hepatitis C Screening: Up to date  Vision Screening: Recommended annual ophthalmology exams for early detection of glaucoma and other disorders of the eye. Is the patient up to date with their annual eye exam?  Yes  Who is the provider or  what is the name of the office in which the patient attends annual eye exams? Dr Prudencio Burly If pt is not established with a provider, would they like to be referred to a provider to establish care? No .   Dental Screening: Recommended annual dental exams for proper oral hygiene  Community Resource Referral / Chronic Care Management: CRR required this visit?  No   CCM required this visit?  No      Plan:     I have personally reviewed and  noted the following in the patient's chart:   . Medical and social history . Use of alcohol, tobacco or illicit drugs  . Current medications and supplements . Functional ability and status . Nutritional status . Physical activity . Advanced directives . List of other physicians . Hospitalizations, surgeries, and ER visits in previous 12 months . Vitals . Screenings to include cognitive, depression, and falls . Referrals and appointments  In addition, I have reviewed and discussed with patient certain preventive protocols, quality metrics, and best practice recommendations. A written personalized care plan for preventive services as well as general preventive health recommendations were provided to patient.     McKenzie Mulat, [place]   1/61/0960   Nurse Notes: Pt would like to receive her Covid booster at next in office apt. Pt plans to call and schedule her mammogram for this year. Pt states she never received her cologuard kit. Re-ordered today.

## 2021-01-23 ENCOUNTER — Other Ambulatory Visit: Payer: Self-pay

## 2021-01-23 ENCOUNTER — Ambulatory Visit (INDEPENDENT_AMBULATORY_CARE_PROVIDER_SITE_OTHER): Payer: Medicare PPO

## 2021-01-23 DIAGNOSIS — Z0000 Encounter for general adult medical examination without abnormal findings: Secondary | ICD-10-CM

## 2021-01-23 DIAGNOSIS — Z1211 Encounter for screening for malignant neoplasm of colon: Secondary | ICD-10-CM | POA: Diagnosis not present

## 2021-01-23 NOTE — Patient Instructions (Signed)
Lori Brady , Thank you for taking time to come for your Medicare Wellness Visit. I appreciate your ongoing commitment to your health goals. Please review the following plan we discussed and let me know if I can assist you in the future.   Screening recommendations/referrals: Colonoscopy: Currently due, pt never received her cologuard kit. Will reorder today.  Mammogram: Currently due, pt to call and schedule apt.  Bone Density: Up to date, due 01/2023 Recommended yearly ophthalmology/optometry visit for glaucoma screening and checkup Recommended yearly dental visit for hygiene and checkup  Vaccinations: Influenza vaccine: Done 07/23/20 Pneumococcal vaccine: Completed series Tdap vaccine: Up to date, due 03/2027 Shingles vaccine: Shingrix discussed. Please contact your pharmacy for coverage information.     Advanced directives: Please bring a copy of your POA (Power of Attorney) and/or Living Will to your next appointment.   Conditions/risks identified: Recommend eating 3 small meals with two healthy protein snacks in between.   Next appointment: 01/28/21 @ 11:20 AM with Dr Brita Romp    Preventive Care 33 Years and Older, Female Preventive care refers to lifestyle choices and visits with your health care provider that can promote health and wellness. What does preventive care include?  A yearly physical exam. This is also called an annual well check.  Dental exams once or twice a year.  Routine eye exams. Ask your health care provider how often you should have your eyes checked.  Personal lifestyle choices, including:  Daily care of your teeth and gums.  Regular physical activity.  Eating a healthy diet.  Avoiding tobacco and drug use.  Limiting alcohol use.  Practicing safe sex.  Taking low-dose aspirin every day.  Taking vitamin and mineral supplements as recommended by your health care provider. What happens during an annual well check? The services and screenings done  by your health care provider during your annual well check will depend on your age, overall health, lifestyle risk factors, and family history of disease. Counseling  Your health care provider may ask you questions about your:  Alcohol use.  Tobacco use.  Drug use.  Emotional well-being.  Home and relationship well-being.  Sexual activity.  Eating habits.  History of falls.  Memory and ability to understand (cognition).  Work and work Statistician.  Reproductive health. Screening  You may have the following tests or measurements:  Height, weight, and BMI.  Blood pressure.  Lipid and cholesterol levels. These may be checked every 5 years, or more frequently if you are over 51 years old.  Skin check.  Lung cancer screening. You may have this screening every year starting at age 60 if you have a 30-pack-year history of smoking and currently smoke or have quit within the past 15 years.  Fecal occult blood test (FOBT) of the stool. You may have this test every year starting at age 78.  Flexible sigmoidoscopy or colonoscopy. You may have a sigmoidoscopy every 5 years or a colonoscopy every 10 years starting at age 27.  Hepatitis C blood test.  Hepatitis B blood test.  Sexually transmitted disease (STD) testing.  Diabetes screening. This is done by checking your blood sugar (glucose) after you have not eaten for a while (fasting). You may have this done every 1-3 years.  Bone density scan. This is done to screen for osteoporosis. You may have this done starting at age 34.  Mammogram. This may be done every 1-2 years. Talk to your health care provider about how often you should have regular mammograms. Talk  with your health care provider about your test results, treatment options, and if necessary, the need for more tests. Vaccines  Your health care provider may recommend certain vaccines, such as:  Influenza vaccine. This is recommended every year.  Tetanus,  diphtheria, and acellular pertussis (Tdap, Td) vaccine. You may need a Td booster every 10 years.  Zoster vaccine. You may need this after age 32.  Pneumococcal 13-valent conjugate (PCV13) vaccine. One dose is recommended after age 43.  Pneumococcal polysaccharide (PPSV23) vaccine. One dose is recommended after age 59. Talk to your health care provider about which screenings and vaccines you need and how often you need them. This information is not intended to replace advice given to you by your health care provider. Make sure you discuss any questions you have with your health care provider. Document Released: 11/29/2015 Document Revised: 07/22/2016 Document Reviewed: 09/03/2015 Elsevier Interactive Patient Education  2017 [place] Prevention in the Home Falls can cause injuries. They can happen to people of all ages. There are many things you can do to make your home safe and to help prevent falls. What can I do on the outside of my home?  Regularly fix the edges of walkways and driveways and fix any cracks.  Remove anything that might make you trip as you walk through a door, such as a raised step or threshold.  Trim any bushes or trees on the path to your home.  Use bright outdoor lighting.  Clear any walking paths of anything that might make someone trip, such as rocks or tools.  Regularly check to see if handrails are loose or broken. Make sure that both sides of any steps have handrails.  Any raised decks and porches should have guardrails on the edges.  Have any leaves, snow, or ice cleared regularly.  Use sand or salt on walking paths during winter.  Clean up any spills in your garage right away. This includes oil or grease spills. What can I do in the bathroom?  Use night lights.  Install grab bars by the toilet and in the tub and shower. Do not use towel bars as grab bars.  Use non-skid mats or decals in the tub or shower.  If you need to sit down in  the shower, use a plastic, non-slip stool.  Keep the floor dry. Clean up any water that spills on the floor as soon as it happens.  Remove soap buildup in the tub or shower regularly.  Attach bath mats securely with double-sided non-slip rug tape.  Do not have throw rugs and other things on the floor that can make you trip. What can I do in the bedroom?  Use night lights.  Make sure that you have a light by your bed that is easy to reach.  Do not use any sheets or blankets that are too big for your bed. They should not hang down onto the floor.  Have a firm chair that has side arms. You can use this for support while you get dressed.  Do not have throw rugs and other things on the floor that can make you trip. What can I do in the kitchen?  Clean up any spills right away.  Avoid walking on wet floors.  Keep items that you use a lot in easy-to-reach places.  If you need to reach something above you, use a strong step stool that has a grab bar.  Keep electrical cords out of the way.  Do  not use floor polish or wax that makes floors slippery. If you must use wax, use non-skid floor wax.  Do not have throw rugs and other things on the floor that can make you trip. What can I do with my stairs?  Do not leave any items on the stairs.  Make sure that there are handrails on both sides of the stairs and use them. Fix handrails that are broken or loose. Make sure that handrails are as long as the stairways.  Check any carpeting to make sure that it is firmly attached to the stairs. Fix any carpet that is loose or worn.  Avoid having throw rugs at the top or bottom of the stairs. If you do have throw rugs, attach them to the floor with carpet tape.  Make sure that you have a light switch at the top of the stairs and the bottom of the stairs. If you do not have them, ask someone to add them for you. What else can I do to help prevent falls?  Wear shoes that:  Do not have high  heels.  Have rubber bottoms.  Are comfortable and fit you well.  Are closed at the toe. Do not wear sandals.  If you use a stepladder:  Make sure that it is fully opened. Do not climb a closed stepladder.  Make sure that both sides of the stepladder are locked into place.  Ask someone to hold it for you, if possible.  Clearly mark and make sure that you can see:  Any grab bars or handrails.  First and last steps.  Where the edge of each step is.  Use tools that help you move around (mobility aids) if they are needed. These include:  Canes.  Walkers.  Scooters.  Crutches.  Turn on the lights when you go into a dark area. Replace any light bulbs as soon as they burn out.  Set up your furniture so you have a clear path. Avoid moving your furniture around.  If any of your floors are uneven, fix them.  If there are any pets around you, be aware of where they are.  Review your medicines with your doctor. Some medicines can make you feel dizzy. This can increase your chance of falling. Ask your doctor what other things that you can do to help prevent falls. This information is not intended to replace advice given to you by your health care provider. Make sure you discuss any questions you have with your health care provider. Document Released: 08/29/2009 Document Revised: 04/09/2016 Document Reviewed: 12/07/2014 Elsevier Interactive Patient Education  2017 [employer].

## 2021-01-24 DIAGNOSIS — R2689 Other abnormalities of gait and mobility: Secondary | ICD-10-CM | POA: Diagnosis not present

## 2021-01-24 DIAGNOSIS — G20 Parkinson's disease: Secondary | ICD-10-CM | POA: Diagnosis not present

## 2021-01-24 DIAGNOSIS — Z8673 Personal history of transient ischemic attack (TIA), and cerebral infarction without residual deficits: Secondary | ICD-10-CM | POA: Diagnosis not present

## 2021-01-26 NOTE — Procedures (Signed)
[facility] [address] [place], [address]    Complete Pulmonary Function Testing Interpretation:  FINDINGS:  The forced vital capacity is mildly decreased.  The FEV1 was 1.14 L which is 59% of predicted and is moderately decreased.  FEV1 FVC ratio is mildly decreased.  Postbronchodilator there was no significant improvement in the FEV1 and clinical improvement may still occur.  Total lung capacity is mildly decreased.  Residual volume is normal residual internal capacity ratio is increased.  FRC is mildly decreased.  DLCO is normal.  IMPRESSION:  This pulmonary function study is consistent with moderate obstructive lung disease and mild restrictive pulmonary disease.  Clinical correlation is recommended  Allyne Gee, MD [facility] Pulmonary Critical Care Medicine Sleep Medicine

## 2021-01-27 ENCOUNTER — Ambulatory Visit: Payer: Medicare PPO | Admitting: Family Medicine

## 2021-01-28 ENCOUNTER — Encounter: Payer: Self-pay | Admitting: Family Medicine

## 2021-01-28 ENCOUNTER — Ambulatory Visit: Payer: Medicare PPO | Admitting: Family Medicine

## 2021-01-28 ENCOUNTER — Other Ambulatory Visit: Payer: Self-pay

## 2021-01-28 VITALS — BP 96/76 | HR 101 | Temp 98.0°F | Resp 16 | Ht 60.5 in | Wt 150.0 lb

## 2021-01-28 DIAGNOSIS — I429 Cardiomyopathy, unspecified: Secondary | ICD-10-CM

## 2021-01-28 DIAGNOSIS — I471 Supraventricular tachycardia: Secondary | ICD-10-CM

## 2021-01-28 DIAGNOSIS — Z0000 Encounter for general adult medical examination without abnormal findings: Secondary | ICD-10-CM | POA: Diagnosis not present

## 2021-01-28 DIAGNOSIS — J4489 Other specified chronic obstructive pulmonary disease: Secondary | ICD-10-CM

## 2021-01-28 DIAGNOSIS — I10 Essential (primary) hypertension: Secondary | ICD-10-CM | POA: Diagnosis not present

## 2021-01-28 DIAGNOSIS — Z1211 Encounter for screening for malignant neoplasm of colon: Secondary | ICD-10-CM | POA: Diagnosis not present

## 2021-01-28 DIAGNOSIS — J449 Chronic obstructive pulmonary disease, unspecified: Secondary | ICD-10-CM | POA: Diagnosis not present

## 2021-01-28 DIAGNOSIS — I428 Other cardiomyopathies: Secondary | ICD-10-CM

## 2021-01-28 DIAGNOSIS — E7800 Pure hypercholesterolemia, unspecified: Secondary | ICD-10-CM

## 2021-01-28 DIAGNOSIS — D649 Anemia, unspecified: Secondary | ICD-10-CM

## 2021-01-28 DIAGNOSIS — G20 Parkinson's disease: Secondary | ICD-10-CM

## 2021-01-28 DIAGNOSIS — K760 Fatty (change of) liver, not elsewhere classified: Secondary | ICD-10-CM | POA: Diagnosis not present

## 2021-01-28 DIAGNOSIS — G20C Parkinsonism, unspecified: Secondary | ICD-10-CM

## 2021-01-28 DIAGNOSIS — E876 Hypokalemia: Secondary | ICD-10-CM

## 2021-01-28 DIAGNOSIS — K5010 Crohn's disease of large intestine without complications: Secondary | ICD-10-CM

## 2021-01-28 DIAGNOSIS — D692 Other nonthrombocytopenic purpura: Secondary | ICD-10-CM

## 2021-01-28 DIAGNOSIS — I4719 Other supraventricular tachycardia: Secondary | ICD-10-CM

## 2021-01-28 NOTE — Assessment & Plan Note (Signed)
Chronic and stable.   

## 2021-01-28 NOTE — Assessment & Plan Note (Signed)
H/o Takotsubo stress-induced in 2009  f/b Cardiology Continue statin

## 2021-01-28 NOTE — Patient Instructions (Signed)
Preventive Care 73 Years and Older, Female Preventive care refers to lifestyle choices and visits with your health care provider that can promote health and wellness. This includes:  A yearly physical exam. This is also called an annual wellness visit.  Regular dental and eye exams.  Immunizations.  Screening for certain conditions.  Healthy lifestyle choices, such as: ? Eating a healthy diet. ? Getting regular exercise. ? Not using drugs or products that contain nicotine and tobacco. ? Limiting alcohol use. What can I expect for my preventive care visit? Physical exam Your health care provider will check your:  Height and weight. These may be used to calculate your BMI (body mass index). BMI is a measurement that tells if you are at a healthy weight.  Heart rate and blood pressure.  Body temperature.  Skin for abnormal spots. Counseling Your health care provider may ask you questions about your:  Past medical problems.  Family's medical history.  Alcohol, tobacco, and drug use.  Emotional well-being.  Home life and relationship well-being.  Sexual activity.  Diet, exercise, and sleep habits.  History of falls.  Memory and ability to understand (cognition).  Work and work Statistician.  Pregnancy and menstrual history.  Access to firearms. What immunizations do I need? Vaccines are usually given at various ages, according to a schedule. Your health care provider will recommend vaccines for you based on your age, medical history, and lifestyle or other factors, such as travel or where you work.   What tests do I need? Blood tests  Lipid and cholesterol levels. These may be checked every 5 years, or more often depending on your overall health.  Hepatitis C test.  Hepatitis B test. Screening  Lung cancer screening. You may have this screening every year starting at age 73 if you have a 30-pack-year history of smoking and currently smoke or have quit within  the past 15 years.  Colorectal cancer screening. ? All adults should have this screening starting at age 73 and continuing until age 73. ? Your health care provider may recommend screening at age 73 if you are at increased risk. ? You will have tests every 1-10 years, depending on your results and the type of screening test.  Diabetes screening. ? This is done by checking your blood sugar (glucose) after you have not eaten for a while (fasting). ? You may have this done every 1-3 years.  Mammogram. ? This may be done every 1-2 years. ? Talk with your health care provider about how often you should have regular mammograms.  Abdominal aortic aneurysm (AAA) screening. You may need this if you are a current or former smoker.  BRCA-related cancer screening. This may be done if you have a family history of breast, ovarian, tubal, or peritoneal cancers. Other tests  STD (sexually transmitted disease) testing, if you are at risk.  Bone density scan. This is done to screen for osteoporosis. You may have this done starting at age 73 Talk with your health care provider about your test results, treatment options, and if necessary, the need for more tests. Follow these instructions at home: Eating and drinking  Eat a diet that includes fresh fruits and vegetables, whole grains, lean protein, and low-fat dairy products. Limit your intake of foods with high amounts of sugar, saturated fats, and salt.  Take vitamin and mineral supplements as recommended by your health care provider.  Do not drink alcohol if your health care provider tells you not to drink.  If you drink alcohol: ? Limit how much you have to 0-1 drink a day. ? Be aware of how much alcohol is in your drink. In the [place] one drink equals one 12 oz bottle of beer (355 mL), one 5 oz glass of wine (148 mL), or one 1 oz glass of hard liquor (44 mL).   Lifestyle  Take daily care of your teeth and gums. Brush your teeth every morning  and night with fluoride toothpaste. Floss one time each day.  Stay active. Exercise for at least 30 minutes 5 or more days each week.  Do not use any products that contain nicotine or tobacco, such as cigarettes, e-cigarettes, and chewing tobacco. If you need help quitting, ask your health care provider.  Do not use drugs.  If you are sexually active, practice safe sex. Use a condom or other form of protection in order to prevent STIs (sexually transmitted infections).  Talk with your health care provider about taking a low-dose aspirin or statin.  Find healthy ways to cope with stress, such as: ? Meditation, yoga, or listening to music. ? Journaling. ? Talking to a trusted person. ? Spending time with friends and family. Safety  Always wear your seat belt while driving or riding in a vehicle.  Do not drive: ? If you have been drinking alcohol. Do not ride with someone who has been drinking. ? When you are tired or distracted. ? While texting.  Wear a helmet and other protective equipment during sports activities.  If you have firearms in your house, make sure you follow all gun safety procedures. What's next?  Visit your health care provider once a year for an annual wellness visit.  Ask your health care provider how often you should have your eyes and teeth checked.  Stay up to date on all vaccines. This information is not intended to replace advice given to you by your health care provider. Make sure you discuss any questions you have with your health care provider. Document Revised: 10/23/2020 Document Reviewed: 10/27/2018 Elsevier Patient Education  2021 [employer] Inc[employer]  

## 2021-01-28 NOTE — Assessment & Plan Note (Signed)
Continue to monitor LFTs

## 2021-01-28 NOTE — Assessment & Plan Note (Signed)
Well controlled Off of all antiHTN meds Recheck metabolic panel F/u in 12 months

## 2021-01-28 NOTE — Assessment & Plan Note (Signed)
Chronic and well controlled F/b Pulm Continue current meds

## 2021-01-28 NOTE — Progress Notes (Signed)
Complete physical exam   Patient: Lori Brady   DOB: [birth year]   75 y.o. Female  MRN: [id number] Visit Date: 01/28/2021  Today's healthcare provider: [name], MD   Chief Complaint  Patient presents with  . Annual Exam   Subjective    BRIANAH HOPSON is a 75 y.o. female who presents today for a complete physical exam.  She reports consuming a general diet. The patient does not participate in regular exercise at present. She generally feels fairly well. She reports sleeping well. She does not have additional problems to discuss today.   HPI    Past Medical History:  Diagnosis Date  . Arthritis   . Asthma   . Crohn's disease ([place])   . Emphysema of lung ([place])   . GERD (gastroesophageal reflux disease)   . Hip fracture ([place]) 01/26/2017  . History of chicken pox   . History of heart attack   . Hx of completed stroke   . Hypercholesteremia   . Hypertension   . Parkinson disease ([place])   . Pneumonia   . Seasonal allergies   . Sepsis ([place])   . Septic shock ([place]) 10/08/2016  . UTI (lower urinary tract infection)    Past Surgical History:  Procedure Laterality Date  . BREAST BIOPSY Bilateral 1960's to 90's  . breast cysts    . CATARACT EXTRACTION, BILATERAL    . ELBOW SURGERY     right  . right wrist fracture    . TUBAL LIGATION    . WRIST SURGERY Left    Social History   Socioeconomic History  . Marital status: Widowed    Spouse name: Not on file  . Number of children: 3  . Years of education: Not on file  . Highest education level: Some college, no degree  Occupational History  . Occupation: retired  Tobacco Use  . Smoking status: Former Smoker    Packs/day: 1.00    Years: 30.00    Pack years: 30.00    Types: Cigarettes    Quit date: 08/01/2008    Years since quitting: 12.5  . Smokeless tobacco: Never Used  Vaping Use  . Vaping Use: Never used  Substance and Sexual Activity  . Alcohol use: Yes    Alcohol/week: 1.0 standard drink     Types: 1 Shots of liquor per week  . Drug use: No  . Sexual activity: Not on file  Other Topics Concern  . Not on file  Social History Narrative  . Not on file   Social Determinants of Health   Financial Resource Strain: Low Risk   . Difficulty of Paying Living Expenses: Not hard at all  Food Insecurity: No Food Insecurity  . Worried About Charity fundraiser in the Last Year: Never true  . Ran Out of Food in the Last Year: Never true  Transportation Needs: No Transportation Needs  . Lack of Transportation (Medical): No  . Lack of Transportation (Non-Medical): No  Physical Activity: Inactive  . Days of Exercise per Week: 0 days  . Minutes of Exercise per Session: 0 min  Stress: No Stress Concern Present  . Feeling of Stress : Not at all  Social Connections: Socially Isolated  . Frequency of Communication with Friends and Family: More than three times a week  . Frequency of Social Gatherings with Friends and Family: Twice a week  . Attends Religious Services: Never  . Active Member of Clubs or Organizations: No  .  Attends Archivist Meetings: Never  . Marital Status: Widowed  Intimate Partner Violence: Not At Risk  . Fear of Current or Ex-Partner: No  . Emotionally Abused: No  . Physically Abused: No  . Sexually Abused: No   Family Status  Relation Name Status  . Sister  Alive  . Sister  Deceased at age 105  . Father  Deceased at age 73  . Brother  Alive  . Mother  Deceased at age 24       carcinoma  . Brother  Deceased  . Brother  Alive  . Sister  (Not Specified)  . MGM  (Not Specified)   Family History  Problem Relation Age of Onset  . Cancer Sister        ovarian/uterine/breast  . Breast cancer Sister 6  . Cancer Sister        breast  . Heart disease Sister   . Breast cancer Sister 67  . Heart disease Father   . Cancer Father        liver  . Pancreatic cancer Mother   . Heart disease Brother   . Heart attack Brother   . Cancer Brother         liver  . Heart disease Sister   . Diabetes Maternal Grandmother    Allergies  Allergen Reactions  . Levofloxacin Nausea Only and Nausea And Vomiting    Can take if given something for n/v  . Vicodin [Hydrocodone-Acetaminophen] Nausea Only    Patient Care Team: Virginia Crews, MD as PCP - General (Family Medicine) Corey Skains, MD as Consulting Physician (Cardiology) Jannet Mantis, MD as Consulting Physician (Dermatology) Allyne Gee, MD as Consulting Physician (Internal Medicine) Katy Apo, MD as Consulting Physician (Ophthalmology) Gabriel Carina Betsey Holiday, MD as Physician Assistant (Endocrinology) Vladimir Crofts, MD as Consulting Physician (Neurology) Rutherford Limerick, MD as Attending Physician (Orthopedic Surgery)   Medications: Outpatient Medications Prior to Visit  Medication Sig  . acetaminophen (TYLENOL) 500 MG tablet Take 500 mg by mouth every 6 (six) hours as needed.   Marland Kitchen albuterol (ACCUNEB) 0.63 MG/3ML nebulizer solution Take 1 ampule by nebulization every 6 (six) hours as needed for wheezing.  Marland Kitchen albuterol (VENTOLIN HFA) 108 (90 Base) MCG/ACT inhaler INHALE 2 PUFFS INTO THE LUNGS EVERY 6 HOURS AS NEEDED FOR WHEEZING OR SHORTNESS OF BREATH  . alendronate (FOSAMAX) 70 MG tablet One tab every Sunday on empty stomach with a full glass of water. Do not lie down or eat/drink anything else for the next 30 min.  Marland Kitchen aspirin 81 MG tablet Take 81 mg by mouth daily.  Marland Kitchen atorvastatin (LIPITOR) 40 MG tablet TAKE 1 TABLET BY MOUTH AT BEDTIME  . azelastine (ASTELIN) 0.1 % nasal spray Place 2 sprays into both nostrils daily at 6 (six) AM.  . betamethasone valerate (VALISONE) 0.1 % cream APPLY TOPICALLY TO AFFECTED AREA SPARINGLY 2 TIMES DAILY.  Marland Kitchen BREO ELLIPTA 100-25 MCG/INH AEPB INHALE 1 PUFF BY MOUTH ONCE DAILY AS DIRECTED  . carbidopa-levodopa (SINEMET CR) 50-200 MG tablet Take 1 tablet by mouth daily. Before dose  . cetirizine (ZYRTEC) 10 MG tablet TAKE 1 TABLET BY  MOUTH ONCE DAILY  . Cholecalciferol (VITAMIN D) 2000 UNITS CAPS Take 1 capsule by mouth daily.  . citalopram (CELEXA) 20 MG tablet TAKE 1 TABLET BY MOUTH ONCE DAILY  . clobetasol (TEMOVATE) 0.05 % external solution Apply 1 application topically 2 (two) times daily. As needed  . clobetasol cream (TEMOVATE)  1.75 % Apply 1 application topically 2 (two) times daily. As needed  . doxepin (SINEQUAN) 25 MG capsule TAKE 1 CAPSULE BY MOUTH AT BEDTIME  . entacapone (COMTAN) 200 MG tablet Take by mouth.  . fluticasone (FLONASE) 50 MCG/ACT nasal spray PLACE 2 SPRAYS INTO BOTHS NOSTRILS DAILY  . ketoconazole (NIZORAL) 2 % shampoo Apply 1 application topically 2 (two) times a week.  . montelukast (SINGULAIR) 10 MG tablet TAKE 1 TABLET BY MOUTH ONCE DAILY  . omeprazole (PRILOSEC) 40 MG capsule Take 40 mg by mouth daily.   . ondansetron (ZOFRAN ODT) 4 MG disintegrating tablet Take 1 tablet (4 mg total) by mouth every 8 (eight) hours as needed.  . potassium chloride SA (KLOR-CON) 20 MEQ tablet TAKE 1 TABLET BY MOUTH ONCE DAILY  . Tiotropium Bromide Monohydrate (SPIRIVA RESPIMAT) 2.5 MCG/ACT AERS USE 2 PUFFS BY MOUTH ONCE DAILY FOR COPD  . carbidopa-levodopa (SINEMET IR) 25-100 MG tablet Take 2 tablets by mouth 3 (three) times daily.   . [DISCONTINUED] ibuprofen (ADVIL) 600 MG tablet ibuprofen 600 mg tablet  Take 1 tablet 3 times a day by oral route. (Patient not taking: Reported on 01/28/2021)  . [DISCONTINUED] ketorolac (TORADOL) 10 MG tablet Take 10 mg by mouth every 4 (four) hours as needed. (Patient not taking: No sig reported)  . [DISCONTINUED] nystatin (MYCOSTATIN) 100000 UNIT/ML suspension Take 5 mLs (500,000 Units total) by mouth 4 (four) times daily. Until resolution (Patient not taking: Reported on 01/23/2021)   No facility-administered medications prior to visit.    Review of Systems  Constitutional: Negative.   HENT: Negative.   Eyes: Negative.   Respiratory: Negative.   Cardiovascular:  Negative.   Gastrointestinal: Negative.   Endocrine: Negative.   Genitourinary: Negative.   Musculoskeletal: Negative.   Skin: Negative.   Allergic/Immunologic: Negative.   Neurological: Negative.   Hematological: Negative.   Psychiatric/Behavioral: Negative.     Last CBC Lab Results  Component Value Date   WBC 8.2 07/25/2020   HGB 10.5 (L) 07/25/2020   HCT 33.1 (L) 07/25/2020   MCV 88 07/25/2020   MCH 28.0 07/25/2020   RDW 14.5 07/25/2020   PLT 194 09/09/8526   Last metabolic panel Lab Results  Component Value Date   GLUCOSE 109 (H) 07/25/2020   NA 138 07/25/2020   K 4.4 07/25/2020   CL 99 07/25/2020   CO2 25 07/25/2020   BUN 13 07/25/2020   CREATININE 0.82 07/25/2020   GFRNONAA 72 07/25/2020   GFRAA 83 07/25/2020   CALCIUM 9.5 07/25/2020   PHOS 3.1 08/24/2018   PROT 7.6 01/22/2020   ALBUMIN 4.6 01/22/2020   LABGLOB 3.0 01/22/2020   AGRATIO 1.5 01/22/2020   BILITOT 0.6 01/22/2020   ALKPHOS 143 (H) 01/22/2020   AST 25 01/22/2020   ALT 15 01/22/2020   ANIONGAP 10 07/18/2018   Last lipids Lab Results  Component Value Date   CHOL 145 01/22/2020   HDL 54 01/22/2020   LDLCALC 65 01/22/2020   TRIG 150 (H) 01/22/2020   CHOLHDL 2.7 01/22/2020   Last hemoglobin A1c Lab Results  Component Value Date   HGBA1C 5.3 01/26/2017   Last thyroid functions Lab Results  Component Value Date   TSH 1.580 01/04/2018   Last vitamin B12 and Folate Lab Results  Component Value Date   VITAMINB12 300 01/22/2020   FOLATE 13.0 01/22/2020      Objective    BP 96/76 (BP Location: Left Arm, Patient Position: Sitting, Cuff Size: Normal)   Pulse Marland Kitchen)  101   Temp 98 F (36.7 C) (Oral)   Resp 16   Ht 5' 0.5" (1.537 m)   Wt 150 lb (68 kg)   SpO2 96%   BMI 28.81 kg/m  BP Readings from Last 3 Encounters:  01/28/21 96/76  07/25/20 102/64  07/23/20 99/66   Wt Readings from Last 3 Encounters:  01/28/21 150 lb (68 kg)  07/25/20 152 lb (68.9 kg)  07/23/20 150 lb (68 kg)       Physical Exam Vitals reviewed.  Constitutional:      General: She is not in acute distress.    Appearance: Normal appearance. She is well-developed. She is not diaphoretic.  HENT:     Head: Normocephalic and atraumatic.  Eyes:     General: No scleral icterus.    Conjunctiva/sclera: Conjunctivae normal.  Neck:     Thyroid: No thyromegaly.  Cardiovascular:     Rate and Rhythm: Normal rate and regular rhythm.     Pulses: Normal pulses.     Heart sounds: Normal heart sounds. No murmur heard.   Pulmonary:     Effort: Pulmonary effort is normal. No respiratory distress.     Breath sounds: Normal breath sounds. No wheezing, rhonchi or rales.  Musculoskeletal:     Cervical back: Neck supple.     Right lower leg: No edema.     Left lower leg: No edema.  Lymphadenopathy:     Cervical: No cervical adenopathy.  Skin:    General: Skin is warm and dry.     Findings: No rash.  Neurological:     Mental Status: She is alert and oriented to person, place, and time. Mental status is at baseline.  Psychiatric:        Mood and Affect: Mood normal.        Behavior: Behavior normal.       Last depression screening scores PHQ 2/9 Scores 01/23/2021 01/22/2020 01/20/2019  PHQ - 2 Score 1 0 1  PHQ- 9 Score - - -   Last fall risk screening Fall Risk  01/23/2021  Falls in the past year? 0  Comment -  Number falls in past yr: 0  Comment -  Injury with Fall? 0  Comment -  Risk Factor Category  -  Risk for fall due to : -  Risk for fall due to: Comment -  Follow up -   Last Audit-C alcohol use screening Alcohol Use Disorder Test (AUDIT) 01/23/2021  1. How often do you have a drink containing alcohol? 2  2. How many drinks containing alcohol do you have on a typical day when you are drinking? 0  3. How often do you have six or more drinks on one occasion? 0  AUDIT-C Score 2  Alcohol Brief Interventions/Follow-up AUDIT Score <7 follow-up not indicated   A score of 3 or more in women,  and 4 or more in men indicates increased risk for alcohol abuse, EXCEPT if all of the points are from question 1   No results found for any visits on 01/28/21.  Assessment & Plan    Routine Health Maintenance and Physical Exam  Exercise Activities and Dietary recommendations Goals    . Improve Ability to Self-Manage Chronic Conditions     Current Barriers:  . Chronic Disease Management support and education needs related to Hypertension, COPD, Parkinson's and CAD. (Recent falls resulting in injury to both upper extremities)   Case Manager Clinical Goal(s):  Marland Kitchen Over the next 90 days,  patient will not require unexpected hospital admission. . Over the next 90 days, patient will take all medications as prescribed. . Over the next 90 days, patient will notify provider if status changes and with worsening s/sx related to cardiac or respiratory complications. . Over the next 90 days, patient will work with team to determine level of care concerns and possible need for additional assistance in the home. . Over the next 90 days, patient will adhere to recommended safety measures to prevent additional injuries. . Over the next 90 days, patient will attend all medical appointments as schedule. .   Interventions:  . Reviewed medications. Encouraged to take medications as prescribed. Family is currently assisting with medication preparation and administration due patient being unable to use her upper extremities. Denies concerns regarding prescription costs. . Discussed worsening s/sx of complications related to cardiac and respiratory emergencies. Discussed indications for seeking immediate medical attention. Reports being unable to monitor BP or complete routine care d/t injuries to both upper extremities. . Reports productive cough with "clear to white" sputum. Denies shortness of breath or chest discomfort. Verbalized awareness of indications for notifying MD . Reviewed safety measures and provider  recommendations regarding mobility. Reports being primarily wheelchair bound since recent injuries. Reports previously using walker or assistive device. Currently has hard cast to left hand and brace to right elbow. Family members are currently assisting with ADLs. Agreeable to agencies being contacted to provide additional in-home assistance if she qualifies. . Reviewed pending appointments. Encouraged to attend appointments as scheduled to prevent delays in care. Reports son, Remo Lipps is currently providing transportation to appointments. . Discussed plans for ongoing care management and follow up. Provided direct contact information for care management team.  Patient Self Care Activities: (Recent falls resulting in injuries to both upper extremities) . Unable to self administer medications as prescribed . Unable to perform ADLs independently . Unable to perform IADLs independently  Initial goal documentation     . Weight (lb) < 130 lb (59 kg)     Recommend eating 3 small meals with two healthy protein snacks in between.        Immunization History  Administered Date(s) Administered  . Fluad Quad(high Dose 65+) 09/05/2019  . Influenza Inj Mdck Quad Pf 07/23/2020  . Influenza Split 10/28/2011, 08/10/2012  . Influenza, High Dose Seasonal PF 08/21/2014, 08/29/2015  . Influenza,inj,Quad PF,6+ Mos 08/16/2013  . Influenza-Unspecified 08/16/2014, 08/11/2018  . PFIZER(Purple Top)SARS-COV-2 Vaccination 02/01/2020, 02/27/2020  . Pneumococcal Conjugate-13 01/10/2015  . Pneumococcal Polysaccharide-23 05/29/2013  . Tdap 04/01/2017  . Zoster 08/10/2012    Health Maintenance  Topic Date Due  . MAMMOGRAM  06/17/2018  . COLONOSCOPY (Pts 45-62yr Insurance coverage will need to be confirmed)  04/21/2020  . COVID-19 Vaccine (3 - Booster for Pfizer series) 08/28/2020  . DEXA SCAN  01/15/2023  . TETANUS/TDAP  04/02/2027  . INFLUENZA VACCINE  Completed  . Hepatitis C Screening  Completed  . PNA  vac Low Risk Adult  Completed  . HPV VACCINES  Aged Out    Discussed health benefits of physical activity, and encouraged her to engage in regular exercise appropriate for her age and condition.  Problem List Items Addressed This Visit      Cardiovascular and Mediastinum   Benign essential HTN    Well controlled Off of all antiHTN meds Recheck metabolic panel F/u in 12 months       Relevant Orders   Comprehensive metabolic panel   Atrial tachycardia ([place]  Rate controlled today F/b Cardiology      Primary cardiomyopathy [facility])    H/o Takotsubo stress-induced in 2009  f/b Cardiology Continue statin      Senile purpura ([place])    Chronic and stable        Respiratory   COPD (chronic obstructive pulmonary disease) with chronic bronchitis (HCC)    Chronic and well controlled F/b Pulm Continue current meds        Digestive   Fatty liver determined by biopsy    Continue to monitor LFTs      Relevant Orders   Comprehensive metabolic panel   Crohn's disease of colon without complication (HCC)    No recent flares Continue to follow with GI        Nervous and Auditory   Primary parkinsonism ([place])    Worsening recently New medication F/b Neuro      Relevant Medications   entacapone (COMTAN) 200 MG tablet     Other   Hypokalemia    Patient would like to be able to get off of K supplement Will recheck and determine if that's appropriate pending labs       Other Visit Diagnoses    Encounter for annual physical exam    -  Primary   Relevant Orders   Comprehensive metabolic panel   Lipid Panel With LDL/HDL Ratio   Hypercholesterolemia       Relevant Orders   Lipid Panel With LDL/HDL Ratio   Anemia, unspecified type       Relevant Orders   CBC   Colon cancer screening       Relevant Orders   Cologuard       Return in about 1 year (around 01/28/2022) for CPE, AWV.     I, [name], MD, have reviewed all documentation for this visit. The  documentation on 01/28/21 for the exam, diagnosis, procedures, and orders are all accurate and complete.   Bacigalupo, Dionne Bucy, MD, MPH [facility]

## 2021-01-28 NOTE — Assessment & Plan Note (Signed)
No recent flares Continue to follow with GI

## 2021-01-28 NOTE — Assessment & Plan Note (Signed)
Rate controlled today F/b Cardiology

## 2021-01-28 NOTE — Assessment & Plan Note (Signed)
Patient would like to be able to get off of K supplement Will recheck and determine if that's appropriate pending labs

## 2021-01-28 NOTE — Assessment & Plan Note (Signed)
Worsening recently New medication F/b Neuro

## 2021-01-29 LAB — COMPREHENSIVE METABOLIC PANEL
ALT: 23 IU/L (ref 0–32)
AST: 59 IU/L — ABNORMAL HIGH (ref 0–40)
Albumin/Globulin Ratio: 1.7 (ref 1.2–2.2)
Albumin: 4.8 g/dL — ABNORMAL HIGH (ref 3.7–4.7)
Alkaline Phosphatase: 142 IU/L — ABNORMAL HIGH (ref 44–121)
BUN/Creatinine Ratio: 19 (ref 12–28)
BUN: 15 mg/dL (ref 8–27)
Bilirubin Total: 0.7 mg/dL (ref 0.0–1.2)
CO2: 22 mmol/L (ref 20–29)
Calcium: 9.9 mg/dL (ref 8.7–10.3)
Chloride: 99 mmol/L (ref 96–106)
Creatinine, Ser: 0.79 mg/dL (ref 0.57–1.00)
Globulin, Total: 2.8 g/dL (ref 1.5–4.5)
Glucose: 108 mg/dL — ABNORMAL HIGH (ref 65–99)
Potassium: 4.3 mmol/L (ref 3.5–5.2)
Sodium: 140 mmol/L (ref 134–144)
Total Protein: 7.6 g/dL (ref 6.0–8.5)
eGFR: 79 mL/min/{1.73_m2} (ref >59)

## 2021-01-29 LAB — LIPID PANEL WITH LDL/HDL RATIO
Cholesterol, Total: 146 mg/dL (ref 100–199)
HDL: 40 mg/dL (ref >39)
LDL Chol Calc (NIH): 78 mg/dL (ref 0–99)
LDL/HDL Ratio: 2 ratio (ref 0.0–3.2)
Triglycerides: 161 mg/dL — ABNORMAL HIGH (ref 0–149)
VLDL Cholesterol Cal: 28 mg/dL (ref 5–40)

## 2021-01-29 LAB — CBC
Hematocrit: 33.2 % — ABNORMAL LOW (ref 34.0–46.6)
Hemoglobin: 10.1 g/dL — ABNORMAL LOW (ref 11.1–15.9)
MCH: 24.9 pg — ABNORMAL LOW (ref 26.6–33.0)
MCHC: 30.4 g/dL — ABNORMAL LOW (ref 31.5–35.7)
MCV: 82 fL (ref 79–97)
Platelets: 215 10*3/uL (ref 150–450)
RBC: 4.05 x10E6/uL (ref 3.77–5.28)
RDW: 15.8 % — ABNORMAL HIGH (ref 11.7–15.4)
WBC: 11.8 10*3/uL — ABNORMAL HIGH (ref 3.4–10.8)

## 2021-02-11 ENCOUNTER — Other Ambulatory Visit: Payer: Self-pay

## 2021-02-12 ENCOUNTER — Other Ambulatory Visit: Payer: Self-pay

## 2021-02-12 MED ORDER — SPIRIVA RESPIMAT 2.5 MCG/ACT IN AERS: INHALATION_SPRAY | RESPIRATORY_TRACT | 1 refills | Status: DC

## 2021-02-18 ENCOUNTER — Telehealth: Payer: Self-pay

## 2021-02-18 NOTE — Telephone Encounter (Signed)
Copied from [place] [id number]. Topic: General - Other >> Feb 18, 2021  3:23 PM Mcneil, Ja-Kwan wrote: Reason for CRM: Pt sister stated pt is having lots of pain in her left leg which she thinks is possibly sciatic nerve and she would like to know if a Rx for pain medication could be sent in to [place] [place]

## 2021-02-18 NOTE — Telephone Encounter (Signed)
Please advise 

## 2021-02-19 ENCOUNTER — Ambulatory Visit: Payer: Self-pay

## 2021-02-19 ENCOUNTER — Other Ambulatory Visit: Payer: Self-pay | Admitting: Physician Assistant

## 2021-02-19 MED ORDER — MELOXICAM 7.5 MG PO TABS: 7.5000 mg | ORAL_TABLET | Freq: Every day | ORAL | 0 refills | Status: DC

## 2021-02-19 NOTE — Telephone Encounter (Signed)
Pt. Started having left hip pain Monday. Starts in hip and shoots down her leg. Could not get out of bed yesterday because of the pain. Has tried OTC Motrin and Tylenol with no relief. Requested medication be sent to her pharmacy yesterday. Would like to be seen sooner than next as well if possible. Please advise pt.  Answer Assessment - Initial Assessment Questions 1. LOCATION and RADIATION: "Where is the pain located?"      Left hip 2. QUALITY: "What does the pain feel like?"  (e.g., sharp, dull, aching, burning)     Sharp 3. SEVERITY: "How bad is the pain?" "What does it keep you from doing?"   (Scale 1-10; or mild, moderate, severe)   -  MILD (1-3): doesn't interfere with normal activities    -  MODERATE (4-7): interferes with normal activities (e.g., work or school) or awakens from sleep, limping    -  SEVERE (8-10): excruciating pain, unable to do any normal activities, unable to walk     8 4. ONSET: "When did the pain start?" "Does it come and go, or is it there all the time?"     Monday 5. WORK OR EXERCISE: "Has there been any recent work or exercise that involved this part of the body?"      No 6. CAUSE: "What do you think is causing the hip pain?"      Unsure 7. AGGRAVATING FACTORS: "What makes the hip pain worse?" (e.g., walking, climbing stairs, running)     Walking 8. OTHER SYMPTOMS: "Do you have any other symptoms?" (e.g., back pain, pain shooting down leg,  fever, rash)     Shoots down leg  Protocols used: HIP PAIN-A-AH

## 2021-02-19 NOTE — Telephone Encounter (Signed)
Will send Meloxicam to [facility]. Not sure if anything available in office. We are very short staffed on providers. May can go to [facility] Urgent care that is open from 9-9 daily.

## 2021-02-20 DIAGNOSIS — M4316 Spondylolisthesis, lumbar region: Secondary | ICD-10-CM | POA: Diagnosis not present

## 2021-02-20 DIAGNOSIS — M5416 Radiculopathy, lumbar region: Secondary | ICD-10-CM | POA: Diagnosis not present

## 2021-02-20 NOTE — Telephone Encounter (Signed)
Left detailed voicemail. PEC please advise as below if patient calls back. Thank you.sd

## 2021-02-24 NOTE — Telephone Encounter (Signed)
Patient reports she was seen at [facility] and was treated.

## 2021-02-24 NOTE — Telephone Encounter (Signed)
If patient has not been seen for this problem, please set up with another provider

## 2021-02-25 ENCOUNTER — Ambulatory Visit: Payer: Medicare PPO | Admitting: Adult Health

## 2021-03-20 ENCOUNTER — Ambulatory Visit: Payer: Medicare PPO | Admitting: Internal Medicine

## 2021-03-21 ENCOUNTER — Other Ambulatory Visit: Payer: Self-pay | Admitting: Family Medicine

## 2021-03-21 ENCOUNTER — Other Ambulatory Visit: Payer: Self-pay | Admitting: Internal Medicine

## 2021-03-21 ENCOUNTER — Other Ambulatory Visit: Payer: Self-pay | Admitting: Physician Assistant

## 2021-03-21 DIAGNOSIS — J301 Allergic rhinitis due to pollen: Secondary | ICD-10-CM

## 2021-03-21 DIAGNOSIS — E7800 Pure hypercholesterolemia, unspecified: Secondary | ICD-10-CM

## 2021-03-21 NOTE — Telephone Encounter (Signed)
  Notes to clinic:  medication was last filled on 10/11/2020 Review for continued use and refill    Requested Prescriptions  Pending Prescriptions Disp Refills   atorvastatin (LIPITOR) 40 MG tablet [Pharmacy Med Name: ATORVASTATIN CALCIUM 40 MG TAB] 90 tablet 1    Sig: TAKE 1 TABLET BY MOUTH AT BEDTIME      Cardiovascular:  Antilipid - Statins Failed - 03/21/2021 11:41 AM      Failed - Triglycerides in normal range and within 360 days    Triglycerides  Date Value Ref Range Status  01/28/2021 161 (H) 0 - 149 mg/dL Final  12/07/2011 71 0 - 200 mg/dL Final          Passed - Total Cholesterol in normal range and within 360 days    Cholesterol, Total  Date Value Ref Range Status  01/28/2021 146 100 - 199 mg/dL Final   Cholesterol  Date Value Ref Range Status  12/07/2011 133 0 - 200 mg/dL Final          Passed - LDL in normal range and within 360 days    Ldl Cholesterol, Calc  Date Value Ref Range Status  12/07/2011 63 0 - 100 mg/dL Final   LDL Chol Calc (NIH)  Date Value Ref Range Status  01/28/2021 78 0 - 99 mg/dL Final          Passed - HDL in normal range and within 360 days    HDL Cholesterol  Date Value Ref Range Status  12/07/2011 56 40 - 60 mg/dL Final   HDL  Date Value Ref Range Status  01/28/2021 40 >39 mg/dL Final          Passed - Patient is not pregnant      Passed - Valid encounter within last 12 months    Recent Outpatient Visits           1 month ago Encounter for annual physical exam   [facility], Dionne Bucy, MD   7 months ago Benign essential HTN   [facility], Dionne Bucy, MD   1 year ago Encounter for annual physical exam   [facility], Dionne Bucy, MD   1 year ago Benign essential HTN   [facility] [place], Dionne Bucy, MD   1 year ago Benign essential HTN   [facility], Dionne Bucy, MD       Future Appointments              In 10 months Bacigalupo, Dionne Bucy, MD [facility] [place]

## 2021-04-03 ENCOUNTER — Encounter: Payer: Self-pay | Admitting: Internal Medicine

## 2021-04-03 ENCOUNTER — Other Ambulatory Visit: Payer: Self-pay

## 2021-04-03 ENCOUNTER — Ambulatory Visit: Payer: Medicare PPO | Admitting: Internal Medicine

## 2021-04-03 VITALS — BP 113/75 | HR 95 | Temp 97.8°F | Resp 16 | Ht 61.0 in | Wt 147.0 lb

## 2021-04-03 DIAGNOSIS — J449 Chronic obstructive pulmonary disease, unspecified: Secondary | ICD-10-CM | POA: Diagnosis not present

## 2021-04-03 DIAGNOSIS — M5432 Sciatica, left side: Secondary | ICD-10-CM | POA: Diagnosis not present

## 2021-04-03 DIAGNOSIS — G20 Parkinson's disease: Secondary | ICD-10-CM | POA: Diagnosis not present

## 2021-04-03 DIAGNOSIS — R0602 Shortness of breath: Secondary | ICD-10-CM

## 2021-04-03 NOTE — Progress Notes (Signed)
[facility] [place], [place] [address]  Pulmonary Sleep Medicine   Office Visit Note  Patient Name: Lori Brady DOB: 1946/08/31 MRN [id number]  Date of Service: 04/03/2021  Complaints/HPI: COPD Moderate she is on spiriva and breo with good control. Patient has been having severe pain in left buttocks area going down the back of her leg. She went to ortho and was given prednisone which she states did not help.  She states that the pain goes down it appears to be more or less sounds like it is sciatica.  She has not seen pain management and I think she might actually benefit from going to pain management for evaluation  ROS  General: (-) fever, (-) chills, (-) night sweats, (-) weakness Skin: (-) rashes, (-) itching,. Eyes: (-) visual changes, (-) redness, (-) itching. Nose and Sinuses: (-) nasal stuffiness or itchiness, (-) postnasal drip, (-) nosebleeds, (-) sinus trouble. Mouth and Throat: (-) sore throat, (-) hoarseness. Neck: (-) swollen glands, (-) enlarged thyroid, (-) neck pain. Respiratory: - cough, (-) bloody sputum, - shortness of breath, - wheezing. Cardiovascular: - ankle swelling, (-) chest pain. Lymphatic: (-) lymph node enlargement. Neurologic: (-) numbness, (-) tingling. Psychiatric: (-) anxiety, (-) depression   Current Medication: Outpatient Encounter Medications as of 04/03/2021  Medication Sig Note   acetaminophen (TYLENOL) 500 MG tablet Take 500 mg by mouth every 6 (six) hours as needed.     albuterol (ACCUNEB) 0.63 MG/3ML nebulizer solution Take 1 ampule by nebulization every 6 (six) hours as needed for wheezing.    albuterol (VENTOLIN HFA) 108 (90 Base) MCG/ACT inhaler INHALE 2 PUFFS INTO THE LUNGS EVERY 6 HOURS AS NEEDED FOR WHEEZING OR SHORTNESS OF BREATH    alendronate (FOSAMAX) 70 MG tablet One tab every Sunday on empty stomach with a full glass of water. Do not lie down or eat/drink anything else for the next 30 min.     aspirin 81 MG tablet Take 81 mg by mouth daily.    atorvastatin (LIPITOR) 40 MG tablet TAKE 1 TABLET BY MOUTH AT BEDTIME    azelastine (ASTELIN) 0.1 % nasal spray Place 2 sprays into both nostrils daily at 6 (six) AM.    betamethasone valerate (VALISONE) 0.1 % cream APPLY TOPICALLY TO AFFECTED AREA SPARINGLY 2 TIMES DAILY. 01/23/2021: As needed   BREO ELLIPTA 100-25 MCG/INH AEPB INHALE 1 PUFF BY MOUTH ONCE DAILY AS DIRECTED    carbidopa-levodopa (SINEMET CR) 50-200 MG tablet Take 1 tablet by mouth daily. Before dose    cetirizine (ZYRTEC) 10 MG tablet TAKE 1 TABLET BY MOUTH ONCE DAILY    Cholecalciferol (VITAMIN D) 2000 UNITS CAPS Take 1 capsule by mouth daily.    citalopram (CELEXA) 20 MG tablet TAKE 1 TABLET BY MOUTH ONCE DAILY    clobetasol (TEMOVATE) 0.05 % external solution Apply 1 application topically 2 (two) times daily. As needed    clobetasol cream (TEMOVATE) 0.48 % Apply 1 application topically 2 (two) times daily. As needed    doxepin (SINEQUAN) 25 MG capsule TAKE 1 CAPSULE BY MOUTH AT BEDTIME    entacapone (COMTAN) 200 MG tablet Take by mouth.    fluticasone (FLONASE) 50 MCG/ACT nasal spray PLACE 2 SPRAYS INTO BOTHS NOSTRILS DAILY    ketoconazole (NIZORAL) 2 % shampoo Apply 1 application topically 2 (two) times a week.    meloxicam (MOBIC) 7.5 MG tablet TAKE 1 TABLET BY MOUTH ONCE DAILY    montelukast (SINGULAIR) 10 MG tablet TAKE 1 TABLET  BY MOUTH ONCE DAILY    omeprazole (PRILOSEC) 40 MG capsule Take 40 mg by mouth daily.     ondansetron (ZOFRAN ODT) 4 MG disintegrating tablet Take 1 tablet (4 mg total) by mouth every 8 (eight) hours as needed.    potassium chloride SA (KLOR-CON) 20 MEQ tablet TAKE 1 TABLET BY MOUTH ONCE DAILY    Tiotropium Bromide Monohydrate (SPIRIVA RESPIMAT) 2.5 MCG/ACT AERS USE 2 PUFFS BY MOUTH ONCE DAILY FOR COPD    carbidopa-levodopa (SINEMET IR) 25-100 MG tablet Take 2 tablets by mouth 3 (three) times daily.     No facility-administered encounter  medications on file as of 04/03/2021.    Surgical History: Past Surgical History:  Procedure Laterality Date   BREAST BIOPSY Bilateral 1960's to 90's   breast cysts     CATARACT EXTRACTION, BILATERAL     ELBOW SURGERY     right   right wrist fracture     TUBAL LIGATION     WRIST SURGERY Left     Medical History: Past Medical History:  Diagnosis Date   Arthritis    Asthma    Crohn's disease ([place])    Emphysema of lung (HCC)    GERD (gastroesophageal reflux disease)    Hip fracture ([place]) 01/26/2017   History of chicken pox    History of heart attack    Hx of completed stroke    Hypercholesteremia    Hypertension    Parkinson disease ([place])    Pneumonia    Seasonal allergies    Sepsis ([place])    Septic shock ([place]) 10/08/2016   UTI (lower urinary tract infection)     Family History: Family History  Problem Relation Age of Onset   Cancer Sister        ovarian/uterine/breast   Breast cancer Sister 62   Cancer Sister        breast   Heart disease Sister    Breast cancer Sister 7   Heart disease Father    Cancer Father        liver   Pancreatic cancer Mother    Heart disease Brother    Heart attack Brother    Cancer Brother        liver   Heart disease Sister    Diabetes Maternal Grandmother     Social History: Social History   Socioeconomic History   Marital status: Widowed    Spouse name: Not on file   Number of children: 3   Years of education: Not on file   Highest education level: Some college, no degree  Occupational History   Occupation: retired  Tobacco Use   Smoking status: Former Smoker    Packs/day: 1.00    Years: 30.00    Pack years: 30.00    Types: Cigarettes    Quit date: 08/01/2008    Years since quitting: 12.6   Smokeless tobacco: Never Used  Vaping Use   Vaping Use: Never used  Substance and Sexual Activity   Alcohol use: Yes    Alcohol/week: 1.0 standard drink    Types: 1 Shots of liquor per week   Drug use: No   Sexual  activity: Not on file  Other Topics Concern   Not on file  Social History Narrative   Not on file   Social Determinants of Health   Financial Resource Strain: Low Risk    Difficulty of Paying Living Expenses: Not hard at all  Food Insecurity: No Food Insecurity   Worried  About Running Out of Food in the Last Year: Never true   Ran Out of Food in the Last Year: Never true  Transportation Needs: No Transportation Needs   Lack of Transportation (Medical): No   Lack of Transportation (Non-Medical): No  Physical Activity: Inactive   Days of Exercise per Week: 0 days   Minutes of Exercise per Session: 0 min  Stress: No Stress Concern Present   Feeling of Stress : Not at all  Social Connections: Socially Isolated   Frequency of Communication with Friends and Family: More than three times a week   Frequency of Social Gatherings with Friends and Family: Twice a week   Attends Religious Services: Never   Marine scientist or Organizations: No   Attends Archivist Meetings: Never   Marital Status: Widowed  Human resources officer Violence: Not At Risk   Fear of Current or Ex-Partner: No   Emotionally Abused: No   Physically Abused: No   Sexually Abused: No    Vital Signs: Blood pressure 113/75, pulse 95, temperature 97.8 F (36.6 C), resp. rate 16, height 5' 1"  (1.549 m), weight 147 lb (66.7 kg), SpO2 96 %.  Examination: General Appearance: The patient is well-developed, well-nourished, and in no distress. Skin: Gross inspection of skin unremarkable. Head: normocephalic, no gross deformities. Eyes: no gross deformities noted. ENT: ears appear grossly normal no exudates. Neck: Supple. No thyromegaly. No LAD. Respiratory: distant rhonchi noted. Cardiovascular: Normal S1 and S2 without murmur or rub. Extremities: No cyanosis. pulses are equal. Neurologic: Alert and oriented. No involuntary movements.  LABS: Recent Results (from the past 2160 hour(s))  Comprehensive  metabolic panel     Status: Abnormal   Collection Time: 01/28/21  1:05 PM  Result Value Ref Range   Glucose 108 (H) 65 - 99 mg/dL   BUN 15 8 - 27 mg/dL   Creatinine, Ser 0.79 0.57 - 1.00 mg/dL   eGFR 79 >59 mL/min/1.73   BUN/Creatinine Ratio 19 12 - 28   Sodium 140 134 - 144 mmol/L   Potassium 4.3 3.5 - 5.2 mmol/L   Chloride 99 96 - 106 mmol/L   CO2 22 20 - 29 mmol/L   Calcium 9.9 8.7 - 10.3 mg/dL   Total Protein 7.6 6.0 - 8.5 g/dL   Albumin 4.8 (H) 3.7 - 4.7 g/dL   Globulin, Total 2.8 1.5 - 4.5 g/dL   Albumin/Globulin Ratio 1.7 1.2 - 2.2   Bilirubin Total 0.7 0.0 - 1.2 mg/dL   Alkaline Phosphatase 142 (H) 44 - 121 IU/L   AST 59 (H) 0 - 40 IU/L   ALT 23 0 - 32 IU/L  Lipid Panel With LDL/HDL Ratio     Status: Abnormal   Collection Time: 01/28/21  1:05 PM  Result Value Ref Range   Cholesterol, Total 146 100 - 199 mg/dL   Triglycerides 161 (H) 0 - 149 mg/dL   HDL 40 >39 mg/dL   VLDL Cholesterol Cal 28 5 - 40 mg/dL   LDL Chol Calc (NIH) 78 0 - 99 mg/dL   LDL/HDL Ratio 2.0 0.0 - 3.2 ratio    Comment:                                     LDL/HDL Ratio  Men  Women                               1/2 Avg.Risk  1.0    1.5                                   Avg.Risk  3.6    3.2                                2X Avg.Risk  6.2    5.0                                3X Avg.Risk  8.0    6.1   CBC     Status: Abnormal   Collection Time: 01/28/21  1:05 PM  Result Value Ref Range   WBC 11.8 (H) 3.4 - 10.8 x10E3/uL   RBC 4.05 3.77 - 5.28 x10E6/uL   Hemoglobin 10.1 (L) 11.1 - 15.9 g/dL   Hematocrit 33.2 (L) 34.0 - 46.6 %   MCV 82 79 - 97 fL   MCH 24.9 (L) 26.6 - 33.0 pg   MCHC 30.4 (L) 31.5 - 35.7 g/dL   RDW 15.8 (H) 11.7 - 15.4 %   Platelets 215 150 - 450 x10E3/uL    Radiology: MR BRAIN/IAC W WO CONTRAST  Result Date: 05/31/2020 CLINICAL DATA:  Four months of right-sided hearing loss, more recently becoming bilateral. EXAM: MRI HEAD WITHOUT  AND WITH CONTRAST TECHNIQUE: Multiplanar, multiecho pulse sequences of the brain and surrounding structures were obtained without and with intravenous contrast. CONTRAST:  5m GADAVIST GADOBUTROL 1 MMOL/ML IV SOLN COMPARISON:  11/03/2017 FINDINGS: Brain: Remote perforator infarct on the left with wallerian degeneration seen into the brainstem. Chronic small vessel ischemia in the cerebral white matter. Nonspecific cerebral volume loss. Negative for retrocochlear lesion or labyrinthine signal abnormality. Clear CP angle cisterns. Vascular: Normal flow voids and vascular enhancements Skull and upper cervical spine: Normal marrow signal Sinuses/Orbits: Bilateral cataract resection. Mild mucosal thickening in the paranasal sinuses that is generalized. IMPRESSION: 1. Negative for retrocochlear lesion or other cause of symptoms. 2. Chronic small vessel ischemia with remote perforator infarct on the left. Electronically Signed   By: JMonte FantasiaM.D.   On: 05/31/2020 10:04    No results found.  No results found.    Assessment and Plan: Patient Active Problem List   Diagnosis Date Noted   Periprosthetic fracture around internal prosthetic right elbow joint 07/25/2020   Crohn's disease of colon without complication ([place] 103/47/4259  Hearing loss 06/01/2019   Vertigo 06/01/2019   Hypokalemia 07/22/2018   Oral thrush 07/22/2018   Gastroenteritis 07/16/2018   [place]insonism ([place] 01/05/2018   SIADH (syndrome of inappropriate ADH production) ([place] 10/01/2017   Spinal stenosis 10/01/2017   Elevated troponin I level 11/18/2016   COPD (chronic obstructive pulmonary disease) with chronic bronchitis ([place] 08/12/2016   Asthma without status asthmaticus 03/31/2016   Osteoporosis 03/31/2016   Fatty liver determined by biopsy 09/17/2015   Parotid swelling 09/13/2015   Urticaria 09/13/2015   History of CVA (cerebrovascular accident) 08/21/2015   Senile purpura ([place] 08/21/2015   Atrial tachycardia  ([place] 08/13/2015   Eczema 07/10/2015   Allergic rhinitis 06/26/2015   Anxiety 04/23/2015  Edema leg 03/27/2015   Generalized weakness 03/06/2015   Primary cardiomyopathy ([place]) 03/05/2015   CAD (coronary artery disease) 03/05/2015   Benign essential HTN 02/28/2015   Combined fat and carbohydrate induced hyperlipemia 09/06/2014   Acid reflux 08/01/2014      1. COPD (chronic obstructive pulmonary disease) with chronic bronchitis ([place]) This is been stable pulmonary function studies were reviewed and we discussed importance of compliance with recommended therapy she agrees that she needs to continue with the inhalers and will do so  2. Shortness of breath Due to multiple factors including the COPD.  Also of concern is whether history of Parkinson's whether she may also be having some microaspiration's.  She needs to be cautious with eating if symptoms worsen or she has frank aspiration will need evaluation with speech  3. Primary parkinsonism ([place]) As above at risk for aspiration and swallowing dysfunction we will continue to monitor closely.  4. Sciatica of left side Pain management referral was made today - Ambulatory referral to Pain Clinic  General Counseling: I have discussed the findings of the evaluation and examination with Lori Brady.  I have also discussed any further diagnostic evaluation thatmay be needed or ordered today. Lori Brady verbalizes understanding of the findings of todays visit. We also reviewed her medications today and discussed drug interactions and side effects including but not limited excessive drowsiness and altered mental states. We also discussed that there is always a risk not just to her but also people around her. she has been encouraged to call the office with any questions or concerns that should arise related to todays visit.  Orders Placed This Encounter  Procedures   Ambulatory referral to Pain Clinic    Referral Priority:   Urgent    Referral Type:    Consultation    Referral Reason:   Specialty Services Required    Requested Specialty:   Pain Medicine    Number of Visits Requested:   1     Time spent: 50  I have personally obtained a history, examined the patient, evaluated laboratory and imaging results, formulated the assessment and plan and placed orders.    Allyne Gee, MD [facility] Pulmonary and Critical Care Sleep medicine

## 2021-04-03 NOTE — Patient Instructions (Signed)
Chronic Obstructive Pulmonary Disease  Chronic obstructive pulmonary disease (COPD) is a long-term (chronic) lung problem. When you have COPD, it is hard for air to get in and out of your lungs. Usually the condition gets worse over time, and your lungs will never return to normal. There are things you can do to keep yourself as healthy as possible. What are the causes?  Smoking. This is the most common cause.  Certain genes passed from parent to child (inherited). What increases the risk?  Being exposed to secondhand smoke from cigarettes, pipes, or cigars.  Being exposed to chemicals and other irritants, such as fumes and dust in the work environment.  Having chronic lung conditions or infections. What are the signs or symptoms?  Shortness of breath, especially during physical activity.  A long-term cough with a large amount of thick mucus. Sometimes, the cough may not have any mucus (dry cough).  Wheezing.  Breathing quickly.  Skin that looks gray or blue, especially in the fingers, toes, or lips.  Feeling tired (fatigue).  Weight loss.  Chest tightness.  Having infections often.  Episodes when breathing symptoms become much worse (exacerbations). At the later stages of this disease, you may have swelling in the ankles, feet, or legs. How is this treated?  Taking medicines.  Quitting smoking, if you smoke.  Rehabilitation. This includes steps to make your body work better. It may involve a team of specialists.  Doing exercises.  Making changes to your diet.  Using oxygen.  Lung surgery.  Lung transplant.  Comfort measures (palliative care). Follow these instructions at home: Medicines  Take over-the-counter and prescription medicines only as told by your doctor.  Talk to your doctor before taking any cough or allergy medicines. You may need to avoid medicines that cause your lungs to be dry. Lifestyle  If you smoke, stop smoking. Smoking makes the  problem worse.  Do not smoke or use any products that contain nicotine or tobacco. If you need help quitting, ask your doctor.  Avoid being around things that make your breathing worse. This may include smoke, chemicals, and fumes.  Stay active, but remember to rest as well.  Learn and use tips on how to manage stress and control your breathing.  Make sure you get enough sleep. Most adults need at least 7 hours of sleep every night.  Eat healthy foods. Eat smaller meals more often. Rest before meals. Controlled breathing Learn and use tips on how to control your breathing as told by your doctor. Try:  Breathing in (inhaling) through your nose for 1 second. Then, pucker your lips and breath out (exhale) through your lips for 2 seconds.  Putting one hand on your belly (abdomen). Breathe in slowly through your nose for 1 second. Your hand on your belly should move out. Pucker your lips and breathe out slowly through your lips. Your hand on your belly should move in as you breathe out.   Controlled coughing Learn and use controlled coughing to clear mucus from your lungs. Follow these steps: 1. Lean your head a little forward. 2. Breathe in deeply. 3. Try to hold your breath for 3 seconds. 4. Keep your mouth slightly open while coughing 2 times. 5. Spit any mucus out into a tissue. 6. Rest and do the steps again 1 or 2 times as needed. General instructions  Make sure you get all the shots (vaccines) that your doctor recommends. Ask your doctor about a flu shot and a pneumonia shot.  Use oxygen therapy and pulmonary rehabilitation if told by your doctor. If you need home oxygen therapy, ask your doctor if you should buy a tool to measure your oxygen level (oximeter).  Make a COPD action plan with your doctor. This helps you to know what to do if you feel worse than usual.  Manage any other conditions you have as told by your doctor.  Avoid going outside when it is very hot, cold, or  humid.  Avoid people who have a sickness you can catch (contagious).  Keep all follow-up visits. Contact a doctor if:  You cough up more mucus than usual.  There is a change in the color or thickness of the mucus.  It is harder to breathe than usual.  Your breathing is faster than usual.  You have trouble sleeping.  You need to use your medicines more often than usual.  You have trouble doing your normal activities such as getting dressed or walking around the house. Get help right away if:  You have shortness of breath while resting.  You have shortness of breath that stops you from: ? Being able to talk. ? Doing normal activities.  Your chest hurts for longer than 5 minutes.  Your skin color is more blue than usual.  Your pulse oximeter shows that you have low oxygen for longer than 5 minutes.  You have a fever.  You feel too tired to breathe normally. These symptoms may represent a serious problem that is an emergency. Do not wait to see if the symptoms will go away. Get medical help right away. Call your local emergency services (911 in the [place] Do not drive yourself to the hospital. Summary  Chronic obstructive pulmonary disease (COPD) is a long-term lung problem.  The way your lungs work will never return to normal. Usually the condition gets worse over time. There are things you can do to keep yourself as healthy as possible.  Take over-the-counter and prescription medicines only as told by your doctor.  If you smoke, stop. Smoking makes the problem worse. This information is not intended to replace advice given to you by your health care provider. Make sure you discuss any questions you have with your health care provider. Document Revised: 09/10/2020 Document Reviewed: 09/10/2020 Elsevier Patient Education  2021 [place].   Sciatica  Sciatica is pain, weakness, tingling, or loss of feeling (numbness) along the sciatic nerve. The sciatic nerve  starts in the lower back and goes down the back of each leg. Sciatica usually goes away on its own or with treatment. Sometimes, sciatica may come back (recur). What are the causes? This condition happens when the sciatic nerve is pinched or has pressure put on it. This may be the result of:  A disk in between the bones of the spine bulging out too far (herniated disk).  Changes in the spinal disks that occur with aging.  A condition that affects a muscle in the butt.  Extra bone growth near the sciatic nerve.  A break (fracture) of the area between your hip bones (pelvis).  Pregnancy.  Tumor. This is rare. What increases the risk? You are more likely to develop this condition if you:  Play sports that put pressure or stress on the spine.  Have poor strength and ease of movement (flexibility).  Have had a back injury in the past.  Have had back surgery.  Sit for long periods of time.  Do activities that involve bending or lifting over  and over again.  Are very overweight (obese). What are the signs or symptoms? Symptoms can vary from mild to very bad. They may include:  Any of these problems in the lower back, leg, hip, or butt: ? Mild tingling, loss of feeling, or dull aches. ? Burning sensations. ? Sharp pains.  Loss of feeling in the back of the calf or the sole of the foot.  Leg weakness.  Very bad back pain that makes it hard to move. These symptoms may get worse when you cough, sneeze, or laugh. They may also get worse when you sit or stand for long periods of time. How is this treated? This condition often gets better without any treatment. However, treatment may include:  Changing or cutting back on physical activity when you have pain.  Doing exercises and stretching.  Putting ice or heat on the affected area.  Medicines that help: ? To relieve pain and swelling. ? To relax your muscles.  Shots (injections) of medicines that help to relieve pain,  irritation, and swelling.  Surgery. Follow these instructions at home: Medicines  Take over-the-counter and prescription medicines only as told by your doctor.  Ask your doctor if the medicine prescribed to you: ? Requires you to avoid driving or using heavy machinery. ? Can cause trouble pooping (constipation). You may need to take these steps to prevent or treat trouble pooping:  Drink enough fluids to keep your pee (urine) pale yellow.  Take over-the-counter or prescription medicines.  Eat foods that are high in fiber. These include beans, whole grains, and fresh fruits and vegetables.  Limit foods that are high in fat and sugar. These include fried or sweet foods. Managing pain  If told, put ice on the affected area. ? Put ice in a plastic bag. ? Place a towel between your skin and the bag. ? Leave the ice on for 20 minutes, 2-3 times a day.  If told, put heat on the affected area. Use the heat source that your doctor tells you to use, such as a moist heat pack or a heating pad. ? Place a towel between your skin and the heat source. ? Leave the heat on for 20-30 minutes. ? Remove the heat if your skin turns bright red. This is very important if you are unable to feel pain, heat, or cold. You may have a greater risk of getting burned.      Activity  Return to your normal activities as told by your doctor. Ask your doctor what activities are safe for you.  Avoid activities that make your symptoms worse.  Take short rests during the day. ? When you rest for a long time, do some physical activity or stretching between periods of rest. ? Avoid sitting for a long time without moving. Get up and move around at least one time each hour.  Exercise and stretch regularly, as told by your doctor.  Do not lift anything that is heavier than 10 lb (4.5 kg) while you have symptoms of sciatica. ? Avoid lifting heavy things even when you do not have symptoms. ? Avoid lifting heavy  things over and over.  When you lift objects, always lift in a way that is safe for your body. To do this, you should: ? Bend your knees. ? Keep the object close to your body. ? Avoid twisting.   General instructions  Stay at a healthy weight.  Wear comfortable shoes that support your feet. Avoid wearing high heels.  Avoid sleeping on a mattress that is too soft or too hard. You might have less pain if you sleep on a mattress that is firm enough to support your back.  Keep all follow-up visits as told by your doctor. This is important. Contact a doctor if:  You have pain that: ? Wakes you up when you are sleeping. ? Gets worse when you lie down. ? Is worse than the pain you have had in the past. ? Lasts longer than 4 weeks.  You lose weight without trying. Get help right away if:  You cannot control when you pee (urinate) or poop (have a bowel movement).  You have weakness in any of these areas and it gets worse: ? Lower back. ? The area between your hip bones. ? Butt. ? Legs.  You have redness or swelling of your back.  You have a burning feeling when you pee. Summary  Sciatica is pain, weakness, tingling, or loss of feeling (numbness) along the sciatic nerve.  This condition happens when the sciatic nerve is pinched or has pressure put on it.  Sciatica can cause pain, tingling, or loss of feeling (numbness) in the lower back, legs, hips, and butt.  Treatment often includes rest, exercise, medicines, and putting ice or heat on the affected area. This information is not intended to replace advice given to you by your health care provider. Make sure you discuss any questions you have with your health care provider. Document Revised: 11/21/2018 Document Reviewed: 11/21/2018 Elsevier Patient Education  [place].

## 2021-04-21 ENCOUNTER — Ambulatory Visit: Payer: Medicare PPO | Admitting: Pain Medicine

## 2021-04-23 DIAGNOSIS — M4317 Spondylolisthesis, lumbosacral region: Secondary | ICD-10-CM | POA: Diagnosis not present

## 2021-04-23 DIAGNOSIS — M4310 Spondylolisthesis, site unspecified: Secondary | ICD-10-CM | POA: Diagnosis not present

## 2021-04-23 DIAGNOSIS — M48062 Spinal stenosis, lumbar region with neurogenic claudication: Secondary | ICD-10-CM | POA: Diagnosis not present

## 2021-04-23 DIAGNOSIS — M4726 Other spondylosis with radiculopathy, lumbar region: Secondary | ICD-10-CM | POA: Diagnosis not present

## 2021-04-23 DIAGNOSIS — M160 Bilateral primary osteoarthritis of hip: Secondary | ICD-10-CM | POA: Diagnosis not present

## 2021-04-23 DIAGNOSIS — M419 Scoliosis, unspecified: Secondary | ICD-10-CM | POA: Diagnosis not present

## 2021-04-23 DIAGNOSIS — M5416 Radiculopathy, lumbar region: Secondary | ICD-10-CM | POA: Diagnosis not present

## 2021-04-24 DIAGNOSIS — M48062 Spinal stenosis, lumbar region with neurogenic claudication: Secondary | ICD-10-CM | POA: Diagnosis not present

## 2021-05-09 ENCOUNTER — Other Ambulatory Visit: Payer: Self-pay

## 2021-05-09 ENCOUNTER — Other Ambulatory Visit: Payer: Self-pay | Admitting: Family Medicine

## 2021-05-09 DIAGNOSIS — F419 Anxiety disorder, unspecified: Secondary | ICD-10-CM

## 2021-05-09 NOTE — Telephone Encounter (Signed)
[place] [facility] faxed refill request for the following medications:  potassium chloride SA (KLOR-CON) 20 MEQ tablet  Please advise. Thanks TNP

## 2021-05-12 MED ORDER — POTASSIUM CHLORIDE CRYS ER 20 MEQ PO TBCR: 20.0000 meq | EXTENDED_RELEASE_TABLET | Freq: Every day | ORAL | 1 refills | Status: DC

## 2021-05-12 NOTE — Telephone Encounter (Signed)
LOV 01/28/21 NOV 02/02/22 LRF 12/33/21  90 x 1 Last K+ 4.3 on 01/28/21

## 2021-05-26 DIAGNOSIS — M5416 Radiculopathy, lumbar region: Secondary | ICD-10-CM | POA: Diagnosis not present

## 2021-05-26 DIAGNOSIS — M5136 Other intervertebral disc degeneration, lumbar region: Secondary | ICD-10-CM | POA: Diagnosis not present

## 2021-06-03 ENCOUNTER — Telehealth: Payer: Self-pay

## 2021-06-03 DIAGNOSIS — Z1211 Encounter for screening for malignant neoplasm of colon: Secondary | ICD-10-CM

## 2021-06-03 NOTE — Telephone Encounter (Signed)
Copied from [place] [id number]. Topic: Referral - Request for Referral >> Jun 03, 2021 11:24 AM Oneta Rack wrote: Caller name: Luvenia Starch  Relation to pt: from [facility]  Call back number: [phone]    Reason for call: Requesting a referral to colonoscopy, Monday Tuesday and Wednesday preferable and no preference regarding location. Caller would like you to follow up with patient regarding status of referral request

## 2021-06-04 ENCOUNTER — Other Ambulatory Visit: Payer: Self-pay

## 2021-06-04 DIAGNOSIS — J449 Chronic obstructive pulmonary disease, unspecified: Secondary | ICD-10-CM

## 2021-06-04 MED ORDER — ALBUTEROL SULFATE HFA 108 (90 BASE) MCG/ACT IN AERS: 2.0000 | INHALATION_SPRAY | Freq: Four times a day (QID) | RESPIRATORY_TRACT | 3 refills | Status: DC | PRN

## 2021-06-05 NOTE — Addendum Note (Signed)
Addended by: Minette Headland on: 06/05/2021 09:16 AM   Modules accepted: Orders

## 2021-06-05 NOTE — Telephone Encounter (Signed)
Ok to place gi referral

## 2021-06-05 NOTE — Telephone Encounter (Signed)
Please advise 

## 2021-06-09 ENCOUNTER — Encounter: Payer: Self-pay | Admitting: *Deleted

## 2021-06-09 DIAGNOSIS — I429 Cardiomyopathy, unspecified: Secondary | ICD-10-CM | POA: Diagnosis not present

## 2021-06-09 DIAGNOSIS — I10 Essential (primary) hypertension: Secondary | ICD-10-CM | POA: Diagnosis not present

## 2021-06-09 DIAGNOSIS — E782 Mixed hyperlipidemia: Secondary | ICD-10-CM | POA: Diagnosis not present

## 2021-06-09 DIAGNOSIS — I2510 Atherosclerotic heart disease of native coronary artery without angina pectoris: Secondary | ICD-10-CM | POA: Diagnosis not present

## 2021-06-17 DIAGNOSIS — Z8673 Personal history of transient ischemic attack (TIA), and cerebral infarction without residual deficits: Secondary | ICD-10-CM | POA: Diagnosis not present

## 2021-06-17 DIAGNOSIS — G20 Parkinson's disease: Secondary | ICD-10-CM | POA: Diagnosis not present

## 2021-06-17 DIAGNOSIS — F419 Anxiety disorder, unspecified: Secondary | ICD-10-CM | POA: Diagnosis not present

## 2021-06-17 DIAGNOSIS — R2689 Other abnormalities of gait and mobility: Secondary | ICD-10-CM | POA: Diagnosis not present

## 2021-06-23 ENCOUNTER — Other Ambulatory Visit: Payer: Self-pay | Admitting: Internal Medicine

## 2021-06-23 ENCOUNTER — Other Ambulatory Visit: Payer: Self-pay | Admitting: Family Medicine

## 2021-06-23 DIAGNOSIS — J301 Allergic rhinitis due to pollen: Secondary | ICD-10-CM

## 2021-06-23 NOTE — Telephone Encounter (Signed)
Requested Prescriptions  Pending Prescriptions Disp Refills  . doxepin (SINEQUAN) 25 MG capsule [Pharmacy Med Name: DOXEPIN HCL 25 MG CAP] 90 capsule 0    Sig: TAKE 1 CAPSULE BY MOUTH AT BEDTIME     Psychiatry:  Antidepressants - Heterocyclics (TCAs) Passed - 06/23/2021 12:05 PM      Passed - Valid encounter within last 6 months    Recent Outpatient Visits          4 months ago Encounter for annual physical exam   [facility] [place], Dionne Bucy, MD   11 months ago Benign essential HTN   [facility] [place], Dionne Bucy, MD   1 year ago Encounter for annual physical exam   [facility], Dionne Bucy, MD   1 year ago Benign essential HTN   [facility], Dionne Bucy, MD   2 years ago Benign essential HTN   [facility] [place], Dionne Bucy, MD      Future Appointments            In 7 months Bacigalupo, Dionne Bucy, MD [facility]           . meloxicam (MOBIC) 7.5 MG tablet [Pharmacy Med Name: MELOXICAM 7.5 MG TAB] 30 tablet     Sig: TAKE 1 TABLET BY MOUTH ONCE DAILY     Analgesics:  COX2 Inhibitors Failed - 06/23/2021 12:05 PM      Failed - HGB in normal range and within 360 days    Hemoglobin  Date Value Ref Range Status  01/28/2021 10.1 (L) 11.1 - 15.9 g/dL Final         Passed - Cr in normal range and within 360 days    Creatinine  Date Value Ref Range Status  03/10/2015 0.79 mg/dL Final    Comment:    0.44-1.00 NOTE: New Reference Range  01/22/15    Creatinine, Ser  Date Value Ref Range Status  01/28/2021 0.79 0.57 - 1.00 mg/dL Final         Passed - Patient is not pregnant      Passed - Valid encounter within last 12 months    Recent Outpatient Visits          4 months ago Encounter for annual physical exam   [facility] [place], Dionne Bucy, MD   11 months ago Benign essential HTN   [facility] [place], Dionne Bucy, MD    1 year ago Encounter for annual physical exam   [facility] [place], Dionne Bucy, MD   1 year ago Benign essential HTN   [place], Dionne Bucy, MD   2 years ago Benign essential HTN   [place], Dionne Bucy, MD      Future Appointments            In 7 months Bacigalupo, Dionne Bucy, MD [facility], [place]

## 2021-06-23 NOTE — Telephone Encounter (Signed)
Requested medication (s) are due for refill today: yes  Requested medication (s) are on the active medication list: yes  Last refill:  03/21/21 #30   Future visit scheduled: yes  Notes to clinic:  last Hgb 10 on 01/28/21   Requested Prescriptions  Pending Prescriptions Disp Refills   meloxicam (MOBIC) 7.5 MG tablet [Pharmacy Med Name: MELOXICAM 7.5 MG TAB] 30 tablet     Sig: TAKE 1 TABLET BY MOUTH ONCE DAILY      Analgesics:  COX2 Inhibitors Failed - 06/23/2021 12:05 PM      Failed - HGB in normal range and within 360 days    Hemoglobin  Date Value Ref Range Status  01/28/2021 10.1 (L) 11.1 - 15.9 g/dL Final          Passed - Cr in normal range and within 360 days    Creatinine  Date Value Ref Range Status  03/10/2015 0.79 mg/dL Final    Comment:    0.44-1.00 NOTE: New Reference Range  01/22/15    Creatinine, Ser  Date Value Ref Range Status  01/28/2021 0.79 0.57 - 1.00 mg/dL Final          Passed - Patient is not pregnant      Passed - Valid encounter within last 12 months    Recent Outpatient Visits           4 months ago Encounter for annual physical exam   [facility], Dionne Bucy, MD   11 months ago Benign essential HTN   [facility] [place], Dionne Bucy, MD   1 year ago Encounter for annual physical exam   [facility] [place], Dionne Bucy, MD   1 year ago Benign essential HTN   [facility] [place], Dionne Bucy, MD   2 years ago Benign essential HTN   [place]igal[place], Dionne Bucy, MD       Future Appointments             In 7 months Bacigalupo, Dionne Bucy, MD [facility]              Signed Prescriptions Disp Refills   doxepin (SINEQUAN) 25 MG capsule 90 capsule 0    Sig: TAKE 1 CAPSULE BY MOUTH AT BEDTIME      Psychiatry:  Antidepressants - Heterocyclics (TCAs) Passed - 06/23/2021 12:05 PM      Passed - Valid encounter within last 6  months    Recent Outpatient Visits           4 months ago Encounter for annual physical exam   [facility] [place], Dionne Bucy, MD   11 months ago Benign essential HTN   [facility] [place], Dionne Bucy, MD   1 year ago Encounter for annual physical exam   [facility] [place], Dionne Bucy, MD   1 year ago Benign essential HTN   [place], Dionne Bucy, MD   2 years ago Benign essential HTN   [place], Dionne Bucy, MD       Future Appointments             In 7 months Bacigalupo, Dionne Bucy, MD [facility], [place]

## 2021-07-11 DIAGNOSIS — H9190 Unspecified hearing loss, unspecified ear: Secondary | ICD-10-CM | POA: Diagnosis not present

## 2021-07-11 DIAGNOSIS — G20 Parkinson's disease: Secondary | ICD-10-CM | POA: Diagnosis not present

## 2021-07-11 DIAGNOSIS — M1990 Unspecified osteoarthritis, unspecified site: Secondary | ICD-10-CM | POA: Diagnosis not present

## 2021-07-11 DIAGNOSIS — I2510 Atherosclerotic heart disease of native coronary artery without angina pectoris: Secondary | ICD-10-CM | POA: Diagnosis not present

## 2021-07-11 DIAGNOSIS — J45909 Unspecified asthma, uncomplicated: Secondary | ICD-10-CM | POA: Diagnosis not present

## 2021-07-11 DIAGNOSIS — I429 Cardiomyopathy, unspecified: Secondary | ICD-10-CM | POA: Diagnosis not present

## 2021-07-11 DIAGNOSIS — I10 Essential (primary) hypertension: Secondary | ICD-10-CM | POA: Diagnosis not present

## 2021-07-11 DIAGNOSIS — J439 Emphysema, unspecified: Secondary | ICD-10-CM | POA: Diagnosis not present

## 2021-07-11 DIAGNOSIS — K5090 Crohn's disease, unspecified, without complications: Secondary | ICD-10-CM | POA: Diagnosis not present

## 2021-07-14 DIAGNOSIS — I10 Essential (primary) hypertension: Secondary | ICD-10-CM | POA: Diagnosis not present

## 2021-07-14 DIAGNOSIS — G20 Parkinson's disease: Secondary | ICD-10-CM | POA: Diagnosis not present

## 2021-07-14 DIAGNOSIS — M1990 Unspecified osteoarthritis, unspecified site: Secondary | ICD-10-CM | POA: Diagnosis not present

## 2021-07-14 DIAGNOSIS — J439 Emphysema, unspecified: Secondary | ICD-10-CM | POA: Diagnosis not present

## 2021-07-14 DIAGNOSIS — I429 Cardiomyopathy, unspecified: Secondary | ICD-10-CM | POA: Diagnosis not present

## 2021-07-14 DIAGNOSIS — K5090 Crohn's disease, unspecified, without complications: Secondary | ICD-10-CM | POA: Diagnosis not present

## 2021-07-14 DIAGNOSIS — H9190 Unspecified hearing loss, unspecified ear: Secondary | ICD-10-CM | POA: Diagnosis not present

## 2021-07-14 DIAGNOSIS — J45909 Unspecified asthma, uncomplicated: Secondary | ICD-10-CM | POA: Diagnosis not present

## 2021-07-14 DIAGNOSIS — I2510 Atherosclerotic heart disease of native coronary artery without angina pectoris: Secondary | ICD-10-CM | POA: Diagnosis not present

## 2021-07-16 DIAGNOSIS — H9190 Unspecified hearing loss, unspecified ear: Secondary | ICD-10-CM | POA: Diagnosis not present

## 2021-07-16 DIAGNOSIS — I429 Cardiomyopathy, unspecified: Secondary | ICD-10-CM | POA: Diagnosis not present

## 2021-07-16 DIAGNOSIS — G20 Parkinson's disease: Secondary | ICD-10-CM | POA: Diagnosis not present

## 2021-07-16 DIAGNOSIS — K5090 Crohn's disease, unspecified, without complications: Secondary | ICD-10-CM | POA: Diagnosis not present

## 2021-07-16 DIAGNOSIS — J45909 Unspecified asthma, uncomplicated: Secondary | ICD-10-CM | POA: Diagnosis not present

## 2021-07-16 DIAGNOSIS — J439 Emphysema, unspecified: Secondary | ICD-10-CM | POA: Diagnosis not present

## 2021-07-16 DIAGNOSIS — I2510 Atherosclerotic heart disease of native coronary artery without angina pectoris: Secondary | ICD-10-CM | POA: Diagnosis not present

## 2021-07-16 DIAGNOSIS — M1990 Unspecified osteoarthritis, unspecified site: Secondary | ICD-10-CM | POA: Diagnosis not present

## 2021-07-16 DIAGNOSIS — I10 Essential (primary) hypertension: Secondary | ICD-10-CM | POA: Diagnosis not present

## 2021-07-21 DIAGNOSIS — K5090 Crohn's disease, unspecified, without complications: Secondary | ICD-10-CM | POA: Diagnosis not present

## 2021-07-21 DIAGNOSIS — H9190 Unspecified hearing loss, unspecified ear: Secondary | ICD-10-CM | POA: Diagnosis not present

## 2021-07-21 DIAGNOSIS — I10 Essential (primary) hypertension: Secondary | ICD-10-CM | POA: Diagnosis not present

## 2021-07-21 DIAGNOSIS — I429 Cardiomyopathy, unspecified: Secondary | ICD-10-CM | POA: Diagnosis not present

## 2021-07-21 DIAGNOSIS — I2510 Atherosclerotic heart disease of native coronary artery without angina pectoris: Secondary | ICD-10-CM | POA: Diagnosis not present

## 2021-07-21 DIAGNOSIS — G20 Parkinson's disease: Secondary | ICD-10-CM | POA: Diagnosis not present

## 2021-07-21 DIAGNOSIS — J45909 Unspecified asthma, uncomplicated: Secondary | ICD-10-CM | POA: Diagnosis not present

## 2021-07-21 DIAGNOSIS — M1990 Unspecified osteoarthritis, unspecified site: Secondary | ICD-10-CM | POA: Diagnosis not present

## 2021-07-21 DIAGNOSIS — J439 Emphysema, unspecified: Secondary | ICD-10-CM | POA: Diagnosis not present

## 2021-07-23 DIAGNOSIS — K5090 Crohn's disease, unspecified, without complications: Secondary | ICD-10-CM | POA: Diagnosis not present

## 2021-07-23 DIAGNOSIS — I429 Cardiomyopathy, unspecified: Secondary | ICD-10-CM | POA: Diagnosis not present

## 2021-07-23 DIAGNOSIS — M1990 Unspecified osteoarthritis, unspecified site: Secondary | ICD-10-CM | POA: Diagnosis not present

## 2021-07-23 DIAGNOSIS — I2510 Atherosclerotic heart disease of native coronary artery without angina pectoris: Secondary | ICD-10-CM | POA: Diagnosis not present

## 2021-07-23 DIAGNOSIS — H9190 Unspecified hearing loss, unspecified ear: Secondary | ICD-10-CM | POA: Diagnosis not present

## 2021-07-23 DIAGNOSIS — G20 Parkinson's disease: Secondary | ICD-10-CM | POA: Diagnosis not present

## 2021-07-23 DIAGNOSIS — J45909 Unspecified asthma, uncomplicated: Secondary | ICD-10-CM | POA: Diagnosis not present

## 2021-07-23 DIAGNOSIS — J439 Emphysema, unspecified: Secondary | ICD-10-CM | POA: Diagnosis not present

## 2021-07-23 DIAGNOSIS — I10 Essential (primary) hypertension: Secondary | ICD-10-CM | POA: Diagnosis not present

## 2021-07-28 DIAGNOSIS — H9190 Unspecified hearing loss, unspecified ear: Secondary | ICD-10-CM | POA: Diagnosis not present

## 2021-07-28 DIAGNOSIS — G20 Parkinson's disease: Secondary | ICD-10-CM | POA: Diagnosis not present

## 2021-07-28 DIAGNOSIS — K5090 Crohn's disease, unspecified, without complications: Secondary | ICD-10-CM | POA: Diagnosis not present

## 2021-07-28 DIAGNOSIS — J45909 Unspecified asthma, uncomplicated: Secondary | ICD-10-CM | POA: Diagnosis not present

## 2021-07-28 DIAGNOSIS — I429 Cardiomyopathy, unspecified: Secondary | ICD-10-CM | POA: Diagnosis not present

## 2021-07-28 DIAGNOSIS — M1990 Unspecified osteoarthritis, unspecified site: Secondary | ICD-10-CM | POA: Diagnosis not present

## 2021-07-28 DIAGNOSIS — I10 Essential (primary) hypertension: Secondary | ICD-10-CM | POA: Diagnosis not present

## 2021-07-28 DIAGNOSIS — I2510 Atherosclerotic heart disease of native coronary artery without angina pectoris: Secondary | ICD-10-CM | POA: Diagnosis not present

## 2021-07-28 DIAGNOSIS — J439 Emphysema, unspecified: Secondary | ICD-10-CM | POA: Diagnosis not present

## 2021-07-30 DIAGNOSIS — H9190 Unspecified hearing loss, unspecified ear: Secondary | ICD-10-CM | POA: Diagnosis not present

## 2021-07-30 DIAGNOSIS — I10 Essential (primary) hypertension: Secondary | ICD-10-CM | POA: Diagnosis not present

## 2021-07-30 DIAGNOSIS — J439 Emphysema, unspecified: Secondary | ICD-10-CM | POA: Diagnosis not present

## 2021-07-30 DIAGNOSIS — I429 Cardiomyopathy, unspecified: Secondary | ICD-10-CM | POA: Diagnosis not present

## 2021-07-30 DIAGNOSIS — M1990 Unspecified osteoarthritis, unspecified site: Secondary | ICD-10-CM | POA: Diagnosis not present

## 2021-07-30 DIAGNOSIS — G20 Parkinson's disease: Secondary | ICD-10-CM | POA: Diagnosis not present

## 2021-07-30 DIAGNOSIS — J45909 Unspecified asthma, uncomplicated: Secondary | ICD-10-CM | POA: Diagnosis not present

## 2021-07-30 DIAGNOSIS — K5090 Crohn's disease, unspecified, without complications: Secondary | ICD-10-CM | POA: Diagnosis not present

## 2021-07-30 DIAGNOSIS — I2510 Atherosclerotic heart disease of native coronary artery without angina pectoris: Secondary | ICD-10-CM | POA: Diagnosis not present

## 2021-08-04 DIAGNOSIS — I2510 Atherosclerotic heart disease of native coronary artery without angina pectoris: Secondary | ICD-10-CM | POA: Diagnosis not present

## 2021-08-04 DIAGNOSIS — I10 Essential (primary) hypertension: Secondary | ICD-10-CM | POA: Diagnosis not present

## 2021-08-04 DIAGNOSIS — J439 Emphysema, unspecified: Secondary | ICD-10-CM | POA: Diagnosis not present

## 2021-08-04 DIAGNOSIS — M1990 Unspecified osteoarthritis, unspecified site: Secondary | ICD-10-CM | POA: Diagnosis not present

## 2021-08-04 DIAGNOSIS — J45909 Unspecified asthma, uncomplicated: Secondary | ICD-10-CM | POA: Diagnosis not present

## 2021-08-04 DIAGNOSIS — I429 Cardiomyopathy, unspecified: Secondary | ICD-10-CM | POA: Diagnosis not present

## 2021-08-04 DIAGNOSIS — K5090 Crohn's disease, unspecified, without complications: Secondary | ICD-10-CM | POA: Diagnosis not present

## 2021-08-04 DIAGNOSIS — H9190 Unspecified hearing loss, unspecified ear: Secondary | ICD-10-CM | POA: Diagnosis not present

## 2021-08-04 DIAGNOSIS — G20 Parkinson's disease: Secondary | ICD-10-CM | POA: Diagnosis not present

## 2021-08-05 DIAGNOSIS — K5090 Crohn's disease, unspecified, without complications: Secondary | ICD-10-CM | POA: Diagnosis not present

## 2021-08-05 DIAGNOSIS — I429 Cardiomyopathy, unspecified: Secondary | ICD-10-CM | POA: Diagnosis not present

## 2021-08-05 DIAGNOSIS — M1990 Unspecified osteoarthritis, unspecified site: Secondary | ICD-10-CM | POA: Diagnosis not present

## 2021-08-05 DIAGNOSIS — I2510 Atherosclerotic heart disease of native coronary artery without angina pectoris: Secondary | ICD-10-CM | POA: Diagnosis not present

## 2021-08-05 DIAGNOSIS — J45909 Unspecified asthma, uncomplicated: Secondary | ICD-10-CM | POA: Diagnosis not present

## 2021-08-05 DIAGNOSIS — J439 Emphysema, unspecified: Secondary | ICD-10-CM | POA: Diagnosis not present

## 2021-08-05 DIAGNOSIS — G20 Parkinson's disease: Secondary | ICD-10-CM | POA: Diagnosis not present

## 2021-08-05 DIAGNOSIS — H9190 Unspecified hearing loss, unspecified ear: Secondary | ICD-10-CM | POA: Diagnosis not present

## 2021-08-05 DIAGNOSIS — I10 Essential (primary) hypertension: Secondary | ICD-10-CM | POA: Diagnosis not present

## 2021-08-08 ENCOUNTER — Telehealth: Payer: Self-pay | Admitting: Family Medicine

## 2021-08-08 NOTE — Telephone Encounter (Signed)
Home Health Verbal Orders - Caller/Agency: -[place] Number: [phone]  Requesting OT/PT/Skilled Nursing/Social Work/Speech Therapy: extension of Speech Therapy  Frequency: 1w1

## 2021-08-11 DIAGNOSIS — K5090 Crohn's disease, unspecified, without complications: Secondary | ICD-10-CM | POA: Diagnosis not present

## 2021-08-11 DIAGNOSIS — I10 Essential (primary) hypertension: Secondary | ICD-10-CM | POA: Diagnosis not present

## 2021-08-11 DIAGNOSIS — H9190 Unspecified hearing loss, unspecified ear: Secondary | ICD-10-CM | POA: Diagnosis not present

## 2021-08-11 DIAGNOSIS — J439 Emphysema, unspecified: Secondary | ICD-10-CM | POA: Diagnosis not present

## 2021-08-11 DIAGNOSIS — I429 Cardiomyopathy, unspecified: Secondary | ICD-10-CM | POA: Diagnosis not present

## 2021-08-11 DIAGNOSIS — I2510 Atherosclerotic heart disease of native coronary artery without angina pectoris: Secondary | ICD-10-CM | POA: Diagnosis not present

## 2021-08-11 DIAGNOSIS — M1990 Unspecified osteoarthritis, unspecified site: Secondary | ICD-10-CM | POA: Diagnosis not present

## 2021-08-11 DIAGNOSIS — J45909 Unspecified asthma, uncomplicated: Secondary | ICD-10-CM | POA: Diagnosis not present

## 2021-08-11 DIAGNOSIS — G20 Parkinson's disease: Secondary | ICD-10-CM | POA: Diagnosis not present

## 2021-08-11 NOTE — Telephone Encounter (Signed)
OK for verbals

## 2021-08-13 DIAGNOSIS — J45909 Unspecified asthma, uncomplicated: Secondary | ICD-10-CM | POA: Diagnosis not present

## 2021-08-13 DIAGNOSIS — G20 Parkinson's disease: Secondary | ICD-10-CM | POA: Diagnosis not present

## 2021-08-13 DIAGNOSIS — K5090 Crohn's disease, unspecified, without complications: Secondary | ICD-10-CM | POA: Diagnosis not present

## 2021-08-13 DIAGNOSIS — I2510 Atherosclerotic heart disease of native coronary artery without angina pectoris: Secondary | ICD-10-CM | POA: Diagnosis not present

## 2021-08-13 DIAGNOSIS — I10 Essential (primary) hypertension: Secondary | ICD-10-CM | POA: Diagnosis not present

## 2021-08-13 DIAGNOSIS — J439 Emphysema, unspecified: Secondary | ICD-10-CM | POA: Diagnosis not present

## 2021-08-13 DIAGNOSIS — H9190 Unspecified hearing loss, unspecified ear: Secondary | ICD-10-CM | POA: Diagnosis not present

## 2021-08-13 DIAGNOSIS — M1990 Unspecified osteoarthritis, unspecified site: Secondary | ICD-10-CM | POA: Diagnosis not present

## 2021-08-13 DIAGNOSIS — I429 Cardiomyopathy, unspecified: Secondary | ICD-10-CM | POA: Diagnosis not present

## 2021-08-14 ENCOUNTER — Other Ambulatory Visit: Payer: Self-pay | Admitting: Family Medicine

## 2021-08-14 DIAGNOSIS — F419 Anxiety disorder, unspecified: Secondary | ICD-10-CM

## 2021-08-14 NOTE — Telephone Encounter (Signed)
Future OV 02/02/22 Approved per protocol.

## 2021-08-18 DIAGNOSIS — H53002 Unspecified amblyopia, left eye: Secondary | ICD-10-CM | POA: Diagnosis not present

## 2021-08-18 DIAGNOSIS — Z961 Presence of intraocular lens: Secondary | ICD-10-CM | POA: Diagnosis not present

## 2021-08-18 DIAGNOSIS — H5202 Hypermetropia, left eye: Secondary | ICD-10-CM | POA: Diagnosis not present

## 2021-08-18 DIAGNOSIS — H5211 Myopia, right eye: Secondary | ICD-10-CM | POA: Diagnosis not present

## 2021-08-19 DIAGNOSIS — I2510 Atherosclerotic heart disease of native coronary artery without angina pectoris: Secondary | ICD-10-CM | POA: Diagnosis not present

## 2021-08-19 DIAGNOSIS — J439 Emphysema, unspecified: Secondary | ICD-10-CM | POA: Diagnosis not present

## 2021-08-19 DIAGNOSIS — G20 Parkinson's disease: Secondary | ICD-10-CM | POA: Diagnosis not present

## 2021-08-19 DIAGNOSIS — K5090 Crohn's disease, unspecified, without complications: Secondary | ICD-10-CM | POA: Diagnosis not present

## 2021-08-19 DIAGNOSIS — J45909 Unspecified asthma, uncomplicated: Secondary | ICD-10-CM | POA: Diagnosis not present

## 2021-08-19 DIAGNOSIS — I429 Cardiomyopathy, unspecified: Secondary | ICD-10-CM | POA: Diagnosis not present

## 2021-08-19 DIAGNOSIS — I10 Essential (primary) hypertension: Secondary | ICD-10-CM | POA: Diagnosis not present

## 2021-08-19 DIAGNOSIS — M1990 Unspecified osteoarthritis, unspecified site: Secondary | ICD-10-CM | POA: Diagnosis not present

## 2021-08-19 DIAGNOSIS — H9190 Unspecified hearing loss, unspecified ear: Secondary | ICD-10-CM | POA: Diagnosis not present

## 2021-09-08 ENCOUNTER — Telehealth: Payer: Self-pay

## 2021-09-08 NOTE — Telephone Encounter (Signed)
Copied from [place] [phone]. Topic: General - Other >> Sep 08, 2021  1:46 PM Leward Quan A wrote: Reason for CRM: Kristen with [facility] [employer] called in asking if form that was previously faxed in can be refaxed. Order was faxed on on 07/29/21 please fax to fax# [phone]  Any question please call Kristen at [phone]

## 2021-09-09 ENCOUNTER — Other Ambulatory Visit: Payer: Self-pay | Admitting: Internal Medicine

## 2021-09-09 DIAGNOSIS — J301 Allergic rhinitis due to pollen: Secondary | ICD-10-CM

## 2021-09-09 NOTE — Telephone Encounter (Signed)
Faxed forms from patient media tab. Attempted call NA.

## 2021-09-10 ENCOUNTER — Other Ambulatory Visit: Payer: Self-pay

## 2021-09-10 ENCOUNTER — Ambulatory Visit (INDEPENDENT_AMBULATORY_CARE_PROVIDER_SITE_OTHER): Payer: Medicare PPO

## 2021-09-10 DIAGNOSIS — E7800 Pure hypercholesterolemia, unspecified: Secondary | ICD-10-CM

## 2021-09-10 DIAGNOSIS — Z23 Encounter for immunization: Secondary | ICD-10-CM

## 2021-09-10 MED ORDER — ATORVASTATIN CALCIUM 40 MG PO TABS: 40.0000 mg | ORAL_TABLET | Freq: Every day | ORAL | 0 refills | Status: DC

## 2021-09-10 MED ORDER — OMEPRAZOLE 40 MG PO CPDR: 40.0000 mg | DELAYED_RELEASE_CAPSULE | Freq: Every day | ORAL | 0 refills | Status: DC

## 2021-09-23 ENCOUNTER — Other Ambulatory Visit: Payer: Self-pay | Admitting: Family Medicine

## 2021-09-23 NOTE — Telephone Encounter (Signed)
Requested Prescriptions  Pending Prescriptions Disp Refills  . doxepin (SINEQUAN) 25 MG capsule [Pharmacy Med Name: DOXEPIN HCL 25 MG CAP] 90 capsule 0    Sig: TAKE 1 CAPSULE BY MOUTH AT BEDTIME     Psychiatry:  Antidepressants - Heterocyclics (TCAs) Failed - 09/23/2021 12:38 PM      Failed - Valid encounter within last 6 months    Recent Outpatient Visits          7 months ago Encounter for annual physical exam   [facility] [place], Dionne Bucy, MD   1 year ago Benign essential HTN   [facility] [place], Dionne Bucy, MD   1 year ago Encounter for annual physical exam   [facility] [place], Dionne Bucy, MD   2 years ago Benign essential HTN   [facility], Dionne Bucy, MD   2 years ago Benign essential HTN   [place], Dionne Bucy, MD      Future Appointments            In 2 months Bacigalupo, Dionne Bucy, MD [facility], [place]   In 4 months Bacigalupo, Dionne Bucy, MD [facility], [place]

## 2021-09-29 ENCOUNTER — Encounter: Payer: Self-pay | Admitting: Internal Medicine

## 2021-09-29 ENCOUNTER — Other Ambulatory Visit: Payer: Self-pay

## 2021-09-29 ENCOUNTER — Encounter (INDEPENDENT_AMBULATORY_CARE_PROVIDER_SITE_OTHER): Payer: Self-pay

## 2021-09-29 ENCOUNTER — Ambulatory Visit: Payer: Medicare PPO | Admitting: Internal Medicine

## 2021-09-29 VITALS — BP 126/80 | HR 91 | Temp 98.1°F | Resp 16 | Ht 61.0 in | Wt 142.0 lb

## 2021-09-29 DIAGNOSIS — J449 Chronic obstructive pulmonary disease, unspecified: Secondary | ICD-10-CM | POA: Diagnosis not present

## 2021-09-29 DIAGNOSIS — K219 Gastro-esophageal reflux disease without esophagitis: Secondary | ICD-10-CM | POA: Diagnosis not present

## 2021-09-29 DIAGNOSIS — R0602 Shortness of breath: Secondary | ICD-10-CM | POA: Diagnosis not present

## 2021-09-29 DIAGNOSIS — G20 Parkinson's disease: Secondary | ICD-10-CM | POA: Diagnosis not present

## 2021-09-29 NOTE — Patient Instructions (Signed)

## 2021-09-29 NOTE — Progress Notes (Signed)
[facility] [place], [place] [address]  Pulmonary Sleep Medicine   Office Visit Note  Patient Name: Lori Brady DOB: January 25, 1946 MRN [id number]  Date of Service: 09/29/2021  Complaints/HPI: COPD on breo and spiriva. Arlyce Harman looks better today. Parkinsons disease is being followed by Dr Manuella Ghazi. She has been having issues with dizziness and also falls. She remains at risk for aspiration and also for falls.  Patient's breathing has been doing fine on the [place] and she also states that she takes the Spiriva.  In addition she does have rescue with albuterol.  She is taking carbidopa levodopa for her Parkinson's  ROS  General: (-) fever, (-) chills, (-) night sweats, (-) weakness Skin: (-) rashes, (-) itching,. Eyes: (-) visual changes, (-) redness, (-) itching. Nose and Sinuses: (-) nasal stuffiness or itchiness, (-) postnasal drip, (-) nosebleeds, (-) sinus trouble. Mouth and Throat: (-) sore throat, (-) hoarseness. Neck: (-) swollen glands, (-) enlarged thyroid, (-) neck pain. Respiratory: -- cough, (-) bloody sputum, - shortness of breath, - wheezing. Cardiovascular: - ankle swelling, (-) chest pain. Lymphatic: (-) lymph node enlargement. Neurologic: (-) numbness, (-) tingling. Psychiatric: (-) anxiety, (-) depression   Current Medication: Outpatient Encounter Medications as of 09/29/2021  Medication Sig Note   acetaminophen (TYLENOL) 500 MG tablet Take 500 mg by mouth every 6 (six) hours as needed.     albuterol (ACCUNEB) 0.63 MG/3ML nebulizer solution Take 1 ampule by nebulization every 6 (six) hours as needed for wheezing.    albuterol (VENTOLIN HFA) 108 (90 Base) MCG/ACT inhaler Inhale 2 puffs into the lungs every 6 (six) hours as needed for wheezing or shortness of breath.    alendronate (FOSAMAX) 70 MG tablet One tab every Sunday on empty stomach with a full glass of water. Do not lie down or eat/drink anything else for the next 30 min.    aspirin 81 MG  tablet Take 81 mg by mouth daily.    atorvastatin (LIPITOR) 40 MG tablet Take 1 tablet (40 mg total) by mouth at bedtime.    azelastine (ASTELIN) 0.1 % nasal spray Place 2 sprays into both nostrils daily at 6 (six) AM.    betamethasone valerate (VALISONE) 0.1 % cream APPLY TOPICALLY TO AFFECTED AREA SPARINGLY 2 TIMES DAILY. 01/23/2021: As needed   BREO ELLIPTA 100-25 MCG/INH AEPB INHALE 1 PUFF BY MOUTH ONCE DAILY AS DIRECTED    carbidopa-levodopa (SINEMET CR) 50-200 MG tablet Take 1 tablet by mouth daily. Before dose    cetirizine (ZYRTEC) 10 MG tablet TAKE 1 TABLET BY MOUTH ONCE DAILY    Cholecalciferol (VITAMIN D) 2000 UNITS CAPS Take 1 capsule by mouth daily.    citalopram (CELEXA) 20 MG tablet TAKE 1 TABLET BY MOUTH ONCE DAILY    clobetasol (TEMOVATE) 0.05 % external solution Apply 1 application topically 2 (two) times daily. As needed    clobetasol cream (TEMOVATE) 6.72 % Apply 1 application topically 2 (two) times daily. As needed    doxepin (SINEQUAN) 25 MG capsule TAKE 1 CAPSULE BY MOUTH AT BEDTIME    fluticasone (FLONASE) 50 MCG/ACT nasal spray PLACE 2 SPRAYS INTO BOTHS NOSTRILS DAILY    ketoconazole (NIZORAL) 2 % shampoo Apply 1 application topically 2 (two) times a week.    meloxicam (MOBIC) 7.5 MG tablet TAKE 1 TABLET BY MOUTH ONCE DAILY    montelukast (SINGULAIR) 10 MG tablet TAKE 1 TABLET BY MOUTH ONCE DAILY    omeprazole (PRILOSEC) 40 MG capsule Take 1 capsule (40  mg total) by mouth daily.    ondansetron (ZOFRAN ODT) 4 MG disintegrating tablet Take 1 tablet (4 mg total) by mouth every 8 (eight) hours as needed.    potassium chloride SA (KLOR-CON) 20 MEQ tablet Take 1 tablet (20 mEq total) by mouth daily.    Tiotropium Bromide Monohydrate (SPIRIVA RESPIMAT) 2.5 MCG/ACT AERS USE 2 PUFFS BY MOUTH ONCE DAILY FOR COPD    carbidopa-levodopa (SINEMET IR) 25-100 MG tablet Take 2 tablets by mouth 3 (three) times daily.     entacapone (COMTAN) 200 MG tablet Take by mouth.    No  facility-administered encounter medications on file as of 09/29/2021.    Surgical History: Past Surgical History:  Procedure Laterality Date   BREAST BIOPSY Bilateral 1960's to 90's   breast cysts     CATARACT EXTRACTION, BILATERAL     ELBOW SURGERY     right   right wrist fracture     TUBAL LIGATION     WRIST SURGERY Left     Medical History: Past Medical History:  Diagnosis Date   Arthritis    Asthma    Crohn's disease ([place])    Emphysema of lung (HCC)    GERD (gastroesophageal reflux disease)    Hip fracture ([place]) 01/26/2017   History of chicken pox    History of heart attack    Hx of completed stroke    Hypercholesteremia    Hypertension    Parkinson disease ([place])    Pneumonia    Seasonal allergies    Sepsis ([place])    Septic shock ([place]) 10/08/2016   UTI (lower urinary tract infection)     Family History: Family History  Problem Relation Age of Onset   Cancer Sister        ovarian/uterine/breast   Breast cancer Sister 37   Cancer Sister        breast   Heart disease Sister    Breast cancer Sister 74   Heart disease Father    Cancer Father        liver   Pancreatic cancer Mother    Heart disease Brother    Heart attack Brother    Cancer Brother        liver   Heart disease Sister    Diabetes Maternal Grandmother     Social History: Social History   Socioeconomic History   Marital status: Widowed    Spouse name: Not on file   Number of children: 3   Years of education: Not on file   Highest education level: Some college, no degree  Occupational History   Occupation: retired  Tobacco Use   Smoking status: Former    Packs/day: 1.00    Years: 30.00    Pack years: 30.00    Types: Cigarettes    Quit date: 08/01/2008    Years since quitting: 13.1   Smokeless tobacco: Never  Vaping Use   Vaping Use: Never used  Substance and Sexual Activity   Alcohol use: Yes    Alcohol/week: 1.0 standard drink    Types: 1 Shots of liquor per week   Drug  use: No   Sexual activity: Not on file  Other Topics Concern   Not on file  Social History Narrative   Not on file   Social Determinants of Health   Financial Resource Strain: Low Risk    Difficulty of Paying Living Expenses: Not hard at all  Food Insecurity: No Food Insecurity   Worried About Running Out  of Food in the Last Year: Never true   [place] in the Last Year: Never true  Transportation Needs: No Transportation Needs   Lack of Transportation (Medical): No   Lack of Transportation (Non-Medical): No  Physical Activity: Inactive   Days of Exercise per Week: 0 days   Minutes of Exercise per Session: 0 min  Stress: No Stress Concern Present   Feeling of Stress : Not at all  Social Connections: Socially Isolated   Frequency of Communication with Friends and Family: More than three times a week   Frequency of Social Gatherings with Friends and Family: Twice a week   Attends Religious Services: Never   Marine scientist or Organizations: No   Attends Archivist Meetings: Never   Marital Status: Widowed  Human resources officer Violence: Not At Risk   Fear of Current or Ex-Partner: No   Emotionally Abused: No   Physically Abused: No   Sexually Abused: No    Vital Signs: Blood pressure 126/80, pulse 91, temperature 98.1 F (36.7 C), resp. rate 16, height 5' 1"  (1.549 m), weight 142 lb (64.4 kg), SpO2 98 %.  Examination: General Appearance: The patient is well-developed, well-nourished, and in no distress. Skin: Gross inspection of skin unremarkable. Head: normocephalic, no gross deformities. Eyes: no gross deformities noted. ENT: ears appear grossly normal no exudates. Neck: Supple. No thyromegaly. No LAD. Respiratory: No rhonchi no rales are noted at this time. Cardiovascular: Normal S1 and S2 without murmur or rub. Extremities: No cyanosis. pulses are equal. Neurologic: Alert and oriented. No involuntary movements.  LABS: No results found for this  or any previous visit (from the past 2160 hour(s)).  Radiology: MR BRAIN/IAC W WO CONTRAST  Result Date: 05/31/2020 CLINICAL DATA:  Four months of right-sided hearing loss, more recently becoming bilateral. EXAM: MRI HEAD WITHOUT AND WITH CONTRAST TECHNIQUE: Multiplanar, multiecho pulse sequences of the brain and surrounding structures were obtained without and with intravenous contrast. CONTRAST:  58m GADAVIST GADOBUTROL 1 MMOL/ML IV SOLN COMPARISON:  11/03/2017 FINDINGS: Brain: Remote perforator infarct on the left with wallerian degeneration seen into the brainstem. Chronic small vessel ischemia in the cerebral white matter. Nonspecific cerebral volume loss. Negative for retrocochlear lesion or labyrinthine signal abnormality. Clear CP angle cisterns. Vascular: Normal flow voids and vascular enhancements Skull and upper cervical spine: Normal marrow signal Sinuses/Orbits: Bilateral cataract resection. Mild mucosal thickening in the paranasal sinuses that is generalized. IMPRESSION: 1. Negative for retrocochlear lesion or other cause of symptoms. 2. Chronic small vessel ischemia with remote perforator infarct on the left. Electronically Signed   By: JMonte FantasiaM.D.   On: 05/31/2020 10:04    No results found.  No results found.    Assessment and Plan: Patient Active Problem List   Diagnosis Date Noted   Periprosthetic fracture around internal prosthetic right elbow joint 07/25/2020   Crohn's disease of colon without complication ([facility] [facility] 186/76/1950  Hearing loss 06/01/2019   Vertigo 06/01/2019   Hypokalemia 07/22/2018   Oral thrush 07/22/2018   Gastroenteritis 07/16/2018   Primary parkinsonism ([facility] 01/05/2018   SIADH (syndrome of inappropriate ADH production) ([place] 10/01/2017   Spinal stenosis 10/01/2017   Elevated troponin I level 11/18/2016   COPD (chronic obstructive pulmonary disease) with chronic bronchitis ([facility] 08/12/2016   Asthma without status asthmaticus 03/31/2016    Osteoporosis 03/31/2016   Fatty liver determined by biopsy 09/17/2015   Parotid swelling 09/13/2015   Urticaria 09/13/2015   History of CVA (cerebrovascular  accident) 08/21/2015   Senile purpura ([place]) 08/21/2015   Atrial tachycardia ([place]) 08/13/2015   Eczema 07/10/2015   Allergic rhinitis 06/26/2015   Anxiety 04/23/2015   Edema leg 03/27/2015   Generalized weakness 03/06/2015   Primary cardiomyopathy ([place]) 03/05/2015   CAD (coronary artery disease) 03/05/2015   Benign essential HTN 02/28/2015   Combined fat and carbohydrate induced hyperlipemia 09/06/2014   Acid reflux 08/01/2014    1. SOB (shortness of breath) Shortness of breath is at baseline she is actually done quite well we will do a follow-up on her spirometry and pulmonary functions - Spirometry with Graph  2. COPD (chronic obstructive pulmonary disease) with chronic bronchitis ([place]) Continue with Breo and Spiriva she is doing well on this combination therapy  3. Primary parkinsonism [facility]) Follow-up with the primary neurologist patient has been following with Dr. Brigitte Pulse  4. Gastroesophageal reflux disease without esophagitis Reflux control PPI as needed and reflux prevention at nighttime  General Counseling: I have discussed the findings of the evaluation and examination with Luvina.  I have also discussed any further diagnostic evaluation thatmay be needed or ordered today. Imanii verbalizes understanding of the findings of todays visit. We also reviewed her medications today and discussed drug interactions and side effects including but not limited excessive drowsiness and altered mental states. We also discussed that there is always a risk not just to her but also people around her. she has been encouraged to call the office with any questions or concerns that should arise related to todays visit.  Orders Placed This Encounter  Procedures   Spirometry with Graph    Order Specific Question:   Where should this test be  performed?    Answer:   [facility]    Order Specific Question:   Basic spirometry    Answer:   Yes     Time spent: 52  I have personally obtained a history, examined the patient, evaluated laboratory and imaging results, formulated the assessment and plan and placed orders.    Allyne Gee, MD [facility] Pulmonary and Critical Care Sleep medicine

## 2021-10-20 ENCOUNTER — Other Ambulatory Visit: Payer: Self-pay | Admitting: Internal Medicine

## 2021-10-27 DIAGNOSIS — S52221K Displaced transverse fracture of shaft of right ulna, subsequent encounter for closed fracture with nonunion: Secondary | ICD-10-CM | POA: Diagnosis not present

## 2021-10-28 DIAGNOSIS — S52001A Unspecified fracture of upper end of right ulna, initial encounter for closed fracture: Secondary | ICD-10-CM | POA: Diagnosis not present

## 2021-10-31 DIAGNOSIS — J45909 Unspecified asthma, uncomplicated: Secondary | ICD-10-CM | POA: Diagnosis not present

## 2021-10-31 DIAGNOSIS — I10 Essential (primary) hypertension: Secondary | ICD-10-CM | POA: Diagnosis not present

## 2021-10-31 DIAGNOSIS — W1839XD Other fall on same level, subsequent encounter: Secondary | ICD-10-CM | POA: Diagnosis not present

## 2021-10-31 DIAGNOSIS — S52091K Other fracture of upper end of right ulna, subsequent encounter for closed fracture with nonunion: Secondary | ICD-10-CM | POA: Diagnosis not present

## 2021-10-31 DIAGNOSIS — G20 Parkinson's disease: Secondary | ICD-10-CM | POA: Diagnosis not present

## 2021-10-31 DIAGNOSIS — M810 Age-related osteoporosis without current pathological fracture: Secondary | ICD-10-CM | POA: Diagnosis not present

## 2021-10-31 DIAGNOSIS — Z8673 Personal history of transient ischemic attack (TIA), and cerebral infarction without residual deficits: Secondary | ICD-10-CM | POA: Diagnosis not present

## 2021-10-31 DIAGNOSIS — Z01818 Encounter for other preprocedural examination: Secondary | ICD-10-CM | POA: Diagnosis not present

## 2021-10-31 DIAGNOSIS — M25521 Pain in right elbow: Secondary | ICD-10-CM | POA: Diagnosis not present

## 2021-10-31 DIAGNOSIS — R42 Dizziness and giddiness: Secondary | ICD-10-CM | POA: Diagnosis not present

## 2021-10-31 DIAGNOSIS — J449 Chronic obstructive pulmonary disease, unspecified: Secondary | ICD-10-CM | POA: Diagnosis not present

## 2021-10-31 DIAGNOSIS — I2510 Atherosclerotic heart disease of native coronary artery without angina pectoris: Secondary | ICD-10-CM | POA: Diagnosis not present

## 2021-11-03 DIAGNOSIS — I119 Hypertensive heart disease without heart failure: Secondary | ICD-10-CM | POA: Diagnosis not present

## 2021-11-03 DIAGNOSIS — J45909 Unspecified asthma, uncomplicated: Secondary | ICD-10-CM | POA: Diagnosis not present

## 2021-11-03 DIAGNOSIS — Z20822 Contact with and (suspected) exposure to covid-19: Secondary | ICD-10-CM | POA: Diagnosis not present

## 2021-11-03 DIAGNOSIS — I2510 Atherosclerotic heart disease of native coronary artery without angina pectoris: Secondary | ICD-10-CM | POA: Diagnosis not present

## 2021-11-03 DIAGNOSIS — I071 Rheumatic tricuspid insufficiency: Secondary | ICD-10-CM | POA: Diagnosis not present

## 2021-11-03 DIAGNOSIS — I10 Essential (primary) hypertension: Secondary | ICD-10-CM | POA: Diagnosis not present

## 2021-11-03 DIAGNOSIS — J449 Chronic obstructive pulmonary disease, unspecified: Secondary | ICD-10-CM | POA: Diagnosis not present

## 2021-11-03 DIAGNOSIS — Z01818 Encounter for other preprocedural examination: Secondary | ICD-10-CM | POA: Diagnosis not present

## 2021-11-04 DIAGNOSIS — Z8673 Personal history of transient ischemic attack (TIA), and cerebral infarction without residual deficits: Secondary | ICD-10-CM | POA: Diagnosis not present

## 2021-11-04 DIAGNOSIS — E782 Mixed hyperlipidemia: Secondary | ICD-10-CM | POA: Diagnosis not present

## 2021-11-04 DIAGNOSIS — R0989 Other specified symptoms and signs involving the circulatory and respiratory systems: Secondary | ICD-10-CM | POA: Diagnosis not present

## 2021-11-04 DIAGNOSIS — Z01818 Encounter for other preprocedural examination: Secondary | ICD-10-CM | POA: Diagnosis not present

## 2021-11-04 DIAGNOSIS — I10 Essential (primary) hypertension: Secondary | ICD-10-CM | POA: Diagnosis not present

## 2021-11-06 DIAGNOSIS — S52091K Other fracture of upper end of right ulna, subsequent encounter for closed fracture with nonunion: Secondary | ICD-10-CM | POA: Diagnosis not present

## 2021-11-06 DIAGNOSIS — J449 Chronic obstructive pulmonary disease, unspecified: Secondary | ICD-10-CM | POA: Diagnosis not present

## 2021-11-06 DIAGNOSIS — Z8673 Personal history of transient ischemic attack (TIA), and cerebral infarction without residual deficits: Secondary | ICD-10-CM | POA: Diagnosis not present

## 2021-11-06 DIAGNOSIS — R531 Weakness: Secondary | ICD-10-CM | POA: Diagnosis not present

## 2021-11-06 DIAGNOSIS — Z743 Need for continuous supervision: Secondary | ICD-10-CM | POA: Diagnosis not present

## 2021-11-06 DIAGNOSIS — T84112A Breakdown (mechanical) of internal fixation device of bone of right forearm, initial encounter: Secondary | ICD-10-CM | POA: Diagnosis not present

## 2021-11-06 DIAGNOSIS — F32A Depression, unspecified: Secondary | ICD-10-CM | POA: Diagnosis not present

## 2021-11-06 DIAGNOSIS — G20 Parkinson's disease: Secondary | ICD-10-CM | POA: Diagnosis not present

## 2021-11-06 DIAGNOSIS — I10 Essential (primary) hypertension: Secondary | ICD-10-CM | POA: Diagnosis not present

## 2021-11-06 DIAGNOSIS — M96631 Fracture of radius or ulna following insertion of orthopedic implant, joint prosthesis, or bone plate, right arm: Secondary | ICD-10-CM | POA: Diagnosis not present

## 2021-11-06 DIAGNOSIS — G8918 Other acute postprocedural pain: Secondary | ICD-10-CM | POA: Diagnosis not present

## 2021-11-06 DIAGNOSIS — D649 Anemia, unspecified: Secondary | ICD-10-CM | POA: Diagnosis not present

## 2021-11-06 DIAGNOSIS — I429 Cardiomyopathy, unspecified: Secondary | ICD-10-CM | POA: Diagnosis not present

## 2021-11-06 DIAGNOSIS — S52251D Displaced comminuted fracture of shaft of ulna, right arm, subsequent encounter for closed fracture with routine healing: Secondary | ICD-10-CM | POA: Diagnosis not present

## 2021-11-06 DIAGNOSIS — S52513S Displaced fracture of unspecified radial styloid process, sequela: Secondary | ICD-10-CM | POA: Diagnosis not present

## 2021-11-06 DIAGNOSIS — E782 Mixed hyperlipidemia: Secondary | ICD-10-CM | POA: Diagnosis not present

## 2021-11-06 DIAGNOSIS — M25821 Other specified joint disorders, right elbow: Secondary | ICD-10-CM | POA: Diagnosis not present

## 2021-11-11 DIAGNOSIS — G8918 Other acute postprocedural pain: Secondary | ICD-10-CM | POA: Insufficient documentation

## 2021-11-12 DIAGNOSIS — G20 Parkinson's disease: Secondary | ICD-10-CM | POA: Diagnosis not present

## 2021-11-12 DIAGNOSIS — Z743 Need for continuous supervision: Secondary | ICD-10-CM | POA: Diagnosis not present

## 2021-11-12 DIAGNOSIS — I25119 Atherosclerotic heart disease of native coronary artery with unspecified angina pectoris: Secondary | ICD-10-CM | POA: Diagnosis not present

## 2021-11-12 DIAGNOSIS — Z4789 Encounter for other orthopedic aftercare: Secondary | ICD-10-CM | POA: Diagnosis not present

## 2021-11-12 DIAGNOSIS — M8000XD Age-related osteoporosis with current pathological fracture, unspecified site, subsequent encounter for fracture with routine healing: Secondary | ICD-10-CM | POA: Diagnosis not present

## 2021-11-12 DIAGNOSIS — S52251D Displaced comminuted fracture of shaft of ulna, right arm, subsequent encounter for closed fracture with routine healing: Secondary | ICD-10-CM | POA: Diagnosis not present

## 2021-11-12 DIAGNOSIS — Z8673 Personal history of transient ischemic attack (TIA), and cerebral infarction without residual deficits: Secondary | ICD-10-CM | POA: Diagnosis not present

## 2021-11-12 DIAGNOSIS — S52513S Displaced fracture of unspecified radial styloid process, sequela: Secondary | ICD-10-CM | POA: Diagnosis not present

## 2021-11-12 DIAGNOSIS — W1839XA Other fall on same level, initial encounter: Secondary | ICD-10-CM | POA: Diagnosis not present

## 2021-11-12 DIAGNOSIS — R197 Diarrhea, unspecified: Secondary | ICD-10-CM | POA: Diagnosis not present

## 2021-11-12 DIAGNOSIS — I429 Cardiomyopathy, unspecified: Secondary | ICD-10-CM | POA: Diagnosis not present

## 2021-11-12 DIAGNOSIS — S52001D Unspecified fracture of upper end of right ulna, subsequent encounter for closed fracture with routine healing: Secondary | ICD-10-CM | POA: Diagnosis not present

## 2021-11-12 DIAGNOSIS — S52501A Unspecified fracture of the lower end of right radius, initial encounter for closed fracture: Secondary | ICD-10-CM | POA: Diagnosis not present

## 2021-11-12 DIAGNOSIS — J449 Chronic obstructive pulmonary disease, unspecified: Secondary | ICD-10-CM | POA: Diagnosis not present

## 2021-11-12 DIAGNOSIS — I10 Essential (primary) hypertension: Secondary | ICD-10-CM | POA: Diagnosis not present

## 2021-11-12 DIAGNOSIS — S52001A Unspecified fracture of upper end of right ulna, initial encounter for closed fracture: Secondary | ICD-10-CM | POA: Diagnosis not present

## 2021-11-12 DIAGNOSIS — G458 Other transient cerebral ischemic attacks and related syndromes: Secondary | ICD-10-CM | POA: Diagnosis not present

## 2021-11-12 DIAGNOSIS — I739 Peripheral vascular disease, unspecified: Secondary | ICD-10-CM | POA: Diagnosis not present

## 2021-11-12 DIAGNOSIS — I639 Cerebral infarction, unspecified: Secondary | ICD-10-CM | POA: Diagnosis not present

## 2021-11-12 DIAGNOSIS — Q782 Osteopetrosis: Secondary | ICD-10-CM | POA: Diagnosis not present

## 2021-11-12 DIAGNOSIS — I2510 Atherosclerotic heart disease of native coronary artery without angina pectoris: Secondary | ICD-10-CM | POA: Diagnosis not present

## 2021-11-12 DIAGNOSIS — F339 Major depressive disorder, recurrent, unspecified: Secondary | ICD-10-CM | POA: Diagnosis not present

## 2021-11-12 DIAGNOSIS — S42201D Unspecified fracture of upper end of right humerus, subsequent encounter for fracture with routine healing: Secondary | ICD-10-CM | POA: Diagnosis not present

## 2021-11-12 DIAGNOSIS — E782 Mixed hyperlipidemia: Secondary | ICD-10-CM | POA: Diagnosis not present

## 2021-11-12 DIAGNOSIS — R531 Weakness: Secondary | ICD-10-CM | POA: Diagnosis not present

## 2021-11-12 DIAGNOSIS — J45909 Unspecified asthma, uncomplicated: Secondary | ICD-10-CM | POA: Diagnosis not present

## 2021-11-12 DIAGNOSIS — S52601A Unspecified fracture of lower end of right ulna, initial encounter for closed fracture: Secondary | ICD-10-CM | POA: Diagnosis not present

## 2021-11-13 DIAGNOSIS — I639 Cerebral infarction, unspecified: Secondary | ICD-10-CM | POA: Diagnosis not present

## 2021-11-13 DIAGNOSIS — I739 Peripheral vascular disease, unspecified: Secondary | ICD-10-CM | POA: Diagnosis not present

## 2021-11-13 DIAGNOSIS — S52513S Displaced fracture of unspecified radial styloid process, sequela: Secondary | ICD-10-CM | POA: Diagnosis not present

## 2021-11-13 DIAGNOSIS — I2510 Atherosclerotic heart disease of native coronary artery without angina pectoris: Secondary | ICD-10-CM | POA: Diagnosis not present

## 2021-11-13 DIAGNOSIS — J449 Chronic obstructive pulmonary disease, unspecified: Secondary | ICD-10-CM | POA: Diagnosis not present

## 2021-11-13 DIAGNOSIS — F339 Major depressive disorder, recurrent, unspecified: Secondary | ICD-10-CM | POA: Diagnosis not present

## 2021-11-13 DIAGNOSIS — Q782 Osteopetrosis: Secondary | ICD-10-CM | POA: Diagnosis not present

## 2021-11-13 DIAGNOSIS — G20 Parkinson's disease: Secondary | ICD-10-CM | POA: Diagnosis not present

## 2021-11-18 DIAGNOSIS — S42201D Unspecified fracture of upper end of right humerus, subsequent encounter for fracture with routine healing: Secondary | ICD-10-CM | POA: Diagnosis not present

## 2021-11-18 DIAGNOSIS — R197 Diarrhea, unspecified: Secondary | ICD-10-CM | POA: Diagnosis not present

## 2021-11-20 DIAGNOSIS — Z9889 Other specified postprocedural states: Secondary | ICD-10-CM | POA: Insufficient documentation

## 2021-11-20 DIAGNOSIS — S52001A Unspecified fracture of upper end of right ulna, initial encounter for closed fracture: Secondary | ICD-10-CM | POA: Diagnosis not present

## 2021-11-20 DIAGNOSIS — W1839XA Other fall on same level, initial encounter: Secondary | ICD-10-CM | POA: Diagnosis not present

## 2021-11-20 DIAGNOSIS — S52601A Unspecified fracture of lower end of right ulna, initial encounter for closed fracture: Secondary | ICD-10-CM | POA: Diagnosis not present

## 2021-11-20 DIAGNOSIS — Z4789 Encounter for other orthopedic aftercare: Secondary | ICD-10-CM | POA: Diagnosis not present

## 2021-11-20 DIAGNOSIS — S52501A Unspecified fracture of the lower end of right radius, initial encounter for closed fracture: Secondary | ICD-10-CM | POA: Diagnosis not present

## 2021-11-21 DIAGNOSIS — J45909 Unspecified asthma, uncomplicated: Secondary | ICD-10-CM | POA: Diagnosis not present

## 2021-11-21 DIAGNOSIS — J449 Chronic obstructive pulmonary disease, unspecified: Secondary | ICD-10-CM | POA: Diagnosis not present

## 2021-11-21 DIAGNOSIS — G20 Parkinson's disease: Secondary | ICD-10-CM | POA: Diagnosis not present

## 2021-11-21 DIAGNOSIS — S52001D Unspecified fracture of upper end of right ulna, subsequent encounter for closed fracture with routine healing: Secondary | ICD-10-CM | POA: Diagnosis not present

## 2021-11-21 DIAGNOSIS — I25119 Atherosclerotic heart disease of native coronary artery with unspecified angina pectoris: Secondary | ICD-10-CM | POA: Diagnosis not present

## 2021-11-21 DIAGNOSIS — M8000XD Age-related osteoporosis with current pathological fracture, unspecified site, subsequent encounter for fracture with routine healing: Secondary | ICD-10-CM | POA: Diagnosis not present

## 2021-11-24 DIAGNOSIS — S52001D Unspecified fracture of upper end of right ulna, subsequent encounter for closed fracture with routine healing: Secondary | ICD-10-CM | POA: Diagnosis not present

## 2021-11-24 DIAGNOSIS — G20 Parkinson's disease: Secondary | ICD-10-CM | POA: Diagnosis not present

## 2021-11-24 DIAGNOSIS — M8000XD Age-related osteoporosis with current pathological fracture, unspecified site, subsequent encounter for fracture with routine healing: Secondary | ICD-10-CM | POA: Diagnosis not present

## 2021-11-27 DIAGNOSIS — I429 Cardiomyopathy, unspecified: Secondary | ICD-10-CM | POA: Diagnosis not present

## 2021-11-27 DIAGNOSIS — I10 Essential (primary) hypertension: Secondary | ICD-10-CM | POA: Diagnosis not present

## 2021-11-27 DIAGNOSIS — E782 Mixed hyperlipidemia: Secondary | ICD-10-CM | POA: Diagnosis not present

## 2021-11-27 DIAGNOSIS — Z8673 Personal history of transient ischemic attack (TIA), and cerebral infarction without residual deficits: Secondary | ICD-10-CM | POA: Diagnosis not present

## 2021-11-27 DIAGNOSIS — G458 Other transient cerebral ischemic attacks and related syndromes: Secondary | ICD-10-CM | POA: Diagnosis not present

## 2021-11-27 DIAGNOSIS — J449 Chronic obstructive pulmonary disease, unspecified: Secondary | ICD-10-CM | POA: Diagnosis not present

## 2021-11-27 DIAGNOSIS — I2510 Atherosclerotic heart disease of native coronary artery without angina pectoris: Secondary | ICD-10-CM | POA: Diagnosis not present

## 2021-11-29 DIAGNOSIS — S52001D Unspecified fracture of upper end of right ulna, subsequent encounter for closed fracture with routine healing: Secondary | ICD-10-CM | POA: Diagnosis not present

## 2021-11-29 DIAGNOSIS — M6281 Muscle weakness (generalized): Secondary | ICD-10-CM | POA: Diagnosis not present

## 2021-12-02 ENCOUNTER — Other Ambulatory Visit: Payer: Self-pay | Admitting: Student

## 2021-12-02 DIAGNOSIS — G458 Other transient cerebral ischemic attacks and related syndromes: Secondary | ICD-10-CM

## 2021-12-15 ENCOUNTER — Ambulatory Visit
Admission: RE | Admit: 2021-12-15 | Discharge: 2021-12-15 | Disposition: A | Discharge status: Home / Self Care | Payer: Medicare PPO | Source: Ambulatory Visit | Attending: Family Medicine | Admitting: Family Medicine

## 2021-12-15 ENCOUNTER — Encounter: Payer: Self-pay | Admitting: Family Medicine

## 2021-12-15 ENCOUNTER — Ambulatory Visit
Admission: RE | Admit: 2021-12-15 | Discharge: 2021-12-15 | Disposition: A | Discharge status: Home / Self Care | Payer: Medicare PPO | Attending: Family Medicine | Admitting: Family Medicine

## 2021-12-15 ENCOUNTER — Other Ambulatory Visit: Payer: Self-pay

## 2021-12-15 ENCOUNTER — Ambulatory Visit: Payer: Medicare PPO | Admitting: Family Medicine

## 2021-12-15 VITALS — BP 116/86 | HR 95 | Temp 98.1°F | Resp 16

## 2021-12-15 DIAGNOSIS — G458 Other transient cerebral ischemic attacks and related syndromes: Secondary | ICD-10-CM | POA: Diagnosis not present

## 2021-12-15 DIAGNOSIS — D649 Anemia, unspecified: Secondary | ICD-10-CM | POA: Diagnosis not present

## 2021-12-15 DIAGNOSIS — R051 Acute cough: Secondary | ICD-10-CM

## 2021-12-15 DIAGNOSIS — G20 Parkinson's disease: Secondary | ICD-10-CM

## 2021-12-15 DIAGNOSIS — I10 Essential (primary) hypertension: Secondary | ICD-10-CM

## 2021-12-15 DIAGNOSIS — R059 Cough, unspecified: Secondary | ICD-10-CM | POA: Diagnosis not present

## 2021-12-15 DIAGNOSIS — J449 Chronic obstructive pulmonary disease, unspecified: Secondary | ICD-10-CM | POA: Diagnosis not present

## 2021-12-15 DIAGNOSIS — E782 Mixed hyperlipidemia: Secondary | ICD-10-CM

## 2021-12-15 DIAGNOSIS — M810 Age-related osteoporosis without current pathological fracture: Secondary | ICD-10-CM | POA: Diagnosis not present

## 2021-12-15 MED ORDER — BENZONATATE 100 MG PO CAPS: 100.0000 mg | ORAL_CAPSULE | Freq: Two times a day (BID) | ORAL | 0 refills | Status: DC | PRN

## 2021-12-15 NOTE — Progress Notes (Signed)
Established patient visit   Patient: Lori Brady   DOB: 08-23-1946   76 y.o. Female  MRN: [id number] Visit Date: 12/15/2021  Today's healthcare provider: Shirlee Latch, MD   Chief Complaint  Patient presents with   follow up HTN   Subjective    HPI   COPD Runny nose started on Saturday Productive cough with green sputum No fever or known exposures  Parkinsonism Difficulty walking, especially with limited use of right arm Larey Seat four times in December  Osteoporosis Has been taking alendronate   Health Maintenance Due to mobility issues and recovery from recent surgery, planning on addressing colonoscopy, mammogram, covid booster, and shingles vaccine at a later date   Medications: Outpatient Medications Prior to Visit  Medication Sig   acetaminophen (TYLENOL) 500 MG tablet Take 500 mg by mouth every 6 (six) hours as needed.    albuterol (ACCUNEB) 0.63 MG/3ML nebulizer solution Take 1 ampule by nebulization every 6 (six) hours as needed for wheezing.   albuterol (VENTOLIN HFA) 108 (90 Base) MCG/ACT inhaler Inhale 2 puffs into the lungs every 6 (six) hours as needed for wheezing or shortness of breath.   alendronate (FOSAMAX) 70 MG tablet One tab every Sunday on empty stomach with a full glass of water. Do not lie down or eat/drink anything else for the next 30 min.   aspirin 81 MG tablet Take 81 mg by mouth daily.   atorvastatin (LIPITOR) 40 MG tablet Take 1 tablet (40 mg total) by mouth at bedtime.   azelastine (ASTELIN) 0.1 % nasal spray Place 2 sprays into both nostrils daily at 6 (six) AM.   betamethasone valerate (VALISONE) 0.1 % cream APPLY TOPICALLY TO AFFECTED AREA SPARINGLY 2 TIMES DAILY.   BREO ELLIPTA 100-25 MCG/INH AEPB INHALE 1 PUFF BY MOUTH ONCE DAILY AS DIRECTED   carbidopa-levodopa (SINEMET CR) 50-200 MG tablet Take 1 tablet by mouth daily. Before dose   cetirizine (ZYRTEC) 10 MG tablet TAKE 1 TABLET BY MOUTH ONCE DAILY   Cholecalciferol (VITAMIN  D) 2000 UNITS CAPS Take 1 capsule by mouth daily.   citalopram (CELEXA) 20 MG tablet TAKE 1 TABLET BY MOUTH ONCE DAILY   clobetasol (TEMOVATE) 0.05 % external solution Apply 1 application topically 2 (two) times daily. As needed   clobetasol cream (TEMOVATE) 0.05 % Apply 1 application topically 2 (two) times daily. As needed   doxepin (SINEQUAN) 25 MG capsule TAKE 1 CAPSULE BY MOUTH AT BEDTIME   fluticasone (FLONASE) 50 MCG/ACT nasal spray PLACE 2 SPRAYS INTO BOTHS NOSTRILS DAILY   ketoconazole (NIZORAL) 2 % shampoo Apply 1 application topically 2 (two) times a week.   meloxicam (MOBIC) 7.5 MG tablet TAKE 1 TABLET BY MOUTH ONCE DAILY   montelukast (SINGULAIR) 10 MG tablet TAKE 1 TABLET BY MOUTH ONCE DAILY   omeprazole (PRILOSEC) 40 MG capsule Take 1 capsule (40 mg total) by mouth daily.   ondansetron (ZOFRAN ODT) 4 MG disintegrating tablet Take 1 tablet (4 mg total) by mouth every 8 (eight) hours as needed.   potassium chloride SA (KLOR-CON) 20 MEQ tablet Take 1 tablet (20 mEq total) by mouth daily.   Tiotropium Bromide Monohydrate (SPIRIVA RESPIMAT) 2.5 MCG/ACT AERS INHALE 2 PUFFS BY MOUTH ONCE DAILY FOR COPD   carbidopa-levodopa (SINEMET IR) 25-100 MG tablet Take 2 tablets by mouth 3 (three) times daily.    entacapone (COMTAN) 200 MG tablet Take by mouth.   No facility-administered medications prior to visit.    Review of Systems  HENT:  Positive for congestion and postnasal drip.   Respiratory:  Positive for cough.   Cardiovascular: Negative.   Gastrointestinal: Negative.       Objective    BP 116/86 (BP Location: Left Arm, Patient Position: Sitting, Cuff Size: Normal)    Pulse 95    Temp 98.1 F (36.7 C) (Oral)    Resp 16  {Show previous vital signs (optional):23777}  Physical Exam Vitals reviewed.  Constitutional:      General: She is not in acute distress.    Appearance: Normal appearance. She is not ill-appearing or toxic-appearing.  HENT:     Head: Normocephalic and  atraumatic.     Right Ear: External ear normal.     Left Ear: External ear normal.     Nose: Congestion present.     Mouth/Throat:     Mouth: Mucous membranes are moist.     Pharynx: Oropharynx is clear. No oropharyngeal exudate or posterior oropharyngeal erythema.  Eyes:     General: No scleral icterus.    Extraocular Movements: Extraocular movements intact.     Conjunctiva/sclera: Conjunctivae normal.     Pupils: Pupils are equal, round, and reactive to light.  Cardiovascular:     Rate and Rhythm: Normal rate and regular rhythm.     Pulses: Normal pulses.     Heart sounds: Normal heart sounds. No murmur heard.   No friction rub. No gallop.  Pulmonary:     Effort: Pulmonary effort is normal. No respiratory distress.     Breath sounds: No rhonchi.  Chest:     Chest wall: No tenderness.  Abdominal:     General: Abdomen is flat. There is no distension.     Palpations: Abdomen is soft.     Tenderness: There is no abdominal tenderness.  Musculoskeletal:        General: Signs of injury present.     Cervical back: Normal range of motion and neck supple.     Right lower leg: No edema.     Left lower leg: No edema.     Comments: Recent surgery on right arm and wrist  Skin:    General: Skin is warm and dry.     Capillary Refill: Capillary refill takes less than 2 seconds.     Findings: Bruising present. No lesion or rash.  Neurological:     General: No focal deficit present.     Mental Status: She is alert and oriented to person, place, and time. Mental status is at baseline.  Psychiatric:        Mood and Affect: Mood normal.        Behavior: Behavior normal.     No results found for any visits on 12/15/21.  Assessment & Plan     Problem List Items Addressed This Visit       Cardiovascular and Mediastinum   Benign essential HTN - Primary    Well controlled Not currently on medications Recheck CMP      Relevant Orders   Comprehensive metabolic panel     Respiratory    COPD (chronic obstructive pulmonary disease) with chronic bronchitis (HCC)    Chronic, well controlled F/b Pulmonology Continue current medications See note below for cough      Relevant Medications   benzonatate (TESSALON) 100 MG capsule     Nervous and Auditory   Primary parkinsonism (HCC)    F/b neuro Worsening, had four falls in December        Musculoskeletal and  Integument   Osteoporosis    Bone density scan from 01/2021 showed efficacy of alendronate Has had recurrent fractures in right arm High fall risk due to parkinsonism Continue Vit D supplement Recheck Vit D level Continue alendronate F/b endocrinology      Relevant Orders   VITAMIN D 25 Hydroxy (Vit-D Deficiency, Fractures)     Other   Combined fat and carbohydrate induced hyperlipemia    Previously well controlled Reviewed last lipid panel Continue atorvastatin Recheck lipid panel      Relevant Orders   Comprehensive metabolic panel   Lipid panel   Other Visit Diagnoses     Anemia, unspecified type       New onset anemia during recent hospitalization Received RBC transfusion Recheck CBC w/ diff   Relevant Orders   CBC w/Diff/Platelet   Acute cough       Onset of congestion, runny nose on Sat 12/13/21 Has productive cough with yellow-green sputum No known sick contacts Likely viral URI, but need to r/o pneumonia given her risk factors No wheezing on exam to suggest COPD exacerbation No fever Get chest x-ray   Relevant Orders   DG Chest 2 View        Return in about 2 months (around 02/12/2022) for CPE, as scheduled.      Gwyndolyn Kaufman, Medical Student 12/15/2021, 5:12 PM   Patient seen along with MS3 student Gwyndolyn Kaufman. I personally evaluated this patient along with the student, and verified all aspects of the history, physical exam, and medical decision making as documented by the student. I agree with the student's documentation and have made all necessary edits.  Raden Byington,  Marzella Schlein, MD, MPH [facility]

## 2021-12-15 NOTE — Assessment & Plan Note (Addendum)
Chronic, well controlled F/b Pulmonology Continue current medications See note below for cough

## 2021-12-15 NOTE — Assessment & Plan Note (Addendum)
Bone density scan from 01/2021 showed efficacy of alendronate Has had recurrent fractures in right arm High fall risk due to parkinsonism Continue Vit D supplement Recheck Vit D level Continue alendronate F/b endocrinology

## 2021-12-15 NOTE — Assessment & Plan Note (Signed)
F/b neuro Worsening, had four falls in December

## 2021-12-15 NOTE — Assessment & Plan Note (Signed)
Previously well controlled Reviewed last lipid panel Continue atorvastatin Recheck lipid panel

## 2021-12-15 NOTE — Assessment & Plan Note (Signed)
Well controlled Not currently on medications Recheck CMP

## 2021-12-16 ENCOUNTER — Ambulatory Visit
Admission: RE | Admit: 2021-12-16 | Discharge: 2021-12-16 | Disposition: A | Discharge status: Home / Self Care | Payer: Medicare PPO | Source: Ambulatory Visit | Attending: Student | Admitting: Student

## 2021-12-16 ENCOUNTER — Other Ambulatory Visit: Payer: Self-pay | Admitting: Family Medicine

## 2021-12-16 DIAGNOSIS — Z8673 Personal history of transient ischemic attack (TIA), and cerebral infarction without residual deficits: Secondary | ICD-10-CM | POA: Diagnosis not present

## 2021-12-16 DIAGNOSIS — I6502 Occlusion and stenosis of left vertebral artery: Secondary | ICD-10-CM | POA: Diagnosis not present

## 2021-12-16 DIAGNOSIS — R531 Weakness: Secondary | ICD-10-CM | POA: Diagnosis not present

## 2021-12-16 DIAGNOSIS — G458 Other transient cerebral ischemic attacks and related syndromes: Secondary | ICD-10-CM

## 2021-12-16 DIAGNOSIS — R051 Acute cough: Secondary | ICD-10-CM | POA: Diagnosis not present

## 2021-12-16 DIAGNOSIS — R200 Anesthesia of skin: Secondary | ICD-10-CM | POA: Diagnosis not present

## 2021-12-16 LAB — LIPID PANEL
Chol/HDL Ratio: 3.9 ratio (ref 0.0–4.4)
Cholesterol, Total: 126 mg/dL (ref 100–199)
HDL: 32 mg/dL — ABNORMAL LOW (ref >39)
LDL Chol Calc (NIH): 67 mg/dL (ref 0–99)
Triglycerides: 154 mg/dL — ABNORMAL HIGH (ref 0–149)
VLDL Cholesterol Cal: 27 mg/dL (ref 5–40)

## 2021-12-16 LAB — COMPREHENSIVE METABOLIC PANEL
ALT: 6 IU/L (ref 0–32)
AST: 28 IU/L (ref 0–40)
Albumin/Globulin Ratio: 1.8 (ref 1.2–2.2)
Albumin: 4.5 g/dL (ref 3.7–4.7)
Alkaline Phosphatase: 153 IU/L — ABNORMAL HIGH (ref 44–121)
BUN/Creatinine Ratio: 22 (ref 12–28)
BUN: 14 mg/dL (ref 8–27)
Bilirubin Total: 0.5 mg/dL (ref 0.0–1.2)
CO2: 24 mmol/L (ref 20–29)
Calcium: 9.7 mg/dL (ref 8.7–10.3)
Chloride: 101 mmol/L (ref 96–106)
Creatinine, Ser: 0.64 mg/dL (ref 0.57–1.00)
Globulin, Total: 2.5 g/dL (ref 1.5–4.5)
Glucose: 105 mg/dL — ABNORMAL HIGH (ref 70–99)
Potassium: 4.9 mmol/L (ref 3.5–5.2)
Sodium: 139 mmol/L (ref 134–144)
Total Protein: 7 g/dL (ref 6.0–8.5)
eGFR: 93 mL/min/{1.73_m2} (ref >59)

## 2021-12-16 LAB — CBC WITH DIFFERENTIAL/PLATELET
Basophils Absolute: 0 10*3/uL (ref 0.0–0.2)
Basos: 0 %
EOS (ABSOLUTE): 0.3 10*3/uL (ref 0.0–0.4)
Eos: 3 %
Hematocrit: 33.7 % — ABNORMAL LOW (ref 34.0–46.6)
Hemoglobin: 10.5 g/dL — ABNORMAL LOW (ref 11.1–15.9)
Immature Grans (Abs): 0.1 10*3/uL (ref 0.0–0.1)
Immature Granulocytes: 1 %
Lymphocytes Absolute: 2.3 10*3/uL (ref 0.7–3.1)
Lymphs: 22 %
MCH: 26.1 pg — ABNORMAL LOW (ref 26.6–33.0)
MCHC: 31.2 g/dL — ABNORMAL LOW (ref 31.5–35.7)
MCV: 84 fL (ref 79–97)
Monocytes Absolute: 0.9 10*3/uL (ref 0.1–0.9)
Monocytes: 8 %
Neutrophils Absolute: 7 10*3/uL (ref 1.4–7.0)
Neutrophils: 66 %
Platelets: 234 10*3/uL (ref 150–450)
RBC: 4.03 x10E6/uL (ref 3.77–5.28)
RDW: 13.6 % (ref 11.7–15.4)
WBC: 10.6 10*3/uL (ref 3.4–10.8)

## 2021-12-16 LAB — VITAMIN D 25 HYDROXY (VIT D DEFICIENCY, FRACTURES): Vit D, 25-Hydroxy: 38.3 ng/mL (ref 30.0–100.0)

## 2021-12-16 MED ORDER — IOHEXOL 350 MG/ML SOLN
75.0000 mL | Freq: Once | INTRAVENOUS | Status: AC | PRN
  Administered 2021-12-16: 75 mL via INTRAVENOUS

## 2021-12-16 NOTE — Telephone Encounter (Signed)
Copied from CRM [id number]. Topic: Quick Communication - Rx Refill/Question >> Dec 16, 2021 12:05 PM Gaetana Michaelis A wrote: Medication: alendronate (FOSAMAX) 70 MG tablet [[id number]]   Has the patient contacted their pharmacy? No. The patient spoke with their PCP regarding this  prescription yesterday 12/15/21 (Agent: If no, request that the patient contact the pharmacy for the refill. If patient does not wish to contact the pharmacy document the reason why and proceed with request.) (Agent: If yes, when and what did the pharmacy advise?)  Preferred Pharmacy (with phone number or street name): [facility] - [place], [place] - [address] [place] [address] Phone: [phone] Fax: [phone] Hours: Not open 24 hours   Has the patient been seen for an appointment in the last year OR does the patient have an upcoming appointment? Yes.    Agent: Please be advised that RX refills may take up to 3 business days. We ask that you follow-up with your pharmacy.

## 2021-12-16 NOTE — Telephone Encounter (Signed)
Requested medication (s) are due for refill today: yes  Requested medication (s) are on the active medication list: yes  Last refill:  12/23/2018  Future visit scheduled: yes  Notes to clinic:  Unable to refill per protocol due to failed labs, no updated results.      Requested Prescriptions  Pending Prescriptions Disp Refills   alendronate (FOSAMAX) 70 MG tablet      Sig: Take with a full glass of water on an empty stomach.     Endocrinology:  Bisphosphonates Failed - 12/16/2021 12:52 PM      Failed - Mg Level in normal range and within 360 days    Magnesium  Date Value Ref Range Status  07/18/2018 2.1 1.7 - 2.4 mg/dL Final    Comment:    Performed at [facility], [address] [place], [place] [address]          Failed - Phosphate in normal range and within 360 days    Phosphorus  Date Value Ref Range Status  08/24/2018 3.1 2.5 - 4.5 mg/dL Final          Failed - Bone Mineral Density or Dexa Scan completed in the last 2 years      Passed - Ca in normal range and within 360 days    Calcium  Date Value Ref Range Status  12/15/2021 9.7 8.7 - 10.3 mg/dL Final   Calcium, Total  Date Value Ref Range Status  03/10/2015 8.6 (L) mg/dL Final    Comment:    8.9-10.3 NOTE: New Reference Range  01/22/15           Passed - Vitamin D in normal range and within 360 days    Vit D, 25-Hydroxy  Date Value Ref Range Status  12/15/2021 38.3 30.0 - 100.0 ng/mL Final    Comment:    Vitamin D deficiency has been defined by the Institute of Medicine and an Endocrine Society practice guideline as a level of serum 25-OH vitamin D less than 20 ng/mL (1,2). The Endocrine Society went on to further define vitamin D insufficiency as a level between 21 and 29 ng/mL (2). 1. IOM (Institute of Medicine). 2010. Dietary reference    intakes for calcium and D. [place]: The    [employer]. 2. Holick MF, [place], Bischoff-Ferrari HA, et al.    Evaluation,  treatment, and prevention of vitamin D    deficiency: an Endocrine Society clinical practice    guideline. JCEM. 2011 Jul; 96(7):1911-30.           Passed - Cr in normal range and within 360 days    Creatinine  Date Value Ref Range Status  03/10/2015 0.79 mg/dL Final    Comment:    0.44-1.00 NOTE: New Reference Range  01/22/15    Creatinine, Ser  Date Value Ref Range Status  12/15/2021 0.64 0.57 - 1.00 mg/dL Final          Passed - eGFR is 30 or above and within 360 days    EGFR (African American)  Date Value Ref Range Status  03/10/2015 >60  Final   GFR calc Af Amer  Date Value Ref Range Status  07/25/2020 83 >59 mL/min/1.73 Final    Comment:    **[facility] currently reports eGFR in compliance with the current**   recommendations of the [employer]. [facility] will   update reporting as new guidelines are published from the NKF-ASN   Task force.    EGFR (  Laqueta Jean.)  Date Value Ref Range Status  03/10/2015 >60  Final    Comment:    eGFR values <65m/min/1.73 m2 may be an indication of chronic kidney disease (CKD). Calculated eGFR is useful in patients with stable renal function. The eGFR calculation will not be reliable in acutely ill patients when serum creatinine is changing rapidly. It is not useful in patients on dialysis. The eGFR calculation may not be applicable to patients at the low and high extremes of body sizes, pregnant women, and vegetarians.    GFR calc non Af Amer  Date Value Ref Range Status  07/25/2020 72 >59 mL/min/1.73 Final   eGFR  Date Value Ref Range Status  12/15/2021 93 >59 mL/min/1.73 Final          Passed - Valid encounter within last 12 months    Recent Outpatient Visits           Yesterday [facility]ign essential HT[facility]   10 months ago Encounter for annual physical exam   [facility]   1 year ago [facility]ign essential HT[facility]   1 year ago Encounter for annual physical exam   [facility]   2 years ago Benign essential HTN   [place] [place]       Future Appointments             In 1 month Bacigalupo, [facility] [place]

## 2021-12-18 ENCOUNTER — Other Ambulatory Visit: Payer: Self-pay | Admitting: Family Medicine

## 2021-12-18 DIAGNOSIS — F419 Anxiety disorder, unspecified: Secondary | ICD-10-CM

## 2021-12-18 MED ORDER — ALENDRONATE SODIUM 70 MG PO TABS
70.0000 mg | ORAL_TABLET | ORAL | 3 refills | Status: DC
Start: 2021-12-18 — End: 2022-12-28

## 2021-12-18 NOTE — Telephone Encounter (Signed)
Requested Prescriptions  Pending Prescriptions Disp Refills   citalopram (CELEXA) 20 MG tablet [Pharmacy Med Name: CITALOPRAM HYDROBROMIDE 20 MG TAB] 90 tablet 0    Sig: TAKE 1 TABLET BY MOUTH ONCE DAILY     Psychiatry:  Antidepressants - SSRI Passed - 12/18/2021  1:03 PM      Passed - Valid encounter within last 6 months    Recent Outpatient Visits          3 days ago Benign essential HTN   [facility] [place], Marzella Schlein, MD   10 months ago Encounter for annual physical exam   [facility] [place], Marzella Schlein, MD   1 year ago Benign essential HTN   [facility] [place], Marzella Schlein, MD   1 year ago Encounter for annual physical exam   [facility] [place], Marzella Schlein, MD   2 years ago Benign essential HTN   [facility] Bacigalupo, Marzella Schlein, MD      Future Appointments            In 1 month Bacigalupo, Marzella Schlein, MD [facility]

## 2021-12-19 ENCOUNTER — Other Ambulatory Visit: Payer: Self-pay | Admitting: Family Medicine

## 2021-12-19 DIAGNOSIS — S52091K Other fracture of upper end of right ulna, subsequent encounter for closed fracture with nonunion: Secondary | ICD-10-CM | POA: Diagnosis not present

## 2021-12-19 DIAGNOSIS — Z4789 Encounter for other orthopedic aftercare: Secondary | ICD-10-CM | POA: Diagnosis not present

## 2021-12-19 DIAGNOSIS — W1839XD Other fall on same level, subsequent encounter: Secondary | ICD-10-CM | POA: Diagnosis not present

## 2021-12-19 NOTE — Telephone Encounter (Signed)
Requested Prescriptions  Pending Prescriptions Disp Refills   meloxicam (MOBIC) 7.5 MG tablet [Pharmacy Med Name: MELOXICAM 7.5 MG TAB] 30 tablet 2    Sig: TAKE 1 TABLET BY MOUTH ONCE DAILY     Analgesics:  COX2 Inhibitors Failed - 12/19/2021  3:08 PM      Failed - Manual Review: Labs are only required if the patient has taken medication for more than 8 weeks.      Failed - HGB in normal range and within 360 days    Hemoglobin  Date Value Ref Range Status  12/15/2021 10.5 (L) 11.1 - 15.9 g/dL Final         Failed - HCT in normal range and within 360 days    Hematocrit  Date Value Ref Range Status  12/15/2021 33.7 (L) 34.0 - 46.6 % Final         Passed - Cr in normal range and within 360 days    Creatinine  Date Value Ref Range Status  03/10/2015 0.79 mg/dL Final    Comment:    0.44-1.00 NOTE: New Reference Range  01/22/15    Creatinine, Ser  Date Value Ref Range Status  12/15/2021 0.64 0.57 - 1.00 mg/dL Final         Passed - AST in normal range and within 360 days    AST  Date Value Ref Range Status  12/15/2021 28 0 - 40 IU/L Final   SGOT(AST)  Date Value Ref Range Status  04/26/2014 27 15 - 37 Unit/L Final         Passed - ALT in normal range and within 360 days    ALT  Date Value Ref Range Status  12/15/2021 6 0 - 32 IU/L Final   SGPT (ALT)  Date Value Ref Range Status  04/26/2014 37 12 - 78 U/L Final         Passed - eGFR is 30 or above and within 360 days    EGFR (African American)  Date Value Ref Range Status  03/10/2015 >60  Final   GFR calc Af Amer  Date Value Ref Range Status  07/25/2020 83 >59 mL/min/1.73 Final    Comment:    **[facility] currently reports eGFR in compliance with the current**   recommendations of the [employer]. [facility] will   update reporting as new guidelines are published from the NKF-ASN   Task force.    EGFR (Non-African Amer.)  Date Value Ref Range Status  03/10/2015 >60  Final    Comment:    eGFR  values <32m/min/1.73 m2 may be an indication of chronic kidney disease (CKD). Calculated eGFR is useful in patients with stable renal function. The eGFR calculation will not be reliable in acutely ill patients when serum creatinine is changing rapidly. It is not useful in patients on dialysis. The eGFR calculation may not be applicable to patients at the low and high extremes of body sizes, pregnant women, and vegetarians.    GFR calc non Af Amer  Date Value Ref Range Status  07/25/2020 72 >59 mL/min/1.73 Final   eGFR  Date Value Ref Range Status  12/15/2021 93 >59 mL/min/1.73 Final         Passed - Patient is not pregnant      Passed - Valid encounter within last 12 months    Recent Outpatient Visits          4 days ago Benign essential HTN   [facility] [place] [facility]  10 months ago Encounter for annual physical exam   [facility] [place], Dionne Bucy, MD   1 year ago Benign essential HTN   [facility] [place], Dionne Bucy, MD   1 year ago Encounter for annual physical exam   [facility] [place], Dionne Bucy, MD   2 years ago Benign essential HTN   [facility] [place], Dionne Bucy, MD      Future Appointments            In 1 month Bacigalupo, Dionne Bucy, MD [facility], [place]

## 2022-01-06 ENCOUNTER — Encounter: Payer: Self-pay | Admitting: Emergency Medicine

## 2022-01-06 ENCOUNTER — Other Ambulatory Visit: Payer: Self-pay

## 2022-01-06 ENCOUNTER — Emergency Department: Payer: Medicare PPO

## 2022-01-06 ENCOUNTER — Emergency Department
Admission: EM | Admit: 2022-01-06 | Discharge: 2022-01-06 | Disposition: A | Discharge status: Home / Self Care | Payer: Medicare PPO | Attending: Student in an Organized Health Care Education/Training Program | Admitting: Student in an Organized Health Care Education/Training Program

## 2022-01-06 DIAGNOSIS — Z7982 Long term (current) use of aspirin: Secondary | ICD-10-CM | POA: Insufficient documentation

## 2022-01-06 DIAGNOSIS — S2241XA Multiple fractures of ribs, right side, initial encounter for closed fracture: Secondary | ICD-10-CM | POA: Diagnosis not present

## 2022-01-06 DIAGNOSIS — S42201A Unspecified fracture of upper end of right humerus, initial encounter for closed fracture: Secondary | ICD-10-CM | POA: Diagnosis not present

## 2022-01-06 DIAGNOSIS — S4991XA Unspecified injury of right shoulder and upper arm, initial encounter: Secondary | ICD-10-CM | POA: Diagnosis present

## 2022-01-06 DIAGNOSIS — S42291A Other displaced fracture of upper end of right humerus, initial encounter for closed fracture: Secondary | ICD-10-CM

## 2022-01-06 DIAGNOSIS — W01198A Fall on same level from slipping, tripping and stumbling with subsequent striking against other object, initial encounter: Secondary | ICD-10-CM | POA: Diagnosis not present

## 2022-01-06 DIAGNOSIS — Y92002 Bathroom of unspecified non-institutional (private) residence single-family (private) house as the place of occurrence of the external cause: Secondary | ICD-10-CM | POA: Insufficient documentation

## 2022-01-06 DIAGNOSIS — S42351A Displaced comminuted fracture of shaft of humerus, right arm, initial encounter for closed fracture: Secondary | ICD-10-CM | POA: Diagnosis not present

## 2022-01-06 DIAGNOSIS — S4981XA Other specified injuries of right shoulder and upper arm, initial encounter: Secondary | ICD-10-CM | POA: Diagnosis not present

## 2022-01-06 DIAGNOSIS — S0990XA Unspecified injury of head, initial encounter: Secondary | ICD-10-CM | POA: Diagnosis not present

## 2022-01-06 DIAGNOSIS — R609 Edema, unspecified: Secondary | ICD-10-CM | POA: Diagnosis not present

## 2022-01-06 DIAGNOSIS — W19XXXA Unspecified fall, initial encounter: Secondary | ICD-10-CM | POA: Diagnosis not present

## 2022-01-06 DIAGNOSIS — M25511 Pain in right shoulder: Secondary | ICD-10-CM | POA: Diagnosis not present

## 2022-01-06 MED ORDER — HYDROCODONE-ACETAMINOPHEN 5-325 MG PO TABS
1.0000 | ORAL_TABLET | ORAL | Status: AC
  Administered 2022-01-06: 1 via ORAL
  Filled 2022-01-06: qty 1

## 2022-01-06 MED ORDER — ONDANSETRON 4 MG PO TBDP
4.0000 mg | ORAL_TABLET | Freq: Once | ORAL | Status: AC
Start: 2022-01-06 — End: 2022-01-06
  Administered 2022-01-06: 4 mg via ORAL
  Filled 2022-01-06: qty 1

## 2022-01-06 MED ORDER — HYDROCODONE-ACETAMINOPHEN 7.5-325 MG PO TABS: 0.5000 | ORAL_TABLET | Freq: Four times a day (QID) | ORAL | 0 refills | Status: DC | PRN

## 2022-01-06 MED ORDER — ONDANSETRON 4 MG PO TBDP: 4.0000 mg | ORAL_TABLET | Freq: Three times a day (TID) | ORAL | 0 refills | Status: DC | PRN

## 2022-01-06 NOTE — ED Notes (Signed)
Shoulder sling applied to right arm.

## 2022-01-06 NOTE — Discharge Instructions (Signed)
Please wear sling at all times except for showering and changing close.  He may take Norco half to 1 tablet every 6 hours as needed for severe pain.  Call orthopedic office tomorrow to schedule follow-up appointment.

## 2022-01-06 NOTE — ED Notes (Signed)
Pt transported to Xray. 

## 2022-01-06 NOTE — ED Triage Notes (Signed)
Pt arrived via [facility] from home with reports of unwitnessed mechanical fall, pt was getting up from the bathroom and states her feet slipped and fell into the wall with her R shoulder. Pt states she also hit her head on the wall, no LOC. Pt does not take any blood thinners, took 7.5mg  of Meloxicam around 730pm.  Pt has tenderness and swelling to R shoulder. Pt had surgical procedure to R elbow in Dec 2022. Pt has brace in place from her surgery.  Pt is alert and oriented, no distress noted at this time.

## 2022-01-06 NOTE — ED Provider Notes (Signed)
[facility] EMERGENCY DEPARTMENT Provider Note   CSN: [id number] Arrival date & time: 01/06/22  2140     History  Chief Complaint  Patient presents with   Fall   Shoulder Pain    Lori Brady is a 76 y.o. female.  With history of Parkinson's presents to the emergency department for evaluation of a fall.  Patient fell into the wall from a mechanical fall just prior to arrival.  She states she was walking with her walker when she slipped and her right shoulder went into the wall.  She slid down to the ground but denies any pain or injury to her back hips knees or ankles.  She is in a custom orthotic splint for her right elbow from a volar distal radial plate and ulnar shaft plate that was placed on her right upper extremity in December 2022, she is still under the care of orthopedics for this.  She denies any increasing pain to the right forearm hand or elbow from the fall today.  She only complains of right proximal humerus pain.  No head injury LOC nausea or vomiting.  She has been ambulatory since the accident.  Pain is moderate  HPI     Home Medications Prior to Admission medications   Medication Sig Start Date End Date Taking? Authorizing Provider  HYDROcodone-acetaminophen (NORCO) 7.5-325 MG tablet Take 0.5-1 tablets by mouth every 6 (six) hours as needed for moderate pain. 01/06/22  Yes Evon Slack, PA-C  ondansetron (ZOFRAN-ODT) 4 MG disintegrating tablet Take 1 tablet (4 mg total) by mouth every 8 (eight) hours as needed for nausea or vomiting. 01/06/22  Yes Evon Slack, PA-C  acetaminophen (TYLENOL) 500 MG tablet Take 500 mg by mouth every 6 (six) hours as needed.  09/27/19   [provider]  albuterol (ACCUNEB) 0.63 MG/3ML nebulizer solution Take 1 ampule by nebulization every 6 (six) hours as needed for wheezing.    [provider]  albuterol (VENTOLIN HFA) 108 (90 Base) MCG/ACT inhaler Inhale 2 puffs into the lungs every 6 (six)  hours as needed for wheezing or shortness of breath. 06/04/21   Yevonne Pax, MD  alendronate (FOSAMAX) 70 MG tablet Take 1 tablet (70 mg total) by mouth once a week. Take with a full glass of water on an empty stomach. 12/18/21   Erasmo Downer, MD  aspirin 81 MG tablet Take 81 mg by mouth daily.    [provider]  atorvastatin (LIPITOR) 40 MG tablet Take 1 tablet (40 mg total) by mouth at bedtime. 09/10/21   Erasmo Downer, MD  azelastine (ASTELIN) 0.1 % nasal spray Place 2 sprays into both nostrils daily at 6 (six) AM. 09/05/20   [provider]  benzonatate (TESSALON) 100 MG capsule Take 1 capsule (100 mg total) by mouth 2 (two) times daily as needed for cough. 12/15/21   Bacigalupo, Marzella Schlein, MD  betamethasone valerate (VALISONE) 0.1 % cream APPLY TOPICALLY TO AFFECTED AREA SPARINGLY 2 TIMES DAILY. 07/10/15   [name], MD  BREO ELLIPTA 100-25 MCG/INH AEPB INHALE 1 PUFF BY MOUTH ONCE DAILY AS DIRECTED 06/23/21   Yevonne Pax, MD  carbidopa-levodopa (SINEMET CR) 50-200 MG tablet Take 1 tablet by mouth daily. Before dose 07/08/18   [provider]  carbidopa-levodopa (SINEMET IR) 25-100 MG tablet Take 2 tablets by mouth 3 (three) times daily.  12/13/17 01/23/21  [provider]  cetirizine (ZYRTEC) 10 MG tablet TAKE 1 TABLET BY  MOUTH ONCE DAILY 09/09/21   Yevonne Pax, MD  Cholecalciferol (VITAMIN D) 2000 UNITS CAPS Take 1 capsule by mouth daily.    [provider]  citalopram (CELEXA) 20 MG tablet TAKE 1 TABLET BY MOUTH ONCE DAILY 12/18/21   Erasmo Downer, MD  clobetasol (TEMOVATE) 0.05 % external solution Apply 1 application topically 2 (two) times daily. As needed    [provider]  clobetasol cream (TEMOVATE) 0.05 % Apply 1 application topically 2 (two) times daily. As needed    [provider]  doxepin (SINEQUAN) 25 MG capsule TAKE 1 CAPSULE BY MOUTH AT BEDTIME 09/23/21   Bacigalupo, Marzella Schlein, MD  entacapone  (COMTAN) 200 MG tablet Take by mouth. 01/24/21 04/24/21  [provider]  fluticasone (FLONASE) 50 MCG/ACT nasal spray PLACE 2 SPRAYS INTO BOTHS NOSTRILS DAILY 05/09/21   Erasmo Downer, MD  ketoconazole (NIZORAL) 2 % shampoo Apply 1 application topically 2 (two) times a week. 09/04/19   [provider]  meloxicam (MOBIC) 7.5 MG tablet TAKE 1 TABLET BY MOUTH ONCE DAILY 12/19/21   Erasmo Downer, MD  montelukast (SINGULAIR) 10 MG tablet TAKE 1 TABLET BY MOUTH ONCE DAILY 05/09/21   Erasmo Downer, MD  omeprazole (PRILOSEC) 40 MG capsule Take 1 capsule (40 mg total) by mouth daily. 09/10/21   Bacigalupo, Marzella Schlein, MD  potassium chloride SA (KLOR-CON) 20 MEQ tablet Take 1 tablet (20 mEq total) by mouth daily. 05/12/21   Erasmo Downer, MD  Tiotropium Bromide Monohydrate (SPIRIVA RESPIMAT) 2.5 MCG/ACT AERS INHALE 2 PUFFS BY MOUTH ONCE DAILY FOR COPD 10/20/21   Yevonne Pax, MD      Allergies    Levofloxacin and Vicodin [hydrocodone-acetaminophen]    Review of Systems   Review of Systems  Physical Exam Updated Vital Signs BP 137/79    Pulse 100    Temp 99.1 F (37.3 C) (Oral)    Resp 16    Ht 5\' 1"  (1.549 m)    Wt 61.2 kg    SpO2 96%    BMI 25.51 kg/m  Physical Exam Constitutional:      Appearance: She is well-developed.  HENT:     Head: Normocephalic and atraumatic.  Eyes:     Conjunctiva/sclera: Conjunctivae normal.  Cardiovascular:     Rate and Rhythm: Normal rate.  Pulmonary:     Effort: Pulmonary effort is normal. No respiratory distress.  Musculoskeletal:     Cervical back: Normal range of motion.     Comments: In tact custom splint to the right elbow with elbow held in flexion at 90 degrees.  No swelling throughout the hand.  Normal sensation throughout the right hand.  She is tender along the proximal humerus with mild swelling, no deformity.  Nontender throughout the clavicle.  No tenderness along the cervical thoracic or lumbar spine.  She  moves both lower extremities well with no pain or discomfort.  Skin:    General: Skin is warm.     Findings: No rash.  Neurological:     Mental Status: She is alert and oriented to person, place, and time.  Psychiatric:        Behavior: Behavior normal.        Thought Content: Thought content normal.    ED Results / Procedures / Treatments   Labs (all labs ordered are listed, but only abnormal results are displayed) Labs Reviewed - No data to display  EKG None  Radiology DG Shoulder Right  Result Date: 01/06/2022 CLINICAL DATA:  Pain with decreased range of motion. Unwitnessed fall. Slip and fall getting up in the bathroom. EXAM: RIGHT SHOULDER - 2+ VIEW COMPARISON:  None. FINDINGS: Comminuted mildly displaced proximal humerus fracture. Dominant fracture plane appears through the surgical neck. There is questionable nondisplaced extension into the glenohumeral joint. Normal acromioclavicular joint. Soft tissue edema seen. There are remote right rib fractures with callus. IMPRESSION: Comminuted mildly displaced proximal humerus fracture. There may be involvement of the glenohumeral joint. Electronically Signed   By: Narda Rutherford M.D.   On: 01/06/2022 22:11    Procedures Procedures   Medications Ordered in ED Medications  HYDROcodone-acetaminophen (NORCO/VICODIN) 5-325 MG per tablet 1 tablet (1 tablet Oral Given 01/06/22 2244)  ondansetron (ZOFRAN-ODT) disintegrating tablet 4 mg (4 mg Oral Given 01/06/22 2244)    ED Course/ Medical Decision Making/ A&P                           Medical Decision Making Amount and/or Complexity of Data Reviewed Radiology: ordered.  Risk Prescription drug management.  76 year old female with Parkinson's who was ambulatory with a walker just prior to arrival.  She slipped on a slipper and fell into the wall injuring her right proximal humerus.  X-ray showed and reviewed by me today a minimally displaced comminuted proximal humeral neck and head  fracture.  There is no dislocation.  She is placed into a sling and will follow-up with orthopedics.  Patient given Norco for pain.  Patient understands signs and symptoms return to the ER for.  Final Clinical Impression(s) / ED Diagnoses Final diagnoses:  Other closed displaced fracture of proximal end of right humerus, initial encounter    Rx / DC Orders ED Discharge Orders          Ordered    HYDROcodone-acetaminophen (NORCO) 7.5-325 MG tablet  Every 6 hours PRN        01/06/22 2251    ondansetron (ZOFRAN-ODT) 4 MG disintegrating tablet  Every 8 hours PRN        01/06/22 2251              Ronnette Juniper 01/06/22 2254    Willy Eddy, MD 01/06/22 2258

## 2022-01-09 ENCOUNTER — Other Ambulatory Visit: Payer: Self-pay | Admitting: Family Medicine

## 2022-01-09 DIAGNOSIS — S42201A Unspecified fracture of upper end of right humerus, initial encounter for closed fracture: Secondary | ICD-10-CM | POA: Diagnosis not present

## 2022-01-09 NOTE — Telephone Encounter (Signed)
Requested Prescriptions  Pending Prescriptions Disp Refills   omeprazole (PRILOSEC) 40 MG capsule [Pharmacy Med Name: OMEPRAZOLE DR 40 MG CAP] 90 capsule 0    Sig: TAKE 1 CAPSULE BY MOUTH ONCE DAILY     Gastroenterology: Proton Pump Inhibitors Passed - 01/09/2022  1:32 PM      Passed - Valid encounter within last 12 months    Recent Outpatient Visits          3 weeks ago Benign essential HTN   [place], Dionne Bucy, MD   11 months ago Encounter for annual physical exam   [facility] [place], Dionne Bucy, MD   1 year ago Benign essential HTN   [facility] [place], Dionne Bucy, MD   1 year ago Encounter for annual physical exam   [facility] [place], Dionne Bucy, MD   2 years ago Benign essential HTN   [facility] Bacigalupo, Dionne Bucy, MD      Future Appointments            In 3 weeks Bacigalupo, Dionne Bucy, MD [facility] [place]

## 2022-01-12 ENCOUNTER — Ambulatory Visit: Payer: Self-pay | Admitting: *Deleted

## 2022-01-12 NOTE — Telephone Encounter (Signed)
°  Chief Complaint: Wheezing at times Symptoms: Wheezing. States has brace from broken shoulder and "Cannot cough so is wheezing trying to get stuff up."  No audible wheezing during call. States inhalers help "Some" Frequency: Saturday Pertinent Negatives: Patient denies fever. Denies SOB. States "Just wheezing" Disposition: [] ED /[] Urgent Care (no appt availability in office) / [] Appointment(In office/virtual)/ []  [place] [facility]/ [] Home Care/ [] Refused Recommended Disposition /[] [place] [facility]/ [x]  Follow-up with PCP Additional Notes: Declines appt. States "CAnnot get in without lots of help and don't have any for days." Requesting "Something to help get this stuff up. Can't cough it up because of brace so limiting." Pt has Home Health. Advised EMS for worsening symptoms. Please advise       Reason for Disposition  [1] MILD difficulty breathing (e.g., minimal/no SOB at rest, SOB with walking, pulse <100) AND [2] NEW-onset or WORSE than normal    Denies SOB "Just wheezing"  Declines appt  Answer Assessment - Initial Assessment Questions 1. RESPIRATORY STATUS: "Describe your breathing?" (e.g., wheezing, shortness of breath, unable to speak, severe coughing)      SOB, wheezing 2. ONSET: "When did this breathing problem begin?"      weekend 3. PATTERN "Does the difficult breathing come and go, or has it been constant since it started?"      Intermittent 4. SEVERITY: "How bad is your breathing?" (e.g., mild, moderate, severe)    - MILD: No SOB at rest, mild SOB with walking, speaks normally in sentences, can lie down, no retractions, pulse < 100.    - MODERATE: SOB at rest, SOB with minimal exertion and prefers to sit, cannot lie down flat, speaks in phrases, mild retractions, audible wheezing, pulse 100-120.    - SEVERE: Very SOB at rest, speaks in single words, struggling to breathe, sitting hunched forward, retractions, pulse > 120      mild 5. RECURRENT SYMPTOM: "Have  you had difficulty breathing before?" If Yes, ask: "When was the last time?" and "What happened that time?"       6. CARDIAC HISTORY: "Do you have any history of heart disease?" (e.g., heart attack, angina, bypass surgery, angioplasty)       7. LUNG HISTORY: "Do you have any history of lung disease?"  (e.g., pulmonary embolus, asthma, emphysema)      8. CAUSE: "What do you think is causing the breathing problem?"      "Cannot move due to brace" 9. OTHER SYMPTOMS: "Do you have any other symptoms? (e.g., dizziness, runny nose, cough, chest pain, fever)     None 10. O2 SATURATION MONITOR:  "Do you use an oxygen saturation monitor (pulse oximeter) at home?" If Yes, "What is your reading (oxygen level) today?" "What is your usual oxygen saturation reading?" (e.g., 95%)       NA  Protocols used: Breathing Difficulty-A-AH

## 2022-01-13 ENCOUNTER — Other Ambulatory Visit: Payer: Self-pay | Admitting: Internal Medicine

## 2022-01-13 ENCOUNTER — Other Ambulatory Visit: Payer: Self-pay | Admitting: Family Medicine

## 2022-01-13 DIAGNOSIS — J301 Allergic rhinitis due to pollen: Secondary | ICD-10-CM

## 2022-01-13 DIAGNOSIS — J449 Chronic obstructive pulmonary disease, unspecified: Secondary | ICD-10-CM

## 2022-01-13 MED ORDER — BENZONATATE 100 MG PO CAPS: 100.0000 mg | ORAL_CAPSULE | Freq: Two times a day (BID) | ORAL | 0 refills | Status: DC | PRN

## 2022-01-13 NOTE — Telephone Encounter (Signed)
Ok to send in Valmy 100mg  BID prn #30 r0, but if continues to have difficulty with SOB/wheezing, they will need to get her looked at.

## 2022-01-13 NOTE — Telephone Encounter (Signed)
Mobic being delivered today. Requested Prescriptions  Pending Prescriptions Disp Refills   meloxicam (MOBIC) 7.5 MG tablet [Pharmacy Med Name: MELOXICAM 7.5 MG TAB] 30 tablet 2    Sig: TAKE 1 TABLET BY MOUTH ONCE DAILY     Analgesics:  COX2 Inhibitors Failed - 01/13/2022  9:59 AM      Failed - Manual Review: Labs are only required if the patient has taken medication for more than 8 weeks.      Failed - HGB in normal range and within 360 days    Hemoglobin  Date Value Ref Range Status  12/15/2021 10.5 (L) 11.1 - 15.9 g/dL Final         Failed - HCT in normal range and within 360 days    Hematocrit  Date Value Ref Range Status  12/15/2021 33.7 (L) 34.0 - 46.6 % Final         Passed - Cr in normal range and within 360 days    Creatinine  Date Value Ref Range Status  03/10/2015 0.79 mg/dL Final    Comment:    0.44-1.00 NOTE: New Reference Range  01/22/15    Creatinine, Ser  Date Value Ref Range Status  12/15/2021 0.64 0.57 - 1.00 mg/dL Final         Passed - AST in normal range and within 360 days    AST  Date Value Ref Range Status  12/15/2021 28 0 - 40 IU/L Final   SGOT(AST)  Date Value Ref Range Status  04/26/2014 27 15 - 37 Unit/L Final         Passed - ALT in normal range and within 360 days    ALT  Date Value Ref Range Status  12/15/2021 6 0 - 32 IU/L Final   SGPT (ALT)  Date Value Ref Range Status  04/26/2014 37 12 - 78 U/L Final         Passed - eGFR is 30 or above and within 360 days    EGFR (African American)  Date Value Ref Range Status  03/10/2015 >60  Final   GFR calc Af Amer  Date Value Ref Range Status  07/25/2020 83 >59 mL/min/1.73 Final    Comment:    **[facility] currently reports eGFR in compliance with the current**   recommendations of the [employer]. [facility] will   update reporting as new guidelines are published from the NKF-ASN   Task force.    EGFR (Non-African Amer.)  Date Value Ref Range Status  03/10/2015  >60  Final    Comment:    eGFR values <89m/min/1.73 m2 may be an indication of chronic kidney disease (CKD). Calculated eGFR is useful in patients with stable renal function. The eGFR calculation will not be reliable in acutely ill patients when serum creatinine is changing rapidly. It is not useful in patients on dialysis. The eGFR calculation may not be applicable to patients at the low and high extremes of body sizes, pregnant women, and vegetarians.    GFR calc non Af Amer  Date Value Ref Range Status  07/25/2020 72 >59 mL/min/1.73 Final   eGFR  Date Value Ref Range Status  12/15/2021 93 >59 mL/min/1.73 Final         Passed - Patient is not pregnant      Passed - Valid encounter within last 12 months    Recent Outpatient Visits          4 weeks ago Benign essential HTN   [facility]  Virginia Crews, MD   11 months ago Encounter for annual physical exam   [facility], Dionne Bucy, MD   1 year ago Benign essential HTN   [facility] [place], Dionne Bucy, MD   1 year ago Encounter for annual physical exam   [facility] [place], Dionne Bucy, MD   2 years ago Benign essential HTN   [facility] [place], Dionne Bucy, MD      Future Appointments            In 2 weeks Bacigal[place], Dionne Bucy, MD [facility]            doxepin (SINEQUAN) 25 MG capsule [Pharmacy Med Name: DOXEPIN HCL 25 MG CAP] 30 capsule 0    Sig: TAKE 1 CAPSULE BY MOUTH AT BEDTIME     Psychiatry:  Antidepressants - Heterocyclics (TCAs) Passed - 01/13/2022  9:59 AM      Passed - Valid encounter within last 6 months    Recent Outpatient Visits          4 weeks ago Benign essential HTN   [facility] [place], Dionne Bucy, MD   11 months ago Encounter for annual physical exam   [facility], Dionne Bucy, MD   1 year ago Benign essential HTN   [facility], Dionne Bucy, MD   1 year ago Encounter for annual physical exam   [facility], Dionne Bucy, MD   2 years ago Benign essential HTN   [place] Bacigalupo, Dionne Bucy, MD      Future Appointments            In 2 weeks Bacigalupo, Dionne Bucy, MD [facility]          Psychiatry:  Antidepressants - Heterocyclics (TCAs) - doxepin Passed - 01/13/2022  9:59 AM      Passed - Valid encounter within last 12 months    Recent Outpatient Visits          4 weeks ago Benign essential HTN   [facility] [place], Dionne Bucy, MD   11 months ago Encounter for annual physical exam   [facility], Dionne Bucy, MD   1 year ago Benign essential HTN   [facility] [place], Dionne Bucy, MD   1 year ago Encounter for annual physical exam   [facility] [place], Dionne Bucy, MD   2 years ago Benign essential HTN   [place] Bacigalupo, Dionne Bucy, MD      Future Appointments            In 2 weeks Bacigalupo, Dionne Bucy, MD [facility]            montelukast (SINGULAIR) 10 MG tablet [Pharmacy Med Name: MONTELUKAST SODIUM 10 MG TAB] 90 tablet 0    Sig: TAKE 1 TABLET BY MOUTH ONCE EVERY EVENING     Pulmonology:  Leukotriene Inhibitors Passed - 01/13/2022  9:59 AM      Passed - Valid encounter within last 12 months    Recent Outpatient Visits          4 weeks ago Benign essential HTN   [facility] [place], Dionne Bucy, MD   11 months ago Encounter for annual physical exam   [facility] [place], Dionne Bucy, MD   1 year ago Benign essential HTN   [place],  Dionne Bucy, MD   1 year ago Encounter for annual physical exam   [facility] [place], Dionne Bucy, MD   2 years ago Benign essential HTN   [facility] [place], Dionne Bucy, MD       Future Appointments            In 2 weeks Bacigalupo, Dionne Bucy, MD [facility] [place]

## 2022-01-13 NOTE — Telephone Encounter (Signed)
Advised patient as below. She reports she is not able to use her inhaler or nebulizer due to her arm being in a cast. Her sister reports that patient is "incapacitated" they are requesting tessalon pearls for cough to be called in for her. Patient reports that medication helped her last time she used it.

## 2022-01-13 NOTE — Telephone Encounter (Signed)
Is she using her albuterol inhaler or nebs?  She should if not.  She probably needs to be evaluated if this is not working.

## 2022-01-13 NOTE — Telephone Encounter (Signed)
Patient advised as below.  

## 2022-01-13 NOTE — Addendum Note (Signed)
Addended by: Hyacinth Meeker on: 01/13/2022 01:44 PM   Modules accepted: Orders

## 2022-01-15 DIAGNOSIS — R2689 Other abnormalities of gait and mobility: Secondary | ICD-10-CM | POA: Diagnosis not present

## 2022-01-15 DIAGNOSIS — R296 Repeated falls: Secondary | ICD-10-CM | POA: Diagnosis not present

## 2022-01-15 DIAGNOSIS — G458 Other transient cerebral ischemic attacks and related syndromes: Secondary | ICD-10-CM | POA: Diagnosis not present

## 2022-01-15 DIAGNOSIS — G20 Parkinson's disease: Secondary | ICD-10-CM | POA: Diagnosis not present

## 2022-01-16 DIAGNOSIS — I2510 Atherosclerotic heart disease of native coronary artery without angina pectoris: Secondary | ICD-10-CM | POA: Diagnosis not present

## 2022-01-16 DIAGNOSIS — G458 Other transient cerebral ischemic attacks and related syndromes: Secondary | ICD-10-CM | POA: Insufficient documentation

## 2022-01-19 DIAGNOSIS — S42201A Unspecified fracture of upper end of right humerus, initial encounter for closed fracture: Secondary | ICD-10-CM | POA: Diagnosis not present

## 2022-01-28 DIAGNOSIS — I959 Hypotension, unspecified: Secondary | ICD-10-CM | POA: Insufficient documentation

## 2022-01-29 ENCOUNTER — Telehealth: Payer: Self-pay

## 2022-01-29 ENCOUNTER — Ambulatory Visit (INDEPENDENT_AMBULATORY_CARE_PROVIDER_SITE_OTHER): Payer: Medicare PPO

## 2022-01-29 VITALS — Ht 61.0 in | Wt 135.0 lb

## 2022-01-29 DIAGNOSIS — Z1231 Encounter for screening mammogram for malignant neoplasm of breast: Secondary | ICD-10-CM

## 2022-01-29 DIAGNOSIS — Z0000 Encounter for general adult medical examination without abnormal findings: Secondary | ICD-10-CM | POA: Diagnosis not present

## 2022-01-29 DIAGNOSIS — Z1211 Encounter for screening for malignant neoplasm of colon: Secondary | ICD-10-CM

## 2022-01-29 NOTE — Patient Instructions (Signed)
Lori Brady , ?Thank you for taking time to come for your Medicare Wellness Visit. I appreciate your ongoing commitment to your health goals. Please review the following plan we discussed and let me know if I can assist you in the future.  ? ?Screening recommendations/referrals: ?Colonoscopy: 04/21/10, referral sent ?Mammogram: 06/17/16, referral sent ?Bone Density: 01/14/21 ?Recommended yearly ophthalmology/optometry visit for glaucoma screening and checkup ?Recommended yearly dental visit for hygiene and checkup ? ?Vaccinations: ?Influenza vaccine: 09/10/21 ?Pneumococcal vaccine: 01/10/15 ?Tdap vaccine: 04/01/17 ?Shingles vaccine: Zostavax 08/10/12   ?Covid-19:02/01/20, 02/27/20 ? ?Advanced directives: no ? ?Conditions/risks identified: none ? ?Next appointment: Follow up in one year for your annual wellness visit 02/03/23 @ 1pm by phone ? ? ?Preventive Care 74 Years and Older, Female ?Preventive care refers to lifestyle choices and visits with your health care provider that can promote health and wellness. ?What does preventive care include? ?A yearly physical exam. This is also called an annual well check. ?Dental exams once or twice a year. ?Routine eye exams. Ask your health care provider how often you should have your eyes checked. ?Personal lifestyle choices, including: ?Daily care of your teeth and gums. ?Regular physical activity. ?Eating a healthy diet. ?Avoiding tobacco and drug use. ?Limiting alcohol use. ?Practicing safe sex. ?Taking low-dose aspirin every day. ?Taking vitamin and mineral supplements as recommended by your health care provider. ?What happens during an annual well check? ?The services and screenings done by your health care provider during your annual well check will depend on your age, overall health, lifestyle risk factors, and family history of disease. ?Counseling  ?Your health care provider may ask you questions about your: ?Alcohol use. ?Tobacco use. ?Drug use. ?Emotional well-being. ?Home and  relationship well-being. ?Sexual activity. ?Eating habits. ?History of falls. ?Memory and ability to understand (cognition). ?Work and work Statistician. ?Reproductive health. ?Screening  ?You may have the following tests or measurements: ?Height, weight, and BMI. ?Blood pressure. ?Lipid and cholesterol levels. These may be checked every 5 years, or more frequently if you are over 77 years old. ?Skin check. ?Lung cancer screening. You may have this screening every year starting at age 30 if you have a 30-pack-year history of smoking and currently smoke or have quit within the past 15 years. ?Fecal occult blood test (FOBT) of the stool. You may have this test every year starting at age 74. ?Flexible sigmoidoscopy or colonoscopy. You may have a sigmoidoscopy every 5 years or a colonoscopy every 10 years starting at age 67. ?Hepatitis C blood test. ?Hepatitis B blood test. ?Sexually transmitted disease (STD) testing. ?Diabetes screening. This is done by checking your blood sugar (glucose) after you have not eaten for a while (fasting). You may have this done every 1-3 years. ?Bone density scan. This is done to screen for osteoporosis. You may have this done starting at age 54. ?Mammogram. This may be done every 1-2 years. Talk to your health care provider about how often you should have regular mammograms. ?Talk with your health care provider about your test results, treatment options, and if necessary, the need for more tests. ?Vaccines  ?Your health care provider may recommend certain vaccines, such as: ?Influenza vaccine. This is recommended every year. ?Tetanus, diphtheria, and acellular pertussis (Tdap, Td) vaccine. You may need a Td booster every 10 years. ?Zoster vaccine. You may need this after age 18. ?Pneumococcal 13-valent conjugate (PCV13) vaccine. One dose is recommended after age 1. ?Pneumococcal polysaccharide (PPSV23) vaccine. One dose is recommended after age 61. ?Talk to  your health care provider  about which screenings and vaccines you need and how often you need them. ?This information is not intended to replace advice given to you by your health care provider. Make sure you discuss any questions you have with your health care provider. ?Document Released: 11/29/2015 Document Revised: 07/22/2016 Document Reviewed: 09/03/2015 ?Elsevier Interactive Patient Education ? 2017 [place]. ? ?Fall Prevention in the Home ?Falls can cause injuries. They can happen to people of all ages. There are many things you can do to make your home safe and to help prevent falls. ?What can I do on the outside of my home? ?Regularly fix the edges of walkways and driveways and fix any cracks. ?Remove anything that might make you trip as you walk through a door, such as a raised step or threshold. ?Trim any bushes or trees on the path to your home. ?Use bright outdoor lighting. ?Clear any walking paths of anything that might make someone trip, such as rocks or tools. ?Regularly check to see if handrails are loose or broken. Make sure that both sides of any steps have handrails. ?Any raised decks and porches should have guardrails on the edges. ?Have any leaves, snow, or ice cleared regularly. ?Use sand or salt on walking paths during winter. ?Clean up any spills in your garage right away. This includes oil or grease spills. ?What can I do in the bathroom? ?Use night lights. ?Install grab bars by the toilet and in the tub and shower. Do not use towel bars as grab bars. ?Use non-skid mats or decals in the tub or shower. ?If you need to sit down in the shower, use a plastic, non-slip stool. ?Keep the floor dry. Clean up any water that spills on the floor as soon as it happens. ?Remove soap buildup in the tub or shower regularly. ?Attach bath mats securely with double-sided non-slip rug tape. ?Do not have throw rugs and other things on the floor that can make you trip. ?What can I do in the bedroom? ?Use night lights. ?Make sure  that you have a light by your bed that is easy to reach. ?Do not use any sheets or blankets that are too big for your bed. They should not hang down onto the floor. ?Have a firm chair that has side arms. You can use this for support while you get dressed. ?Do not have throw rugs and other things on the floor that can make you trip. ?What can I do in the kitchen? ?Clean up any spills right away. ?Avoid walking on wet floors. ?Keep items that you use a lot in easy-to-reach places. ?If you need to reach something above you, use a strong step stool that has a grab bar. ?Keep electrical cords out of the way. ?Do not use floor polish or wax that makes floors slippery. If you must use wax, use non-skid floor wax. ?Do not have throw rugs and other things on the floor that can make you trip. ?What can I do with my stairs? ?Do not leave any items on the stairs. ?Make sure that there are handrails on both sides of the stairs and use them. Fix handrails that are broken or loose. Make sure that handrails are as long as the stairways. ?Check any carpeting to make sure that it is firmly attached to the stairs. Fix any carpet that is loose or worn. ?Avoid having throw rugs at the top or bottom of the stairs. If you do have throw rugs, attach  them to the floor with carpet tape. ?Make sure that you have a light switch at the top of the stairs and the bottom of the stairs. If you do not have them, ask someone to add them for you. ?What else can I do to help prevent falls? ?Wear shoes that: ?Do not have high heels. ?Have rubber bottoms. ?Are comfortable and fit you well. ?Are closed at the toe. Do not wear sandals. ?If you use a stepladder: ?Make sure that it is fully opened. Do not climb a closed stepladder. ?Make sure that both sides of the stepladder are locked into place. ?Ask someone to hold it for you, if possible. ?Clearly mark and make sure that you can see: ?Any grab bars or handrails. ?First and last steps. ?Where the edge of  each step is. ?Use tools that help you move around (mobility aids) if they are needed. These include: ?Canes. ?Walkers. ?Scooters. ?Crutches. ?Turn on the lights when you go into a dark area. Replace any li

## 2022-01-29 NOTE — Progress Notes (Signed)
?Virtual Visit via Telephone Note ? ?I connected with  Lori Brady on 01/29/22 at  1:00 PM EDT by telephone and verified that I am speaking with the correct person using two identifiers. ? ?Location: ?Patient: home ?Provider: [facility] ?Persons participating in the virtual visit: patient/Nurse Health Advisor ?  ?I discussed the limitations, risks, security and privacy concerns of performing an evaluation and management service by telephone and the availability of in person appointments. The patient expressed understanding and agreed to proceed. ? ?Interactive audio and video telecommunications were attempted between this nurse and patient, however failed, due to patient having technical difficulties OR patient did not have access to video capability.  We continued and completed visit with audio only. ? ?Some vital signs may be absent or patient reported.  ? ?Dionisio David, LPN ? ? ?Subjective:  ? Lori Brady is a 76 y.o. female who presents for Medicare Annual (Subsequent) preventive examination. ? ?Review of Systems    ? ?  ? ?   ?Objective:  ?  ?Today's Vitals  ? 01/29/22 1303  ?Weight: 135 lb (61.2 kg)  ?Height: 5' 1"  (1.549 m)  ? ?Body mass index is 25.51 kg/m?. ? ?Advanced Directives 01/06/2022 01/23/2021 01/22/2020 10/25/2019 09/21/2019 01/20/2019 07/16/2018  ?Does Patient Have a Medical Advance Directive? No No No No No No No  ?Would patient like information on creating a medical advance directive? Yes (ED - Information included in AVS) No - Patient declined No - Patient declined No - Patient declined - No - Patient declined No - Patient declined  ? ? ?Current Medications (verified) ?Outpatient Encounter Medications as of 01/29/2022  ?Medication Sig  ? acetaminophen (TYLENOL) 500 MG tablet Take 500 mg by mouth every 6 (six) hours as needed.   ? albuterol (ACCUNEB) 0.63 MG/3ML nebulizer solution Take 1 ampule by nebulization every 6 (six) hours as needed for wheezing.  ? albuterol (VENTOLIN HFA) 108 (90 Base) MCG/ACT  inhaler INHALE 2 PUFFS INTO THE LUNGS EVERY 6 HOURS AS NEEDED FOR WHEEZING OR SHORTNESS OF BREATH  ? alendronate (FOSAMAX) 70 MG tablet Take 1 tablet (70 mg total) by mouth once a week. Take with a full glass of water on an empty stomach.  ? aspirin 81 MG EC tablet Take by mouth.  ? aspirin 81 MG tablet Take 81 mg by mouth daily.  ? atorvastatin (LIPITOR) 40 MG tablet Take 1 tablet (40 mg total) by mouth at bedtime.  ? azelastine (ASTELIN) 0.1 % nasal spray Place 2 sprays into both nostrils daily at 6 (six) AM.  ? benzonatate (TESSALON) 100 MG capsule Take 1 capsule (100 mg total) by mouth 2 (two) times daily as needed for cough.  ? betamethasone valerate (VALISONE) 0.1 % cream APPLY TOPICALLY TO AFFECTED AREA SPARINGLY 2 TIMES DAILY.  ? BREO ELLIPTA 100-25 MCG/ACT AEPB INHALE 1 PUFF BY MOUTH ONCE DAILY AS DIRECTED  ? carbidopa-levodopa (SINEMET CR) 50-200 MG tablet Take 1 tablet by mouth daily. Before dose  ? carbidopa-levodopa (SINEMET CR) 50-200 MG tablet Take 1 tablet by mouth at bedtime.  ? carbidopa-levodopa (SINEMET IR) 25-100 MG tablet 3 pill in the morning, then 2 pills two times a day  ? cetirizine (ZYRTEC) 10 MG tablet TAKE 1 TABLET BY MOUTH ONCE DAILY  ? cetirizine (ZYRTEC) 10 MG tablet Take by mouth.  ? Cholecalciferol (VITAMIN D) 2000 UNITS CAPS Take 1 capsule by mouth daily.  ? citalopram (CELEXA) 20 MG tablet TAKE 1 TABLET BY MOUTH ONCE DAILY  ?  clobetasol (TEMOVATE) 0.05 % external solution Apply 1 application topically 2 (two) times daily. As needed  ? clobetasol cream (TEMOVATE) 6.06 % Apply 1 application topically 2 (two) times daily. As needed  ? doxepin (SINEQUAN) 25 MG capsule TAKE 1 CAPSULE BY MOUTH AT BEDTIME  ? doxepin (SINEQUAN) 25 MG capsule Take by mouth.  ? fluticasone (FLONASE) 50 MCG/ACT nasal spray PLACE 2 SPRAYS INTO BOTHS NOSTRILS DAILY  ? fluticasone (FLONASE) 50 MCG/ACT nasal spray 1 spray once daily as needed  ? HYDROcodone-acetaminophen (NORCO) 7.5-325 MG tablet Take 0.5-1  tablets by mouth every 6 (six) hours as needed for moderate pain.  ? ketoconazole (NIZORAL) 2 % shampoo Apply 1 application topically 2 (two) times a week.  ? melatonin 5 MG TABS Take 1 tablet by mouth at bedtime.  ? meloxicam (MOBIC) 7.5 MG tablet TAKE 1 TABLET BY MOUTH ONCE DAILY  ? Meloxicam 10 MG CAPS meloxicam  ? montelukast (SINGULAIR) 10 MG tablet TAKE 1 TABLET BY MOUTH ONCE EVERY EVENING  ? omeprazole (PRILOSEC) 40 MG capsule TAKE 1 CAPSULE BY MOUTH ONCE DAILY  ? ondansetron (ZOFRAN) 4 MG tablet Take by mouth.  ? ondansetron (ZOFRAN-ODT) 4 MG disintegrating tablet Take 1 tablet (4 mg total) by mouth every 8 (eight) hours as needed for nausea or vomiting.  ? oxyCODONE (OXY IR/ROXICODONE) 5 MG immediate release tablet oxycodone 5 mg tablet ? Take 1 tablet every 4 hours by oral route.  ? oxyCODONE (OXY IR/ROXICODONE) 5 MG immediate release tablet Take by mouth.  ? oxyCODONE-acetaminophen (PERCOCET/ROXICET) 5-325 MG tablet Take by mouth.  ? potassium chloride SA (KLOR-CON) 20 MEQ tablet Take 1 tablet (20 mEq total) by mouth daily.  ? simvastatin (ZOCOR) 40 MG tablet Take 40 mg by mouth at bedtime.  ? Tiotropium Bromide Monohydrate (SPIRIVA RESPIMAT) 2.5 MCG/ACT AERS INHALE 2 PUFFS BY MOUTH ONCE DAILY FOR COPD  ? Tiotropium Bromide Monohydrate (SPIRIVA RESPIMAT) 2.5 MCG/ACT AERS 2 inhalations once daily.  ? traMADol (ULTRAM) 50 MG tablet tramadol 50 mg tablet ? Take 1 tablet every 8 hours by oral route.  ? carbidopa-levodopa (SINEMET IR) 25-100 MG tablet Take 2 tablets by mouth 3 (three) times daily.   ? entacapone (COMTAN) 200 MG tablet Take by mouth.  ? ?No facility-administered encounter medications on file as of 01/29/2022.  ? ? ?Allergies (verified) ?Levofloxacin and Vicodin [hydrocodone-acetaminophen]  ? ?History: ?Past Medical History:  ?Diagnosis Date  ? Arthritis   ? Asthma   ? Crohn's disease ([place])   ? Emphysema of lung ([place])   ? GERD (gastroesophageal reflux disease)   ? Hip fracture ([place]) 01/26/2017  ?  History of chicken pox   ? History of heart attack   ? Hx of completed stroke   ? Hypercholesteremia   ? Hypertension   ? Parkinson disease ([place])   ? Pneumonia   ? Seasonal allergies   ? Sepsis ([place])   ? Septic shock ([place]) 10/08/2016  ? UTI (lower urinary tract infection)   ? ?Past Surgical History:  ?Procedure Laterality Date  ? BREAST BIOPSY Bilateral 1960's to 90's  ? breast cysts    ? CATARACT EXTRACTION, BILATERAL    ? ELBOW SURGERY    ? right  ? right wrist fracture    ? TUBAL LIGATION    ? WRIST SURGERY Left   ? ?Family History  ?Problem Relation Age of Onset  ? Cancer Sister   ?     ovarian/uterine/breast  ? Breast cancer Sister 61  ? Cancer  Sister   ?     breast  ? Heart disease Sister   ? Breast cancer Sister 28  ? Heart disease Father   ? Cancer Father   ?     liver  ? Pancreatic cancer Mother   ? Heart disease Brother   ? Heart attack Brother   ? Cancer Brother   ?     liver  ? Heart disease Sister   ? Diabetes Maternal Grandmother   ? ?Social History  ? ?Socioeconomic History  ? Marital status: Widowed  ?  Spouse name: Not on file  ? Number of children: 3  ? Years of education: Not on file  ? Highest education level: Some college, no degree  ?Occupational History  ? Occupation: retired  ?Tobacco Use  ? Smoking status: Former  ?  Packs/day: 1.00  ?  Years: 30.00  ?  Pack years: 30.00  ?  Types: Cigarettes  ?  Quit date: 08/01/2008  ?  Years since quitting: 13.5  ? Smokeless tobacco: Never  ?Vaping Use  ? Vaping Use: Never used  ?Substance and Sexual Activity  ? Alcohol use: Yes  ?  Alcohol/week: 1.0 standard drink  ?  Types: 1 Shots of liquor per week  ? Drug use: No  ? Sexual activity: Not on file  ?Other Topics Concern  ? Not on file  ?Social History Narrative  ? Not on file  ? ?Social Determinants of Health  ? ?Financial Resource Strain: Not on file  ?Food Insecurity: Not on file  ?Transportation Needs: Not on file  ?Physical Activity: Insufficiently Active  ? Days of Exercise per Week: 3 days  ?  Minutes of Exercise per Session: 20 min  ?Stress: No Stress Concern Present  ? Feeling of Stress : Not at all  ?Social Connections: Not on file  ? ? ?Tobacco Counseling ?Counseling given: Not Answered ? ? ?Clin

## 2022-01-29 NOTE — Telephone Encounter (Signed)
CALLED PATIENT NO ANSWER LEFT VOICEMAIL FOR A CALL BACK ? ?

## 2022-01-30 ENCOUNTER — Telehealth: Payer: Self-pay

## 2022-01-30 DIAGNOSIS — W1839XA Other fall on same level, initial encounter: Secondary | ICD-10-CM | POA: Diagnosis not present

## 2022-01-30 DIAGNOSIS — S52001A Unspecified fracture of upper end of right ulna, initial encounter for closed fracture: Secondary | ICD-10-CM | POA: Diagnosis not present

## 2022-01-30 NOTE — Telephone Encounter (Signed)
CALLED PATIENT NO ANSWER LEFT VOICEMAIL FOR A CALL BACK °Letter sent °

## 2022-02-02 ENCOUNTER — Encounter: Payer: Self-pay | Admitting: Family Medicine

## 2022-02-10 ENCOUNTER — Ambulatory Visit (INDEPENDENT_AMBULATORY_CARE_PROVIDER_SITE_OTHER): Payer: Medicare PPO | Admitting: Nurse Practitioner

## 2022-02-10 ENCOUNTER — Other Ambulatory Visit: Payer: Self-pay

## 2022-02-10 ENCOUNTER — Encounter (INDEPENDENT_AMBULATORY_CARE_PROVIDER_SITE_OTHER): Payer: Self-pay | Admitting: Nurse Practitioner

## 2022-02-10 VITALS — BP 115/81 | HR 106 | Resp 16

## 2022-02-10 DIAGNOSIS — J449 Chronic obstructive pulmonary disease, unspecified: Secondary | ICD-10-CM | POA: Diagnosis not present

## 2022-02-10 DIAGNOSIS — G458 Other transient cerebral ischemic attacks and related syndromes: Secondary | ICD-10-CM | POA: Diagnosis not present

## 2022-02-16 ENCOUNTER — Telehealth: Payer: Self-pay

## 2022-02-16 NOTE — Telephone Encounter (Signed)
Patient left a voicemail and is ready to schedule colonoscopy  ?

## 2022-02-17 ENCOUNTER — Telehealth: Payer: Self-pay

## 2022-02-17 NOTE — Telephone Encounter (Signed)
CALLED PATIENT NO ANSWER LEFT VOICEMAIL FOR A CALL BACK ? ?

## 2022-02-22 ENCOUNTER — Encounter (INDEPENDENT_AMBULATORY_CARE_PROVIDER_SITE_OTHER): Payer: Self-pay | Admitting: Nurse Practitioner

## 2022-02-22 NOTE — Progress Notes (Signed)
? ?Subjective:  ? ? Patient ID: Lori Brady, female    DOB: 1946/04/12, 76 y.o.   MRN: [id number] ?Chief Complaint  ?Patient presents with  ? Establish Care  ?  Referred by Dr Nehemiah Massed  ? ? ?Lori Brady is a 76 year old female that presents today for evaluation of left subclavian steal syndrome.  This was detected on CT scan on 12/16/2021.  She underwent a CT angio of the head and neck following some numbness of the right fifth finger and weakness after neck surgery.  It was detected that the patient had evidence of a short segment occlusion of the left subclavian artery just proximal to the left vertebral artery.  This was also found on carotid duplex done 11/04/2021.  The patient currently denies any numbness or tingling in her left upper extremity.  She denies any cold sensation or discoloration of the left upper extremity.  She also denies any dizziness. ? ? ?Review of Systems  ?Neurological:  Negative for dizziness.  ?All other systems reviewed and are negative. ? ?   ?Objective:  ? Physical Exam ?Vitals reviewed.  ?HENT:  ?   Head: Normocephalic.  ?Cardiovascular:  ?   Rate and Rhythm: Normal rate.  ?   Pulses:     ?     Radial pulses are 1+ on the right side and 1+ on the left side.  ?Pulmonary:  ?   Effort: Pulmonary effort is normal.  ?Skin: ?   General: Skin is warm and dry.  ?Neurological:  ?   Mental Status: She is alert and oriented to person, place, and time.  ?Psychiatric:     ?   Mood and Affect: Mood normal.     ?   Behavior: Behavior normal.     ?   Thought Content: Thought content normal.     ?   Judgment: Judgment normal.  ? ? ?BP 115/81 (BP Location: Right Arm)   Pulse (!) 106   Resp 16  ? ?Past Medical History:  ?Diagnosis Date  ? Arthritis   ? Asthma   ? Crohn's disease ([place])   ? Emphysema of lung ([place])   ? GERD (gastroesophageal reflux disease)   ? Hip fracture ([place]) 01/26/2017  ? History of chicken pox   ? History of heart attack   ? Hx of completed stroke   ? Hypercholesteremia   ?  Hypertension   ? Parkinson disease ([place])   ? Pneumonia   ? Seasonal allergies   ? Sepsis ([place])   ? Septic shock ([place]) 10/08/2016  ? UTI (lower urinary tract infection)   ? ? ?Social History  ? ?Socioeconomic History  ? Marital status: Widowed  ?  Spouse name: Not on file  ? Number of children: 3  ? Years of education: Not on file  ? Highest education level: Some college, no degree  ?Occupational History  ? Occupation: retired  ?Tobacco Use  ? Smoking status: Former  ?  Packs/day: 1.00  ?  Years: 30.00  ?  Pack years: 30.00  ?  Types: Cigarettes  ?  Quit date: 08/01/2008  ?  Years since quitting: 13.5  ? Smokeless tobacco: Never  ?Vaping Use  ? Vaping Use: Never used  ?Substance and Sexual Activity  ? Alcohol use: Yes  ?  Alcohol/week: 1.0 standard drink  ?  Types: 1 Shots of liquor per week  ? Drug use: No  ? Sexual activity: Not on file  ?Other Topics Concern  ?  Not on file  ?Social History Narrative  ? Not on file  ? ?Social Determinants of Health  ? ?Financial Resource Strain: Low Risk   ? Difficulty of Paying Living Expenses: Not hard at all  ?Food Insecurity: No Food Insecurity  ? Worried About Charity fundraiser in the Last Year: Never true  ? Ran Out of Food in the Last Year: Never true  ?Transportation Needs: No Transportation Needs  ? Lack of Transportation (Medical): No  ? Lack of Transportation (Non-Medical): No  ?Physical Activity: Insufficiently Active  ? Days of Exercise per Week: 3 days  ? Minutes of Exercise per Session: 20 min  ?Stress: No Stress Concern Present  ? Feeling of Stress : Not at all  ?Social Connections: Socially Isolated  ? Frequency of Communication with Friends and Family: More than three times a week  ? Frequency of Social Gatherings with Friends and Family: Twice a week  ? Attends Religious Services: Never  ? Active Member of Clubs or Organizations: No  ? Attends Archivist Meetings: Never  ? Marital Status: Widowed  ?Intimate Partner Violence: Not At Risk  ? Fear of  Current or Ex-Partner: No  ? Emotionally Abused: No  ? Physically Abused: No  ? Sexually Abused: No  ? ? ?Past Surgical History:  ?Procedure Laterality Date  ? BREAST BIOPSY Bilateral 1960's to 90's  ? breast cysts    ? CATARACT EXTRACTION, BILATERAL    ? ELBOW SURGERY    ? right  ? right wrist fracture    ? TUBAL LIGATION    ? WRIST SURGERY Left   ? ? ?Family History  ?Problem Relation Age of Onset  ? Cancer Sister   ?     ovarian/uterine/breast  ? Breast cancer Sister 59  ? Cancer Sister   ?     breast  ? Heart disease Sister   ? Breast cancer Sister 105  ? Heart disease Father   ? Cancer Father   ?     liver  ? Pancreatic cancer Mother   ? Heart disease Brother   ? Heart attack Brother   ? Cancer Brother   ?     liver  ? Heart disease Sister   ? Diabetes Maternal Grandmother   ? ? ?Allergies  ?Allergen Reactions  ? Levofloxacin Nausea Only and Nausea And Vomiting  ?  Can take if given something for n/v  ? Vicodin [Hydrocodone-Acetaminophen] Nausea Only  ? ? ? ?  Latest Ref Rng & Units 12/15/2021  ?  2:34 PM 01/28/2021  ?  1:05 PM 07/25/2020  ?  3:32 PM  ?CBC  ?WBC 3.4 - 10.8 x10E3/uL 10.6   11.8   8.2    ?Hemoglobin 11.1 - 15.9 g/dL 10.5   10.1   10.5    ?Hematocrit 34.0 - 46.6 % 33.7   33.2   33.1    ?Platelets 150 - 450 x10E3/uL 234   215   194    ? ? ? ? ?CMP  ?   ?Component Value Date/Time  ? NA 139 12/15/2021 1434  ? NA 136 03/10/2015 0535  ? K 4.9 12/15/2021 1434  ? K 3.6 03/10/2015 0535  ? CL 101 12/15/2021 1434  ? CL 103 03/10/2015 0535  ? CO2 24 12/15/2021 1434  ? CO2 26 03/10/2015 0535  ? GLUCOSE 105 (H) 12/15/2021 1434  ? GLUCOSE 155 (H) 07/18/2018 0526  ? GLUCOSE 117 (H) 03/10/2015 0535  ? BUN 14  12/15/2021 1434  ? BUN 13 03/10/2015 0535  ? CREATININE 0.64 12/15/2021 1434  ? CREATININE 0.79 03/10/2015 0535  ? CALCIUM 9.7 12/15/2021 1434  ? CALCIUM 8.6 (L) 03/10/2015 0535  ? PROT 7.0 12/15/2021 1434  ? PROT 7.5 04/26/2014 1658  ? ALBUMIN 4.5 12/15/2021 1434  ? ALBUMIN 3.6 04/26/2014 1658  ? AST 28  12/15/2021 1434  ? AST 27 04/26/2014 1658  ? ALT 6 12/15/2021 1434  ? ALT 37 04/26/2014 1658  ? ALKPHOS 153 (H) 12/15/2021 1434  ? ALKPHOS 76 04/26/2014 1658  ? BILITOT 0.5 12/15/2021 1434  ? BILITOT 0.7 04/26/2014 1658  ? GFRNONAA 72 07/25/2020 1532  ? GFRNONAA >60 03/10/2015 0535  ? GFRAA 83 07/25/2020 1532  ? GFRAA >60 03/10/2015 0535  ? ? ? ?No results found. ? ?   ?Assessment & Plan:  ? ?1. Subclavian steal syndrome of left subclavian artery ?Currently the patient is asymptomatic in regards to her subclavian steal syndrome.  We will have the patient return in 6 months for reevaluation or sooner if she begins to develop symptoms such as numbness tingling or pain in her left upper extremity with use or if she begins to experience significant dizziness. ? ?2. COPD (chronic obstructive pulmonary disease) with chronic bronchitis ([place]) ?Continue pulmonary medications and aerosols as already ordered, these medications have been reviewed and there are no changes at this time. ?  ? ? ?Current Outpatient Medications on File Prior to Visit  ?Medication Sig Dispense Refill  ? acetaminophen (TYLENOL) 500 MG tablet Take 500 mg by mouth every 6 (six) hours as needed.     ? albuterol (ACCUNEB) 0.63 MG/3ML nebulizer solution Take 1 ampule by nebulization every 6 (six) hours as needed for wheezing.    ? albuterol (VENTOLIN HFA) 108 (90 Base) MCG/ACT inhaler INHALE 2 PUFFS INTO THE LUNGS EVERY 6 HOURS AS NEEDED FOR WHEEZING OR SHORTNESS OF BREATH 8.5 g 3  ? alendronate (FOSAMAX) 70 MG tablet Take 1 tablet (70 mg total) by mouth once a week. Take with a full glass of water on an empty stomach. 12 tablet 3  ? aspirin 81 MG EC tablet Take by mouth.    ? atorvastatin (LIPITOR) 40 MG tablet Take 1 tablet (40 mg total) by mouth at bedtime. 90 tablet 0  ? azelastine (ASTELIN) 0.1 % nasal spray Place 2 sprays into both nostrils daily at 6 (six) AM.    ? betamethasone valerate (VALISONE) 0.1 % cream APPLY TOPICALLY TO AFFECTED AREA  SPARINGLY 2 TIMES DAILY. 15 g 5  ? BREO ELLIPTA 100-25 MCG/ACT AEPB INHALE 1 PUFF BY MOUTH ONCE DAILY AS DIRECTED 180 each 1  ? carbidopa-levodopa (SINEMET CR) 50-200 MG tablet Take 1 tablet by mouth at bedtime.    ? carbidopa-l

## 2022-02-24 ENCOUNTER — Other Ambulatory Visit: Payer: Self-pay | Admitting: Family Medicine

## 2022-02-25 ENCOUNTER — Telehealth: Payer: Self-pay

## 2022-02-25 NOTE — Telephone Encounter (Signed)
PA for CETIRIZINE 10 mg was denied and Lori Brady called pt's pharmacy and they advised that patient pays out of pocket for the medication at $14 and some change.  I called Pt and also spoke to pt's sister Lori Brady and pt did remember that she thinks she does pay for the medication ?

## 2022-03-04 DIAGNOSIS — M25511 Pain in right shoulder: Secondary | ICD-10-CM | POA: Diagnosis not present

## 2022-03-17 ENCOUNTER — Other Ambulatory Visit: Payer: Self-pay | Admitting: Family Medicine

## 2022-03-17 DIAGNOSIS — F419 Anxiety disorder, unspecified: Secondary | ICD-10-CM

## 2022-03-17 NOTE — Telephone Encounter (Signed)
Requested Prescriptions  ?Pending Prescriptions Disp Refills  ?? citalopram (CELEXA) 20 MG tablet [Pharmacy Med Name: CITALOPRAM HYDROBROMIDE 20 MG TAB] 90 tablet 0  ?  Sig: TAKE 1 TABLET BY MOUTH ONCE DAILY  ?  ? Psychiatry:  Antidepressants - SSRI Passed - 03/17/2022 12:42 PM  ?  ?  Passed - Valid encounter within last 6 months  ?  Recent Outpatient Visits   ?      ? 3 months ago Benign essential HTN  ? [facility] [place], Dionne Bucy, MD  ? 1 year ago Encounter for annual physical exam  ? [facility] [place], Dionne Bucy, MD  ? 1 year ago Benign essential HTN  ? [facility] Bacigal[place], Dionne Bucy, MD  ? 2 years ago Encounter for annual physical exam  ? [facility] [place], Dionne Bucy, MD  ? 2 years ago Benign essential HTN  ? [facility] [place], Dionne Bucy, MD  ?  ?  ?Future Appointments   ?        ? In 4 weeks Bacigalupo, Dionne Bucy, MD [facility]  ?  ? ?  ?  ?  ? ?

## 2022-03-30 ENCOUNTER — Encounter: Payer: Self-pay | Admitting: Internal Medicine

## 2022-03-30 ENCOUNTER — Ambulatory Visit: Payer: Medicare PPO | Admitting: Internal Medicine

## 2022-03-30 VITALS — BP 113/83 | HR 102 | Temp 98.6°F | Resp 16 | Ht 61.0 in | Wt 131.8 lb

## 2022-03-30 DIAGNOSIS — J449 Chronic obstructive pulmonary disease, unspecified: Secondary | ICD-10-CM

## 2022-03-30 DIAGNOSIS — G20 Parkinson's disease: Secondary | ICD-10-CM

## 2022-03-30 DIAGNOSIS — R0602 Shortness of breath: Secondary | ICD-10-CM

## 2022-03-30 DIAGNOSIS — K219 Gastro-esophageal reflux disease without esophagitis: Secondary | ICD-10-CM | POA: Diagnosis not present

## 2022-03-30 NOTE — Progress Notes (Signed)
[facility] [place], [place] [address]  Pulmonary Sleep Medicine   Office Visit Note  Patient Name: Lori Brady DOB: 1946-01-17 MRN [id number]  Date of Service: 03/30/2022  Complaints/HPI: COPD. She has been doing well no major issues at this time. Parkinsons being seen by Neuro. Remains at risk for aspiration due to swallowing issues.  As far as overall breathing is concerned she is doing okay.  She does have some cough noted.  She denies any chest pain no palpitations still has some shortness of breath with exertion.  ROS  General: (-) fever, (-) chills, (-) night sweats, (-) weakness Skin: (-) rashes, (-) itching,. Eyes: (-) visual changes, (-) redness, (-) itching. Nose and Sinuses: (-) nasal stuffiness or itchiness, (-) postnasal drip, (-) nosebleeds, (-) sinus trouble. Mouth and Throat: (-) sore throat, (-) hoarseness. Neck: (-) swollen glands, (-) enlarged thyroid, (-) neck pain. Respiratory: + cough, (-) bloody sputum, -- shortness of breath, - wheezing. Cardiovascular: - ankle swelling, (-) chest pain. Lymphatic: (-) lymph node enlargement. Neurologic: (-) numbness, (-) tingling. Psychiatric: (-) anxiety, (-) depression   Current Medication: Outpatient Encounter Medications as of 03/30/2022  Medication Sig Note   acetaminophen (TYLENOL) 500 MG tablet Take 500 mg by mouth every 6 (six) hours as needed.     albuterol (ACCUNEB) 0.63 MG/3ML nebulizer solution Take 1 ampule by nebulization every 6 (six) hours as needed for wheezing.    albuterol (VENTOLIN HFA) 108 (90 Base) MCG/ACT inhaler INHALE 2 PUFFS INTO THE LUNGS EVERY 6 HOURS AS NEEDED FOR WHEEZING OR SHORTNESS OF BREATH    alendronate (FOSAMAX) 70 MG tablet Take 1 tablet (70 mg total) by mouth once a week. Take with a full glass of water on an empty stomach.    aspirin 81 MG EC tablet Take by mouth.    atorvastatin (LIPITOR) 40 MG tablet Take 1 tablet (40 mg total) by mouth at bedtime.     azelastine (ASTELIN) 0.1 % nasal spray Place 2 sprays into both nostrils daily at 6 (six) AM.    betamethasone valerate (VALISONE) 0.1 % cream APPLY TOPICALLY TO AFFECTED AREA SPARINGLY 2 TIMES DAILY. 01/23/2021: As needed   BREO ELLIPTA 100-25 MCG/ACT AEPB INHALE 1 PUFF BY MOUTH ONCE DAILY AS DIRECTED    carbidopa-levodopa (SINEMET CR) 50-200 MG tablet Take 1 tablet by mouth daily. Before dose    carbidopa-levodopa (SINEMET CR) 50-200 MG tablet Take 1 tablet by mouth at bedtime.    carbidopa-levodopa (SINEMET IR) 25-100 MG tablet 3 pill in the morning, then 2 pills two times a day    cetirizine (ZYRTEC) 10 MG tablet TAKE 1 TABLET BY MOUTH ONCE DAILY    Cholecalciferol (VITAMIN D) 2000 UNITS CAPS Take 1 capsule by mouth daily.    citalopram (CELEXA) 20 MG tablet TAKE 1 TABLET BY MOUTH ONCE DAILY    clobetasol (TEMOVATE) 0.05 % external solution Apply 1 application topically 2 (two) times daily. As needed    clobetasol cream (TEMOVATE) 1.76 % Apply 1 application topically 2 (two) times daily. As needed    doxepin (SINEQUAN) 25 MG capsule TAKE 1 CAPSULE BY MOUTH AT BEDTIME    fluticasone (FLONASE) 50 MCG/ACT nasal spray     HYDROcodone-acetaminophen (NORCO) 7.5-325 MG tablet Take 0.5-1 tablets by mouth every 6 (six) hours as needed for moderate pain.    ketoconazole (NIZORAL) 2 % shampoo Apply 1 application. topically 2 (two) times a week.    melatonin 5 MG TABS Take 1  tablet by mouth at bedtime.    meloxicam (MOBIC) 7.5 MG tablet TAKE 1 TABLET BY MOUTH ONCE DAILY    Meloxicam 10 MG CAPS     montelukast (SINGULAIR) 10 MG tablet TAKE 1 TABLET BY MOUTH ONCE EVERY EVENING    omeprazole (PRILOSEC) 40 MG capsule TAKE 1 CAPSULE BY MOUTH ONCE DAILY    ondansetron (ZOFRAN-ODT) 4 MG disintegrating tablet Take 1 tablet (4 mg total) by mouth every 8 (eight) hours as needed for nausea or vomiting.    Tiotropium Bromide Monohydrate (SPIRIVA RESPIMAT) 2.5 MCG/ACT AERS INHALE 2 PUFFS BY MOUTH ONCE DAILY FOR COPD     traMADol (ULTRAM) 50 MG tablet     No facility-administered encounter medications on file as of 03/30/2022.    Surgical History: Past Surgical History:  Procedure Laterality Date   BREAST BIOPSY Bilateral 1960's to 90's   breast cysts     CATARACT EXTRACTION, BILATERAL     ELBOW SURGERY     right   right wrist fracture     TUBAL LIGATION     WRIST SURGERY Left     Medical History: Past Medical History:  Diagnosis Date   Arthritis    Asthma    Crohn's disease ([place])    Emphysema of lung ([facility])    GERD (gastroesophageal reflux disease)    Hip fracture ([place]) 01/26/2017   History of chicken pox    History of heart attack    Hx of completed stroke    Hypercholesteremia    Hypertension    Parkinson disease ([place])    Pneumonia    Seasonal allergies    Sepsis ([place])    Septic shock ([place]) 10/08/2016   UTI (lower urinary tract infection)     Family History: Family History  Problem Relation Age of Onset   Cancer Sister        ovarian/uterine/breast   Breast cancer Sister 9   Cancer Sister        breast   Heart disease Sister    Breast cancer Sister 65   Heart disease Father    Cancer Father        liver   Pancreatic cancer Mother    Heart disease Brother    Heart attack Brother    Cancer Brother        liver   Heart disease Sister    Diabetes Maternal Grandmother     Social History: Social History   Socioeconomic History   Marital status: Widowed    Spouse name: Not on file   Number of children: 3   Years of education: Not on file   Highest education level: Some college, no degree  Occupational History   Occupation: retired  Tobacco Use   Smoking status: Former    Packs/day: 1.00    Years: 30.00    Pack years: 30.00    Types: Cigarettes    Quit date: 08/01/2008    Years since quitting: 13.6   Smokeless tobacco: Never  Vaping Use   Vaping Use: Never used  Substance and Sexual Activity   Alcohol use: Yes    Alcohol/week: 1.0 standard drink     Types: 1 Shots of liquor per week   Drug use: No   Sexual activity: Not on file  Other Topics Concern   Not on file  Social History Narrative   Not on file   Social Determinants of Health   Financial Resource Strain: Low Risk    Difficulty of Paying Living  Expenses: Not hard at all  Food Insecurity: No Food Insecurity   Worried About Charity fundraiser in the Last Year: Never true   Ran Out of Food in the Last Year: Never true  Transportation Needs: No Transportation Needs   Lack of Transportation (Medical): No   Lack of Transportation (Non-Medical): No  Physical Activity: Insufficiently Active   Days of Exercise per Week: 3 days   Minutes of Exercise per Session: 20 min  Stress: No Stress Concern Present   Feeling of Stress : Not at all  Social Connections: Socially Isolated   Frequency of Communication with Friends and Family: More than three times a week   Frequency of Social Gatherings with Friends and Family: Twice a week   Attends Religious Services: Never   Marine scientist or Organizations: No   Attends Archivist Meetings: Never   Marital Status: Widowed  Human resources officer Violence: Not At Risk   Fear of Current or Ex-Partner: No   Emotionally Abused: No   Physically Abused: No   Sexually Abused: No    Vital Signs: Blood pressure 113/83, pulse (!) 102, temperature 98.6 F (37 C), resp. rate 16, height 5' 1"  (1.549 m), weight 131 lb 12.8 oz (59.8 kg), SpO2 96 %.  Examination: General Appearance: The patient is well-developed, well-nourished, and in no distress. Skin: Gross inspection of skin unremarkable. Head: normocephalic, no gross deformities. Eyes: no gross deformities noted. ENT: ears appear grossly normal no exudates. Neck: Supple. No thyromegaly. No LAD. Respiratory: Few scattered rhonchi. Cardiovascular: Normal S1 and S2 without murmur or rub. Extremities: No cyanosis. pulses are equal. Neurologic: Alert and oriented. No involuntary  movements.  LABS: No results found for this or any previous visit (from the past 2160 hour(s)).  Radiology: DG Shoulder Right  Result Date: 01/06/2022 CLINICAL DATA:  Pain with decreased range of motion. Unwitnessed fall. Slip and fall getting up in the bathroom. EXAM: RIGHT SHOULDER - 2+ VIEW COMPARISON:  None. FINDINGS: Comminuted mildly displaced proximal humerus fracture. Dominant fracture plane appears through the surgical neck. There is questionable nondisplaced extension into the glenohumeral joint. Normal acromioclavicular joint. Soft tissue edema seen. There are remote right rib fractures with callus. IMPRESSION: Comminuted mildly displaced proximal humerus fracture. There may be involvement of the glenohumeral joint. Electronically Signed   By: Keith Rake M.D.   On: 01/06/2022 22:11    No results found.  No results found.    Assessment and Plan: Patient Active Problem List   Diagnosis Date Noted   Low blood pressure 01/28/2022   Subclavian steal syndrome of left subclavian artery 01/16/2022   Status post hardware removal 11/20/2021   Acute postoperative pain 11/11/2021   Periprosthetic fracture around internal prosthetic right elbow joint 07/25/2020   Pain in joint of right elbow 07/19/2020   Closed fracture of distal end of left radius 10/27/2019   Closed Monteggia's fracture of right arm 09/24/2019   Crohn's disease of colon without complication ([place]) 54/62/7035   Hearing loss 06/01/2019   Vertigo 06/01/2019   Hypokalemia 07/22/2018   Oral thrush 07/22/2018   Gastroenteritis 07/16/2018   Primary parkinsonism ([place]) 01/05/2018   SIADH (syndrome of inappropriate ADH production) ([place]) 10/01/2017   Spinal stenosis 10/01/2017   Elevated troponin I level 11/18/2016   COPD (chronic obstructive pulmonary disease) with chronic bronchitis ([place]) 08/12/2016   Asthma without status asthmaticus 03/31/2016   Osteoporosis 03/31/2016   Fatty liver determined by biopsy  09/17/2015   Parotid swelling  09/13/2015   Urticaria 09/13/2015   History of CVA (cerebrovascular accident) 08/21/2015   Senile purpura ([place]) 08/21/2015   Atrial tachycardia ([place]) 08/13/2015   Eczema 07/10/2015   Allergic rhinitis 06/26/2015   Anxiety 04/23/2015   Edema leg 03/27/2015   Generalized weakness 03/06/2015   Primary cardiomyopathy ([place]) 03/05/2015   CAD (coronary artery disease) 03/05/2015   Benign essential HTN 02/28/2015   Combined fat and carbohydrate induced hyperlipemia 09/06/2014   Mixed hyperlipidemia 09/06/2014   Acid reflux 08/01/2014   Gastro-esophageal reflux disease without esophagitis 08/01/2014    1. SOB (shortness of breath) Multiple factors including history of asthma as well as risk for aspiration.  Plan is going to be to continue with supportive care and inhalers as needed spirometry was done today and reviewed - Spirometry with Graph  2. COPD (chronic obstructive pulmonary disease) with chronic bronchitis ([place]) She will continue with her as needed albuterol and in addition to this she is on Spiriva Respimat which does seem to be helping.  This should be continued also.  3. Primary parkinsonism ([place]) Continue to follow-up with the patient's primary neurologist  4. Gastroesophageal reflux disease without esophagitis Under control as mentioned she is at risk for aspiration events   General Counseling: I have discussed the findings of the evaluation and examination with Ethel.  I have also discussed any further diagnostic evaluation thatmay be needed or ordered today. Christa verbalizes understanding of the findings of todays visit. We also reviewed her medications today and discussed drug interactions and side effects including but not limited excessive drowsiness and altered mental states. We also discussed that there is always a risk not just to her but also people around her. she has been encouraged to call the office with any questions or concerns that  should arise related to todays visit.  Orders Placed This Encounter  Procedures   Spirometry with Graph    Order Specific Question:   Where should this test be performed?    Answer:   [facility]    Order Specific Question:   Basic spirometry    Answer:   Yes    Order Specific Question:   Spirometry pre & post bronchodilator    Answer:   No     Time spent: 32  I have personally obtained a history, examined the patient, evaluated laboratory and imaging results, formulated the assessment and plan and placed orders.    Allyne Gee, MD [facility] Pulmonary and Critical Care Sleep medicine

## 2022-03-30 NOTE — Patient Instructions (Signed)
Chronic Obstructive Pulmonary Disease  Chronic obstructive pulmonary disease (COPD) is a long-term (chronic) lung problem. When you have COPD, it is hard for air to get in and out of your lungs. Usually the condition gets worse over time, and your lungs will never return to normal. There are things you can do to keep yourself as healthy as possible. What are the causes? Smoking. This is the most common cause. Certain genes passed from parent to child (inherited). What increases the risk? Being exposed to secondhand smoke from cigarettes, pipes, or cigars. Being exposed to chemicals and other irritants, such as fumes and dust in the work environment. Having chronic lung conditions or infections. What are the signs or symptoms? Shortness of breath, especially during physical activity. A long-term cough with a large amount of thick mucus. Sometimes, the cough may not have any mucus (dry cough). Wheezing. Breathing quickly. Skin that looks gray or blue, especially in the fingers, toes, or lips. Feeling tired (fatigue). Weight loss. Chest tightness. Having infections often. Episodes when breathing symptoms become much worse (exacerbations). At the later stages of this disease, you may have swelling in the ankles, feet, or legs. How is this treated? Taking medicines. Quitting smoking, if you smoke. Rehabilitation. This includes steps to make your body work better. It may involve a team of specialists. Doing exercises. Making changes to your diet. Using oxygen. Lung surgery. Lung transplant. Comfort measures (palliative care). Follow these instructions at home: Medicines Take over-the-counter and prescription medicines only as told by your doctor. Talk to your doctor before taking any cough or allergy medicines. You may need to avoid medicines that cause your lungs to be dry. Lifestyle If you smoke, stop smoking. Smoking makes the problem worse. Do not smoke or use any products that  contain nicotine or tobacco. If you need help quitting, ask your doctor. Avoid being around things that make your breathing worse. This may include smoke, chemicals, and fumes. Stay active, but remember to rest as well. Learn and use tips on how to manage stress and control your breathing. Make sure you get enough sleep. Most adults need at least 7 hours of sleep every night. Eat healthy foods. Eat smaller meals more often. Rest before meals. Controlled breathing Learn and use tips on how to control your breathing as told by your doctor. Try: Breathing in (inhaling) through your nose for 1 second. Then, pucker your lips and breath out (exhale) through your lips for 2 seconds. Putting one hand on your belly (abdomen). Breathe in slowly through your nose for 1 second. Your hand on your belly should move out. Pucker your lips and breathe out slowly through your lips. Your hand on your belly should move in as you breathe out.  Controlled coughing Learn and use controlled coughing to clear mucus from your lungs. Follow these steps: Lean your head a little forward. Breathe in deeply. Try to hold your breath for 3 seconds. Keep your mouth slightly open while coughing 2 times. Spit any mucus out into a tissue. Rest and do the steps again 1 or 2 times as needed. General instructions Make sure you get all the shots (vaccines) that your doctor recommends. Ask your doctor about a flu shot and a pneumonia shot. Use oxygen therapy and pulmonary rehabilitation if told by your doctor. If you need home oxygen therapy, ask your doctor if you should buy a tool to measure your oxygen level (oximeter). Make a COPD action plan with your doctor. This helps you   to know what to do if you feel worse than usual. Manage any other conditions you have as told by your doctor. Avoid going outside when it is very hot, cold, or humid. Avoid people who have a sickness you can catch (contagious). Keep all follow-up  visits. Contact a doctor if: You cough up more mucus than usual. There is a change in the color or thickness of the mucus. It is harder to breathe than usual. Your breathing is faster than usual. You have trouble sleeping. You need to use your medicines more often than usual. You have trouble doing your normal activities such as getting dressed or walking around the house. Get help right away if: You have shortness of breath while resting. You have shortness of breath that stops you from: Being able to talk. Doing normal activities. Your chest hurts for longer than 5 minutes. Your skin color is more blue than usual. Your pulse oximeter shows that you have low oxygen for longer than 5 minutes. You have a fever. You feel too tired to breathe normally. These symptoms may represent a serious problem that is an emergency. Do not wait to see if the symptoms will go away. Get medical help right away. Call your local emergency services (911 in the [place] Do not drive yourself to the hospital. Summary Chronic obstructive pulmonary disease (COPD) is a long-term lung problem. The way your lungs work will never return to normal. Usually the condition gets worse over time. There are things you can do to keep yourself as healthy as possible. Take over-the-counter and prescription medicines only as told by your doctor. If you smoke, stop. Smoking makes the problem worse. This information is not intended to replace advice given to you by your health care provider. Make sure you discuss any questions you have with your health care provider. Document Revised: 09/10/2020 Document Reviewed: 09/10/2020 Elsevier Patient Education  2023 Elsevier Inc.  

## 2022-04-08 ENCOUNTER — Other Ambulatory Visit: Payer: Self-pay | Admitting: Family Medicine

## 2022-04-08 DIAGNOSIS — S42201A Unspecified fracture of upper end of right humerus, initial encounter for closed fracture: Secondary | ICD-10-CM | POA: Diagnosis not present

## 2022-04-14 ENCOUNTER — Encounter: Payer: Self-pay | Admitting: Family Medicine

## 2022-04-14 ENCOUNTER — Other Ambulatory Visit: Payer: Self-pay

## 2022-04-14 ENCOUNTER — Telehealth: Payer: Self-pay

## 2022-04-14 ENCOUNTER — Ambulatory Visit (INDEPENDENT_AMBULATORY_CARE_PROVIDER_SITE_OTHER): Payer: Medicare PPO | Admitting: Family Medicine

## 2022-04-14 VITALS — BP 102/71 | HR 94 | Resp 16 | Ht 61.0 in | Wt 131.8 lb

## 2022-04-14 DIAGNOSIS — Z0000 Encounter for general adult medical examination without abnormal findings: Secondary | ICD-10-CM

## 2022-04-14 DIAGNOSIS — Z1211 Encounter for screening for malignant neoplasm of colon: Secondary | ICD-10-CM | POA: Diagnosis not present

## 2022-04-14 DIAGNOSIS — D692 Other nonthrombocytopenic purpura: Secondary | ICD-10-CM | POA: Diagnosis not present

## 2022-04-14 DIAGNOSIS — Z86010 Personal history of colonic polyps: Secondary | ICD-10-CM

## 2022-04-14 MED ORDER — PEG 3350-KCL-NA BICARB-NACL 420 G PO SOLR: 4000.0000 mL | Freq: Once | ORAL | 0 refills | Status: AC

## 2022-04-14 NOTE — Progress Notes (Signed)
Complete physical exam  I,Joseline E Rosas,acting as a scribe for [name], MD.,have documented all relevant documentation on the behalf of [name], MD,as directed by  [name], MD while in the presence of [name], MD.   Patient: Lori Brady   DOB: 1946-05-14   76 y.o. Female  MRN: [id number] Visit Date: 04/14/2022  Today's healthcare provider: [name], MD   Chief Complaint  Patient presents with   Annual Exam   Subjective    LYNEL FORESTER is a 76 y.o. female who presents today for a complete physical exam.  She reports consuming a general diet. The patient does not participate in regular exercise at present. She generally feels well. She reports sleeping well. She does not have additional problems to discuss today.  HPI  AWV done 01/29/2022  Se called GI to get colonoscopy scheduled and she never got a return call She is planning to schedule mammogram - needs transportation  Past Medical History:  Diagnosis Date   Arthritis    Asthma    Crohn's disease ([place])    Emphysema of lung ([place])    GERD (gastroesophageal reflux disease)    Hip fracture ([place]) 01/26/2017   History of chicken pox    History of heart attack    Hx of completed stroke    Hypercholesteremia    Hypertension    Parkinson disease ([place])    Pneumonia    Seasonal allergies    Sepsis ([place])    Septic shock ([place]) 10/08/2016   UTI (lower urinary tract infection)    Past Surgical History:  Procedure Laterality Date   BREAST BIOPSY Bilateral 1960's to 90's   breast cysts     CATARACT EXTRACTION, BILATERAL     ELBOW SURGERY     right   right wrist fracture     TUBAL LIGATION     WRIST SURGERY Left    Social History   Socioeconomic History   Marital status: Widowed    Spouse name: Not on file   Number of children: 3   Years of education: Not on file   Highest education level: Some college, no degree  Occupational History   Occupation: retired   Tobacco Use   Smoking status: Former    Packs/day: 1.00    Years: 30.00    Pack years: 30.00    Types: Cigarettes    Quit date: 08/01/2008    Years since quitting: 13.7   Smokeless tobacco: Never  Vaping Use   Vaping Use: Never used  Substance and Sexual Activity   Alcohol use: Yes    Alcohol/week: 1.0 standard drink    Types: 1 Shots of liquor per week   Drug use: No   Sexual activity: Not on file  Other Topics Concern   Not on file  Social History Narrative   Not on file   Social Determinants of Health   Financial Resource Strain: Low Risk    Difficulty of Paying Living Expenses: Not hard at all  Food Insecurity: No Food Insecurity   Worried About Charity fundraiser in the Last Year: Never true   [place] in the Last Year: Never true  Transportation Needs: No Transportation Needs   Lack of Transportation (Medical): No   Lack of Transportation (Non-Medical): No  Physical Activity: Insufficiently Active   Days of Exercise per Week: 3 days   Minutes of Exercise per Session: 20 min  Stress: No Stress Concern  Present   Feeling of Stress : Not at all  Social Connections: Socially Isolated   Frequency of Communication with Friends and Family: More than three times a week   Frequency of Social Gatherings with Friends and Family: Twice a week   Attends Religious Services: Never   Marine scientist or Organizations: No   Attends Archivist Meetings: Never   Marital Status: Widowed  Human resources officer Violence: Not At Risk   Fear of Current or Ex-Partner: No   Emotionally Abused: No   Physically Abused: No   Sexually Abused: No   Family Status  Relation Name Status   Sister  Alive   Sister  Deceased at age 37   Father  Deceased at age 70   Brother  Alive   Mother  Deceased at age 68       carcinoma   Brother  Deceased   Brother  Comptroller   Sister  (Not Specified)   MGM  (Not Specified)   Family History  Problem Relation Age of Onset   Cancer  Sister        ovarian/uterine/breast   Breast cancer Sister 73   Cancer Sister        breast   Heart disease Sister    Breast cancer Sister 15   Heart disease Father    Cancer Father        liver   Pancreatic cancer Mother    Heart disease Brother    Heart attack Brother    Cancer Brother        liver   Heart disease Sister    Diabetes Maternal Grandmother    Allergies  Allergen Reactions   Levofloxacin Nausea Only and Nausea And Vomiting    Can take if given something for n/v   Vicodin [Hydrocodone-Acetaminophen] Nausea Only    Patient Care Team: Virginia Crews, MD as PCP - General (Family Medicine) Corey Skains, MD as Consulting Physician (Cardiology) Ree Edman, MD as Consulting Physician (Dermatology) Allyne Gee, MD as Consulting Physician (Internal Medicine) Katy Apo, MD as Consulting Physician (Ophthalmology) Gabriel Carina, Betsey Holiday, MD as Physician Assistant (Endocrinology) Vladimir Crofts, MD as Consulting Physician (Neurology) Rutherford Limerick, MD as Attending Physician (Orthopedic Surgery)   Medications: Outpatient Medications Prior to Visit  Medication Sig   albuterol (ACCUNEB) 0.63 MG/3ML nebulizer solution Take 1 ampule by nebulization every 6 (six) hours as needed for wheezing.   albuterol (VENTOLIN HFA) 108 (90 Base) MCG/ACT inhaler INHALE 2 PUFFS INTO THE LUNGS EVERY 6 HOURS AS NEEDED FOR WHEEZING OR SHORTNESS OF BREATH   alendronate (FOSAMAX) 70 MG tablet Take 1 tablet (70 mg total) by mouth once a week. Take with a full glass of water on an empty stomach.   aspirin 81 MG EC tablet Take by mouth.   atorvastatin (LIPITOR) 40 MG tablet Take 1 tablet (40 mg total) by mouth at bedtime.   azelastine (ASTELIN) 0.1 % nasal spray Place 2 sprays into both nostrils daily at 6 (six) AM.   betamethasone valerate (VALISONE) 0.1 % cream APPLY TOPICALLY TO AFFECTED AREA SPARINGLY 2 TIMES DAILY.   BREO ELLIPTA 100-25 MCG/ACT AEPB INHALE 1 PUFF  BY MOUTH ONCE DAILY AS DIRECTED   carbidopa-levodopa (SINEMET CR) 50-200 MG tablet Take 1 tablet by mouth daily. Before dose   carbidopa-levodopa (SINEMET CR) 50-200 MG tablet Take 1 tablet by mouth at bedtime.   carbidopa-levodopa (SINEMET IR) 25-100 MG tablet 3 pill in the morning,  then 2 pills two times a day   cetirizine (ZYRTEC) 10 MG tablet TAKE 1 TABLET BY MOUTH ONCE DAILY   Cholecalciferol (VITAMIN D) 2000 UNITS CAPS Take 1 capsule by mouth daily.   citalopram (CELEXA) 20 MG tablet TAKE 1 TABLET BY MOUTH ONCE DAILY   clobetasol (TEMOVATE) 0.05 % external solution Apply 1 application topically 2 (two) times daily. As needed   clobetasol cream (TEMOVATE) 4.40 % Apply 1 application topically 2 (two) times daily. As needed   doxepin (SINEQUAN) 25 MG capsule TAKE 1 CAPSULE BY MOUTH AT BEDTIME   fluticasone (FLONASE) 50 MCG/ACT nasal spray    ketoconazole (NIZORAL) 2 % shampoo Apply 1 application. topically 2 (two) times a week.   melatonin 5 MG TABS Take 1 tablet by mouth at bedtime.   meloxicam (MOBIC) 7.5 MG tablet TAKE 1 TABLET BY MOUTH ONCE DAILY   Meloxicam 10 MG CAPS    montelukast (SINGULAIR) 10 MG tablet TAKE 1 TABLET BY MOUTH ONCE EVERY EVENING   omeprazole (PRILOSEC) 40 MG capsule TAKE 1 CAPSULE BY MOUTH ONCE DAILY   Tiotropium Bromide Monohydrate (SPIRIVA RESPIMAT) 2.5 MCG/ACT AERS INHALE 2 PUFFS BY MOUTH ONCE DAILY FOR COPD   traMADol (ULTRAM) 50 MG tablet    [DISCONTINUED] acetaminophen (TYLENOL) 500 MG tablet Take 500 mg by mouth every 6 (six) hours as needed.    [DISCONTINUED] HYDROcodone-acetaminophen (NORCO) 7.5-325 MG tablet Take 0.5-1 tablets by mouth every 6 (six) hours as needed for moderate pain.   [DISCONTINUED] ondansetron (ZOFRAN-ODT) 4 MG disintegrating tablet Take 1 tablet (4 mg total) by mouth every 8 (eight) hours as needed for nausea or vomiting.   No facility-administered medications prior to visit.    Review of Systems  Constitutional: Negative.   HENT:   Positive for hearing loss and nosebleeds.   Eyes: Negative.   Respiratory:  Positive for cough and wheezing.   Cardiovascular: Negative.   Gastrointestinal: Negative.   Endocrine: Negative.   Genitourinary: Negative.   Musculoskeletal: Negative.   Skin: Negative.   Allergic/Immunologic: Negative.   Neurological:  Positive for dizziness.  Hematological: Negative.   Psychiatric/Behavioral: Negative.       Objective     BP 102/71 (BP Location: Left Arm, Patient Position: Sitting, Cuff Size: Normal)   Pulse 94   Resp 16   Ht 5' 1"  (1.549 m)   Wt 131 lb 12.8 oz (59.8 kg)   BMI 24.90 kg/m     Physical Exam Vitals reviewed.  Constitutional:      General: She is not in acute distress.    Appearance: She is well-developed. She is not diaphoretic.     Comments: In wheelchair, brace on R arm/elbow  HENT:     Head: Normocephalic and atraumatic.  Eyes:     General: No scleral icterus.    Conjunctiva/sclera: Conjunctivae normal.  Neck:     Thyroid: No thyromegaly.  Cardiovascular:     Rate and Rhythm: Normal rate and regular rhythm.     Heart sounds: Normal heart sounds. No murmur heard. Pulmonary:     Effort: Pulmonary effort is normal. No respiratory distress.     Breath sounds: Normal breath sounds. No wheezing, rhonchi or rales.  Musculoskeletal:     Cervical back: Neck supple.     Right lower leg: No edema.     Left lower leg: No edema.  Lymphadenopathy:     Cervical: No cervical adenopathy.  Skin:    General: Skin is warm and dry.  Neurological:  Mental Status: She is alert and oriented to person, place, and time. Mental status is at baseline.  Psychiatric:        Mood and Affect: Mood normal.        Behavior: Behavior normal.      Last depression screening scores    04/14/2022   11:02 AM 01/29/2022    1:06 PM 12/15/2021    1:13 PM  PHQ 2/9 Scores  PHQ - 2 Score 0 1 2  PHQ- 9 Score  2 3   Last fall risk screening    04/14/2022   11:02 AM  Fall Risk    Falls in the past year? 1  Number falls in past yr: 1  Injury with Fall? 1  Comment right arm   Last Audit-C alcohol use screening    04/14/2022   11:03 AM  Alcohol Use Disorder Test (AUDIT)  1. How often do you have a drink containing alcohol? 0  2. How many drinks containing alcohol do you have on a typical day when you are drinking? 0  3. How often do you have six or more drinks on one occasion? 0  AUDIT-C Score 0   A score of 3 or more in women, and 4 or more in men indicates increased risk for alcohol abuse, EXCEPT if all of the points are from question 1   No results found for any visits on 04/14/22.  Assessment & Plan    Routine Health Maintenance and Physical Exam  Exercise Activities and Dietary recommendations  Goals      DIET - EAT MORE FRUITS AND VEGETABLES     Improve Ability to Self-Manage Chronic Conditions     Current Barriers:  Chronic Disease Management support and education needs related to Hypertension, COPD, Parkinson's and CAD. (Recent falls resulting in injury to both upper extremities)   Case Manager Clinical Goal(s):  Over the next 90 days, patient will not require unexpected hospital admission. Over the next 90 days, patient will take all medications as prescribed. Over the next 90 days, patient will notify provider if status changes and with worsening s/sx related to cardiac or respiratory complications. Over the next 90 days, patient will work with team to determine level of care concerns and possible need for additional assistance in the home. Over the next 90 days, patient will adhere to recommended safety measures to prevent additional injuries. Over the next 90 days, patient will attend all medical appointments as schedule.   Interventions:  Reviewed medications. Encouraged to take medications as prescribed. Family is currently assisting with medication preparation and administration due patient being unable to use her upper extremities.  Denies concerns regarding prescription costs. Discussed worsening s/sx of complications related to cardiac and respiratory emergencies. Discussed indications for seeking immediate medical attention. Reports being unable to monitor BP or complete routine care d/t injuries to both upper extremities. . Reports productive cough with "clear to white" sputum. Denies shortness of breath or chest discomfort. Verbalized awareness of indications for notifying MD Reviewed safety measures and provider recommendations regarding mobility. Reports being primarily wheelchair bound since recent injuries. Reports previously using walker or assistive device. Currently has hard cast to left hand and brace to right elbow. Family members are currently assisting with ADLs. Agreeable to agencies being contacted to provide additional in-home assistance if she qualifies. Reviewed pending appointments. Encouraged to attend appointments as scheduled to prevent delays in care. Reports son, Remo Lipps is currently providing transportation to appointments. Discussed plans for ongoing care  management and follow up. Provided direct contact information for care management team.  Patient Self Care Activities: (Recent falls resulting in injuries to both upper extremities) Unable to self administer medications as prescribed Unable to perform ADLs independently Unable to perform IADLs independently  Initial goal documentation      Weight (lb) < 130 lb (59 kg)     Recommend eating 3 small meals with two healthy protein snacks in between.          Immunization History  Administered Date(s) Administered   Fluad Quad(high Dose 65+) 09/05/2019, 09/10/2021   Influenza Inj Mdck Quad Pf 07/23/2020   Influenza Split 10/28/2011, 08/10/2012   Influenza, High Dose Seasonal PF 08/21/2014, 08/29/2015   Influenza,inj,Quad PF,6+ Mos 08/16/2013   Influenza-Unspecified 08/16/2014, 08/11/2018, 09/10/2021   PFIZER(Purple Top)SARS-COV-2 Vaccination  02/01/2020, 02/27/2020   Pneumococcal Conjugate-13 01/10/2015   Pneumococcal Polysaccharide-23 05/29/2013   Tdap 04/01/2017   Zoster, Live 08/10/2012    Health Maintenance  Topic Date Due   Zoster Vaccines- Shingrix (1 of 2) Never done   MAMMOGRAM  06/17/2018   COLONOSCOPY (Pts 45-14yr Insurance coverage will need to be confirmed)  04/21/2020   COVID-19 Vaccine (3 - Booster for Pfizer series) 04/23/2020   INFLUENZA VACCINE  06/16/2022   DEXA SCAN  01/15/2023   TETANUS/TDAP  04/02/2027   Pneumonia Vaccine 76 Years old  Completed   Hepatitis C Screening  Completed   HPV VACCINES  Aged Out    Discussed health benefits of physical activity, and encouraged her to engage in regular exercise appropriate for her age and condition.  Problem List Items Addressed This Visit       Cardiovascular and Mediastinum   Senile purpura ([place]   Other Visit Diagnoses     Encounter for annual physical exam    -  Primary   Screening for colon cancer       Relevant Orders   Ambulatory referral to Gastroenterology        Return in about 6 months (around 10/15/2022) for chronic disease f/u.     I, ALavon Paganini MD, have reviewed all documentation for this visit. The documentation on 04/14/22 for the exam, diagnosis, procedures, and orders are all accurate and complete.   Zuleima Haser, ADionne Bucy MD, MPH [email or web address]

## 2022-04-14 NOTE — Progress Notes (Signed)
Gastroenterology Pre-Procedure Review  Request Date: 06/01/2022 Requesting Physician: Dr. Vicente Males  PATIENT REVIEW QUESTIONS: The patient responded to the following health history questions as indicated:    1. Are you having any GI issues? no 2. Do you have a personal history of Polyps? yes (LAST COLONOSCOPY ) 3. Do you have a family history of Colon Cancer or Polyps? no 4. Diabetes Mellitus? no 5. Joint replacements in the past 12 months?no 6. Major health problems in the past 3 months?yes (PARKINSONS COPD AND CROHNS ) 7. Any artificial heart valves, MVP, or defibrillator?no    MEDICATIONS & ALLERGIES:    Patient reports the following regarding taking any anticoagulation/antiplatelet therapy:   Plavix, Coumadin, Eliquis, Xarelto, Lovenox, Pradaxa, Brilinta, or Effient? no Aspirin? yes (81 MG)  Patient confirms/reports the following medications:  Current Outpatient Medications  Medication Sig Dispense Refill   albuterol (ACCUNEB) 0.63 MG/3ML nebulizer solution Take 1 ampule by nebulization every 6 (six) hours as needed for wheezing.     albuterol (VENTOLIN HFA) 108 (90 Base) MCG/ACT inhaler INHALE 2 PUFFS INTO THE LUNGS EVERY 6 HOURS AS NEEDED FOR WHEEZING OR SHORTNESS OF BREATH 8.5 g 3   alendronate (FOSAMAX) 70 MG tablet Take 1 tablet (70 mg total) by mouth once a week. Take with a full glass of water on an empty stomach. 12 tablet 3   aspirin 81 MG EC tablet Take by mouth.     atorvastatin (LIPITOR) 40 MG tablet Take 1 tablet (40 mg total) by mouth at bedtime. 90 tablet 0   azelastine (ASTELIN) 0.1 % nasal spray Place 2 sprays into both nostrils daily at 6 (six) AM.     betamethasone valerate (VALISONE) 0.1 % cream APPLY TOPICALLY TO AFFECTED AREA SPARINGLY 2 TIMES DAILY. 15 g 5   BREO ELLIPTA 100-25 MCG/ACT AEPB INHALE 1 PUFF BY MOUTH ONCE DAILY AS DIRECTED 180 each 1   carbidopa-levodopa (SINEMET CR) 50-200 MG tablet Take 1 tablet by mouth daily. Before dose     carbidopa-levodopa  (SINEMET CR) 50-200 MG tablet Take 1 tablet by mouth at bedtime.     carbidopa-levodopa (SINEMET IR) 25-100 MG tablet 3 pill in the morning, then 2 pills two times a day     cetirizine (ZYRTEC) 10 MG tablet TAKE 1 TABLET BY MOUTH ONCE DAILY 30 tablet 1   Cholecalciferol (VITAMIN D) 2000 UNITS CAPS Take 1 capsule by mouth daily.     citalopram (CELEXA) 20 MG tablet TAKE 1 TABLET BY MOUTH ONCE DAILY 90 tablet 0   clobetasol (TEMOVATE) 0.05 % external solution Apply 1 application topically 2 (two) times daily. As needed     clobetasol cream (TEMOVATE) 5.39 % Apply 1 application topically 2 (two) times daily. As needed     doxepin (SINEQUAN) 25 MG capsule TAKE 1 CAPSULE BY MOUTH AT BEDTIME 30 capsule 5   fluticasone (FLONASE) 50 MCG/ACT nasal spray      ketoconazole (NIZORAL) 2 % shampoo Apply 1 application. topically 2 (two) times a week.     melatonin 5 MG TABS Take 1 tablet by mouth at bedtime.     meloxicam (MOBIC) 7.5 MG tablet TAKE 1 TABLET BY MOUTH ONCE DAILY 30 tablet 2   Meloxicam 10 MG CAPS      montelukast (SINGULAIR) 10 MG tablet TAKE 1 TABLET BY MOUTH ONCE EVERY EVENING 90 tablet 0   omeprazole (PRILOSEC) 40 MG capsule TAKE 1 CAPSULE BY MOUTH ONCE DAILY 90 capsule 0   Tiotropium Bromide Monohydrate (SPIRIVA RESPIMAT) 2.5  MCG/ACT AERS INHALE 2 PUFFS BY MOUTH ONCE DAILY FOR COPD 12 g 3   traMADol (ULTRAM) 50 MG tablet      No current facility-administered medications for this visit.    Patient confirms/reports the following allergies:  Allergies  Allergen Reactions   Levofloxacin Nausea Only and Nausea And Vomiting    Can take if given something for n/v   Vicodin [Hydrocodone-Acetaminophen] Nausea Only    No orders of the defined types were placed in this encounter.   AUTHORIZATION INFORMATION Primary Insurance: 1D#: Group #:  Secondary Insurance: 1D#: Group #:  SCHEDULE INFORMATION: Date: 06/01/2022 Time: Location:[facility]

## 2022-04-14 NOTE — Telephone Encounter (Signed)
ERROR

## 2022-04-15 ENCOUNTER — Other Ambulatory Visit: Payer: Self-pay | Admitting: Family Medicine

## 2022-05-06 DIAGNOSIS — S52001A Unspecified fracture of upper end of right ulna, initial encounter for closed fracture: Secondary | ICD-10-CM | POA: Diagnosis not present

## 2022-05-06 DIAGNOSIS — W1839XD Other fall on same level, subsequent encounter: Secondary | ICD-10-CM | POA: Diagnosis not present

## 2022-05-06 DIAGNOSIS — S52091K Other fracture of upper end of right ulna, subsequent encounter for closed fracture with nonunion: Secondary | ICD-10-CM | POA: Diagnosis not present

## 2022-05-06 DIAGNOSIS — W1839XA Other fall on same level, initial encounter: Secondary | ICD-10-CM | POA: Diagnosis not present

## 2022-05-25 DIAGNOSIS — I2510 Atherosclerotic heart disease of native coronary artery without angina pectoris: Secondary | ICD-10-CM | POA: Diagnosis not present

## 2022-05-25 DIAGNOSIS — E782 Mixed hyperlipidemia: Secondary | ICD-10-CM | POA: Diagnosis not present

## 2022-05-25 DIAGNOSIS — G458 Other transient cerebral ischemic attacks and related syndromes: Secondary | ICD-10-CM | POA: Diagnosis not present

## 2022-05-25 DIAGNOSIS — I429 Cardiomyopathy, unspecified: Secondary | ICD-10-CM | POA: Diagnosis not present

## 2022-05-25 DIAGNOSIS — I10 Essential (primary) hypertension: Secondary | ICD-10-CM | POA: Diagnosis not present

## 2022-05-29 ENCOUNTER — Encounter: Payer: Self-pay | Admitting: Gastroenterology

## 2022-06-01 ENCOUNTER — Encounter
Admission: RE | Disposition: A | Discharge status: Home / Self Care | Payer: Self-pay | Source: Ambulatory Visit | Attending: Gastroenterology

## 2022-06-01 ENCOUNTER — Encounter: Payer: Self-pay | Admitting: Gastroenterology

## 2022-06-01 ENCOUNTER — Ambulatory Visit: Payer: Medicare PPO | Admitting: General Practice

## 2022-06-01 ENCOUNTER — Ambulatory Visit
Admission: RE | Admit: 2022-06-01 | Discharge: 2022-06-01 | Disposition: A | Discharge status: Home / Self Care | Payer: Medicare PPO | Source: Ambulatory Visit | Attending: Gastroenterology | Admitting: Gastroenterology

## 2022-06-01 DIAGNOSIS — Z86010 Personal history of colonic polyps: Secondary | ICD-10-CM

## 2022-06-01 DIAGNOSIS — Z1211 Encounter for screening for malignant neoplasm of colon: Secondary | ICD-10-CM | POA: Diagnosis not present

## 2022-06-01 DIAGNOSIS — J439 Emphysema, unspecified: Secondary | ICD-10-CM | POA: Insufficient documentation

## 2022-06-01 DIAGNOSIS — K5090 Crohn's disease, unspecified, without complications: Secondary | ICD-10-CM | POA: Diagnosis not present

## 2022-06-01 DIAGNOSIS — E7800 Pure hypercholesterolemia, unspecified: Secondary | ICD-10-CM | POA: Diagnosis not present

## 2022-06-01 DIAGNOSIS — Z8619 Personal history of other infectious and parasitic diseases: Secondary | ICD-10-CM | POA: Insufficient documentation

## 2022-06-01 DIAGNOSIS — I252 Old myocardial infarction: Secondary | ICD-10-CM | POA: Diagnosis not present

## 2022-06-01 DIAGNOSIS — Z87891 Personal history of nicotine dependence: Secondary | ICD-10-CM | POA: Diagnosis not present

## 2022-06-01 DIAGNOSIS — Z8673 Personal history of transient ischemic attack (TIA), and cerebral infarction without residual deficits: Secondary | ICD-10-CM | POA: Insufficient documentation

## 2022-06-01 DIAGNOSIS — I2510 Atherosclerotic heart disease of native coronary artery without angina pectoris: Secondary | ICD-10-CM | POA: Insufficient documentation

## 2022-06-01 DIAGNOSIS — G20 Parkinson's disease: Secondary | ICD-10-CM | POA: Diagnosis not present

## 2022-06-01 DIAGNOSIS — K219 Gastro-esophageal reflux disease without esophagitis: Secondary | ICD-10-CM | POA: Insufficient documentation

## 2022-06-01 DIAGNOSIS — I10 Essential (primary) hypertension: Secondary | ICD-10-CM | POA: Insufficient documentation

## 2022-06-01 HISTORY — PX: COLONOSCOPY WITH PROPOFOL: SHX5780

## 2022-06-01 SURGERY — COLONOSCOPY WITH PROPOFOL
Anesthesia: General

## 2022-06-01 MED ORDER — IPRATROPIUM-ALBUTEROL 0.5-2.5 (3) MG/3ML IN SOLN
RESPIRATORY_TRACT | Status: AC
  Administered 2022-06-01: 3 mL via RESPIRATORY_TRACT
  Filled 2022-06-01: qty 3

## 2022-06-01 MED ORDER — PROPOFOL 10 MG/ML IV BOLUS
INTRAVENOUS | Status: DC | PRN
  Administered 2022-06-01: 50 mg via INTRAVENOUS
  Administered 2022-06-01: 10 mg via INTRAVENOUS

## 2022-06-01 MED ORDER — IPRATROPIUM-ALBUTEROL 0.5-2.5 (3) MG/3ML IN SOLN
3.0000 mL | Freq: Once | RESPIRATORY_TRACT | Status: AC
Start: 2022-06-01 — End: 2022-06-01

## 2022-06-01 MED ORDER — LIDOCAINE HCL (CARDIAC) PF 100 MG/5ML IV SOSY
PREFILLED_SYRINGE | INTRAVENOUS | Status: DC | PRN
  Administered 2022-06-01: 30 mg via INTRAVENOUS

## 2022-06-01 MED ORDER — LIDOCAINE HCL (PF) 2 % IJ SOLN
INTRAMUSCULAR | Status: AC
  Filled 2022-06-01: qty 5

## 2022-06-01 MED ORDER — PHENYLEPHRINE 80 MCG/ML (10ML) SYRINGE FOR IV PUSH (FOR BLOOD PRESSURE SUPPORT)
PREFILLED_SYRINGE | INTRAVENOUS | Status: DC | PRN
  Administered 2022-06-01 (×2): 120 ug via INTRAVENOUS

## 2022-06-01 MED ORDER — SODIUM CHLORIDE 0.9 % IV SOLN: INTRAVENOUS | Status: DC

## 2022-06-01 MED ORDER — PROPOFOL 1000 MG/100ML IV EMUL
INTRAVENOUS | Status: AC
  Filled 2022-06-01: qty 100

## 2022-06-01 MED ORDER — PROPOFOL 500 MG/50ML IV EMUL
INTRAVENOUS | Status: DC | PRN
  Administered 2022-06-01: 150 ug/kg/min via INTRAVENOUS

## 2022-06-01 NOTE — Anesthesia Preprocedure Evaluation (Addendum)
Anesthesia Evaluation  Patient identified by MRN, date of birth, ID band Patient awake    Reviewed: Allergy & Precautions, NPO status , Patient's Chart, lab work & pertinent test results  Airway Mallampati: III  TM Distance: >3 FB Neck ROM: full    Dental  (+) Teeth Intact   Pulmonary asthma , resolved, COPD, former smoker,     + wheezing      Cardiovascular hypertension, + CAD and + Past MI  Normal cardiovascular exam     Neuro/Psych Anxiety  Neuromuscular disease negative psych ROS   GI/Hepatic Neg liver ROS, GERD  ,  Endo/Other  negative endocrine ROS  Renal/GU negative Renal ROS  negative genitourinary   Musculoskeletal   Abdominal   Peds  Hematology negative hematology ROS (+)   Anesthesia Other Findings PMH of COPD with inhaler use. Patient states she used her rescue inhaler this morning because she felt wheezy. On physical exam, patient was very wheezy bilaterally. Ordered a pre op breathing treatment.   Past Medical History: No date: Arthritis No date: Asthma No date: Crohn's disease ([place]) No date: Emphysema of lung ([facility]) No date: GERD (gastroesophageal reflux disease) 01/26/2017: Hip fracture ([facility]) No date: History of chicken pox No date: History of heart attack No date: Hx of completed stroke No date: Hypercholesteremia No date: Hypertension No date: Parkinson disease ([place]) No date: Pneumonia No date: Seasonal allergies No date: Sepsis ([place]) 10/08/2016: Septic shock ([place]) No date: UTI (lower urinary tract infection)  Past Surgical History: 1960's to 90's: BREAST BIOPSY; Bilateral No date: breast cysts No date: CATARACT EXTRACTION, BILATERAL No date: ELBOW SURGERY     Comment:  right No date: right wrist fracture No date: TUBAL LIGATION No date: WRIST SURGERY; Left     Reproductive/Obstetrics negative OB ROS                           Anesthesia Physical Anesthesia  Plan  ASA: 3  Anesthesia Plan: General   Post-op Pain Management: Minimal or no pain anticipated   Induction: Intravenous  PONV Risk Score and Plan: 3 and Propofol infusion, TIVA and Ondansetron  Airway Management Planned: Nasal Cannula  Additional Equipment: None  Intra-op Plan:   Post-operative Plan:   Informed Consent: I have reviewed the patients History and Physical, chart, labs and discussed the procedure including the risks, benefits and alternatives for the proposed anesthesia with the patient or authorized representative who has indicated his/her understanding and acceptance.     Dental advisory given  Plan Discussed with: CRNA and Surgeon  Anesthesia Plan Comments: (Discussed risks of anesthesia with patient, including possibility of difficulty with spontaneous ventilation under anesthesia necessitating airway intervention, PONV, and rare risks such as cardiac or respiratory or neurological events, and allergic reactions. Discussed the role of CRNA in patient's perioperative care. Patient understands.)       Anesthesia Quick Evaluation

## 2022-06-01 NOTE — Anesthesia Procedure Notes (Signed)
Procedure Name: MAC Date/Time: 06/01/2022 8:15 AM  Performed by: Tollie Eth, CRNAPre-anesthesia Checklist: Patient identified, Emergency Drugs available, Suction available and Patient being monitored Patient Re-evaluated:Patient Re-evaluated prior to induction Oxygen Delivery Method: Nasal cannula Induction Type: IV induction Placement Confirmation: positive ETCO2

## 2022-06-01 NOTE — Op Note (Signed)
[facility] Gastroenterology Patient Name: Lori Brady Procedure Date: 06/01/2022 7:17 AM MRN: [id number] Account #: [id number] Date of Birth: [birth year] Admit Type: Outpatient Age: 76 Room: [facility] ENDO ROOM 1 Gender: Female Note Status: Finalized Instrument Name: Park Meo [id number] Procedure:             Colonoscopy Indications:           Surveillance: Personal history of colonic polyps                         (unknown histology) on last colonoscopy more than 5                         years ago Providers:             [name] MD, MD Referring MD:          Dionne Bucy. Bacigalupo (Referring MD) Medicines:             Monitored Anesthesia Care Complications:         No immediate complications. Procedure:             Pre-Anesthesia Assessment:                        - Prior to the procedure, a History and Physical was                         performed, and patient medications, allergies and                         sensitivities were reviewed. The patient's tolerance                         of previous anesthesia was reviewed.                        - The risks and benefits of the procedure and the                         sedation options and risks were discussed with the                         patient. All questions were answered and informed                         consent was obtained.                        - ASA Grade Assessment: II - A patient with mild                         systemic disease.                        After obtaining informed consent, the colonoscope was                         passed under direct vision. Throughout the procedure,                         the patient's blood pressure,  pulse, and oxygen                         saturations were monitored continuously. The                         Colonoscope was introduced through the anus and                         advanced to the the cecum, identified by the                         appendiceal  orifice. The colonoscopy was performed                         with ease. The patient tolerated the procedure well.                         The quality of the bowel preparation was adequate. Findings:      The entire examined colon appeared normal on direct and retroflexion       views. Impression:            - The entire examined colon is normal on direct and                         retroflexion views.                        - No specimens collected. Recommendation:        - Discharge patient to home (with escort).                        - Resume previous diet.                        - Continue present medications.                        - Repeat colonoscopy is not recommended due to current                         age (13 years or older) for surveillance. Procedure Code(s):     --- Professional ---                        [phone], Colonoscopy, flexible; diagnostic, including                         collection of specimen(s) by brushing or washing, when                         performed (separate procedure) Diagnosis Code(s):     --- Professional ---                        Z86.010, Personal history of colonic polyps CPT copyright 2019 American Medical Association. All rights reserved. The codes documented in this report are preliminary and upon coder review may  be revised to meet current compliance requirements. [name], MD [name] MD, MD 06/01/2022 8:47:19 AM This report has been signed electronically.  Number of Addenda: 0 Note Initiated On: 06/01/2022 7:17 AM Scope Withdrawal Time: 0 hours 6 minutes 23 seconds  Total Procedure Duration: 0 hours 15 minutes 12 seconds  Estimated Blood Loss:  Estimated blood loss: none.      [facility]

## 2022-06-01 NOTE — Progress Notes (Signed)
History of Parkinson's. In wheelchair, assisted x1 to stand, unable to walk without walker.  Duoneb treatment adm as ordered by anesthesia. Patient trying to cough up mucus.

## 2022-06-01 NOTE — H&P (Signed)
Lori Bellows, MD [address] [place], [place], [place], [address] [address], [place], [place], [place], [address] Phone: [phone]  Fax: [phone]  Primary Care Physician:  Virginia Crews, MD   Pre-Procedure History & Physical: HPI:  Lori Brady is a 76 y.o. female is here for an colonoscopy.   Past Medical History:  Diagnosis Date   Arthritis    Asthma    Crohn's disease ([place])    Emphysema of lung ([place])    GERD (gastroesophageal reflux disease)    Hip fracture ([place]) 01/26/2017   History of chicken pox    History of heart attack    Hx of completed stroke    Hypercholesteremia    Hypertension    Parkinson disease ([place])    Pneumonia    Seasonal allergies    Sepsis ([place])    Septic shock ([place]) 10/08/2016   UTI (lower urinary tract infection)     Past Surgical History:  Procedure Laterality Date   BREAST BIOPSY Bilateral 1960's to 90's   breast cysts     CATARACT EXTRACTION, BILATERAL     ELBOW SURGERY     right   right wrist fracture     TUBAL LIGATION     WRIST SURGERY Left     Prior to Admission medications   Medication Sig Start Date End Date Taking? Authorizing Provider  albuterol (VENTOLIN HFA) 108 (90 Base) MCG/ACT inhaler INHALE 2 PUFFS INTO THE LUNGS EVERY 6 HOURS AS NEEDED FOR WHEEZING OR SHORTNESS OF BREATH 01/13/22  Yes Allyne Gee, MD  aspirin 81 MG EC tablet Take by mouth. 11/12/21  Yes [provider]  atorvastatin (LIPITOR) 40 MG tablet Take 1 tablet (40 mg total) by mouth at bedtime. 09/10/21  Yes Bacigalupo, Dionne Bucy, MD  BREO ELLIPTA 100-25 MCG/ACT AEPB INHALE 1 PUFF BY MOUTH ONCE DAILY AS DIRECTED 01/13/22  Yes Devona Konig A, MD  carbidopa-levodopa (SINEMET CR) 50-200 MG tablet Take 1 tablet by mouth daily. Before dose 07/08/18  Yes [provider]  carbidopa-levodopa (SINEMET CR) 50-200 MG tablet Take 1 tablet by mouth at bedtime. 01/13/22  Yes [provider]  citalopram (CELEXA) 20 MG tablet TAKE 1  TABLET BY MOUTH ONCE DAILY 03/17/22  Yes Bacigalupo, Dionne Bucy, MD  ibuprofen (ADVIL) 200 MG tablet Take 200 mg by mouth every 6 (six) hours as needed.   Yes [provider]  meloxicam (MOBIC) 7.5 MG tablet TAKE 1 TABLET BY MOUTH ONCE DAILY 12/19/21  Yes Bacigalupo, Dionne Bucy, MD  montelukast (SINGULAIR) 10 MG tablet TAKE 1 TABLET BY MOUTH ONCE EVERY EVENING 04/15/22  Yes Bacigalupo, Dionne Bucy, MD  omeprazole (PRILOSEC) 40 MG capsule TAKE 1 CAPSULE BY MOUTH ONCE DAILY 04/08/22  Yes Bacigalupo, Dionne Bucy, MD  Tiotropium Bromide Monohydrate (SPIRIVA RESPIMAT) 2.5 MCG/ACT AERS INHALE 2 PUFFS BY MOUTH ONCE DAILY FOR COPD 10/20/21  Yes Allyne Gee, MD  albuterol (ACCUNEB) 0.63 MG/3ML nebulizer solution Take 1 ampule by nebulization every 6 (six) hours as needed for wheezing.    [provider]  alendronate (FOSAMAX) 70 MG tablet Take 1 tablet (70 mg total) by mouth once a week. Take with a full glass of water on an empty stomach. 12/18/21   Virginia Crews, MD  azelastine (ASTELIN) 0.1 % nasal spray Place 2 sprays into both nostrils daily at 6 (six) AM. 09/05/20   [provider]  betamethasone valerate (VALISONE) 0.1 % cream APPLY TOPICALLY TO AFFECTED AREA SPARINGLY 2  TIMES DAILY. 07/10/15   Margarita Rana, MD  carbidopa-levodopa (SINEMET IR) 25-100 MG tablet 3 pill in the morning, then 2 pills two times a day 01/13/22   [provider]  cetirizine (ZYRTEC) 10 MG tablet TAKE 1 TABLET BY MOUTH ONCE DAILY 01/13/22   Allyne Gee, MD  Cholecalciferol (VITAMIN D) 2000 UNITS CAPS Take 1 capsule by mouth daily.    [provider]  clobetasol (TEMOVATE) 0.05 % external solution Apply 1 application topically 2 (two) times daily. As needed    [provider]  clobetasol cream (TEMOVATE) 3.84 % Apply 1 application topically 2 (two) times daily. As needed    [provider]  doxepin (SINEQUAN) 25 MG capsule TAKE 1 CAPSULE BY MOUTH AT BEDTIME 02/26/22    Virginia Crews, MD  fluticasone Asencion Islam) 50 MCG/ACT nasal spray  09/05/16   [provider]  ketoconazole (NIZORAL) 2 % shampoo Apply 1 application. topically 2 (two) times a week. 09/04/19   [provider]  melatonin 5 MG TABS Take 1 tablet by mouth at bedtime.    [provider]  Meloxicam 10 MG CAPS     [provider]  traMADol Veatrice Bourbon) 50 MG tablet     [provider]    Allergies as of 04/14/2022 - Review Complete 04/14/2022  Allergen Reaction Noted   Levofloxacin Nausea Only and Nausea And Vomiting 05/03/2015   Vicodin [hydrocodone-acetaminophen] Nausea Only 08/01/2014    Family History  Problem Relation Age of Onset   Cancer Sister        ovarian/uterine/breast   Breast cancer Sister 58   Cancer Sister        breast   Heart disease Sister    Breast cancer Sister 21   Heart disease Father    Cancer Father        liver   Pancreatic cancer Mother    Heart disease Brother    Heart attack Brother    Cancer Brother        liver   Heart disease Sister    Diabetes Maternal Grandmother     Social History   Socioeconomic History   Marital status: Widowed    Spouse name: Not on file   Number of children: 3   Years of education: Not on file   Highest education level: Some college, no degree  Occupational History   Occupation: retired  Tobacco Use   Smoking status: Former    Packs/day: 1.00    Years: 30.00    Total pack years: 30.00    Types: Cigarettes    Quit date: 08/01/2008    Years since quitting: 13.8   Smokeless tobacco: Never  Vaping Use   Vaping Use: Never used  Substance and Sexual Activity   Alcohol use: Not Currently    Alcohol/week: 1.0 standard drink of alcohol    Types: 1 Shots of liquor per week   Drug use: No   Sexual activity: Not on file  Other Topics Concern   Not on file  Social History Narrative   Not on file   Social Determinants of Health   Financial Resource Strain: Low Risk   (01/29/2022)   Overall Financial Resource Strain (CARDIA)    Difficulty of Paying Living Expenses: Not hard at all  Food Insecurity: No Food Insecurity (01/29/2022)   Hunger Vital Sign    Worried About Running Out of Food in the Last Year: Never true    [place] in  the Last Year: Never true  Transportation Needs: No Transportation Needs (01/29/2022)   PRAPARE - Hydrologist (Medical): No    Lack of Transportation (Non-Medical): No  Physical Activity: Insufficiently Active (01/29/2022)   Exercise Vital Sign    Days of Exercise per Week: 3 days    Minutes of Exercise per Session: 20 min  Stress: No Stress Concern Present (01/29/2022)   [place]    Feeling of Stress : Not at all  Social Connections: Socially Isolated (01/29/2022)   Social Connection and Isolation Panel [NHANES]    Frequency of Communication with Friends and Family: More than three times a week    Frequency of Social Gatherings with Friends and Family: Twice a week    Attends Religious Services: Never    Marine scientist or Organizations: No    Attends Archivist Meetings: Never    Marital Status: Widowed  Intimate Partner Violence: Not At Risk (01/29/2022)   Humiliation, Afraid, Rape, and Kick questionnaire    Fear of Current or Ex-Partner: No    Emotionally Abused: No    Physically Abused: No    Sexually Abused: No    Review of Systems: See HPI, otherwise negative ROS  Physical Exam: BP 105/79   Pulse 93   Temp (!) 96.2 F (35.7 C) (Temporal)   Resp 16   Ht 5' 4"  (1.626 m)   Wt 59 kg   SpO2 99%   BMI 22.31 kg/m  General:   Alert,  pleasant and cooperative in NAD Head:  Normocephalic and atraumatic. Neck:  Supple; no masses or thyromegaly. Lungs:  Clear throughout to auscultation, normal respiratory effort.    Heart:  +S1, +S2, Regular rate and rhythm, No edema. Abdomen:  Soft, nontender  and nondistended. Normal bowel sounds, without guarding, and without rebound.   Neurologic:  Alert and  oriented x4;  grossly normal neurologically.  Impression/Plan: Crecencio Mc is here for an colonoscopy to be performed for surveillance due to prior history of colon polyps   Risks, benefits, limitations, and alternatives regarding  colonoscopy have been reviewed with the patient.  Questions have been answered.  All parties agreeable.   Lori Bellows, MD  06/01/2022, 8:25 AM

## 2022-06-01 NOTE — Transfer of Care (Signed)
Immediate Anesthesia Transfer of Care Note  Patient: Lori Brady  Procedure(s) Performed: COLONOSCOPY WITH PROPOFOL  Patient Location: Endoscopy Unit  Anesthesia Type:General  Level of Consciousness: awake, alert  and oriented  Airway & Oxygen Therapy: Patient Spontanous Breathing  Post-op Assessment: Report given to RN and Post -op Vital signs reviewed and stable  Post vital signs: Reviewed and stable  Last Vitals:  Vitals Value Taken Time  BP 93/56 06/01/22 0849  Temp 35.7 C 06/01/22 0849  Pulse 78 06/01/22 0850  Resp 18 06/01/22 0850  SpO2 100 % 06/01/22 0850  Vitals shown include unvalidated device data.  Last Pain:  Vitals:   06/01/22 0849  TempSrc: Tympanic  PainSc: Asleep         Complications: No notable events documented.

## 2022-06-01 NOTE — Anesthesia Postprocedure Evaluation (Signed)
Anesthesia Post Note  Patient: Lori Brady  Procedure(s) Performed: COLONOSCOPY WITH PROPOFOL  Patient location during evaluation: Endoscopy Anesthesia Type: General Level of consciousness: awake and alert Pain management: pain level controlled Vital Signs Assessment: post-procedure vital signs reviewed and stable Respiratory status: spontaneous breathing, nonlabored ventilation, respiratory function stable and patient connected to nasal cannula oxygen Cardiovascular status: blood pressure returned to baseline and stable Postop Assessment: no apparent nausea or vomiting Anesthetic complications: no   No notable events documented.   Last Vitals:  Vitals:   06/01/22 0726 06/01/22 0849  BP: 105/79 (!) 93/56  Pulse: 93 78  Resp: 16 19  Temp: (!) 35.7 C (!) 35.7 C  SpO2: 99% 100%    Last Pain:  Vitals:   06/01/22 0849  TempSrc: Tympanic  PainSc: Lori Brady

## 2022-06-02 ENCOUNTER — Encounter: Payer: Self-pay | Admitting: Gastroenterology

## 2022-06-18 ENCOUNTER — Other Ambulatory Visit: Payer: Self-pay | Admitting: Family Medicine

## 2022-06-18 DIAGNOSIS — F419 Anxiety disorder, unspecified: Secondary | ICD-10-CM

## 2022-06-19 NOTE — Telephone Encounter (Signed)
Requested Prescriptions  Pending Prescriptions Disp Refills  . citalopram (CELEXA) 20 MG tablet [Pharmacy Med Name: CITALOPRAM HYDROBROMIDE 20 MG TAB] 90 tablet 0    Sig: TAKE 1 TABLET BY MOUTH ONCE DAILY     Psychiatry:  Antidepressants - SSRI Passed - 06/18/2022  1:58 PM      Passed - Valid encounter within last 6 months    Recent Outpatient Visits          2 months ago Encounter for annual physical exam   [facility], Dionne Bucy, MD   6 months ago Benign essential HTN   [facility], Dionne Bucy, MD   1 year ago Encounter for annual physical exam   [facility], Dionne Bucy, MD   1 year ago Benign essential HTN   [facility] [place], Dionne Bucy, MD   2 years ago Encounter for annual physical exam   [facility] Bacigalupo, Dionne Bucy, MD      Future Appointments            In 3 months Bacigalupo, Dionne Bucy, MD [facility] [place]

## 2022-06-25 ENCOUNTER — Ambulatory Visit: Payer: Self-pay

## 2022-06-25 NOTE — Patient Outreach (Signed)
  Care Coordination   06/25/2022 Name: Lori Brady MRN: [id number] DOB: 1946-10-07   Care Coordination Outreach Attempts:  An unsuccessful telephone outreach was attempted today to offer the patient information about available care coordination services as a benefit of their health plan.   Follow Up Plan:  Additional outreach attempts will be made to offer the patient care coordination information and services.   Encounter Outcome:  No Answer  Care Coordination Interventions Activated:  No   Care Coordination Interventions:  No, not indicated    Noreene Larsson RN, MSN, CCM Community Care Coordinator [facility] Mobile: [phone]

## 2022-06-29 ENCOUNTER — Other Ambulatory Visit: Payer: Self-pay | Admitting: Family Medicine

## 2022-07-21 ENCOUNTER — Encounter: Payer: Self-pay | Admitting: Internal Medicine

## 2022-07-21 ENCOUNTER — Ambulatory Visit: Payer: Medicare PPO | Admitting: Internal Medicine

## 2022-07-21 ENCOUNTER — Other Ambulatory Visit: Payer: Self-pay | Admitting: Internal Medicine

## 2022-07-21 ENCOUNTER — Other Ambulatory Visit: Payer: Self-pay | Admitting: Family Medicine

## 2022-07-21 VITALS — BP 148/62 | HR 115 | Temp 98.6°F | Resp 16 | Ht 61.0 in | Wt 130.0 lb

## 2022-07-21 DIAGNOSIS — J209 Acute bronchitis, unspecified: Secondary | ICD-10-CM | POA: Diagnosis not present

## 2022-07-21 DIAGNOSIS — G20 Parkinson's disease: Secondary | ICD-10-CM | POA: Diagnosis not present

## 2022-07-21 DIAGNOSIS — J449 Chronic obstructive pulmonary disease, unspecified: Secondary | ICD-10-CM

## 2022-07-21 DIAGNOSIS — J440 Chronic obstructive pulmonary disease with acute lower respiratory infection: Secondary | ICD-10-CM

## 2022-07-21 MED ORDER — SULFAMETHOXAZOLE-TRIMETHOPRIM 800-160 MG PO TABS: 1.0000 | ORAL_TABLET | Freq: Two times a day (BID) | ORAL | 0 refills | Status: DC

## 2022-07-21 NOTE — Progress Notes (Signed)
[facility] [place], [place] [address]  Pulmonary Sleep Medicine   Office Visit Note  Patient Name: Lori Brady DOB: [birth year] MRN [id number]  Date of Service: 07/21/2022  Complaints/HPI: She has been sick since Friday. She ha cough and sputum green color. Patient has no fever noted. Feel tight no chest pain. She has wheezing noted. She states no edema . No oxygen being used  ROS  General: (-) fever, (-) chills, (-) night sweats, (-) weakness Skin: (-) rashes, (-) itching,. Eyes: (-) visual changes, (-) redness, (-) itching. Nose and Sinuses: (-) nasal stuffiness or itchiness, (-) postnasal drip, (-) nosebleeds, (-) sinus trouble. Mouth and Throat: (-) sore throat, (-) hoarseness. Neck: (-) swollen glands, (-) enlarged thyroid, (-) neck pain. Respiratory: + cough, (-) bloody sputum, + shortness of breath, + wheezing. Cardiovascular: - ankle swelling, (-) chest pain. Lymphatic: (-) lymph node enlargement. Neurologic: (-) numbness, (-) tingling. Psychiatric: (-) anxiety, (-) depression   Current Medication: Outpatient Encounter Medications as of 07/21/2022  Medication Sig Note   albuterol (ACCUNEB) 0.63 MG/3ML nebulizer solution Take 1 ampule by nebulization every 6 (six) hours as needed for wheezing.    albuterol (VENTOLIN HFA) 108 (90 Base) MCG/ACT inhaler INHALE 2 PUFFS INTO THE LUNGS EVERY 6 HOURS AS NEEDED FOR WHEEZING OR SHORTNESS OF BREATH    alendronate (FOSAMAX) 70 MG tablet Take 1 tablet (70 mg total) by mouth once a week. Take with a full glass of water on an empty stomach.    aspirin 81 MG EC tablet Take by mouth.    atorvastatin (LIPITOR) 40 MG tablet Take 1 tablet (40 mg total) by mouth at bedtime.    azelastine (ASTELIN) 0.1 % nasal spray Place 2 sprays into both nostrils daily at 6 (six) AM.    betamethasone valerate (VALISONE) 0.1 % cream APPLY TOPICALLY TO AFFECTED AREA SPARINGLY 2 TIMES DAILY. 01/23/2021: As needed   BREO ELLIPTA  100-25 MCG/ACT AEPB INHALE 1 PUFF BY MOUTH ONCE DAILY AS DIRECTED    carbidopa-levodopa (SINEMET CR) 50-200 MG tablet Take 1 tablet by mouth daily. Before dose    carbidopa-levodopa (SINEMET CR) 50-200 MG tablet Take 1 tablet by mouth at bedtime.    carbidopa-levodopa (SINEMET IR) 25-100 MG tablet 3 pill in the morning, then 2 pills two times a day    cetirizine (ZYRTEC) 10 MG tablet TAKE 1 TABLET BY MOUTH ONCE DAILY    Cholecalciferol (VITAMIN D) 2000 UNITS CAPS Take 1 capsule by mouth daily.    citalopram (CELEXA) 20 MG tablet TAKE 1 TABLET BY MOUTH ONCE DAILY    clobetasol (TEMOVATE) 0.05 % external solution Apply 1 application topically 2 (two) times daily. As needed    clobetasol cream (TEMOVATE) 7.85 % Apply 1 application topically 2 (two) times daily. As needed    doxepin (SINEQUAN) 25 MG capsule TAKE 1 CAPSULE BY MOUTH AT BEDTIME    fluticasone (FLONASE) 50 MCG/ACT nasal spray     ibuprofen (ADVIL) 200 MG tablet Take 200 mg by mouth every 6 (six) hours as needed.    ketoconazole (NIZORAL) 2 % shampoo Apply 1 application. topically 2 (two) times a week.    melatonin 5 MG TABS Take 1 tablet by mouth at bedtime.    meloxicam (MOBIC) 7.5 MG tablet TAKE 1 TABLET BY MOUTH ONCE DAILY    Meloxicam 10 MG CAPS     montelukast (SINGULAIR) 10 MG tablet TAKE 1 TABLET BY MOUTH ONCE EVERY EVENING    omeprazole (  PRILOSEC) 40 MG capsule TAKE 1 CAPSULE BY MOUTH ONCE DAILY    Tiotropium Bromide Monohydrate (SPIRIVA RESPIMAT) 2.5 MCG/ACT AERS INHALE 2 PUFFS BY MOUTH ONCE DAILY FOR COPD    traMADol (ULTRAM) 50 MG tablet     [DISCONTINUED] doxepin (SINEQUAN) 25 MG capsule TAKE 1 CAPSULE BY MOUTH AT BEDTIME    No facility-administered encounter medications on file as of 07/21/2022.    Surgical History: Past Surgical History:  Procedure Laterality Date   BREAST BIOPSY Bilateral 1960's to 90's   breast cysts     CATARACT EXTRACTION, BILATERAL     COLONOSCOPY WITH PROPOFOL N/A 06/01/2022   Procedure:  COLONOSCOPY WITH PROPOFOL;  Surgeon: [name], MD;  Location: [facility] ENDOSCOPY;  Service: Gastroenterology;  Laterality: N/A;   ELBOW SURGERY     right   right wrist fracture     TUBAL LIGATION     WRIST SURGERY Left     Medical History: Past Medical History:  Diagnosis Date   Arthritis    Asthma    Crohn's disease ([place])    Emphysema of lung (HCC)    GERD (gastroesophageal reflux disease)    Hip fracture ([place]) 01/26/2017   History of chicken pox    History of heart attack    Hx of completed stroke    Hypercholesteremia    Hypertension    Parkinson disease ([place])    Pneumonia    Seasonal allergies    Sepsis ([place])    Septic shock ([place]) 10/08/2016   UTI (lower urinary tract infection)     Family History: Family History  Problem Relation Age of Onset   Cancer Sister        ovarian/uterine/breast   Breast cancer Sister 87   Cancer Sister        breast   Heart disease Sister    Breast cancer Sister 25   Heart disease Father    Cancer Father        liver   Pancreatic cancer Mother    Heart disease Brother    Heart attack Brother    Cancer Brother        liver   Heart disease Sister    Diabetes Maternal Grandmother     Social History: Social History   Socioeconomic History   Marital status: Widowed    Spouse name: Not on file   Number of children: 3   Years of education: Not on file   Highest education level: Some college, no degree  Occupational History   Occupation: retired  Tobacco Use   Smoking status: Former    Packs/day: 1.00    Years: 30.00    Total pack years: 30.00    Types: Cigarettes    Quit date: 08/01/2008    Years since quitting: 13.9   Smokeless tobacco: Never  Vaping Use   Vaping Use: Never used  Substance and Sexual Activity   Alcohol use: Not Currently    Alcohol/week: 1.0 standard drink of alcohol    Types: 1 Shots of liquor per week   Drug use: No   Sexual activity: Not on file  Other Topics Concern   Not on file  Social  History Narrative   Not on file   Social Determinants of Health   Financial Resource Strain: Low Risk  (01/29/2022)   Overall Financial Resource Strain (CARDIA)    Difficulty of Paying Living Expenses: Not hard at all  Food Insecurity: No Food Insecurity (01/29/2022)   Hunger Vital Sign  Worried About Charity fundraiser in the Last Year: Never true    [place] in the Last Year: Never true  Transportation Needs: No Transportation Needs (01/29/2022)   PRAPARE - Hydrologist (Medical): No    Lack of Transportation (Non-Medical): No  Physical Activity: Insufficiently Active (01/29/2022)   Exercise Vital Sign    Days of Exercise per Week: 3 days    Minutes of Exercise per Session: 20 min  Stress: No Stress Concern Present (01/29/2022)   [place]    Feeling of Stress : Not at all  Social Connections: Socially Isolated (01/29/2022)   Social Connection and Isolation Panel [NHANES]    Frequency of Communication with Friends and Family: More than three times a week    Frequency of Social Gatherings with Friends and Family: Twice a week    Attends Religious Services: Never    Marine scientist or Organizations: No    Attends Archivist Meetings: Never    Marital Status: Widowed  Intimate Partner Violence: Not At Risk (01/29/2022)   Humiliation, Afraid, Rape, and Kick questionnaire    Fear of Current or Ex-Partner: No    Emotionally Abused: No    Physically Abused: No    Sexually Abused: No    Vital Signs: Blood pressure (!) 148/62, pulse (!) 115, temperature 98.6 F (37 C), resp. rate 16, height 5' 1"  (1.549 m), weight 130 lb (59 kg), SpO2 92 %.  Examination: General Appearance: The patient is well-developed, well-nourished, and in no distress. Skin: Gross inspection of skin unremarkable. Head: normocephalic, no gross deformities. Eyes: no gross deformities  noted. ENT: ears appear grossly normal no exudates. Neck: Supple. No thyromegaly. No LAD. Respiratory: no rhonchi noted. Cardiovascular: Normal S1 and S2 without murmur or rub. Extremities: No cyanosis. pulses are equal. Neurologic: Alert and oriented. No involuntary movements.  LABS: No results found for this or any previous visit (from the past 2160 hour(s)).  Radiology: No results found.  No results found.  No results found.    Assessment and Plan: Patient Active Problem List   Diagnosis Date Noted   Low blood pressure 01/28/2022   Subclavian steal syndrome of left subclavian artery 01/16/2022   Status post hardware removal 11/20/2021   Periprosthetic fracture around internal prosthetic right elbow joint 07/25/2020   Pain in joint of right elbow 07/19/2020   Closed fracture of distal end of left radius 10/27/2019   Closed Monteggia's fracture of right arm 09/24/2019   Crohn's disease of colon without complication ([place]) 49/70/2637   Hearing loss 06/01/2019   Vertigo 06/01/2019   Hypokalemia 07/22/2018   Oral thrush 07/22/2018   Gastroenteritis 07/16/2018   Primary parkinsonism ([place]) 01/05/2018   SIADH (syndrome of inappropriate ADH production) ([place]) 10/01/2017   Spinal stenosis 10/01/2017   Elevated troponin I level 11/18/2016   COPD (chronic obstructive pulmonary disease) with chronic bronchitis ([place]) 08/12/2016   Asthma without status asthmaticus 03/31/2016   Osteoporosis 03/31/2016   Fatty liver determined by biopsy 09/17/2015   Parotid swelling 09/13/2015   Urticaria 09/13/2015   History of CVA (cerebrovascular accident) 08/21/2015   Senile purpura ([place]) 08/21/2015   Atrial tachycardia ([place]) 08/13/2015   Eczema 07/10/2015   Allergic rhinitis 06/26/2015   Anxiety 04/23/2015   Edema leg 03/27/2015   Generalized weakness 03/06/2015   Primary cardiomyopathy ([place]) 03/05/2015   CAD (coronary artery disease) 03/05/2015   Benign essential  HTN 02/28/2015    Combined fat and carbohydrate induced hyperlipemia 09/06/2014   Mixed hyperlipidemia 09/06/2014   Acid reflux 08/01/2014   Gastro-esophageal reflux disease without esophagitis 08/01/2014    1. Acute bronchitis with COPD ([place]) She appears to be having an acute exacerbation of chronic bronchitis.  Spoke with her about prevention once again and for now we will prescribe her some Bactrim - sulfamethoxazole-trimethoprim (BACTRIM DS) 800-160 MG tablet; Take 1 tablet by mouth 2 (two) times daily.  Dispense: 20 tablet; Refill: 0  2. Primary parkinsonism ([place]) This will be continued to be followed by her neurologist.  She has advanced disease and she remains at risk for aspiration  General Counseling: I have discussed the findings of the evaluation and examination with Jack[place]lyn.  I have also discussed any further diagnostic evaluation thatmay be needed or ordered today. Donne verbalizes understanding of the findings of todays visit. We also reviewed her medications today and discussed drug interactions and side effects including but not limited excessive drowsiness and altered mental states. We also discussed that there is always a risk not just to her but also people around her. she has been encouraged to call the office with any questions or concerns that should arise related to todays visit.  No orders of the defined types were placed in this encounter.    Time spent: 69  I have personally obtained a history, examined the patient, evaluated laboratory and imaging results, formulated the assessment and plan and placed orders.    Allyne Gee, MD [facility] Pulmonary and Critical Care Sleep medicine

## 2022-07-21 NOTE — Patient Instructions (Signed)
Chronic Obstructive Pulmonary Disease  Chronic obstructive pulmonary disease (COPD) is a long-term (chronic) lung problem. When you have COPD, it is hard for air to get in and out of your lungs. Usually the condition gets worse over time, and your lungs will never return to normal. There are things you can do to keep yourself as healthy as possible. What are the causes? Smoking. This is the most common cause. Certain genes passed from parent to child (inherited). What increases the risk? Being exposed to secondhand smoke from cigarettes, pipes, or cigars. Being exposed to chemicals and other irritants, such as fumes and dust in the work environment. Having chronic lung conditions or infections. What are the signs or symptoms? Shortness of breath, especially during physical activity. A long-term cough with a large amount of thick mucus. Sometimes, the cough may not have any mucus (dry cough). Wheezing. Breathing quickly. Skin that looks gray or blue, especially in the fingers, toes, or lips. Feeling tired (fatigue). Weight loss. Chest tightness. Having infections often. Episodes when breathing symptoms become much worse (exacerbations). At the later stages of this disease, you may have swelling in the ankles, feet, or legs. How is this treated? Taking medicines. Quitting smoking, if you smoke. Rehabilitation. This includes steps to make your body work better. It may involve a team of specialists. Doing exercises. Making changes to your diet. Using oxygen. Lung surgery. Lung transplant. Comfort measures (palliative care). Follow these instructions at home: Medicines Take over-the-counter and prescription medicines only as told by your doctor. Talk to your doctor before taking any cough or allergy medicines. You may need to avoid medicines that cause your lungs to be dry. Lifestyle If you smoke, stop smoking. Smoking makes the problem worse. Do not smoke or use any products that  contain nicotine or tobacco. If you need help quitting, ask your doctor. Avoid being around things that make your breathing worse. This may include smoke, chemicals, and fumes. Stay active, but remember to rest as well. Learn and use tips on how to manage stress and control your breathing. Make sure you get enough sleep. Most adults need at least 7 hours of sleep every night. Eat healthy foods. Eat smaller meals more often. Rest before meals. Controlled breathing Learn and use tips on how to control your breathing as told by your doctor. Try: Breathing in (inhaling) through your nose for 1 second. Then, pucker your lips and breath out (exhale) through your lips for 2 seconds. Putting one hand on your belly (abdomen). Breathe in slowly through your nose for 1 second. Your hand on your belly should move out. Pucker your lips and breathe out slowly through your lips. Your hand on your belly should move in as you breathe out.  Controlled coughing Learn and use controlled coughing to clear mucus from your lungs. Follow these steps: Lean your head a little forward. Breathe in deeply. Try to hold your breath for 3 seconds. Keep your mouth slightly open while coughing 2 times. Spit any mucus out into a tissue. Rest and do the steps again 1 or 2 times as needed. General instructions Make sure you get all the shots (vaccines) that your doctor recommends. Ask your doctor about a flu shot and a pneumonia shot. Use oxygen therapy and pulmonary rehabilitation if told by your doctor. If you need home oxygen therapy, ask your doctor if you should buy a tool to measure your oxygen level (oximeter). Make a COPD action plan with your doctor. This helps you   to know what to do if you feel worse than usual. Manage any other conditions you have as told by your doctor. Avoid going outside when it is very hot, cold, or humid. Avoid people who have a sickness you can catch (contagious). Keep all follow-up  visits. Contact a doctor if: You cough up more mucus than usual. There is a change in the color or thickness of the mucus. It is harder to breathe than usual. Your breathing is faster than usual. You have trouble sleeping. You need to use your medicines more often than usual. You have trouble doing your normal activities such as getting dressed or walking around the house. Get help right away if: You have shortness of breath while resting. You have shortness of breath that stops you from: Being able to talk. Doing normal activities. Your chest hurts for longer than 5 minutes. Your skin color is more blue than usual. Your pulse oximeter shows that you have low oxygen for longer than 5 minutes. You have a fever. You feel too tired to breathe normally. These symptoms may represent a serious problem that is an emergency. Do not wait to see if the symptoms will go away. Get medical help right away. Call your local emergency services (911 in the [place] Do not drive yourself to the hospital. Summary Chronic obstructive pulmonary disease (COPD) is a long-term lung problem. The way your lungs work will never return to normal. Usually the condition gets worse over time. There are things you can do to keep yourself as healthy as possible. Take over-the-counter and prescription medicines only as told by your doctor. If you smoke, stop. Smoking makes the problem worse. This information is not intended to replace advice given to you by your health care provider. Make sure you discuss any questions you have with your health care provider. Document Revised: 09/10/2020 Document Reviewed: 09/10/2020 Elsevier Patient Education  2023 Elsevier Inc.  

## 2022-07-23 ENCOUNTER — Other Ambulatory Visit: Payer: Self-pay

## 2022-07-23 MED ORDER — PREDNISONE 10 MG (48) PO TBPK: ORAL_TABLET | ORAL | 0 refills | Status: DC

## 2022-07-23 MED ORDER — ALBUTEROL SULFATE 0.63 MG/3ML IN NEBU
1.0000 | INHALATION_SOLUTION | Freq: Four times a day (QID) | RESPIRATORY_TRACT | 3 refills | Status: DC | PRN
Start: 2022-07-23 — End: 2023-02-03

## 2022-08-10 ENCOUNTER — Other Ambulatory Visit (INDEPENDENT_AMBULATORY_CARE_PROVIDER_SITE_OTHER): Payer: Self-pay | Admitting: Nurse Practitioner

## 2022-08-10 DIAGNOSIS — G458 Other transient cerebral ischemic attacks and related syndromes: Secondary | ICD-10-CM

## 2022-08-11 ENCOUNTER — Ambulatory Visit (INDEPENDENT_AMBULATORY_CARE_PROVIDER_SITE_OTHER): Payer: Medicare PPO

## 2022-08-11 ENCOUNTER — Encounter (INDEPENDENT_AMBULATORY_CARE_PROVIDER_SITE_OTHER): Payer: Self-pay | Admitting: Vascular Surgery

## 2022-08-11 ENCOUNTER — Ambulatory Visit (INDEPENDENT_AMBULATORY_CARE_PROVIDER_SITE_OTHER): Payer: Medicare PPO | Admitting: Vascular Surgery

## 2022-08-11 VITALS — BP 169/74 | HR 101 | Resp 18

## 2022-08-11 DIAGNOSIS — I6523 Occlusion and stenosis of bilateral carotid arteries: Secondary | ICD-10-CM | POA: Diagnosis not present

## 2022-08-11 DIAGNOSIS — G458 Other transient cerebral ischemic attacks and related syndromes: Secondary | ICD-10-CM

## 2022-08-11 DIAGNOSIS — E782 Mixed hyperlipidemia: Secondary | ICD-10-CM

## 2022-08-11 DIAGNOSIS — I6529 Occlusion and stenosis of unspecified carotid artery: Secondary | ICD-10-CM | POA: Insufficient documentation

## 2022-08-11 DIAGNOSIS — I10 Essential (primary) hypertension: Secondary | ICD-10-CM

## 2022-08-11 DIAGNOSIS — E785 Hyperlipidemia, unspecified: Secondary | ICD-10-CM | POA: Diagnosis not present

## 2022-08-11 NOTE — Assessment & Plan Note (Signed)
lipid control important in reducing the progression of atherosclerotic disease. Continue statin therapy  

## 2022-08-11 NOTE — Assessment & Plan Note (Signed)
Duplex today show velocities that fall in the upper end of the 1 to 39% range on the right and in the 40 to 59% range on the left.  The left vertebral artery does demonstrate retrograde flow consistent with subclavian steal syndrome.  No worrisome symptoms and that her degree of stenosis no role for intervention.  Recheck in 1 year.

## 2022-08-11 NOTE — Assessment & Plan Note (Signed)
blood pressure control important in reducing the progression of atherosclerotic disease. On appropriate oral medications.  

## 2022-08-11 NOTE — Progress Notes (Signed)
MRN : [id number]  Lori Brady is a 76 y.o. ([birth year]) female who presents with chief complaint of No chief complaint on file. Marland Kitchen  History of Present Illness: Patient returns in follow of her carotid disease.  She says she is doing well today.  She has no focal neurologic symptoms or new complaints.  She does get dizziness when laying on her side.  Duplex today show velocities that fall in the upper end of the 1 to 39% range on the right and in the 40 to 59% range on the left.  The left vertebral artery does demonstrate retrograde flow consistent with subclavian steal syndrome.  Current Outpatient Medications  Medication Sig Dispense Refill   albuterol (ACCUNEB) 0.63 MG/3ML nebulizer solution Take 3 mLs (0.63 mg total) by nebulization every 6 (six) hours as needed for wheezing. 360 mL 3   albuterol (VENTOLIN HFA) 108 (90 Base) MCG/ACT inhaler INHALE 2 PUFFS INTO THE LUNGS EVERY 6 HOURS AS NEEDED FOR WHEEZING OR SHORTNESS OF BREATH 8.5 g 3   alendronate (FOSAMAX) 70 MG tablet Take 1 tablet (70 mg total) by mouth once a week. Take with a full glass of water on an empty stomach. 12 tablet 3   aspirin 81 MG EC tablet Take by mouth.     atorvastatin (LIPITOR) 40 MG tablet Take 1 tablet (40 mg total) by mouth at bedtime. 90 tablet 0   azelastine (ASTELIN) 0.1 % nasal spray Place 2 sprays into both nostrils daily at 6 (six) AM.     betamethasone valerate (VALISONE) 0.1 % cream APPLY TOPICALLY TO AFFECTED AREA SPARINGLY 2 TIMES DAILY. 15 g 5   BREO ELLIPTA 100-25 MCG/ACT AEPB INHALE 1 PUFF BY MOUTH ONCE DAILY AS DIRECTED 180 each 1   carbidopa-levodopa (SINEMET CR) 50-200 MG tablet Take 1 tablet by mouth daily. Before dose     carbidopa-levodopa (SINEMET CR) 50-200 MG tablet Take 1 tablet by mouth at bedtime.     carbidopa-levodopa (SINEMET IR) 25-100 MG tablet 3 pill in the morning, then 2 pills two times a day     cetirizine (ZYRTEC) 10 MG tablet TAKE 1 TABLET BY MOUTH ONCE DAILY 30 tablet 1    citalopram (CELEXA) 20 MG tablet TAKE 1 TABLET BY MOUTH ONCE DAILY 90 tablet 0   clobetasol (TEMOVATE) 0.05 % external solution Apply 1 application topically 2 (two) times daily. As needed     clobetasol cream (TEMOVATE) 8.67 % Apply 1 application topically 2 (two) times daily. As needed     doxepin (SINEQUAN) 25 MG capsule TAKE 1 CAPSULE BY MOUTH AT BEDTIME 30 capsule 5   fluticasone (FLONASE) 50 MCG/ACT nasal spray      ibuprofen (ADVIL) 200 MG tablet Take 200 mg by mouth every 6 (six) hours as needed.     ketoconazole (NIZORAL) 2 % shampoo Apply 1 application. topically 2 (two) times a week.     melatonin 5 MG TABS Take 1 tablet by mouth at bedtime.     meloxicam (MOBIC) 7.5 MG tablet TAKE 1 TABLET BY MOUTH ONCE DAILY 30 tablet 2   Meloxicam 10 MG CAPS      montelukast (SINGULAIR) 10 MG tablet TAKE 1 TABLET BY MOUTH ONCE EVERY EVENING 90 tablet 0   omeprazole (PRILOSEC) 40 MG capsule TAKE 1 CAPSULE BY MOUTH ONCE DAILY 90 capsule 0   simvastatin (ZOCOR) 40 MG tablet TAKE 1 TABLET BY MOUTH ONCE EVERY EVENING     Tiotropium Bromide Monohydrate (SPIRIVA RESPIMAT) 2.5  MCG/ACT AERS INHALE 2 PUFFS BY MOUTH ONCE DAILY FOR COPD 12 g 3   Cholecalciferol (VITAMIN D) 2000 UNITS CAPS Take 1 capsule by mouth daily. (Patient not taking: Reported on 08/11/2022)     predniSONE (STERAPRED UNI-PAK 48 TAB) 10 MG (48) TBPK tablet Use as directed for 12 days (Patient not taking: Reported on 08/11/2022) 48 tablet 0   sulfamethoxazole-trimethoprim (BACTRIM DS) 800-160 MG tablet Take 1 tablet by mouth 2 (two) times daily. (Patient not taking: Reported on 08/11/2022) 20 tablet 0   traMADol (ULTRAM) 50 MG tablet  (Patient not taking: Reported on 08/11/2022)     No current facility-administered medications for this visit.    Past Medical History:  Diagnosis Date   Arthritis    Asthma    Crohn's disease ([place])    Emphysema of lung ([place])    GERD (gastroesophageal reflux disease)    Hip fracture ([place]) 01/26/2017    History of chicken pox    History of heart attack    Hx of completed stroke    Hypercholesteremia    Hypertension    Parkinson disease ([place])    Pneumonia    Seasonal allergies    Sepsis ([place])    Septic shock ([place]) 10/08/2016   UTI (lower urinary tract infection)     Past Surgical History:  Procedure Laterality Date   BREAST BIOPSY Bilateral 1960's to 90's   breast cysts     CATARACT EXTRACTION, BILATERAL     COLONOSCOPY WITH PROPOFOL N/A 06/01/2022   Procedure: COLONOSCOPY WITH PROPOFOL;  Surgeon: [name], MD;  Location: [facility] ENDOSCOPY;  Service: Gastroenterology;  Laterality: N/A;   ELBOW SURGERY     right   right wrist fracture     TUBAL LIGATION     WRIST SURGERY Left      Social History   Tobacco Use   Smoking status: Former    Packs/day: 1.00    Years: 30.00    Total pack years: 30.00    Types: Cigarettes    Quit date: 08/01/2008    Years since quitting: 14.0   Smokeless tobacco: Never  Vaping Use   Vaping Use: Never used  Substance Use Topics   Alcohol use: Not Currently    Alcohol/week: 1.0 standard drink of alcohol    Types: 1 Shots of liquor per week   Drug use: No      Family History  Problem Relation Age of Onset   Cancer Sister        ovarian/uterine/breast   Breast cancer Sister 72   Cancer Sister        breast   Heart disease Sister    Breast cancer Sister 33   Heart disease Father    Cancer Father        liver   Pancreatic cancer Mother    Heart disease Brother    Heart attack Brother    Cancer Brother        liver   Heart disease Sister    Diabetes Maternal Grandmother      Allergies  Allergen Reactions   Levofloxacin Nausea Only and Nausea And Vomiting    Can take if given something for n/v   Vicodin [Hydrocodone-Acetaminophen] Nausea Only     REVIEW OF SYSTEMS (Negative unless checked)  Constitutional: [] Weight loss  [] Fever  [] Chills Cardiac: [] Chest pain   [] Chest pressure   [] Palpitations   [] Shortness of breath  when laying flat   [] Shortness of breath at rest   []   Shortness of breath with exertion. Vascular:  [] Pain in legs with walking   [] Pain in legs at rest   [] Pain in legs when laying flat   [] Claudication   [] Pain in feet when walking  [] Pain in feet at rest  [] Pain in feet when laying flat   [] History of DVT   [] Phlebitis   [] Swelling in legs   [] Varicose veins   [] Non-healing ulcers Pulmonary:   [] Uses home oxygen   [] Productive cough   [] Hemoptysis   [] Wheeze  [x] COPD   [] Asthma Neurologic:  [x] Dizziness  [] Blackouts   [] Seizures   [] History of stroke   [] History of TIA  [] Aphasia   [] Temporary blindness   [] Dysphagia   [] Weakness or numbness in arms   [] Weakness or numbness in legs Musculoskeletal:  [x] Arthritis   [] Joint swelling   [x] Joint pain   [] Low back pain Hematologic:  [] Easy bruising  [] Easy bleeding   [] Hypercoagulable state   [x] Anemic  [] Hepatitis Gastrointestinal:  [] Blood in stool   [] Vomiting blood  [x] Gastroesophageal reflux/heartburn   [] Difficulty swallowing. Genitourinary:  [] Chronic kidney disease   [] Difficult urination  [] Frequent urination  [] Burning with urination   [] Blood in urine Skin:  [] Rashes   [] Ulcers   [] Wounds Psychological:  [] History of anxiety   []  History of major depression.  Physical Examination  Vitals:   08/11/22 1431  BP: (!) 169/74  Pulse: (!) 101  Resp: 18   There is no height or weight on file to calculate BMI. Gen:  WD/WN, NAD Head: [place]/AT, No temporalis wasting. Ear/Nose/Throat: Hearing grossly intact, nares w/o erythema or drainage, trachea midline Eyes: Conjunctiva clear. Sclera non-icteric Neck: Supple.  No bruit  Pulmonary:  Good air movement, equal and clear to auscultation bilaterally.  Cardiac: RRR, No JVD Vascular:  Vessel Right Left  Radial 2+ Palpable Trace Palpable           Musculoskeletal: M/S 5/5 throughout.  No deformity or atrophy. In a wheelchair Neurologic: CN 2-12 intact. Sensation grossly intact in extremities.   Symmetrical.  Speech is fluent. Motor exam as listed above. Psychiatric: Judgment intact, Mood & affect appropriate for pt's clinical situation. Dermatologic: No rashes or ulcers noted.  No cellulitis or open wounds.    CBC Lab Results  Component Value Date   WBC 10.6 12/15/2021   HGB 10.5 (L) 12/15/2021   HCT 33.7 (L) 12/15/2021   MCV 84 12/15/2021   PLT 234 12/15/2021    BMET    Component Value Date/Time   NA 139 12/15/2021 1434   NA 136 03/10/2015 0535   K 4.9 12/15/2021 1434   K 3.6 03/10/2015 0535   CL 101 12/15/2021 1434   CL 103 03/10/2015 0535   CO2 24 12/15/2021 1434   CO2 26 03/10/2015 0535   GLUCOSE 105 (H) 12/15/2021 1434   GLUCOSE 155 (H) 07/18/2018 0526   GLUCOSE 117 (H) 03/10/2015 0535   BUN 14 12/15/2021 1434   BUN 13 03/10/2015 0535   CREATININE 0.64 12/15/2021 1434   CREATININE 0.79 03/10/2015 0535   CALCIUM 9.7 12/15/2021 1434   CALCIUM 8.6 (L) 03/10/2015 0535   GFRNONAA 72 07/25/2020 1532   GFRNONAA >60 03/10/2015 0535   GFRAA 83 07/25/2020 1532   GFRAA >60 03/10/2015 0535   CrCl cannot be calculated (Patient's most recent lab result is older than the maximum 21 days allowed.).  COAG Lab Results  Component Value Date   INR 1.13 01/26/2017   INR 1.02 05/28/2016   INR 1.0 04/26/2014  Radiology No results found.   Assessment/Plan Carotid artery stenosis Duplex today show velocities that fall in the upper end of the 1 to 39% range on the right and in the 40 to 59% range on the left.  The left vertebral artery does demonstrate retrograde flow consistent with subclavian steal syndrome.  No worrisome symptoms and that her degree of stenosis no role for intervention.  Recheck in 1 year.  Subclavian steal syndrome of left subclavian artery Duplex today show velocities that fall in the upper end of the 1 to 39% range on the right and in the 40 to 59% range on the left.  The left vertebral artery does demonstrate retrograde flow consistent with  subclavian steal syndrome.  No role for intervention at this time without more worrisome symptoms.  Recheck with carotid duplex in 1 year.  Benign essential HTN blood pressure control important in reducing the progression of atherosclerotic disease. On appropriate oral medications.   Mixed hyperlipidemia lipid control important in reducing the progression of atherosclerotic disease. Continue statin therapy    Leotis Pain, MD  08/11/2022 3:07 PM    This note was created with Dragon medical transcription system.  Any errors from dictation are purely unintentional

## 2022-08-11 NOTE — Assessment & Plan Note (Signed)
Duplex today show velocities that fall in the upper end of the 1 to 39% range on the right and in the 40 to 59% range on the left.  The left vertebral artery does demonstrate retrograde flow consistent with subclavian steal syndrome.  No role for intervention at this time without more worrisome symptoms.  Recheck with carotid duplex in 1 year.

## 2022-08-12 DIAGNOSIS — G4752 REM sleep behavior disorder: Secondary | ICD-10-CM | POA: Diagnosis not present

## 2022-08-12 DIAGNOSIS — Z8673 Personal history of transient ischemic attack (TIA), and cerebral infarction without residual deficits: Secondary | ICD-10-CM | POA: Diagnosis not present

## 2022-08-12 DIAGNOSIS — G20 Parkinson's disease: Secondary | ICD-10-CM | POA: Diagnosis not present

## 2022-08-12 DIAGNOSIS — G458 Other transient cerebral ischemic attacks and related syndromes: Secondary | ICD-10-CM | POA: Diagnosis not present

## 2022-08-20 ENCOUNTER — Ambulatory Visit
Admission: RE | Admit: 2022-08-20 | Discharge: 2022-08-20 | Disposition: A | Discharge status: Home / Self Care | Payer: Medicare PPO | Source: Ambulatory Visit | Attending: Family Medicine | Admitting: Family Medicine

## 2022-08-20 DIAGNOSIS — Z1231 Encounter for screening mammogram for malignant neoplasm of breast: Secondary | ICD-10-CM | POA: Diagnosis not present

## 2022-09-28 ENCOUNTER — Other Ambulatory Visit: Payer: Self-pay | Admitting: Family Medicine

## 2022-09-28 DIAGNOSIS — F419 Anxiety disorder, unspecified: Secondary | ICD-10-CM

## 2022-09-28 DIAGNOSIS — D649 Anemia, unspecified: Secondary | ICD-10-CM

## 2022-09-29 ENCOUNTER — Ambulatory Visit: Payer: Medicare PPO | Admitting: Internal Medicine

## 2022-09-29 ENCOUNTER — Encounter: Payer: Self-pay | Admitting: Internal Medicine

## 2022-09-29 VITALS — BP 140/78 | HR 100 | Temp 97.8°F | Resp 16 | Ht 61.0 in | Wt 125.0 lb

## 2022-09-29 DIAGNOSIS — G20C Parkinsonism, unspecified: Secondary | ICD-10-CM

## 2022-09-29 DIAGNOSIS — J4489 Other specified chronic obstructive pulmonary disease: Secondary | ICD-10-CM

## 2022-09-29 DIAGNOSIS — K219 Gastro-esophageal reflux disease without esophagitis: Secondary | ICD-10-CM | POA: Diagnosis not present

## 2022-09-29 NOTE — Patient Instructions (Signed)
Chronic Obstructive Pulmonary Disease  Chronic obstructive pulmonary disease (COPD) is a long-term (chronic) lung problem. When you have COPD, it is hard for air to get in and out of your lungs. Usually the condition gets worse over time, and your lungs will never return to normal. There are things you can do to keep yourself as healthy as possible. What are the causes? Smoking. This is the most common cause. Certain genes passed from parent to child (inherited). What increases the risk? Being exposed to secondhand smoke from cigarettes, pipes, or cigars. Being exposed to chemicals and other irritants, such as fumes and dust in the work environment. Having chronic lung conditions or infections. What are the signs or symptoms? Shortness of breath, especially during physical activity. A long-term cough with a large amount of thick mucus. Sometimes, the cough may not have any mucus (dry cough). Wheezing. Breathing quickly. Skin that looks gray or blue, especially in the fingers, toes, or lips. Feeling tired (fatigue). Weight loss. Chest tightness. Having infections often. Episodes when breathing symptoms become much worse (exacerbations). At the later stages of this disease, you may have swelling in the ankles, feet, or legs. How is this treated? Taking medicines. Quitting smoking, if you smoke. Rehabilitation. This includes steps to make your body work better. It may involve a team of specialists. Doing exercises. Making changes to your diet. Using oxygen. Lung surgery. Lung transplant. Comfort measures (palliative care). Follow these instructions at home: Medicines Take over-the-counter and prescription medicines only as told by your doctor. Talk to your doctor before taking any cough or allergy medicines. You may need to avoid medicines that cause your lungs to be dry. Lifestyle If you smoke, stop smoking. Smoking makes the problem worse. Do not smoke or use any products that  contain nicotine or tobacco. If you need help quitting, ask your doctor. Avoid being around things that make your breathing worse. This may include smoke, chemicals, and fumes. Stay active, but remember to rest as well. Learn and use tips on how to manage stress and control your breathing. Make sure you get enough sleep. Most adults need at least 7 hours of sleep every night. Eat healthy foods. Eat smaller meals more often. Rest before meals. Controlled breathing Learn and use tips on how to control your breathing as told by your doctor. Try: Breathing in (inhaling) through your nose for 1 second. Then, pucker your lips and breath out (exhale) through your lips for 2 seconds. Putting one hand on your belly (abdomen). Breathe in slowly through your nose for 1 second. Your hand on your belly should move out. Pucker your lips and breathe out slowly through your lips. Your hand on your belly should move in as you breathe out.  Controlled coughing Learn and use controlled coughing to clear mucus from your lungs. Follow these steps: Lean your head a little forward. Breathe in deeply. Try to hold your breath for 3 seconds. Keep your mouth slightly open while coughing 2 times. Spit any mucus out into a tissue. Rest and do the steps again 1 or 2 times as needed. General instructions Make sure you get all the shots (vaccines) that your doctor recommends. Ask your doctor about a flu shot and a pneumonia shot. Use oxygen therapy and pulmonary rehabilitation if told by your doctor. If you need home oxygen therapy, ask your doctor if you should buy a tool to measure your oxygen level (oximeter). Make a COPD action plan with your doctor. This helps you   to know what to do if you feel worse than usual. Manage any other conditions you have as told by your doctor. Avoid going outside when it is very hot, cold, or humid. Avoid people who have a sickness you can catch (contagious). Keep all follow-up  visits. Contact a doctor if: You cough up more mucus than usual. There is a change in the color or thickness of the mucus. It is harder to breathe than usual. Your breathing is faster than usual. You have trouble sleeping. You need to use your medicines more often than usual. You have trouble doing your normal activities such as getting dressed or walking around the house. Get help right away if: You have shortness of breath while resting. You have shortness of breath that stops you from: Being able to talk. Doing normal activities. Your chest hurts for longer than 5 minutes. Your skin color is more blue than usual. Your pulse oximeter shows that you have low oxygen for longer than 5 minutes. You have a fever. You feel too tired to breathe normally. These symptoms may represent a serious problem that is an emergency. Do not wait to see if the symptoms will go away. Get medical help right away. Call your local emergency services (911 in the [place] Do not drive yourself to the hospital. Summary Chronic obstructive pulmonary disease (COPD) is a long-term lung problem. The way your lungs work will never return to normal. Usually the condition gets worse over time. There are things you can do to keep yourself as healthy as possible. Take over-the-counter and prescription medicines only as told by your doctor. If you smoke, stop. Smoking makes the problem worse. This information is not intended to replace advice given to you by your health care provider. Make sure you discuss any questions you have with your health care provider. Document Revised: 09/10/2020 Document Reviewed: 09/10/2020 Elsevier Patient Education  2023 Elsevier Inc.  

## 2022-09-29 NOTE — Progress Notes (Signed)
[facility] [place], [place] [address]  Pulmonary Sleep Medicine   Office Visit Note  Patient Name: Lori Brady DOB: [birth year] MRN [id number]  Date of Service: 09/29/2022  Complaints/HPI: She is feeling better. She is now using the rescue inhaler about 3x daily. She feels less SOB. She has a cough but is now at her baseline. She denies having chest pain no fevers or chills. No chest pain. She does have SOB with exertion  ROS  General: (-) fever, (-) chills, (-) night sweats, (-) weakness Skin: (-) rashes, (-) itching,. Eyes: (-) visual changes, (-) redness, (-) itching. Nose and Sinuses: (-) nasal stuffiness or itchiness, (-) postnasal drip, (-) nosebleeds, (-) sinus trouble. Mouth and Throat: (-) sore throat, (-) hoarseness. Neck: (-) swollen glands, (-) enlarged thyroid, (-) neck pain. Respiratory: + cough, (-) bloody sputum, + shortness of breath, - wheezing. Cardiovascular: - ankle swelling, (-) chest pain. Lymphatic: (-) lymph node enlargement. Neurologic: (-) numbness, (-) tingling. Psychiatric: (-) anxiety, (-) depression   Current Medication: Outpatient Encounter Medications as of 09/29/2022  Medication Sig Note   albuterol (ACCUNEB) 0.63 MG/3ML nebulizer solution Take 3 mLs (0.63 mg total) by nebulization every 6 (six) hours as needed for wheezing.    albuterol (VENTOLIN HFA) 108 (90 Base) MCG/ACT inhaler INHALE 2 PUFFS INTO THE LUNGS EVERY 6 HOURS AS NEEDED FOR WHEEZING OR SHORTNESS OF BREATH    alendronate (FOSAMAX) 70 MG tablet Take 1 tablet (70 mg total) by mouth once a week. Take with a full glass of water on an empty stomach.    aspirin 81 MG EC tablet Take by mouth.    atorvastatin (LIPITOR) 40 MG tablet Take 1 tablet (40 mg total) by mouth at bedtime.    azelastine (ASTELIN) 0.1 % nasal spray Place 2 sprays into both nostrils daily at 6 (six) AM.    betamethasone valerate (VALISONE) 0.1 % cream APPLY TOPICALLY TO AFFECTED AREA  SPARINGLY 2 TIMES DAILY. 01/23/2021: As needed   BREO ELLIPTA 100-25 MCG/ACT AEPB INHALE 1 PUFF BY MOUTH ONCE DAILY AS DIRECTED    carbidopa-levodopa (SINEMET CR) 50-200 MG tablet Take 1 tablet by mouth daily. Before dose    carbidopa-levodopa (SINEMET CR) 50-200 MG tablet Take 1 tablet by mouth at bedtime.    carbidopa-levodopa (SINEMET IR) 25-100 MG tablet 3 pill in the morning, then 2 pills two times a day    cetirizine (ZYRTEC) 10 MG tablet TAKE 1 TABLET BY MOUTH ONCE DAILY    Cholecalciferol (VITAMIN D) 2000 UNITS CAPS Take 1 capsule by mouth daily.    citalopram (CELEXA) 20 MG tablet TAKE 1 TABLET BY MOUTH ONCE DAILY    clobetasol (TEMOVATE) 0.05 % external solution Apply 1 application topically 2 (two) times daily. As needed    clobetasol cream (TEMOVATE) 8.52 % Apply 1 application topically 2 (two) times daily. As needed    doxepin (SINEQUAN) 25 MG capsule TAKE 1 CAPSULE BY MOUTH AT BEDTIME    fluticasone (FLONASE) 50 MCG/ACT nasal spray     ibuprofen (ADVIL) 200 MG tablet Take 200 mg by mouth every 6 (six) hours as needed.    ketoconazole (NIZORAL) 2 % shampoo Apply 1 application. topically 2 (two) times a week.    melatonin 5 MG TABS Take 1 tablet by mouth at bedtime.    meloxicam (MOBIC) 7.5 MG tablet TAKE 1 TABLET BY MOUTH ONCE DAILY    Meloxicam 10 MG CAPS     montelukast (SINGULAIR) 10 MG  tablet TAKE 1 TABLET BY MOUTH ONCE EVERY EVENING    omeprazole (PRILOSEC) 40 MG capsule TAKE 1 CAPSULE BY MOUTH ONCE DAILY    simvastatin (ZOCOR) 40 MG tablet TAKE 1 TABLET BY MOUTH ONCE EVERY EVENING    Tiotropium Bromide Monohydrate (SPIRIVA RESPIMAT) 2.5 MCG/ACT AERS INHALE 2 PUFFS BY MOUTH ONCE DAILY FOR COPD    traMADol (ULTRAM) 50 MG tablet     [DISCONTINUED] sulfamethoxazole-trimethoprim (BACTRIM DS) 800-160 MG tablet Take 1 tablet by mouth 2 (two) times daily.    [DISCONTINUED] predniSONE (STERAPRED UNI-PAK 48 TAB) 10 MG (48) TBPK tablet Use as directed for 12 days (Patient not taking:  Reported on 09/29/2022)    No facility-administered encounter medications on file as of 09/29/2022.    Surgical History: Past Surgical History:  Procedure Laterality Date   BREAST BIOPSY Bilateral 1960's to 90's   breast cysts     CATARACT EXTRACTION, BILATERAL     COLONOSCOPY WITH PROPOFOL N/A 06/01/2022   Procedure: COLONOSCOPY WITH PROPOFOL;  Surgeon: [name], MD;  Location[facility] ENDOSCOPY;  Service: Gastroenterology;  Laterality: N/A;   ELBOW SURGERY     right   right wrist fracture     TUBAL LIGATION     WRIST SURGERY Left     Medical History: Past Medical History:  Diagnosis Date   Arthritis    Asthma    Crohn's disease ([place])    Emphysema of lung (HCC)    GERD (gastroesophageal reflux disease)    Hip fracture ([place]) 01/26/2017   History of chicken pox    History of heart attack    Hx of completed stroke    Hypercholesteremia    Hypertension    Parkinson disease    Pneumonia    Seasonal allergies    Sepsis ([place])    Septic shock ([place]) 10/08/2016   UTI (lower urinary tract infection)     Family History: Family History  Problem Relation Age of Onset   Cancer Sister        ovarian/uterine/breast   Breast cancer Sister 58   Cancer Sister        breast   Heart disease Sister    Breast cancer Sister 69   Heart disease Father    Cancer Father        liver   Pancreatic cancer Mother    Heart disease Brother    Heart attack Brother    Cancer Brother        liver   Heart disease Sister    Diabetes Maternal Grandmother     Social History: Social History   Socioeconomic History   Marital status: Widowed    Spouse name: Not on file   Number of children: 3   Years of education: Not on file   Highest education level: Some college, no degree  Occupational History   Occupation: retired  Tobacco Use   Smoking status: Former    Packs/day: 1.00    Years: 30.00    Total pack years: 30.00    Types: Cigarettes    Quit date: 08/01/2008    Years since  quitting: 14.1   Smokeless tobacco: Never  Vaping Use   Vaping Use: Never used  Substance and Sexual Activity   Alcohol use: Not Currently    Alcohol/week: 1.0 standard drink of alcohol    Types: 1 Shots of liquor per week   Drug use: No   Sexual activity: Not on file  Other Topics Concern   Not on  file  Social History Narrative   Not on file   Social Determinants of Health   Financial Resource Strain: Low Risk  (01/29/2022)   Overall Financial Resource Strain (CARDIA)    Difficulty of Paying Living Expenses: Not hard at all  Food Insecurity: No Food Insecurity (01/29/2022)   Hunger Vital Sign    Worried About Running Out of Food in the Last Year: Never true    Ran Out of Food in the Last Year: Never true  Transportation Needs: No Transportation Needs (01/29/2022)   PRAPARE - Hydrologist (Medical): No    Lack of Transportation (Non-Medical): No  Physical Activity: Insufficiently Active (01/29/2022)   Exercise Vital Sign    Days of Exercise per Week: 3 days    Minutes of Exercise per Session: 20 min  Stress: No Stress Concern Present (01/29/2022)   [place]    Feeling of Stress : Not at all  Social Connections: Socially Isolated (01/29/2022)   Social Connection and Isolation Panel [NHANES]    Frequency of Communication with Friends and Family: More than three times a week    Frequency of Social Gatherings with Friends and Family: Twice a week    Attends Religious Services: Never    Marine scientist or Organizations: No    Attends Archivist Meetings: Never    Marital Status: Widowed  Intimate Partner Violence: Not At Risk (01/29/2022)   Humiliation, Afraid, Rape, and Kick questionnaire    Fear of Current or Ex-Partner: No    Emotionally Abused: No    Physically Abused: No    Sexually Abused: No    Vital Signs: Blood pressure (!) 140/78, pulse 100, temperature  97.8 F (36.6 C), resp. rate 16, height 5' 1"  (1.549 m), weight 125 lb (56.7 kg), SpO2 98 %.  Examination: General Appearance: The patient is well-developed, well-nourished, and in no distress. Skin: Gross inspection of skin unremarkable. Head: normocephalic, no gross deformities. Eyes: no gross deformities noted. ENT: ears appear grossly normal no exudates. Neck: Supple. No thyromegaly. No LAD. Respiratory: fewrhonchi noted. Cardiovascular: Normal S1 and S2 without murmur or rub. Extremities: No cyanosis. pulses are equal. Neurologic: Alert and oriented. No involuntary movements.  LABS: No results found for this or any previous visit (from the past 2160 hour(s)).  Radiology: MM 3D SCREEN BREAST BILATERAL  Result Date: 08/21/2022 CLINICAL DATA:  Screening. EXAM: DIGITAL SCREENING BILATERAL MAMMOGRAM WITH TOMOSYNTHESIS AND CAD TECHNIQUE: Bilateral screening digital craniocaudal and mediolateral oblique mammograms were obtained. Bilateral screening digital breast tomosynthesis was performed. The images were evaluated with computer-aided detection. COMPARISON:  Previous exam(s). ACR Breast Density Category b: There are scattered areas of fibroglandular density. FINDINGS: There are no findings suspicious for malignancy. IMPRESSION: No mammographic evidence of malignancy. A result letter of this screening mammogram will be mailed directly to the patient. RECOMMENDATION: Screening mammogram in one year. (Code:SM-B-01Y) BI-RADS CATEGORY  1: Negative. Electronically Signed   By: Audie Pinto M.D.   On: 08/21/2022 12:50    No results found.  No results found.    Assessment and Plan: Patient Active Problem List   Diagnosis Date Noted   Carotid artery stenosis 08/11/2022   Low blood pressure 01/28/2022   Subclavian steal syndrome of left subclavian artery 01/16/2022   Status post hardware removal 11/20/2021   Periprosthetic fracture around internal prosthetic right elbow joint  07/25/2020   Pain in joint of right elbow 07/19/2020  Closed fracture of distal end of left radius 10/27/2019   Closed Monteggia's fracture of right arm 09/24/2019   Crohn's disease of colon without complication ([place]) 45/01/8881   Hearing loss 06/01/2019   Vertigo 06/01/2019   Hypokalemia 07/22/2018   Oral thrush 07/22/2018   Gastroenteritis 07/16/2018   Primary parkinsonism 01/05/2018   SIADH (syndrome of inappropriate ADH production) ([place]) 10/01/2017   Spinal stenosis 10/01/2017   Elevated troponin I level 11/18/2016   COPD (chronic obstructive pulmonary disease) with chronic bronchitis 08/12/2016   Asthma without status asthmaticus 03/31/2016   Osteoporosis 03/31/2016   Fatty liver determined by biopsy 09/17/2015   Parotid swelling 09/13/2015   Urticaria 09/13/2015   History of CVA (cerebrovascular accident) 08/21/2015   Senile purpura ([place]) 08/21/2015   Atrial tachycardia 08/13/2015   Eczema 07/10/2015   Allergic rhinitis 06/26/2015   Anxiety 04/23/2015   Edema leg 03/27/2015   Generalized weakness 03/06/2015   Primary cardiomyopathy ([place]) 03/05/2015   CAD (coronary artery disease) 03/05/2015   Benign essential HTN 02/28/2015   Combined fat and carbohydrate induced hyperlipemia 09/06/2014   Mixed hyperlipidemia 09/06/2014   Acid reflux 08/01/2014   Gastro-esophageal reflux disease without esophagitis 08/01/2014    1. COPD (chronic obstructive pulmonary disease) with chronic bronchitis Improved but at baseline. She is on her inhalers as prescribed  2. Primary parkinsonism Supportive care follow with Neuro  3. Gastroesophageal reflux disease without esophagitis Discussed aspiration precautions with her in detail   General Counseling: I have discussed the findings of the evaluation and examination with Christal.  I have also discussed any further diagnostic evaluation thatmay be needed or ordered today. Jenah verbalizes understanding of the findings of todays visit. We  also reviewed her medications today and discussed drug interactions and side effects including but not limited excessive drowsiness and altered mental states. We also discussed that there is always a risk not just to her but also people around her. she has been encouraged to call the office with any questions or concerns that should arise related to todays visit.  No orders of the defined types were placed in this encounter.    Time spent: 8  I have personally obtained a history, examined the patient, evaluated laboratory and imaging results, formulated the assessment and plan and placed orders.    Allyne Gee, MD [facility] Pulmonary and Critical Care Sleep medicine

## 2022-10-12 ENCOUNTER — Other Ambulatory Visit: Payer: Self-pay | Admitting: Family Medicine

## 2022-10-12 ENCOUNTER — Other Ambulatory Visit: Payer: Self-pay | Admitting: Internal Medicine

## 2022-10-13 NOTE — Telephone Encounter (Signed)
Rx 07/21/22 #30 5RF- too soon Requested Prescriptions  Pending Prescriptions Disp Refills   doxepin (SINEQUAN) 25 MG capsule [Pharmacy Med Name: DOXEPIN HCL 25 MG CAP] 30 capsule 5    Sig: TAKE 1 CAPSULE BY MOUTH AT BEDTIME     Psychiatry:  Antidepressants - Heterocyclics (TCAs) Failed - 10/13/2022  1:57 PM      Failed - Valid encounter within last 6 months    Recent Outpatient Visits           6 months ago Encounter for annual physical exam   [facility] [place], Dionne Bucy, MD   10 months ago Benign essential HTN   [facility] [place], Dionne Bucy, MD   1 year ago Encounter for annual physical exam   [facility] [place], Dionne Bucy, MD   2 years ago Benign essential HTN   [facility] [place], Dionne Bucy, MD   2 years ago Encounter for annual physical exam   [facility] [place], Dionne Bucy, MD       Future Appointments             In 1 week [place], Dionne Bucy, MD [facility]           Psychiatry:  Antidepressants - Heterocyclics (TCAs) - doxepin Passed - 10/13/2022  1:57 PM      Passed - Valid encounter within last 12 months    Recent Outpatient Visits           6 months ago Encounter for annual physical exam   [facility], Dionne Bucy, MD   10 months ago Benign essential HTN   [facility] [place], Dionne Bucy, MD   1 year ago Encounter for annual physical exam   [facility] [place], Dionne Bucy, MD   2 years ago Benign essential HTN   [facility] [place], Dionne Bucy, MD   2 years ago Encounter for annual physical exam   [facility] [place], Dionne Bucy, MD       Future Appointments             In 1 week Bacigalupo, Dionne Bucy, MD [facility], [place]

## 2022-10-13 NOTE — Telephone Encounter (Signed)
Requested Prescriptions  Pending Prescriptions Disp Refills   montelukast (SINGULAIR) 10 MG tablet [Pharmacy Med Name: MONTELUKAST SODIUM 10 MG TAB] 90 tablet 1    Sig: TAKE 1 TABLET BY MOUTH ONCE EVERY EVENING     Pulmonology:  Leukotriene Inhibitors Passed - 10/12/2022 11:17 AM      Passed - Valid encounter within last 12 months    Recent Outpatient Visits           6 months ago Encounter for annual physical exam   [facility] [place], Dionne Bucy, MD   10 months ago Benign essential HTN   [facility] [place], Dionne Bucy, MD   1 year ago Encounter for annual physical exam   [facility] [place], Dionne Bucy, MD   2 years ago Benign essential HTN   [facility] [place], Dionne Bucy, MD   2 years ago Encounter for annual physical exam   [facility] [place], Dionne Bucy, MD       Future Appointments             In 1 week [place], Dionne Bucy, MD [facility], [place]

## 2022-10-15 ENCOUNTER — Ambulatory Visit: Payer: Medicare PPO | Admitting: Family Medicine

## 2022-10-26 ENCOUNTER — Ambulatory Visit: Payer: Medicare PPO | Admitting: Family Medicine

## 2022-10-26 ENCOUNTER — Encounter: Payer: Self-pay | Admitting: Family Medicine

## 2022-10-26 VITALS — BP 135/67 | HR 87 | Temp 98.7°F | Resp 16 | Wt 126.3 lb

## 2022-10-26 DIAGNOSIS — D692 Other nonthrombocytopenic purpura: Secondary | ICD-10-CM

## 2022-10-26 DIAGNOSIS — I10 Essential (primary) hypertension: Secondary | ICD-10-CM

## 2022-10-26 DIAGNOSIS — D649 Anemia, unspecified: Secondary | ICD-10-CM | POA: Diagnosis not present

## 2022-10-26 DIAGNOSIS — E7800 Pure hypercholesterolemia, unspecified: Secondary | ICD-10-CM | POA: Diagnosis not present

## 2022-10-26 DIAGNOSIS — R739 Hyperglycemia, unspecified: Secondary | ICD-10-CM

## 2022-10-26 DIAGNOSIS — Z23 Encounter for immunization: Secondary | ICD-10-CM

## 2022-10-26 NOTE — Progress Notes (Signed)
I,Sulibeya S Dimas,acting as a scribe for [name], MD.,have documented all relevant documentation on the behalf of [name], MD,as directed by  [name], MD while in the presence of [name], MD.     Established patient visit   Patient: Lori Brady   DOB: 30-Aug-1946   76 y.o. Female  MRN: [id number] Visit Date: 10/26/2022  Today's healthcare provider: [name], MD   Chief Complaint  Patient presents with   Hypertension   Hyperlipidemia   Subjective    HPI  Hypertension, follow-up  BP Readings from Last 3 Encounters:  10/26/22 135/67  09/29/22 (!) 140/78  08/11/22 (!) 169/74   Wt Readings from Last 3 Encounters:  10/26/22 126 lb 4.8 oz (57.3 kg)  09/29/22 125 lb (56.7 kg)  07/21/22 130 lb (59 kg)     She was last seen for hypertension 6 months ago.  BP at that visit was 102/71. Management since that visit includes no changes.  She reports excellent compliance with treatment. She is not having side effects.   Use of agents associated with hypertension: none.   Outside blood pressures are stable.  Pertinent labs Lab Results  Component Value Date   CHOL 126 12/15/2021   HDL 32 (L) 12/15/2021   LDLCALC 67 12/15/2021   TRIG 154 (H) 12/15/2021   CHOLHDL 3.9 12/15/2021   Lab Results  Component Value Date   NA 139 12/15/2021   K 4.9 12/15/2021   CREATININE 0.64 12/15/2021   EGFR 93 12/15/2021   GLUCOSE 105 (H) 12/15/2021   TSH 1.580 01/04/2018     The ASCVD Risk score (Arnett DK, et al., 2019) failed to calculate for the following reasons:   The patient has a prior MI or stroke diagnosis  --------------------------------------------------------------------------------------------------- Lipid/Cholesterol,  Follow-up  Last lipid panel Other pertinent labs  Lab Results  Component Value Date   CHOL 126 12/15/2021   HDL 32 (L) 12/15/2021   LDLCALC 67 12/15/2021   TRIG 154 (H) 12/15/2021   CHOLHDL 3.9 12/15/2021   Lab Results  Component Value Date   ALT 6 12/15/2021   AST 28 12/15/2021   PLT 234 12/15/2021   TSH 1.580 01/04/2018     She was last seen for this 6 months ago.  Management since that visit includes no changes.  She reports excellent compliance with treatment. She is not having side effects.   The ASCVD Risk score (Arnett DK, et al., 2019) failed to calculate for the following reasons:   The patient has a prior MI or stroke diagnosis  --------------------------------------------------------------------------------------------------- Declines lung cancer screening at this time  Medications: Outpatient Medications Prior to Visit  Medication Sig   albuterol (ACCUNEB) 0.63 MG/3ML nebulizer solution Take 3 mLs (0.63 mg total) by nebulization every 6 (six) hours as needed for wheezing.   albuterol (VENTOLIN HFA) 108 (90 Base) MCG/ACT inhaler INHALE 2 PUFFS INTO THE LUNGS EVERY 6 HOURS AS NEEDED FOR WHEEZING OR SHORTNESS OF BREATH   alendronate (FOSAMAX) 70 MG tablet Take 1 tablet (70 mg total) by mouth once a week. Take with a full glass of water on an empty stomach.   aspirin 81 MG EC tablet Take by mouth.   atorvastatin (LIPITOR) 40 MG tablet Take 1 tablet (40 mg total) by mouth at bedtime.   azelastine (ASTELIN) 0.1 % nasal spray Place 2 sprays into both nostrils daily at 6 (six) AM.   betamethasone valerate (VALISONE) 0.1 % cream APPLY TOPICALLY TO AFFECTED AREA SPARINGLY 2  TIMES DAILY.   BREO ELLIPTA 100-25 MCG/ACT AEPB INHALE 1 PUFF BY MOUTH ONCE DAILY AS DIRECTED   carbidopa-levodopa (SINEMET CR) 50-200 MG tablet Take 1 tablet by mouth daily. Before dose   carbidopa-levodopa (SINEMET CR) 50-200 MG tablet Take 1 tablet by mouth at bedtime.   carbidopa-levodopa (SINEMET  IR) 25-100 MG tablet 3 pill in the morning, then 2 pills two times a day   cetirizine (ZYRTEC) 10 MG tablet TAKE 1 TABLET BY MOUTH ONCE DAILY   Cholecalciferol (VITAMIN D) 2000 UNITS CAPS Take 1 capsule by mouth daily.   citalopram (CELEXA) 20 MG tablet TAKE 1 TABLET BY MOUTH ONCE DAILY   clobetasol (TEMOVATE) 0.05 % external solution Apply 1 application topically 2 (two) times daily. As needed   clobetasol cream (TEMOVATE) 8.52 % Apply 1 application topically 2 (two) times daily. As needed   doxepin (SINEQUAN) 25 MG capsule TAKE 1 CAPSULE BY MOUTH AT BEDTIME   fluticasone (FLONASE) 50 MCG/ACT nasal spray    ibuprofen (ADVIL) 200 MG tablet Take 200 mg by mouth every 6 (six) hours as needed.   ketoconazole (NIZORAL) 2 % shampoo Apply 1 application. topically 2 (two) times a week.   melatonin 5 MG TABS Take 1 tablet by mouth at bedtime.   meloxicam (MOBIC) 7.5 MG tablet TAKE 1 TABLET BY MOUTH ONCE DAILY   Meloxicam 10 MG CAPS    montelukast (SINGULAIR) 10 MG tablet TAKE 1 TABLET BY MOUTH ONCE EVERY EVENING   omeprazole (PRILOSEC) 40 MG capsule TAKE 1 CAPSULE BY MOUTH ONCE DAILY   simvastatin (ZOCOR) 40 MG tablet TAKE 1 TABLET BY MOUTH ONCE EVERY EVENING   SPIRIVA RESPIMAT 2.5 MCG/ACT AERS INHALE 2 PUFFS BY MOUTH ONCE DAILY FOR COPD   traMADol (ULTRAM) 50 MG tablet    No facility-administered medications prior to visit.    Review of Systems  Constitutional:  Negative for appetite change and fatigue.  Eyes:  Negative for visual disturbance.  Respiratory:  Negative for chest tightness and shortness of breath.   Cardiovascular:  Negative for chest pain and leg swelling.  Gastrointestinal:  Negative for abdominal pain, nausea and vomiting.  Neurological:  Positive for dizziness. Negative for light-headedness and headaches.       Objective    BP 135/67 (BP Location: Right Arm, Patient Position: Sitting, Cuff Size: Normal)   Pulse 87   Temp 98.7 F (37.1 C) (Oral)   Resp 16   Wt 126 lb  4.8 oz (57.3 kg)   BMI 23.86 kg/m  BP Readings from Last 3 Encounters:  10/26/22 135/67  09/29/22 (!) 140/78  08/11/22 (!) 169/74   Wt Readings from Last 3 Encounters:  10/26/22 126 lb 4.8 oz (57.3 kg)  09/29/22 125 lb (56.7 kg)  07/21/22 130 lb (59 kg)      Physical Exam Vitals reviewed.  Constitutional:      General: She is not in acute distress.    Appearance: Normal appearance. She is well-developed. She is not diaphoretic.  HENT:     Head: Normocephalic and atraumatic.  Eyes:     General: No scleral icterus.    Conjunctiva/sclera: Conjunctivae normal.  Neck:     Thyroid: No thyromegaly.  Cardiovascular:     Rate and Rhythm: Normal rate and regular rhythm.     Pulses: Normal pulses.     Heart sounds: Normal heart sounds. No murmur heard. Pulmonary:     Effort: Pulmonary effort is normal. No respiratory distress.     Breath  sounds: Normal breath sounds. No wheezing, rhonchi or rales.  Musculoskeletal:     Cervical back: Neck supple.     Right lower leg: No edema.     Left lower leg: No edema.  Lymphadenopathy:     Cervical: No cervical adenopathy.  Skin:    General: Skin is warm and dry.     Findings: No rash.  Neurological:     Mental Status: She is alert and oriented to person, place, and time. Mental status is at baseline.  Psychiatric:        Mood and Affect: Mood normal.        Behavior: Behavior normal.       No results found for any visits on 10/26/22.  Assessment & Plan     Problem List Items Addressed This Visit       Cardiovascular and Mediastinum   Benign essential HTN - Primary    Well controlled Continue current medications Recheck metabolic panel F/u in 6 months       Relevant Orders   Comprehensive metabolic panel   Senile purpura (HCC)    Continue to monitor         Other   Hypercholesterolemia    Previously well controlled Continue statin Repeat FLP and CMP      Relevant Orders   Lipid panel   Comprehensive  metabolic panel   Other Visit Diagnoses     Anemia, unspecified type       Relevant Orders   CBC   Need for immunization against influenza       Relevant Orders   Flu Vaccine QUAD High Dose(Fluad) (Completed)   Hyperglycemia       Relevant Orders   Hemoglobin A1c        Return in about 6 months (around 04/27/2023) for CPE.      I, [name], MD, have reviewed all documentation for this visit. The documentation on 10/26/22 for the exam, diagnosis, procedures, and orders are all accurate and complete.   Audery Wassenaar, Dionne Bucy, MD, MPH [facility]

## 2022-10-26 NOTE — Assessment & Plan Note (Signed)
Continue to monitor

## 2022-10-26 NOTE — Assessment & Plan Note (Signed)
Well controlled Continue current medications Recheck metabolic panel F/u in 6 months  

## 2022-10-26 NOTE — Assessment & Plan Note (Signed)
Previously well controlled Continue statin Repeat FLP and CMP  

## 2022-10-27 LAB — COMPREHENSIVE METABOLIC PANEL
ALT: 9 IU/L (ref 0–32)
AST: 19 IU/L (ref 0–40)
Albumin/Globulin Ratio: 1.7 (ref 1.2–2.2)
Albumin: 4.2 g/dL (ref 3.8–4.8)
Alkaline Phosphatase: 101 IU/L (ref 44–121)
BUN/Creatinine Ratio: 15 (ref 12–28)
BUN: 11 mg/dL (ref 8–27)
Bilirubin Total: 0.5 mg/dL (ref 0.0–1.2)
CO2: 24 mmol/L (ref 20–29)
Calcium: 9.2 mg/dL (ref 8.7–10.3)
Chloride: 102 mmol/L (ref 96–106)
Creatinine, Ser: 0.72 mg/dL (ref 0.57–1.00)
Globulin, Total: 2.5 g/dL (ref 1.5–4.5)
Glucose: 93 mg/dL (ref 70–99)
Potassium: 4.4 mmol/L (ref 3.5–5.2)
Sodium: 142 mmol/L (ref 134–144)
Total Protein: 6.7 g/dL (ref 6.0–8.5)
eGFR: 88 mL/min/{1.73_m2} (ref >59)

## 2022-10-27 LAB — CBC
Hematocrit: 28.2 % — ABNORMAL LOW (ref 34.0–46.6)
Hemoglobin: 8.3 g/dL — ABNORMAL LOW (ref 11.1–15.9)
MCH: 21.2 pg — ABNORMAL LOW (ref 26.6–33.0)
MCHC: 29.4 g/dL — ABNORMAL LOW (ref 31.5–35.7)
MCV: 72 fL — ABNORMAL LOW (ref 79–97)
Platelets: 209 10*3/uL (ref 150–450)
RBC: 3.91 x10E6/uL (ref 3.77–5.28)
RDW: 15.7 % — ABNORMAL HIGH (ref 11.7–15.4)
WBC: 9.8 10*3/uL (ref 3.4–10.8)

## 2022-10-27 LAB — LIPID PANEL
Chol/HDL Ratio: 3.5 ratio (ref 0.0–4.4)
Cholesterol, Total: 141 mg/dL (ref 100–199)
HDL: 40 mg/dL (ref >39)
LDL Chol Calc (NIH): 75 mg/dL (ref 0–99)
Triglycerides: 152 mg/dL — ABNORMAL HIGH (ref 0–149)
VLDL Cholesterol Cal: 26 mg/dL (ref 5–40)

## 2022-10-27 LAB — HEMOGLOBIN A1C
Est. average glucose Bld gHb Est-mCnc: 120 mg/dL
Hgb A1c MFr Bld: 5.8 % — ABNORMAL HIGH (ref 4.8–5.6)

## 2022-10-29 LAB — FERRITIN: Ferritin: 14 ng/mL — ABNORMAL LOW (ref 15–150)

## 2022-10-29 LAB — IRON AND TIBC
Iron Saturation: 4 % — CL (ref 15–55)
Iron: 15 ug/dL — ABNORMAL LOW (ref 27–139)
Total Iron Binding Capacity: 415 ug/dL (ref 250–450)
UIBC: 400 ug/dL — ABNORMAL HIGH (ref 118–369)

## 2022-10-29 LAB — SPECIMEN STATUS REPORT

## 2022-11-10 ENCOUNTER — Encounter: Payer: Self-pay | Admitting: Internal Medicine

## 2022-11-10 ENCOUNTER — Inpatient Hospital Stay: Payer: Medicare PPO | Attending: Internal Medicine | Admitting: Internal Medicine

## 2022-11-10 ENCOUNTER — Inpatient Hospital Stay: Payer: Medicare PPO

## 2022-11-10 VITALS — BP 146/57 | HR 93 | Temp 97.7°F | Resp 18 | Wt 127.2 lb

## 2022-11-10 DIAGNOSIS — J4489 Other specified chronic obstructive pulmonary disease: Secondary | ICD-10-CM | POA: Insufficient documentation

## 2022-11-10 DIAGNOSIS — Z79899 Other long term (current) drug therapy: Secondary | ICD-10-CM | POA: Insufficient documentation

## 2022-11-10 DIAGNOSIS — G20A1 Parkinson's disease without dyskinesia, without mention of fluctuations: Secondary | ICD-10-CM | POA: Insufficient documentation

## 2022-11-10 DIAGNOSIS — D509 Iron deficiency anemia, unspecified: Secondary | ICD-10-CM | POA: Diagnosis not present

## 2022-11-10 DIAGNOSIS — D649 Anemia, unspecified: Secondary | ICD-10-CM

## 2022-11-10 NOTE — Progress Notes (Signed)
[facility] NOTE  Patient Care Team: Brita Romp Dionne Bucy, MD as PCP - General (Family Medicine) Corey Skains, MD as Consulting Physician (Cardiology) Ree Edman, MD as Consulting Physician (Dermatology) Allyne Gee, MD as Consulting Physician (Internal Medicine) Katy Apo, MD as Consulting Physician (Ophthalmology) Gabriel Carina, Betsey Holiday, MD as Physician Assistant (Endocrinology) Vladimir Crofts, MD as Consulting Physician (Neurology) Rutherford Limerick, MD as Attending Physician (Orthopedic Surgery)  CHIEF COMPLAINTS/PURPOSE OF CONSULTATION: ANEMIA   HEMATOLOGY HISTORY  # ANEMIA[Hb; MCV-platelets- WBC; Iron sat; ferritin;  GFR- CT/US; EGD- none; /colonoscopy-2022- Dr.Anna [GI]   Latest Reference Range & Units 10/26/22 13:39  Iron 27 - 139 ug/dL 15 (L)  UIBC 118 - 369 ug/dL 400 (H)  TIBC 250 - 450 ug/dL 415  Ferritin 15 - 150 ng/mL 14 (L)  Iron Saturation 15 - 55 % 4 (LL)  (LL): Data is critically low (L): Data is abnormally low (H): Data is abnormally high  HISTORY OF PRESENTING ILLNESS: in a wheel chair; with her sister in law.   Crecencio Mc 76 y.o.  female pleasant patient was been referred to [place] for further evaluation of anemia.  Patient noted to have worsening anemia the last 1 year.  However of note patient has been anemic for the last 3 years also.  Denies any significant symptoms.  Blood in stools: none Blood in urine:none Difficulty swallowing: none Change of bowel movement/constipation:on stool softeners.  Prior blood transfusion: yes, dec 2022 s/p arm surgery.  Liver disease: fatty liver.  Alcohol: none Bariatric surgery:none  Vaginal bleeding: none Prior evaluation with hematology: yes [for high WBC count] Prior bone marrow biopsy: none Oral iron: on iron PO x1 week- no  Constipation/dyspepsia Prior IV iron infusions: none   Review of Systems  Constitutional:  Negative for chills, diaphoresis, fever,  malaise/fatigue and weight loss.  HENT:  Negative for nosebleeds and sore throat.   Eyes:  Negative for double vision.  Respiratory:  Negative for cough, hemoptysis, sputum production, shortness of breath and wheezing.   Cardiovascular:  Negative for chest pain, palpitations, orthopnea and leg swelling.  Gastrointestinal:  Negative for abdominal pain, blood in stool, constipation, diarrhea, heartburn, melena, nausea and vomiting.  Genitourinary:  Negative for dysuria, frequency and urgency.  Musculoskeletal:  Negative for back pain and joint pain.  Skin: Negative.  Negative for itching and rash.  Neurological:  Negative for dizziness, tingling, focal weakness, weakness and headaches.  Endo/Heme/Allergies:  Does not bruise/bleed easily.  Psychiatric/Behavioral:  Negative for depression. The patient is not nervous/anxious and does not have insomnia.      MEDICAL HISTORY:  Past Medical History:  Diagnosis Date   Arthritis    Asthma    Crohn's disease ([place])    Emphysema of lung ([place])    GERD (gastroesophageal reflux disease)    Hip fracture ([place]) 01/26/2017   History of chicken pox    History of heart attack    Hx of completed stroke    Hypercholesteremia    Hypertension    Parkinson disease    Pneumonia    Seasonal allergies    Sepsis ([place])    Septic shock ([place]) 10/08/2016   UTI (lower urinary tract infection)     SURGICAL HISTORY: Past Surgical History:  Procedure Laterality Date   BREAST BIOPSY Bilateral 1960's to 90's   breast cysts     CATARACT EXTRACTION, BILATERAL     COLONOSCOPY WITH PROPOFOL N/A 06/01/2022   Procedure: COLONOSCOPY WITH PROPOFOL;  Surgeon: [name], MD;  Location: [facility];  Service: Gastroenterology;  Laterality: N/A;   ELBOW SURGERY     right   right wrist fracture     TUBAL LIGATION     WRIST SURGERY Left     SOCIAL HISTORY: Social History   Socioeconomic History   Marital status: Widowed    Spouse name: Not on file   Number of  children: 3   Years of education: Not on file   Highest education level: Some college, no degree  Occupational History   Occupation: retired  Tobacco Use   Smoking status: Former    Packs/day: 1.00    Years: 30.00    Total pack years: 30.00    Types: Cigarettes    Quit date: 08/01/2008    Years since quitting: 14.2   Smokeless tobacco: Never  Vaping Use   Vaping Use: Never used  Substance and Sexual Activity   Alcohol use: Not Currently    Alcohol/week: 1.0 standard drink of alcohol    Types: 1 Shots of liquor per week   Drug use: No   Sexual activity: Not on file  Other Topics Concern   Not on file  Social History Narrative   Not on file   Social Determinants of Health   Financial Resource Strain: Low Risk  (01/29/2022)   Overall Financial Resource Strain (CARDIA)    Difficulty of Paying Living Expenses: Not hard at all  Food Insecurity: No Food Insecurity (01/29/2022)   Hunger Vital Sign    Worried About Running Out of Food in the Last Year: Never true    [place] in the Last Year: Never true  Transportation Needs: No Transportation Needs (01/29/2022)   PRAPARE - Hydrologist (Medical): No    Lack of Transportation (Non-Medical): No  Physical Activity: Insufficiently Active (01/29/2022)   Exercise Vital Sign    Days of Exercise per Week: 3 days    Minutes of Exercise per Session: 20 min  Stress: No Stress Concern Present (01/29/2022)   [place]    Feeling of Stress : Not at all  Social Connections: Socially Isolated (01/29/2022)   Social Connection and Isolation Panel [NHANES]    Frequency of Communication with Friends and Family: More than three times a week    Frequency of Social Gatherings with Friends and Family: Twice a week    Attends Religious Services: Never    Marine scientist or Organizations: No    Attends Archivist Meetings: Never     Marital Status: Widowed  Intimate Partner Violence: Not At Risk (01/29/2022)   Humiliation, Afraid, Rape, and Kick questionnaire    Fear of Current or Ex-Partner: No    Emotionally Abused: No    Physically Abused: No    Sexually Abused: No    FAMILY HISTORY: Family History  Problem Relation Age of Onset   Cancer Sister        ovarian/uterine/breast   Breast cancer Sister 31   Cancer Sister        breast   Heart disease Sister    Breast cancer Sister 72   Heart disease Father    Cancer Father        liver   Pancreatic cancer Mother    Heart disease Brother    Heart attack Brother    Cancer Brother        liver  Heart disease Sister    Diabetes Maternal Grandmother     ALLERGIES:  is allergic to levofloxacin and vicodin [hydrocodone-acetaminophen].  MEDICATIONS:  Current Outpatient Medications  Medication Sig Dispense Refill   albuterol (ACCUNEB) 0.63 MG/3ML nebulizer solution Take 3 mLs (0.63 mg total) by nebulization every 6 (six) hours as needed for wheezing. 360 mL 3   albuterol (VENTOLIN HFA) 108 (90 Base) MCG/ACT inhaler INHALE 2 PUFFS INTO THE LUNGS EVERY 6 HOURS AS NEEDED FOR WHEEZING OR SHORTNESS OF BREATH 8.5 g 3   alendronate (FOSAMAX) 70 MG tablet Take 1 tablet (70 mg total) by mouth once a week. Take with a full glass of water on an empty stomach. 12 tablet 3   aspirin 81 MG EC tablet Take by mouth.     atorvastatin (LIPITOR) 40 MG tablet Take 1 tablet (40 mg total) by mouth at bedtime. 90 tablet 0   azelastine (ASTELIN) 0.1 % nasal spray Place 2 sprays into both nostrils daily at 6 (six) AM.     betamethasone valerate (VALISONE) 0.1 % cream APPLY TOPICALLY TO AFFECTED AREA SPARINGLY 2 TIMES DAILY. 15 g 5   BREO ELLIPTA 100-25 MCG/ACT AEPB INHALE 1 PUFF BY MOUTH ONCE DAILY AS DIRECTED 180 each 1   carbidopa-levodopa (SINEMET CR) 50-200 MG tablet Take 1 tablet by mouth daily. Before dose     carbidopa-levodopa (SINEMET CR) 50-200 MG tablet Take 1 tablet by mouth  at bedtime.     carbidopa-levodopa (SINEMET IR) 25-100 MG tablet 3 pill in the morning, then 2 pills two times a day     cetirizine (ZYRTEC) 10 MG tablet TAKE 1 TABLET BY MOUTH ONCE DAILY 30 tablet 1   Cholecalciferol (VITAMIN D) 2000 UNITS CAPS Take 1 capsule by mouth daily.     citalopram (CELEXA) 20 MG tablet TAKE 1 TABLET BY MOUTH ONCE DAILY 90 tablet 0   clobetasol (TEMOVATE) 0.05 % external solution Apply 1 application topically 2 (two) times daily. As needed     clobetasol cream (TEMOVATE) 1.94 % Apply 1 application topically 2 (two) times daily. As needed     doxepin (SINEQUAN) 25 MG capsule TAKE 1 CAPSULE BY MOUTH AT BEDTIME 30 capsule 5   fluticasone (FLONASE) 50 MCG/ACT nasal spray      ibuprofen (ADVIL) 200 MG tablet Take 200 mg by mouth every 6 (six) hours as needed.     ketoconazole (NIZORAL) 2 % shampoo Apply 1 application. topically 2 (two) times a week.     melatonin 5 MG TABS Take 1 tablet by mouth at bedtime.     meloxicam (MOBIC) 7.5 MG tablet TAKE 1 TABLET BY MOUTH ONCE DAILY 30 tablet 2   Meloxicam 10 MG CAPS      montelukast (SINGULAIR) 10 MG tablet TAKE 1 TABLET BY MOUTH ONCE EVERY EVENING 90 tablet 1   omeprazole (PRILOSEC) 40 MG capsule TAKE 1 CAPSULE BY MOUTH ONCE DAILY 90 capsule 0   simvastatin (ZOCOR) 40 MG tablet TAKE 1 TABLET BY MOUTH ONCE EVERY EVENING     SPIRIVA RESPIMAT 2.5 MCG/ACT AERS INHALE 2 PUFFS BY MOUTH ONCE DAILY FOR COPD 12 g 3   traMADol (ULTRAM) 50 MG tablet      No current facility-administered medications for this visit.     Marland Kitchen  PHYSICAL EXAMINATION:   Vitals:   11/10/22 1410  BP: (!) 146/57  Pulse: 93  Resp: 18  Temp: 97.7 F (36.5 C)  SpO2: 98%   Filed Weights   11/10/22 1410  Weight: 127 lb 3.2 oz (57.7 kg)    Physical Exam Vitals and nursing note reviewed.  HENT:     Head: Normocephalic and atraumatic.     Mouth/Throat:     Pharynx: Oropharynx is clear.  Eyes:     Extraocular Movements: Extraocular movements intact.      Pupils: Pupils are equal, round, and reactive to light.  Cardiovascular:     Rate and Rhythm: Normal rate and regular rhythm.  Pulmonary:     Comments: Decreased breath sounds bilaterally.  Abdominal:     Palpations: Abdomen is soft.  Musculoskeletal:        General: Normal range of motion.     Cervical back: Normal range of motion.  Skin:    General: Skin is warm.  Neurological:     General: No focal deficit present.     Mental Status: She is alert and oriented to person, place, and time.  Psychiatric:        Behavior: Behavior normal.        Judgment: Judgment normal.      LABORATORY DATA:  I have reviewed the data as listed Lab Results  Component Value Date   WBC 9.8 10/26/2022   HGB 8.3 (L) 10/26/2022   HCT 28.2 (L) 10/26/2022   MCV 72 (L) 10/26/2022   PLT 209 10/26/2022   Recent Labs    12/15/21 1434 10/26/22 1339  NA 139 142  K 4.9 4.4  CL 101 102  CO2 24 24  GLUCOSE 105* 93  BUN 14 11  CREATININE 0.64 0.72  CALCIUM 9.7 9.2  PROT 7.0 6.7  ALBUMIN 4.5 4.2  AST 28 19  ALT 6 9  ALKPHOS 153* 101  BILITOT 0.5 0.5     No results found.  ASSESSMENT & PLAN:   Symptomatic anemia #Iron deficient anemia:[iron sat- 4%; LOW ferritin; Hb 8.3- DEC 2023-PCP.  Currently on  PO iron once a day x2 weeks.  No constipation.  # Given the lack of significant symptoms continue oral iron at this time.  However recommend twice a day.  Also discussed the potential IV iron infusions if malabsorption is suspected.  Discussed the potential acute infusion reactions with IV iron; which are quite rare.  Patient understands the risk; will proceed with infusions if necessary.  I had a long discussion with the patient regarding various forms of dietary iron as it is present universally in meat.  Also discussed regarding plant sources-including but not limited to leafy vegetables [spinach], broccoli.   #Etiology of iron deficiency: Unclear; prior GI evaluation-/ Dr.Anna-  colonoscopy [[id number]- WNL]; recommend EGD/capsule study; If no obvious cause noted would recommend further work-up including a bone marrow biopsy imaging for now hold off bone marrow biopsy/imaging at this time.  Recommend UA today.  Will refer to Dr. Vicente Males for further evaluation.  # History of Parkinson disease/COPD-stable.  Thank you Dr. Brita Romp for allowing me to participate in the care of your pleasant patient. Please do not hesitate to contact me with questions or concerns in the interim.  # DISPOSITION: # NO labs today # order urine analysis; no culture # refer to Dr.Anna re: iron deficiency anemia # follow up in 6 weeks- MD; labs- cbc/bmp; retic countLDH; possible venofer Dr.B    All questions were answered. The patient knows to call the clinic with any problems, questions or concerns.    Lori Sickle, MD 11/10/2022 3:45 PM

## 2022-11-10 NOTE — Assessment & Plan Note (Addendum)
#  Iron deficient anemia:[iron sat- 4%; LOW ferritin; Hb 8.3- DEC 2023-PCP.  Currently on  PO iron once a day x2 weeks.  No constipation.  # Given the lack of significant symptoms continue oral iron at this time.  However recommend twice a day.  Also discussed the potential IV iron infusions if malabsorption is suspected.  Discussed the potential acute infusion reactions with IV iron; which are quite rare.  Patient understands the risk; will proceed with infusions if necessary.  I had a long discussion with the patient regarding various forms of dietary iron as it is present universally in meat.  Also discussed regarding plant sources-including but not limited to leafy vegetables [spinach], broccoli.   #Etiology of iron deficiency: Unclear; prior GI evaluation-/ Dr.Anna- colonoscopy [[id number]- WNL]; recommend EGD/capsule study; If no obvious cause noted would recommend further work-up including a bone marrow biopsy imaging for now hold off bone marrow biopsy/imaging at this time.  Recommend UA today.  Will refer to Dr. Vicente Males for further evaluation.  # History of Parkinson disease/COPD-stable.  Thank you Dr. Brita Romp for allowing me to participate in the care of your pleasant patient. Please do not hesitate to contact me with questions or concerns in the interim.  # DISPOSITION: # NO labs today # order urine analysis; no culture # refer to Dr.Anna re: iron deficiency anemia # follow up in 6 weeks- MD; labs- cbc/bmp; retic countLDH; possible venofer Dr.B

## 2022-11-10 NOTE — Progress Notes (Signed)
Patient here today for initial evaluation regarding anemia. Patient reports she has been asymptomatic, she is taking oral iron. Patient was seen by Dr. Grayland Ormond in the past for elevated WBC.

## 2022-11-11 DIAGNOSIS — D509 Iron deficiency anemia, unspecified: Secondary | ICD-10-CM | POA: Diagnosis not present

## 2022-11-11 DIAGNOSIS — G20A1 Parkinson's disease without dyskinesia, without mention of fluctuations: Secondary | ICD-10-CM | POA: Diagnosis not present

## 2022-11-11 DIAGNOSIS — Z79899 Other long term (current) drug therapy: Secondary | ICD-10-CM | POA: Diagnosis not present

## 2022-11-11 DIAGNOSIS — J4489 Other specified chronic obstructive pulmonary disease: Secondary | ICD-10-CM | POA: Diagnosis not present

## 2022-11-11 LAB — URINALYSIS, COMPLETE (UACMP) WITH MICROSCOPIC
Bilirubin Urine: NEGATIVE
Glucose, UA: NEGATIVE mg/dL
Hgb urine dipstick: NEGATIVE
Ketones, ur: NEGATIVE mg/dL
Nitrite: POSITIVE — AB
Protein, ur: NEGATIVE mg/dL
Specific Gravity, Urine: 1.013 (ref 1.005–1.030)
WBC, UA: >50 WBC/hpf — ABNORMAL HIGH (ref 0–5)
pH: 6 (ref 5.0–8.0)

## 2022-12-07 ENCOUNTER — Other Ambulatory Visit: Payer: Self-pay

## 2022-12-07 DIAGNOSIS — R739 Hyperglycemia, unspecified: Secondary | ICD-10-CM

## 2022-12-07 DIAGNOSIS — E7800 Pure hypercholesterolemia, unspecified: Secondary | ICD-10-CM

## 2022-12-07 DIAGNOSIS — I10 Essential (primary) hypertension: Secondary | ICD-10-CM

## 2022-12-18 MED FILL — Iron Sucrose Inj 20 MG/ML (Fe Equiv): INTRAVENOUS | Qty: 10 | Status: AC

## 2022-12-21 ENCOUNTER — Inpatient Hospital Stay: Payer: Medicare PPO

## 2022-12-21 ENCOUNTER — Inpatient Hospital Stay: Payer: Medicare PPO | Attending: Internal Medicine

## 2022-12-21 ENCOUNTER — Inpatient Hospital Stay (HOSPITAL_BASED_OUTPATIENT_CLINIC_OR_DEPARTMENT_OTHER): Payer: Medicare PPO | Admitting: Internal Medicine

## 2022-12-21 ENCOUNTER — Encounter: Payer: Self-pay | Admitting: Internal Medicine

## 2022-12-21 VITALS — BP 104/75 | HR 81 | Temp 98.0°F | Resp 16 | Wt 126.0 lb

## 2022-12-21 DIAGNOSIS — J4489 Other specified chronic obstructive pulmonary disease: Secondary | ICD-10-CM | POA: Diagnosis not present

## 2022-12-21 DIAGNOSIS — D649 Anemia, unspecified: Secondary | ICD-10-CM

## 2022-12-21 DIAGNOSIS — D509 Iron deficiency anemia, unspecified: Secondary | ICD-10-CM | POA: Diagnosis not present

## 2022-12-21 DIAGNOSIS — G20A1 Parkinson's disease without dyskinesia, without mention of fluctuations: Secondary | ICD-10-CM | POA: Insufficient documentation

## 2022-12-21 DIAGNOSIS — Z79899 Other long term (current) drug therapy: Secondary | ICD-10-CM | POA: Insufficient documentation

## 2022-12-21 LAB — RETICULOCYTES
Immature Retic Fract: 10.5 % (ref 2.3–15.9)
RBC.: 4.28 MIL/uL (ref 3.87–5.11)
Retic Count, Absolute: 94.2 10*3/uL (ref 19.0–186.0)
Retic Ct Pct: 2.2 % (ref 0.4–3.1)

## 2022-12-21 LAB — CBC WITH DIFFERENTIAL/PLATELET
Abs Immature Granulocytes: 0.14 10*3/uL — ABNORMAL HIGH (ref 0.00–0.07)
Basophils Absolute: 0 10*3/uL (ref 0.0–0.1)
Basophils Relative: 0 %
Eosinophils Absolute: 0.3 10*3/uL (ref 0.0–0.5)
Eosinophils Relative: 4 %
HCT: 36.2 % (ref 36.0–46.0)
Hemoglobin: 11.3 g/dL — ABNORMAL LOW (ref 12.0–15.0)
Immature Granulocytes: 2 %
Lymphocytes Relative: 20 %
Lymphs Abs: 1.9 10*3/uL (ref 0.7–4.0)
MCH: 26.5 pg (ref 26.0–34.0)
MCHC: 31.2 g/dL (ref 30.0–36.0)
MCV: 84.8 fL (ref 80.0–100.0)
Monocytes Absolute: 0.7 10*3/uL (ref 0.1–1.0)
Monocytes Relative: 7 %
Neutro Abs: 6.2 10*3/uL (ref 1.7–7.7)
Neutrophils Relative %: 67 %
Platelets: 176 10*3/uL (ref 150–400)
RBC: 4.27 MIL/uL (ref 3.87–5.11)
RDW: 22.3 % — ABNORMAL HIGH (ref 11.5–15.5)
WBC: 9.3 10*3/uL (ref 4.0–10.5)
nRBC: 0 % (ref 0.0–0.2)

## 2022-12-21 LAB — BASIC METABOLIC PANEL
Anion gap: 10 (ref 5–15)
BUN: 16 mg/dL (ref 8–23)
CO2: 26 mmol/L (ref 22–32)
Calcium: 9.1 mg/dL (ref 8.9–10.3)
Chloride: 102 mmol/L (ref 98–111)
Creatinine, Ser: 0.74 mg/dL (ref 0.44–1.00)
GFR, Estimated: >60 mL/min (ref >60)
Glucose, Bld: 94 mg/dL (ref 70–99)
Potassium: 3.9 mmol/L (ref 3.5–5.1)
Sodium: 138 mmol/L (ref 135–145)

## 2022-12-21 LAB — LACTATE DEHYDROGENASE: LDH: 126 U/L (ref 98–192)

## 2022-12-21 NOTE — Progress Notes (Signed)
[place] NOTE  Patient Care Team: Brita Romp Dionne Bucy, MD as PCP - General (Family Medicine) Corey Skains, MD as Consulting Physician (Cardiology) Ree Edman, MD as Consulting Physician (Dermatology) Allyne Gee, MD as Consulting Physician (Internal Medicine) Katy Apo, MD as Consulting Physician (Ophthalmology) Gabriel Carina, Betsey Holiday, MD as Physician Assistant (Endocrinology) Vladimir Crofts, MD as Consulting Physician (Neurology) Rutherford Limerick, MD as Attending Physician (Orthopedic Surgery)  CHIEF COMPLAINTS/PURPOSE OF CONSULTATION: ANEMIA   HEMATOLOGY HISTORY  # ANEMIA[Hb; MCV-platelets- WBC; Iron sat; ferritin;  GFR- CT/US; EGD- none; /colonoscopy-2022- Dr.Anna [GI]   Latest Reference Range & Units 10/26/22 13:39  Iron 27 - 139 ug/dL 15 (L)  UIBC 118 - 369 ug/dL 400 (H)  TIBC 250 - 450 ug/dL 415  Ferritin 15 - 150 ng/mL 14 (L)  Iron Saturation 15 - 55 % 4 (LL)  (LL): Data is critically low (L): Data is abnormally low (H): Data is abnormally high  HISTORY OF PRESENTING ILLNESS: in a wheel chair; with her sister in law.   Lori Brady 77 y.o.  female pleasant patient iron deficient anemia is here for follow-up.   Patient here for follow up and denies any concerns.  Patient currently on iron pills twice a day.  Denies any constipation.  Notes slight improvement of energy levels.  Denies any blood in stools or black-colored stools.  Review of Systems  Constitutional:  Negative for chills, diaphoresis, fever, malaise/fatigue and weight loss.  HENT:  Negative for nosebleeds and sore throat.   Eyes:  Negative for double vision.  Respiratory:  Negative for cough, hemoptysis, sputum production, shortness of breath and wheezing.   Cardiovascular:  Negative for chest pain, palpitations, orthopnea and leg swelling.  Gastrointestinal:  Negative for abdominal pain, blood in stool, constipation, diarrhea, heartburn, melena, nausea and  vomiting.  Genitourinary:  Negative for dysuria, frequency and urgency.  Musculoskeletal:  Negative for back pain and joint pain.  Skin: Negative.  Negative for itching and rash.  Neurological:  Negative for dizziness, tingling, focal weakness, weakness and headaches.  Endo/Heme/Allergies:  Does not bruise/bleed easily.  Psychiatric/Behavioral:  Negative for depression. The patient is not nervous/anxious and does not have insomnia.      MEDICAL HISTORY:  Past Medical History:  Diagnosis Date   Arthritis    Asthma    Crohn's disease ([place])    Emphysema of lung ([place])    GERD (gastroesophageal reflux disease)    Hip fracture ([place]) 01/26/2017   History of chicken pox    History of heart attack    Hx of completed stroke    Hypercholesteremia    Hypertension    Parkinson disease    Pneumonia    Seasonal allergies    Sepsis ([place])    Septic shock ([place]) 10/08/2016   UTI (lower urinary tract infection)     SURGICAL HISTORY: Past Surgical History:  Procedure Laterality Date   BREAST BIOPSY Bilateral 1960's to 90's   breast cysts     CATARACT EXTRACTION, BILATERAL     COLONOSCOPY WITH PROPOFOL N/A 06/01/2022   Procedure: COLONOSCOPY WITH PROPOFOL;  Surgeon: [name], MD;  Location: [facility] ENDOSCOPY;  Service: Gastroenterology;  Laterality: N/A;   ELBOW SURGERY     right   right wrist fracture     TUBAL LIGATION     WRIST SURGERY Left     SOCIAL HISTORY: Social History   Socioeconomic History   Marital status: Widowed    Spouse name:  Not on file   Number of children: 3   Years of education: Not on file   Highest education level: Some college, no degree  Occupational History   Occupation: retired  Tobacco Use   Smoking status: Former    Packs/day: 1.00    Years: 30.00    Total pack years: 30.00    Types: Cigarettes    Quit date: 08/01/2008    Years since quitting: 14.3   Smokeless tobacco: Never  Vaping Use   Vaping Use: Never used  Substance and Sexual Activity    Alcohol use: Not Currently    Alcohol/week: 1.0 standard drink of alcohol    Types: 1 Shots of liquor per week   Drug use: No   Sexual activity: Not on file  Other Topics Concern   Not on file  Social History Narrative   Not on file   Social Determinants of Health   Financial Resource Strain: Low Risk  (01/29/2022)   Overall Financial Resource Strain (CARDIA)    Difficulty of Paying Living Expenses: Not hard at all  Food Insecurity: No Food Insecurity (01/29/2022)   Hunger Vital Sign    Worried About Running Out of Food in the Last Year: Never true    Ran Out of Food in the Last Year: Never true  Transportation Needs: No Transportation Needs (01/29/2022)   PRAPARE - Administrator, Civil Service (Medical): No    Lack of Transportation (Non-Medical): No  Physical Activity: Insufficiently Active (01/29/2022)   Exercise Vital Sign    Days of Exercise per Week: 3 days    Minutes of Exercise per Session: 20 min  Stress: No Stress Concern Present (01/29/2022)   [employer] of Occupational Health - Occupational Stress Questionnaire    Feeling of Stress : Not at all  Social Connections: Socially Isolated (01/29/2022)   Social Connection and Isolation Panel [NHANES]    Frequency of Communication with Friends and Family: More than three times a week    Frequency of Social Gatherings with Friends and Family: Twice a week    Attends Religious Services: Never    Database administrator or Organizations: No    Attends Banker Meetings: Never    Marital Status: Widowed  Intimate Partner Violence: Not At Risk (01/29/2022)   Humiliation, Afraid, Rape, and Kick questionnaire    Fear of Current or Ex-Partner: No    Emotionally Abused: No    Physically Abused: No    Sexually Abused: No    FAMILY HISTORY: Family History  Problem Relation Age of Onset   Cancer Sister        ovarian/uterine/breast   Breast cancer Sister 39   Cancer Sister        breast   Heart  disease Sister    Breast cancer Sister 60   Heart disease Father    Cancer Father        liver   Pancreatic cancer Mother    Heart disease Brother    Heart attack Brother    Cancer Brother        liver   Heart disease Sister    Diabetes Maternal Grandmother     ALLERGIES:  is allergic to levofloxacin and vicodin [hydrocodone-acetaminophen].  MEDICATIONS:  Current Outpatient Medications  Medication Sig Dispense Refill   albuterol (ACCUNEB) 0.63 MG/3ML nebulizer solution Take 3 mLs (0.63 mg total) by nebulization every 6 (six) hours as needed for wheezing. 360 mL 3   albuterol (  VENTOLIN HFA) 108 (90 Base) MCG/ACT inhaler INHALE 2 PUFFS INTO THE LUNGS EVERY 6 HOURS AS NEEDED FOR WHEEZING OR SHORTNESS OF BREATH 8.5 g 3   alendronate (FOSAMAX) 70 MG tablet Take 1 tablet (70 mg total) by mouth once a week. Take with a full glass of water on an empty stomach. 12 tablet 3   aspirin 81 MG EC tablet Take by mouth.     atorvastatin (LIPITOR) 40 MG tablet Take 1 tablet (40 mg total) by mouth at bedtime. 90 tablet 0   azelastine (ASTELIN) 0.1 % nasal spray Place 2 sprays into both nostrils daily at 6 (six) AM.     betamethasone valerate (VALISONE) 0.1 % cream APPLY TOPICALLY TO AFFECTED AREA SPARINGLY 2 TIMES DAILY. 15 g 5   BREO ELLIPTA 100-25 MCG/ACT AEPB INHALE 1 PUFF BY MOUTH ONCE DAILY AS DIRECTED 180 each 1   carbidopa-levodopa (SINEMET CR) 50-200 MG tablet Take 1 tablet by mouth daily. Before dose     carbidopa-levodopa (SINEMET CR) 50-200 MG tablet Take 1 tablet by mouth at bedtime.     carbidopa-levodopa (SINEMET IR) 25-100 MG tablet 3 pill in the morning, then 2 pills two times a day     cetirizine (ZYRTEC) 10 MG tablet TAKE 1 TABLET BY MOUTH ONCE DAILY 30 tablet 1   Cholecalciferol (VITAMIN D) 2000 UNITS CAPS Take 1 capsule by mouth daily.     citalopram (CELEXA) 20 MG tablet TAKE 1 TABLET BY MOUTH ONCE DAILY 90 tablet 0   clobetasol (TEMOVATE) 0.05 % external solution Apply 1  application topically 2 (two) times daily. As needed     clobetasol cream (TEMOVATE) 6.44 % Apply 1 application topically 2 (two) times daily. As needed     doxepin (SINEQUAN) 25 MG capsule TAKE 1 CAPSULE BY MOUTH AT BEDTIME 30 capsule 5   fluticasone (FLONASE) 50 MCG/ACT nasal spray      ibuprofen (ADVIL) 200 MG tablet Take 200 mg by mouth every 6 (six) hours as needed.     ketoconazole (NIZORAL) 2 % shampoo Apply 1 application. topically 2 (two) times a week.     melatonin 5 MG TABS Take 1 tablet by mouth at bedtime.     meloxicam (MOBIC) 7.5 MG tablet TAKE 1 TABLET BY MOUTH ONCE DAILY 30 tablet 2   Meloxicam 10 MG CAPS      montelukast (SINGULAIR) 10 MG tablet TAKE 1 TABLET BY MOUTH ONCE EVERY EVENING 90 tablet 1   omeprazole (PRILOSEC) 40 MG capsule TAKE 1 CAPSULE BY MOUTH ONCE DAILY 90 capsule 0   simvastatin (ZOCOR) 40 MG tablet TAKE 1 TABLET BY MOUTH ONCE EVERY EVENING     SPIRIVA RESPIMAT 2.5 MCG/ACT AERS INHALE 2 PUFFS BY MOUTH ONCE DAILY FOR COPD 12 g 3   traMADol (ULTRAM) 50 MG tablet      No current facility-administered medications for this visit.     Marland Kitchen  PHYSICAL EXAMINATION:   Vitals:   12/21/22 1305  BP: 104/75  Pulse: 81  Resp: 16  Temp: 98 F (36.7 C)  SpO2: 98%   Filed Weights   12/21/22 1305  Weight: 126 lb (57.2 kg)    Physical Exam Vitals and nursing note reviewed.  HENT:     Head: Normocephalic and atraumatic.     Mouth/Throat:     Pharynx: Oropharynx is clear.  Eyes:     Extraocular Movements: Extraocular movements intact.     Pupils: Pupils are equal, round, and reactive to light.  Cardiovascular:     Rate and Rhythm: Normal rate and regular rhythm.  Pulmonary:     Comments: Decreased breath sounds bilaterally.  Abdominal:     Palpations: Abdomen is soft.  Musculoskeletal:        General: Normal range of motion.     Cervical back: Normal range of motion.  Skin:    General: Skin is warm.  Neurological:     General: No focal deficit  present.     Mental Status: She is alert and oriented to person, place, and time.  Psychiatric:        Behavior: Behavior normal.        Judgment: Judgment normal.      LABORATORY DATA:  I have reviewed the data as listed Lab Results  Component Value Date   WBC 9.3 12/21/2022   HGB 11.3 (L) 12/21/2022   HCT 36.2 12/21/2022   MCV 84.8 12/21/2022   PLT 176 12/21/2022   Recent Labs    10/26/22 1339 12/21/22 1241  NA 142 138  K 4.4 3.9  CL 102 102  CO2 24 26  GLUCOSE 93 94  BUN 11 16  CREATININE 0.72 0.74  CALCIUM 9.2 9.1  GFRNONAA  --  >60  PROT 6.7  --   ALBUMIN 4.2  --   AST 19  --   ALT 9  --   ALKPHOS 101  --   BILITOT 0.5  --      No results found.  ASSESSMENT & PLAN:   Symptomatic anemia #Iron deficient anemia:[iron sat- 4%; LOW ferritin; Hb 8.3- DEC 2023-PCP.  Currently on  PO iron BID  x2  months. No constipation.  # FEB 2024- Hb 11.3- improved- continue PO iron BID. HD venofer.   #Etiology of iron deficiency: Unclear; prior GI evaluation-/ Dr.Anna- colonoscopy [[id number]]; awaiting evaluation with Dr.Anna on April EGD/capsule study. UA neg for blood in dec 2023.   # History of Parkinson disease/COPD-stable.  # DISPOSITION: # NO venofer today # follow up in 3 months- MD; labs- cbc/bmp; iron studies/ferritin- possible venofer Dr.B    All questions were answered. The patient knows to call the clinic with any problems, questions or concerns.    Cammie Sickle, MD 12/21/2022 9:26 PM

## 2022-12-21 NOTE — Assessment & Plan Note (Signed)
#  Iron deficient anemia:[iron sat- 4%; LOW ferritin; Hb 8.3- DEC 2023-PCP.  Currently on  PO iron BID  x2  months. No constipation.  # FEB 2024- Hb 11.3- improved- continue PO iron BID. HD venofer.   #Etiology of iron deficiency: Unclear; prior GI evaluation-/ Dr.Anna- colonoscopy [[id number]]; awaiting evaluation with Dr.Anna on April EGD/capsule study. UA neg for blood in dec 2023.   # History of Parkinson disease/COPD-stable.  # DISPOSITION: # NO venofer today # follow up in 3 months- MD; labs- cbc/bmp; iron studies/ferritin- possible venofer Dr.B

## 2022-12-21 NOTE — Progress Notes (Signed)
Patient here for follow up and denies any concerns.

## 2022-12-21 NOTE — Progress Notes (Signed)
Per MD no Venofer today. Patient discharged. No concerns voiced.

## 2022-12-22 ENCOUNTER — Encounter: Payer: Self-pay | Admitting: Family Medicine

## 2022-12-22 ENCOUNTER — Ambulatory Visit: Payer: Medicare PPO | Admitting: Family Medicine

## 2022-12-22 VITALS — BP 133/81 | HR 80 | Temp 97.8°F | Resp 20 | Wt 126.0 lb

## 2022-12-22 DIAGNOSIS — D649 Anemia, unspecified: Secondary | ICD-10-CM | POA: Diagnosis not present

## 2022-12-22 DIAGNOSIS — I10 Essential (primary) hypertension: Secondary | ICD-10-CM

## 2022-12-22 NOTE — Progress Notes (Signed)
I,Sulibeya S Dimas,acting as a scribe for [name], MD.,have documented all relevant documentation on the behalf of [name], MD,as directed by  [name], MD while in the presence of [name], MD.     Established patient visit   Patient: Lori Brady   DOB: 08/30/1946   77 y.o. Female  MRN: [id number] Visit Date: 12/22/2022  Today's healthcare provider: [name], MD   Chief Complaint  Patient presents with   Follow-up   Subjective    HPI  Follow up for lab  The patient was last seen for this 2 months ago. Changes made at last visit include labs. Start ferrous sulfate 325mg  BID.   She reports excellent compliance with treatment. She feels that condition is Unchanged. She is not having side effects.   ----------------------------------------------------------------------------------------   Medications: Outpatient Medications Prior to Visit  Medication Sig   albuterol (ACCUNEB) 0.63 MG/3ML nebulizer solution Take 3 mLs (0.63 mg total) by nebulization every 6 (six) hours as needed for wheezing.   albuterol (VENTOLIN HFA) 108 (90 Base) MCG/ACT inhaler INHALE 2 PUFFS INTO THE LUNGS EVERY 6 HOURS AS NEEDED FOR WHEEZING OR SHORTNESS OF BREATH   alendronate (FOSAMAX) 70 MG tablet Take 1 tablet (70 mg total) by mouth once a week. Take with a full glass of water on an empty stomach.   aspirin 81 MG EC tablet Take by mouth.   atorvastatin (LIPITOR) 40 MG tablet Take 1 tablet (40 mg total) by mouth at bedtime.   azelastine (ASTELIN) 0.1 % nasal spray Place 2 sprays into both nostrils daily at 6 (six) AM.   betamethasone valerate (VALISONE) 0.1 % cream APPLY TOPICALLY TO AFFECTED AREA SPARINGLY 2 TIMES DAILY.   BREO ELLIPTA 100-25 MCG/ACT AEPB INHALE 1 PUFF BY MOUTH ONCE DAILY AS DIRECTED   carbidopa-levodopa (SINEMET CR) 50-200 MG tablet Take 1 tablet by mouth daily. Before dose   carbidopa-levodopa (SINEMET CR) 50-200 MG tablet Take 1  tablet by mouth at bedtime.   carbidopa-levodopa (SINEMET IR) 25-100 MG tablet 3 pill in the morning, then 2 pills two times a day   cetirizine (ZYRTEC) 10 MG tablet TAKE 1 TABLET BY MOUTH ONCE DAILY   Cholecalciferol (VITAMIN D) 2000 UNITS CAPS Take 1 capsule by mouth daily.   citalopram (CELEXA) 20 MG tablet TAKE 1 TABLET BY MOUTH ONCE DAILY   clobetasol (TEMOVATE) 0.05 % external solution Apply 1 application topically 2 (two) times daily. As needed   clobetasol cream (TEMOVATE) 3.71 % Apply 1 application topically 2 (two) times daily. As needed   doxepin (SINEQUAN) 25 MG capsule TAKE 1 CAPSULE BY MOUTH AT BEDTIME   ferrous sulfate 325 (65 FE) MG EC tablet Take 325 mg by mouth 2 (two) times daily.   fluticasone (FLONASE) 50 MCG/ACT nasal spray    ibuprofen (ADVIL) 200 MG tablet Take 200 mg by mouth every 6 (six) hours as needed.   ketoconazole (NIZORAL) 2 % shampoo Apply 1 application. topically 2 (two) times a week.   melatonin 5 MG TABS Take 1 tablet by mouth at bedtime.   meloxicam (MOBIC) 7.5 MG tablet TAKE 1 TABLET BY MOUTH ONCE DAILY   Meloxicam 10 MG CAPS    montelukast (SINGULAIR) 10 MG tablet TAKE 1 TABLET BY MOUTH ONCE EVERY EVENING   omeprazole (PRILOSEC) 40 MG capsule TAKE 1 CAPSULE BY MOUTH ONCE DAILY   simvastatin (ZOCOR) 40 MG tablet TAKE 1 TABLET BY MOUTH ONCE EVERY EVENING   SPIRIVA RESPIMAT 2.5 MCG/ACT AERS  INHALE 2 PUFFS BY MOUTH ONCE DAILY FOR COPD   traMADol (ULTRAM) 50 MG tablet    No facility-administered medications prior to visit.    Review of Systems  Constitutional:  Negative for appetite change, chills, fatigue and fever.  Respiratory:  Positive for cough. Negative for chest tightness and shortness of breath.   Cardiovascular:  Negative for chest pain and leg swelling.  Gastrointestinal:  Negative for abdominal pain, blood in stool, constipation, diarrhea, nausea and vomiting.  Genitourinary:  Negative for hematuria.       Objective    BP 133/81 (BP  Location: Right Arm, Patient Position: Sitting, Cuff Size: Normal)   Pulse 80   Temp 97.8 F (36.6 C) (Temporal)   Resp 20   Wt 126 lb (57.2 kg)   SpO2 98%   BMI 23.81 kg/m    Physical Exam Vitals reviewed.  Constitutional:      General: She is not in acute distress.    Appearance: Normal appearance. She is well-developed. She is not diaphoretic.  HENT:     Head: Normocephalic and atraumatic.  Eyes:     General: No scleral icterus.    Conjunctiva/sclera: Conjunctivae normal.  Neck:     Thyroid: No thyromegaly.  Cardiovascular:     Rate and Rhythm: Normal rate and regular rhythm.     Heart sounds: Normal heart sounds.  Pulmonary:     Effort: Pulmonary effort is normal. No respiratory distress.     Breath sounds: Normal breath sounds. No wheezing, rhonchi or rales.  Musculoskeletal:     Right lower leg: No edema.     Left lower leg: No edema.  Neurological:     Mental Status: She is alert and oriented to person, place, and time. Mental status is at baseline.  Psychiatric:        Mood and Affect: Mood normal.       No results found for any visits on 12/22/22.  Assessment & Plan     Problem List Items Addressed This Visit       Cardiovascular and Mediastinum   Benign essential HTN - Primary    Well controlled Continue current medications Reviewed metabolic panel        Other   Symptomatic anemia    Improving significantly on PO iron Now seeing Hematology  Unclear etiology of iron deficiency      Relevant Medications   ferrous sulfate 325 (65 FE) MG EC tablet     Return in about 4 months (around 04/22/2023) for CPE, as scheduled.      I, [name], MD, have reviewed all documentation for this visit. The documentation on 12/22/22 for the exam, diagnosis, procedures, and orders are all accurate and complete.   Ronie Barnhart, Dionne Bucy, MD, MPH [facility]

## 2022-12-22 NOTE — Assessment & Plan Note (Signed)
Well controlled Continue current medications Reviewed metabolic panel 

## 2022-12-22 NOTE — Assessment & Plan Note (Signed)
Improving significantly on PO iron Now seeing Hematology  Unclear etiology of iron deficiency

## 2022-12-24 ENCOUNTER — Telehealth: Payer: Self-pay

## 2022-12-24 NOTE — Progress Notes (Signed)
Care Management & Coordination Services Pharmacy Team Pharmacy Assistant   Name: Lori Brady  MRN: [id number] DOB: 04/05/1946  Reason for Encounter: Appointment Reminder  Chart review:   Conditions to be addressed/monitored: CAD, HTN, HLD, COPD, Anxiety, GERD, Asthma, Allergic Rhinitis, Osteoporosis, and Atrial Tachycardia, Primary Cardiomyopathy, Senile Purpura, Subclavian Steal Syndrome of Left Subclavian Artery, Crohn's Disease of Colon w/o complication, Primary Parkinsonism, Symptomatic anemia, Hypercholesterolemia,    Recent office visits:  12/22/2022 [name], MD (PCP Office Visit) for Follow-up- No medication changes noted, No orders placed, Patient to follow-up in 4 months  10/26/2022 [name], MD (PCP Office Visit) for HTN- No medication changes noted, Lab orders placed, Patient to follow-up in 6 months  Recent consult visits:  12/21/2022 Joaquim Lai, MD (Oncology) for Follow-up- No medication changes noted, Lab orders placed, Patient to follow-up in 3 months  11/10/2022 Charlaine Dalton (Oncology) for Anemia- No medication changes noted, Lab orders placed, Referral to Gastroenterology order placed, BP: 146/57, Patient to follow-up in 6 weeks  11/04//202 Devona Konig, MD (Pulmonary) for Follow-up- Stopped: Prednisone 10 mg, Sulfamethoxazole, No orders placed, patient to follow-up in 6 months  08/12/2022 Hemang Leeanne Deed, MD (Neurology) for Parkinson's Disease- No medication changes noted, No orders placed, No follow-up noted  08/11/2022 Leotis Pain, MD (Vascular Surgery) for Benign Essential HTN- No medication changes noted, VAS US Carotid order placed, BP: 169/74, Patient to follow-up in 1 year  07/21/2022 Devona Konig, MD (Pulmonary) for Shortness of Breath- Started: Sulfamethoxazole-Trimethoprim 800-160 mg twice daily, No orders placed, BP: 148/62   Hospital visits:  None in previous 6 months  Medications: Outpatient Encounter Medications  as of 12/24/2022  Medication Sig Note   albuterol (ACCUNEB) 0.63 MG/3ML nebulizer solution Take 3 mLs (0.63 mg total) by nebulization every 6 (six) hours as needed for wheezing.    albuterol (VENTOLIN HFA) 108 (90 Base) MCG/ACT inhaler INHALE 2 PUFFS INTO THE LUNGS EVERY 6 HOURS AS NEEDED FOR WHEEZING OR SHORTNESS OF BREATH    alendronate (FOSAMAX) 70 MG tablet Take 1 tablet (70 mg total) by mouth once a week. Take with a full glass of water on an empty stomach.    aspirin 81 MG EC tablet Take by mouth.    atorvastatin (LIPITOR) 40 MG tablet Take 1 tablet (40 mg total) by mouth at bedtime.    azelastine (ASTELIN) 0.1 % nasal spray Place 2 sprays into both nostrils daily at 6 (six) AM.    betamethasone valerate (VALISONE) 0.1 % cream APPLY TOPICALLY TO AFFECTED AREA SPARINGLY 2 TIMES DAILY. 01/23/2021: As needed   BREO ELLIPTA 100-25 MCG/ACT AEPB INHALE 1 PUFF BY MOUTH ONCE DAILY AS DIRECTED    carbidopa-levodopa (SINEMET CR) 50-200 MG tablet Take 1 tablet by mouth daily. Before dose    carbidopa-levodopa (SINEMET CR) 50-200 MG tablet Take 1 tablet by mouth at bedtime.    carbidopa-levodopa (SINEMET IR) 25-100 MG tablet 3 pill in the morning, then 2 pills two times a day    cetirizine (ZYRTEC) 10 MG tablet TAKE 1 TABLET BY MOUTH ONCE DAILY    Cholecalciferol (VITAMIN D) 2000 UNITS CAPS Take 1 capsule by mouth daily.    citalopram (CELEXA) 20 MG tablet TAKE 1 TABLET BY MOUTH ONCE DAILY    clobetasol (TEMOVATE) 0.05 % external solution Apply 1 application topically 2 (two) times daily. As needed    clobetasol cream (TEMOVATE) [id number] % Apply 1 application topically 2 (two) times daily. As needed    doxepin (SINEQUAN) 25 MG capsule  TAKE 1 CAPSULE BY MOUTH AT BEDTIME    ferrous sulfate 325 (65 FE) MG EC tablet Take 325 mg by mouth 2 (two) times daily.    fluticasone (FLONASE) 50 MCG/ACT nasal spray     ibuprofen (ADVIL) 200 MG tablet Take 200 mg by mouth every 6 (six) hours as needed.    ketoconazole  (NIZORAL) 2 % shampoo Apply 1 application. topically 2 (two) times a week.    melatonin 5 MG TABS Take 1 tablet by mouth at bedtime.    meloxicam (MOBIC) 7.5 MG tablet TAKE 1 TABLET BY MOUTH ONCE DAILY    Meloxicam 10 MG CAPS     montelukast (SINGULAIR) 10 MG tablet TAKE 1 TABLET BY MOUTH ONCE EVERY EVENING    omeprazole (PRILOSEC) 40 MG capsule TAKE 1 CAPSULE BY MOUTH ONCE DAILY    simvastatin (ZOCOR) 40 MG tablet TAKE 1 TABLET BY MOUTH ONCE EVERY EVENING    SPIRIVA RESPIMAT 2.5 MCG/ACT AERS INHALE 2 PUFFS BY MOUTH ONCE DAILY FOR COPD    traMADol (ULTRAM) 50 MG tablet     No facility-administered encounter medications on file as of 12/24/2022.   Contacted patient to confirm in office appointment with Junius Argyle, CPP on 12/28/2022 at 1100. Spoke with patient on 12/24/2022   Do you have any problems getting your medications? Yes patient is currently with [facility] and she stated she doesn't care for them because they mess up her medications sometimes, and they have started charging her $2.00 per delivery. Patient is on a fixed income. Patient is currently interested in transferring to [facility].  If yes what types of problems are you experiencing? Access barriers, no financial issues at this time.  What is your top health concern you would like to discuss at your upcoming visit? Patient doesn't have any major health concerns but she does have Parkinson's.  Have you seen any other providers since your last visit with PCP? No  Star Rating Drugs:  Atorvastatin 40 mg last filled on 08/31/2021 for a 90-Day supply with [facility]-Still in active Med List Simvastatin 40 mg last filled on 10/12/2022 for a 90-Day supply with [facility]   Care Gaps: Annual wellness visit in last year? Yes  I spoke with the patient and she does also check her blood pressures at home sometimes but not often. Per patient she is not on any special diet.  Lynann Bologna, CPA/CMA Clinical Pharmacist  Assistant Phone: [phone]   02/08- LVM

## 2022-12-28 ENCOUNTER — Ambulatory Visit: Payer: Medicare PPO

## 2022-12-28 DIAGNOSIS — E7800 Pure hypercholesterolemia, unspecified: Secondary | ICD-10-CM

## 2022-12-28 DIAGNOSIS — J4489 Other specified chronic obstructive pulmonary disease: Secondary | ICD-10-CM

## 2022-12-28 DIAGNOSIS — K219 Gastro-esophageal reflux disease without esophagitis: Secondary | ICD-10-CM

## 2022-12-28 DIAGNOSIS — I10 Essential (primary) hypertension: Secondary | ICD-10-CM

## 2022-12-28 DIAGNOSIS — J45909 Unspecified asthma, uncomplicated: Secondary | ICD-10-CM

## 2022-12-28 DIAGNOSIS — M810 Age-related osteoporosis without current pathological fracture: Secondary | ICD-10-CM

## 2022-12-28 DIAGNOSIS — F419 Anxiety disorder, unspecified: Secondary | ICD-10-CM

## 2022-12-28 MED ORDER — OMEPRAZOLE 40 MG PO CPDR: 40.0000 mg | DELAYED_RELEASE_CAPSULE | Freq: Every day | ORAL | 1 refills | Status: DC

## 2022-12-28 MED ORDER — TRELEGY ELLIPTA 200-62.5-25 MCG/ACT IN AEPB: 1.0000 | INHALATION_SPRAY | Freq: Every day | RESPIRATORY_TRACT | 0 refills | Status: DC

## 2022-12-28 MED ORDER — ATORVASTATIN CALCIUM 40 MG PO TABS: 40.0000 mg | ORAL_TABLET | Freq: Every day | ORAL | 0 refills | Status: DC

## 2022-12-28 MED ORDER — MONTELUKAST SODIUM 10 MG PO TABS: 10.0000 mg | ORAL_TABLET | ORAL | 1 refills | Status: DC

## 2022-12-28 MED ORDER — ALENDRONATE SODIUM 70 MG PO TABS: 70.0000 mg | ORAL_TABLET | ORAL | 3 refills | Status: DC

## 2022-12-28 MED ORDER — DOXEPIN HCL 25 MG PO CAPS: 25.0000 mg | ORAL_CAPSULE | Freq: Every day | ORAL | 1 refills | Status: DC

## 2022-12-28 MED ORDER — CITALOPRAM HYDROBROMIDE 20 MG PO TABS: 20.0000 mg | ORAL_TABLET | Freq: Every day | ORAL | 1 refills | Status: DC

## 2022-12-28 NOTE — Progress Notes (Signed)
Care Management & Coordination Services Pharmacy Note  12/28/2022 Name:  Lori Brady MRN:  [id number] DOB:  1946-05-31  Summary: Patient presents for initial pharmacy consult. She is accompanied by her sister Hoyle Sauer, although the patient provides all of the history.   -Patient reports her breathing has improved recently. Discussed benefits of triple therapy inhaler for compliance and ease of administration vs utilization of two inhalers. Sample of Trelegy was provided today and patient was trial to see which she prefers.   -Atorvastatin and simvastatin both on patient medication list. Fill history suggests patient is taking simvastatin. Given ASCVD history, would recommend high-intensity statin.   Recommendations/Changes made from today's visit: Onboard to [facility] for compliance packaging today.   -STOP Georgia Lopes for the next week -START Trelegy 1 puff daily for 14 days -STOP Simvastatin -START Atorvastatin 40 mg daily   Follow up plan: -HC will check in with patient to assess COPD symptoms and see if patient prefers Trelegy or Breo + Spiriva.    Subjective: Lori Brady is an 77 y.o. year old female who is a primary patient of Bacigalupo, Dionne Bucy, MD.  The care coordination team was consulted for assistance with disease management and care coordination needs.    Engaged with patient face to face for initial visit.  Recent office visits: 12/22/2022 [name], MD (PCP Office Visit) for Follow-up- No medication changes noted, No orders placed, Patient to follow-up in 4 months   10/26/2022 [name], MD (PCP Office Visit) for HTN- No medication changes noted, Lab orders placed, Patient to follow-up in 6 months  Recent consult visits: 12/21/2022 Joaquim Lai, MD (Oncology) for Follow-up- No medication changes noted, Lab orders placed, Patient to follow-up in 3 months   11/10/2022 Charlaine Dalton (Oncology) for Anemia- No medication changes  noted, Lab orders placed, Referral to Gastroenterology order placed, BP: 146/57, Patient to follow-up in 6 weeks   11/04//202 Devona Konig, MD (Pulmonary) for Follow-up- Stopped: Prednisone 10 mg, Sulfamethoxazole, No orders placed, patient to follow-up in 6 months   08/12/2022 Hemang Leeanne Deed, MD (Neurology) for Parkinson's Disease- No medication changes noted, No orders placed, No follow-up noted   08/11/2022 Leotis Pain, MD (Vascular Surgery) for Benign Essential HTN- No medication changes noted, VAS US Carotid order placed, BP: 169/74, Patient to follow-up in 1 year   07/21/2022 Devona Konig, MD (Pulmonary) for Shortness of Breath- Started: Sulfamethoxazole-Trimethoprim 800-160 mg twice daily, No orders placed, BP: 148/62   Hospital visits: None in previous 6 months   Objective:  Lab Results  Component Value Date   CREATININE 0.74 12/21/2022   BUN 16 12/21/2022   EGFR 88 10/26/2022   GFRNONAA >60 12/21/2022   GFRAA 83 07/25/2020   NA 138 12/21/2022   K 3.9 12/21/2022   CALCIUM 9.1 12/21/2022   CO2 26 12/21/2022   GLUCOSE 94 12/21/2022    Lab Results  Component Value Date/Time   HGBA1C 5.8 (H) 10/26/2022 01:39 PM   HGBA1C 5.3 01/26/2017 12:08 AM    Last diabetic Eye exam: No results found for: "HMDIABEYEEXA"  Last diabetic Foot exam: No results found for: "HMDIABFOOTEX"   Lab Results  Component Value Date   CHOL 141 10/26/2022   HDL 40 10/26/2022   LDLCALC 75 10/26/2022   TRIG 152 (H) 10/26/2022   CHOLHDL 3.5 10/26/2022       Latest Ref Rng & Units 10/26/2022    1:39 PM 12/15/2021    2:34 PM 01/28/2021    1:05 PM  Hepatic Function  Total Protein 6.0 - 8.5 g/dL 6.7  7.0  7.6   Albumin 3.8 - 4.8 g/dL 4.2  4.5  4.8   AST 0 - 40 IU/L 19  28  59   ALT 0 - 32 IU/L 9  6  23   $ Alk Phosphatase 44 - 121 IU/L 101  153  142   Total Bilirubin 0.0 - 1.2 mg/dL 0.5  0.5  0.7     Lab Results  Component Value Date/Time   TSH 1.580 01/04/2018 10:49 AM   TSH 6.432  (H) 01/26/2017 12:08 AM   TSH 4.280 04/02/2016 09:24 AM   FREET4 0.79 01/26/2017 12:08 AM       Latest Ref Rng & Units 12/21/2022   12:41 PM 10/26/2022    1:39 PM 12/15/2021    2:34 PM  CBC  WBC 4.0 - 10.5 K/uL 9.3  9.8  10.6   Hemoglobin 12.0 - 15.0 g/dL 11.3  8.3  10.5   Hematocrit 36.0 - 46.0 % 36.2  28.2  33.7   Platelets 150 - 400 K/uL 176  209  234     Lab Results  Component Value Date/Time   VD25OH 38.3 12/15/2021 02:34 PM   VITAMINB12 300 01/22/2020 03:13 PM    Clinical ASCVD: Yes  The ASCVD Risk score (Arnett DK, et al., 2019) failed to calculate for the following reasons:   The patient has a prior MI or stroke diagnosis       12/22/2022    2:40 PM 10/26/2022    1:44 PM 04/14/2022   11:02 AM  Depression screen PHQ 2/9  Decreased Interest 0 0 0  Down, Depressed, Hopeless 0 0 0  PHQ - 2 Score 0 0 0  Altered sleeping 1 0   Tired, decreased energy 1 2   Change in appetite 0 1   Feeling bad or failure about yourself  0 0   Trouble concentrating 0 0   Moving slowly or fidgety/restless 1 0   Suicidal thoughts 0 0   PHQ-9 Score 3 3   Difficult doing work/chores Not difficult at all Not difficult at all      Social History   Tobacco Use  Smoking Status Former   Packs/day: 1.00   Years: 30.00   Total pack years: 30.00   Types: Cigarettes   Quit date: 08/01/2008   Years since quitting: 14.4  Smokeless Tobacco Never   BP Readings from Last 3 Encounters:  12/22/22 133/81  12/21/22 104/75  11/10/22 (!) 146/57   Pulse Readings from Last 3 Encounters:  12/22/22 80  12/21/22 81  11/10/22 93   Wt Readings from Last 3 Encounters:  12/22/22 126 lb (57.2 kg)  12/21/22 126 lb (57.2 kg)  11/10/22 127 lb 3.2 oz (57.7 kg)   BMI Readings from Last 3 Encounters:  12/22/22 23.81 kg/m  12/21/22 23.81 kg/m  11/10/22 24.03 kg/m    Allergies  Allergen Reactions   Levofloxacin Nausea Only and Nausea And Vomiting    Can take if given something for n/v   Vicodin  [Hydrocodone-Acetaminophen] Nausea Only    Medications Reviewed Today     Reviewed by Virginia Crews, MD (Physician) on 12/22/22 at 1445  Med List Status: <None>   Medication Order Taking? Sig Documenting Provider Last Dose Status Informant  albuterol (ACCUNEB) 0.63 MG/3ML nebulizer solution [id number] Yes Take 3 mLs (0.63 mg total) by nebulization every 6 (six) hours as needed for wheezing. Allyne Gee, MD  Taking Active   albuterol (VENTOLIN HFA) 108 (90 Base) MCG/ACT inhaler [id number] Yes INHALE 2 PUFFS INTO THE LUNGS EVERY 6 HOURS AS NEEDED FOR WHEEZING OR SHORTNESS OF Rosemarie Beath, MD Taking Active   alendronate (FOSAMAX) 70 MG tablet [id number] Yes Take 1 tablet (70 mg total) by mouth once a week. Take with a full glass of water on an empty stomach. Virginia Crews, MD Taking Active   aspirin 81 MG EC tablet [id number] Yes Take by mouth. [provider] Taking Active   atorvastatin (LIPITOR) 40 MG tablet [id number] Yes Take 1 tablet (40 mg total) by mouth at bedtime. Virginia Crews, MD Taking Active   azelastine (ASTELIN) 0.1 % nasal spray [id number] Yes Place 2 sprays into both nostrils daily at 6 (six) AM. [provider] Taking Active   betamethasone valerate (VALISONE) 0.1 % cream [id number] Yes APPLY TOPICALLY TO AFFECTED AREA SPARINGLY 2 TIMES DAILY. Margarita Rana, MD Taking Active Self           Med Note [facility], [facility] A   Thu Jan 23, 2021  1:24 PM) As needed  BREO ELLIPTA 100-25 MCG/ACT AEPB [id number] Yes INHALE 1 PUFF BY MOUTH ONCE DAILY AS DIRECTED Allyne Gee, MD Taking Active   carbidopa-levodopa (SINEMET CR) 50-200 MG tablet [id number] Yes Take 1 tablet by mouth daily. Before dose [provider] Taking Active Self  carbidopa-levodopa (SINEMET CR) 50-200 MG tablet [id number] Yes Take 1 tablet by mouth at bedtime. [provider] Taking Active   carbidopa-levodopa (SINEMET IR) 25-100 MG tablet [id number] Yes 3 pill  in the morning, then 2 pills two times a day [provider] Taking Active   cetirizine (ZYRTEC) 10 MG tablet [id number] Yes TAKE 1 TABLET BY MOUTH ONCE DAILY Allyne Gee, MD Taking Active   Cholecalciferol (VITAMIN D) 2000 UNITS CAPS [id number] Yes Take 1 capsule by mouth daily. [provider] Taking Active Self  citalopram (CELEXA) 20 MG tablet [id number] Yes TAKE 1 TABLET BY MOUTH ONCE DAILY Bacigalupo, Dionne Bucy, MD Taking Active   clobetasol (TEMOVATE) 0.05 % external solution [id number] Yes Apply 1 application topically 2 (two) times daily. As needed [provider] Taking Active   clobetasol cream (TEMOVATE) 0.05 % [id number] Yes Apply 1 application topically 2 (two) times daily. As needed [provider] Taking Active   doxepin (SINEQUAN) 25 MG capsule [id number] Yes TAKE 1 CAPSULE BY MOUTH AT BEDTIME Simmons-Robinson, Makiera, MD Taking Active   ferrous sulfate 325 (65 FE) MG EC tablet [id number] Yes Take 325 mg by mouth 2 (two) times daily. [provider] Taking Active   fluticasone Asencion Islam) 50 MCG/ACT nasal spray [id number] Yes  [provider] Taking Active   ibuprofen (ADVIL) 200 MG tablet [id number] Yes Take 200 mg by mouth every 6 (six) hours as needed. [provider] Taking Active   ketoconazole (NIZORAL) 2 % shampoo [id number] Yes Apply 1 application. topically 2 (two) times a week. [provider] Taking Active   melatonin 5 MG TABS [id number] Yes Take 1 tablet by mouth at bedtime. [provider] Taking Active   meloxicam (MOBIC) 7.5 MG tablet [id number] Yes TAKE 1 TABLET BY MOUTH ONCE DAILY Brita Romp Dionne Bucy, MD Taking Active   Meloxicam 10 MG CAPS [id number] Yes  [provider] Taking Active   montelukast (SINGULAIR) 10 MG tablet [id number] Yes TAKE 1 TABLET BY MOUTH ONCE EVERY EVENING Rumball, Jake Church, DO Taking Active   omeprazole (PRILOSEC) 40 MG capsule  [id number] Yes TAKE 1 CAPSULE BY MOUTH  ONCE DAILY Myles Gip, DO Taking Active   simvastatin (ZOCOR) 40 MG tablet [id number] Yes TAKE 1 TABLET BY MOUTH ONCE EVERY EVENING [provider] Taking Active   SPIRIVA RESPIMAT 2.5 MCG/ACT AERS [id number] Yes INHALE 2 PUFFS BY MOUTH ONCE DAILY FOR COPD Allyne Gee, MD Taking Active   traMADol Veatrice Bourbon) 50 MG tablet [id number] Yes  [provider] Taking Active             SDOH:  (Social Determinants of Health) assessments and interventions performed: Yes SDOH Interventions    [place] Office Visit from 10/26/2022 in [place] from 01/29/2022 in [place] from 01/23/2021 in [place]  SDOH Interventions     Food Insecurity Interventions -- Intervention Not Indicated --  Housing Interventions -- Intervention Not Indicated --  Transportation Interventions -- Intervention Not Indicated --  Depression Interventions/Treatment  PHQ2-9 Score <4 Follow-up Not Indicated -- --  Financial Strain Interventions -- Intervention Not Indicated --  Physical Activity Interventions -- Intervention Not Indicated Patient Refused  Stress Interventions -- Intervention Not Indicated --  Social Connections Interventions -- Intervention Not Indicated --       Medication Assistance: None required.  Patient affirms current coverage meets needs.  Medication Access: Within the past 30 days, how often has patient missed a dose of medication? No Is a pillbox or other method used to improve adherence? Yes  Factors that may affect medication adherence? transportation problems Are meds synced by current pharmacy? No  Are meds delivered by current pharmacy? Yes  Does patient experience delays in picking up medications due to transportation concerns? No   [facility] Reviewed: Is patient disadvantaged to use [facility]?: No  Current Rx insurance plan:  [employer] Name and location of Current pharmacy:  [facility], [place]. [place] [address] Phone: [phone] Fax: [phone]  [facility] services reviewed with patient today?: Yes  Patient requests to transfer care to [facility]?: Yes   Verbal consent obtained for [facility] enhanced pharmacy services (medication synchronization, adherence packaging, delivery coordination). A medication sync plan was created to allow patient to get all medications delivered once every 30 to 90 days per patient preference. Patient understands they have freedom to choose pharmacy and clinical pharmacist will coordinate care between all prescribers and [facility].   Compliance/Adherence/Medication fill history: Care Gaps: [facility] Lung Cancer Screening Covid  AWV  Star-Rating Drugs: Simvastatin 40 mg last filled on 10/12/2022 for a 90-Day supply with [facility]   Assessment/Plan   Hyperlipidemia: (LDL goal < 55) -Uncontrolled -History of MI, Stroke  -Current treatment: Simvastatin 40 mg nightly  -Current treatment: Aspirin 81 mg daily  -Medications previously tried: NA  -Atorvastatin and simvastatin both on patient medication list. Fill history suggests patient is taking simvastatin. Given ASCVD history, would recommend high-intensity statin.  -STOP Simvastatin -START Atorvastatin 40 mg daily   COPD (Goal: control symptoms and prevent exacerbations) -Not ideally controlled -Former smoker, quit in 2009 -Current treatment  Breo 1 puff daily  Spiriva 2 puffs daily Albuterol nebulizer Ventolin HFA -Medications previously tried: NA  -Gold Grade: Gold 2 (FEV1 50-79%) -Current COPD Classification:  B (high sx, 0-1 moderate exacerbations, no hospitalizations) -MMRC/CAT score: 3 -Pulmonary function testing: FEV1 60%, FEV1/FVC 100% (May 2023)  -Exacerbations requiring treatment in last 6 months: None -  Patient reports  consistent use of maintenance inhaler -Frequency of rescue inhaler use: once weekly, has not required her nebulizer in 6 months  -Patient reports her breathing has improved recently. Discussed benefits of triple therapy inhaler for compliance and ease of administration vs utilization of two inhalers. Sample of Trelegy was provided today and patient was trial to see which she prefers.  -STOP Georgia Lopes for the next week -START Trelegy 1 puff daily for 14 days -[facility] will check in with patient to assess COPD symptoms and see if patient prefers Trelegy or Breo + Spiriva.   Osteoporosis(Goal Prevent fractures) -Controlled -Last DEXA Scan: 01/14/21   T-Score femoral neck: -2.4  T-Score total hip: -2.1  T-Score lumbar spine:  -2.0 -Patient is a candidate for pharmacologic treatment due to T-Score < -2.5 in femoral neck -Current treatment  Alendronate 70 mg weekly (started 2012 or 2017) Thursdays - has 1 month  Vitamin D 2000 units daily  -Medications previously tried: Forteo  -Recommend 1200 mg of calcium daily from dietary and supplemental sources. -Recommended to continue current medication  Junius Argyle, PharmD, [place], [place] Pharmacist Practitioner  [facility] [phone]

## 2022-12-28 NOTE — Patient Instructions (Addendum)
Visit Information It was great speaking with you today!  Please let me know if you have any questions about our visit.  Plan:  Try the Trelegy inhaler in place of your Breo and Spiriva inhalers. Take 1 puff each morning.  We are going to STOP simvastatin and START Atorvastatin 40 mg daily   Print copy of patient instructions, educational materials, and care plan provided in person.  Telephone follow up appointment with pharmacy team member scheduled for: 03/29/2023 at 11:00 AM  Junius Argyle, PharmD, [place], CPP  Clinical Pharmacist Practitioner  [facility] [place], [place] Pharmacist Assistant Phone: [phone]

## 2022-12-29 ENCOUNTER — Telehealth: Payer: Self-pay

## 2022-12-29 NOTE — Progress Notes (Signed)
Care Coordination Pharmacy Assistant   Name: Lori Brady  MRN: [id number] DOB: 10/14/1946  Reason for Encounter: [name] for [facility]   CPP sent me a task requesting that I start the Onboarding process for the patient to be on boarded with [facility].  Onboarding started and form uploaded to CPP for review.   Medications: Outpatient Encounter Medications as of 12/29/2022  Medication Sig Note   albuterol (ACCUNEB) 0.63 MG/3ML nebulizer solution Take 3 mLs (0.63 mg total) by nebulization every 6 (six) hours as needed for wheezing.    albuterol (VENTOLIN HFA) 108 (90 Base) MCG/ACT inhaler INHALE 2 PUFFS INTO THE LUNGS EVERY 6 HOURS AS NEEDED FOR WHEEZING OR SHORTNESS OF BREATH    alendronate (FOSAMAX) 70 MG tablet Take 1 tablet (70 mg total) by mouth once a week. Take with a full glass of water on an empty stomach.    aspirin 81 MG EC tablet Take 81 mg by mouth daily with breakfast.    atorvastatin (LIPITOR) 40 MG tablet Take 1 tablet (40 mg total) by mouth at bedtime.    azelastine (ASTELIN) 0.1 % nasal spray Place 2 sprays into both nostrils daily at 6 (six) AM.    betamethasone valerate (VALISONE) 0.1 % cream APPLY TOPICALLY TO AFFECTED AREA SPARINGLY 2 TIMES DAILY. 01/23/2021: As needed   carbidopa-levodopa (SINEMET CR) 50-200 MG tablet Take 1 tablet by mouth at bedtime.    carbidopa-levodopa (SINEMET IR) 25-100 MG tablet Take 3 tablets with breakfast, 2 tablets with lunch, 2 tablets with supper.    cetirizine (ZYRTEC) 10 MG tablet TAKE 1 TABLET BY MOUTH ONCE DAILY    Cholecalciferol (VITAMIN D) 2000 UNITS CAPS Take 1 capsule by mouth daily.    citalopram (CELEXA) 20 MG tablet Take 1 tablet (20 mg total) by mouth daily.    clobetasol (TEMOVATE) 0.05 % external solution Apply 1 application topically 2 (two) times daily. As needed    clobetasol cream (TEMOVATE) [id number] % Apply 1 application topically 2 (two) times daily. As needed    doxepin (SINEQUAN) 25 MG capsule Take 1  capsule (25 mg total) by mouth at bedtime.    ferrous sulfate 325 (65 FE) MG EC tablet Take 650 mg by mouth daily with breakfast.    Fluticasone-Umeclidin-Vilant (TRELEGY ELLIPTA) 200-62.5-25 MCG/ACT AEPB Inhale 1 puff into the lungs daily.    ibuprofen (ADVIL) 200 MG tablet Take 200 mg by mouth every 6 (six) hours as needed.    ketoconazole (NIZORAL) 2 % shampoo Apply 1 application. topically 2 (two) times a week.    melatonin 5 MG TABS Take 1 tablet by mouth at bedtime.    montelukast (SINGULAIR) 10 MG tablet Take 1 tablet (10 mg total) by mouth every morning.    omeprazole (PRILOSEC) 40 MG capsule Take 1 capsule (40 mg total) by mouth at bedtime.    No facility-administered encounter medications on file as of 12/29/2022.   Lynann Bologna, CPA/CMA Clinical Pharmacist Assistant Phone: [phone]

## 2022-12-31 ENCOUNTER — Other Ambulatory Visit: Payer: Self-pay | Admitting: Family Medicine

## 2022-12-31 DIAGNOSIS — M810 Age-related osteoporosis without current pathological fracture: Secondary | ICD-10-CM

## 2022-12-31 NOTE — Telephone Encounter (Signed)
Requested medication (s) are due for refill today:   Yes  Requested medication (s) are on the active medication list:   Yes  Future visit scheduled:   Yes   Seen a wk ago   Last ordered: 12/28/2022 #12, 3 refills  Returned because labs and bone density are due so unable to refill per protocol.     It looks like 2 different pharmacies are involved.    [facility] and [facility].   Not sure which this needs to go to.   Requested Prescriptions  Pending Prescriptions Disp Refills   alendronate (FOSAMAX) 70 MG tablet [Pharmacy Med Name: ALENDRONATE SODIUM 70 MG TAB] 12 tablet 3    Sig: TAKE ONE TABLET BY MOUTH EACH WEEK, ON AN EMPTY STOMACH BEFORE BREAKFAST WITH 8oz OF WATER AND REMAIN UPRIGHT FOR :28     Endocrinology:  Bisphosphonates Failed - 12/31/2022 11:58 AM      Failed - Vitamin D in normal range and within 360 days    Vit D, 25-Hydroxy  Date Value Ref Range Status  12/15/2021 38.3 30.0 - 100.0 ng/mL Final    Comment:    Vitamin D deficiency has been defined by the Institute of Medicine and an Endocrine Society practice guideline as a level of serum 25-OH vitamin D less than 20 ng/mL (1,2). The Endocrine Society went on to further define vitamin D insufficiency as a level between 21 and 29 ng/mL (2). 1. IOM (Institute of Medicine). 2010. Dietary reference    intakes for calcium and D. [place]: The    [employer]. 2. Holick MF, Binkley Emmaus, Bischoff-Ferrari HA, et al.    Evaluation, treatment, and prevention of vitamin D    deficiency: an Endocrine Society clinical practice    guideline. JCEM. 2011 Jul; 96(7):1911-30.          Failed - Mg Level in normal range and within 360 days    Magnesium  Date Value Ref Range Status  07/18/2018 2.1 1.7 - 2.4 mg/dL Final    Comment:    Performed at [facility], [address] [place], [place] [address]         Failed - Phosphate in normal range and within 360 days    Phosphorus  Date Value Ref Range Status   08/24/2018 3.1 2.5 - 4.5 mg/dL Final         Failed - Bone Mineral Density or Dexa Scan completed in the last 2 years      Passed - Ca in normal range and within 360 days    Calcium  Date Value Ref Range Status  12/21/2022 9.1 8.9 - 10.3 mg/dL Final   Calcium, Total  Date Value Ref Range Status  03/10/2015 8.6 (L) mg/dL Final    Comment:    8.9-10.3 NOTE: New Reference Range  01/22/15          Passed - Cr in normal range and within 360 days    Creatinine  Date Value Ref Range Status  03/10/2015 0.79 mg/dL Final    Comment:    0.44-1.00 NOTE: New Reference Range  01/22/15    Creatinine, Ser  Date Value Ref Range Status  12/21/2022 0.74 0.44 - 1.00 mg/dL Final         Passed - eGFR is 30 or above and within 360 days    EGFR (African American)  Date Value Ref Range Status  03/10/2015 >60  Final   GFR calc Af Wyvonnia Lora  Date Value Ref Range  Status  07/25/2020 83 >59 mL/min/1.73 Final    Comment:    **[facility] currently reports eGFR in compliance with the current**   recommendations of the [employer]. [facility] will   update reporting as new guidelines are published from the NKF-ASN   Task force.    EGFR (Non-African Amer.)  Date Value Ref Range Status  03/10/2015 >60  Final    Comment:    eGFR values <76m/min/1.73 m2 may be an indication of chronic kidney disease (CKD). Calculated eGFR is useful in patients with stable renal function. The eGFR calculation will not be reliable in acutely ill patients when serum creatinine is changing rapidly. It is not useful in patients on dialysis. The eGFR calculation may not be applicable to patients at the low and high extremes of body sizes, pregnant women, and vegetarians.    GFR, Estimated  Date Value Ref Range Status  12/21/2022 >60 >60 mL/min Final    Comment:    (NOTE) Calculated using the CKD-EPI Creatinine Equation (2021)    eGFR  Date Value Ref Range Status  10/26/2022 88 >59 mL/min/1.73  Final         Passed - Valid encounter within last 12 months    Recent Outpatient Visits           1 week ago Benign essential HTN   [place] [facility]   2 months ago Benign essential HTN   [place] [facility] [place]   8 months ago Encounter for annual physical exam   [address] [facility]   1 year ago Benign essential HTN   [place] [place]   1 year ago Encounter for annual physical exam   [place] [place]       Future Appointments             In 3 months Bacigalupo, ADionne Bucy MD [facility]

## 2023-01-05 ENCOUNTER — Telehealth: Payer: Self-pay

## 2023-01-05 NOTE — Progress Notes (Signed)
Care Coordination Pharmacy Assistant   Name: Lori Brady  MRN: [id number] DOB: 10-14-1946  Reason for Encounter: COPD Follow-up/New Medication Follow-up  I spoke to the patient, and she reports that she is taking the Trelegy as directed and he breathing has improved. Per patient she would like to continue on with this medication and is okay with [facility] to send prescription to [facility]. Patient would like to know how much this medication is going to cost her prior to delivery. I advised her that I would inform the pharmacy to provide pricing prior to delivery to make sure patient is okay with the cost.    Current COPD regimen:  Trelegy 1 puff daily  Spiriva 2 puffs daily Albuterol nebulizer Ventolin HFA      No data to display         Any recent hospitalizations or ED visits since last visit with CPP? No  Reports denies COPD symptoms, including Increased shortness of breath , Shortness of breath at rest, and Wheezing  What recent interventions/DTPs have been made by any provider to improve breathing since last visit: Have you had exacerbation/flare-up since last visit? No What do you do when you are short of breath?  Rescue medication and Rest  Respiratory Devices/Equipment Do you have a nebulizer? Yes Do you use a Peak Flow Meter? No Do you use a maintenance inhaler? Yes How often do you forget to use your daily inhaler? Never Do you use a rescue inhaler? Yes How often do you use your rescue inhaler?  1-2x per week Do you use a spacer with your inhaler? No  Adherence Review: Does the patient have >5 day gap between last estimated fill date for maintenance inhaler medications? No  Medications: Outpatient Encounter Medications as of 01/05/2023  Medication Sig Note   albuterol (ACCUNEB) 0.63 MG/3ML nebulizer solution Take 3 mLs (0.63 mg total) by nebulization every 6 (six) hours as needed for wheezing.    albuterol (VENTOLIN HFA) 108 (90 Base) MCG/ACT inhaler INHALE 2  PUFFS INTO THE LUNGS EVERY 6 HOURS AS NEEDED FOR WHEEZING OR SHORTNESS OF BREATH    alendronate (FOSAMAX) 70 MG tablet TAKE ONE TABLET BY MOUTH EACH WEEK, ON AN EMPTY STOMACH BEFORE BREAKFAST WITH 8oz OF WATER AND REMAIN UPRIGHT FOR :30    aspirin 81 MG EC tablet Take 81 mg by mouth daily with breakfast.    atorvastatin (LIPITOR) 40 MG tablet Take 1 tablet (40 mg total) by mouth at bedtime.    azelastine (ASTELIN) 0.1 % nasal spray Place 2 sprays into both nostrils daily at 6 (six) AM.    betamethasone valerate (VALISONE) 0.1 % cream APPLY TOPICALLY TO AFFECTED AREA SPARINGLY 2 TIMES DAILY. 01/23/2021: As needed   carbidopa-levodopa (SINEMET CR) 50-200 MG tablet Take 1 tablet by mouth at bedtime.    carbidopa-levodopa (SINEMET IR) 25-100 MG tablet Take 3 tablets with breakfast, 2 tablets with lunch, 2 tablets with supper.    cetirizine (ZYRTEC) 10 MG tablet TAKE 1 TABLET BY MOUTH ONCE DAILY    Cholecalciferol (VITAMIN D) 2000 UNITS CAPS Take 1 capsule by mouth daily.    citalopram (CELEXA) 20 MG tablet Take 1 tablet (20 mg total) by mouth daily.    clobetasol (TEMOVATE) 0.05 % external solution Apply 1 application topically 2 (two) times daily. As needed    clobetasol cream (TEMOVATE) [id number] % Apply 1 application topically 2 (two) times daily. As needed    doxepin (SINEQUAN) 25 MG capsule Take 1 capsule (25  mg total) by mouth at bedtime.    ferrous sulfate 325 (65 FE) MG EC tablet Take 650 mg by mouth daily with breakfast.    Fluticasone-Umeclidin-Vilant (TRELEGY ELLIPTA) 200-62.5-25 MCG/ACT AEPB Inhale 1 puff into the lungs daily.    ibuprofen (ADVIL) 200 MG tablet Take 200 mg by mouth every 6 (six) hours as needed.    ketoconazole (NIZORAL) 2 % shampoo Apply 1 application. topically 2 (two) times a week.    melatonin 5 MG TABS Take 1 tablet by mouth at bedtime.    montelukast (SINGULAIR) 10 MG tablet Take 1 tablet (10 mg total) by mouth every morning.    omeprazole (PRILOSEC) 40 MG capsule Take 1  capsule (40 mg total) by mouth at bedtime.    No facility-administered encounter medications on file as of 01/05/2023.    Lynann Bologna, CPA/CMA Clinical Pharmacist Assistant Phone: [phone]

## 2023-01-19 ENCOUNTER — Ambulatory Visit: Payer: Medicare PPO | Admitting: Internal Medicine

## 2023-02-02 ENCOUNTER — Telehealth: Payer: Self-pay

## 2023-02-02 DIAGNOSIS — J4489 Other specified chronic obstructive pulmonary disease: Secondary | ICD-10-CM

## 2023-02-02 NOTE — Telephone Encounter (Signed)
Can you please check in with the patient?

## 2023-02-02 NOTE — Telephone Encounter (Signed)
Copied from [place] [id number]. Topic: General - Inquiry >> Feb 02, 2023  1:13 PM Lori Brady P wrote: Reason for CRM: pt called saying she has left several to [facility] about her medications.  She is trying to get her medications with [facility] and she has not heard anything back.  CB#  [phone]

## 2023-02-03 ENCOUNTER — Ambulatory Visit (INDEPENDENT_AMBULATORY_CARE_PROVIDER_SITE_OTHER): Payer: Medicare PPO

## 2023-02-03 ENCOUNTER — Telehealth: Payer: Self-pay

## 2023-02-03 ENCOUNTER — Other Ambulatory Visit: Payer: Self-pay

## 2023-02-03 VITALS — Ht 61.0 in | Wt 126.0 lb

## 2023-02-03 DIAGNOSIS — Z0000 Encounter for general adult medical examination without abnormal findings: Secondary | ICD-10-CM | POA: Diagnosis not present

## 2023-02-03 DIAGNOSIS — D649 Anemia, unspecified: Secondary | ICD-10-CM

## 2023-02-03 MED ORDER — ALBUTEROL SULFATE 0.63 MG/3ML IN NEBU: 1.0000 | INHALATION_SOLUTION | Freq: Four times a day (QID) | RESPIRATORY_TRACT | 1 refills | Status: DC | PRN

## 2023-02-03 MED ORDER — TRELEGY ELLIPTA 100-62.5-25 MCG/ACT IN AEPB: 1.0000 | INHALATION_SPRAY | Freq: Every day | RESPIRATORY_TRACT | 11 refills | Status: DC

## 2023-02-03 MED ORDER — ALBUTEROL SULFATE HFA 108 (90 BASE) MCG/ACT IN AERS: 2.0000 | INHALATION_SPRAY | Freq: Four times a day (QID) | RESPIRATORY_TRACT | 3 refills | Status: DC | PRN

## 2023-02-03 MED ORDER — ALBUTEROL SULFATE HFA 108 (90 BASE) MCG/ACT IN AERS: 2.0000 | INHALATION_SPRAY | Freq: Four times a day (QID) | RESPIRATORY_TRACT | 1 refills | Status: DC | PRN

## 2023-02-03 NOTE — Addendum Note (Signed)
Addended by: Germaine Pomfret on: 02/03/2023 09:32 AM   Modules accepted: Orders

## 2023-02-03 NOTE — Telephone Encounter (Signed)
Pt primary care office called that pt change phar to [facility] they like [place] to send her albuterol inhaler and neb soln [facility]

## 2023-02-03 NOTE — Progress Notes (Cosign Needed Addendum)
I connected with  Lori Brady on 02/03/23 by a audio enabled telemedicine application and verified that I am speaking with the correct person using two identifiers.  Patient Location: Home  Provider Location: Office/Clinic  I discussed the limitations of evaluation and management by telemedicine. The patient expressed understanding and agreed to proceed.  Subjective:   Lori Brady is a 77 y.o. female who presents for Medicare Annual (Subsequent) preventive examination.  Review of Systems    Cardiac Risk Factors include: advanced age (>32men, >10 women);dyslipidemia;hypertension;sedentary lifestyle     Objective:    Today's Vitals   02/03/23 1303  Weight: 126 lb (57.2 kg)  Height: 5\' 1"  (1.549 m)   Body mass index is 23.81 kg/m.     02/03/2023    1:10 PM 12/21/2022   12:57 PM 06/01/2022    7:24 AM 01/29/2022    1:08 PM 01/06/2022    9:52 PM 01/23/2021    1:32 PM 01/22/2020   10:31 AM  Advanced Directives  Does Patient Have a Medical Advance Directive? No No No No No No No  Would patient like information on creating a medical advance directive?    No - Patient declined Yes (ED - Information included in AVS) No - Patient declined No - Patient declined    Current Medications (verified) Outpatient Encounter Medications as of 02/03/2023  Medication Sig   albuterol (ACCUNEB) 0.63 MG/3ML nebulizer solution Take 3 mLs (0.63 mg total) by nebulization every 6 (six) hours as needed for wheezing.   albuterol (VENTOLIN HFA) 108 (90 Base) MCG/ACT inhaler Inhale 2 puffs into the lungs every 6 (six) hours as needed for wheezing or shortness of breath.   albuterol (VENTOLIN HFA) 108 (90 Base) MCG/ACT inhaler Inhale 2 puffs into the lungs every 6 (six) hours as needed for wheezing or shortness of breath.   alendronate (FOSAMAX) 70 MG tablet TAKE ONE TABLET BY MOUTH EACH WEEK, ON AN EMPTY STOMACH BEFORE BREAKFAST WITH 8oz OF WATER AND REMAIN UPRIGHT FOR :30   aspirin 81 MG EC tablet Take 81 mg  by mouth daily with breakfast.   atorvastatin (LIPITOR) 40 MG tablet Take 1 tablet (40 mg total) by mouth at bedtime.   azelastine (ASTELIN) 0.1 % nasal spray Place 2 sprays into both nostrils daily at 6 (six) AM.   betamethasone valerate (VALISONE) 0.1 % cream APPLY TOPICALLY TO AFFECTED AREA SPARINGLY 2 TIMES DAILY.   carbidopa-levodopa (SINEMET CR) 50-200 MG tablet Take 1 tablet by mouth at bedtime.   carbidopa-levodopa (SINEMET IR) 25-100 MG tablet Take 3 tablets with breakfast, 2 tablets with lunch, 2 tablets with supper.   cetirizine (ZYRTEC) 10 MG tablet TAKE 1 TABLET BY MOUTH ONCE DAILY   Cholecalciferol (VITAMIN D) 2000 UNITS CAPS Take 1 capsule by mouth daily.   citalopram (CELEXA) 20 MG tablet Take 1 tablet (20 mg total) by mouth daily.   clobetasol (TEMOVATE) 0.05 % external solution Apply 1 application topically 2 (two) times daily. As needed   clobetasol cream (TEMOVATE) [id number] % Apply 1 application topically 2 (two) times daily. As needed   doxepin (SINEQUAN) 25 MG capsule Take 1 capsule (25 mg total) by mouth at bedtime.   ferrous sulfate 325 (65 FE) MG EC tablet Take 650 mg by mouth daily with breakfast.   Fluticasone-Umeclidin-Vilant (TRELEGY ELLIPTA) 100-62.5-25 MCG/ACT AEPB Inhale 1 puff into the lungs daily.   ibuprofen (ADVIL) 200 MG tablet Take 200 mg by mouth every 6 (six) hours as needed.   ketoconazole (  NIZORAL) 2 % shampoo Apply 1 application. topically 2 (two) times a week.   melatonin 5 MG TABS Take 1 tablet by mouth at bedtime.   montelukast (SINGULAIR) 10 MG tablet Take 1 tablet (10 mg total) by mouth every morning.   omeprazole (PRILOSEC) 40 MG capsule Take 1 capsule (40 mg total) by mouth at bedtime.   [DISCONTINUED] albuterol (ACCUNEB) 0.63 MG/3ML nebulizer solution Take 3 mLs (0.63 mg total) by nebulization every 6 (six) hours as needed for wheezing.   [DISCONTINUED] albuterol (VENTOLIN HFA) 108 (90 Base) MCG/ACT inhaler INHALE 2 PUFFS INTO THE LUNGS EVERY 6 HOURS  AS NEEDED FOR WHEEZING OR SHORTNESS OF BREATH   [DISCONTINUED] alendronate (FOSAMAX) 70 MG tablet Take 1 tablet (70 mg total) by mouth once a week. Take with a full glass of water on an empty stomach.   [DISCONTINUED] atorvastatin (LIPITOR) 40 MG tablet Take 1 tablet (40 mg total) by mouth at bedtime. (Patient not taking: Reported on 12/28/2022)   [DISCONTINUED] BREO ELLIPTA 100-25 MCG/ACT AEPB INHALE 1 PUFF BY MOUTH ONCE DAILY AS DIRECTED   [DISCONTINUED] carbidopa-levodopa (SINEMET CR) 50-200 MG tablet Take 1 tablet by mouth daily. Before dose   [DISCONTINUED] citalopram (CELEXA) 20 MG tablet TAKE 1 TABLET BY MOUTH ONCE DAILY   [DISCONTINUED] doxepin (SINEQUAN) 25 MG capsule TAKE 1 CAPSULE BY MOUTH AT BEDTIME   [DISCONTINUED] fluticasone (FLONASE) 50 MCG/ACT nasal spray    [DISCONTINUED] meloxicam (MOBIC) 7.5 MG tablet TAKE 1 TABLET BY MOUTH ONCE DAILY   [DISCONTINUED] Meloxicam 10 MG CAPS    [DISCONTINUED] montelukast (SINGULAIR) 10 MG tablet TAKE 1 TABLET BY MOUTH ONCE EVERY EVENING (Patient taking differently: Take 10 mg by mouth every morning.)   [DISCONTINUED] omeprazole (PRILOSEC) 40 MG capsule TAKE 1 CAPSULE BY MOUTH ONCE DAILY (Patient taking differently: Take 40 mg by mouth at bedtime.)   [DISCONTINUED] simvastatin (ZOCOR) 40 MG tablet TAKE 1 TABLET BY MOUTH ONCE EVERY EVENING   [DISCONTINUED] SPIRIVA RESPIMAT 2.5 MCG/ACT AERS INHALE 2 PUFFS BY MOUTH ONCE DAILY FOR COPD   [DISCONTINUED] traMADol (ULTRAM) 50 MG tablet    No facility-administered encounter medications on file as of 02/03/2023.    Allergies (verified) Levofloxacin and Vicodin [hydrocodone-acetaminophen]   History: Past Medical History:  Diagnosis Date   Arthritis    Asthma    Crohn's disease ([place])    Emphysema of lung ([place])    GERD (gastroesophageal reflux disease)    Hip fracture ([place]) 01/26/2017   History of chicken pox    History of heart attack    Hx of completed stroke    Hypercholesteremia     Hypertension    Parkinson disease    Pneumonia    Seasonal allergies    Sepsis ([place])    Septic shock ([place]) 10/08/2016   UTI (lower urinary tract infection)    Past Surgical History:  Procedure Laterality Date   BREAST BIOPSY Bilateral 1960's to 90's   breast cysts     CATARACT EXTRACTION, BILATERAL     COLONOSCOPY WITH PROPOFOL N/A 06/01/2022   Procedure: COLONOSCOPY WITH PROPOFOL;  Surgeon: [name], MD;  Location: [facility] ENDOSCOPY;  Service: Gastroenterology;  Laterality: N/A;   ELBOW SURGERY     right   right wrist fracture     TUBAL LIGATION     WRIST SURGERY Left    Family History  Problem Relation Age of Onset   Cancer Sister        ovarian/uterine/breast   Breast cancer Sister 56  Cancer Sister        breast   Heart disease Sister    Breast cancer Sister 65   Heart disease Father    Cancer Father        liver   Pancreatic cancer Mother    Heart disease Brother    Heart attack Brother    Cancer Brother        liver   Heart disease Sister    Diabetes Maternal Grandmother    Social History   Socioeconomic History   Marital status: Widowed    Spouse name: Not on file   Number of children: 3   Years of education: Not on file   Highest education level: Some college, no degree  Occupational History   Occupation: retired  Tobacco Use   Smoking status: Former    Packs/day: 1.00    Years: 30.00    Additional pack years: 0.00    Total pack years: 30.00    Types: Cigarettes    Quit date: 08/01/2008    Years since quitting: 14.5   Smokeless tobacco: Never  Vaping Use   Vaping Use: Never used  Substance and Sexual Activity   Alcohol use: Not Currently    Alcohol/week: 1.0 standard drink of alcohol    Types: 1 Shots of liquor per week   Drug use: No   Sexual activity: Not on file  Other Topics Concern   Not on file  Social History Narrative   Not on file   Social Determinants of Health   Financial Resource Strain: Low Risk  (02/02/2023)   Overall  Financial Resource Strain (CARDIA)    Difficulty of Paying Living Expenses: Not very hard  Food Insecurity: No Food Insecurity (02/02/2023)   Hunger Vital Sign    Worried About Running Out of Food in the Last Year: Never true    Ran Out of Food in the Last Year: Never true  Transportation Needs: No Transportation Needs (02/02/2023)   PRAPARE - Hydrologist (Medical): No    Lack of Transportation (Non-Medical): No  Physical Activity: Insufficiently Active (02/02/2023)   Exercise Vital Sign    Days of Exercise per Week: 3 days    Minutes of Exercise per Session: 20 min  Stress: No Stress Concern Present (02/02/2023)   [place]    Feeling of Stress : Not at all  Social Connections: Unknown (02/02/2023)   Social Connection and Isolation Panel [NHANES]    Frequency of Communication with Friends and Family: More than three times a week    Frequency of Social Gatherings with Friends and Family: Once a week    Attends Religious Services: Not on Diplomatic Services operational officer of Clubs or Organizations: No    Attends Archivist Meetings: Never    Marital Status: Widowed    Tobacco Counseling Counseling given: Not Answered  Clinical Intake:  Pre-visit preparation completed: Yes  Pain : No/denies pain   BMI - recorded: 23.81 Nutritional Status: BMI of 19-24  Normal Nutritional Risks: None Diabetes: No  How often do you need to have someone help you when you read instructions, pamphlets, or other written materials from your doctor or pharmacy?: 2 - Rarely  Diabetic?no  Interpreter Needed?: No  Comments: lives with sister Information entered by :: B.Veda Arrellano,LPN   Activities of Daily Living    02/02/2023    1:43 PM 12/22/2022    2:40 PM  In your present state of health, do you have any difficulty performing the following activities:  Hearing? 1 1  Vision? 0 0  Difficulty concentrating  or making decisions? 0 0  Walking or climbing stairs? 1 1  Dressing or bathing? 0 0  Doing errands, shopping? 1 1  Preparing Food and eating ? Y   In the past six months, have you accidently leaked urine? N   Do you have problems with loss of bowel control? N   Managing your Medications? N   Managing your Finances? N   Housekeeping or managing your Housekeeping? Y     Patient Care Team: Virginia Crews, MD as PCP - General (Family Medicine) Corey Skains, MD as Consulting Physician (Cardiology) Ree Edman, MD as Consulting Physician (Dermatology) Allyne Gee, MD as Consulting Physician (Internal Medicine) Katy Apo, MD as Consulting Physician (Ophthalmology) Gabriel Carina Betsey Holiday, MD as Physician Assistant (Endocrinology) Vladimir Crofts, MD as Consulting Physician (Neurology) Rutherford Limerick, MD as Attending Physician (Orthopedic Surgery)  Indicate any recent Medical Services you may have received from other than [facility] providers in the past year (date may be approximate).     Assessment:   This is a routine wellness examination for [employer].  Hearing/Vision screen Hearing Screening - Comments:: Adequate hearing:has two hearing aides Vision Screening - Comments:: Adequate vision after cataract surgery;only readers Dr. Candelaria Celeste  Dietary issues and exercise activities discussed: Current Exercise Habits: The patient does not participate in regular exercise at present, Exercise limited by: neurologic condition(s);respiratory conditions(s);cardiac condition(s)   Goals Addressed             This Visit's Progress    Improve Ability to Self-Manage Chronic Conditions   On track    Current Barriers:  Chronic Disease Management support and education needs related to Hypertension, COPD, Parkinson's and CAD. (Recent falls resulting in injury to both upper extremities)   Case Manager Clinical Goal(s):  Over the next 90 days, patient will not  require unexpected hospital admission. Over the next 90 days, patient will take all medications as prescribed. Over the next 90 days, patient will notify provider if status changes and with worsening s/sx related to cardiac or respiratory complications. Over the next 90 days, patient will work with team to determine level of care concerns and possible need for additional assistance in the home. Over the next 90 days, patient will adhere to recommended safety measures to prevent additional injuries. Over the next 90 days, patient will attend all medical appointments as schedule.   Interventions:  Reviewed medications. Encouraged to take medications as prescribed. Family is currently assisting with medication preparation and administration due patient being unable to use her upper extremities. Denies concerns regarding prescription costs. Discussed worsening s/sx of complications related to cardiac and respiratory emergencies. Discussed indications for seeking immediate medical attention. Reports being unable to monitor BP or complete routine care d/t injuries to both upper extremities. . Reports productive cough with "clear to white" sputum. Denies shortness of breath or chest discomfort. Verbalized awareness of indications for notifying MD Reviewed safety measures and provider recommendations regarding mobility. Reports being primarily wheelchair bound since recent injuries. Reports previously using walker or assistive device. Currently has hard cast to left hand and brace to right elbow. Family members are currently assisting with ADLs. Agreeable to agencies being contacted to provide additional in-home assistance if she qualifies. Reviewed pending appointments. Encouraged to attend appointments as scheduled to prevent delays in care. Reports son, Remo Lipps is  currently providing transportation to appointments. Discussed plans for ongoing care management and follow up. Provided direct contact information  for care management team.  Patient Self Care Activities: (Recent falls resulting in injuries to both upper extremities) Unable to self administer medications as prescribed Unable to perform ADLs independently Unable to perform IADLs independently  Initial goal documentation        Depression Screen    02/03/2023    1:08 PM 12/22/2022    2:40 PM 10/26/2022    1:44 PM 04/14/2022   11:02 AM 01/29/2022    1:06 PM 12/15/2021    1:13 PM 01/23/2021    1:30 PM  PHQ 2/9 Scores  PHQ - 2 Score 0 0 0 0 1 2 1   PHQ- 9 Score 0 3 3  2 3      Fall Risk    02/02/2023    1:43 PM 12/22/2022    2:40 PM 10/26/2022    1:44 PM 04/14/2022   11:02 AM 01/29/2022    1:09 PM  [place] in the past year? 0 0 0 1 1  Number falls in past yr: 0 0 0 1 1  Injury with Fall? 0 0 0 1 1  Comment    right arm   Risk for fall due to : No Fall Risks Impaired balance/gait Impaired balance/gait  History of fall(s)  Follow up Education provided;Falls prevention discussed Falls evaluation completed;Education provided;Falls prevention discussed Falls evaluation completed  Falls prevention discussed    FALL RISK PREVENTION PERTAINING TO THE HOME:  Any stairs in or around the home? Yes pt has ramp at her house If so, are there any without handrails? Yes  Home free of loose throw rugs in walkways, pet beds, electrical cords, etc? Yes  Adequate lighting in your home to reduce risk of falls? Yes   ASSISTIVE DEVICES UTILIZED TO PREVENT FALLS:  Life alert? Yes  Use of a cane, walker or w/c? Yes walker Grab bars in the bathroom? Yes  Shower chair or bench in shower? Yes  Elevated toilet seat or a handicapped toilet? Yes   Cognitive Function:        02/03/2023    1:14 PM 01/22/2020    2:15 PM  6CIT Screen  What Year? 0 points 0 points  What month? 0 points 0 points  What time? 0 points 0 points  Count back from 20 0 points 0 points  Months in reverse 0 points 0 points  Repeat phrase 0 points 0 points  Total  Score 0 points 0 points    Immunizations Immunization History  Administered Date(s) Administered   Fluad Quad(high Dose 65+) 09/05/2019, 09/10/2021, 10/26/2022   Influenza Inj Mdck Quad Pf 07/23/2020   Influenza Split 10/28/2011, 08/10/2012   Influenza, High Dose Seasonal PF 08/21/2014, 08/29/2015   Influenza,inj,Quad PF,6+ Mos 08/16/2013   Influenza-Unspecified 08/16/2014, 08/11/2018, 09/10/2021   PFIZER(Purple Top)SARS-COV-2 Vaccination 02/01/2020, 02/27/2020   Pneumococcal Conjugate-13 01/10/2015   Pneumococcal Polysaccharide-23 05/29/2013   Tdap 04/01/2017   Zoster, Live 08/10/2012    TDAP status: Up to date  Flu Vaccine status: Up to date  Pneumococcal vaccine status: Up to date  Covid-19 vaccine status: Completed vaccines  Qualifies for Shingles Vaccine? Yes   Zostavax completed No   Shingrix Completed?: No.    Education has been provided regarding the importance of this vaccine. Patient has been advised to call insurance company to determine out of pocket expense if they have not yet received this vaccine. Advised  may also receive vaccine at local pharmacy or Health Dept. Verbalized acceptance and understanding.  Screening Tests Health Maintenance  Topic Date Due   Zoster Vaccines- Shingrix (1 of 2) Never done   Lung Cancer Screening  06/14/2015   COVID-19 Vaccine (3 - 2023-24 season) 07/17/2022   DEXA SCAN  01/15/2023   Medicare Annual Wellness (AWV)  02/03/2024   MAMMOGRAM  08/20/2024   DTaP/Tdap/Td (2 - Td or Tdap) 04/02/2027   COLONOSCOPY (Pts 45-17yrs Insurance coverage will need to be confirmed)  06/01/2032   Pneumonia Vaccine 67+ Years old  Completed   INFLUENZA VACCINE  Completed   Hepatitis C Screening  Completed   HPV VACCINES  Aged Out    Health Maintenance  Health Maintenance Due  Topic Date Due   Zoster Vaccines- Shingrix (1 of 2) Never done   Lung Cancer Screening  06/14/2015   COVID-19 Vaccine (3 - 2023-24 season) 07/17/2022   DEXA SCAN   01/15/2023    Colorectal cancer screening: No longer required.   Mammogram status: No longer required due to age.  Bone Density status: Completed yes. Results reflect: Bone density results: OSTEOPOROSIS. Repeat every 5 years.  Lung Cancer Screening: (Low Dose CT Chest recommended if Age 41-80 years, 30 pack-year currently smoking OR have quit w/in 15years.) does not qualify.   Lung Cancer Screening Referral: no  Additional Screening:  Hepatitis C Screening: does not qualify; Completed yes  Vision Screening: Recommended annual ophthalmology exams for early detection of glaucoma and other disorders of the eye. Is the patient up to date with their annual eye exam?  Yes  Who is the provider or what is the name of the office in which the patient attends annual eye exams? Dr Prudencio Burly If pt is not established with a provider, would they like to be referred to a provider to establish care? No .   Dental Screening: Recommended annual dental exams for proper oral hygiene  Community Resource Referral / Chronic Care Management: CRR required this visit?  No   CCM required this visit?  No      Plan:     I have personally reviewed and noted the following in the patient's chart:   Medical and social history Use of alcohol, tobacco or illicit drugs  Current medications and supplements including opioid prescriptions. Patient is not currently taking opioid prescriptions. Functional ability and status Nutritional status Physical activity Advanced directives List of other physicians Hospitalizations, surgeries, and ER visits in previous 12 months Vitals Screenings to include cognitive, depression, and falls Referrals and appointments  In addition, I have reviewed and discussed with patient certain preventive protocols, quality metrics, and best practice recommendations. A written personalized care plan for preventive services as well as general preventive health recommendations were provided  to patient.     Roger Shelter, LPN   [id number]   Nurse Notes: pt says she is doing good. Pt lives with her sister who helps take care of her and the home. She ambulates w/a walker and sometimes "scooter". She uses wheelchair when out to appts or errands. Pt has no concerns or questions at this time.

## 2023-02-10 ENCOUNTER — Other Ambulatory Visit: Payer: Self-pay | Admitting: Internal Medicine

## 2023-02-10 NOTE — Telephone Encounter (Signed)
Patient is no longer on this medicine please d/c

## 2023-03-01 ENCOUNTER — Other Ambulatory Visit: Payer: Self-pay

## 2023-03-02 ENCOUNTER — Telehealth: Payer: Self-pay

## 2023-03-02 NOTE — Progress Notes (Signed)
Care Coordination Pharmacy Assistant   Name: Lori Brady  MRN: [id number] DOB: [birth year]  Reason for Encounter: Medication Refill   The patient called to request a refill on her Trelegy and her Zyrtec as she is almost out of both of these medications.   I sent a message to [facility] regarding the above. Per Chasity with [facility] they do not have a prescription on file for Zyrtec but they can do OTC for 4.00, and her copayment for her Trelegy is $40.00. Per Chasity they will deliver to the patient this afternoon.  Patient advised of the above prices and is agreeable.  Medications: Outpatient Encounter Medications as of 03/02/2023  Medication Sig Note   albuterol (ACCUNEB) 0.63 MG/3ML nebulizer solution Take 3 mLs (0.63 mg total) by nebulization every 6 (six) hours as needed for wheezing.    albuterol (VENTOLIN HFA) 108 (90 Base) MCG/ACT inhaler Inhale 2 puffs into the lungs every 6 (six) hours as needed for wheezing or shortness of breath.    albuterol (VENTOLIN HFA) 108 (90 Base) MCG/ACT inhaler Inhale 2 puffs into the lungs every 6 (six) hours as needed for wheezing or shortness of breath.    alendronate (FOSAMAX) 70 MG tablet TAKE ONE TABLET BY MOUTH EACH WEEK, ON AN EMPTY STOMACH BEFORE BREAKFAST WITH 8oz OF WATER AND REMAIN UPRIGHT FOR :30    aspirin 81 MG EC tablet Take 81 mg by mouth daily with breakfast.    atorvastatin (LIPITOR) 40 MG tablet Take 1 tablet (40 mg total) by mouth at bedtime.    azelastine (ASTELIN) 0.1 % nasal spray Place 2 sprays into both nostrils daily at 6 (six) AM.    betamethasone valerate (VALISONE) 0.1 % cream APPLY TOPICALLY TO AFFECTED AREA SPARINGLY 2 TIMES DAILY. 01/23/2021: As needed   carbidopa-levodopa (SINEMET CR) 50-200 MG tablet Take 1 tablet by mouth at bedtime.    carbidopa-levodopa (SINEMET IR) 25-100 MG tablet Take 3 tablets with breakfast, 2 tablets with lunch, 2 tablets with supper.    cetirizine (ZYRTEC) 10 MG tablet TAKE 1 TABLET BY  MOUTH ONCE DAILY    Cholecalciferol (VITAMIN D) 2000 UNITS CAPS Take 1 capsule by mouth daily.    citalopram (CELEXA) 20 MG tablet Take 1 tablet (20 mg total) by mouth daily.    clobetasol (TEMOVATE) 0.05 % external solution Apply 1 application topically 2 (two) times daily. As needed    clobetasol cream (TEMOVATE) 0.05 % Apply 1 application topically 2 (two) times daily. As needed    doxepin (SINEQUAN) 25 MG capsule Take 1 capsule (25 mg total) by mouth at bedtime.    ferrous sulfate 325 (65 FE) MG EC tablet Take 650 mg by mouth daily with breakfast.    Fluticasone-Umeclidin-Vilant (TRELEGY ELLIPTA) 100-62.5-25 MCG/ACT AEPB Inhale 1 puff into the lungs daily.    ibuprofen (ADVIL) 200 MG tablet Take 200 mg by mouth every 6 (six) hours as needed.    ketoconazole (NIZORAL) 2 % shampoo Apply 1 application. topically 2 (two) times a week.    melatonin 5 MG TABS Take 1 tablet by mouth at bedtime.    montelukast (SINGULAIR) 10 MG tablet Take 1 tablet (10 mg total) by mouth every morning.    omeprazole (PRILOSEC) 40 MG capsule Take 1 capsule (40 mg total) by mouth at bedtime.    No facility-administered encounter medications on file as of 03/02/2023.    Adelene Idler, CPA/CMA Clinical Pharmacist Assistant Phone: [phone]

## 2023-03-04 ENCOUNTER — Encounter: Payer: Self-pay | Admitting: Gastroenterology

## 2023-03-04 ENCOUNTER — Ambulatory Visit: Payer: Medicare PPO | Admitting: Gastroenterology

## 2023-03-04 ENCOUNTER — Other Ambulatory Visit: Payer: Self-pay

## 2023-03-04 VITALS — BP 144/73 | HR 80 | Temp 98.3°F | Ht 61.0 in | Wt 125.4 lb

## 2023-03-04 DIAGNOSIS — D508 Other iron deficiency anemias: Secondary | ICD-10-CM

## 2023-03-04 DIAGNOSIS — Z791 Long term (current) use of non-steroidal anti-inflammatories (NSAID): Secondary | ICD-10-CM | POA: Diagnosis not present

## 2023-03-04 DIAGNOSIS — K219 Gastro-esophageal reflux disease without esophagitis: Secondary | ICD-10-CM

## 2023-03-04 DIAGNOSIS — R040 Epistaxis: Secondary | ICD-10-CM

## 2023-03-04 MED ORDER — NA SULFATE-K SULFATE-MG SULF 17.5-3.13-1.6 GM/177ML PO SOLN: 354.0000 mL | Freq: Once | ORAL | 0 refills | Status: AC

## 2023-03-04 MED ORDER — OMEPRAZOLE 40 MG PO CPDR
40.0000 mg | DELAYED_RELEASE_CAPSULE | Freq: Every day | ORAL | 0 refills | Status: DC
Start: 2023-03-04 — End: 2023-07-08

## 2023-03-04 NOTE — Addendum Note (Signed)
Addended by: Adela Ports on: 03/04/2023 02:32 PM   Modules accepted: Orders

## 2023-03-04 NOTE — Progress Notes (Signed)
Wyline Mood MD, MRCP(U.K) [address]  [address]  [place], [place] [address]  Main: [phone]  Fax: [phone]   Gastroenterology Consultation  Referring Provider:     Erasmo Downer, MD Primary Care Physician:  Erasmo Downer, MD Primary Gastroenterologist:  Dr. Wyline Mood  Reason for Consultation:     Anemia        HPI:   Lori Brady is a 77 y.o. y/o female referred for consultation & management  by Dr. Beryle Flock, Marzella Schlein, MD.     Seen Dr. Donneta Romberg for iron deficiency anemia.  Ferritin of 14 in December 2023.  On oral iron did not improve has had a discussion about IV iron.  In February 2024 hemoglobin 11.3 g up from 8.3 g.  Referred to see me for GI evaluation.  Urine analysis shows no blood.  Hemoglobin low for almost 3 to 4 years but acutely dropped 4 months back.  Iron 06/01/2022 colonoscopy normal colon.  She says that she has had recurrent nosebleeds and has seen Dr. Sheran Spine in ENT in the past for other issues but not for nosebleeds.  Denies any blood in the stools.  Denies any hematemesis denies any blood in her urine.  She does however say that she takes Motrin for 2-4 pills a day long-term at least for right arm pain.  Denies having any black stools presently her stools are green in color and she is on iron.  She is not on any acid suppressing medications at this point.  Past Medical History:  Diagnosis Date   Arthritis    Asthma    Crohn's disease    Emphysema of lung    GERD (gastroesophageal reflux disease)    Hip fracture 01/26/2017   History of chicken pox    History of heart attack    Hx of completed stroke    Hypercholesteremia    Hypertension    Parkinson disease    Pneumonia    Seasonal allergies    Sepsis    Septic shock 10/08/2016   UTI (lower urinary tract infection)     Past Surgical History:  Procedure Laterality Date   BREAST BIOPSY Bilateral 1960's to 90's   breast cysts     CATARACT EXTRACTION, BILATERAL      COLONOSCOPY WITH PROPOFOL N/A 06/01/2022   Procedure: COLONOSCOPY WITH PROPOFOL;  Surgeon: Wyline Mood, MD;  [facility] END[facility]COPY;  Service: Gastroenterology;  Laterality: N/A;   ELBOW SURGERY     right   right wrist fracture     TUBAL LIGATION     WRIST SURGERY Left     Prior to Admission medications   Medication Sig Start Date End Date Taking? Authorizing Provider  albuterol (ACCUNEB) 0.63 MG/3ML nebulizer solution Take 3 mLs (0.63 mg total) by nebulization every 6 (six) hours as needed for wheezing. 02/03/23   Yevonne Pax, MD  albuterol (VENTOLIN HFA) 108 (90 Base) MCG/ACT inhaler Inhale 2 puffs into the lungs every 6 (six) hours as needed for wheezing or shortness of breath. 02/03/23   Yevonne Pax, MD  albuterol (VENTOLIN HFA) 108 (90 Base) MCG/ACT inhaler Inhale 2 puffs into the lungs every 6 (six) hours as needed for wheezing or shortness of breath. 02/03/23   Bacigalupo, Marzella Schlein, MD  alendronate (FOSAMAX) 70 MG tablet TAKE ONE TABLET BY MOUTH EACH WEEK, ON AN EMPTY STOMACH BEFORE BREAKFAST WITH 8oz OF WATER AND REMAIN UPRIGHT FOR :30 01/04/23   Bacigalupo, Marzella Schlein,  MD  aspirin 81 MG EC tablet Take 81 mg by mouth daily with breakfast. 11/12/21   [provider]  atorvastatin (LIPITOR) 40 MG tablet Take 1 tablet (40 mg total) by mouth at bedtime. 12/28/22   Erasmo Downer, MD  azelastine (ASTELIN) 0.1 % nasal spray Place 2 sprays into both nostrils daily at 6 (six) AM. 09/05/20   [provider]  betamethasone valerate (VALISONE) 0.1 % cream APPLY TOPICALLY TO AFFECTED AREA SPARINGLY 2 TIMES DAILY. 07/10/15   [name], MD  carbidopa-levodopa (SINEMET CR) 50-200 MG tablet Take 1 tablet by mouth at bedtime. 01/13/22   [provider]  carbidopa-levodopa (SINEMET IR) 25-100 MG tablet Take 3 tablets with breakfast, 2 tablets with lunch, 2 tablets with supper. 01/13/22   [provider]  cetirizine (ZYRTEC) 10 MG tablet TAKE 1 TABLET BY MOUTH  ONCE DAILY 01/13/22   Yevonne Pax, MD  Cholecalciferol (VITAMIN D) 2000 UNITS CAPS Take 1 capsule by mouth daily.    [provider]  citalopram (CELEXA) 20 MG tablet Take 1 tablet (20 mg total) by mouth daily. 12/28/22   Erasmo Downer, MD  clobetasol (TEMOVATE) 0.05 % external solution Apply 1 application topically 2 (two) times daily. As needed    [provider]  clobetasol cream (TEMOVATE) 0.05 % Apply 1 application topically 2 (two) times daily. As needed    [provider]  doxepin (SINEQUAN) 25 MG capsule Take 1 capsule (25 mg total) by mouth at bedtime. 12/28/22   Erasmo Downer, MD  ferrous sulfate 325 (65 FE) MG EC tablet Take 650 mg by mouth daily with breakfast.    [provider]  Fluticasone-Umeclidin-Vilant (TRELEGY ELLIPTA) 100-62.5-25 MCG/ACT AEPB Inhale 1 puff into the lungs daily. 02/03/23   Erasmo Downer, MD  ibuprofen (ADVIL) 200 MG tablet Take 200 mg by mouth every 6 (six) hours as needed.    [provider]  ketoconazole (NIZORAL) 2 % shampoo Apply 1 application. topically 2 (two) times a week. 09/04/19   [provider]  melatonin 5 MG TABS Take 1 tablet by mouth at bedtime.    [provider]  montelukast (SINGULAIR) 10 MG tablet Take 1 tablet (10 mg total) by mouth every morning. 12/28/22   Erasmo Downer, MD  omeprazole (PRILOSEC) 40 MG capsule Take 1 capsule (40 mg total) by mouth at bedtime. 12/28/22   Bacigalupo, Marzella Schlein, MD    Family History  Problem Relation Age of Onset   Cancer Sister        ovarian/uterine/breast   Breast cancer Sister 34   Cancer Sister        breast   Heart disease Sister    Breast cancer Sister 61   Heart disease Father    Cancer Father        liver   Pancreatic cancer Mother    Heart disease Brother    Heart attack Brother    Cancer Brother        liver   Heart disease Sister    Diabetes Maternal Grandmother      Social History   Tobacco  Use   Smoking status: Former    Packs/day: 1.00    Years: 30.00    Additional pack years: 0.00    Total pack years: 30.00    Types: Cigarettes    Quit date: 08/01/2008    Years since quitting: 14.5   Smokeless tobacco: Never  Vaping Use   Vaping  Use: Never used  Substance Use Topics   Alcohol use: Not Currently    Alcohol/week: 1.0 standard drink of alcohol    Types: 1 Shots of liquor per week   Drug use: No    Allergies as of 03/04/2023 - Review Complete 03/04/2023  Allergen Reaction Noted   Levofloxacin Nausea Only and Nausea And Vomiting 05/03/2015   Vicodin [hydrocodone-acetaminophen] Nausea Only 08/01/2014    Review of Systems:    All systems reviewed and negative except where noted in HPI.   Physical Exam:  BP (!) 155/73   Pulse 80   Temp 98.3 F (36.8 C) (Oral)   Ht 5\' 1"  (1.549 m)   Wt 125 lb 6.4 oz (56.9 kg)   BMI 23.69 kg/m  No LMP recorded. Patient is postmenopausal. Psych:  Alert and cooperative. Normal mood and affect. General:   Alert,  Well-developed, well-nourished, pleasant and cooperative in NAD Head:  Normocephalic and atraumatic. Eyes:  Sclera clear, no icterus.   Conjunctiva pink. Ears:  Normal auditory acuity. Abdomen:  Normal bowel sounds.  No bruits.  Soft, non-tender and non-distended without masses, hepatosplenomegaly or hernias noted.  No guarding or rebound tenderness.    Neurologic:  Alert and oriented x3;  grossly normal neurologically. Psych:  Alert and cooperative. Normal mood and affect.  Imaging Studies: No results found.  Assessment and Plan:   Lori Brady is a 77 y.o. y/o female has been referred for deficiency anemia.  Colonoscopy in July 2023 showed no abnormalities.  Now here to see me for new onset iron deficiency anemia.  History of nosebleeds, long-term use of Motrin 2 to 4 pills a day at least for a year if not longer for arm pain likely the underlying cause of iron deficiency anemia.  Plan 1.  EGD, colonoscopy and if  negative will require capsule study of the small bowel.  Celiac serology H. pylori breath test B12 folate. 2.  Stop long-term NSAID use commenced on Prilosec 40 mg once a day for 8 weeks then stop to treat any gastritis or ulcers 3.  Refer to ENT for recurrent nosebleeds   I have discussed alternative options, risks & benefits,  which include, but are not limited to, bleeding, infection, perforation,respiratory complication & drug reaction.  The patient agrees with this plan & written consent will be obtained.      Follow up in 6-8 weeks with APP  Dr Wyline Mood MD,MRCP(U.K)

## 2023-03-05 LAB — B12 AND FOLATE PANEL: Folate: 10.2 ng/mL (ref >3.0)

## 2023-03-06 LAB — CELIAC DISEASE AB SCREEN W/RFX: Antigliadin Abs, IgA: 5 units (ref 0–19)

## 2023-03-06 LAB — H. PYLORI BREATH TEST: H pylori Breath Test: NEGATIVE

## 2023-03-06 LAB — B12 AND FOLATE PANEL: Vitamin B-12: 558 pg/mL (ref 232–1245)

## 2023-03-07 LAB — CELIAC DISEASE AB SCREEN W/RFX
IgA/Immunoglobulin A, Serum: 324 mg/dL (ref 64–422)
Transglutaminase IgA: <2 U/mL (ref 0–3)

## 2023-03-08 ENCOUNTER — Other Ambulatory Visit: Payer: Self-pay

## 2023-03-08 DIAGNOSIS — R0602 Shortness of breath: Secondary | ICD-10-CM

## 2023-03-08 NOTE — Addendum Note (Signed)
Addended by: Adela Ports on: 03/08/2023 09:13 AM   Modules accepted: Orders

## 2023-03-10 ENCOUNTER — Telehealth: Payer: Self-pay | Admitting: Internal Medicine

## 2023-03-10 NOTE — Telephone Encounter (Signed)
Left vm to confirm 03/17/2023 appointment-Toni

## 2023-03-12 ENCOUNTER — Telehealth: Payer: Self-pay

## 2023-03-12 ENCOUNTER — Telehealth: Payer: Self-pay | Admitting: Internal Medicine

## 2023-03-12 DIAGNOSIS — M810 Age-related osteoporosis without current pathological fracture: Secondary | ICD-10-CM

## 2023-03-12 NOTE — Telephone Encounter (Signed)
Left another vm for patient to confirm 05/01/2 appointment. Lvm with son as well-Lori Brady

## 2023-03-15 NOTE — Progress Notes (Unsigned)
Care Management & Coordination Services Pharmacy Team Pharmacy Assistant   Name: Lori Brady  MRN: [id number] DOB: 29-Sep-1946  Reason for Encounter: Medication Coordination and Delivery for [facility]  Contacted patient to discuss medications and coordinate delivery from [facility]. Unsuccessful outreach. Left voicemail for patient to return call. 04/29 @ 1050 {US HC Outreach:[id number]}  Chart review: Recent office visits:  02/03/2023 Albertine Grates, LPN (PCP Clinical Support Visit) for Medicare Wellness Exam- No medication changes noted, No orders placed,   Recent consult visits:  04/18/02024 Wyline Mood, MD (Gastroenterology) for IDA- Started: Na Sulfate-K Sulfate-Mg Sulf 17.5-3.13-1.6 gm  354 mLs Oral  Once, Starting at 5 PM take one bottle and pour into the supplied cup, add cool water to the fill 16 oz line and drink all. Then 5 hours before procedure pour the second bottle into the supplied cup, add cool water to the fill 16 oz line and drink all., Lab orders placed, Referral to ENT placed, BP: 144/73, Patient to follow-up in 6-8 weeks  Hospital visits:  None in previous 6 months  Medications: Outpatient Encounter Medications as of 03/12/2023  Medication Sig Note   albuterol (ACCUNEB) 0.63 MG/3ML nebulizer solution Take 3 mLs (0.63 mg total) by nebulization every 6 (six) hours as needed for wheezing.    albuterol (VENTOLIN HFA) 108 (90 Base) MCG/ACT inhaler Inhale 2 puffs into the lungs every 6 (six) hours as needed for wheezing or shortness of breath.    albuterol (VENTOLIN HFA) 108 (90 Base) MCG/ACT inhaler Inhale 2 puffs into the lungs every 6 (six) hours as needed for wheezing or shortness of breath.    alendronate (FOSAMAX) 70 MG tablet TAKE ONE TABLET BY MOUTH EACH WEEK, ON AN EMPTY STOMACH BEFORE BREAKFAST WITH 8oz OF WATER AND REMAIN UPRIGHT FOR :30    aspirin 81 MG EC tablet Take 81 mg by mouth daily with breakfast.    atorvastatin (LIPITOR) 40 MG tablet Take 1  tablet (40 mg total) by mouth at bedtime.    azelastine (ASTELIN) 0.1 % nasal spray Place 2 sprays into both nostrils daily at 6 (six) AM.    betamethasone valerate (VALISONE) 0.1 % cream APPLY TOPICALLY TO AFFECTED AREA SPARINGLY 2 TIMES DAILY. 01/23/2021: As needed   carbidopa-levodopa (SINEMET CR) 50-200 MG tablet Take 1 tablet by mouth at bedtime.    carbidopa-levodopa (SINEMET IR) 25-100 MG tablet Take 3 tablets with breakfast, 2 tablets with lunch, 2 tablets with supper.    cetirizine (ZYRTEC) 10 MG tablet TAKE 1 TABLET BY MOUTH ONCE DAILY    Cholecalciferol (VITAMIN D) 2000 UNITS CAPS Take 1 capsule by mouth daily.    citalopram (CELEXA) 20 MG tablet Take 1 tablet (20 mg total) by mouth daily.    clobetasol (TEMOVATE) 0.05 % external solution Apply 1 application topically 2 (two) times daily. As needed    clobetasol cream (TEMOVATE) 0.05 % Apply 1 application topically 2 (two) times daily. As needed    doxepin (SINEQUAN) 25 MG capsule Take 1 capsule (25 mg total) by mouth at bedtime.    ferrous sulfate 325 (65 FE) MG EC tablet Take 650 mg by mouth daily with breakfast.    Fluticasone-Umeclidin-Vilant (TRELEGY ELLIPTA) 100-62.5-25 MCG/ACT AEPB Inhale 1 puff into the lungs daily.    ibuprofen (ADVIL) 200 MG tablet Take 200 mg by mouth every 6 (six) hours as needed.    ketoconazole (NIZORAL) 2 % shampoo Apply 1 application. topically 2 (two) times a week.    melatonin 5 MG  TABS Take 1 tablet by mouth at bedtime.    montelukast (SINGULAIR) 10 MG tablet Take 1 tablet (10 mg total) by mouth every morning.    omeprazole (PRILOSEC) 40 MG capsule Take 1 capsule (40 mg total) by mouth at bedtime.    No facility-administered encounter medications on file as of 03/12/2023.   BP Readings from Last 3 Encounters:  03/04/23 (!) 144/73  12/22/22 133/81  12/21/22 104/75    Pulse Readings from Last 3 Encounters:  03/04/23 80  12/22/22 80  12/21/22 81    Lab Results  Component Value Date/Time    HGBA1C 5.8 (H) 10/26/2022 01:39 PM   HGBA1C 5.3 01/26/2017 12:08 AM   Lab Results  Component Value Date   CREATININE 0.74 12/21/2022   BUN 16 12/21/2022   GFRNONAA >60 12/21/2022   GFRAA 83 07/25/2020   NA 138 12/21/2022   K 3.9 12/21/2022   CALCIUM 9.1 12/21/2022   CO2 26 12/21/2022   Cycle dispensing form sent to Leil[facility] Able, [facility] for review.   Last adherence delivery date: This is my first med sync for this patient      Patient is due for next adherence delivery on:  03/25/2023 [facility]  This delivery to include: Adherence Packaging  30 Days  Alendronate 70 mg 1 tablet daily (Breakfast) Aspirin 81 mg 1 tablet daily (Lunch) Atorvastatin 40 mg 1 tablet daily (Bedtime) Carbidopa-Levodopa 50-200 mg 1 tablet daily (Bedtime) Carbidopa-Levodopa 25-100 mg 7 tablets daily (3 Breakfast, 2 Lunch, 2 Evening Meal) Zyrtec 10 mg 1 tablet daily (Bedtime) Citalopram 20 mg 1 tablet daily (Breakfast) Doxepin 25 mg 1 tablet daily (Bedtime) Singular 10 mg 1 tablet daily (Breakfast) Omeprazole 40 mg 1 tablet daily (Bedtime) Prevident Mouth Wash Albuterol Inhaler 2 puffs q6h prn  Patient declined the following medications this month: No medications were declined  No refill request needed.  Confirmed delivery date of 03/25/2023 [address], advised patient that pharmacy will contact them the morning of delivery.  Any concerns about your medications? No  How often do you forget or accidentally miss a dose? Never  Do you use a pillbox? No  Is patient in packaging Yes  If yes  What is the date on your next pill pack?  Any concerns or issues with your packaging?  Patient has a follow-up with CPP on 03/29/2023 @ 1100.  Adelene Idler, CPA/CMA Clinical Pharmacist Assistant Phone: [phone]

## 2023-03-17 ENCOUNTER — Ambulatory Visit: Payer: Medicare PPO | Admitting: Internal Medicine

## 2023-03-19 MED FILL — Iron Sucrose Inj 20 MG/ML (Fe Equiv): INTRAVENOUS | Qty: 10 | Status: AC

## 2023-03-22 ENCOUNTER — Inpatient Hospital Stay: Payer: Medicare PPO

## 2023-03-22 ENCOUNTER — Encounter: Payer: Self-pay | Admitting: Internal Medicine

## 2023-03-22 ENCOUNTER — Inpatient Hospital Stay: Payer: Medicare PPO | Admitting: Internal Medicine

## 2023-03-22 ENCOUNTER — Inpatient Hospital Stay: Payer: Medicare PPO | Attending: Internal Medicine

## 2023-03-22 VITALS — BP 154/70 | HR 81 | Temp 98.7°F | Ht 61.0 in | Wt 127.2 lb

## 2023-03-22 DIAGNOSIS — G20A1 Parkinson's disease without dyskinesia, without mention of fluctuations: Secondary | ICD-10-CM | POA: Diagnosis not present

## 2023-03-22 DIAGNOSIS — D649 Anemia, unspecified: Secondary | ICD-10-CM | POA: Diagnosis not present

## 2023-03-22 DIAGNOSIS — D509 Iron deficiency anemia, unspecified: Secondary | ICD-10-CM | POA: Insufficient documentation

## 2023-03-22 DIAGNOSIS — Z79899 Other long term (current) drug therapy: Secondary | ICD-10-CM | POA: Diagnosis not present

## 2023-03-22 DIAGNOSIS — Z8673 Personal history of transient ischemic attack (TIA), and cerebral infarction without residual deficits: Secondary | ICD-10-CM | POA: Insufficient documentation

## 2023-03-22 LAB — CBC WITH DIFFERENTIAL/PLATELET
Abs Immature Granulocytes: 0.17 10*3/uL — ABNORMAL HIGH (ref 0.00–0.07)
Basophils Absolute: 0 10*3/uL (ref 0.0–0.1)
Basophils Relative: 0 %
Eosinophils Absolute: 0.2 10*3/uL (ref 0.0–0.5)
Eosinophils Relative: 2 %
HCT: 37.9 % (ref 36.0–46.0)
Hemoglobin: 12 g/dL (ref 12.0–15.0)
Immature Granulocytes: 2 %
Lymphocytes Relative: 20 %
Lymphs Abs: 2.2 10*3/uL (ref 0.7–4.0)
MCH: 29.7 pg (ref 26.0–34.0)
MCHC: 31.7 g/dL (ref 30.0–36.0)
MCV: 93.8 fL (ref 80.0–100.0)
Monocytes Absolute: 0.6 10*3/uL (ref 0.1–1.0)
Monocytes Relative: 5 %
Neutro Abs: 7.8 10*3/uL — ABNORMAL HIGH (ref 1.7–7.7)
Neutrophils Relative %: 71 %
Platelets: 175 10*3/uL (ref 150–400)
RBC: 4.04 MIL/uL (ref 3.87–5.11)
RDW: 13.4 % (ref 11.5–15.5)
WBC: 11 10*3/uL — ABNORMAL HIGH (ref 4.0–10.5)
nRBC: 0 % (ref 0.0–0.2)

## 2023-03-22 LAB — COMPREHENSIVE METABOLIC PANEL
ALT: 7 U/L (ref 0–44)
AST: 30 U/L (ref 15–41)
Albumin: 4.1 g/dL (ref 3.5–5.0)
Alkaline Phosphatase: 127 U/L — ABNORMAL HIGH (ref 38–126)
Anion gap: 11 (ref 5–15)
BUN: 17 mg/dL (ref 8–23)
CO2: 26 mmol/L (ref 22–32)
Calcium: 9 mg/dL (ref 8.9–10.3)
Chloride: 102 mmol/L (ref 98–111)
Creatinine, Ser: 0.52 mg/dL (ref 0.44–1.00)
GFR, Estimated: >60 mL/min (ref >60)
Glucose, Bld: 105 mg/dL — ABNORMAL HIGH (ref 70–99)
Potassium: 3.7 mmol/L (ref 3.5–5.1)
Sodium: 139 mmol/L (ref 135–145)
Total Bilirubin: 0.9 mg/dL (ref 0.3–1.2)
Total Protein: 7.5 g/dL (ref 6.5–8.1)

## 2023-03-22 LAB — IRON AND TIBC
Iron: 58 ug/dL (ref 28–170)
Saturation Ratios: 17 % (ref 10.4–31.8)
TIBC: 342 ug/dL (ref 250–450)
UIBC: 284 ug/dL

## 2023-03-22 LAB — FERRITIN: Ferritin: 69 ng/mL (ref 11–307)

## 2023-03-22 NOTE — Progress Notes (Signed)
[facility] CONSULT NOTE  Patient Care Team: Beryle Flock Marzella Schlein, MD as PCP - General (Family Medicine) Lamar Blinks, MD as Consulting Physician (Cardiology) Jesusita Oka, MD as Consulting Physician (Dermatology) Yevonne Pax, MD as Consulting Physician (Internal Medicine) Antony Contras, MD as Consulting Physician (Ophthalmology) Tedd Sias, Marlana Salvage, MD as Physician Assistant (Endocrinology) Lonell Face, MD as Consulting Physician (Neurology) Fredderick Erb, MD as Attending Physician (Orthopedic Surgery)  CHIEF COMPLAINTS/PURPOSE OF CONSULTATION: ANEMIA   HEMATOLOGY HISTORY  # ANEMIA[Hb; MCV-platelets- WBC; Iron sat; ferritin;  GFR- CT/US; EGD- none; /colonoscopy-2022- Dr.Anna [GI]   Latest Reference Range & Units 10/26/22 13:39  Iron 27 - 139 ug/dL 15 (L)  UIBC 409 - 811 ug/dL 914 (H)  TIBC 782 - 956 ug/dL 213  Ferritin 15 - 086 ng/mL 14 (L)  Iron Saturation 15 - 55 % 4 (LL)  (LL): Data is critically low (L): Data is abnormally low (H): Data is abnormally high  HISTORY OF PRESENTING ILLNESS: in a wheel chair; with her sister in law.   Lori Brady 77 y.o.  female pleasant patient iron deficient anemia-of unclear etiology is here for follow-up.   In the interim patient has been evaluated by GI.  Currently awaiting endoscopies.  Patient is on oral iron pills.  Denies any constipation or diarrhea.  No blood in stools or black or stools.  Notes slight improvement of energy levels. Review of Systems  Constitutional:  Negative for chills, diaphoresis, fever, malaise/fatigue and weight loss.  HENT:  Negative for nosebleeds and sore throat.   Eyes:  Negative for double vision.  Respiratory:  Negative for cough, hemoptysis, sputum production, shortness of breath and wheezing.   Cardiovascular:  Negative for chest pain, palpitations, orthopnea and leg swelling.  Gastrointestinal:  Negative for abdominal pain, blood in stool, constipation,  diarrhea, heartburn, melena, nausea and vomiting.  Genitourinary:  Negative for dysuria, frequency and urgency.  Musculoskeletal:  Negative for back pain and joint pain.  Skin: Negative.  Negative for itching and rash.  Neurological:  Negative for dizziness, tingling, focal weakness, weakness and headaches.  Endo/Heme/Allergies:  Does not bruise/bleed easily.  Psychiatric/Behavioral:  Negative for depression. The patient is not nervous/anxious and does not have insomnia.      MEDICAL HISTORY:  Past Medical History:  Diagnosis Date   Arthritis    Asthma    Crohn's disease (HCC)    Emphysema of lung (HCC)    GERD (gastroesophageal reflux disease)    Hip fracture (HCC) 01/26/2017   History of chicken pox    History of heart attack    Hx of completed stroke    Hypercholesteremia    Hypertension    Parkinson disease    Pneumonia    Seasonal allergies    Sepsis (HCC)    Septic shock (HCC) 10/08/2016   UTI (lower urinary tract infection)     SURGICAL HISTORY: Past Surgical History:  Procedure Laterality Date   BREAST BIOPSY Bilateral 1960's to 90's   breast cysts     CATARACT EXTRACTION, BILATERAL     COLONOSCOPY WITH PROPOFOL N/A 06/01/2022   Procedure: COLONOSCOPY WITH PROPOFOL;  Surgeon: Wyline Mood, MD;  [facility] END[facility]COPY;  Service: Gastroenterology;  Laterality: N/A;   ELBOW SURGERY     right   right wrist fracture     TUBAL LIGATION     WRIST SURGERY Left     SOCIAL HISTORY: Social History   Socioeconomic History   Marital status: Widowed  Spouse name: Not on file   Number of children: 3   Years of education: Not on file   Highest education level: Some college, no degree  Occupational History   Occupation: retired  Tobacco Use   Smoking status: Former    Packs/day: 1.00    Years: 30.00    Additional pack years: 0.00    Total pack years: 30.00    Types: Cigarettes    Quit date: 08/01/2008    Years since quitting: 14.6   Smokeless tobacco: Never   Vaping Use   Vaping Use: Never used  Substance and Sexual Activity   Alcohol use: Not Currently    Alcohol/week: 1.0 standard drink of alcohol    Types: 1 Shots of liquor per week   Drug use: No   Sexual activity: Not on file  Other Topics Concern   Not on file  Social History Narrative   Not on file   Social Determinants of Health   Financial Resource Strain: Low Risk  (02/02/2023)   Overall Financial Resource Strain (CARDIA)    Difficulty of Paying Living Expenses: Not very hard  Food Insecurity: No Food Insecurity (02/02/2023)   Hunger Vital Sign    Worried About Running Out of Food in the Last Year: Never true    Ran Out of Food in the Last Year: Never true  Transportation Needs: No Transportation Needs (02/02/2023)   PRAPARE - Administrator, Civil Service (Medical): No    Lack of Transportation (Non-Medical): No  Physical Activity: Insufficiently Active (02/02/2023)   Exercise Vital Sign    Days of Exercise per Week: 3 days    Minutes of Exercise per Session: 20 min  Stress: No Stress Concern Present (02/02/2023)   [employer] of Occupational Health - Occupational Stress Questionnaire    Feeling of Stress : Not at all  Social Connections: Unknown (02/02/2023)   Social Connection and Isolation Panel [NHANES]    Frequency of Communication with Friends and Family: More than three times a week    Frequency of Social Gatherings with Friends and Family: Once a week    Attends Religious Services: Not on Insurance claims handler of Clubs or Organizations: No    Attends Banker Meetings: Never    Marital Status: Widowed  Intimate Partner Violence: Not At Risk (02/03/2023)   Humiliation, Afraid, Rape, and Kick questionnaire    Fear of Current or Ex-Partner: No    Emotionally Abused: No    Physically Abused: No    Sexually Abused: No    FAMILY HISTORY: Family History  Problem Relation Age of Onset   Cancer Sister        ovarian/uterine/breast    Breast cancer Sister 67   Cancer Sister        breast   Heart disease Sister    Breast cancer Sister 63   Heart disease Father    Cancer Father        liver   Pancreatic cancer Mother    Heart disease Brother    Heart attack Brother    Cancer Brother        liver   Heart disease Sister    Diabetes Maternal Grandmother     ALLERGIES:  is allergic to levofloxacin and vicodin [hydrocodone-acetaminophen].  MEDICATIONS:  Current Outpatient Medications  Medication Sig Dispense Refill   albuterol (ACCUNEB) 0.63 MG/3ML nebulizer solution Take 3 mLs (0.63 mg total) by nebulization every 6 (six) hours as  needed for wheezing. 360 mL 1   albuterol (VENTOLIN HFA) 108 (90 Base) MCG/ACT inhaler Inhale 2 puffs into the lungs every 6 (six) hours as needed for wheezing or shortness of breath. 18 g 1   albuterol (VENTOLIN HFA) 108 (90 Base) MCG/ACT inhaler Inhale 2 puffs into the lungs every 6 (six) hours as needed for wheezing or shortness of breath. 8.5 g 3   alendronate (FOSAMAX) 70 MG tablet TAKE ONE TABLET BY MOUTH EACH WEEK, ON AN EMPTY STOMACH BEFORE BREAKFAST WITH 8oz OF WATER AND REMAIN UPRIGHT FOR :30 12 tablet 0   aspirin 81 MG EC tablet Take 81 mg by mouth daily with breakfast.     atorvastatin (LIPITOR) 40 MG tablet Take 1 tablet (40 mg total) by mouth at bedtime. 90 tablet 0   azelastine (ASTELIN) 0.1 % nasal spray Place 2 sprays into both nostrils daily at 6 (six) AM.     betamethasone valerate (VALISONE) 0.1 % cream APPLY TOPICALLY TO AFFECTED AREA SPARINGLY 2 TIMES DAILY. 15 g 5   carbidopa-levodopa (SINEMET CR) 50-200 MG tablet Take 1 tablet by mouth at bedtime.     carbidopa-levodopa (SINEMET IR) 25-100 MG tablet Take 3 tablets with breakfast, 2 tablets with lunch, 2 tablets with supper.     cetirizine (ZYRTEC) 10 MG tablet TAKE 1 TABLET BY MOUTH ONCE DAILY 30 tablet 1   Cholecalciferol (VITAMIN D) 2000 UNITS CAPS Take 1 capsule by mouth daily.     citalopram (CELEXA) 20 MG tablet  Take 1 tablet (20 mg total) by mouth daily. 90 tablet 1   clobetasol (TEMOVATE) 0.05 % external solution Apply 1 application topically 2 (two) times daily. As needed     clobetasol cream (TEMOVATE) 0.05 % Apply 1 application topically 2 (two) times daily. As needed     doxepin (SINEQUAN) 25 MG capsule Take 1 capsule (25 mg total) by mouth at bedtime. 90 capsule 1   ferrous sulfate 325 (65 FE) MG EC tablet Take 650 mg by mouth daily with breakfast.     Fluticasone-Umeclidin-Vilant (TRELEGY ELLIPTA) 100-62.5-25 MCG/ACT AEPB Inhale 1 puff into the lungs daily. 30 each 11   ibuprofen (ADVIL) 200 MG tablet Take 200 mg by mouth every 6 (six) hours as needed.     ketoconazole (NIZORAL) 2 % shampoo Apply 1 application. topically 2 (two) times a week.     melatonin 5 MG TABS Take 1 tablet by mouth at bedtime.     montelukast (SINGULAIR) 10 MG tablet Take 1 tablet (10 mg total) by mouth every morning. 90 tablet 1   omeprazole (PRILOSEC) 40 MG capsule Take 1 capsule (40 mg total) by mouth at bedtime. 60 capsule 0   No current facility-administered medications for this visit.     Marland Kitchen  PHYSICAL EXAMINATION:   Vitals:   03/22/23 1400  BP: (!) 154/70  Pulse: 81  Temp: 98.7 F (37.1 C)  SpO2: 96%   Filed Weights   03/22/23 1400  Weight: 127 lb 3.2 oz (57.7 kg)    Physical Exam Vitals and nursing note reviewed.  HENT:     Head: Normocephalic and atraumatic.     Mouth/Throat:     Pharynx: Oropharynx is clear.  Eyes:     Extraocular Movements: Extraocular movements intact.     Pupils: Pupils are equal, round, and reactive to light.  Cardiovascular:     Rate and Rhythm: Normal rate and regular rhythm.  Pulmonary:     Comments: Decreased breath sounds bilaterally.  Abdominal:     Palpations: Abdomen is soft.  Musculoskeletal:        General: Normal range of motion.     Cervical back: Normal range of motion.  Skin:    General: Skin is warm.  Neurological:     General: No focal deficit  present.     Mental Status: She is alert and oriented to person, place, and time.  Psychiatric:        Behavior: Behavior normal.        Judgment: Judgment normal.      LABORATORY DATA:  I have reviewed the data as listed Lab Results  Component Value Date   WBC 11.0 (H) 03/22/2023   HGB 12.0 03/22/2023   HCT 37.9 03/22/2023   MCV 93.8 03/22/2023   PLT 175 03/22/2023   Recent Labs    10/26/22 1339 12/21/22 1241 03/22/23 1355  NA 142 138 139  K 4.4 3.9 3.7  CL 102 102 102  CO2 24 26 26   GLUCOSE 93 94 105*  BUN 11 16 17   CREATININE 0.72 0.74 0.52  CALCIUM 9.2 9.1 9.0  GFRNONAA  --  >60 >60  PROT 6.7  --  7.5  ALBUMIN 4.2  --  4.1  AST 19  --  30  ALT 9  --  7  ALKPHOS 101  --  127*  BILITOT 0.5  --  0.9     No results found.  ASSESSMENT & PLAN:   Symptomatic anemia #Iron deficient anemia:[iron sat- 4%; LOW ferritin; Hb 8.3- DEC 2023-PCP.  Currently on  PO iron BID. No constipation.  # MAY 2024- Hb 12 improved- continue PO iron BID. HOLD venofer.   #Etiology of iron deficiency: Unclear; prior GI evaluation-/ Dr.Anna- colonoscopy [[id number] WNL]; awaiting evaluation with Dr.Anna on June 3rd, 2024-EGD/capsule study. UA neg for blood in dec 2023.   # History of Parkinson disease/COPD-[stroke 2007-vocal cord paralysis]stable.  # DISPOSITION: # NO venofer today # follow up in 6 months- MD; labs- cbc/bmp; iron studies/ferritin- possible venofer Dr.B  All questions were answered. The patient knows to call the clinic with any problems, questions or concerns.    Earna Coder, MD 03/22/2023 2:39 PM

## 2023-03-22 NOTE — Assessment & Plan Note (Addendum)
#  Iron deficient anemia:[iron sat- 4%; LOW ferritin; Hb 8.3- DEC 2023-PCP.  Currently on  PO iron BID. No constipation.  # MAY 2024- Hb 12 improved- continue PO iron BID. HOLD venofer.   #Etiology of iron deficiency: Unclear; prior GI evaluation-/ Dr.Anna- colonoscopy [[id number] WNL]; awaiting evaluation with Dr.Anna on June 3rd, 2024-EGD/capsule study. UA neg for blood in dec 2023.   # History of Parkinson disease/COPD-[stroke 2007-vocal cord paralysis]stable.  # DISPOSITION: # NO venofer today # follow up in 6 months- MD; labs- cbc/bmp; iron studies/ferritin- possible venofer Dr.B

## 2023-03-22 NOTE — Progress Notes (Signed)
Fatigue/weakness: no Dyspena: no Light headedness: no Blood in stool:  no 

## 2023-03-23 ENCOUNTER — Other Ambulatory Visit: Payer: Self-pay | Admitting: Family Medicine

## 2023-03-23 DIAGNOSIS — E7800 Pure hypercholesterolemia, unspecified: Secondary | ICD-10-CM

## 2023-03-23 NOTE — Telephone Encounter (Signed)
Requested Prescriptions  Pending Prescriptions Disp Refills   atorvastatin (LIPITOR) 40 MG tablet [Pharmacy Med Name: atorvastatin 40 mg tablet] 90 tablet 0    Sig: TAKE ONE TABLET BY MOUTH EVERYDAY AT BEDTIME     Cardiovascular:  Antilipid - Statins Failed - 03/23/2023  5:29 PM      Failed - Lipid Panel in normal range within the last 12 months    Cholesterol, Total  Date Value Ref Range Status  10/26/2022 141 100 - 199 mg/dL Final   Cholesterol  Date Value Ref Range Status  12/07/2011 133 0 - 200 mg/dL Final   Ldl Cholesterol, Calc  Date Value Ref Range Status  12/07/2011 63 0 - 100 mg/dL Final   LDL Chol Calc (NIH)  Date Value Ref Range Status  10/26/2022 75 0 - 99 mg/dL Final   HDL Cholesterol  Date Value Ref Range Status  12/07/2011 56 40 - 60 mg/dL Final   HDL  Date Value Ref Range Status  10/26/2022 40 >39 mg/dL Final   Triglycerides  Date Value Ref Range Status  10/26/2022 152 (H) 0 - 149 mg/dL Final  13/06/6577 71 0 - 200 mg/dL Final         Passed - Patient is not pregnant      Passed - Valid encounter within last 12 months    Recent Outpatient Visits           3 months ago Benign essential HTN   [place] [facility] [place], Marzella Schlein, MD   4 months ago Benign essential HTN   [place] [facility] [place], Marzella Schlein, MD   11 months ago Encounter for annual physical exam   [place] [facility] [place], Marzella Schlein, MD   1 year ago Benign essential HTN   [place] [facility] [place], Marzella Schlein, MD   2 years ago Encounter for annual physical exam   [place] [facility] [place], Marzella Schlein, MD       Future Appointments             In 1 month [place], Marzella Schlein, MD [facility]   In 2 months Wyline Mood, MD [place]  [facility]ology at [facility]

## 2023-03-24 MED ORDER — ALENDRONATE SODIUM 70 MG PO TABS
ORAL_TABLET | ORAL | 1 refills | Status: DC
Start: 2023-03-24 — End: 2023-07-08

## 2023-03-24 NOTE — Addendum Note (Signed)
Addended by: Julious Payer A on: 03/24/2023 08:13 AM   Modules accepted: Orders

## 2023-03-29 ENCOUNTER — Ambulatory Visit: Payer: Medicare PPO

## 2023-03-29 DIAGNOSIS — J4489 Other specified chronic obstructive pulmonary disease: Secondary | ICD-10-CM

## 2023-03-29 NOTE — Progress Notes (Signed)
Care Management & Coordination Services Pharmacy Note  03/29/2023 Name:  Lori Brady MRN:  [id number] DOB:  09-19-1946  Summary: Patient presents for follow-up pharmacy consult. She is accompanied by her sister Lori Brady, although the patient provides all of the history.   -Patient reports recent worsening of her breathing. She endorses worsening shortness of breath with minimal exertion and she doesn't feel her Trelegy is working as well anymore. She endorses increased non-purulent sputum production. She denies fevers or chills.   Recommendations/Changes made from today's visit: -STOP Trelegy  -START Breztri   Follow up plan: CPP follow-up 3 months   Subjective: Lori Brady is an 77 y.o. year old female who is a primary patient of Bacigalupo, Marzella Schlein, MD.  The care coordination team was consulted for assistance with disease management and care coordination needs.    Engaged with patient face to face for initial visit.  Recent office visits: 12/22/2022 Shirlee Latch, MD (PCP Office Visit) for Follow-up- No medication changes noted, No orders placed, Patient to follow-up in 4 months   10/26/2022 Shirlee Latch, MD (PCP Office Visit) for HTN- No medication changes noted, Lab orders placed, Patient to follow-up in 6 months  Recent consult visits: 12/21/2022 Mickel Duhamel, MD (Oncology) for Follow-up- No medication changes noted, Lab orders placed, Patient to follow-up in 3 months   11/10/2022 Louretta Shorten (Oncology) for Anemia- No medication changes noted, Lab orders placed, Referral to Gastroenterology order placed, BP: 146/57, Patient to follow-up in 6 weeks   11/04//202 Freda Munro, MD (Pulmonary) for Follow-up- Stopped: Prednisone 10 mg, Sulfamethoxazole, No orders placed, patient to follow-up in 6 months   08/12/2022 Hemang Wallis Mart, MD (Neurology) for Parkinson's Disease- No medication changes noted, No orders placed, No follow-up noted    08/11/2022 Festus Barren, MD (Vascular Surgery) for Benign Essential HTN- No medication changes noted, VAS US Carotid order placed, BP: 169/74, Patient to follow-up in 1 year   07/21/2022 Freda Munro, MD (Pulmonary) for Shortness of Breath- Started: Sulfamethoxazole-Trimethoprim 800-160 mg twice daily, No orders placed, BP: 148/62   Hospital visits: None in previous 6 months   Objective:  Lab Results  Component Value Date   CREATININE 0.52 03/22/2023   BUN 17 03/22/2023   EGFR 88 10/26/2022   GFRNONAA >60 03/22/2023   GFRAA 83 07/25/2020   NA 139 03/22/2023   K 3.7 03/22/2023   CALCIUM 9.0 03/22/2023   CO2 26 03/22/2023   GLUCOSE 105 (H) 03/22/2023    Lab Results  Component Value Date/Time   HGBA1C 5.8 (H) 10/26/2022 01:39 PM   HGBA1C 5.3 01/26/2017 12:08 AM    Last diabetic Eye exam: No results found for: "HMDIABEYEEXA"  Last diabetic Foot exam: No results found for: "HMDIABFOOTEX"   Lab Results  Component Value Date   CHOL 141 10/26/2022   HDL 40 10/26/2022   LDLCALC 75 10/26/2022   TRIG 152 (H) 10/26/2022   CHOLHDL 3.5 10/26/2022       Latest Ref Rng & Units 03/22/2023    1:55 PM 10/26/2022    1:39 PM 12/15/2021    2:34 PM  Hepatic Function  Total Protein 6.5 - 8.1 g/dL 7.5  6.7  7.0   Albumin 3.5 - 5.0 g/dL 4.1  4.2  4.5   AST 15 - 41 U/L 30  19  28    ALT 0 - 44 U/L 7  9  6    Alk Phosphatase 38 - 126 U/L 127  101  153   Total Bilirubin  0.3 - 1.2 mg/dL 0.9  0.5  0.5     Lab Results  Component Value Date/Time   TSH 1.580 01/04/2018 10:49 AM   TSH 6.432 (H) 01/26/2017 12:08 AM   TSH 4.280 04/02/2016 09:24 AM   FREET4 0.79 01/26/2017 12:08 AM       Latest Ref Rng & Units 03/22/2023    1:55 PM 12/21/2022   12:41 PM 10/26/2022    1:39 PM  CBC  WBC 4.0 - 10.5 K/uL 11.0  9.3  9.8   Hemoglobin 12.0 - 15.0 g/dL 16.1  09.6  8.3   Hematocrit 36.0 - 46.0 % 37.9  36.2  28.2   Platelets 150 - 400 K/uL 175  176  209     Lab Results  Component Value Date/Time    VD25OH 38.3 12/15/2021 02:34 PM   VITAMINB12 558 03/04/2023 02:31 PM   VITAMINB12 300 01/22/2020 03:13 PM    Clinical ASCVD: Yes  The ASCVD Risk score (Arnett DK, et al., 2019) failed to calculate for the following reasons:   The patient has a prior MI or stroke diagnosis       02/03/2023    1:08 PM 12/22/2022    2:40 PM 10/26/2022    1:44 PM  Depression screen PHQ 2/9  Decreased Interest 0 0 0  Down, Depressed, Hopeless 0 0 0  PHQ - 2 Score 0 0 0  Altered sleeping 0 1 0  Tired, decreased energy 0 1 2  Change in appetite 0 0 1  Feeling bad or failure about yourself  0 0 0  Trouble concentrating 0 0 0  Moving slowly or fidgety/restless 0 1 0  Suicidal thoughts 0 0 0  PHQ-9 Score 0 3 3  Difficult doing work/chores Not difficult at all Not difficult at all Not difficult at all     Social History   Tobacco Use  Smoking Status Former   Packs/day: 1.00   Years: 30.00   Additional pack years: 0.00   Total pack years: 30.00   Types: Cigarettes   Quit date: 08/01/2008   Years since quitting: 14.6  Smokeless Tobacco Never   BP Readings from Last 3 Encounters:  03/22/23 (!) 154/70  03/04/23 (!) 144/73  12/22/22 133/81   Pulse Readings from Last 3 Encounters:  03/22/23 81  03/04/23 80  12/22/22 80   Wt Readings from Last 3 Encounters:  03/22/23 127 lb 3.2 oz (57.7 kg)  03/04/23 125 lb 6.4 oz (56.9 kg)  02/03/23 126 lb (57.2 kg)   BMI Readings from Last 3 Encounters:  03/22/23 24.03 kg/m  03/04/23 23.69 kg/m  02/03/23 23.81 kg/m    Allergies  Allergen Reactions   Levofloxacin Nausea Only and Nausea And Vomiting    Can take if given something for n/v   Vicodin [Hydrocodone-Acetaminophen] Nausea Only    Medications Reviewed Today     Reviewed by Clydia Llano, CMA (Certified Medical Assistant) on 03/22/23 at 1400  Med List Status: <None>   Medication Order Taking? Sig Documenting Provider Last Dose Status Informant  albuterol (ACCUNEB) 0.63 MG/3ML  nebulizer solution [id number]  Take 3 mLs (0.63 mg total) by nebulization every 6 (six) hours as needed for wheezing. Yevonne Pax, MD  Active   albuterol (VENTOLIN HFA) 108 (90 Base) MCG/ACT inhaler [id number]  Inhale 2 puffs into the lungs every 6 (six) hours as needed for wheezing or shortness of breath. Yevonne Pax, MD  Active   albuterol (VENTOLIN HFA) 108 (90  Base) MCG/ACT inhaler [id number]  Inhale 2 puffs into the lungs every 6 (six) hours as needed for wheezing or shortness of breath. Erasmo Downer, MD  Active   alendronate (FOSAMAX) 70 MG tablet [id number]  TAKE ONE TABLET BY MOUTH EACH WEEK, ON AN EMPTY STOMACH BEFORE BREAKFAST WITH 8oz OF WATER AND REMAIN UPRIGHT FOR :30 Bacigalupo, Marzella Schlein, MD  Active   aspirin 81 MG EC tablet [id number]  Take 81 mg by mouth daily with breakfast. [provider]  Active   atorvastatin (LIPITOR) 40 MG tablet [id number]  Take 1 tablet (40 mg total) by mouth at bedtime. Erasmo Downer, MD  Active   azelastine (ASTELIN) 0.1 % nasal spray [id number]  Place 2 sprays into both nostrils daily at 6 (six) AM. [provider]  Active   betamethasone valerate (VALISONE) 0.1 % cream [id number]  APPLY TOPICALLY TO AFFECTED AREA SPARINGLY 2 TIMES DAILY. [name], MD  Active Self           Med Note [facility], MCKENZIE A   Thu Jan 23, 2021  1:24 PM) As needed  carbidopa-levodopa (SINEMET CR) 50-200 MG tablet [id number]  Take 1 tablet by mouth at bedtime. [provider]  Active   carbidopa-levodopa (SINEMET IR) 25-100 MG tablet [id number]  Take 3 tablets with breakfast, 2 tablets with lunch, 2 tablets with supper. [provider]  Active   cetirizine (ZYRTEC) 10 MG tablet [id number]  TAKE 1 TABLET BY MOUTH ONCE DAILY Yevonne Pax, MD  Active   Cholecalciferol (VITAMIN D) 2000 UNITS CAPS [id number]  Take 1 capsule by mouth daily. [provider]  Active Self  citalopram (CELEXA) 20 MG tablet [id number]  Take 1  tablet (20 mg total) by mouth daily. Erasmo Downer, MD  Active   clobetasol (TEMOVATE) 0.05 % external solution [id number]  Apply 1 application topically 2 (two) times daily. As needed [provider]  Active   clobetasol cream (TEMOVATE) 0.05 % [id number]  Apply 1 application topically 2 (two) times daily. As needed [provider]  Active   doxepin (SINEQUAN) 25 MG capsule [id number]  Take 1 capsule (25 mg total) by mouth at bedtime. Erasmo Downer, MD  Active   ferrous sulfate 325 (65 FE) MG EC tablet [id number]  Take 650 mg by mouth daily with breakfast. [provider]  Active   Fluticasone-Umeclidin-Vilant (TRELEGY ELLIPTA) 100-62.5-25 MCG/ACT AEPB [id number]  Inhale 1 puff into the lungs daily. Erasmo Downer, MD  Active   ibuprofen (ADVIL) 200 MG tablet [id number]  Take 200 mg by mouth every 6 (six) hours as needed. [provider]  Active   ketoconazole (NIZORAL) 2 % shampoo [id number]  Apply 1 application. topically 2 (two) times a week. [provider]  Active   melatonin 5 MG TABS [id number]  Take 1 tablet by mouth at bedtime. [provider]  Active   montelukast (SINGULAIR) 10 MG tablet [id number]  Take 1 tablet (10 mg total) by mouth every morning. Erasmo Downer, MD  Active   omeprazole (PRILOSEC) 40 MG capsule [id number]  Take 1 capsule (40 mg total) by mouth at bedtime. Wyline Mood, MD  Active             SDOH:  (Social Determinants of Health) assessments and interventions performed: Yes SDOH Interventions    Flowsheet Row Office Visit from 10/26/2022 in [facility] Clinical Support from 01/29/2022 in [facility] Clinical Support from 01/23/2021  in [facility]  SDOH Interventions     Food Insecurity Interventions -- Intervention Not Indicated --  Housing Interventions -- Intervention Not Indicated --  Transportation  Interventions -- Intervention Not Indicated --  Depression Interventions/Treatment  PHQ2-9 Score <4 Follow-up Not Indicated -- --  Financial Strain Interventions -- Intervention Not Indicated --  Physical Activity Interventions -- Intervention Not Indicated Patient Refused  Stress Interventions -- Intervention Not Indicated --  Social Connections Interventions -- Intervention Not Indicated --       Medication Assistance: None required.  Patient affirms current coverage meets needs.  Medication Access: Within the past 30 days, how often has patient missed a dose of medication? No Is a pillbox or other method used to improve adherence? Yes  Factors that may affect medication adherence? transportation problems Are meds synced by current pharmacy? No  Are meds delivered by current pharmacy? Yes  Does patient experience delays in picking up medications due to transportation concerns? No   [facility] Reviewed: Is patient disadvantaged to use [facility]?: No  Current Rx insurance plan: [employer] Name and location of Current pharmacy:  [facility] - [place], [place] - [address] [place] [place] [address] Phone: [phone] Fax: [phone]  [facility] - [place], [place] - [address] [place] [address] Phone: [phone] Fax: [phone]  [facility] services reviewed with patient today?: Yes  Patient requests to transfer care to [facility]?: Yes   Verbal consent obtained for [facility] enhanced pharmacy services (medication synchronization, adherence packaging, delivery coordination). A medication sync plan was created to allow patient to get all medications delivered once every 30 to 90 days per patient preference. Patient understands they have freedom to choose pharmacy and clinical pharmacist will coordinate care between all prescribers and [facility].   Compliance/Adherence/Medication fill history: Care Gaps: [facility] Lung Cancer Screening Covid  AWV  Star-Rating Drugs:   Assessment/Plan   Hyperlipidemia: (LDL goal < 55) -Uncontrolled -History of MI, Stroke  -Current treatment: Atorvastatin 40 mg nightly  -Current treatment: Aspirin 81 mg daily  -Medications previously tried: NA  -Continue current medications   COPD (Goal: control symptoms and prevent exacerbations) -Not ideally controlled -Former smoker, quit in 2009 -Current treatment  -START Breztri 2 puffs twice daily.  Albuterol nebulizer Ventolin HFA -Medications previously tried: NA  -Gold Grade: Gold 2 (FEV1 50-79%) -Current COPD Classification:  B (high sx, 0-1 moderate exacerbations, no hospitalizations) -MMRC/CAT score: 3 -Pulmonary function testing: FEV1 60%, FEV1/FVC 100% (May 2023)  -Exacerbations requiring treatment in last 6 months: None -Patient reports consistent use of maintenance inhaler -Frequency of rescue inhaler use: daily  -Patient reports recent worsening of her breathing. She endorses worsening shortness of breath with minimal exertion and she doesn't feel her Trelegy is working as well anymore. She endorses increased non-purulent sputum production. She denies fevers or chills.  -Inhaler technique was reviewed verbally today.  -Breo + Spiriva remain expensive for patient.  -START Breztri 2 puffs twice daily.   Osteoporosis(Goal Prevent fractures) -Controlled -Last DEXA Scan: 01/14/21   T-Score femoral neck: -2.4  T-Score total hip: -2.1  T-Score lumbar spine:  -2.0 -Patient is a candidate for pharmacologic treatment due to T-Score < -2.5 in femoral neck -Current treatment  Alendronate 70 mg weekly (started 2012 or 2017) Thursdays Vitamin D 2000 units daily  -Medications previously tried: Forteo  -Recommend 1200 mg of calcium daily from dietary  and supplemental sources. -Recommended to continue current medication  Angelena Sole, PharmD, Patsy [place], CPP  Clinical Pharmacist Practitioner  [facility] [phone]

## 2023-03-30 ENCOUNTER — Ambulatory Visit: Payer: Medicare PPO | Admitting: Nurse Practitioner

## 2023-03-31 ENCOUNTER — Ambulatory Visit: Payer: Medicare PPO | Admitting: Internal Medicine

## 2023-03-31 DIAGNOSIS — R0602 Shortness of breath: Secondary | ICD-10-CM

## 2023-04-06 ENCOUNTER — Ambulatory Visit: Payer: Medicare PPO | Admitting: Nurse Practitioner

## 2023-04-07 ENCOUNTER — Telehealth: Payer: Self-pay

## 2023-04-07 NOTE — Telephone Encounter (Signed)
Copied from CRM [id number]. Topic: General - Other >> Apr 07, 2023  4:16 PM Jannifer Rodney M wrote: Reason for CRM: Pt requests to speak with Tosha. Pt did not provide any other details. Cb# [phone]

## 2023-04-07 NOTE — Telephone Encounter (Signed)
Can you reach out to the patient please?   Angelena Sole, PharmD, Patsy [place], CPP  Clinical Pharmacist Practitioner  [facility] [phone]

## 2023-04-07 NOTE — Telephone Encounter (Signed)
Copied from CRM [id number]. Topic: General - Other >> Apr 07, 2023 12:16 PM Dominique A wrote: Reason for CRM: Pt is calling requesting a call back from Tosha at the office. Pt did not state why she want to speak with Tosha.

## 2023-04-08 ENCOUNTER — Telehealth: Payer: Self-pay

## 2023-04-08 ENCOUNTER — Encounter: Payer: Self-pay | Admitting: Nurse Practitioner

## 2023-04-08 ENCOUNTER — Ambulatory Visit (INDEPENDENT_AMBULATORY_CARE_PROVIDER_SITE_OTHER): Payer: Medicare PPO | Admitting: Nurse Practitioner

## 2023-04-08 VITALS — BP 169/75 | HR 86 | Temp 96.5°F | Resp 16 | Ht 61.0 in | Wt 126.0 lb

## 2023-04-08 DIAGNOSIS — R030 Elevated blood-pressure reading, without diagnosis of hypertension: Secondary | ICD-10-CM

## 2023-04-08 DIAGNOSIS — J4489 Other specified chronic obstructive pulmonary disease: Secondary | ICD-10-CM

## 2023-04-08 DIAGNOSIS — K219 Gastro-esophageal reflux disease without esophagitis: Secondary | ICD-10-CM | POA: Diagnosis not present

## 2023-04-08 DIAGNOSIS — R0602 Shortness of breath: Secondary | ICD-10-CM | POA: Diagnosis not present

## 2023-04-08 MED ORDER — ALBUTEROL SULFATE HFA 108 (90 BASE) MCG/ACT IN AERS
2.0000 | INHALATION_SPRAY | Freq: Four times a day (QID) | RESPIRATORY_TRACT | 1 refills | Status: DC | PRN
Start: 2023-04-08 — End: 2023-07-07

## 2023-04-08 MED ORDER — BREZTRI AEROSPHERE 160-9-4.8 MCG/ACT IN AERO: 2.0000 | INHALATION_SPRAY | Freq: Two times a day (BID) | RESPIRATORY_TRACT | 11 refills | Status: DC

## 2023-04-08 NOTE — Progress Notes (Signed)
[facility] [address] [place], [place] [address]  Internal MEDICINE  Office Visit Note  Patient Name: Lori Brady  [id number]  [id number]  Date of Service: 04/08/2023  Chief Complaint  Patient presents with   Gastroesophageal Reflux   Hypertension   Hyperlipidemia   Follow-up    HPI Lori Brady presents for a follow-up visit for COPD COPD -- taking breztri inhaler, working well for her. Still uses albuterol inhaler 3 times daily. Has a nebulizer but does not use it. Applying for financial assistance for breztri.  Elevated blood pressure reading.  SOB -- using albuterol inhaler 3x daily. Not using her nebulizer. Had her PFT done. No significant change from 2022. Still showing moderate obstructive lung disease and mild restrictive lung disease.  GERD -- controlled with omeprazole   Current Medication: Outpatient Encounter Medications as of 04/08/2023  Medication Sig Note   albuterol (ACCUNEB) 0.63 MG/3ML nebulizer solution Take 3 mLs (0.63 mg total) by nebulization every 6 (six) hours as needed for wheezing.    alendronate (FOSAMAX) 70 MG tablet TAKE ONE TABLET BY MOUTH EACH WEEK, ON AN EMPTY STOMACH BEFORE BREAKFAST WITH 8oz OF WATER AND REMAIN UPRIGHT FOR :30    aspirin 81 MG EC tablet Take 81 mg by mouth daily with breakfast.    atorvastatin (LIPITOR) 40 MG tablet TAKE ONE TABLET BY MOUTH EVERYDAY AT BEDTIME    azelastine (ASTELIN) 0.1 % nasal spray Place 2 sprays into both nostrils daily at 6 (six) AM.    betamethasone valerate (VALISONE) 0.1 % cream APPLY TOPICALLY TO AFFECTED AREA SPARINGLY 2 TIMES DAILY. 01/23/2021: As needed   Budeson-Glycopyrrol-Formoterol (BREZTRI AEROSPHERE) 160-9-4.8 MCG/ACT AERO Inhale 2 puffs into the lungs 2 (two) times daily.    carbidopa-levodopa (SINEMET CR) 50-200 MG tablet Take 1 tablet by mouth at bedtime.    carbidopa-levodopa (SINEMET IR) 25-100 MG tablet Take 3 tablets with breakfast, 2 tablets with lunch, 2 tablets with supper.     cetirizine (ZYRTEC) 10 MG tablet TAKE 1 TABLET BY MOUTH ONCE DAILY    Cholecalciferol (VITAMIN D) 2000 UNITS CAPS Take 1 capsule by mouth daily.    citalopram (CELEXA) 20 MG tablet Take 1 tablet (20 mg total) by mouth daily.    clobetasol (TEMOVATE) 0.05 % external solution Apply 1 application topically 2 (two) times daily. As needed    clobetasol cream (TEMOVATE) 0.05 % Apply 1 application topically 2 (two) times daily. As needed    doxepin (SINEQUAN) 25 MG capsule Take 1 capsule (25 mg total) by mouth at bedtime.    ferrous sulfate 325 (65 FE) MG EC tablet Take 650 mg by mouth daily with breakfast.    ibuprofen (ADVIL) 200 MG tablet Take 200 mg by mouth every 6 (six) hours as needed.    ketoconazole (NIZORAL) 2 % shampoo Apply 1 application. topically 2 (two) times a week.    melatonin 5 MG TABS Take 1 tablet by mouth at bedtime.    montelukast (SINGULAIR) 10 MG tablet Take 1 tablet (10 mg total) by mouth every morning.    omeprazole (PRILOSEC) 40 MG capsule Take 1 capsule (40 mg total) by mouth at bedtime.    [DISCONTINUED] albuterol (VENTOLIN HFA) 108 (90 Base) MCG/ACT inhaler Inhale 2 puffs into the lungs every 6 (six) hours as needed for wheezing or shortness of breath.    [DISCONTINUED] albuterol (VENTOLIN HFA) 108 (90 Base) MCG/ACT inhaler Inhale 2 puffs into the lungs every 6 (six) hours as needed for wheezing or shortness  of breath.    [DISCONTINUED] Fluticasone-Umeclidin-Vilant (TRELEGY ELLIPTA) 100-62.5-25 MCG/ACT AEPB Inhale 1 puff into the lungs daily.    albuterol (VENTOLIN HFA) 108 (90 Base) MCG/ACT inhaler Inhale 2 puffs into the lungs every 6 (six) hours as needed for wheezing or shortness of breath.    No facility-administered encounter medications on file as of 04/08/2023.    Surgical History: Past Surgical History:  Procedure Laterality Date   BREAST BIOPSY Bilateral 1960's to 90's   breast cysts     CATARACT EXTRACTION, BILATERAL     COLONOSCOPY WITH PROPOFOL N/A  06/01/2022   Procedure: COLONOSCOPY WITH PROPOFOL;  Surgeon: Wyline Mood, MD;  Location: [facility] ENDOSCOPY;  Service: Gastroenterology;  Laterality: N/A;   ELBOW SURGERY     right   right wrist fracture     TUBAL LIGATION     WRIST SURGERY Left     Medical History: Past Medical History:  Diagnosis Date   Arthritis    Asthma    Crohn's disease (HCC)    Emphysema of lung (HCC)    GERD (gastroesophageal reflux disease)    Hip fracture (HCC) 01/26/2017   History of chicken pox    History of heart attack    Hx of completed stroke    Hypercholesteremia    Hypertension    Parkinson disease    Pneumonia    Seasonal allergies    Sepsis (HCC)    Septic shock (HCC) 10/08/2016   UTI (lower urinary tract infection)     Family History: Family History  Problem Relation Age of Onset   Cancer Sister        ovarian/uterine/breast   Breast cancer Sister 54   Cancer Sister        breast   Heart disease Sister    Breast cancer Sister 66   Heart disease Father    Cancer Father        liver   Pancreatic cancer Mother    Heart disease Brother    Heart attack Brother    Cancer Brother        liver   Heart disease Sister    Diabetes Maternal Grandmother     Social History   Socioeconomic History   Marital status: Widowed    Spouse name: Not on file   Number of children: 3   Years of education: Not on file   Highest education level: Some college, no degree  Occupational History   Occupation: retired  Tobacco Use   Smoking status: Former    Packs/day: 1.00    Years: 30.00    Additional pack years: 0.00    Total pack years: 30.00    Types: Cigarettes    Quit date: 08/01/2008    Years since quitting: 14.6   Smokeless tobacco: Never  Vaping Use   Vaping Use: Never used  Substance and Sexual Activity   Alcohol use: Not Currently    Alcohol/week: 1.0 standard drink of alcohol    Types: 1 Shots of liquor per week   Drug use: No   Sexual activity: Not on file  Other Topics  Concern   Not on file  Social History Narrative   Not on file   Social Determinants of Health   Financial Resource Strain: Low Risk  (02/02/2023)   Overall Financial Resource Strain (CARDIA)    Difficulty of Paying Living Expenses: Not very hard  Food Insecurity: No Food Insecurity (02/02/2023)   Hunger Vital Sign    Worried About Running  Out of Food in the Last Year: Never true    Ran Out of Food in the Last Year: Never true  Transportation Needs: No Transportation Needs (02/02/2023)   PRAPARE - Administrator, Civil Service (Medical): No    Lack of Transportation (Non-Medical): No  Physical Activity: Insufficiently Active (02/02/2023)   Exercise Vital Sign    Days of Exercise per Week: 3 days    Minutes of Exercise per Session: 20 min  Stress: No Stress Concern Present (02/02/2023)   [employer] of Occupational Health - Occupational Stress Questionnaire    Feeling of Stress : Not at all  Social Connections: Unknown (02/02/2023)   Social Connection and Isolation Panel [NHANES]    Frequency of Communication with Friends and Family: More than three times a week    Frequency of Social Gatherings with Friends and Family: Once a week    Attends Religious Services: Not on Insurance claims handler of Clubs or Organizations: No    Attends Banker Meetings: Never    Marital Status: Widowed  Intimate Partner Violence: Not At Risk (02/03/2023)   Humiliation, Afraid, Rape, and Kick questionnaire    Fear of Current or Ex-Partner: No    Emotionally Abused: No    Physically Abused: No    Sexually Abused: No      Review of Systems  Constitutional:  Negative for chills, fatigue and unexpected weight change.  HENT:  Negative for congestion, postnasal drip, rhinorrhea, sneezing and sore throat.   Eyes:  Negative for redness.  Respiratory:  Positive for shortness of breath (intermittent). Negative for cough, chest tightness and wheezing.   Cardiovascular: Negative.   Negative for chest pain and palpitations.  Gastrointestinal:  Negative for abdominal pain, constipation, diarrhea, nausea and vomiting.  Genitourinary:  Negative for dysuria and frequency.  Musculoskeletal:  Negative for arthralgias, back pain, joint swelling and neck pain.  Skin:  Negative for rash.  Neurological: Negative.  Negative for tremors and numbness.  Hematological:  Negative for adenopathy. Does not bruise/bleed easily.  Psychiatric/Behavioral:  Negative for behavioral problems (Depression), sleep disturbance and suicidal ideas. The patient is not nervous/anxious.     Vital Signs: BP (!) 169/75   Pulse 86   Temp (!) 96.5 F (35.8 C)   Resp 16   Ht 5\' 1"  (1.549 m)   Wt 126 lb (57.2 kg)   SpO2 95%   BMI 23.81 kg/m    Physical Exam Vitals reviewed.  Constitutional:      General: She is not in acute distress.    Appearance: Normal appearance. She is not ill-appearing.  HENT:     Head: Normocephalic and atraumatic.  Eyes:     Pupils: Pupils are equal, round, and reactive to light.  Cardiovascular:     Rate and Rhythm: Normal rate and regular rhythm.  Pulmonary:     Effort: Pulmonary effort is normal. No respiratory distress.  Neurological:     Mental Status: She is alert and oriented to person, place, and time.  Psychiatric:        Mood and Affect: Mood normal.        Behavior: Behavior normal.        Assessment/Plan: 1. COPD (chronic obstructive pulmonary disease) with chronic bronchitis Samples of breztri provided to patient. Refills of breztri and albuterol ordered.  - albuterol (VENTOLIN HFA) 108 (90 Base) MCG/ACT inhaler; Inhale 2 puffs into the lungs every 6 (six) hours as needed for wheezing or  shortness of breath.  Dispense: 18 g; Refill: 1  2. SOB (shortness of breath) Continue albuterol inhaler as needed. Encouraged patient to try using her nebulizer instead to see if it relieves the SOB longer or better than the albuterol inhaler. - albuterol  (VENTOLIN HFA) 108 (90 Base) MCG/ACT inhaler; Inhale 2 puffs into the lungs every 6 (six) hours as needed for wheezing or shortness of breath.  Dispense: 18 g; Refill: 1  3. Gastroesophageal reflux disease without esophagitis Continue omeprazole as prescribed  4. Elevated blood pressure reading Elevated blood pressure, follow up with PCP   General Counseling: Blannie verbalizes understanding of the findings of todays visit and agrees with plan of treatment. I have discussed any further diagnostic evaluation that may be needed or ordered today. We also reviewed her medications today. she has been encouraged to call the office with any questions or concerns that should arise related to todays visit.    No orders of the defined types were placed in this encounter.   Meds ordered this encounter  Medications   albuterol (VENTOLIN HFA) 108 (90 Base) MCG/ACT inhaler    Sig: Inhale 2 puffs into the lungs every 6 (six) hours as needed for wheezing or shortness of breath.    Dispense:  18 g    Refill:  1   Budeson-Glycopyrrol-Formoterol (BREZTRI AEROSPHERE) 160-9-4.8 MCG/ACT AERO    Sig: Inhale 2 puffs into the lungs 2 (two) times daily.    Dispense:  10.7 g    Refill:  11    Return in about 6 months (around 10/09/2023) for F/U, pulmonary only with DSK or Aziyah Provencal. .   Total time spent:30 Minutes Time spent includes review of chart, medications, test results, and follow up plan with the patient.   [place] Controlled Substance Database was reviewed by me.  This patient was seen by Sallyanne Kuster, FNP-C in collaboration with Dr. Beverely Risen as a part of collaborative care agreement.   Avaeh Ewer R. Tedd Sias, MSN, FNP-C Internal medicine

## 2023-04-08 NOTE — Progress Notes (Signed)
Care Coordination Pharmacy Assistant   Name: Lori Brady  MRN: [id number] DOB: [birth year]  Reason for Encounter: New Patient Assistance Application for Breztri  Patient was provided a sample of Breztri and she contacted me to inform me that she is doing well on the medication and it is okay to go forward with the patient assistance application via [employer]ME. Patient agreeable to have me start patient assistance application online for her.  Patient also inquired about a colonoscopy prep kit that her [facility] provider sent to [facility]. I advised patient that I would ask pharmacy about her prescription.  Per Chasity with [facility] they did not receive patient's colonoscopy kit. I contacted the patient back but he sister advised that she had left for a doctors appointment.  Patient Assistance Application for [employer] started online. Patient was approved for Breztri via AZ&ME and is enrolled as of today 04/08/2023 end enrollment is 11/16/2023.  CPP notified and sent task to send prescription over to [facility]. Medications: Outpatient Encounter Medications as of 04/08/2023  Medication Sig Note   albuterol (ACCUNEB) 0.63 MG/3ML nebulizer solution Take 3 mLs (0.63 mg total) by nebulization every 6 (six) hours as needed for wheezing.    albuterol (VENTOLIN HFA) 108 (90 Base) MCG/ACT inhaler Inhale 2 puffs into the lungs every 6 (six) hours as needed for wheezing or shortness of breath.    albuterol (VENTOLIN HFA) 108 (90 Base) MCG/ACT inhaler Inhale 2 puffs into the lungs every 6 (six) hours as needed for wheezing or shortness of breath.    alendronate (FOSAMAX) 70 MG tablet TAKE ONE TABLET BY MOUTH EACH WEEK, ON AN EMPTY STOMACH BEFORE BREAKFAST WITH 8oz OF WATER AND REMAIN UPRIGHT FOR :30    aspirin 81 MG EC tablet Take 81 mg by mouth daily with breakfast.    atorvastatin (LIPITOR) 40 MG tablet TAKE ONE TABLET BY MOUTH EVERYDAY AT BEDTIME    azelastine (ASTELIN) 0.1 % nasal spray Place 2 sprays into  both nostrils daily at 6 (six) AM.    betamethasone valerate (VALISONE) 0.1 % cream APPLY TOPICALLY TO AFFECTED AREA SPARINGLY 2 TIMES DAILY. 01/23/2021: As needed   carbidopa-levodopa (SINEMET CR) 50-200 MG tablet Take 1 tablet by mouth at bedtime.    carbidopa-levodopa (SINEMET IR) 25-100 MG tablet Take 3 tablets with breakfast, 2 tablets with lunch, 2 tablets with supper.    cetirizine (ZYRTEC) 10 MG tablet TAKE 1 TABLET BY MOUTH ONCE DAILY    Cholecalciferol (VITAMIN D) 2000 UNITS CAPS Take 1 capsule by mouth daily.    citalopram (CELEXA) 20 MG tablet Take 1 tablet (20 mg total) by mouth daily.    clobetasol (TEMOVATE) 0.05 % external solution Apply 1 application topically 2 (two) times daily. As needed    clobetasol cream (TEMOVATE) 0.05 % Apply 1 application topically 2 (two) times daily. As needed    doxepin (SINEQUAN) 25 MG capsule Take 1 capsule (25 mg total) by mouth at bedtime.    ferrous sulfate 325 (65 FE) MG EC tablet Take 650 mg by mouth daily with breakfast.    Fluticasone-Umeclidin-Vilant (TRELEGY ELLIPTA) 100-62.5-25 MCG/ACT AEPB Inhale 1 puff into the lungs daily.    ibuprofen (ADVIL) 200 MG tablet Take 200 mg by mouth every 6 (six) hours as needed.    ketoconazole (NIZORAL) 2 % shampoo Apply 1 application. topically 2 (two) times a week.    melatonin 5 MG TABS Take 1 tablet by mouth at bedtime.    montelukast (SINGULAIR) 10 MG tablet Take 1  tablet (10 mg total) by mouth every morning.    omeprazole (PRILOSEC) 40 MG capsule Take 1 capsule (40 mg total) by mouth at bedtime.    No facility-administered encounter medications on file as of 04/08/2023.    Adelene Idler, CPA/CMA Clinical Pharmacist Assistant Phone: [phone]

## 2023-04-08 NOTE — Addendum Note (Signed)
Addended by: Julious Payer A on: 04/08/2023 03:07 PM   Modules accepted: Orders

## 2023-04-09 NOTE — Telephone Encounter (Signed)
Pt requesting to speak with Tosha. Stated she wanted to ask her for an update on her colonoscopy prep kit that her GI sent to [facility].  Per notes from [place] on 04/08/23  2:36 PM advised pt per Chasity with [facility] they did not receive the patient's colonoscopy kit. Pt verbalized understanding and stated she would contact GI next week as they are now closed.  Please advise.

## 2023-04-14 DIAGNOSIS — G4752 REM sleep behavior disorder: Secondary | ICD-10-CM | POA: Diagnosis not present

## 2023-04-14 DIAGNOSIS — H8112 Benign paroxysmal vertigo, left ear: Secondary | ICD-10-CM | POA: Diagnosis not present

## 2023-04-14 DIAGNOSIS — G20C Parkinsonism, unspecified: Secondary | ICD-10-CM | POA: Diagnosis not present

## 2023-04-14 DIAGNOSIS — R2689 Other abnormalities of gait and mobility: Secondary | ICD-10-CM | POA: Diagnosis not present

## 2023-04-14 DIAGNOSIS — Z8673 Personal history of transient ischemic attack (TIA), and cerebral infarction without residual deficits: Secondary | ICD-10-CM | POA: Diagnosis not present

## 2023-04-15 ENCOUNTER — Telehealth: Payer: Self-pay

## 2023-04-15 NOTE — Progress Notes (Signed)
Care Management & Coordination Services Pharmacy Team  Reason for Encounter: Medication coordination and delivery  Contacted patient to discuss medications and coordinate delivery from [facility]. Unsuccessful outreach. Left voicemail for patient to return call.  Cycle dispensing form sent to Julious Payer, [facility] for review.   Last adherence delivery date:03/25/2023      Unable to reach patient to completed Medication Coordination form. Form was completed based on last month delivery. [facility] will contact patient to confirm delivery.Lori Brady, CPP was notified I was unable to reach patient  Patient is due for next adherence delivery on: 04/26/2023  This delivery to include: Adherence Packaging  30 Days  Alendronate 70 mg 1 tablet daily (Breakfast) Aspirin 81 mg 1 tablet daily (Lunch) Atorvastatin 40 mg 1 tablet daily (Bedtime) Carbidopa-Levodopa 50-200 mg 1 tablet daily (Bedtime) Carbidopa-Levodopa 25-100 mg 7 tablets daily (3 Breakfast, 2 Lunch, 2 Evening Meal) Citalopram 20 mg 1 tablet daily (Breakfast) Doxepin 25 mg 1 tablet daily (Bedtime) Singular 10 mg 1 tablet daily (Breakfast) Omeprazole 40 mg 1 tablet daily (Bedtime) Albuterol Inhaler 2 puffs q6h prn  Patient declined the following medications this month: Prevident Mouth Wash- not needed Zyrtec 10 mg 1 tablet daily (Bedtime)-Per patient she has sufficient supply  No refill request needed.  Delivery scheduled for 04/26/2023 (First route). Unable to speak with patient to confirm date.    Chart review: Recent office visits:  None ID  Recent consult visits:  04/08/2023 Alyssa Abernathy (Pulmonology) No Medication changes noted, Samples of breztri provided to patient, Return in about 6 months   Hospital visits:  None in previous 6 months  Medications: Outpatient Encounter Medications as of 04/15/2023  Medication Sig Note   albuterol (ACCUNEB) 0.63 MG/3ML nebulizer solution Take 3 mLs (0.63 mg  total) by nebulization every 6 (six) hours as needed for wheezing.    albuterol (VENTOLIN HFA) 108 (90 Base) MCG/ACT inhaler Inhale 2 puffs into the lungs every 6 (six) hours as needed for wheezing or shortness of breath.    alendronate (FOSAMAX) 70 MG tablet TAKE ONE TABLET BY MOUTH EACH WEEK, ON AN EMPTY STOMACH BEFORE BREAKFAST WITH 8oz OF WATER AND REMAIN UPRIGHT FOR :30    aspirin 81 MG EC tablet Take 81 mg by mouth daily with breakfast.    atorvastatin (LIPITOR) 40 MG tablet TAKE ONE TABLET BY MOUTH EVERYDAY AT BEDTIME    azelastine (ASTELIN) 0.1 % nasal spray Place 2 sprays into both nostrils daily at 6 (six) AM.    betamethasone valerate (VALISONE) 0.1 % cream APPLY TOPICALLY TO AFFECTED AREA SPARINGLY 2 TIMES DAILY. 01/23/2021: As needed   Budeson-Glycopyrrol-Formoterol (BREZTRI AEROSPHERE) 160-9-4.8 MCG/ACT AERO Inhale 2 puffs into the lungs 2 (two) times daily.    carbidopa-levodopa (SINEMET CR) 50-200 MG tablet Take 1 tablet by mouth at bedtime.    carbidopa-levodopa (SINEMET IR) 25-100 MG tablet Take 3 tablets with breakfast, 2 tablets with lunch, 2 tablets with supper.    cetirizine (ZYRTEC) 10 MG tablet TAKE 1 TABLET BY MOUTH ONCE DAILY    Cholecalciferol (VITAMIN D) 2000 UNITS CAPS Take 1 capsule by mouth daily.    citalopram (CELEXA) 20 MG tablet Take 1 tablet (20 mg total) by mouth daily.    clobetasol (TEMOVATE) 0.05 % external solution Apply 1 application topically 2 (two) times daily. As needed    clobetasol cream (TEMOVATE) 0.05 % Apply 1 application topically 2 (two) times daily. As needed    doxepin (SINEQUAN) 25 MG capsule Take 1 capsule (25  mg total) by mouth at bedtime.    ferrous sulfate 325 (65 FE) MG EC tablet Take 650 mg by mouth daily with breakfast.    ibuprofen (ADVIL) 200 MG tablet Take 200 mg by mouth every 6 (six) hours as needed.    ketoconazole (NIZORAL) 2 % shampoo Apply 1 application. topically 2 (two) times a week.    melatonin 5 MG TABS Take 1 tablet by  mouth at bedtime.    montelukast (SINGULAIR) 10 MG tablet Take 1 tablet (10 mg total) by mouth every morning.    omeprazole (PRILOSEC) 40 MG capsule Take 1 capsule (40 mg total) by mouth at bedtime.    No facility-administered encounter medications on file as of 04/15/2023.   BP Readings from Last 3 Encounters:  04/08/23 (!) 169/75  03/22/23 (!) 154/70  03/04/23 (!) 144/73    Pulse Readings from Last 3 Encounters:  04/08/23 86  03/22/23 81  03/04/23 80    Lab Results  Component Value Date/Time   HGBA1C 5.8 (H) 10/26/2022 01:39 PM   HGBA1C 5.3 01/26/2017 12:08 AM   Lab Results  Component Value Date   CREATININE 0.52 03/22/2023   BUN 17 03/22/2023   GFRNONAA >60 03/22/2023   GFRAA 83 07/25/2020   NA 139 03/22/2023   K 3.7 03/22/2023   CALCIUM 9.0 03/22/2023   CO2 26 03/22/2023     Bessie Kellihan,CPA Clinical Pharmacist Assistant [phone]

## 2023-04-16 ENCOUNTER — Encounter: Payer: Self-pay | Admitting: Gastroenterology

## 2023-04-19 ENCOUNTER — Other Ambulatory Visit: Payer: Self-pay

## 2023-04-19 ENCOUNTER — Telehealth: Payer: Self-pay

## 2023-04-19 ENCOUNTER — Encounter: Payer: Self-pay | Admitting: Anesthesiology

## 2023-04-19 ENCOUNTER — Ambulatory Visit
Admission: RE | Admit: 2023-04-19 | Discharge: 2023-04-19 | Disposition: A | Discharge status: Home / Self Care | Payer: Medicare PPO | Attending: Gastroenterology | Admitting: Gastroenterology

## 2023-04-19 ENCOUNTER — Encounter
Admission: RE | Disposition: A | Discharge status: Home / Self Care | Payer: Self-pay | Source: Home / Self Care | Attending: Gastroenterology

## 2023-04-19 DIAGNOSIS — Z538 Procedure and treatment not carried out for other reasons: Secondary | ICD-10-CM | POA: Insufficient documentation

## 2023-04-19 DIAGNOSIS — Z791 Long term (current) use of non-steroidal anti-inflammatories (NSAID): Secondary | ICD-10-CM

## 2023-04-19 DIAGNOSIS — D508 Other iron deficiency anemias: Secondary | ICD-10-CM | POA: Diagnosis not present

## 2023-04-19 DIAGNOSIS — K219 Gastro-esophageal reflux disease without esophagitis: Secondary | ICD-10-CM | POA: Diagnosis not present

## 2023-04-19 HISTORY — PX: ESOPHAGOGASTRODUODENOSCOPY (EGD) WITH PROPOFOL: SHX5813

## 2023-04-19 HISTORY — PX: COLONOSCOPY WITH PROPOFOL: SHX5780

## 2023-04-19 SURGERY — ESOPHAGOGASTRODUODENOSCOPY (EGD) WITH PROPOFOL
Anesthesia: General

## 2023-04-19 MED ORDER — SODIUM CHLORIDE 0.9 % IV SOLN: INTRAVENOUS | Status: DC

## 2023-04-19 MED ORDER — SUTAB 1479-225-188 MG PO TABS: ORAL_TABLET | ORAL | 0 refills | Status: DC

## 2023-04-19 NOTE — Anesthesia Preprocedure Evaluation (Deleted)
Anesthesia Evaluation  Patient identified by MRN, date of birth, ID band Patient awake    Reviewed: Allergy & Precautions, NPO status , Patient's Chart, lab work & pertinent test results  Airway Mallampati: III  TM Distance: >3 FB Neck ROM: full    Dental  (+) Teeth Intact   Pulmonary asthma , resolved, COPD,  COPD inhaler, former smoker    + wheezing      Cardiovascular Exercise Tolerance: Poor hypertension, + CAD, + Past MI and + Peripheral Vascular Disease  Normal cardiovascular exam  Carotid artery stenosis Duplex today show velocities that fall in the upper end of the 1 to 39% range on the right and in the 40 to 59% range on the left.  The left vertebral artery does demonstrate retrograde flow consistent with subclavian steal syndrome.  No worrisome symptoms and that her degree of stenosis no role for intervention  Subclavian steal syndrome of left subclavian artery Duplex today show velocities that fall in the upper end of the 1 to 39% range on the right and in the 40 to 59% range on the left.  The left vertebral artery does demonstrate retrograde flow consistent with subclavian steal syndrom    Neuro/Psych   Anxiety      Neuromuscular disease CVA (stroke 2007-vocal cord paralysis]stable)  negative psych ROS   GI/Hepatic Neg liver ROS,GERD  ,,  Endo/Other  negative endocrine ROS    Renal/GU negative Renal ROS  negative genitourinary   Musculoskeletal  (+) Arthritis ,    Abdominal   Peds  Hematology Iron deficient anemia- improved with treatment   Anesthesia Other Findings Past Medical History: No date: Arthritis No date: Asthma No date: Crohn's disease (HCC) No date: Emphysema of lung (HCC) No date: GERD (gastroesophageal reflux disease) 01/26/2017: Hip fracture (HCC) No date: History of chicken pox No date: History of heart attack No date: Hx of completed stroke No date: Hypercholesteremia No date:  Hypertension No date: Parkinson disease (HCC) No date: Pneumonia No date: Seasonal allergies No date: Sepsis (HCC) 10/08/2016: Septic shock (HCC) No date: UTI (lower urinary tract infection)  Past Surgical History: 1960's to 90's: BREAST BIOPSY; Bilateral No date: breast cysts No date: CATARACT EXTRACTION, BILATERAL No date: ELBOW SURGERY     Comment:  right No date: right wrist fracture No date: TUBAL LIGATION No date: WRIST SURGERY; Left     Reproductive/Obstetrics negative OB ROS                             Anesthesia Physical Anesthesia Plan  ASA: 3  Anesthesia Plan: General   Post-op Pain Management: Minimal or no pain anticipated   Induction: Intravenous  PONV Risk Score and Plan: 3 and Propofol infusion, TIVA and Ondansetron  Airway Management Planned: Nasal Cannula  Additional Equipment: None  Intra-op Plan:   Post-operative Plan:   Informed Consent:      Dental advisory given  Plan Discussed with: CRNA and Surgeon  Anesthesia Plan Comments: (INCOMPLETE. CANCELED PRE-OP for poor prep)        Anesthesia Quick Evaluation

## 2023-04-19 NOTE — Progress Notes (Signed)
Patient arrived for procedure but stated she did not tolerate her prep having vomited a large portion of it.  Dr Tobi Bastos spoke with the patient and it was decided to reschedule her colonoscopy for a later date.  The office will call with a new appointment.

## 2023-04-19 NOTE — Telephone Encounter (Signed)
Called patient to reschedule today's colonoscopy and EGD but she did not pick up and her voicemail is not set-up yet. I will call her again later.

## 2023-04-20 ENCOUNTER — Encounter: Payer: Self-pay | Admitting: Gastroenterology

## 2023-04-21 ENCOUNTER — Telehealth: Payer: Self-pay

## 2023-04-21 NOTE — Telephone Encounter (Signed)
Copied from CRM [id number]. Topic: General - Other >> Apr 21, 2023  2:45 PM Dominique A wrote: Reason for CRM: Pt is calling back requesting to speak with PCP nurse regarding her medication: Budeson-Glycopyrrol-Formoterol (BREZTRI AEROSPHERE) 160-9-4.8 MCG/ACT AERO. Per pt she still has not received a phone call back from the nurse. Please advise.  Pt states her other medication will be delivered by Monday and would like for this to be delivered as well.

## 2023-04-22 MED ORDER — BREZTRI AEROSPHERE 160-9-4.8 MCG/ACT IN AERO: 2.0000 | INHALATION_SPRAY | Freq: Two times a day (BID) | RESPIRATORY_TRACT | 11 refills | Status: DC

## 2023-04-22 NOTE — Addendum Note (Signed)
Addended by: Hyacinth Meeker on: 04/22/2023 09:11 AM   Modules accepted: Orders

## 2023-04-22 NOTE — Telephone Encounter (Signed)
[facility].

## 2023-04-28 NOTE — Telephone Encounter (Signed)
Called patient again and he did not pick up the phone as it only rang with no options to leave a voicemail. I will send her a MyChart message letting her know that she needs to reschedule her EGD and colonoscopy per Dr. Tobi Bastos. Dx: Anemia D50.8 and GERD K21.9

## 2023-04-29 ENCOUNTER — Encounter: Payer: Medicare PPO | Admitting: Family Medicine

## 2023-05-10 ENCOUNTER — Encounter: Payer: Medicare PPO | Admitting: Family Medicine

## 2023-05-10 NOTE — Progress Notes (Deleted)
Complete physical exam   Patient: Lori Brady   DOB: [birth year]   77 y.o. Female  MRN: [id number] Visit Date: 05/10/2023  Today's healthcare provider: Shirlee Latch, MD   No chief complaint on file.  Subjective    Lori Brady is a 77 y.o. female who presents today for a complete physical exam.  She reports consuming a {diet types:17450} diet. {Exercise:19826} She generally feels {well/fairly well/poorly:18703}. She reports sleeping {well/fairly well/poorly:18703}. She {does/does not:200015} have additional problems to discuss today.  HPI  ***  Past Medical History:  Diagnosis Date   Arthritis    Asthma    Crohn's disease (HCC)    Emphysema of lung (HCC)    GERD (gastroesophageal reflux disease)    Hip fracture (HCC) 01/26/2017   History of chicken pox    History of heart attack    Hx of completed stroke    Hypercholesteremia    Hypertension    Parkinson disease    Pneumonia    Seasonal allergies    Sepsis (HCC)    Septic shock (HCC) 10/08/2016   UTI (lower urinary tract infection)    Past Surgical History:  Procedure Laterality Date   BREAST BIOPSY Bilateral 1960's to 90's   breast cysts     CATARACT EXTRACTION, BILATERAL     COLONOSCOPY WITH PROPOFOL N/A 06/01/2022   Procedure: COLONOSCOPY WITH PROPOFOL;  Surgeon: Wyline Mood, MD;  Location: [facility] ENDOSCOPY;  Service: Gastroenterology;  Laterality: N/A;   COLONOSCOPY WITH PROPOFOL N/A 04/19/2023   Procedure: COLONOSCOPY WITH PROPOFOL;  Surgeon: Wyline Mood, MD;  Location: [facility] ENDOSCOPY;  Service: Gastroenterology;  Laterality: N/A;   ELBOW SURGERY     right   ESOPHAGOGASTRODUODENOSCOPY (EGD) WITH PROPOFOL N/A 04/19/2023   Procedure: ESOPHAGOGASTRODUODENOSCOPY (EGD) WITH PROPOFOL;  Surgeon: Wyline Mood, MD;  Location: [facility] ENDOSCOPY;  Service: Gastroenterology;  Laterality: N/A;   right wrist fracture     TUBAL LIGATION     WRIST SURGERY Left    Social History   Socioeconomic History   Marital status:  Widowed    Spouse name: Not on file   Number of children: 3   Years of education: Not on file   Highest education level: Some college, no degree  Occupational History   Occupation: retired  Tobacco Use   Smoking status: Former    Packs/day: 1.00    Years: 30.00    Additional pack years: 0.00    Total pack years: 30.00    Types: Cigarettes    Quit date: 08/01/2008    Years since quitting: 14.7   Smokeless tobacco: Never  Vaping Use   Vaping Use: Never used  Substance and Sexual Activity   Alcohol use: Not Currently    Alcohol/week: 1.0 standard drink of alcohol    Types: 1 Shots of liquor per week   Drug use: No   Sexual activity: Not on file  Other Topics Concern   Not on file  Social History Narrative   Not on file   Social Determinants of Health   Financial Resource Strain: Low Risk  (02/02/2023)   Overall Financial Resource Strain (CARDIA)    Difficulty of Paying Living Expenses: Not very hard  Food Insecurity: No Food Insecurity (02/02/2023)   Hunger Vital Sign    Worried About Running Out of Food in the Last Year: Never true    Ran Out of Food in the Last Year: Never true  Transportation Needs: No Transportation Needs (02/02/2023)   PRAPARE -  Administrator, Civil Service (Medical): No    Lack of Transportation (Non-Medical): No  Physical Activity: Insufficiently Active (02/02/2023)   Exercise Vital Sign    Days of Exercise per Week: 3 days    Minutes of Exercise per Session: 20 min  Stress: No Stress Concern Present (02/02/2023)   [employer] of Occupational Health - Occupational Stress Questionnaire    Feeling of Stress : Not at all  Social Connections: Unknown (02/02/2023)   Social Connection and Isolation Panel [NHANES]    Frequency of Communication with Friends and Family: More than three times a week    Frequency of Social Gatherings with Friends and Family: Once a week    Attends Religious Services: Not on Insurance claims handler of Clubs or  Organizations: No    Attends Banker Meetings: Never    Marital Status: Widowed  Intimate Partner Violence: Not At Risk (02/03/2023)   Humiliation, Afraid, Rape, and Kick questionnaire    Fear of Current or Ex-Partner: No    Emotionally Abused: No    Physically Abused: No    Sexually Abused: No   Family Status  Relation Name Status   Sister  Alive   Sister  Deceased at age 67   Father  Deceased at age 51   Brother  Alive   Mother  Deceased at age 16       carcinoma   Brother  Deceased   Brother  Armed forces training and education officer   Sister  (Not Specified)   MGM  (Not Specified)   Family History  Problem Relation Age of Onset   Cancer Sister        ovarian/uterine/breast   Breast cancer Sister 6   Cancer Sister        breast   Heart disease Sister    Breast cancer Sister 48   Heart disease Father    Cancer Father        liver   Pancreatic cancer Mother    Heart disease Brother    Heart attack Brother    Cancer Brother        liver   Heart disease Sister    Diabetes Maternal Grandmother    Allergies  Allergen Reactions   Levofloxacin Nausea Only and Nausea And Vomiting    Can take if given something for n/v   Vicodin [Hydrocodone-Acetaminophen] Nausea Only    Patient Care Team: Erasmo Downer, MD as PCP - General (Family Medicine) Lamar Blinks, MD as Consulting Physician (Cardiology) Jesusita Oka, MD as Consulting Physician (Dermatology) Yevonne Pax, MD as Consulting Physician (Internal Medicine) Antony Contras, MD as Consulting Physician (Ophthalmology) Tedd Sias, Marlana Salvage, MD as Physician Assistant (Endocrinology) Lonell Face, MD as Consulting Physician (Neurology) Fredderick Erb, MD as Attending Physician (Orthopedic Surgery)   Medications: Outpatient Medications Prior to Visit  Medication Sig   albuterol (ACCUNEB) 0.63 MG/3ML nebulizer solution Take 3 mLs (0.63 mg total) by nebulization every 6 (six) hours as needed for wheezing.    albuterol (VENTOLIN HFA) 108 (90 Base) MCG/ACT inhaler Inhale 2 puffs into the lungs every 6 (six) hours as needed for wheezing or shortness of breath.   alendronate (FOSAMAX) 70 MG tablet TAKE ONE TABLET BY MOUTH EACH WEEK, ON AN EMPTY STOMACH BEFORE BREAKFAST WITH 8oz OF WATER AND REMAIN UPRIGHT FOR :30   aspirin 81 MG EC tablet Take 81 mg by mouth daily with breakfast.   atorvastatin (LIPITOR) 40 MG tablet TAKE ONE  TABLET BY MOUTH EVERYDAY AT BEDTIME   azelastine (ASTELIN) 0.1 % nasal spray Place 2 sprays into both nostrils daily at 6 (six) AM.   betamethasone valerate (VALISONE) 0.1 % cream APPLY TOPICALLY TO AFFECTED AREA SPARINGLY 2 TIMES DAILY.   Budeson-Glycopyrrol-Formoterol (BREZTRI AEROSPHERE) 160-9-4.8 MCG/ACT AERO Inhale 2 puffs into the lungs 2 (two) times daily.   carbidopa-levodopa (SINEMET CR) 50-200 MG tablet Take 1 tablet by mouth at bedtime.   carbidopa-levodopa (SINEMET IR) 25-100 MG tablet Take 3 tablets with breakfast, 2 tablets with lunch, 2 tablets with supper.   cetirizine (ZYRTEC) 10 MG tablet TAKE 1 TABLET BY MOUTH ONCE DAILY   Cholecalciferol (VITAMIN D) 2000 UNITS CAPS Take 1 capsule by mouth daily.   citalopram (CELEXA) 20 MG tablet Take 1 tablet (20 mg total) by mouth daily.   clobetasol (TEMOVATE) 0.05 % external solution Apply 1 application topically 2 (two) times daily. As needed   clobetasol cream (TEMOVATE) 0.05 % Apply 1 application topically 2 (two) times daily. As needed   doxepin (SINEQUAN) 25 MG capsule Take 1 capsule (25 mg total) by mouth at bedtime.   ferrous sulfate 325 (65 FE) MG EC tablet Take 650 mg by mouth daily with breakfast.   ibuprofen (ADVIL) 200 MG tablet Take 200 mg by mouth every 6 (six) hours as needed.   ketoconazole (NIZORAL) 2 % shampoo Apply 1 application. topically 2 (two) times a week.   melatonin 5 MG TABS Take 1 tablet by mouth at bedtime.   montelukast (SINGULAIR) 10 MG tablet Take 1 tablet (10 mg total) by mouth every morning.    omeprazole (PRILOSEC) 40 MG capsule Take 1 capsule (40 mg total) by mouth at bedtime.   Sodium Sulfate-Mag Sulfate-KCl (SUTAB) 4804652325 MG TABS At 5 PM take 12 tablets using the 8 oz cup provided in the kit drinking 5 cups of water and 5 hours before your procedure repeat the same process.   No facility-administered medications prior to visit.    Review of Systems  Constitutional: Negative.   HENT: Negative.    Eyes: Negative.   Respiratory: Negative.    Cardiovascular: Negative.   Gastrointestinal: Negative.   Endocrine: Negative.   Genitourinary: Negative.   Musculoskeletal: Negative.   Skin: Negative.   Allergic/Immunologic: Negative.   Neurological: Negative.   Hematological: Negative.   Psychiatric/Behavioral: Negative.      {Labs  Heme  Chem  Endocrine  Serology  Results Review (optional):23779}  Objective    There were no vitals taken for this visit. {Show previous vital signs (optional):23777}   Physical Exam  ***  Last depression screening scores    02/03/2023    1:08 PM 12/22/2022    2:40 PM 10/26/2022    1:44 PM  PHQ 2/9 Scores  PHQ - 2 Score 0 0 0  PHQ- 9 Score 0 3 3   Last fall risk screening    02/02/2023    1:43 PM  Fall Risk   Falls in the past year? 0  Number falls in past yr: 0  Injury with Fall? 0  Risk for fall due to : No Fall Risks  Follow up Education provided;Falls prevention discussed   Last Audit-C alcohol use screening    02/02/2023    1:43 PM  Alcohol Use Disorder Test (AUDIT)  1. How often do you have a drink containing alcohol? 0   A score of 3 or more in women, and 4 or more in men indicates increased risk for alcohol abuse,  EXCEPT if all of the points are from question 1   No results found for any visits on 05/10/23.  Assessment & Plan    Routine Health Maintenance and Physical Exam  Exercise Activities and Dietary recommendations  Goals      DIET - EAT MORE FRUITS AND VEGETABLES     Improve Ability to  Self-Manage Chronic Conditions     Current Barriers:  Chronic Disease Management support and education needs related to Hypertension, COPD, Parkinson's and CAD. (Recent falls resulting in injury to both upper extremities)   Case Manager Clinical Goal(s):  Over the next 90 days, patient will not require unexpected hospital admission. Over the next 90 days, patient will take all medications as prescribed. Over the next 90 days, patient will notify provider if status changes and with worsening s/sx related to cardiac or respiratory complications. Over the next 90 days, patient will work with team to determine level of care concerns and possible need for additional assistance in the home. Over the next 90 days, patient will adhere to recommended safety measures to prevent additional injuries. Over the next 90 days, patient will attend all medical appointments as schedule.   Interventions:  Reviewed medications. Encouraged to take medications as prescribed. Family is currently assisting with medication preparation and administration due patient being unable to use her upper extremities. Denies concerns regarding prescription costs. Discussed worsening s/sx of complications related to cardiac and respiratory emergencies. Discussed indications for seeking immediate medical attention. Reports being unable to monitor BP or complete routine care d/t injuries to both upper extremities. . Reports productive cough with "clear to white" sputum. Denies shortness of breath or chest discomfort. Verbalized awareness of indications for notifying MD Reviewed safety measures and provider recommendations regarding mobility. Reports being primarily wheelchair bound since recent injuries. Reports previously using walker or assistive device. Currently has hard cast to left hand and brace to right elbow. Family members are currently assisting with ADLs. Agreeable to agencies being contacted to provide additional in-home  assistance if she qualifies. Reviewed pending appointments. Encouraged to attend appointments as scheduled to prevent delays in care. Reports son, Viviann Spare is currently providing transportation to appointments. Discussed plans for ongoing care management and follow up. Provided direct contact information for care management team.  Patient Self Care Activities: (Recent falls resulting in injuries to both upper extremities) Unable to self administer medications as prescribed Unable to perform ADLs independently Unable to perform IADLs independently  Initial goal documentation      Weight (lb) < 130 lb (59 kg)     Recommend eating 3 small meals with two healthy protein snacks in between.          Immunization History  Administered Date(s) Administered   Fluad Quad(high Dose 65+) 09/05/2019, 09/10/2021, 10/26/2022   Influenza Inj Mdck Quad Pf 07/23/2020   Influenza Split 10/28/2011, 08/10/2012   Influenza, High Dose Seasonal PF 08/21/2014, 08/29/2015   Influenza,inj,Quad PF,6+ Mos 08/16/2013   Influenza-Unspecified 08/16/2014, 08/11/2018, 09/10/2021   PFIZER(Purple Top)SARS-COV-2 Vaccination 02/01/2020, 02/27/2020   Pneumococcal Conjugate-13 01/10/2015   Pneumococcal Polysaccharide-23 05/29/2013   Tdap 04/01/2017   Zoster, Live 08/10/2012    Health Maintenance  Topic Date Due   Zoster Vaccines- Shingrix (1 of 2) Never done   Lung Cancer Screening  06/14/2015   COVID-19 Vaccine (3 - 2023-24 season) 07/17/2022   DEXA SCAN  01/15/2023   INFLUENZA VACCINE  06/17/2023   Medicare Annual Wellness (AWV)  02/03/2024   DTaP/Tdap/Td (2 - Td or Tdap)  04/02/2027   Colonoscopy  04/18/2033   Pneumonia Vaccine 51+ Years old  Completed   Hepatitis C Screening  Completed   HPV VACCINES  Aged Out    Discussed health benefits of physical activity, and encouraged her to engage in regular exercise appropriate for her age and condition.  ***  No follow-ups on file.     {provider  attestation***:1}   Shirlee Latch, MD  [facility] [phone] (phone) [phone] (fax)  [facility]

## 2023-05-13 NOTE — Procedures (Signed)
[facility] [address] [place], [address]    Complete Pulmonary Function Testing Interpretation:  FINDINGS:  Forced vital capacity is mildly decreased.  FEV1 is 1.15 L which is 62% of predicted and is moderately decreased.  F1 FVC ratio is decreased.  Postbronchodilator no significant change in FEV1 was noted.  Total lung capacity is normal residual limb is increased residual in the lung capacity ratio is increased.  FRC is increased.  DLCO was moderately decreased.  IMPRESSION:  This pulmonary function study is consistent with moderate obstructive lung disease.  Clinical correlation is recommended  Yevonne Pax, MD [facility] Pulmonary Critical Care Medicine Sleep Medicine

## 2023-05-17 LAB — PULMONARY FUNCTION TEST

## 2023-05-25 DIAGNOSIS — R040 Epistaxis: Secondary | ICD-10-CM | POA: Diagnosis not present

## 2023-05-25 DIAGNOSIS — H903 Sensorineural hearing loss, bilateral: Secondary | ICD-10-CM | POA: Diagnosis not present

## 2023-06-08 ENCOUNTER — Encounter: Payer: Self-pay | Admitting: Internal Medicine

## 2023-06-08 ENCOUNTER — Ambulatory Visit: Payer: Medicare PPO | Admitting: Gastroenterology

## 2023-06-08 ENCOUNTER — Other Ambulatory Visit: Payer: Self-pay

## 2023-06-08 DIAGNOSIS — D508 Other iron deficiency anemias: Secondary | ICD-10-CM

## 2023-06-08 DIAGNOSIS — K219 Gastro-esophageal reflux disease without esophagitis: Secondary | ICD-10-CM

## 2023-06-08 DIAGNOSIS — Z791 Long term (current) use of non-steroidal anti-inflammatories (NSAID): Secondary | ICD-10-CM

## 2023-06-08 MED ORDER — SUTAB 1479-225-188 MG PO TABS: ORAL_TABLET | ORAL | 0 refills | Status: DC

## 2023-06-08 MED ORDER — NA SULFATE-K SULFATE-MG SULF 17.5-3.13-1.6 GM/177ML PO SOLN: 354.0000 mL | Freq: Once | ORAL | 0 refills | Status: DC

## 2023-06-09 ENCOUNTER — Other Ambulatory Visit: Payer: Self-pay | Admitting: Family Medicine

## 2023-06-09 DIAGNOSIS — F419 Anxiety disorder, unspecified: Secondary | ICD-10-CM

## 2023-06-09 DIAGNOSIS — J45909 Unspecified asthma, uncomplicated: Secondary | ICD-10-CM

## 2023-06-14 ENCOUNTER — Ambulatory Visit: Payer: Medicare PPO | Admitting: Family Medicine

## 2023-06-14 ENCOUNTER — Encounter: Payer: Self-pay | Admitting: Family Medicine

## 2023-06-14 VITALS — BP 138/64 | HR 85 | Temp 98.7°F | Ht 61.0 in | Wt 124.0 lb

## 2023-06-14 DIAGNOSIS — Z0000 Encounter for general adult medical examination without abnormal findings: Secondary | ICD-10-CM

## 2023-06-14 DIAGNOSIS — Z23 Encounter for immunization: Secondary | ICD-10-CM

## 2023-06-14 DIAGNOSIS — I10 Essential (primary) hypertension: Secondary | ICD-10-CM

## 2023-06-14 DIAGNOSIS — D692 Other nonthrombocytopenic purpura: Secondary | ICD-10-CM

## 2023-06-14 DIAGNOSIS — Z1231 Encounter for screening mammogram for malignant neoplasm of breast: Secondary | ICD-10-CM

## 2023-06-14 DIAGNOSIS — E782 Mixed hyperlipidemia: Secondary | ICD-10-CM | POA: Diagnosis not present

## 2023-06-14 DIAGNOSIS — M810 Age-related osteoporosis without current pathological fracture: Secondary | ICD-10-CM

## 2023-06-14 DIAGNOSIS — R7303 Prediabetes: Secondary | ICD-10-CM

## 2023-06-14 NOTE — Progress Notes (Signed)
Annual Wellness Visit     Patient: Lori Brady, Female    DOB: 29-Sep-1946, 77 y.o.   MRN: [id number]  Subjective  No chief complaint on file.   Lori Brady is a 77 y.o. female who presents today for her Annual Wellness Visit. She reports consuming a general diet.  She generally feels well. She reports sleeping fairly well. She does not have additional problems to discuss today.   HPI  Discussed the use of AI scribe software for clinical note transcription with the patient, who gave verbal consent to proceed.  History of Present Illness   The patient, a 77 year old woman with a history of smoking, osteoporosis and Crohn's disease, presents for her wellness visit. She reports no specific complaints or symptoms. She mentions having a bone density scan in 2017 and is due for another one, as it has been more than five years since her last scan. She also mentions that she is due for a mammogram in October. She is scheduled for a colonoscopy in September, not for screening purposes, but due to concerns related to her Crohn's disease and blood loss. She has not had a lung cancer screening as she quit smoking in 2009. She is due for a flu shot and a COVID booster in the fall, and she expresses interest in receiving the new shingles shot, having previously received the old one.            Medications: Outpatient Medications Prior to Visit  Medication Sig   albuterol (ACCUNEB) 0.63 MG/3ML nebulizer solution Take 3 mLs (0.63 mg total) by nebulization every 6 (six) hours as needed for wheezing.   albuterol (VENTOLIN HFA) 108 (90 Base) MCG/ACT inhaler Inhale 2 puffs into the lungs every 6 (six) hours as needed for wheezing or shortness of breath.   alendronate (FOSAMAX) 70 MG tablet TAKE ONE TABLET BY MOUTH EACH WEEK, ON AN EMPTY STOMACH BEFORE BREAKFAST WITH 8oz OF WATER AND REMAIN UPRIGHT FOR :30   aspirin 81 MG EC tablet Take 81 mg by mouth daily with breakfast.   atorvastatin (LIPITOR)  40 MG tablet TAKE ONE TABLET BY MOUTH EVERYDAY AT BEDTIME   azelastine (ASTELIN) 0.1 % nasal spray Place 2 sprays into both nostrils daily at 6 (six) AM.   betamethasone valerate (VALISONE) 0.1 % cream APPLY TOPICALLY TO AFFECTED AREA SPARINGLY 2 TIMES DAILY.   Budeson-Glycopyrrol-Formoterol (BREZTRI AEROSPHERE) 160-9-4.8 MCG/ACT AERO Inhale 2 puffs into the lungs 2 (two) times daily.   carbidopa-levodopa (SINEMET CR) 50-200 MG tablet Take 1 tablet by mouth at bedtime.   carbidopa-levodopa (SINEMET IR) 25-100 MG tablet Take 3 tablets with breakfast, 2 tablets with lunch, 2 tablets with supper.   cetirizine (ZYRTEC) 10 MG tablet TAKE 1 TABLET BY MOUTH ONCE DAILY   Cholecalciferol (VITAMIN D) 2000 UNITS CAPS Take 1 capsule by mouth daily.   citalopram (CELEXA) 20 MG tablet Take 1 tablet (20 mg total) by mouth daily.   clobetasol (TEMOVATE) 0.05 % external solution Apply 1 application topically 2 (two) times daily. As needed   clobetasol cream (TEMOVATE) 0.05 % Apply 1 application topically 2 (two) times daily. As needed   doxepin (SINEQUAN) 25 MG capsule TAKE ONE CAPSULE BY MOUTH EVERYDAY AT BEDTIME   ferrous sulfate 325 (65 FE) MG EC tablet Take 650 mg by mouth daily with breakfast.   ketoconazole (NIZORAL) 2 % shampoo Apply 1 application. topically 2 (two) times a week.   melatonin 5 MG TABS Take 1 tablet by mouth  at bedtime.   montelukast (SINGULAIR) 10 MG tablet TAKE ONE TABLET BY MOUTH EVERY MORNING   omeprazole (PRILOSEC) 40 MG capsule Take 1 capsule (40 mg total) by mouth at bedtime.   Sodium Sulfate-Mag Sulfate-KCl (SUTAB) [id number] MG TABS At 5 PM take 12 tablets using the 8 oz cup provided in the kit drinking 5 cups of water and 5 hours before your procedure repeat the same process.   ibuprofen (ADVIL) 200 MG tablet Take 200 mg by mouth every 6 (six) hours as needed. (Patient not taking: Reported on 06/14/2023)   No facility-administered medications prior to visit.    Allergies   Allergen Reactions   Levofloxacin Nausea Only and Nausea And Vomiting    Can take if given something for n/v   Vicodin [Hydrocodone-Acetaminophen] Nausea Only    Patient Care Team: Erasmo Downer, MD as PCP - General (Family Medicine) Lamar Blinks, MD as Consulting Physician (Cardiology) Jesusita Oka, MD as Consulting Physician (Dermatology) Yevonne Pax, MD as Consulting Physician (Internal Medicine) Antony Contras, MD as Consulting Physician (Ophthalmology) Tedd Sias, Marlana Salvage, MD as Physician Assistant (Endocrinology) Lonell Face, MD as Consulting Physician (Neurology) Fredderick Erb, MD as Attending Physician (Orthopedic Surgery)  ROS per HPI      Objective  BP 138/64 (BP Location: Right Arm, Patient Position: Sitting, Cuff Size: Normal)   Pulse 85   Temp 98.7 F (37.1 C)   Ht 5\' 1"  (1.549 m)   Wt 124 lb (56.2 kg)   SpO2 95%   BMI 23.43 kg/m    Physical Exam Vitals reviewed.  Constitutional:      General: She is not in acute distress.    Appearance: Normal appearance. She is well-developed. She is not diaphoretic.  HENT:     Head: Normocephalic and atraumatic.     Right Ear: Tympanic membrane, ear canal and external ear normal.     Left Ear: Tympanic membrane, ear canal and external ear normal.     Nose: Nose normal.     Mouth/Throat:     Mouth: Mucous membranes are moist.     Pharynx: Oropharynx is clear. No oropharyngeal exudate.  Eyes:     General: No scleral icterus.    Conjunctiva/sclera: Conjunctivae normal.     Pupils: Pupils are equal, round, and reactive to light.  Neck:     Thyroid: No thyromegaly.  Cardiovascular:     Rate and Rhythm: Normal rate and regular rhythm.     Heart sounds: Normal heart sounds. No murmur heard. Pulmonary:     Effort: Pulmonary effort is normal. No respiratory distress.     Breath sounds: Normal breath sounds. No wheezing or rales.  Abdominal:     General: There is no distension.      Palpations: Abdomen is soft.     Tenderness: There is no abdominal tenderness.  Musculoskeletal:        General: No deformity.     Cervical back: Neck supple.     Right lower leg: No edema.     Left lower leg: No edema.  Lymphadenopathy:     Cervical: No cervical adenopathy.  Skin:    General: Skin is warm and dry.     Findings: No rash.  Neurological:     Mental Status: She is alert and oriented to person, place, and time. Mental status is at baseline.  Psychiatric:        Mood and Affect: Mood normal.  Behavior: Behavior normal.        Thought Content: Thought content normal.       Most recent functional status assessment:    06/14/2023    1:24 PM  In your present state of health, do you have any difficulty performing the following activities:  Hearing? 1  Vision? 0  Difficulty concentrating or making decisions? 0  Walking or climbing stairs? 1  Dressing or bathing? 0  Doing errands, shopping? 1   Most recent fall risk assessment:    06/14/2023    1:23 PM  Fall Risk   Falls in the past year? 0  Number falls in past yr: 0  Injury with Fall? 0    Most recent depression screenings:    06/14/2023    1:23 PM 02/03/2023    1:08 PM  PHQ 2/9 Scores  PHQ - 2 Score 0 0  PHQ- 9 Score  0   Most recent cognitive screening:    02/03/2023    1:14 PM  6CIT Screen  What Year? 0 points  What month? 0 points  What time? 0 points  Count back from 20 0 points  Months in reverse 0 points  Repeat phrase 0 points  Total Score 0 points   Most recent Audit-C alcohol use screening    06/14/2023    1:24 PM  Alcohol Use Disorder Test (AUDIT)  1. How often do you have a drink containing alcohol? 0  3. How often do you have six or more drinks on one occasion? 0   A score of 3 or more in women, and 4 or more in men indicates increased risk for alcohol abuse, EXCEPT if all of the points are from question 1   Vision/Hearing Screen: No results found.    No results  found for any visits on 06/14/23.    Assessment & Plan   Annual wellness visit done today including the all of the following: Reviewed patient's Family Medical History Reviewed and updated list of patient's medical providers Assessment of cognitive impairment was done Assessed patient's functional ability Established a written schedule for health screening services Health Risk Assessent Completed and Reviewed  Exercise Activities and Dietary recommendations  Goals      DIET - EAT MORE FRUITS AND VEGETABLES     Improve Ability to Self-Manage Chronic Conditions     Current Barriers:  Chronic Disease Management support and education needs related to Hypertension, COPD, Parkinson's and CAD. (Recent falls resulting in injury to both upper extremities)   Case Manager Clinical Goal(s):  Over the next 90 days, patient will not require unexpected hospital admission. Over the next 90 days, patient will take all medications as prescribed. Over the next 90 days, patient will notify provider if status changes and with worsening s/sx related to cardiac or respiratory complications. Over the next 90 days, patient will work with team to determine level of care concerns and possible need for additional assistance in the home. Over the next 90 days, patient will adhere to recommended safety measures to prevent additional injuries. Over the next 90 days, patient will attend all medical appointments as schedule.   Interventions:  Reviewed medications. Encouraged to take medications as prescribed. Family is currently assisting with medication preparation and administration due patient being unable to use her upper extremities. Denies concerns regarding prescription costs. Discussed worsening s/sx of complications related to cardiac and respiratory emergencies. Discussed indications for seeking immediate medical attention. Reports being unable to monitor BP or complete routine  care d/t injuries to both  upper extremities. . Reports productive cough with "clear to white" sputum. Denies shortness of breath or chest discomfort. Verbalized awareness of indications for notifying MD Reviewed safety measures and provider recommendations regarding mobility. Reports being primarily wheelchair bound since recent injuries. Reports previously using walker or assistive device. Currently has hard cast to left hand and brace to right elbow. Family members are currently assisting with ADLs. Agreeable to agencies being contacted to provide additional in-home assistance if she qualifies. Reviewed pending appointments. Encouraged to attend appointments as scheduled to prevent delays in care. Reports son, Viviann Spare is currently providing transportation to appointments. Discussed plans for ongoing care management and follow up. Provided direct contact information for care management team.  Patient Self Care Activities: (Recent falls resulting in injuries to both upper extremities) Unable to self administer medications as prescribed Unable to perform ADLs independently Unable to perform IADLs independently  Initial goal documentation      Weight (lb) < 130 lb (59 kg)     Recommend eating 3 small meals with two healthy protein snacks in between.         Immunization History  Administered Date(s) Administered   Fluad Quad(high Dose 65+) 09/05/2019, 09/10/2021, 10/26/2022   Influenza Inj Mdck Quad Pf 07/23/2020   Influenza Split 10/28/2011, 08/10/2012   Influenza, High Dose Seasonal PF 08/21/2014, 08/29/2015   Influenza,inj,Quad PF,6+ Mos 08/16/2013   Influenza-Unspecified 08/16/2014, 08/11/2018, 09/10/2021   PFIZER(Purple Top)SARS-COV-2 Vaccination 02/01/2020, 02/27/2020   Pneumococcal Conjugate-13 01/10/2015   Pneumococcal Polysaccharide-23 05/29/2013   Tdap 04/01/2017   Zoster, Live 08/10/2012    Health Maintenance  Topic Date Due   Zoster Vaccines- Shingrix (1 of 2) 03/12/1998   Lung Cancer Screening   06/14/2015   COVID-19 Vaccine (3 - 2023-24 season) 07/17/2022   DEXA SCAN  01/15/2023   INFLUENZA VACCINE  06/17/2023   Medicare Annual Wellness (AWV)  06/13/2024   DTaP/Tdap/Td (2 - Td or Tdap) 04/02/2027   Colonoscopy  04/18/2033   Pneumonia Vaccine 29+ Years old  Completed   Hepatitis C Screening  Completed   HPV VACCINES  Aged Out     Discussed health benefits of physical activity, and encouraged her to engage in regular exercise appropriate for her age and condition.    Problem List Items Addressed This Visit       Cardiovascular and Mediastinum   Benign essential HTN   Senile purpura (HCC)     Musculoskeletal and Integument   Osteoporosis   Relevant Orders   DG Bone Density     Other   Combined fat and carbohydrate induced hyperlipemia   Relevant Orders   Lipid panel   Other Visit Diagnoses     Encounter for annual wellness visit (AWV) in Medicare patient    -  Primary   Encounter for annual physical exam       Relevant Orders   Lipid panel   Hemoglobin A1c   MM 3D SCREENING MAMMOGRAM BILATERAL BREAST   DG Bone Density   Prediabetes       Relevant Orders   Hemoglobin A1c   Breast cancer screening by mammogram       Relevant Orders   MM 3D SCREENING MAMMOGRAM BILATERAL BREAST           Osteoporosis: Last bone density scan was in 2017. Recommended to continue scans every two years. -Schedule bone density scan in conjunction with mammogram in October 2024.  Breast Cancer Screening: Patient is due for mammogram  in October 2024. -Schedule mammogram in October 2024.  Influenza Vaccination: Patient is due for annual influenza vaccine in the fall. -Administer influenza vaccine in late September or early October 2024.  COVID-19 Vaccination: Patient is due for COVID-19 booster in the fall. -Administer COVID-19 booster in late September or early October 2024.  Shingles Vaccination: Patient has received the old shingles vaccine but not the new  one. -Administer first dose of new shingles vaccine today. -Schedule second dose of new shingles vaccine 2-94m from now.  Blood Sugar and Cholesterol: Patient has not had recent A1c or cholesterol checks. -Order labs for A1c and cholesterol today.  Follow-up: Regular follow-up in six months. -Schedule follow-up appointment for January 2025.        Return in about 2 months (around 08/15/2023) for shingrix #2 and flu and 8m for chronic f/u.     Shirlee Latch, MD

## 2023-06-21 ENCOUNTER — Other Ambulatory Visit: Payer: Self-pay | Admitting: Family Medicine

## 2023-06-21 DIAGNOSIS — E7800 Pure hypercholesterolemia, unspecified: Secondary | ICD-10-CM

## 2023-06-30 DIAGNOSIS — R040 Epistaxis: Secondary | ICD-10-CM | POA: Diagnosis not present

## 2023-07-07 ENCOUNTER — Other Ambulatory Visit: Payer: Self-pay

## 2023-07-07 ENCOUNTER — Telehealth: Payer: Self-pay | Admitting: Pharmacist

## 2023-07-07 ENCOUNTER — Other Ambulatory Visit: Payer: Medicare PPO | Admitting: Pharmacist

## 2023-07-07 ENCOUNTER — Other Ambulatory Visit (HOSPITAL_COMMUNITY): Payer: Self-pay

## 2023-07-07 ENCOUNTER — Encounter: Payer: Self-pay | Admitting: Internal Medicine

## 2023-07-07 ENCOUNTER — Telehealth: Payer: Self-pay

## 2023-07-07 DIAGNOSIS — J301 Allergic rhinitis due to pollen: Secondary | ICD-10-CM

## 2023-07-07 DIAGNOSIS — F419 Anxiety disorder, unspecified: Secondary | ICD-10-CM

## 2023-07-07 DIAGNOSIS — J4489 Other specified chronic obstructive pulmonary disease: Secondary | ICD-10-CM

## 2023-07-07 DIAGNOSIS — R0602 Shortness of breath: Secondary | ICD-10-CM

## 2023-07-07 DIAGNOSIS — M810 Age-related osteoporosis without current pathological fracture: Secondary | ICD-10-CM

## 2023-07-07 DIAGNOSIS — E7800 Pure hypercholesterolemia, unspecified: Secondary | ICD-10-CM

## 2023-07-07 DIAGNOSIS — J45909 Unspecified asthma, uncomplicated: Secondary | ICD-10-CM

## 2023-07-07 MED ORDER — ALBUTEROL SULFATE HFA 108 (90 BASE) MCG/ACT IN AERS
2.0000 | INHALATION_SPRAY | Freq: Four times a day (QID) | RESPIRATORY_TRACT | 1 refills | Status: DC | PRN
Start: 2023-07-07 — End: 2023-09-06
  Filled 2023-07-07: qty 18, 25d supply, fill #0
  Filled 2023-07-16: qty 18, 30d supply, fill #0
  Filled 2023-08-11 (×2): qty 18, 30d supply, fill #1

## 2023-07-07 MED ORDER — CETIRIZINE HCL 10 MG PO TABS
10.0000 mg | ORAL_TABLET | Freq: Every day | ORAL | 1 refills | Status: DC
  Filled 2023-07-07 – 2023-07-16 (×2): qty 30, 30d supply, fill #0
  Filled 2023-08-11: qty 30, 30d supply, fill #1

## 2023-07-07 MED ORDER — ALBUTEROL SULFATE 0.63 MG/3ML IN NEBU
1.0000 | INHALATION_SOLUTION | Freq: Four times a day (QID) | RESPIRATORY_TRACT | 1 refills | Status: DC | PRN
  Filled 2023-07-07: qty 360, 30d supply, fill #0

## 2023-07-07 MED ORDER — CARBIDOPA-LEVODOPA 25-100 MG PO TABS
ORAL_TABLET | ORAL | 11 refills | Status: DC
  Filled 2023-07-07: qty 630, 90d supply, fill #0
  Filled 2023-07-16: qty 210, 30d supply, fill #0
  Filled 2023-08-11 (×2): qty 210, 30d supply, fill #1
  Filled 2023-09-06: qty 210, 30d supply, fill #2
  Filled 2023-10-07: qty 210, 30d supply, fill #3
  Filled 2023-11-03: qty 210, 30d supply, fill #4
  Filled 2023-11-29: qty 210, 30d supply, fill #5
  Filled 2023-12-28: qty 210, 30d supply, fill #6
  Filled 2024-01-11 – 2024-01-20 (×2): qty 210, 30d supply, fill #7
  Filled 2024-01-28 – 2024-02-16 (×2): qty 210, 30d supply, fill #8
  Filled 2024-03-06 – 2024-03-14 (×2): qty 210, 30d supply, fill #9
  Filled 2024-03-31 – 2024-04-11 (×2): qty 210, 30d supply, fill #10
  Filled 2024-05-12 – 2024-05-23 (×2): qty 210, 30d supply, fill #11

## 2023-07-07 MED ORDER — CARBIDOPA-LEVODOPA ER 50-200 MG PO TBCR
1.0000 | EXTENDED_RELEASE_TABLET | Freq: Every day | ORAL | 3 refills | Status: DC
  Filled 2023-07-07: qty 90, 90d supply, fill #0
  Filled 2023-07-16: qty 30, 30d supply, fill #0
  Filled 2023-08-11: qty 30, 30d supply, fill #1
  Filled 2023-09-06: qty 30, 30d supply, fill #2
  Filled 2023-10-13 (×2): qty 30, 30d supply, fill #3
  Filled 2023-11-04 – 2023-11-08 (×3): qty 30, 30d supply, fill #4
  Filled 2023-11-22 – 2023-12-01 (×2): qty 30, 30d supply, fill #5
  Filled 2023-12-28: qty 30, 30d supply, fill #6
  Filled 2024-01-11 – 2024-01-20 (×2): qty 30, 30d supply, fill #7
  Filled 2024-01-28 – 2024-02-16 (×2): qty 30, 30d supply, fill #8
  Filled 2024-03-06 – 2024-03-14 (×2): qty 30, 30d supply, fill #9
  Filled 2024-03-31 – 2024-04-11 (×2): qty 30, 30d supply, fill #10
  Filled 2024-05-23: qty 30, 30d supply, fill #11

## 2023-07-07 NOTE — Patient Instructions (Signed)
Goals Addressed             This Visit's Progress    Pharmacy Goals       Please follow up with [facility] at [phone] to discuss getting setup for their adherence packaging.   Our next telephone call is scheduled for 07/21/2023 at 4 pm, but please reach out if you have medication-related questions or concerns sooner.  Thank you!  Estelle Grumbles, PharmD, [facility] [phone]

## 2023-07-07 NOTE — Progress Notes (Signed)
07/07/2023 Name: Lori Brady MRN: [id number] DOB: 08-Sep-1946  Chief Complaint  Patient presents with   Medication Management    Lori Brady is a 77 y.o. year old female who presented for a telephone visit.   They were referred to the pharmacist by their PCP for assistance in managing complex medication management.    Subjective:  Care Team: Primary Care Provider: Erasmo Downer, MD ; Next Scheduled Visit: 08/17/2023 Neurologist: Jolene Provost, MD; Next Scheduled Visit: 11/02/2023 Gastroenterologist: Wyline Mood, MD; Procedure Visit: 07/20/2023 Pulmonologist: Yevonne Pax, MD; Next Scheduled Visit: 10/11/2023 Hematologist: Earna Coder, MD; Next Scheduled Visit: 09/22/2023  Medication Access/Adherence  Current Pharmacy:  [facility] [place] - [address] [place] [address] Phone: [phone] Fax: [phone]  [facility] - [place], [place] - [address] [place] [address] Phone: [phone] Fax: [phone]   Patient reports affordability concerns with their medications: No  Patient reports access/transportation concerns to their pharmacy: No  Patient reports adherence concerns with their medications:  No    Patient reports that she has been receiving medications primarily from [facility], but also [facility]. Patient would like to switch to an alternative pharmacy that offers pill packaging as [facility] is closing as of 07/23/2023  Reports that she manages her own medications  Objective:  Lab Results  Component Value Date   CREATININE 0.52 03/22/2023   BUN 17 03/22/2023   NA 139 03/22/2023   K 3.7 03/22/2023   CL 102 03/22/2023   CO2 26 03/22/2023    Lab Results  Component Value Date   CHOL 137 06/14/2023   HDL 39 (L) 06/14/2023   LDLCALC 70 06/14/2023   TRIG 166 (H) 06/14/2023   CHOLHDL 3.5 06/14/2023    Medications  Reviewed Today     Reviewed by Manuela Neptune, RPH-CPP (Pharmacist) on 07/07/23 at 1124  Med List Status: <None>   Medication Order Taking? Sig Documenting Provider Last Dose Status Informant  albuterol (ACCUNEB) 0.63 MG/3ML nebulizer solution [id number] Yes Take 3 mLs (0.63 mg total) by nebulization every 6 (six) hours as needed for wheezing. Yevonne Pax, MD Taking Active   albuterol (VENTOLIN HFA) 108 (90 Base) MCG/ACT inhaler [id number] Yes Inhale 2 puffs into the lungs every 6 (six) hours as needed for wheezing or shortness of breath. Sallyanne Kuster, NP Taking Active   alendronate (FOSAMAX) 70 MG tablet [id number] Yes TAKE ONE TABLET BY MOUTH EACH WEEK, ON AN EMPTY STOMACH BEFORE BREAKFAST WITH 8oz OF WATER AND REMAIN UPRIGHT FOR :30 Bacigalupo, Marzella Schlein, MD Taking Active   aspirin 81 MG EC tablet [id number] Yes Take 81 mg by mouth daily with breakfast. [provider] Taking Active   atorvastatin (LIPITOR) 40 MG tablet [id number] Yes TAKE ONE TABLET BY MOUTH EVERYDAY AT BEDTIME Erasmo Downer, MD Taking Active   azelastine (ASTELIN) 0.1 % nasal spray [id number] Yes Place 2 sprays into both nostrils daily at 6 (six) AM. [provider] Taking Active   Budeson-Glycopyrrol-Formoterol (BREZTRI AEROSPHERE) 160-9-4.8 MCG/ACT AERO [id number] Yes Inhale 2 puffs into the lungs 2 (two) times daily. Erasmo Downer, MD Taking Active   carbidopa-levodopa (SINEMET CR) 50-200 MG tablet [id number] Yes Take 1 tablet by mouth at bedtime. [provider] Taking Active   carbidopa-levodopa (SINEMET IR) 25-100 MG tablet [id number] Yes Take 3 tablets with breakfast, 2 tablets with lunch, 2 tablets with supper.  [provider] Taking Active   cetirizine (ZYRTEC) 10 MG tablet [id number] Yes TAKE 1 TABLET BY MOUTH ONCE DAILY Yevonne Pax, MD Taking Active   Cholecalciferol (VITAMIN D) 2000 UNITS CAPS [id number] Yes Take 1 capsule by mouth daily. [provider]  Taking Active Self  citalopram (CELEXA) 20 MG tablet [id number] Yes Take 1 tablet (20 mg total) by mouth daily. Erasmo Downer, MD Taking Active   doxepin [facility]) 25 MG capsule [id number] Yes TAKE ONE CAPSULE BY MOUTH EVERYDAY AT BEDTIME Bacigalupo, Marzella Schlein, MD Taking Active   ferrous sulfate 325 (65 FE) MG EC tablet [id number] Yes Take 650 mg by mouth daily with breakfast. [provider] Taking Active   melatonin 5 MG TABS [id number] Yes Take 1 tablet by mouth at bedtime. [provider] Taking Active   montelukast (SINGULAIR) 10 MG tablet [id number] Yes TAKE ONE TABLET BY MOUTH EVERY MORNING Bacigalupo, Marzella Schlein, MD Taking Active   omeprazole (PRILOSEC) 40 MG capsule [id number] Yes Take 1 capsule (40 mg total) by mouth at bedtime. Wyline Mood, MD Taking Active   Sodium Sulfate-Mag Sulfate-KCl (SUTAB) 541 023 9497 MG TABS [id number]  At 5 PM take 12 tablets using the 8 oz cup provided in the kit drinking 5 cups of water and 5 hours before your procedure repeat the same process. Wyline Mood, MD  Active               Assessment/Plan:   Comprehensive medication review performed; medication list updated in electronic medical record - Patient confirms taking alendronate first thing in the morning ?30 minutes before the first food, beverage (except plain water), or other medications of the day - Counsel patient on trying to separate doses of ferrous sulfate from carbidopa-levodopa by ~2 hours as able as taking with iron may decrease serum concentration of levodopa - Patient admits to chronic dry mouth. Note patient takes both doxepin and uses Breztri inhaler (has anticholingeric component) and these anticholinergic agents can lead to side effects including dry mouth Patient to discuss her chronic dry mouth with PCP at upcoming appointment, Reports taking doxepin nightly related to nerve symptoms, but will discuss whether to consider alternative management options  Discuss  with patient other pharmacies in the area that offer adherence packaging. Patient would like to switch to [facility].  - Reports that she currently has enough of her medications to last until 07/25/2023 - Provide patient with contact information for [facility] [phone])  Plan:  1) Collaborate with PCP and specialist providers to request providers send prescriptions for the active medications to [facility] - Send telephone messages to PCP and GI Specialist - Outreach to Neurology and Pulmonology offices by telephone to leave messages with this request   2) Patient to follow up with [facility]   Follow Up Plan: Clinical Pharmacist will outreach to patient by telephone on 07/21/2023 at 4 pm   Estelle Grumbles, PharmD, [facility] [phone]

## 2023-07-07 NOTE — Telephone Encounter (Unsigned)
Copied from CRM [id number]. Topic: General - Other >> Jul 06, 2023  1:01 PM Macon Large wrote: Reason for CRM: Pt request that Trinna Post return her call at [phone]

## 2023-07-07 NOTE — Telephone Encounter (Signed)
Maritza please send scripts

## 2023-07-07 NOTE — Progress Notes (Unsigned)
Dr. Tobi Bastos,  Patient's current pharmacy ([facility]) is closing as of 9/6. She would like to change pharmacies to [facility] for adherence packaging.   Would you mind sending new prescription for omeprazole for her to [facility] If you could please include a note to pharmacy that prescription is for adherence packaging  Thank you!  Estelle Grumbles, PharmD, [facility] [phone]

## 2023-07-08 ENCOUNTER — Other Ambulatory Visit: Payer: Self-pay

## 2023-07-08 ENCOUNTER — Other Ambulatory Visit (HOSPITAL_COMMUNITY): Payer: Self-pay

## 2023-07-08 DIAGNOSIS — K219 Gastro-esophageal reflux disease without esophagitis: Secondary | ICD-10-CM

## 2023-07-08 MED ORDER — ALENDRONATE SODIUM 70 MG PO TABS
70.0000 mg | ORAL_TABLET | ORAL | 1 refills | Status: DC
  Filled 2023-07-08: qty 12, 84d supply, fill #0
  Filled 2023-07-16: qty 4, 28d supply, fill #0
  Filled 2023-08-11 (×2): qty 4, 28d supply, fill #1
  Filled 2023-09-10: qty 4, 28d supply, fill #2
  Filled 2023-10-08: qty 4, 28d supply, fill #3
  Filled 2023-10-29: qty 4, 28d supply, fill #4
  Filled 2023-11-26: qty 4, 28d supply, fill #5

## 2023-07-08 MED ORDER — DOXEPIN HCL 25 MG PO CAPS
25.0000 mg | ORAL_CAPSULE | Freq: Every day | ORAL | 1 refills | Status: DC
Start: 2023-07-08 — End: 2023-09-10
  Filled 2023-07-08: qty 90, 90d supply, fill #0
  Filled 2023-07-16: qty 30, 30d supply, fill #0
  Filled 2023-08-11: qty 30, 30d supply, fill #1
  Filled 2023-09-06: qty 30, 30d supply, fill #2

## 2023-07-08 MED ORDER — ATORVASTATIN CALCIUM 40 MG PO TABS
40.0000 mg | ORAL_TABLET | Freq: Every day | ORAL | 1 refills | Status: DC
Start: 2023-07-08 — End: 2023-12-21
  Filled 2023-07-08: qty 90, 90d supply, fill #0
  Filled 2023-07-16: qty 30, 30d supply, fill #0
  Filled 2023-08-11: qty 30, 30d supply, fill #1
  Filled 2023-09-06: qty 30, 30d supply, fill #2
  Filled 2023-10-11: qty 30, 30d supply, fill #3
  Filled 2023-11-03 – 2023-11-08 (×3): qty 30, 30d supply, fill #4
  Filled 2023-11-22 – 2023-12-01 (×2): qty 30, 30d supply, fill #5

## 2023-07-08 MED ORDER — CITALOPRAM HYDROBROMIDE 20 MG PO TABS
20.0000 mg | ORAL_TABLET | Freq: Every day | ORAL | 1 refills | Status: DC
  Filled 2023-07-08: qty 90, 90d supply, fill #0
  Filled 2023-07-16: qty 30, 30d supply, fill #0
  Filled 2023-08-11: qty 30, 30d supply, fill #1
  Filled 2023-09-06: qty 30, 30d supply, fill #2
  Filled 2023-10-11: qty 30, 30d supply, fill #3
  Filled 2023-11-03 – 2023-11-08 (×3): qty 30, 30d supply, fill #4
  Filled 2023-11-22 – 2023-12-01 (×2): qty 30, 30d supply, fill #5

## 2023-07-08 MED ORDER — OMEPRAZOLE 40 MG PO CPDR
40.0000 mg | DELAYED_RELEASE_CAPSULE | Freq: Every day | ORAL | 3 refills | Status: DC
Start: 2023-07-08 — End: 2023-10-11
  Filled 2023-07-08: qty 90, 90d supply, fill #0
  Filled 2023-07-16: qty 30, 30d supply, fill #0
  Filled 2023-08-11: qty 30, 30d supply, fill #1
  Filled 2023-09-06: qty 30, 30d supply, fill #2
  Filled 2023-10-11: qty 30, 30d supply, fill #3

## 2023-07-08 MED ORDER — MONTELUKAST SODIUM 10 MG PO TABS
10.0000 mg | ORAL_TABLET | Freq: Every morning | ORAL | 1 refills | Status: DC
Start: 2023-07-08 — End: 2023-12-21
  Filled 2023-07-08: qty 90, 90d supply, fill #0
  Filled 2023-07-16: qty 30, 30d supply, fill #0
  Filled 2023-08-11: qty 30, 30d supply, fill #1
  Filled 2023-09-06: qty 30, 30d supply, fill #2
  Filled 2023-10-11: qty 30, 30d supply, fill #3
  Filled 2023-11-03 – 2023-11-08 (×3): qty 30, 30d supply, fill #4
  Filled 2023-11-22 – 2023-12-01 (×2): qty 30, 30d supply, fill #5

## 2023-07-08 MED ORDER — BREZTRI AEROSPHERE 160-9-4.8 MCG/ACT IN AERO
2.0000 | INHALATION_SPRAY | Freq: Two times a day (BID) | RESPIRATORY_TRACT | 11 refills | Status: DC
  Filled 2023-07-08 – 2023-07-16 (×2): qty 10.7, 30d supply, fill #0
  Filled 2023-08-11 (×2): qty 10.7, 30d supply, fill #1

## 2023-07-08 NOTE — Telephone Encounter (Signed)
Prescriptions sent to [facility].

## 2023-07-08 NOTE — Telephone Encounter (Signed)
Ok to resend all Rxs to [facility]. Please make note for adherence packaging as above. Can send me Rx requests for any things thaat don't fall under protocol.

## 2023-07-08 NOTE — Telephone Encounter (Signed)
Prescription sent to [facility].

## 2023-07-13 ENCOUNTER — Other Ambulatory Visit (HOSPITAL_COMMUNITY): Payer: Self-pay

## 2023-07-13 MED ORDER — ALBUTEROL SULFATE 0.63 MG/3ML IN NEBU: INHALATION_SOLUTION | RESPIRATORY_TRACT | 1 refills | Status: DC

## 2023-07-13 MED ORDER — TRELEGY ELLIPTA 100-62.5-25 MCG/ACT IN AEPB
1.0000 | INHALATION_SPRAY | Freq: Every day | RESPIRATORY_TRACT | 11 refills | Status: DC
  Filled 2023-08-12: qty 60, 30d supply, fill #0

## 2023-07-13 MED ORDER — SODIUM FLUORIDE 1.1 % DT GEL
DENTAL | 5 refills | Status: AC
  Filled 2023-08-12: qty 100, 30d supply, fill #0
  Filled 2023-09-06: qty 100, 30d supply, fill #1
  Filled 2023-10-05: qty 100, 30d supply, fill #2
  Filled 2023-11-04: qty 100, 30d supply, fill #3

## 2023-07-14 ENCOUNTER — Other Ambulatory Visit (HOSPITAL_COMMUNITY): Payer: Self-pay

## 2023-07-16 ENCOUNTER — Other Ambulatory Visit: Payer: Self-pay

## 2023-07-16 ENCOUNTER — Encounter: Payer: Self-pay | Admitting: Gastroenterology

## 2023-07-16 ENCOUNTER — Other Ambulatory Visit (HOSPITAL_COMMUNITY): Payer: Self-pay

## 2023-07-16 ENCOUNTER — Encounter: Payer: Self-pay | Admitting: Internal Medicine

## 2023-07-20 ENCOUNTER — Ambulatory Visit: Payer: Medicare PPO | Admitting: Anesthesiology

## 2023-07-20 ENCOUNTER — Other Ambulatory Visit: Payer: Self-pay

## 2023-07-20 ENCOUNTER — Ambulatory Visit
Admission: RE | Admit: 2023-07-20 | Discharge: 2023-07-20 | Disposition: A | Discharge status: Home / Self Care | Payer: Medicare PPO | Attending: Gastroenterology | Admitting: Gastroenterology

## 2023-07-20 ENCOUNTER — Encounter
Admission: RE | Disposition: A | Discharge status: Home / Self Care | Payer: Self-pay | Source: Home / Self Care | Attending: Gastroenterology

## 2023-07-20 DIAGNOSIS — Z87891 Personal history of nicotine dependence: Secondary | ICD-10-CM | POA: Diagnosis not present

## 2023-07-20 DIAGNOSIS — I252 Old myocardial infarction: Secondary | ICD-10-CM | POA: Insufficient documentation

## 2023-07-20 DIAGNOSIS — K219 Gastro-esophageal reflux disease without esophagitis: Secondary | ICD-10-CM | POA: Diagnosis not present

## 2023-07-20 DIAGNOSIS — I2510 Atherosclerotic heart disease of native coronary artery without angina pectoris: Secondary | ICD-10-CM | POA: Diagnosis not present

## 2023-07-20 DIAGNOSIS — G20A1 Parkinson's disease without dyskinesia, without mention of fluctuations: Secondary | ICD-10-CM | POA: Diagnosis not present

## 2023-07-20 DIAGNOSIS — K5520 Angiodysplasia of colon without hemorrhage: Secondary | ICD-10-CM | POA: Diagnosis not present

## 2023-07-20 DIAGNOSIS — K319 Disease of stomach and duodenum, unspecified: Secondary | ICD-10-CM | POA: Diagnosis not present

## 2023-07-20 DIAGNOSIS — K449 Diaphragmatic hernia without obstruction or gangrene: Secondary | ICD-10-CM | POA: Insufficient documentation

## 2023-07-20 DIAGNOSIS — K2970 Gastritis, unspecified, without bleeding: Secondary | ICD-10-CM | POA: Insufficient documentation

## 2023-07-20 DIAGNOSIS — J439 Emphysema, unspecified: Secondary | ICD-10-CM | POA: Insufficient documentation

## 2023-07-20 DIAGNOSIS — D124 Benign neoplasm of descending colon: Secondary | ICD-10-CM | POA: Diagnosis not present

## 2023-07-20 DIAGNOSIS — K3189 Other diseases of stomach and duodenum: Secondary | ICD-10-CM

## 2023-07-20 DIAGNOSIS — K5090 Crohn's disease, unspecified, without complications: Secondary | ICD-10-CM | POA: Insufficient documentation

## 2023-07-20 DIAGNOSIS — D649 Anemia, unspecified: Secondary | ICD-10-CM | POA: Diagnosis present

## 2023-07-20 DIAGNOSIS — I10 Essential (primary) hypertension: Secondary | ICD-10-CM | POA: Diagnosis not present

## 2023-07-20 DIAGNOSIS — D508 Other iron deficiency anemias: Secondary | ICD-10-CM

## 2023-07-20 DIAGNOSIS — K635 Polyp of colon: Secondary | ICD-10-CM | POA: Diagnosis not present

## 2023-07-20 DIAGNOSIS — Z791 Long term (current) use of non-steroidal anti-inflammatories (NSAID): Secondary | ICD-10-CM

## 2023-07-20 DIAGNOSIS — D126 Benign neoplasm of colon, unspecified: Secondary | ICD-10-CM

## 2023-07-20 DIAGNOSIS — D509 Iron deficiency anemia, unspecified: Secondary | ICD-10-CM | POA: Diagnosis not present

## 2023-07-20 HISTORY — PX: COLONOSCOPY WITH PROPOFOL: SHX5780

## 2023-07-20 HISTORY — PX: POLYPECTOMY: SHX5525

## 2023-07-20 HISTORY — PX: HOT HEMOSTASIS: SHX5433

## 2023-07-20 HISTORY — PX: ESOPHAGOGASTRODUODENOSCOPY (EGD) WITH PROPOFOL: SHX5813

## 2023-07-20 HISTORY — PX: BIOPSY: SHX5522

## 2023-07-20 SURGERY — COLONOSCOPY WITH PROPOFOL
Anesthesia: General

## 2023-07-20 MED ORDER — LIDOCAINE HCL (PF) 2 % IJ SOLN
INTRAMUSCULAR | Status: AC
  Filled 2023-07-20: qty 5

## 2023-07-20 MED ORDER — PROPOFOL 10 MG/ML IV BOLUS
INTRAVENOUS | Status: AC
  Filled 2023-07-20: qty 40

## 2023-07-20 MED ORDER — PHENYLEPHRINE 80 MCG/ML (10ML) SYRINGE FOR IV PUSH (FOR BLOOD PRESSURE SUPPORT)
PREFILLED_SYRINGE | INTRAVENOUS | Status: AC
  Filled 2023-07-20: qty 10

## 2023-07-20 MED ORDER — PROPOFOL 500 MG/50ML IV EMUL
INTRAVENOUS | Status: DC | PRN
  Administered 2023-07-20: 100 ug/kg/min via INTRAVENOUS

## 2023-07-20 MED ORDER — PROPOFOL 10 MG/ML IV BOLUS
INTRAVENOUS | Status: DC | PRN
  Administered 2023-07-20: 50 mg via INTRAVENOUS
  Administered 2023-07-20: 20 mg via INTRAVENOUS
  Administered 2023-07-20: 80 mg via INTRAVENOUS

## 2023-07-20 MED ORDER — LIDOCAINE HCL (CARDIAC) PF 100 MG/5ML IV SOSY
PREFILLED_SYRINGE | INTRAVENOUS | Status: DC | PRN
  Administered 2023-07-20: 60 mg via INTRAVENOUS

## 2023-07-20 MED ORDER — PHENYLEPHRINE HCL (PRESSORS) 10 MG/ML IV SOLN
INTRAVENOUS | Status: DC | PRN
  Administered 2023-07-20: 160 ug via INTRAVENOUS
  Administered 2023-07-20 (×4): 80 ug via INTRAVENOUS
  Administered 2023-07-20: 160 ug via INTRAVENOUS
  Administered 2023-07-20: 80 ug via INTRAVENOUS

## 2023-07-20 MED ORDER — SODIUM CHLORIDE 0.9 % IV SOLN: INTRAVENOUS | Status: DC

## 2023-07-20 MED ORDER — EPHEDRINE SULFATE (PRESSORS) 50 MG/ML IJ SOLN
INTRAMUSCULAR | Status: DC | PRN
  Administered 2023-07-20: 5 mg via INTRAVENOUS

## 2023-07-20 MED ORDER — EPHEDRINE 5 MG/ML INJ
INTRAVENOUS | Status: AC
  Filled 2023-07-20: qty 5

## 2023-07-20 NOTE — Op Note (Signed)
[facility] Gastroenterology Patient Name: Lori Brady Procedure Date: 07/20/2023 9:55 AM MRN: [id number] Account #: [id number] Date of Birth: 09-26-1946 Admit Type: Outpatient Age: 77 Room: [facility] ENDO ROOM 2 Gender: Female Note Status: Finalized Instrument Name: Patton Salles Endoscope [id number] Procedure:             Upper GI endoscopy Indications:           Iron deficiency anemia Providers:             Wyline Mood MD, MD Referring MD:          Marzella Schlein. Bacigalupo (Referring MD) Medicines:             Monitored Anesthesia Care Complications:         No immediate complications. Procedure:             Pre-Anesthesia Assessment:                        - Prior to the procedure, a History and Physical was                         performed, and patient medications, allergies and                         sensitivities were reviewed. The patient's tolerance                         of previous anesthesia was reviewed.                        - The risks and benefits of the procedure and the                         sedation options and risks were discussed with the                         patient. All questions were answered and informed                         consent was obtained.                        - ASA Grade Assessment: II - A patient with mild                         systemic disease.                        After obtaining informed consent, the endoscope was                         passed under direct vision. Throughout the procedure,                         the patient's blood pressure, pulse, and oxygen                         saturations were monitored continuously. The Endoscope                         was  introduced through the mouth, and advanced to the                         third part of duodenum. The upper GI endoscopy was                         accomplished with ease. The patient tolerated the                         procedure well. Findings:      The esophagus  was normal.      A medium-sized hiatal hernia was present.      Diffuse moderate inflammation characterized by congestion (edema) and       erythema was found on the greater curvature of the gastric antrum.       Biopsies were taken with a cold forceps for histology.      The examined duodenum was normal. Impression:            - Normal esophagus.                        - Medium-sized hiatal hernia.                        - Gastritis. Biopsied.                        - Normal examined duodenum. Recommendation:        - Await pathology results.                        - Perform a colonoscopy today. Procedure Code(s):     --- Professional ---                        [phone], Esophagogastroduodenoscopy, flexible,                         transoral; with biopsy, single or multiple Diagnosis Code(s):     --- Professional ---                        K44.9, Diaphragmatic hernia without obstruction or                         gangrene                        K29.70, Gastritis, unspecified, without bleeding                        D50.9, Iron deficiency anemia, unspecified CPT copyright 2022 American Medical Association. All rights reserved. The codes documented in this report are preliminary and upon coder review may  be revised to meet current compliance requirements. Wyline Mood, MD Wyline Mood MD, MD 07/20/2023 10:13:57 AM This report has been signed electronically. Number of Addenda: 0 Note Initiated On: 07/20/2023 9:55 AM Estimated Blood Loss:  Estimated blood loss: none.      [facility]

## 2023-07-20 NOTE — Anesthesia Preprocedure Evaluation (Signed)
Anesthesia Evaluation  Patient identified by MRN, date of birth, ID band Patient awake    Reviewed: Allergy & Precautions, NPO status , Patient's Chart, lab work & pertinent test results  History of Anesthesia Complications Negative for: history of anesthetic complications  Airway Mallampati: III  TM Distance: >3 FB Neck ROM: full    Dental  (+) Poor Dentition   Pulmonary asthma , resolved, COPD, former smoker    + wheezing      Cardiovascular hypertension, + CAD, + Past MI and + Peripheral Vascular Disease  Normal cardiovascular exam  Takotsubo stress-induced cardiomyopathy in 2009 with improved E   Neuro/Psych   Anxiety      Neuromuscular disease  negative psych ROS   GI/Hepatic Neg liver ROS,GERD  ,,  Endo/Other  negative endocrine ROS    Renal/GU negative Renal ROS  negative genitourinary   Musculoskeletal  (+) Arthritis ,    Abdominal   Peds  Hematology  (+) Blood dyscrasia, anemia   Anesthesia Other Findings PMH of COPD with inhaler use. Patient states she used her rescue inhaler this morning because she felt wheezy. On physical exam, patient was very wheezy bilaterally. Ordered a pre op breathing treatment.   Past Medical History: No date: Arthritis No date: Asthma No date: Crohn's disease (HCC) No date: Emphysema of lung (HCC) No date: GERD (gastroesophageal reflux disease) 01/26/2017: Hip fracture (HCC) No date: History of chicken pox No date: History of heart attack No date: Hx of completed stroke No date: Hypercholesteremia No date: Hypertension No date: Parkinson disease (HCC) No date: Pneumonia No date: Seasonal allergies No date: Sepsis (HCC) 10/08/2016: Septic shock (HCC) No date: UTI (lower urinary tract infection)  Past Surgical History: 1960's to 90's: BREAST BIOPSY; Bilateral No date: breast cysts No date: CATARACT EXTRACTION, BILATERAL No date: ELBOW SURGERY     Comment:  right No  date: right wrist fracture No date: TUBAL LIGATION No date: WRIST SURGERY; Left     Reproductive/Obstetrics negative OB ROS                              Anesthesia Physical Anesthesia Plan  ASA: 3  Anesthesia Plan: General   Post-op Pain Management: Minimal or no pain anticipated   Induction: Intravenous  PONV Risk Score and Plan: 3 and Propofol infusion, TIVA and Ondansetron  Airway Management Planned: Nasal Cannula  Additional Equipment: None  Intra-op Plan:   Post-operative Plan:   Informed Consent: I have reviewed the patients History and Physical, chart, labs and discussed the procedure including the risks, benefits and alternatives for the proposed anesthesia with the patient or authorized representative who has indicated his/her understanding and acceptance.     Dental advisory given  Plan Discussed with: CRNA and Surgeon  Anesthesia Plan Comments: (Discussed risks of anesthesia with patient, including possibility of difficulty with spontaneous ventilation under anesthesia necessitating airway intervention, PONV, and rare risks such as cardiac or respiratory or neurological events, and allergic reactions. Discussed the role of CRNA in patient's perioperative care. Patient understands.)         Anesthesia Quick Evaluation

## 2023-07-20 NOTE — Transfer of Care (Signed)
Immediate Anesthesia Transfer of Care Note  Patient: Lori Brady  Procedure(s) Performed: COLONOSCOPY WITH PROPOFOL ESOPHAGOGASTRODUODENOSCOPY (EGD) WITH PROPOFOL BIOPSY POLYPECTOMY HOT HEMOSTASIS (ARGON PLASMA COAGULATION/BICAP)  Patient Location: PACU  Anesthesia Type:MAC  Level of Consciousness: awake  Airway & Oxygen Therapy: Patient Spontanous Breathing  Post-op Assessment: Report given to RN and Post -op Vital signs reviewed and stable  Post vital signs: Reviewed and stable  Last Vitals:  Vitals Value Taken Time  BP 111/69 07/20/23 1050  Temp 36.4 C 07/20/23 1050  Pulse 86 07/20/23 1052  Resp 20 07/20/23 1052  SpO2 100 % 07/20/23 1052  Vitals shown include unfiled device data.  Last Pain:  Vitals:   07/20/23 1050  TempSrc: Temporal  PainSc: 0-No pain         Complications: No notable events documented.

## 2023-07-20 NOTE — Anesthesia Postprocedure Evaluation (Signed)
Anesthesia Post Note  Patient: Lori Brady  Procedure(s) Performed: COLONOSCOPY WITH PROPOFOL ESOPHAGOGASTRODUODENOSCOPY (EGD) WITH PROPOFOL BIOPSY POLYPECTOMY HOT HEMOSTASIS (ARGON PLASMA COAGULATION/BICAP)  Patient location during evaluation: Endoscopy Anesthesia Type: General Level of consciousness: awake and alert Pain management: pain level controlled Vital Signs Assessment: post-procedure vital signs reviewed and stable Respiratory status: spontaneous breathing, nonlabored ventilation, respiratory function stable and patient connected to nasal cannula oxygen Cardiovascular status: blood pressure returned to baseline and stable Postop Assessment: no apparent nausea or vomiting Anesthetic complications: no   No notable events documented.   Last Vitals:  Vitals:   07/20/23 1050 07/20/23 1100  BP: 111/69 102/77  Pulse: 85   Resp: 19   Temp: (!) 36.4 C   SpO2: 100%     Last Pain:  Vitals:   07/20/23 1100  TempSrc:   PainSc: 0-No pain                 Louie Boston

## 2023-07-20 NOTE — H&P (Signed)
Wyline Mood, MD [address], [address], [place], [place], [address] [address], [address], [place], [place], [address] Phone: [phone]  Fax: [phone]  Primary Care Physician:  Erasmo Downer, MD   Pre-Procedure History & Physical: HPI:  NISHKA SALINAS is a 77 y.o. female is here for an endoscopy and colonoscopy    Past Medical History:  Diagnosis Date   Arthritis    Asthma    Crohn's disease (HCC)    Emphysema of lung (HCC)    GERD (gastroesophageal reflux disease)    Hip fracture (HCC) 01/26/2017   History of chicken pox    History of heart attack    Hx of completed stroke    Hypercholesteremia    Hypertension    Parkinson disease    Pneumonia    Seasonal allergies    Sepsis (HCC)    Septic shock (HCC) 10/08/2016   UTI (lower urinary tract infection)     Past Surgical History:  Procedure Laterality Date   BREAST BIOPSY Bilateral 1960's to 90's   breast cysts     CATARACT EXTRACTION, BILATERAL     COLONOSCOPY WITH PROPOFOL N/A 06/01/2022   Procedure: COLONOSCOPY WITH PROPOFOL;  Surgeon: Wyline Mood, MD;  Location: [facility] ENDOSCOPY;  Service: Gastroenterology;  Laterality: N/A;   COLONOSCOPY WITH PROPOFOL N/A 04/19/2023   Procedure: COLONOSCOPY WITH PROPOFOL;  Surgeon: Wyline Mood, MD;  Location: [facility] ENDOSCOPY;  Service: Gastroenterology;  Laterality: N/A;   ELBOW SURGERY     right   ESOPHAGOGASTRODUODENOSCOPY (EGD) WITH PROPOFOL N/A 04/19/2023   Procedure: ESOPHAGOGASTRODUODENOSCOPY (EGD) WITH PROPOFOL;  Surgeon: Wyline Mood, MD;  Location: [facility] ENDOSCOPY;  Service: Gastroenterology;  Laterality: N/A;   right wrist fracture     TUBAL LIGATION     WRIST SURGERY Left     Prior to Admission medications   Medication Sig Start Date End Date Taking? Authorizing Provider  albuterol (ACCUNEB) 0.63 MG/3ML nebulizer solution Take 3 mLs (0.63 mg total) by nebulization every 6 (six) hours as needed for wheezing. 07/07/23   Sallyanne Kuster, NP  albuterol  (ACCUNEB) 0.63 MG/3ML nebulizer solution Inhale 1 vial via nebulizer every 6 hours as needed for wheezing 02/03/23   Yevonne Pax, MD  albuterol (VENTOLIN HFA) 108 (90 Base) MCG/ACT inhaler Inhale 2 puffs into the lungs every 6 (six) hours as needed for wheezing or shortness of breath. 07/07/23   Sallyanne Kuster, NP  alendronate (FOSAMAX) 70 MG tablet Take 1 tablet (70 mg total) by mouth once a week -take on an empty stomach before breakfast with 8 oz of water. Remaine upright for 30 minutes. 07/08/23   Erasmo Downer, MD  aspirin 81 MG EC tablet Take 81 mg by mouth daily with breakfast. 11/12/21   [provider]  atorvastatin (LIPITOR) 40 MG tablet Take 1 tablet (40 mg total) by mouth daily. 07/08/23   Erasmo Downer, MD  azelastine (ASTELIN) 0.1 % nasal spray Place 2 sprays into both nostrils daily at 6 (six) AM. 09/05/20   [provider]  Budeson-Glycopyrrol-Formoterol (BREZTRI AEROSPHERE) 160-9-4.8 MCG/ACT AERO Inhale 2 puffs into the lungs 2 (two) times daily. 07/08/23   Erasmo Downer, MD  carbidopa-levodopa (SINEMET CR) 50-200 MG tablet Take 1 tablet by mouth at bedtime. 01/13/22   [provider]  carbidopa-levodopa (SINEMET CR) 50-200 MG tablet Take 1 tablet by mouth at bedtime 07/07/23     carbidopa-levodopa (SINEMET IR) 25-100 MG tablet Take 3 tablets with breakfast, 2 tablets  with lunch, 2 tablets with supper. 01/13/22   [provider]  carbidopa-levodopa (SINEMET IR) 25-100 MG tablet Take 3 tablets every morning AND 2 tablets 2 (two) times daily. 07/07/23     cetirizine (ZYRTEC) 10 MG tablet Take 1 tablet (10 mg total) by mouth daily. 07/07/23   Sallyanne Kuster, NP  Cholecalciferol (VITAMIN D) 2000 UNITS CAPS Take 1 capsule by mouth daily.    [provider]  citalopram (CELEXA) 20 MG tablet Take 1 tablet (20 mg total) by mouth daily. 07/08/23   Erasmo Downer, MD  doxepin (SINEQUAN) 25 MG capsule Take 1 capsule (25 mg  total) by mouth at bedtime. 07/08/23   Erasmo Downer, MD  ferrous sulfate 325 (65 FE) MG EC tablet Take 650 mg by mouth daily with breakfast.    [provider]  Fluticasone-Umeclidin-Vilant (TRELEGY ELLIPTA) 100-62.5-25 MCG/ACT AEPB Inhale 1 puff into the lungs daily. 02/03/23     melatonin 5 MG TABS Take 1 tablet by mouth at bedtime.    [provider]  montelukast (SINGULAIR) 10 MG tablet Take 1 tablet (10 mg total) by mouth every morning. 07/08/23   Bacigalupo, Marzella Schlein, MD  omeprazole (PRILOSEC) 40 MG capsule Take 1 capsule (40 mg total) by mouth at bedtime. 07/08/23   Wyline Mood, MD  sodium fluoride (PREVIDENT 5000 DRY MOUTH) 1.1 % GEL dental gel Use as directed 11/17/22     Sodium Sulfate-Mag Sulfate-KCl (SUTAB) [phone] MG TABS At 5 PM take 12 tablets using the 8 oz cup provided in the kit drinking 5 cups of water and 5 hours before your procedure repeat the same process. 06/08/23   Wyline Mood, MD    Allergies as of 06/08/2023 - Review Complete 04/16/2023  Allergen Reaction Noted   Levofloxacin Nausea Only and Nausea And Vomiting 05/03/2015   Vicodin [hydrocodone-acetaminophen] Nausea Only 08/01/2014    Family History  Problem Relation Age of Onset   Cancer Sister        ovarian/uterine/breast   Breast cancer Sister 104   Cancer Sister        breast   Heart disease Sister    Breast cancer Sister 54   Heart disease Father    Cancer Father        liver   Pancreatic cancer Mother    Heart disease Brother    Heart attack Brother    Cancer Brother        liver   Heart disease Sister    Diabetes Maternal Grandmother     Social History   Socioeconomic History   Marital status: Widowed    Spouse name: Not on file   Number of children: 3   Years of education: Not on file   Highest education level: Some college, no degree  Occupational History   Occupation: retired  Tobacco Use   Smoking status: Former    Current packs/day: 0.00    Average  packs/day: 1 pack/day for 30.0 years (30.0 ttl pk-yrs)    Types: Cigarettes    Start date: 08/01/1978    Quit date: 08/01/2008    Years since quitting: 14.9   Smokeless tobacco: Never  Vaping Use   Vaping status: Never Used  Substance and Sexual Activity   Alcohol use: Not Currently    Alcohol/week: 1.0 standard drink of alcohol    Types: 1 Shots of liquor per week   Drug use: No   Sexual activity: Not on file  Other Topics Concern   Not  on file  Social History Narrative   Not on file   Social Determinants of Health   Financial Resource Strain: Low Risk  (02/02/2023)   Overall Financial Resource Strain (CARDIA)    Difficulty of Paying Living Expenses: Not very hard  Food Insecurity: No Food Insecurity (02/02/2023)   Hunger Vital Sign    Worried About Running Out of Food in the Last Year: Never true    Ran Out of Food in the Last Year: Never true  Transportation Needs: No Transportation Needs (02/02/2023)   PRAPARE - Administrator, Civil Service (Medical): No    Lack of Transportation (Non-Medical): No  Physical Activity: Insufficiently Active (02/02/2023)   Exercise Vital Sign    Days of Exercise per Week: 3 days    Minutes of Exercise per Session: 20 min  Stress: No Stress Concern Present (02/02/2023)   [employer] of Occupational Health - Occupational Stress Questionnaire    Feeling of Stress : Not at all  Social Connections: Unknown (02/02/2023)   Social Connection and Isolation Panel [NHANES]    Frequency of Communication with Friends and Family: More than three times a week    Frequency of Social Gatherings with Friends and Family: Once a week    Attends Religious Services: Not on Insurance claims handler of Clubs or Organizations: No    Attends Banker Meetings: Never    Marital Status: Widowed  Intimate Partner Violence: Not At Risk (02/03/2023)   Humiliation, Afraid, Rape, and Kick questionnaire    Fear of Current or Ex-Partner: No     Emotionally Abused: No    Physically Abused: No    Sexually Abused: No    Review of Systems: See HPI, otherwise negative ROS  Physical Exam: There were no vitals taken for this visit. General:   Alert,  pleasant and cooperative in NAD Head:  Normocephalic and atraumatic. Neck:  Supple; no masses or thyromegaly. Lungs:  Clear throughout to auscultation, normal respiratory effort.    Heart:  +S1, +S2, Regular rate and rhythm, No edema. Abdomen:  Soft, nontender and nondistended. Normal bowel sounds, without guarding, and without rebound.   Neurologic:  Alert and  oriented x4;  grossly normal neurologically.  Impression/Plan: [name] is here for an endoscopy and colonoscopy  to be performed for  evaluation of iron deficiency anemia    Risks, benefits, limitations, and alternatives regarding endoscopy have been reviewed with the patient.  Questions have been answered.  All parties agreeable.   Wyline Mood, MD  07/20/2023, 9:15 AM

## 2023-07-20 NOTE — Op Note (Signed)
[facility] Gastroenterology Patient Name: Lori Brady Procedure Date: 07/20/2023 9:54 AM MRN: [id number] Account #: [id number] Date of Birth: [birth year] Admit Type: Outpatient Age: 77 Room: [facility] ENDO ROOM 2 Gender: Female Note Status: Finalized Instrument Name: Prentice Docker [id number] Procedure:             Colonoscopy Indications:           Iron deficiency anemia Providers:             Wyline Mood MD, MD Referring MD:          Marzella Schlein. Bacigalupo (Referring MD) Medicines:             Monitored Anesthesia Care Complications:         No immediate complications. Procedure:             Pre-Anesthesia Assessment:                        - Prior to the procedure, a History and Physical was                         performed, and patient medications, allergies and                         sensitivities were reviewed. The patient's tolerance                         of previous anesthesia was reviewed.                        - The risks and benefits of the procedure and the                         sedation options and risks were discussed with the                         patient. All questions were answered and informed                         consent was obtained.                        - The risks and benefits of the procedure and the                         sedation options and risks were discussed with the                         patient. All questions were answered and informed                         consent was obtained.                        - ASA Grade Assessment: II - A patient with mild                         systemic disease.                        After obtaining informed consent,  the colonoscope was                         passed under direct vision. Throughout the procedure,                         the patient's blood pressure, pulse, and oxygen                         saturations were monitored continuously. The                         Colonoscope was  introduced through the anus and                         advanced to the the cecum, identified by the                         appendiceal orifice. The colonoscopy was performed                         with ease. The patient tolerated the procedure well.                         The quality of the bowel preparation was excellent.                         The ileocecal valve, appendiceal orifice, and rectum                         were photographed. Findings:      The perianal and digital rectal examinations were normal.      A 7 mm polyp was found in the descending colon. The polyp was sessile.       The polyp was removed with a cold snare. Resection and retrieval were       complete.      Three medium-sized localized angioectasias without bleeding were found       in the cecum. Coagulation for hemostasis using argon plasma at 0.5       liters/minute and 20 watts was successful.      The exam was otherwise without abnormality on direct and retroflexion       views. Impression:            - One 7 mm polyp in the descending colon, removed with                         a cold snare. Resected and retrieved.                        - Three non-bleeding colonic angioectasias. Treated                         with argon plasma coagulation (APC).                        - The examination was otherwise normal on direct and                         retroflexion views.  Recommendation:        - Discharge patient to home (with escort).                        - Resume previous diet.                        - Continue present medications.                        - Await pathology results.                        - Repeat colonoscopy for surveillance based on                         pathology results.                        - To visualize the small bowel, perform video capsule                         endoscopy in 4 weeks. Procedure Code(s):     --- Professional ---                        [phone], 59, Colonoscopy,  flexible; with control of                         bleeding, any method                        45385, Colonoscopy, flexible; with removal of                         tumor(s), polyp(s), or other lesion(s) by snare                         technique Diagnosis Code(s):     --- Professional ---                        D12.4, Benign neoplasm of descending colon                        K55.20, Angiodysplasia of colon without hemorrhage                        D50.9, Iron deficiency anemia, unspecified CPT copyright 2022 American Medical Association. All rights reserved. The codes documented in this report are preliminary and upon coder review may  be revised to meet current compliance requirements. Wyline Mood, MD Wyline Mood MD, MD 07/20/2023 10:45:43 AM This report has been signed electronically. Number of Addenda: 0 Note Initiated On: 07/20/2023 9:54 AM Scope Withdrawal Time: 0 hours 9 minutes 44 seconds  Total Procedure Duration: 0 hours 27 minutes 41 seconds  Estimated Blood Loss:  Estimated blood loss: none.      [facility]

## 2023-07-21 ENCOUNTER — Encounter: Payer: Self-pay | Admitting: Gastroenterology

## 2023-07-21 ENCOUNTER — Other Ambulatory Visit: Payer: Medicare PPO | Admitting: Pharmacist

## 2023-07-21 ENCOUNTER — Other Ambulatory Visit (HOSPITAL_COMMUNITY): Payer: Self-pay

## 2023-07-21 NOTE — Progress Notes (Signed)
   07/21/2023  Patient ID: Lori Brady, female   DOB: [birth year], 77 y.o.   MRN: [id number]   Outreach to patient today as scheduled to follow up regarding medication management/adherence packaging  Patient confirms that she is now successfully established with [facility] for adherence packaging and received her medications from pharmacy. Reports only medication not yet received was her alendronate. From review of chart, note PCP sent renewal of her alendronate prescription to [facility] on 07/08/2023. - Patient confirms will follow up with pharmacy regarding receiving alendronate prescription.   Patient denies further medication questions or concerns today Provide patient with contact information for clinic pharmacist to contact if needed in future for medication questions/concerns  Estelle Grumbles, PharmD, [facility] [phone]

## 2023-07-21 NOTE — Patient Instructions (Signed)
Goals Addressed             This Visit's Progress    Pharmacy Goals       Please follow up with [facility] at [phone] to discuss getting refill of your alendronate prescription  Thank you!  Estelle Grumbles, PharmD, [facility] [phone]

## 2023-07-22 ENCOUNTER — Other Ambulatory Visit (HOSPITAL_COMMUNITY): Payer: Self-pay

## 2023-07-23 ENCOUNTER — Other Ambulatory Visit: Payer: Self-pay

## 2023-07-24 ENCOUNTER — Other Ambulatory Visit: Payer: Self-pay

## 2023-07-27 ENCOUNTER — Telehealth: Payer: Self-pay

## 2023-07-27 ENCOUNTER — Ambulatory Visit: Payer: Medicare PPO | Admitting: Gastroenterology

## 2023-07-27 ENCOUNTER — Other Ambulatory Visit: Payer: Self-pay

## 2023-07-27 DIAGNOSIS — D508 Other iron deficiency anemias: Secondary | ICD-10-CM

## 2023-07-27 NOTE — Telephone Encounter (Signed)
Called patient to let her know that Dr. Tobi Bastos wanted her to have a capsule study when she had her colonoscopy done. Therefore, I let her know that we will need to order her capsule study and then reschedule her follow up with Dr. Tobi Bastos in 2 weeks. Patient agreed. Instructions were provided and given to the patient. Patient's follow up appointment was reschedule to 08/12/2023. Patient had no further questions.

## 2023-07-28 ENCOUNTER — Other Ambulatory Visit (HOSPITAL_COMMUNITY): Payer: Self-pay

## 2023-07-28 ENCOUNTER — Other Ambulatory Visit: Payer: Self-pay | Admitting: Pharmacist

## 2023-07-28 ENCOUNTER — Telehealth: Payer: Self-pay

## 2023-07-28 NOTE — Progress Notes (Signed)
   07/28/2023  Patient ID: [name], female   DOB: 03-02-1946, 77 y.o.   MRN: [id number]  Receive a voicemail from patient requesting a call back. Return call to patient.  Patient reports that she is enrolled in patient assistance for Breztri inhaler from [employer] through 11/16/2023. Reports currently has a 4 month supply of Breztri at this time.  Reports that she recently received a shipment of the Breztri inhaler form [facility] billed through her [facility] prescription coverage.   Requests support with re-enrollment for patient assistance for Breztri inhaler from [employer] for 2025 calendar year. Note for next calendar year, [employer] is using email or text to patient to start re-enrollment process. However, patient shares that she does not use text or email.   Plan:  1) Patient plans to follow up with [facility] to request pharmacy hold off on sending further refills of Breztri inhaler  2) Will collaborate with [facility] Noreene Larsson [place] for support to patient with re-enrollment process for patient assistance for Breztri inhaler from [employer] for 2025 calendar year.  Estelle Grumbles, PharmD, [facility] [phone]

## 2023-07-28 NOTE — Telephone Encounter (Signed)
Lori Brady with [facility] called stating she is needed patient last office visit faxed to her because she only received the colonoscopy, EGD reports and paths. Printed labs and last office visit and faxed to [phone]. Called her and left her a detail message at [phone].  Informing her I have this over

## 2023-07-28 NOTE — Patient Instructions (Addendum)
Goals Addressed             This Visit's Progress    Pharmacy Goals       Please follow up with [place] [facility] at [phone] as needed for refills of your medications  If you need to reach out to the [employer] patient assistance programs regarding refills or to find out the status of your application, you can do so by calling: [phone]  Thank you!  Estelle Grumbles, PharmD, [facility] [phone]

## 2023-08-03 ENCOUNTER — Encounter
Admission: RE | Disposition: A | Discharge status: Home / Self Care | Payer: Self-pay | Source: Home / Self Care | Attending: Gastroenterology

## 2023-08-03 ENCOUNTER — Encounter: Payer: Self-pay | Admitting: General Practice

## 2023-08-03 ENCOUNTER — Ambulatory Visit
Admission: RE | Admit: 2023-08-03 | Discharge: 2023-08-03 | Disposition: A | Discharge status: Home / Self Care | Payer: Medicare PPO | Attending: Gastroenterology | Admitting: Gastroenterology

## 2023-08-03 DIAGNOSIS — Z791 Long term (current) use of non-steroidal anti-inflammatories (NSAID): Secondary | ICD-10-CM | POA: Diagnosis not present

## 2023-08-03 DIAGNOSIS — K219 Gastro-esophageal reflux disease without esophagitis: Secondary | ICD-10-CM | POA: Diagnosis not present

## 2023-08-03 DIAGNOSIS — K2970 Gastritis, unspecified, without bleeding: Secondary | ICD-10-CM | POA: Diagnosis not present

## 2023-08-03 DIAGNOSIS — D509 Iron deficiency anemia, unspecified: Secondary | ICD-10-CM | POA: Diagnosis not present

## 2023-08-03 DIAGNOSIS — D508 Other iron deficiency anemias: Secondary | ICD-10-CM | POA: Diagnosis present

## 2023-08-03 DIAGNOSIS — D500 Iron deficiency anemia secondary to blood loss (chronic): Secondary | ICD-10-CM | POA: Diagnosis not present

## 2023-08-03 HISTORY — PX: GIVENS CAPSULE STUDY: SHX5432

## 2023-08-03 SURGERY — IMAGING PROCEDURE, GI TRACT, INTRALUMINAL, VIA CAPSULE

## 2023-08-04 ENCOUNTER — Encounter: Payer: Self-pay | Admitting: Gastroenterology

## 2023-08-10 ENCOUNTER — Encounter (INDEPENDENT_AMBULATORY_CARE_PROVIDER_SITE_OTHER): Payer: Medicare PPO

## 2023-08-10 ENCOUNTER — Ambulatory Visit (INDEPENDENT_AMBULATORY_CARE_PROVIDER_SITE_OTHER): Payer: Medicare PPO | Admitting: Vascular Surgery

## 2023-08-11 ENCOUNTER — Encounter: Payer: Self-pay | Admitting: Internal Medicine

## 2023-08-11 ENCOUNTER — Other Ambulatory Visit (INDEPENDENT_AMBULATORY_CARE_PROVIDER_SITE_OTHER): Payer: Self-pay | Admitting: Vascular Surgery

## 2023-08-11 ENCOUNTER — Other Ambulatory Visit: Payer: Self-pay

## 2023-08-11 DIAGNOSIS — I6523 Occlusion and stenosis of bilateral carotid arteries: Secondary | ICD-10-CM

## 2023-08-12 ENCOUNTER — Other Ambulatory Visit: Payer: Self-pay

## 2023-08-12 ENCOUNTER — Ambulatory Visit: Payer: Medicare PPO | Admitting: Gastroenterology

## 2023-08-12 ENCOUNTER — Other Ambulatory Visit (HOSPITAL_COMMUNITY): Payer: Self-pay

## 2023-08-13 ENCOUNTER — Other Ambulatory Visit: Payer: Self-pay

## 2023-08-16 ENCOUNTER — Ambulatory Visit (INDEPENDENT_AMBULATORY_CARE_PROVIDER_SITE_OTHER): Payer: Medicare PPO

## 2023-08-16 ENCOUNTER — Other Ambulatory Visit (HOSPITAL_COMMUNITY): Payer: Self-pay

## 2023-08-16 ENCOUNTER — Other Ambulatory Visit: Payer: Self-pay

## 2023-08-16 DIAGNOSIS — I6523 Occlusion and stenosis of bilateral carotid arteries: Secondary | ICD-10-CM

## 2023-08-17 ENCOUNTER — Encounter: Payer: Self-pay | Admitting: Family Medicine

## 2023-08-17 ENCOUNTER — Ambulatory Visit: Payer: Medicare PPO | Admitting: Family Medicine

## 2023-08-17 DIAGNOSIS — Z23 Encounter for immunization: Secondary | ICD-10-CM

## 2023-08-17 NOTE — Progress Notes (Signed)
Patient here for Shingrix and flu vaccination only.  I did not examine the patient.  I did review her medical history, medications, and allergies and vaccine consent form.  CMA gave vaccination. Patient tolerated well.  Erasmo Downer, MD, MPH [facility] 08/17/2023 2:15 PM

## 2023-08-18 ENCOUNTER — Telehealth: Payer: Self-pay

## 2023-08-18 NOTE — Telephone Encounter (Signed)
Called patient to let her know the below information that Dr. Tobi Bastos sent me via secure chat: The capsule study of the small bowel was negative. The most likely reason for the iron deficiency anemia is due to the hiatal hernia and NSAID use all she needs to do is stop using NSAID's and to use a PPI and get her iron replaced. If she still insist on seeing me, I can see her, but this is what I would be telling her tomorrow. Her hemoglobin is stable presently can just go back to pcp to follow up. Lori Brady understood her results and I was able to let her know that she needed to follow up with her PCP and if she had issues with her anemia again, to please call us back. Patient understood  and no further questions.

## 2023-08-19 ENCOUNTER — Ambulatory Visit: Payer: Medicare PPO | Admitting: Gastroenterology

## 2023-08-25 DIAGNOSIS — D485 Neoplasm of uncertain behavior of skin: Secondary | ICD-10-CM | POA: Diagnosis not present

## 2023-08-25 DIAGNOSIS — C44311 Basal cell carcinoma of skin of nose: Secondary | ICD-10-CM | POA: Diagnosis not present

## 2023-09-01 ENCOUNTER — Other Ambulatory Visit (HOSPITAL_COMMUNITY): Payer: Self-pay

## 2023-09-06 ENCOUNTER — Ambulatory Visit
Admission: RE | Admit: 2023-09-06 | Discharge: 2023-09-06 | Disposition: A | Discharge status: Home / Self Care | Payer: Medicare PPO | Source: Ambulatory Visit | Attending: Family Medicine | Admitting: Family Medicine

## 2023-09-06 ENCOUNTER — Other Ambulatory Visit: Payer: Self-pay

## 2023-09-06 ENCOUNTER — Other Ambulatory Visit (HOSPITAL_COMMUNITY): Payer: Self-pay

## 2023-09-06 ENCOUNTER — Encounter: Payer: Self-pay | Admitting: Internal Medicine

## 2023-09-06 ENCOUNTER — Other Ambulatory Visit: Payer: Self-pay | Admitting: Nurse Practitioner

## 2023-09-06 DIAGNOSIS — Z0000 Encounter for general adult medical examination without abnormal findings: Secondary | ICD-10-CM | POA: Diagnosis not present

## 2023-09-06 DIAGNOSIS — Z1231 Encounter for screening mammogram for malignant neoplasm of breast: Secondary | ICD-10-CM | POA: Diagnosis not present

## 2023-09-06 DIAGNOSIS — M810 Age-related osteoporosis without current pathological fracture: Secondary | ICD-10-CM

## 2023-09-06 DIAGNOSIS — J301 Allergic rhinitis due to pollen: Secondary | ICD-10-CM

## 2023-09-06 DIAGNOSIS — R0602 Shortness of breath: Secondary | ICD-10-CM

## 2023-09-06 DIAGNOSIS — J4489 Other specified chronic obstructive pulmonary disease: Secondary | ICD-10-CM

## 2023-09-06 MED ORDER — CETIRIZINE HCL 10 MG PO TABS
10.0000 mg | ORAL_TABLET | Freq: Every day | ORAL | 1 refills | Status: DC
  Filled 2023-09-06: qty 30, 30d supply, fill #0
  Filled 2023-10-11: qty 30, 30d supply, fill #1

## 2023-09-06 MED ORDER — ALBUTEROL SULFATE HFA 108 (90 BASE) MCG/ACT IN AERS
2.0000 | INHALATION_SPRAY | Freq: Four times a day (QID) | RESPIRATORY_TRACT | 1 refills | Status: DC | PRN
  Filled 2023-09-06: qty 18, 25d supply, fill #0

## 2023-09-07 ENCOUNTER — Encounter: Payer: Self-pay | Admitting: Gastroenterology

## 2023-09-08 ENCOUNTER — Other Ambulatory Visit: Payer: Self-pay | Admitting: Pharmacist

## 2023-09-08 ENCOUNTER — Telehealth: Payer: Self-pay | Admitting: Pharmacist

## 2023-09-08 NOTE — Telephone Encounter (Signed)
Pt is calling in returning a call from [place]. Please follow up with pt.

## 2023-09-08 NOTE — Progress Notes (Signed)
   Outreach Note  09/08/2023 Name: OZELL LEREW MRN: [id number] DOB: [birth year]  Referred by: Erasmo Downer, MD  Was unable to reach patient via telephone today and have left HIPAA compliant voicemail asking patient to return my call.    Follow Up Plan: Will collaborate with Care Guide to outreach to schedule follow up with pharmacist  Estelle Grumbles, PharmD, [facility] [phone]

## 2023-09-09 ENCOUNTER — Telehealth: Payer: Self-pay

## 2023-09-09 NOTE — Progress Notes (Signed)
  Care Coordination Note  09/09/2023 Name: [name] MRN: [id number] DOB: [birth year]  Lori Brady is a 77 y.o. year old female who is a primary care patient of Beryle Flock, Marzella Schlein, MD and is actively engaged with the  Care Management team. I reached out to Lori Brady by phone today to assist with re-scheduling a follow up visit with the Pharmacist  Follow up plan: Unsuccessful telephone outreach attempt made.   Penne Lash, RMA Care Guide [facility]  [place], [place] [address] Direct Dial: [phone] [email or web address]

## 2023-09-10 ENCOUNTER — Other Ambulatory Visit: Payer: Self-pay

## 2023-09-10 ENCOUNTER — Other Ambulatory Visit: Payer: Medicare PPO | Admitting: Pharmacist

## 2023-09-10 NOTE — Progress Notes (Signed)
09/10/2023 Name: Lori Brady MRN: [id number] DOB: 1946-03-27  Chief Complaint  Patient presents with   COPD   Medication Management    Lori Brady is a 77 y.o. year old female who presented for a telephone visit.   They were referred to the pharmacist by their PCP for assistance in managing complex medication management and COPD .    Stop toothpaste + doxepin with pharmacy Atorvastatin switched to nighttime  Nebulizer yesterday- rattling sound Exacerbation about 2 years ago Okay right now  Subjective:  Care Team: Primary Care Provider: Erasmo Downer, MD ; Next Scheduled Visit: 12/17/23 Clinical Pharmacist: Marlowe Aschoff, PharmD  Medication Access/Adherence  Current Pharmacy:  [facility] [address] [place] [address] Phone: [phone] Fax: [phone]  [facility] - [place], [place] - [address] [place] [address] Phone: [phone] Fax: [phone]   Patient reports affordability concerns with their medications: No  Patient reports access/transportation concerns to their pharmacy: No  Patient reports adherence concerns with their medications:  No    *Has appt with Oncology scheduled, but do NOT believe she will need iron infusion- found area of bleeding during colonoscopy and capsule study, along with a hiatal hernia  **Patient also stated she stopped the Doxepin "for itching" due to dry mouth associated, which was intolerable- no concerns since discontinuation  Osteoporosis:  Current medications: Aledronate 70mg  weekly   Also reviewed DEXA scan with the patient- spine improved, but same level of osteoporosis in the rest of her bones (did not get worse); continue the alendronate due to being high-risk patient  - She has a history of rt hip and pelvic fracture (01/2017), vertebral compression fracture (on MRI at [facility] in 03/2017), rt wrist fracture (approx 2013), and a toe fracture  (in 1970s).  - She took alendronate from 2012-2014, per clinic notes. She restarted alendronate in fall 2017 and continues to take it currently.  - She tolerates alendronate. Patient is ambulatory and does not exercise.  - Patient does not take calcium. She does take an OTC vitamin D supplement with 1000 IU daily.  - Patient has had a significant history of exposure to glucocorticoids; describes last use of Prednisone in 01/2017.    COPD:  Current medications: Breztri, albuterol HFA + nebulizer Medications tried in the past: Not reviewed  Last exacerbation occurred about 2 years ago- reports knowing when to call PCP for prednisone Rx   Current medication access support: [employer]   Objective:  Lab Results  Component Value Date   HGBA1C 5.6 06/14/2023    Lab Results  Component Value Date   CREATININE 0.52 03/22/2023   BUN 17 03/22/2023   NA 139 03/22/2023   K 3.7 03/22/2023   CL 102 03/22/2023   CO2 26 03/22/2023    Lab Results  Component Value Date   CHOL 137 06/14/2023   HDL 39 (L) 06/14/2023   LDLCALC 70 06/14/2023   TRIG 166 (H) 06/14/2023   CHOLHDL 3.5 06/14/2023    Medications Reviewed Today     Reviewed by Pollie Friar, RPH (Pharmacist) on 09/10/23 at 1124  Med List Status: <None>   Medication Order Taking? Sig Documenting Provider Last Dose Status Informant  albuterol (ACCUNEB) 0.63 MG/3ML nebulizer solution [id number] Yes Take 3 mLs (0.63 mg total) by nebulization every 6 (six) hours as needed for wheezing. Sallyanne Kuster, NP Taking Active   albuterol (ACCUNEB) 0.63 MG/3ML nebulizer solution  [id number] Yes Inhale 1 vial via nebulizer every 6 hours as needed for wheezing Yevonne Pax, MD Taking Active   albuterol (VENTOLIN HFA) 108 (90 Base) MCG/ACT inhaler [id number] Yes Inhale 2 puffs into the lungs every 6 (six) hours as needed for wheezing or shortness of breath. Sallyanne Kuster, NP Taking Active   alendronate (FOSAMAX) 70 MG tablet  [id number] Yes Take 1 tablet (70 mg total) by mouth once a week -take on an empty stomach before breakfast with 8 oz of water. Remaine upright for 30 minutes. Erasmo Downer, MD Taking Active   aspirin 81 MG EC tablet [id number] Yes Take 81 mg by mouth daily with breakfast. [provider] Taking Active   atorvastatin (LIPITOR) 40 MG tablet [id number] Yes Take 1 tablet (40 mg total) by mouth daily. Erasmo Downer, MD Taking Active   azelastine (ASTELIN) 0.1 % nasal spray [id number] Yes Place 2 sprays into both nostrils daily at 6 (six) AM. [provider] Taking Active   Budeson-Glycopyrrol-Formoterol (BREZTRI AEROSPHERE) 160-9-4.8 MCG/ACT AERO [id number] Yes Inhale 2 puffs into the lungs in the morning and at bedtime. Getting from [employer] [provider] Taking Active   carbidopa-levodopa (SINEMET CR) 50-200 MG tablet [id number] Yes Take 1 tablet by mouth at bedtime. [provider] Taking Active   carbidopa-levodopa (SINEMET CR) 50-200 MG tablet [id number] Yes Take 1 tablet by mouth at bedtime  Taking Active   carbidopa-levodopa (SINEMET IR) 25-100 MG tablet [id number] Yes Take 3 tablets with breakfast, 2 tablets with lunch, 2 tablets with supper. [provider] Taking Active   carbidopa-levodopa (SINEMET IR) 25-100 MG tablet [id number] Yes Take 3 tablets every morning AND 2 tablets 2 (two) times daily.  Taking Active   cetirizine (ZYRTEC) 10 MG tablet [id number] Yes Take 1 tablet (10 mg total) by mouth daily. Sallyanne Kuster, NP Taking Active   Cholecalciferol (VITAMIN D) 2000 UNITS CAPS [id number] Yes Take 1 capsule by mouth daily. [provider] Taking Active Self  citalopram (CELEXA) 20 MG tablet [id number] Yes Take 1 tablet (20 mg total) by mouth daily. Erasmo Downer, MD Taking Active   Discontinued 09/10/23 1124 (Patient Preference)   ferrous sulfate 325 (65 FE) MG EC tablet [id number] Yes Take 650 mg by mouth daily with breakfast.  [provider] Taking Active   melatonin 5 MG TABS [id number] Yes Take 1 tablet by mouth at bedtime. [provider] Taking Active   montelukast (SINGULAIR) 10 MG tablet [id number] Yes Take 1 tablet (10 mg total) by mouth every morning. Erasmo Downer, MD Taking Active   omeprazole (PRILOSEC) 40 MG capsule [id number] Yes Take 1 capsule (40 mg total) by mouth at bedtime. Wyline Mood, MD Taking Active   sodium fluoride (PREVIDENT 5000 DRY MOUTH) 1.1 % GEL dental gel [id number] Yes Use as directed  Taking Active   Sodium Sulfate-Mag Sulfate-KCl (SUTAB) [phone] MG TABS [id number] Yes At 5 PM take 12 tablets using the 8 oz cup provided in the kit drinking 5 cups of water and 5 hours before your procedure repeat the same process. Wyline Mood, MD Taking Consider Medication Status and Discontinue (Discontinued by provider)               Assessment/Plan:   COPD: - Currently controlled.  - Reviewed appropriate inhaler technique - Had rattles while on the phone- per patient she needed to use her nebulizer yesterday but this is manageable at this time    **Follow Up Plan:**  Requested a few  changes to compliance packaging  - Stop Breztri, toothpaste, and Doxepin- communicated to [facility] through Teams for pharmacy  - Switch Atorvastatin to nighttime pouch 2. Need to ensure the [facility] application is set-up for renewal for next year- if not able to complete online, will mail application in the mail in 2 weeks  *Of note, at next visit confirm use of Vitamin D supplement- forgot to confirm with the patient   Marlowe Aschoff, PharmD [facility] Phone Number: [phone]

## 2023-09-15 ENCOUNTER — Other Ambulatory Visit (HOSPITAL_COMMUNITY): Payer: Self-pay

## 2023-09-20 ENCOUNTER — Encounter: Payer: Self-pay | Admitting: Nurse Practitioner

## 2023-09-20 ENCOUNTER — Inpatient Hospital Stay: Payer: Medicare PPO | Admitting: Nurse Practitioner

## 2023-09-20 ENCOUNTER — Inpatient Hospital Stay: Payer: Medicare PPO | Attending: Internal Medicine

## 2023-09-20 ENCOUNTER — Inpatient Hospital Stay: Payer: Medicare PPO

## 2023-09-20 VITALS — BP 115/74 | HR 78 | Temp 98.6°F | Wt 129.4 lb

## 2023-09-20 DIAGNOSIS — D509 Iron deficiency anemia, unspecified: Secondary | ICD-10-CM | POA: Insufficient documentation

## 2023-09-20 DIAGNOSIS — Z8673 Personal history of transient ischemic attack (TIA), and cerebral infarction without residual deficits: Secondary | ICD-10-CM | POA: Diagnosis not present

## 2023-09-20 DIAGNOSIS — D649 Anemia, unspecified: Secondary | ICD-10-CM

## 2023-09-20 DIAGNOSIS — G20A1 Parkinson's disease without dyskinesia, without mention of fluctuations: Secondary | ICD-10-CM | POA: Diagnosis not present

## 2023-09-20 DIAGNOSIS — Z79899 Other long term (current) drug therapy: Secondary | ICD-10-CM | POA: Diagnosis not present

## 2023-09-20 DIAGNOSIS — Z87891 Personal history of nicotine dependence: Secondary | ICD-10-CM | POA: Insufficient documentation

## 2023-09-20 LAB — CBC WITH DIFFERENTIAL (CANCER CENTER ONLY)
Abs Immature Granulocytes: 0.17 10*3/uL — ABNORMAL HIGH (ref 0.00–0.07)
Basophils Absolute: 0 10*3/uL (ref 0.0–0.1)
Basophils Relative: 0 %
Eosinophils Absolute: 0.3 10*3/uL (ref 0.0–0.5)
Eosinophils Relative: 2 %
HCT: 35.4 % — ABNORMAL LOW (ref 36.0–46.0)
Hemoglobin: 11.3 g/dL — ABNORMAL LOW (ref 12.0–15.0)
Immature Granulocytes: 1 %
Lymphocytes Relative: 16 %
Lymphs Abs: 2 10*3/uL (ref 0.7–4.0)
MCH: 30.8 pg (ref 26.0–34.0)
MCHC: 31.9 g/dL (ref 30.0–36.0)
MCV: 96.5 fL (ref 80.0–100.0)
Monocytes Absolute: 0.7 10*3/uL (ref 0.1–1.0)
Monocytes Relative: 6 %
Neutro Abs: 9.4 10*3/uL — ABNORMAL HIGH (ref 1.7–7.7)
Neutrophils Relative %: 75 %
Platelet Count: 153 10*3/uL (ref 150–400)
RBC: 3.67 MIL/uL — ABNORMAL LOW (ref 3.87–5.11)
RDW: 12.6 % (ref 11.5–15.5)
WBC Count: 12.6 10*3/uL — ABNORMAL HIGH (ref 4.0–10.5)
nRBC: 0 % (ref 0.0–0.2)

## 2023-09-20 LAB — BASIC METABOLIC PANEL
Anion gap: 9 (ref 5–15)
BUN: 23 mg/dL (ref 8–23)
CO2: 26 mmol/L (ref 22–32)
Calcium: 8.6 mg/dL — ABNORMAL LOW (ref 8.9–10.3)
Chloride: 101 mmol/L (ref 98–111)
Creatinine, Ser: 0.66 mg/dL (ref 0.44–1.00)
GFR, Estimated: >60 mL/min (ref >60)
Glucose, Bld: 131 mg/dL — ABNORMAL HIGH (ref 70–99)
Potassium: 3.6 mmol/L (ref 3.5–5.1)
Sodium: 136 mmol/L (ref 135–145)

## 2023-09-20 LAB — IRON AND TIBC
Iron: 36 ug/dL (ref 28–170)
Saturation Ratios: 10 % — ABNORMAL LOW (ref 10.4–31.8)
TIBC: 374 ug/dL (ref 250–450)
UIBC: 338 ug/dL

## 2023-09-20 LAB — FERRITIN: Ferritin: 41 ng/mL (ref 11–307)

## 2023-09-20 NOTE — Progress Notes (Signed)
[facility] CONSULT NOTE  Patient Care Team: Beryle Flock Marzella Schlein, MD as PCP - General (Family Medicine) Lamar Blinks, MD as Consulting Physician (Cardiology) Jesusita Oka, MD as Consulting Physician (Dermatology) Yevonne Pax, MD as Consulting Physician (Internal Medicine) Antony Contras, MD as Consulting Physician (Ophthalmology) Tedd Sias, Marlana Salvage, MD as Physician Assistant (Endocrinology) Lonell Face, MD as Consulting Physician (Neurology) Fredderick Erb, MD as Attending Physician (Orthopedic Surgery) Ronney Asters Jackelyn Poling, RPH-CPP as Pharmacist Earna Coder, MD as Consulting Physician (Oncology)  CHIEF COMPLAINTS/PURPOSE OF CONSULTATION: ANEMIA  HEMATOLOGY HISTORY  # ANEMIA [Hb; MCV-platelets- WBC; Iron sat; ferritin;  GFR- CT/US; EGD- none; /colonoscopy-2022- Dr.Anna [GI]   Latest Reference Range & Units 10/26/22 13:39  Iron 27 - 139 ug/dL 15 (L)  UIBC 846 - 962 ug/dL 952 (H)  TIBC 841 - 324 ug/dL 401  Ferritin 15 - 027 ng/mL 14 (L)  Iron Saturation 15 - 55 % 4 (LL)  (LL): Data is critically low (L): Data is abnormally low (H): Data is abnormally high  HISTORY OF PRESENTING ILLNESS: in a wheel chair; with her sister, Joyce Gross.   Lori Brady 77 y.o. female pleasant patient iron deficient anemia-of unclear etiology is here for follow-up. In the interim she has had colonoscopy, endoscopy, and capsule study. She was previously taking 2 tablets and decreased to 1 due to GI upset & constipation. She feels well and denies worsening fatigue, shortness of breath. Denies black or bloody stools. No vaginal bleeding.   Review of Systems  Constitutional:  Negative for chills, diaphoresis, fever, malaise/fatigue and weight loss.  HENT:  Negative for nosebleeds.   Respiratory:  Negative for cough, hemoptysis, sputum production, shortness of breath and wheezing.   Cardiovascular:  Negative for chest pain, palpitations, orthopnea and leg swelling.   Gastrointestinal:  Negative for abdominal pain, blood in stool, constipation, diarrhea, heartburn, melena, nausea and vomiting.  Genitourinary:  Negative for hematuria.  Musculoskeletal:  Negative for back pain, falls and joint pain.  Skin:  Negative for itching and rash.  Neurological:  Negative for dizziness, tingling, focal weakness, weakness and headaches.  Endo/Heme/Allergies:  Does not bruise/bleed easily.  Psychiatric/Behavioral:  Negative for depression. The patient is not nervous/anxious and does not have insomnia.    MEDICAL HISTORY:  Past Medical History:  Diagnosis Date   Arthritis    Asthma    Crohn's disease (HCC)    Emphysema of lung (HCC)    GERD (gastroesophageal reflux disease)    Hip fracture (HCC) 01/26/2017   History of chicken pox    History of heart attack    Hx of completed stroke    Hypercholesteremia    Hypertension    Parkinson disease (HCC)    Pneumonia    Seasonal allergies    Sepsis (HCC)    Septic shock (HCC) 10/08/2016   UTI (lower urinary tract infection)    SURGICAL HISTORY: Past Surgical History:  Procedure Laterality Date   BIOPSY  07/20/2023   Procedure: BIOPSY;  Surgeon: Wyline Mood, MD;  Location: [facility] ENDOSCOPY;  Service: Gastroenterology;;   BREAST BIOPSY Bilateral 1960's to 90's   breast cysts     CATARACT EXTRACTION, BILATERAL     COLONOSCOPY WITH PROPOFOL N/A 06/01/2022   Procedure: COLONOSCOPY WITH PROPOFOL;  Surgeon: Wyline Mood, MD;  Location: [facility] ENDOSCOPY;  Service: Gastroenterology;  Laterality: N/A;   COLONOSCOPY WITH PROPOFOL N/A 04/19/2023   Procedure: COLONOSCOPY WITH PROPOFOL;  Surgeon: Wyline Mood, MD;  Location: [facility] ENDOSCOPY;  Service: Gastroenterology;  Laterality: N/A;   COLONOSCOPY WITH PROPOFOL N/A 07/20/2023   Procedure: COLONOSCOPY WITH PROPOFOL;  Surgeon: Wyline Mood, MD;  Location: [facility] ENDOSCOPY;  Service: Gastroenterology;  Laterality: N/A;   ELBOW SURGERY     right   ESOPHAGOGASTRODUODENOSCOPY (EGD) WITH  PROPOFOL N/A 04/19/2023   Procedure: ESOPHAGOGASTRODUODENOSCOPY (EGD) WITH PROPOFOL;  Surgeon: Wyline Mood, MD;  Location: [facility] ENDOSCOPY;  Service: Gastroenterology;  Laterality: N/A;   ESOPHAGOGASTRODUODENOSCOPY (EGD) WITH PROPOFOL N/A 07/20/2023   Procedure: ESOPHAGOGASTRODUODENOSCOPY (EGD) WITH PROPOFOL;  Surgeon: Wyline Mood, MD;  Location: [facility] ENDOSCOPY;  Service: Gastroenterology;  Laterality: N/A;   GIVENS CAPSULE STUDY N/A 08/03/2023   Procedure: GIVENS CAPSULE STUDY;  Surgeon: Wyline Mood, MD;  Location: [facility] ENDOSCOPY;  Service: Gastroenterology;  Laterality: N/A;   HOT HEMOSTASIS  07/20/2023   Procedure: HOT HEMOSTASIS (ARGON PLASMA COAGULATION/BICAP);  Surgeon: Wyline Mood, MD;  Location: [facility] ENDOSCOPY;  Service: Gastroenterology;;   POLYPECTOMY  07/20/2023   Procedure: POLYPECTOMY;  Surgeon: Wyline Mood, MD;  Location: [facility] ENDOSCOPY;  Service: Gastroenterology;;   right wrist fracture     TUBAL LIGATION     WRIST SURGERY Left    SOCIAL HISTORY: Social History   Socioeconomic History   Marital status: Widowed    Spouse name: Not on file   Number of children: 3   Years of education: Not on file   Highest education level: Some college, no degree  Occupational History   Occupation: retired  Tobacco Use   Smoking status: Former    Current packs/day: 0.00    Average packs/day: 1 pack/day for 30.0 years (30.0 ttl pk-yrs)    Types: Cigarettes    Start date: 08/01/1978    Quit date: 08/01/2008    Years since quitting: 15.1   Smokeless tobacco: Never  Vaping Use   Vaping status: Never Used  Substance and Sexual Activity   Alcohol use: Not Currently    Alcohol/week: 1.0 standard drink of alcohol    Types: 1 Shots of liquor per week   Drug use: No   Sexual activity: Not on file  Other Topics Concern   Not on file  Social History Narrative   Not on file   Social Determinants of Health   Financial Resource Strain: Low Risk  (02/02/2023)   Overall Financial Resource Strain  (CARDIA)    Difficulty of Paying Living Expenses: Not very hard  Food Insecurity: No Food Insecurity (02/02/2023)   Hunger Vital Sign    Worried About Running Out of Food in the Last Year: Never true    Ran Out of Food in the Last Year: Never true  Transportation Needs: No Transportation Needs (02/02/2023)   PRAPARE - Administrator, Civil Service (Medical): No    Lack of Transportation (Non-Medical): No  Physical Activity: Insufficiently Active (02/02/2023)   Exercise Vital Sign    Days of Exercise per Week: 3 days    Minutes of Exercise per Session: 20 min  Stress: No Stress Concern Present (02/02/2023)   [employer] of Occupational Health - Occupational Stress Questionnaire    Feeling of Stress : Not at all  Social Connections: Unknown (02/02/2023)   Social Connection and Isolation Panel [NHANES]    Frequency of Communication with Friends and Family: More than three times a week    Frequency of Social Gatherings with Friends and Family: Once a week    Attends Religious Services: Not on Insurance claims handler of Clubs or Organizations: No  Attends Banker Meetings: Never    Marital Status: Widowed  Intimate Partner Violence: Not At Risk (02/03/2023)   Humiliation, Afraid, Rape, and Kick questionnaire    Fear of Current or Ex-Partner: No    Emotionally Abused: No    Physically Abused: No    Sexually Abused: No    FAMILY HISTORY: Family History  Problem Relation Age of Onset   Cancer Sister        ovarian/uterine/breast   Breast cancer Sister 30   Cancer Sister        breast   Heart disease Sister    Breast cancer Sister 25   Heart disease Father    Cancer Father        liver   Pancreatic cancer Mother    Heart disease Brother    Heart attack Brother    Cancer Brother        liver   Heart disease Sister    Diabetes Maternal Grandmother     ALLERGIES:  is allergic to levofloxacin and vicodin [hydrocodone-acetaminophen].  MEDICATIONS:   Current Outpatient Medications  Medication Sig Dispense Refill   albuterol (ACCUNEB) 0.63 MG/3ML nebulizer solution Take 3 mLs (0.63 mg total) by nebulization every 6 (six) hours as needed for wheezing. 360 mL 1   albuterol (ACCUNEB) 0.63 MG/3ML nebulizer solution Inhale 1 vial via nebulizer every 6 hours as needed for wheezing 360 mL 1   albuterol (VENTOLIN HFA) 108 (90 Base) MCG/ACT inhaler Inhale 2 puffs into the lungs every 6 (six) hours as needed for wheezing or shortness of breath. 18 g 1   alendronate (FOSAMAX) 70 MG tablet Take 1 tablet (70 mg total) by mouth once a week -take on an empty stomach before breakfast with 8 oz of water. Remaine upright for 30 minutes. 12 tablet 1   aspirin 81 MG EC tablet Take 81 mg by mouth daily with breakfast.     atorvastatin (LIPITOR) 40 MG tablet Take 1 tablet (40 mg total) by mouth daily. 90 tablet 1   azelastine (ASTELIN) 0.1 % nasal spray Place 2 sprays into both nostrils daily at 6 (six) AM.     Budeson-Glycopyrrol-Formoterol (BREZTRI AEROSPHERE) 160-9-4.8 MCG/ACT AERO Inhale 2 puffs into the lungs in the morning and at bedtime. Getting from AZ&ME     carbidopa-levodopa (SINEMET CR) 50-200 MG tablet Take 1 tablet by mouth at bedtime.     carbidopa-levodopa (SINEMET CR) 50-200 MG tablet Take 1 tablet by mouth at bedtime 90 tablet 3   carbidopa-levodopa (SINEMET IR) 25-100 MG tablet Take 3 tablets with breakfast, 2 tablets with lunch, 2 tablets with supper.     carbidopa-levodopa (SINEMET IR) 25-100 MG tablet Take 3 tablets every morning AND 2 tablets 2 (two) times daily. 210 tablet 11   cetirizine (ZYRTEC) 10 MG tablet Take 1 tablet (10 mg total) by mouth daily. 30 tablet 1   Cholecalciferol (VITAMIN D) 2000 UNITS CAPS Take 1 capsule by mouth daily.     citalopram (CELEXA) 20 MG tablet Take 1 tablet (20 mg total) by mouth daily. 90 tablet 1   ferrous sulfate 325 (65 FE) MG EC tablet Take 650 mg by mouth daily with breakfast.     melatonin 5 MG TABS  Take 1 tablet by mouth at bedtime.     montelukast (SINGULAIR) 10 MG tablet Take 1 tablet (10 mg total) by mouth every morning. 90 tablet 1   omeprazole (PRILOSEC) 40 MG capsule Take 1 capsule (40 mg total) by  mouth at bedtime. 90 capsule 3   sodium fluoride (PREVIDENT 5000 DRY MOUTH) 1.1 % GEL dental gel Use as directed 100 mL 5   Sodium Sulfate-Mag Sulfate-KCl (SUTAB) [phone] MG TABS At 5 PM take 12 tablets using the 8 oz cup provided in the kit drinking 5 cups of water and 5 hours before your procedure repeat the same process. 24 tablet 0   No current facility-administered medications for this visit.     PHYSICAL EXAMINATION: Vitals:   09/20/23 1351  BP: 115/74  Pulse: 78  Temp: 98.6 F (37 C)  SpO2: 98%   Filed Weights   09/20/23 1351  Weight: 129 lb 6.4 oz (58.7 kg)   Physical Exam Vitals and nursing note reviewed.  Constitutional:      Appearance: She is not ill-appearing.  HENT:     Head: Normocephalic and atraumatic.  Cardiovascular:     Rate and Rhythm: Normal rate and regular rhythm.  Pulmonary:     Comments: Decreased breath sounds bilaterally.  Abdominal:     General: There is no distension.     Palpations: Abdomen is soft.  Musculoskeletal:        General: No deformity.  Skin:    General: Skin is warm.     Coloration: Skin is not pale.  Neurological:     Mental Status: She is alert and oriented to person, place, and time.  Psychiatric:        Mood and Affect: Mood normal.        Behavior: Behavior normal.      LABORATORY DATA:  I have reviewed the data as listed Lab Results  Component Value Date   WBC 12.6 (H) 09/20/2023   HGB 11.3 (L) 09/20/2023   HCT 35.4 (L) 09/20/2023   MCV 96.5 09/20/2023   PLT 153 09/20/2023   Recent Labs    10/26/22 1339 12/21/22 1241 03/22/23 1355 09/20/23 1340  NA 142 138 139 136  K 4.4 3.9 3.7 3.6  CL 102 102 102 101  CO2 24 26 26 26   GLUCOSE 93 94 105* 131*  BUN 11 16 17 23   CREATININE 0.72 0.74 0.52  0.66  CALCIUM 9.2 9.1 9.0 8.6*  GFRNONAA  --  >60 >60 >60  PROT 6.7  --  7.5  --   ALBUMIN 4.2  --  4.1  --   AST 19  --  30  --   ALT 9  --  7  --   ALKPHOS 101  --  127*  --   BILITOT 0.5  --  0.9  --    Iron/TIBC/Ferritin/ %Sat    Component Value Date/Time   IRON 58 03/22/2023 1355   IRON 15 (L) 10/26/2022 1339   TIBC 342 03/22/2023 1355   TIBC 415 10/26/2022 1339   FERRITIN 69 03/22/2023 1355   FERRITIN 14 (L) 10/26/2022 1339   IRONPCTSAT 17 03/22/2023 1355   IRONPCTSAT 4 (LL) 10/26/2022 1339     MM 3D SCREENING MAMMOGRAM BILATERAL BREAST  Result Date: 09/08/2023 CLINICAL DATA:  Screening. EXAM: DIGITAL SCREENING BILATERAL MAMMOGRAM WITH TOMOSYNTHESIS AND CAD TECHNIQUE: Bilateral screening digital craniocaudal and mediolateral oblique mammograms were obtained. Bilateral screening digital breast tomosynthesis was performed. The images were evaluated with computer-aided detection. COMPARISON:  Previous exam(s). ACR Breast Density Category b: There are scattered areas of fibroglandular density. FINDINGS: There are no findings suspicious for malignancy. IMPRESSION: No mammographic evidence of malignancy. A result letter of this screening mammogram will be mailed directly  to the patient. RECOMMENDATION: Screening mammogram in one year. (Code:SM-B-01Y) BI-RADS CATEGORY  1: Negative. Electronically Signed   By: Hulan Saas M.D.   On: 09/08/2023 10:12   DG Bone Density  Result Date: 09/06/2023 EXAM: DUAL X-RAY ABSORPTIOMETRY (DXA) FOR BONE MINERAL DENSITY IMPRESSION: Your patient Lori Brady completed a BMD test on 09/06/2023 using the [employer] iDXA DXA System (software version: 14.10) manufactured by [employer]. The following summarizes the results of our evaluation. Technologist: SCE PATIENT BIOGRAPHICAL: Name: Renisha, Cockrum Patient ID: [id number] Birth Date: [birth year] Height: 61.0 in. Gender: Female Exam Date: 09/06/2023 Weight: 126.0 lbs. Indications: Advanced Age,  asthma, Caucasian, COPD, Family Hx of Osteoporosis, Height Loss, History of Fracture (Adult), Parkinson's disease, Postmenopausal, Vitamin D Deficiency Fractures: Forearm Right, Humerus Right, Pelvis, Wrist Left Treatments: Breztri, Prilosec, Vitamin D, ZYRTEC DENSITOMETRY RESULTS: Site      Region      Measured Date Measured Age WHO Classification Young Adult T-score BMD         %Change vs. Previous Significant Change (*) AP Spine L1-L4 09/06/2023 75.4 Osteopenia -1.5 1.011 g/cm2 20.9% Yes AP Spine L1-L4 06/17/2016 68.2 Osteoporosis -2.9 0.836 g/cm2 - - DualFemur Total Right 09/06/2023 75.4 Osteoporosis -2.8 0.655 g/cm2 -2.1% - DualFemur Total Right 06/17/2016 68.2 Osteoporosis -2.7 0.669 g/cm2 - - DualFemur Total Mean 09/06/2023 75.4 Osteoporosis -2.7 0.661 g/cm2 -3.4% Yes DualFemur Total Mean 06/17/2016 68.2 Osteoporosis -2.6 0.684 g/cm2 - - ASSESSMENT: The BMD measured at Femur Total Right is 0.655 g/cm2 with a T-score of -2.8. This patient is considered osteoporotic according to World Health Organization [facility]) criteria. The scan quality is good. Compared with prior study, there has been a significant increase in the spine. Compared with prior study, there has been a significant decrease in the total hip. World Science writer [facility]) criteria for post-menopausal, Caucasian Women: Normal:                   T-score at or above -1 SD Osteopenia/low bone mass: T-score between -1 and -2.5 SD Osteoporosis:             T-score at or below -2.5 SD RECOMMENDATIONS: 1. All patients should optimize calcium and vitamin D intake. 2. Consider FDA-approved medical therapies in postmenopausal women and men aged 61 years and older, based on the following: a. A hip or vertebral(clinical or morphometric) fracture b. T-score < -2.5 at the femoral neck or spine after appropriate evaluation to exclude secondary causes c. Low bone mass (T-score between -1.0 and -2.5 at the femoral neck or spine) and a 10-year probability of a hip  fracture > 3% or a 10-year probability of a major osteoporosis-related fracture > 20% based on the [place]-adapted WHO algorithm 3. Clinician judgment and/or patient preferences may indicate treatment for people with 10-year fracture probabilities above or below these levels FOLLOW-UP: People with diagnosed cases of osteoporosis or at high risk for fracture should have regular bone mineral density tests. For patients eligible for Medicare, routine testing is allowed once every 2 years. The testing frequency can be increased to one year for patients who have rapidly progressing disease, those who are receiving or discontinuing medical therapy to restore bone mass, or have additional risk factors. I have reviewed this report, and agree with the above findings. [facility] Electronically Signed   By: Frederico Hamman M.D.   On: 09/06/2023 13:30    ASSESSMENT & PLAN:   # Iron deficient anemia: [iron sat- 4%; LOW ferritin; Hb 8.3-  DEC 2023-PCP.  Currently on  PO iron BID. Self reduced to once a day d/t constipation. She has not required IV iron. Today- hemoglobin 11.3. Down. Prefers to hold off on IV iron. Iron studies pending. If decreased recommend increasing iron to 2 tablets. Can trial gentle iron which may have fewer to no GI side effects.     # Etiology of iron deficiency: Unclear; prior GI evaluation-/ Dr.Anna- colonoscopy [[id number] WNL]. Colonoscopy 07/20/23- unremarkable. EGD- medium sized hiatal hernia. Gastritis. Benign pathology. Capsule study 08/03/23- normal. UA negative for blood in Dec 2023. Reviewed that hiatal hernia may be contributing/malabsorption.   # History of Parkinson disease/COPD-[stroke 2007-vocal cord paralysis]stable.   # DISPOSITION: # No venofer today # follow up in 6 months- labs (cbc, bmp, ferritin, iron studies), Dr Donneta Romberg, +/- venofer- la  No problem-specific Assessment & Plan notes found for this encounter.  All questions were answered. The patient knows  to call the clinic with any problems, questions or concerns.   Alinda Dooms, NP 09/20/2023

## 2023-09-22 ENCOUNTER — Other Ambulatory Visit: Payer: Medicare PPO

## 2023-09-22 ENCOUNTER — Ambulatory Visit: Payer: Medicare PPO

## 2023-09-22 ENCOUNTER — Ambulatory Visit: Payer: Medicare PPO | Admitting: Internal Medicine

## 2023-09-27 ENCOUNTER — Other Ambulatory Visit: Payer: Self-pay

## 2023-09-29 ENCOUNTER — Other Ambulatory Visit: Payer: Self-pay

## 2023-10-05 ENCOUNTER — Other Ambulatory Visit: Payer: Self-pay

## 2023-10-07 ENCOUNTER — Other Ambulatory Visit (HOSPITAL_COMMUNITY): Payer: Self-pay

## 2023-10-08 ENCOUNTER — Other Ambulatory Visit: Payer: Self-pay

## 2023-10-11 ENCOUNTER — Other Ambulatory Visit (HOSPITAL_COMMUNITY): Payer: Self-pay

## 2023-10-11 ENCOUNTER — Other Ambulatory Visit: Payer: Self-pay

## 2023-10-11 ENCOUNTER — Encounter: Payer: Self-pay | Admitting: Nurse Practitioner

## 2023-10-11 ENCOUNTER — Ambulatory Visit: Payer: Medicare PPO | Admitting: Nurse Practitioner

## 2023-10-11 ENCOUNTER — Encounter: Payer: Self-pay | Admitting: Internal Medicine

## 2023-10-11 VITALS — BP 159/67 | HR 87 | Temp 98.4°F | Resp 16 | Ht 61.0 in | Wt 129.0 lb

## 2023-10-11 DIAGNOSIS — K219 Gastro-esophageal reflux disease without esophagitis: Secondary | ICD-10-CM

## 2023-10-11 DIAGNOSIS — J4489 Other specified chronic obstructive pulmonary disease: Secondary | ICD-10-CM | POA: Diagnosis not present

## 2023-10-11 DIAGNOSIS — R0602 Shortness of breath: Secondary | ICD-10-CM

## 2023-10-11 MED ORDER — IPRATROPIUM-ALBUTEROL 0.5-2.5 (3) MG/3ML IN SOLN
3.0000 mL | RESPIRATORY_TRACT | 3 refills | Status: AC | PRN
  Filled 2023-10-11 (×2): qty 360, 20d supply, fill #0

## 2023-10-11 MED ORDER — OMEPRAZOLE 40 MG PO CPDR
40.0000 mg | DELAYED_RELEASE_CAPSULE | Freq: Two times a day (BID) | ORAL | 3 refills | Status: DC
  Filled 2023-10-11: qty 60, 30d supply, fill #0
  Filled 2023-11-03 – 2023-11-08 (×3): qty 60, 30d supply, fill #1
  Filled 2023-11-22 – 2023-12-01 (×2): qty 60, 30d supply, fill #2
  Filled 2023-12-28: qty 60, 30d supply, fill #3
  Filled 2024-01-11 – 2024-01-20 (×2): qty 60, 30d supply, fill #4
  Filled 2024-01-28 – 2024-02-16 (×2): qty 60, 30d supply, fill #5
  Filled 2024-03-06 – 2024-03-14 (×2): qty 60, 30d supply, fill #6
  Filled 2024-03-31 – 2024-04-11 (×2): qty 60, 30d supply, fill #7
  Filled 2024-05-23: qty 60, 30d supply, fill #8
  Filled 2024-06-22 (×2): qty 60, 30d supply, fill #9
  Filled 2024-07-25 (×2): qty 60, 30d supply, fill #10
  Filled 2024-08-22 – 2024-08-30 (×2): qty 60, 30d supply, fill #11

## 2023-10-11 NOTE — Progress Notes (Signed)
[facility] [address] [place], [place] [address]  Internal MEDICINE  Office Visit Note  Patient Name: Lori Brady  [id number]  [id number]  Date of Service: 10/11/2023  Chief Complaint  Patient presents with   Follow-up    HPI Lori Brady presents for a follow-up visit for COPD, SOB and GERD. COPD -- taking breztri inhaler, still working well for her. Was able to get financial assistance through manufacturer. Using rescue inhaler approx twice daily as needed which is decreased from 3 times daily.   Has a nebulizer and has been using it more.  Elevated blood pressure reading. Rechecked and improved.  SOB -- using albuterol inhaler 2x daily. Using nebulizer more now but only has albuterol neb solution . moderate obstructive lung disease and mild restrictive lung disease.  GERD -- not controlled with omeprazole once daily at bedtime.     Current Medication: Outpatient Encounter Medications as of 10/11/2023  Medication Sig   albuterol (VENTOLIN HFA) 108 (90 Base) MCG/ACT inhaler Inhale 2 puffs into the lungs every 6 (six) hours as needed for wheezing or shortness of breath.   alendronate (FOSAMAX) 70 MG tablet Take 1 tablet (70 mg total) by mouth once a week -take on an empty stomach before breakfast with 8 oz of water. Remaine upright for 30 minutes.   aspirin 81 MG EC tablet Take 81 mg by mouth daily with breakfast.   atorvastatin (LIPITOR) 40 MG tablet Take 1 tablet (40 mg total) by mouth daily.   azelastine (ASTELIN) 0.1 % nasal spray Place 2 sprays into both nostrils daily at 6 (six) AM.   Budeson-Glycopyrrol-Formoterol (BREZTRI AEROSPHERE) 160-9-4.8 MCG/ACT AERO Inhale 2 puffs into the lungs in the morning and at bedtime. Getting from [employer]   carbidopa-levodopa (SINEMET CR) 50-200 MG tablet Take 1 tablet by mouth at bedtime.   carbidopa-levodopa (SINEMET CR) 50-200 MG tablet Take 1 tablet by mouth at bedtime   carbidopa-levodopa (SINEMET IR) 25-100 MG tablet Take 3  tablets with breakfast, 2 tablets with lunch, 2 tablets with supper.   carbidopa-levodopa (SINEMET IR) 25-100 MG tablet Take 3 tablets every morning AND 2 tablets 2 (two) times daily.   cetirizine (ZYRTEC) 10 MG tablet Take 1 tablet (10 mg total) by mouth daily.   Cholecalciferol (VITAMIN D) 2000 UNITS CAPS Take 1 capsule by mouth daily.   citalopram (CELEXA) 20 MG tablet Take 1 tablet (20 mg total) by mouth daily.   ferrous sulfate 325 (65 FE) MG EC tablet Take 650 mg by mouth daily with breakfast.   ipratropium-albuterol (DUONEB) 0.5-2.5 (3) MG/3ML SOLN Take 3 mLs by nebulization every 4 (four) hours as needed.   melatonin 5 MG TABS Take 1 tablet by mouth at bedtime.   montelukast (SINGULAIR) 10 MG tablet Take 1 tablet (10 mg total) by mouth every morning.   sodium fluoride (PREVIDENT 5000 DRY MOUTH) 1.1 % GEL dental gel Use as directed   Sodium Sulfate-Mag Sulfate-KCl (SUTAB) [id number] MG TABS At 5 PM take 12 tablets using the 8 oz cup provided in the kit drinking 5 cups of water and 5 hours before your procedure repeat the same process.   [DISCONTINUED] albuterol (ACCUNEB) 0.63 MG/3ML nebulizer solution Take 3 mLs (0.63 mg total) by nebulization every 6 (six) hours as needed for wheezing.   [DISCONTINUED] albuterol (ACCUNEB) 0.63 MG/3ML nebulizer solution Inhale 1 vial via nebulizer every 6 hours as needed for wheezing   [DISCONTINUED] omeprazole (PRILOSEC) 40 MG capsule Take 1 capsule (40 mg total) by  mouth at bedtime.   omeprazole (PRILOSEC) 40 MG capsule Take 1 capsule (40 mg total) by mouth in the morning and at bedtime.   No facility-administered encounter medications on file as of 10/11/2023.    Surgical History: Past Surgical History:  Procedure Laterality Date   BIOPSY  07/20/2023   Procedure: BIOPSY;  Surgeon: Wyline Mood, MD;  Location: [facility] ENDOSCOPY;  Service: Gastroenterology;;   BREAST BIOPSY Bilateral 1960's to 90's   breast cysts     CATARACT EXTRACTION, BILATERAL      COLONOSCOPY WITH PROPOFOL N/A 06/01/2022   Procedure: COLONOSCOPY WITH PROPOFOL;  Surgeon: Wyline Mood, MD;  Location: [facility] ENDOSCOPY;  Service: Gastroenterology;  Laterality: N/A;   COLONOSCOPY WITH PROPOFOL N/A 04/19/2023   Procedure: COLONOSCOPY WITH PROPOFOL;  Surgeon: Wyline Mood, MD;  Location: [facility] ENDOSCOPY;  Service: Gastroenterology;  Laterality: N/A;   COLONOSCOPY WITH PROPOFOL N/A 07/20/2023   Procedure: COLONOSCOPY WITH PROPOFOL;  Surgeon: Wyline Mood, MD;  Location: [facility] ENDOSCOPY;  Service: Gastroenterology;  Laterality: N/A;   ELBOW SURGERY     right   ESOPHAGOGASTRODUODENOSCOPY (EGD) WITH PROPOFOL N/A 04/19/2023   Procedure: ESOPHAGOGASTRODUODENOSCOPY (EGD) WITH PROPOFOL;  Surgeon: Wyline Mood, MD;  Location: [facility] ENDOSCOPY;  Service: Gastroenterology;  Laterality: N/A;   ESOPHAGOGASTRODUODENOSCOPY (EGD) WITH PROPOFOL N/A 07/20/2023   Procedure: ESOPHAGOGASTRODUODENOSCOPY (EGD) WITH PROPOFOL;  Surgeon: Wyline Mood, MD;  Location: [facility] ENDOSCOPY;  Service: Gastroenterology;  Laterality: N/A;   GIVENS CAPSULE STUDY N/A 08/03/2023   Procedure: GIVENS CAPSULE STUDY;  Surgeon: Wyline Mood, MD;  Location: [facility] ENDOSCOPY;  Service: Gastroenterology;  Laterality: N/A;   HOT HEMOSTASIS  07/20/2023   Procedure: HOT HEMOSTASIS (ARGON PLASMA COAGULATION/BICAP);  Surgeon: Wyline Mood, MD;  Location: [facility] ENDOSCOPY;  Service: Gastroenterology;;   POLYPECTOMY  07/20/2023   Procedure: POLYPECTOMY;  Surgeon: Wyline Mood, MD;  Location: [facility] ENDOSCOPY;  Service: Gastroenterology;;   right wrist fracture     TUBAL LIGATION     WRIST SURGERY Left     Medical History: Past Medical History:  Diagnosis Date   Arthritis    Asthma    Crohn's disease (HCC)    Emphysema of lung (HCC)    GERD (gastroesophageal reflux disease)    Hip fracture (HCC) 01/26/2017   History of chicken pox    History of heart attack    Hx of completed stroke    Hypercholesteremia    Hypertension    Parkinson disease (HCC)     Pneumonia    Seasonal allergies    Sepsis (HCC)    Septic shock (HCC) 10/08/2016   UTI (lower urinary tract infection)     Family History: Family History  Problem Relation Age of Onset   Cancer Sister        ovarian/uterine/breast   Breast cancer Sister 63   Cancer Sister        breast   Heart disease Sister    Breast cancer Sister 44   Heart disease Father    Cancer Father        liver   Pancreatic cancer Mother    Heart disease Brother    Heart attack Brother    Cancer Brother        liver   Heart disease Sister    Diabetes Maternal Grandmother     Social History   Socioeconomic History   Marital status: Widowed    Spouse name: Not on file   Number of children: 3   Years of education: Not on file   Highest  education level: Some college, no degree  Occupational History   Occupation: retired  Tobacco Use   Smoking status: Former    Current packs/day: 0.00    Average packs/day: 1 pack/day for 30.0 years (30.0 ttl pk-yrs)    Types: Cigarettes    Start date: 08/01/1978    Quit date: 08/01/2008    Years since quitting: 15.2   Smokeless tobacco: Never  Vaping Use   Vaping status: Never Used  Substance and Sexual Activity   Alcohol use: Not Currently    Alcohol/week: 1.0 standard drink of alcohol    Types: 1 Shots of liquor per week   Drug use: No   Sexual activity: Not on file  Other Topics Concern   Not on file  Social History Narrative   Not on file   Social Determinants of Health   Financial Resource Strain: Low Risk  (02/02/2023)   Overall Financial Resource Strain (CARDIA)    Difficulty of Paying Living Expenses: Not very hard  Food Insecurity: No Food Insecurity (02/02/2023)   Hunger Vital Sign    Worried About Running Out of Food in the Last Year: Never true    Ran Out of Food in the Last Year: Never true  Transportation Needs: No Transportation Needs (02/02/2023)   PRAPARE - Administrator, Civil Service (Medical): No    Lack of  Transportation (Non-Medical): No  Physical Activity: Insufficiently Active (02/02/2023)   Exercise Vital Sign    Days of Exercise per Week: 3 days    Minutes of Exercise per Session: 20 min  Stress: No Stress Concern Present (02/02/2023)   [employer] of Occupational Health - Occupational Stress Questionnaire    Feeling of Stress : Not at all  Social Connections: Unknown (02/02/2023)   Social Connection and Isolation Panel [NHANES]    Frequency of Communication with Friends and Family: More than three times a week    Frequency of Social Gatherings with Friends and Family: Once a week    Attends Religious Services: Not on Insurance claims handler of Clubs or Organizations: No    Attends Banker Meetings: Never    Marital Status: Widowed  Intimate Partner Violence: Not At Risk (02/03/2023)   Humiliation, Afraid, Rape, and Kick questionnaire    Fear of Current or Ex-Partner: No    Emotionally Abused: No    Physically Abused: No    Sexually Abused: No      Review of Systems  Constitutional:  Negative for chills, fatigue and unexpected weight change.  HENT:  Negative for congestion, postnasal drip, rhinorrhea, sneezing and sore throat.   Eyes:  Negative for redness.  Respiratory:  Positive for shortness of breath (intermittent). Negative for cough, chest tightness and wheezing.   Cardiovascular: Negative.  Negative for chest pain and palpitations.  Gastrointestinal:  Negative for abdominal pain, constipation, diarrhea, nausea and vomiting.  Genitourinary:  Negative for dysuria and frequency.  Musculoskeletal:  Negative for arthralgias, back pain, joint swelling and neck pain.  Skin:  Negative for rash.  Neurological: Negative.  Negative for tremors and numbness.  Hematological:  Negative for adenopathy. Does not bruise/bleed easily.  Psychiatric/Behavioral:  Negative for behavioral problems (Depression), sleep disturbance and suicidal ideas. The patient is not  nervous/anxious.     Vital Signs: BP (!) 159/67   Pulse 87   Temp 98.4 F (36.9 C)   Resp 16   Ht 5\' 1"  (1.549 m)   Wt 129 lb (58.5 kg)  SpO2 98%   BMI 24.37 kg/m    Physical Exam Vitals reviewed.  Constitutional:      General: She is not in acute distress.    Appearance: Normal appearance. She is not ill-appearing.  HENT:     Head: Normocephalic and atraumatic.  Eyes:     Pupils: Pupils are equal, round, and reactive to light.  Cardiovascular:     Rate and Rhythm: Normal rate and regular rhythm.     Heart sounds: Normal heart sounds. No murmur heard. Pulmonary:     Effort: Pulmonary effort is normal. No accessory muscle usage or respiratory distress.     Breath sounds: Transmitted upper airway sounds present. No wheezing.  Neurological:     Mental Status: She is alert and oriented to person, place, and time.  Psychiatric:        Mood and Affect: Mood normal.        Behavior: Behavior normal.        Assessment/Plan: 1. COPD (chronic obstructive pulmonary disease) with chronic bronchitis (HCC) PFT in 6 months then follow up in office for results. Switch neb treatment to duoneb solution.  - ipratropium-albuterol (DUONEB) 0.5-2.5 (3) MG/3ML SOLN; Take 3 mLs by nebulization every 4 (four) hours as needed.  Dispense: 360 mL; Refill: 3 - Pulmonary function test; Future  2. SOB (shortness of breath) PFT ordered and duoneb solution.  - ipratropium-albuterol (DUONEB) 0.5-2.5 (3) MG/3ML SOLN; Take 3 mLs by nebulization every 4 (four) hours as needed.  Dispense: 360 mL; Refill: 3 - Pulmonary function test; Future  3. Gastroesophageal reflux disease without esophagitis Increase omeprazole to twice daily.  - omeprazole (PRILOSEC) 40 MG capsule; Take 1 capsule (40 mg total) by mouth in the morning and at bedtime.  Dispense: 180 capsule; Refill: 3    General Counseling: Delesia verbalizes understanding of the findings of todays visit and agrees with plan of treatment. I  have discussed any further diagnostic evaluation that may be needed or ordered today. We also reviewed her medications today. she has been encouraged to call the office with any questions or concerns that should arise related to todays visit.    Orders Placed This Encounter  Procedures   Pulmonary function test    Meds ordered this encounter  Medications   omeprazole (PRILOSEC) 40 MG capsule    Sig: Take 1 capsule (40 mg total) by mouth in the morning and at bedtime.    Dispense:  180 capsule    Refill:  3    Adherence packaging please. Thank you. Note increased frequency to twice daily thank you   ipratropium-albuterol (DUONEB) 0.5-2.5 (3) MG/3ML SOLN    Sig: Take 3 mLs by nebulization every 4 (four) hours as needed.    Dispense:  360 mL    Refill:  3    Discontinue albuterol neb solution and fill new script instead.    Return in about 6 months (around 04/09/2024) for F/U, PFT @ [place] and then pulm visit with DSK or Nicky Milhouse after PFT done .   Total time spent:30 Minutes Time spent includes review of chart, medications, test results, and follow up plan with the patient.   [place] Controlled Substance Database was reviewed by me.  This patient was seen by Sallyanne Kuster, FNP-C in collaboration with Dr. Beverely Risen as a part of collaborative care agreement.   Sally-Ann Cutbirth R. Tedd Sias, MSN, FNP-C Internal medicine

## 2023-10-12 ENCOUNTER — Other Ambulatory Visit: Payer: Self-pay

## 2023-10-13 ENCOUNTER — Other Ambulatory Visit (HOSPITAL_COMMUNITY): Payer: Self-pay

## 2023-10-19 DIAGNOSIS — C44311 Basal cell carcinoma of skin of nose: Secondary | ICD-10-CM | POA: Diagnosis not present

## 2023-10-21 ENCOUNTER — Other Ambulatory Visit: Payer: Self-pay | Admitting: Family Medicine

## 2023-10-21 DIAGNOSIS — R0602 Shortness of breath: Secondary | ICD-10-CM

## 2023-10-21 DIAGNOSIS — J4489 Other specified chronic obstructive pulmonary disease: Secondary | ICD-10-CM

## 2023-10-21 NOTE — Telephone Encounter (Signed)
Medication Refill -  Most Recent Primary Care Visit:  Provider: Erasmo Downer  Department: [facility]  Visit Type: NURSE VISIT  Date: 08/17/2023  Medication:  albuterol (VENTOLIN HFA) 108 (90 Base) MCG/ACT inhaler    Has the patient contacted their pharmacy? No  Is this the correct pharmacy for this prescription? Yes  This is the patient's preferred pharmacy: [facility] [address] [place] [address] Phone: [phone] Fax: [phone]  Has the prescription been filled recently? Yes  Is the patient out of the medication? Yes  Has the patient been seen for an appointment in the last year OR does the patient have an upcoming appointment? Yes  Can we respond through MyChart? No  Agent: Please be advised that Rx refills may take up to 3 business days. We ask that you follow-up with your pharmacy.

## 2023-10-22 NOTE — Telephone Encounter (Signed)
Requested medication (s) are due for refill today: yes}  Requested medication (s) are on the active medication list: yes  Last refill:  09/06/23 18 g 1 RF  Future visit scheduled: yes  Notes to clinic:  med was previously prescribed by another provider at another practice Sallyanne Kuster NP   Requested Prescriptions  Pending Prescriptions Disp Refills   albuterol (VENTOLIN HFA) 108 (90 Base) MCG/ACT inhaler 18 g 1    Sig: Inhale 2 puffs into the lungs every 6 (six) hours as needed for wheezing or shortness of breath.     Pulmonology:  Beta Agonists 2 Failed - 10/21/2023  3:57 PM      Failed - Last BP in normal range    BP Readings from Last 1 Encounters:  10/11/23 (!) 159/67         Passed - Last Heart Rate in normal range    Pulse Readings from Last 1 Encounters:  10/11/23 87         Passed - Valid encounter within last 12 months    Recent Outpatient Visits           2 months ago Need for shingles vaccine   [facility] [place], Marzella Schlein, MD   4 months ago Encounter for annual wellness visit (AWV) in Medicare patient   [facility] [place], Marzella Schlein, MD   10 months ago Benign essential HTN   [facility] [place], Marzella Schlein, MD   12 months ago Benign essential HTN   [facility] [place], Marzella Schlein, MD   1 year ago Encounter for annual physical exam   [facility] [place], Marzella Schlein, MD       Future Appointments             In 1 month [place], Marzella Schlein, MD [facility]

## 2023-10-25 ENCOUNTER — Encounter: Payer: Self-pay | Admitting: Internal Medicine

## 2023-10-25 ENCOUNTER — Other Ambulatory Visit: Payer: Self-pay

## 2023-10-25 ENCOUNTER — Other Ambulatory Visit (HOSPITAL_COMMUNITY): Payer: Self-pay

## 2023-10-25 MED ORDER — ALBUTEROL SULFATE HFA 108 (90 BASE) MCG/ACT IN AERS
2.0000 | INHALATION_SPRAY | Freq: Four times a day (QID) | RESPIRATORY_TRACT | 1 refills | Status: DC | PRN
  Filled 2023-10-25: qty 18, 25d supply, fill #0
  Filled 2024-01-19: qty 18, 25d supply, fill #1

## 2023-10-29 ENCOUNTER — Other Ambulatory Visit: Payer: Self-pay

## 2023-11-02 DIAGNOSIS — G4752 REM sleep behavior disorder: Secondary | ICD-10-CM | POA: Diagnosis not present

## 2023-11-02 DIAGNOSIS — Z8673 Personal history of transient ischemic attack (TIA), and cerebral infarction without residual deficits: Secondary | ICD-10-CM | POA: Diagnosis not present

## 2023-11-02 DIAGNOSIS — G20C Parkinsonism, unspecified: Secondary | ICD-10-CM | POA: Diagnosis not present

## 2023-11-02 DIAGNOSIS — R2689 Other abnormalities of gait and mobility: Secondary | ICD-10-CM | POA: Diagnosis not present

## 2023-11-03 ENCOUNTER — Encounter: Payer: Self-pay | Admitting: Internal Medicine

## 2023-11-03 ENCOUNTER — Other Ambulatory Visit: Payer: Self-pay | Admitting: Nurse Practitioner

## 2023-11-03 ENCOUNTER — Other Ambulatory Visit: Payer: Self-pay

## 2023-11-03 DIAGNOSIS — J301 Allergic rhinitis due to pollen: Secondary | ICD-10-CM

## 2023-11-03 MED ORDER — CETIRIZINE HCL 10 MG PO TABS
10.0000 mg | ORAL_TABLET | Freq: Every day | ORAL | 1 refills | Status: DC
  Filled 2023-11-03 – 2023-11-08 (×3): qty 30, 30d supply, fill #0
  Filled 2023-11-22 – 2023-12-01 (×2): qty 30, 30d supply, fill #1

## 2023-11-04 ENCOUNTER — Other Ambulatory Visit: Payer: Self-pay

## 2023-11-04 ENCOUNTER — Encounter: Payer: Self-pay | Admitting: Internal Medicine

## 2023-11-05 ENCOUNTER — Telehealth: Payer: Self-pay

## 2023-11-08 ENCOUNTER — Encounter: Payer: Self-pay | Admitting: Internal Medicine

## 2023-11-08 ENCOUNTER — Other Ambulatory Visit (HOSPITAL_COMMUNITY): Payer: Self-pay

## 2023-11-08 ENCOUNTER — Other Ambulatory Visit: Payer: Self-pay

## 2023-11-08 NOTE — Telephone Encounter (Signed)
Spoke with pt feeling better she said she taking Mucinex  and advised that use her inhlaer and used nebulizer as needed

## 2023-11-22 ENCOUNTER — Other Ambulatory Visit: Payer: Self-pay

## 2023-11-24 ENCOUNTER — Other Ambulatory Visit: Payer: Self-pay

## 2023-11-26 ENCOUNTER — Other Ambulatory Visit: Payer: Self-pay

## 2023-11-29 ENCOUNTER — Other Ambulatory Visit: Payer: Self-pay

## 2023-12-01 ENCOUNTER — Other Ambulatory Visit: Payer: Self-pay

## 2023-12-01 ENCOUNTER — Encounter: Payer: Self-pay | Admitting: Internal Medicine

## 2023-12-15 ENCOUNTER — Other Ambulatory Visit: Payer: Self-pay | Admitting: Pharmacist

## 2023-12-15 ENCOUNTER — Telehealth: Payer: Self-pay | Admitting: Family Medicine

## 2023-12-15 NOTE — Telephone Encounter (Signed)
Pt is calling in returning a call from [place]. Pt says she called her earlier.

## 2023-12-16 ENCOUNTER — Other Ambulatory Visit: Payer: Self-pay | Admitting: Pharmacist

## 2023-12-16 DIAGNOSIS — J4489 Other specified chronic obstructive pulmonary disease: Secondary | ICD-10-CM

## 2023-12-16 NOTE — Patient Instructions (Signed)
Goals Addressed             This Visit's Progress    Pharmacy Goals       Please follow up with [facility] at [phone] as needed for refills of your medications  Reminders for using your Breztri inhaler:  Please remember to prime your inhaler before using it for the first time. To do this, remove the cap from the mouthpiece, hold it upright, facing away from you, and shake well. Push the canister all the way down until it stops moving in the actuator to release a puff of medicine from the mouthpiece. You may hear a soft click from the dose indicator as it counts down. Repeat this process three times more.  To use the inhaler, take the cap off and shake the primed inhaler. Exhale as much as you reasonably can through your mouth, then close your lips around the mouthpiece and tilt your head back, keeping your tongue below the mouthpiece. Inhale deeply and slowly while pressing down on the canister until it stops moving. You should feel a puff of medicine. Remove the inhaler while holding your breath for as long as you comfortably can, up to 10 seconds, before breathing out gently. Repeat this process once more and replace the cap. You should be taking 2 puffs, 2 times a day. After your second puff, rinse your mouth with water to remove any excess medicine, making sure you don't swallow the water.   Thank you!  Estelle Grumbles, PharmD, [facility] [phone]

## 2023-12-16 NOTE — Progress Notes (Signed)
   12/16/2023  Patient ID: Lori Brady, female   DOB: November 19, 1945, 78 y.o.   MRN: [id number]  Return call to patient today.  Reports that she does not currently need assistance with re-enrollment in patient assistance for Breztri inhaler from [employer] as she now has a surplus of Breztri (enough to last through end of July) since received shipments of this inhaler from [facility] (already received for 2024 calendar year from [employer] patient assistance). - Request to re-enroll in July  Follow Up Plan: Clinical Pharmacist will follow up with patient by telephone on 05/24/2024 at 1:00 PM   Estelle Grumbles, PharmD, [facility] [phone]

## 2023-12-17 ENCOUNTER — Other Ambulatory Visit (HOSPITAL_BASED_OUTPATIENT_CLINIC_OR_DEPARTMENT_OTHER): Payer: Self-pay

## 2023-12-17 ENCOUNTER — Other Ambulatory Visit (HOSPITAL_COMMUNITY): Payer: Self-pay

## 2023-12-17 ENCOUNTER — Encounter: Payer: Self-pay | Admitting: Family Medicine

## 2023-12-17 ENCOUNTER — Ambulatory Visit: Payer: Medicare PPO | Admitting: Family Medicine

## 2023-12-17 VITALS — BP 162/60 | HR 83 | Wt 129.4 lb

## 2023-12-17 DIAGNOSIS — I4719 Other supraventricular tachycardia: Secondary | ICD-10-CM | POA: Diagnosis not present

## 2023-12-17 DIAGNOSIS — I429 Cardiomyopathy, unspecified: Secondary | ICD-10-CM

## 2023-12-17 DIAGNOSIS — M810 Age-related osteoporosis without current pathological fracture: Secondary | ICD-10-CM | POA: Diagnosis not present

## 2023-12-17 DIAGNOSIS — D692 Other nonthrombocytopenic purpura: Secondary | ICD-10-CM

## 2023-12-17 DIAGNOSIS — K5010 Crohn's disease of large intestine without complications: Secondary | ICD-10-CM

## 2023-12-17 DIAGNOSIS — J4489 Other specified chronic obstructive pulmonary disease: Secondary | ICD-10-CM

## 2023-12-17 DIAGNOSIS — E222 Syndrome of inappropriate secretion of antidiuretic hormone: Secondary | ICD-10-CM | POA: Diagnosis not present

## 2023-12-17 DIAGNOSIS — I10 Essential (primary) hypertension: Secondary | ICD-10-CM | POA: Diagnosis not present

## 2023-12-17 DIAGNOSIS — E782 Mixed hyperlipidemia: Secondary | ICD-10-CM | POA: Diagnosis not present

## 2023-12-17 MED ORDER — AZELASTINE HCL 0.1 % NA SOLN
2.0000 | Freq: Every day | NASAL | 11 refills | Status: AC
  Filled 2023-12-17: qty 30, 50d supply, fill #0
  Filled 2024-01-19 – 2024-02-25 (×2): qty 30, 50d supply, fill #1
  Filled 2024-05-08: qty 30, 50d supply, fill #2
  Filled 2024-06-14 – 2024-06-22 (×2): qty 30, 50d supply, fill #3
  Filled 2024-09-07: qty 30, 50d supply, fill #4
  Filled 2024-10-25: qty 30, 50d supply, fill #5

## 2023-12-17 NOTE — Assessment & Plan Note (Signed)
No recent flares Continue to follow with GI

## 2023-12-17 NOTE — Assessment & Plan Note (Signed)
Followed by Endocrinology Monitor Na level

## 2023-12-17 NOTE — Assessment & Plan Note (Signed)
On Lipitor 40 mg daily. Recent cholesterol levels were good. No immediate recheck needed. - Continue Lipitor 40 mg daily - Recheck cholesterol levels in summer

## 2023-12-17 NOTE — Assessment & Plan Note (Signed)
Chronic condition with wheezing and congestion. Uses nebulizer 2-3 times daily with some relief. Currently on Breztri, Singulair, albuterol inhaler, and DuoNebs. Reports nasal stuffiness and frequent epistaxis, likely due to dry air and lack of nasal spray. Discussed benefits of using plain Mucinex instead of Mucinex DM to avoid elevated blood pressure. Recommended humidifier and Vaseline for nasal dryness. - Refill Trelegy - Refill Astelin nasal spray - Recommend plain Mucinex instead of Mucinex DM - Recommend humidifier in bedroom - Advise Vaseline inside nostrils to prevent dryness and epistaxis

## 2023-12-17 NOTE — Assessment & Plan Note (Signed)
Blood pressure elevated today (156/60), likely due to Mucinex DM. No current antihypertensive medications. Blood pressure was normal at neurology visit last month. Discussed home monitoring after discontinuing Mucinex DM to ensure normalization. - Recheck blood pressure at end of visit - Advise home blood pressure monitoring after discontinuing Mucinex DM

## 2023-12-17 NOTE — Assessment & Plan Note (Signed)
Rate controlled today F/b Cardiology

## 2023-12-17 NOTE — Assessment & Plan Note (Signed)
H/o Takotsubo stress-induced in 2009  f/b Cardiology Continue statin

## 2023-12-17 NOTE — Assessment & Plan Note (Signed)
 Continue to monitor

## 2023-12-17 NOTE — Assessment & Plan Note (Signed)
Bone density test shows significant improvement in lumbar spine (from -2.9 to -1.5) but stable in femur. On Fosamax for two years, with plan to continue for five years before drug holiday. Discussed anticipated outcome of continued Fosamax use and plan to recheck bone density in two years. - Continue Fosamax - Schedule bone density test in two years - Plan for drug holiday in 2028 - f/b Endocrinology

## 2023-12-17 NOTE — Progress Notes (Signed)
Established patient visit   Patient: Lori Brady   DOB: [birth year]   78 y.o. Female  MRN: [id number] Visit Date: 12/17/2023  Today's healthcare provider: Shirlee Latch, MD   Chief Complaint  Patient presents with   Medical Management of Chronic Issues    6 month follow up   Hypertension   Hyperlipidemia   Anxiety   Subjective    Hypertension Associated symptoms include anxiety.  Hyperlipidemia  Anxiety     HPI     Medical Management of Chronic Issues    Additional comments: 6 month follow up        Hypertension   Orthopnea: Present.      Last edited by Acey Lav, CMA on 12/17/2023 10:56 AM.       Discussed the use of AI scribe software for clinical note transcription with the patient, who gave verbal consent to proceed.  History of Present Illness   Lori Brady, a patient with a history of respiratory issues managed with Breztri, Singulair, and a nebulizer, presents with persistent nasal congestion and frequent nosebleeds. She reports that her nose feels stuffy all the time, leading to wheezing and difficulty breathing. Despite using her nebulizer 2-3 times a day, she only experiences minimal relief. She also mentions a history of a broken arm that continues to cause her discomfort.  Lori Brady is also on Fosamax for osteoporosis, Lipitor for cholesterol management, and Astelin for nasal symptoms. She recently ran out of Astelin and has been without it for about a week, which may be contributing to her current nasal symptoms. She also reports taking Mucinex DM, which she suspects might be causing her blood pressure to rise. She has not been monitoring her blood pressure at home but notes that it is checked regularly during her frequent doctor visits.         Medications: Outpatient Medications Prior to Visit  Medication Sig   albuterol (VENTOLIN HFA) 108 (90 Base) MCG/ACT inhaler Inhale 2 puffs into the lungs every 6 (six) hours as needed for wheezing  or shortness of breath.   alendronate (FOSAMAX) 70 MG tablet Take 1 tablet (70 mg total) by mouth once a week -take on an empty stomach before breakfast with 8 oz of water. Remaine upright for 30 minutes.   aspirin 81 MG EC tablet Take 81 mg by mouth daily with breakfast.   atorvastatin (LIPITOR) 40 MG tablet Take 1 tablet (40 mg total) by mouth daily.   Budeson-Glycopyrrol-Formoterol (BREZTRI AEROSPHERE) 160-9-4.8 MCG/ACT AERO Inhale 2 puffs into the lungs in the morning and at bedtime. Getting from [facility]   carbidopa-levodopa (SINEMET CR) 50-200 MG tablet Take 1 tablet by mouth at bedtime.   carbidopa-levodopa (SINEMET CR) 50-200 MG tablet Take 1 tablet by mouth at bedtime   carbidopa-levodopa (SINEMET IR) 25-100 MG tablet Take 3 tablets with breakfast, 2 tablets with lunch, 2 tablets with supper.   carbidopa-levodopa (SINEMET IR) 25-100 MG tablet Take 3 tablets every morning AND 2 tablets 2 (two) times daily.   cetirizine (ZYRTEC) 10 MG tablet Take 1 tablet (10 mg total) by mouth daily.   Cholecalciferol (VITAMIN D) 2000 UNITS CAPS Take 1 capsule by mouth daily.   citalopram (CELEXA) 20 MG tablet Take 1 tablet (20 mg total) by mouth daily.   ferrous sulfate 325 (65 FE) MG EC tablet Take 650 mg by mouth daily with breakfast.   ipratropium-albuterol (DUONEB) 0.5-2.5 (3) MG/3ML SOLN Take 3 mLs by nebulization every 4 (four) hours as  needed.   melatonin 5 MG TABS Take 1 tablet by mouth at bedtime.   montelukast (SINGULAIR) 10 MG tablet Take 1 tablet (10 mg total) by mouth every morning.   omeprazole (PRILOSEC) 40 MG capsule Take 1 capsule (40 mg total) by mouth in the morning and at bedtime.   sodium fluoride (PREVIDENT 5000 DRY MOUTH) 1.1 % GEL dental gel Use as directed   Sodium Sulfate-Mag Sulfate-KCl (SUTAB) [phone] MG TABS At 5 PM take 12 tablets using the 8 oz cup provided in the kit drinking 5 cups of water and 5 hours before your procedure repeat the same process.   [DISCONTINUED]  azelastine (ASTELIN) 0.1 % nasal spray Place 2 sprays into both nostrils daily at 6 (six) AM.   No facility-administered medications prior to visit.    Review of Systems     Objective    BP (!) 162/60 (BP Location: Right Arm, Patient Position: Sitting, Cuff Size: Normal)   Pulse 83   Wt 129 lb 6.4 oz (58.7 kg)   SpO2 100%   BMI 24.45 kg/m    Physical Exam Vitals reviewed.  Constitutional:      General: She is not in acute distress.    Appearance: Normal appearance. She is well-developed. She is not diaphoretic.  HENT:     Head: Normocephalic and atraumatic.  Eyes:     General: No scleral icterus.    Conjunctiva/sclera: Conjunctivae normal.  Neck:     Thyroid: No thyromegaly.  Cardiovascular:     Rate and Rhythm: Normal rate and regular rhythm.     Heart sounds: Normal heart sounds. No murmur heard. Pulmonary:     Effort: Pulmonary effort is normal. No respiratory distress.     Breath sounds: Normal breath sounds. No wheezing, rhonchi or rales.  Musculoskeletal:     Cervical back: Neck supple.     Right lower leg: No edema.     Left lower leg: No edema.  Lymphadenopathy:     Cervical: No cervical adenopathy.  Skin:    General: Skin is warm and dry.     Findings: No rash.  Neurological:     Mental Status: She is alert and oriented to person, place, and time. Mental status is at baseline.  Psychiatric:        Mood and Affect: Mood normal.        Behavior: Behavior normal.      No results found for any visits on 12/17/23.  Assessment & Plan     Problem List Items Addressed This Visit       Cardiovascular and Mediastinum   Benign essential HTN   Blood pressure elevated today (156/60), likely due to Mucinex DM. No current antihypertensive medications. Blood pressure was normal at neurology visit last month. Discussed home monitoring after discontinuing Mucinex DM to ensure normalization. - Recheck blood pressure at end of visit - Advise home blood pressure  monitoring after discontinuing Mucinex DM      Atrial tachycardia (HCC) - Primary   Rate controlled today F/b Cardiology      Primary cardiomyopathy [facility])   H/o Takotsubo stress-induced in 2009  f/b Cardiology Continue statin      Senile purpura (HCC)   Continue to monitor         Respiratory   COPD (chronic obstructive pulmonary disease) with chronic bronchitis (HCC)   Chronic condition with wheezing and congestion. Uses nebulizer 2-3 times daily with some relief. Currently on Breztri, Singulair, albuterol inhaler, and DuoNebs. Reports  nasal stuffiness and frequent epistaxis, likely due to dry air and lack of nasal spray. Discussed benefits of using plain Mucinex instead of Mucinex DM to avoid elevated blood pressure. Recommended humidifier and Vaseline for nasal dryness. - Refill Trelegy - Refill Astelin nasal spray - Recommend plain Mucinex instead of Mucinex DM - Recommend humidifier in bedroom - Advise Vaseline inside nostrils to prevent dryness and epistaxis      Relevant Medications   azelastine (ASTELIN) 0.1 % nasal spray     Digestive   Crohn's disease of colon without complication (HCC)   No recent flares Continue to follow with GI        Endocrine   SIADH (syndrome of inappropriate ADH production) (HCC)   Followed by Endocrinology Monitor Na level        Musculoskeletal and Integument   Osteoporosis   Bone density test shows significant improvement in lumbar spine (from -2.9 to -1.5) but stable in femur. On Fosamax for two years, with plan to continue for five years before drug holiday. Discussed anticipated outcome of continued Fosamax use and plan to recheck bone density in two years. - Continue Fosamax - Schedule bone density test in two years - Plan for drug holiday in 2028 - f/b Endocrinology        Other   Mixed hyperlipidemia   On Lipitor 40 mg daily. Recent cholesterol levels were good. No immediate recheck needed. - Continue Lipitor 40 mg  daily - Recheck cholesterol levels in summer          General Health Maintenance Overall health maintenance up to date. Recent labs in November were good. Cholesterol checked in summer was good. - Schedule next physical in six months (end of July or early August)        Return in about 6 months (around 06/15/2024) for CPE.       Shirlee Latch, MD  [facility] [phone] (phone) [phone] (fax)  [facility]

## 2023-12-20 ENCOUNTER — Other Ambulatory Visit (HOSPITAL_COMMUNITY): Payer: Self-pay

## 2023-12-21 ENCOUNTER — Other Ambulatory Visit: Payer: Self-pay | Admitting: Family Medicine

## 2023-12-21 ENCOUNTER — Other Ambulatory Visit: Payer: Self-pay | Admitting: Nurse Practitioner

## 2023-12-21 ENCOUNTER — Other Ambulatory Visit: Payer: Self-pay

## 2023-12-21 ENCOUNTER — Other Ambulatory Visit (HOSPITAL_COMMUNITY): Payer: Self-pay

## 2023-12-21 DIAGNOSIS — J45909 Unspecified asthma, uncomplicated: Secondary | ICD-10-CM

## 2023-12-21 DIAGNOSIS — J301 Allergic rhinitis due to pollen: Secondary | ICD-10-CM

## 2023-12-21 DIAGNOSIS — F419 Anxiety disorder, unspecified: Secondary | ICD-10-CM

## 2023-12-21 DIAGNOSIS — E7800 Pure hypercholesterolemia, unspecified: Secondary | ICD-10-CM

## 2023-12-21 DIAGNOSIS — M810 Age-related osteoporosis without current pathological fracture: Secondary | ICD-10-CM

## 2023-12-21 MED ORDER — AMOXICILLIN 500 MG PO CAPS
ORAL_CAPSULE | ORAL | 0 refills | Status: DC
  Filled 2023-12-21: qty 22, 7d supply, fill #0

## 2023-12-21 MED ORDER — NYSTATIN 100000 UNIT/GM EX OINT
TOPICAL_OINTMENT | CUTANEOUS | 1 refills | Status: DC
  Filled 2023-12-21: qty 15, 7d supply, fill #0
  Filled 2023-12-23: qty 15, 7d supply, fill #1

## 2023-12-21 MED ORDER — CETIRIZINE HCL 10 MG PO TABS
10.0000 mg | ORAL_TABLET | Freq: Every day | ORAL | 1 refills | Status: DC
  Filled 2023-12-28: qty 30, 30d supply, fill #0
  Filled 2024-01-11 – 2024-01-20 (×2): qty 30, 30d supply, fill #1

## 2023-12-22 ENCOUNTER — Other Ambulatory Visit: Payer: Self-pay

## 2023-12-22 MED ORDER — MONTELUKAST SODIUM 10 MG PO TABS
10.0000 mg | ORAL_TABLET | Freq: Every morning | ORAL | 1 refills | Status: DC
  Filled 2023-12-22: qty 90, 90d supply, fill #0
  Filled 2023-12-28: qty 30, 30d supply, fill #0
  Filled 2024-01-11 – 2024-01-20 (×2): qty 30, 30d supply, fill #1
  Filled 2024-01-28 – 2024-02-16 (×2): qty 30, 30d supply, fill #2
  Filled 2024-03-06 – 2024-03-14 (×2): qty 30, 30d supply, fill #3
  Filled 2024-03-31 – 2024-04-11 (×2): qty 30, 30d supply, fill #4
  Filled 2024-05-23: qty 30, 30d supply, fill #5

## 2023-12-22 MED ORDER — CITALOPRAM HYDROBROMIDE 20 MG PO TABS
20.0000 mg | ORAL_TABLET | Freq: Every day | ORAL | 1 refills | Status: DC
  Filled 2023-12-22: qty 90, 90d supply, fill #0
  Filled 2023-12-28: qty 30, 30d supply, fill #0
  Filled 2024-01-11 – 2024-01-20 (×2): qty 30, 30d supply, fill #1
  Filled 2024-01-28 – 2024-02-16 (×2): qty 30, 30d supply, fill #2
  Filled 2024-03-06 – 2024-03-14 (×2): qty 30, 30d supply, fill #3
  Filled 2024-03-31 – 2024-04-11 (×2): qty 30, 30d supply, fill #4
  Filled 2024-05-23: qty 30, 30d supply, fill #5

## 2023-12-22 MED ORDER — ALENDRONATE SODIUM 70 MG PO TABS
70.0000 mg | ORAL_TABLET | ORAL | 1 refills | Status: DC
  Filled 2023-12-22 – 2023-12-28 (×2): qty 12, 84d supply, fill #0
  Filled 2024-03-06 – 2024-03-14 (×2): qty 12, 84d supply, fill #1

## 2023-12-22 MED ORDER — ATORVASTATIN CALCIUM 40 MG PO TABS
40.0000 mg | ORAL_TABLET | Freq: Every day | ORAL | 1 refills | Status: DC
  Filled 2023-12-22: qty 90, 90d supply, fill #0
  Filled 2023-12-28: qty 30, 30d supply, fill #0
  Filled 2024-01-11 – 2024-01-20 (×2): qty 30, 30d supply, fill #1
  Filled 2024-01-28 – 2024-02-16 (×2): qty 30, 30d supply, fill #2
  Filled 2024-03-06 – 2024-03-14 (×2): qty 30, 30d supply, fill #3
  Filled 2024-03-31 – 2024-04-11 (×2): qty 30, 30d supply, fill #4
  Filled 2024-05-12 – 2024-05-23 (×2): qty 30, 30d supply, fill #5

## 2023-12-23 ENCOUNTER — Other Ambulatory Visit (HOSPITAL_COMMUNITY): Payer: Self-pay

## 2023-12-23 ENCOUNTER — Other Ambulatory Visit: Payer: Self-pay

## 2023-12-28 ENCOUNTER — Other Ambulatory Visit (HOSPITAL_COMMUNITY): Payer: Self-pay

## 2023-12-28 ENCOUNTER — Encounter: Payer: Self-pay | Admitting: Internal Medicine

## 2023-12-28 ENCOUNTER — Other Ambulatory Visit: Payer: Self-pay

## 2023-12-29 ENCOUNTER — Other Ambulatory Visit: Payer: Self-pay

## 2024-01-11 ENCOUNTER — Other Ambulatory Visit: Payer: Self-pay

## 2024-01-19 ENCOUNTER — Other Ambulatory Visit (HOSPITAL_COMMUNITY): Payer: Self-pay

## 2024-01-19 ENCOUNTER — Other Ambulatory Visit: Payer: Self-pay

## 2024-01-20 ENCOUNTER — Other Ambulatory Visit: Payer: Self-pay

## 2024-01-20 ENCOUNTER — Encounter: Payer: Self-pay | Admitting: Internal Medicine

## 2024-01-26 ENCOUNTER — Other Ambulatory Visit: Payer: Self-pay

## 2024-01-28 ENCOUNTER — Other Ambulatory Visit: Payer: Self-pay

## 2024-01-28 ENCOUNTER — Other Ambulatory Visit: Payer: Self-pay | Admitting: Nurse Practitioner

## 2024-01-28 ENCOUNTER — Other Ambulatory Visit (HOSPITAL_COMMUNITY): Payer: Self-pay

## 2024-01-28 DIAGNOSIS — J301 Allergic rhinitis due to pollen: Secondary | ICD-10-CM

## 2024-01-28 MED ORDER — CETIRIZINE HCL 10 MG PO TABS
10.0000 mg | ORAL_TABLET | Freq: Every day | ORAL | 1 refills | Status: DC
  Filled 2024-02-16: qty 30, 30d supply, fill #0
  Filled 2024-03-06 – 2024-03-14 (×2): qty 30, 30d supply, fill #1

## 2024-02-07 ENCOUNTER — Other Ambulatory Visit: Payer: Self-pay | Admitting: Family Medicine

## 2024-02-07 DIAGNOSIS — J4489 Other specified chronic obstructive pulmonary disease: Secondary | ICD-10-CM

## 2024-02-07 DIAGNOSIS — R0602 Shortness of breath: Secondary | ICD-10-CM

## 2024-02-08 ENCOUNTER — Other Ambulatory Visit (HOSPITAL_COMMUNITY): Payer: Self-pay

## 2024-02-08 MED ORDER — ALBUTEROL SULFATE HFA 108 (90 BASE) MCG/ACT IN AERS
2.0000 | INHALATION_SPRAY | Freq: Four times a day (QID) | RESPIRATORY_TRACT | 1 refills | Status: DC | PRN
  Filled 2024-02-25: qty 18, 25d supply, fill #0
  Filled 2024-04-03: qty 18, 25d supply, fill #1

## 2024-02-16 ENCOUNTER — Ambulatory Visit: Payer: Self-pay | Admitting: Emergency Medicine

## 2024-02-16 ENCOUNTER — Encounter: Payer: Self-pay | Admitting: Internal Medicine

## 2024-02-16 ENCOUNTER — Other Ambulatory Visit: Payer: Self-pay

## 2024-02-16 VITALS — Ht 61.0 in | Wt 130.0 lb

## 2024-02-16 DIAGNOSIS — Z0000 Encounter for general adult medical examination without abnormal findings: Secondary | ICD-10-CM

## 2024-02-16 DIAGNOSIS — Z1231 Encounter for screening mammogram for malignant neoplasm of breast: Secondary | ICD-10-CM

## 2024-02-16 NOTE — Patient Instructions (Addendum)
 Lori Brady , Thank you for taking time to come for your Medicare Wellness Visit. I appreciate your ongoing commitment to your health goals. Please review the following plan we discussed and let me know if I can assist you in the future.   Referrals/Orders/Follow-Ups/Clinician Recommendations: I have placed an order for a mammogram (due 09/06/24). Call [facility] @ [phone] to schedule at your convenience.  This is a list of the screening recommended for you and due dates:  Health Maintenance  Topic Date Due   COVID-19 Vaccine (3 - 2024-25 season) 07/18/2023   Flu Shot  06/16/2024   Mammogram  09/05/2024   Medicare Annual Wellness Visit  02/15/2025   DEXA scan (bone density measurement)  09/05/2025   DTaP/Tdap/Td vaccine (2 - Td or Tdap) 04/02/2027   Colon Cancer Screening  07/19/2033   Pneumonia Vaccine  Completed   Hepatitis C Screening  Completed   Zoster (Shingles) Vaccine  Completed   HPV Vaccine  Aged Out   Screening for Lung Cancer  Discontinued    Advanced directives: (Declined) Advance directive discussed with you today. Even though you declined this today, please call our office should you change your mind, and we can give you the proper paperwork for you to fill out.  Next Medicare Annual Wellness Visit scheduled for next year: Yes, 02/21/25 @ 11:30am (phone visit)  Fall Prevention in the Home, Adult Falls can cause injuries and affect people of all ages. There are many simple things that you can do to make your home safe and to help prevent falls. If you need it, ask for help making these changes. What actions can I take to prevent falls? General information Use good lighting in all rooms. Make sure to: Replace any light bulbs that burn out. Turn on lights if it is dark and use night-lights. Keep items that you use often in easy-to-reach places. Lower the shelves around your home if needed. Move furniture so that there are clear paths around it. Do not keep  throw rugs or other things on the floor that can make you trip. If any of your floors are uneven, fix them. Add color or contrast paint or tape to clearly mark and help you see: Grab bars or handrails. First and last steps of staircases. Where the edge of each step is. If you use a ladder or stepladder: Make sure that it is fully opened. Do not climb a closed ladder. Make sure the sides of the ladder are locked in place. Have someone hold the ladder while you use it. Know where your pets are as you move through your home. What can I do in the bathroom?     Keep the floor dry. Clean up any water that is on the floor right away. Remove soap buildup in the bathtub or shower. Buildup makes bathtubs and showers slippery. Use non-skid mats or decals on the floor of the bathtub or shower. Attach bath mats securely with double-sided, non-slip rug tape. If you need to sit down while you are in the shower, use a non-slip stool. Install grab bars by the toilet and in the bathtub and shower. Do not use towel bars as grab bars. What can I do in the bedroom? Make sure that you have a light by your bed that is easy to reach. Do not use any sheets or blankets on your bed that hang to the floor. Have a firm bench or chair with side arms that you can use for  support when you get dressed. What can I do in the kitchen? Clean up any spills right away. If you need to reach something above you, use a sturdy step stool that has a grab bar. Keep electrical cables out of the way. Do not use floor polish or wax that makes floors slippery. What can I do with my stairs? Do not leave anything on the stairs. Make sure that you have a light switch at the top and the bottom of the stairs. Have them installed if you do not have them. Make sure that there are handrails on both sides of the stairs. Fix handrails that are broken or loose. Make sure that handrails are as long as the staircases. Install non-slip stair  treads on all stairs in your home if they do not have carpet. Avoid having throw rugs at the top or bottom of stairs, or secure the rugs with carpet tape to prevent them from moving. Choose a carpet design that does not hide the edge of steps on the stairs. Make sure that carpet is firmly attached to the stairs. Fix any carpet that is loose or worn. What can I do on the outside of my home? Use bright outdoor lighting. Repair the edges of walkways and driveways and fix any cracks. Clear paths of anything that can make you trip, such as tools or rocks. Add color or contrast paint or tape to clearly mark and help you see high doorway thresholds. Trim any bushes or trees on the main path into your home. Check that handrails are securely fastened and in good repair. Both sides of all steps should have handrails. Install guardrails along the edges of any raised decks or porches. Have leaves, snow, and ice cleared regularly. Use sand, salt, or ice melt on walkways during winter months if you live where there is ice and snow. In the garage, clean up any spills right away, including grease or oil spills. What other actions can I take? Review your medicines with your health care provider. Some medicines can make you confused or feel dizzy. This can increase your chance of falling. Wear closed-toe shoes that fit well and support your feet. Wear shoes that have rubber soles and low heels. Use a cane, walker, scooter, or crutches that help you move around if needed. Talk with your provider about other ways that you can decrease your risk of falls. This may include seeing a physical therapist to learn to do exercises to improve movement and strength. Where to find more information Centers for Disease Control and Prevention, [employer]: TonerPromos.no [employer] on Aging: BaseRingTones.pl National Institute on Aging: BaseRingTones.pl Contact a health care provider if: You are afraid of falling at home. You feel weak,  drowsy, or dizzy at home. You fall at home. Get help right away if you: Lose consciousness or have trouble moving after a fall. Have a fall that causes a head injury. These symptoms may be an emergency. Get help right away. Call 911. Do not wait to see if the symptoms will go away. Do not drive yourself to the hospital. This information is not intended to replace advice given to you by your health care provider. Make sure you discuss any questions you have with your health care provider. Document Revised: 07/06/2022 Document Reviewed: 07/06/2022 Elsevier Patient Education  2024 [employer].

## 2024-02-16 NOTE — Progress Notes (Signed)
 Subjective:   Lori Brady is a 78 y.o. who presents for a Medicare Wellness preventive visit.  Visit Complete: Virtual I connected with  [name] on 02/16/24 by a audio enabled telemedicine application and verified that I am speaking with the correct person using two identifiers.  Patient Location: Home  Provider Location: Home Office  I discussed the limitations of evaluation and management by telemedicine. The patient expressed understanding and agreed to proceed.  Vital Signs: Because this visit was a virtual/telehealth visit, some criteria may be missing or patient reported. Any vitals not documented were not able to be obtained and vitals that have been documented are patient reported.  VideoDeclined- This patient declined Librarian, academic. Therefore the visit was completed with audio only.  Persons Participating in Visit: Patient.  AWV Questionnaire: No: Patient Medicare AWV questionnaire was not completed prior to this visit.  Cardiac Risk Factors include: advanced age (>24men, >29 women);hypertension;dyslipidemia;Other (see comment);sedentary lifestyle, Risk factor comments: CAD     Objective:    Today's Vitals   02/16/24 1133 02/16/24 1134  Weight: 130 lb (59 kg)   Height: 5\' 1"  (1.549 m)   PainSc:  8    Body mass index is 24.56 kg/m.     02/16/2024   11:53 AM 09/20/2023    1:47 PM 07/20/2023    9:35 AM 03/22/2023    2:00 PM 02/03/2023    1:10 PM 12/21/2022   12:57 PM 06/01/2022    7:24 AM  Advanced Directives  Does Patient Have a Medical Advance Directive? No No No No No No No  Would patient like information on creating a medical advance directive? No - Patient declined   No - Patient declined       Current Medications (verified) Outpatient Encounter Medications as of 02/16/2024  Medication Sig   albuterol (VENTOLIN HFA) 108 (90 Base) MCG/ACT inhaler Inhale 2 puffs into the lungs every 6 (six) hours as needed for wheezing or  shortness of breath.   alendronate (FOSAMAX) 70 MG tablet Take 1 tablet (70 mg total) by mouth once a week -take on an empty stomach before breakfast with 8 oz of water. Remaine upright for 30 minutes.   aspirin 81 MG EC tablet Take 81 mg by mouth daily with breakfast.   atorvastatin (LIPITOR) 40 MG tablet Take 1 tablet (40 mg total) by mouth daily.   azelastine (ASTELIN) 0.1 % nasal spray Place 2 sprays into both nostrils daily at 6 (six) AM.   Budeson-Glycopyrrol-Formoterol (BREZTRI AEROSPHERE) 160-9-4.8 MCG/ACT AERO Inhale 2 puffs into the lungs in the morning and at bedtime. Getting from AZ&[facility]   carbidopa-levodopa (SINEMET CR) 50-200 MG tablet Take 1 tablet by mouth at bedtime   carbidopa-levodopa (SINEMET IR) 25-100 MG tablet Take 3 tablets with breakfast, 2 tablets with lunch, 2 tablets with supper.   cetirizine (ZYRTEC) 10 MG tablet Take 1 tablet (10 mg total) by mouth daily.   Cholecalciferol (VITAMIN D) 2000 UNITS CAPS Take 1 capsule by mouth daily.   citalopram (CELEXA) 20 MG tablet Take 1 tablet (20 mg total) by mouth daily.   ferrous sulfate 325 (65 FE) MG EC tablet Take 650 mg by mouth daily with breakfast.   ipratropium-albuterol (DUONEB) 0.5-2.5 (3) MG/3ML SOLN Take 3 mLs by nebulization every 4 (four) hours as needed.   melatonin 5 MG TABS Take 1 tablet by mouth at bedtime.   montelukast (SINGULAIR) 10 MG tablet Take 1 tablet (10 mg total) by mouth  every morning.   omeprazole (PRILOSEC) 40 MG capsule Take 1 capsule (40 mg total) by mouth in the morning and at bedtime.   Probiotic Product (PROBIOTIC DAILY PO) Take 1 tablet by mouth daily.   sodium fluoride (PREVIDENT 5000 DRY MOUTH) 1.1 % GEL dental gel Use as directed   amoxicillin (AMOXIL) 500 MG capsule take 2 capsules (1000mg ) by mouth stat then take 1 capsule (500 mg) three times a day with food (Patient not taking: Reported on 02/16/2024)   carbidopa-levodopa (SINEMET CR) 50-200 MG tablet Take 1 tablet by mouth at bedtime.  (Patient not taking: Reported on 02/16/2024)   carbidopa-levodopa (SINEMET IR) 25-100 MG tablet Take 3 tablets every morning AND 2 tablets 2 (two) times daily. (Patient not taking: Reported on 02/16/2024)   nystatin ointment (MYCOSTATIN) apply to the affected area 2 to 3 times daily (Patient not taking: Reported on 02/16/2024)   Sodium Sulfate-Mag Sulfate-KCl (SUTAB) [phone] MG TABS At 5 PM take 12 tablets using the 8 oz cup provided in the kit drinking 5 cups of water and 5 hours before your procedure repeat the same process. (Patient not taking: Reported on 02/16/2024)   No facility-administered encounter medications on file as of 02/16/2024.    Allergies (verified) Levofloxacin and Vicodin [hydrocodone-acetaminophen]   History: Past Medical History:  Diagnosis Date   Arthritis    Asthma    Crohn's disease (HCC)    Emphysema of lung (HCC)    GERD (gastroesophageal reflux disease)    Hip fracture (HCC) 01/26/2017   History of chicken pox    History of heart attack    Hx of completed stroke    Hypercholesteremia    Hypertension    Parkinson disease (HCC)    Pneumonia    Seasonal allergies    Sepsis (HCC)    Septic shock (HCC) 10/08/2016   UTI (lower urinary tract infection)    Past Surgical History:  Procedure Laterality Date   BIOPSY  07/20/2023   Procedure: BIOPSY;  Surgeon: Wyline Mood, MD;  Location: [facility] ENDOSCOPY;  Service: Gastroenterology;;   BREAST BIOPSY Bilateral 1960's to 90's   breast cysts     CATARACT EXTRACTION, BILATERAL     COLONOSCOPY WITH PROPOFOL N/A 06/01/2022   Procedure: COLONOSCOPY WITH PROPOFOL;  Surgeon: Wyline Mood, MD;  Location: [facility] ENDOSCOPY;  Service: Gastroenterology;  Laterality: N/A;   COLONOSCOPY WITH PROPOFOL N/A 04/19/2023   Procedure: COLONOSCOPY WITH PROPOFOL;  Surgeon: Wyline Mood, MD;  Location: [facility] ENDOSCOPY;  Service: Gastroenterology;  Laterality: N/A;   COLONOSCOPY WITH PROPOFOL N/A 07/20/2023   Procedure: COLONOSCOPY WITH PROPOFOL;   Surgeon: Wyline Mood, MD;  Location: [facility] ENDOSCOPY;  Service: Gastroenterology;  Laterality: N/A;   ELBOW SURGERY     right   ESOPHAGOGASTRODUODENOSCOPY (EGD) WITH PROPOFOL N/A 04/19/2023   Procedure: ESOPHAGOGASTRODUODENOSCOPY (EGD) WITH PROPOFOL;  Surgeon: Wyline Mood, MD;  Location: [facility] ENDOSCOPY;  Service: Gastroenterology;  Laterality: N/A;   ESOPHAGOGASTRODUODENOSCOPY (EGD) WITH PROPOFOL N/A 07/20/2023   Procedure: ESOPHAGOGASTRODUODENOSCOPY (EGD) WITH PROPOFOL;  Surgeon: Wyline Mood, MD;  Location: [facility] ENDOSCOPY;  Service: Gastroenterology;  Laterality: N/A;   GIVENS CAPSULE STUDY N/A 08/03/2023   Procedure: GIVENS CAPSULE STUDY;  Surgeon: Wyline Mood, MD;  Location: [facility] ENDOSCOPY;  Service: Gastroenterology;  Laterality: N/A;   HOT HEMOSTASIS  07/20/2023   Procedure: HOT HEMOSTASIS (ARGON PLASMA COAGULATION/BICAP);  Surgeon: Wyline Mood, MD;  Location: [facility] ENDOSCOPY;  Service: Gastroenterology;;   POLYPECTOMY  07/20/2023   Procedure: POLYPECTOMY;  Surgeon: Wyline Mood, MD;  Location: [facility]  ENDOSCOPY;  Service: Gastroenterology;;   right wrist fracture     TUBAL LIGATION     WRIST SURGERY Left    Family History  Problem Relation Age of Onset   Cancer Sister        ovarian/uterine/breast   Breast cancer Sister 68   Cancer Sister        breast   Heart disease Sister    Breast cancer Sister 5   Heart disease Father    Cancer Father        liver   Pancreatic cancer Mother    Heart disease Brother    Heart attack Brother    Cancer Brother        liver   Heart disease Sister    Diabetes Maternal Grandmother    Social History   Socioeconomic History   Marital status: Widowed    Spouse name: Not on file   Number of children: 3   Years of education: Not on file   Highest education level: Some college, no degree  Occupational History   Occupation: retired  Tobacco Use   Smoking status: Former    Current packs/day: 0.00    Average packs/day: 1 pack/day for 30.0 years (30.0  ttl pk-yrs)    Types: Cigarettes    Start date: 08/01/1978    Quit date: 08/01/2008    Years since quitting: 15.5   Smokeless tobacco: Never  Vaping Use   Vaping status: Never Used  Substance and Sexual Activity   Alcohol use: Not Currently    Alcohol/week: 1.0 standard drink of alcohol    Types: 1 Shots of liquor per week   Drug use: No   Sexual activity: Not on file  Other Topics Concern   Not on file  Social History Narrative   Not on file   Social Drivers of Health   Financial Resource Strain: Low Risk  (02/16/2024)   Overall Financial Resource Strain (CARDIA)    Difficulty of Paying Living Expenses: Not hard at all  Food Insecurity: No Food Insecurity (02/16/2024)   Hunger Vital Sign    Worried About Running Out of Food in the Last Year: Never true    Ran Out of Food in the Last Year: Never true  Transportation Needs: No Transportation Needs (02/16/2024)   PRAPARE - Administrator, Civil Service (Medical): No    Lack of Transportation (Non-Medical): No  Physical Activity: Insufficiently Active (02/16/2024)   Exercise Vital Sign    Days of Exercise per Week: 5 days    Minutes of Exercise per Session: 10 min  Stress: No Stress Concern Present (02/16/2024)   [employer] of Occupational Health - Occupational Stress Questionnaire    Feeling of Stress : Not at all  Social Connections: Socially Isolated (02/16/2024)   Social Connection and Isolation Panel [NHANES]    Frequency of Communication with Friends and Family: More than three times a week    Frequency of Social Gatherings with Friends and Family: More than three times a week    Attends Religious Services: Never    Database administrator or Organizations: No    Attends Banker Meetings: Never    Marital Status: Widowed    Tobacco Counseling Counseling given: Not Answered    Clinical Intake:  Pre-visit preparation completed: Yes  Pain : 0-10 Pain Score: 8  Pain Type: Chronic pain Pain  Location: Arm Pain Orientation: Right Pain Descriptors / Indicators: Aching     BMI -  recorded: 24.56 Nutritional Status: BMI of 19-24  Normal Nutritional Risks: None Diabetes: No  Lab Results  Component Value Date   HGBA1C 5.6 06/14/2023   HGBA1C 5.8 (H) 10/26/2022   HGBA1C 5.3 01/26/2017     How often do you need to have someone help you when you read instructions, pamphlets, or other written materials from your doctor or pharmacy?: 1 - Never  Interpreter Needed?: No  Information entered by :: Tora Kindred, CMA   Activities of Daily Living     02/16/2024   11:38 AM 06/14/2023    1:24 PM  In your present state of health, do you have any difficulty performing the following activities:  Hearing? 1 1  Comment has hearing aids, but doesn't wear all the time   Vision? 0 0  Difficulty concentrating or making decisions? 0 0  Walking or climbing stairs? 1 1  Comment has rollator, motorized scooter   Dressing or bathing? 0 0  Doing errands, shopping? 1 1  Comment doesn't drive, family takes to appointments   Preparing Food and eating ? Y   Comment due to Parkinson's disease   Using the Toilet? N   In the past six months, have you accidently leaked urine? N   Do you have problems with loss of bowel control? N   Managing your Medications? N   Managing your Finances? N   Housekeeping or managing your Housekeeping? Y   Comment has a house cleaner     Patient Care Team: Beryle Flock, Marzella Schlein, MD as PCP - General (Family Medicine) Lamar Blinks, MD as Consulting Physician (Cardiology) Jesusita Oka, MD as Consulting Physician (Dermatology) Yevonne Pax, MD as Consulting Physician (Internal Medicine) Antony Contras, MD as Consulting Physician (Ophthalmology) Tedd Sias, Marlana Salvage, MD as Physician Assistant (Endocrinology) Lonell Face, MD as Consulting Physician (Neurology) Fredderick Erb, MD as Attending Physician (Orthopedic Surgery) Ronney Asters Jackelyn Poling,  RPH-CPP as Pharmacist Earna Coder, MD as Consulting Physician (Oncology) Antony Contras, MD as Consulting Physician (Ophthalmology)  Indicate any recent Medical Services you may have received from other than [facility] providers in the past year (date may be approximate).     Assessment:   This is a routine wellness examination for Bryn.  Hearing/Vision screen Hearing Screening - Comments:: Has hearing aids, but doesn't wear all the time Vision Screening - Comments:: Gets routine eye exams, Dr. Antony Contras, [facility]    Goals Addressed             This Visit's Progress    Patient Stated       Maintain health       Depression Screen     02/16/2024   11:50 AM 12/17/2023   10:57 AM 06/14/2023    1:23 PM 02/03/2023    1:08 PM 12/22/2022    2:40 PM 10/26/2022    1:44 PM 04/14/2022   11:02 AM  PHQ 2/9 Scores  PHQ - 2 Score 0 0 0 0 0 0 0  PHQ- 9 Score 0 2  0 3 3     Fall Risk     02/16/2024   11:54 AM 06/14/2023    1:23 PM 02/02/2023    1:43 PM 12/22/2022    2:40 PM 10/26/2022    1:44 PM  Fall Risk   Falls in the past year? 0 0 0 0 0  Number falls in past yr: 0 0 0 0 0  Injury with Fall? 0 0 0 0 0  Risk for  fall due to : Impaired balance/gait;Impaired mobility  No Fall Risks Impaired balance/gait Impaired balance/gait  Follow up Falls prevention discussed;Falls evaluation completed;Education provided  Education provided;Falls prevention discussed Falls evaluation completed;Education provided;Falls prevention discussed Falls evaluation completed    MEDICARE RISK AT HOME:  Medicare Risk at Home Any stairs in or around the home?: No If so, are there any without handrails?: No Home free of loose throw rugs in walkways, pet beds, electrical cords, etc?: Yes Adequate lighting in your home to reduce risk of falls?: Yes Life alert?: Yes Use of a cane, walker or w/c?: Yes (rollator and motorized scooter) Grab bars in the bathroom?: Yes Shower chair or bench in shower?:  Yes Elevated toilet seat or a handicapped toilet?: Yes  TIMED UP AND GO:  Was the test performed?  No  Cognitive Function: 6CIT completed        02/16/2024   11:56 AM 02/03/2023    1:14 PM 01/22/2020    2:15 PM  6CIT Screen  What Year? 0 points 0 points 0 points  What month? 0 points 0 points 0 points  What time? 0 points 0 points 0 points  Count back from 20 0 points 0 points 0 points  Months in reverse 0 points 0 points 0 points  Repeat phrase 0 points 0 points 0 points  Total Score 0 points 0 points 0 points    Immunizations Immunization History  Administered Date(s) Administered   Fluad Quad(high Dose 65+) 09/05/2019, 09/10/2021, 10/26/2022   Fluad Trivalent(High Dose 65+) 08/17/2023   Influenza Inj Mdck Quad Pf 07/23/2020   Influenza Split 10/28/2011, 08/10/2012   Influenza, High Dose Seasonal PF 08/21/2014, 08/29/2015   Influenza,inj,Quad PF,6+ Mos 08/16/2013   Influenza-Unspecified 08/16/2014, 08/11/2018, 09/10/2021   PFIZER(Purple Top)SARS-COV-2 Vaccination 02/01/2020, 02/27/2020   Pneumococcal Conjugate-13 01/10/2015   Pneumococcal Polysaccharide-23 05/29/2013   Tdap 04/01/2017   Zoster Recombinant(Shingrix) 06/14/2023, 08/17/2023   Zoster, Live 08/10/2012    Screening Tests Health Maintenance  Topic Date Due   COVID-19 Vaccine (3 - 2024-25 season) 07/18/2023   INFLUENZA VACCINE  06/16/2024   MAMMOGRAM  09/05/2024   Medicare Annual Wellness (AWV)  02/15/2025   DEXA SCAN  09/05/2025   DTaP/Tdap/Td (2 - Td or Tdap) 04/02/2027   Colonoscopy  07/19/2033   Pneumonia Vaccine 91+ Years old  Completed   Hepatitis C Screening  Completed   Zoster Vaccines- Shingrix  Completed   HPV VACCINES  Aged Out   Lung Cancer Screening  Discontinued    Health Maintenance  Health Maintenance Due  Topic Date Due   COVID-19 Vaccine (3 - 2024-25 season) 07/18/2023   Health Maintenance Items Addressed: Mammogram ordered, See Nurse Notes  Additional Screening:  Vision  Screening: Recommended annual ophthalmology exams for early detection of glaucoma and other disorders of the eye.  Dental Screening: Recommended annual dental exams for proper oral hygiene  Community Resource Referral / Chronic Care Management: CRR required this visit?  No   CCM required this visit?  No     Plan:     I have personally reviewed and noted the following in the patient's chart:   Medical and social history Use of alcohol, tobacco or illicit drugs  Current medications and supplements including opioid prescriptions. Patient is not currently taking opioid prescriptions. Functional ability and status Nutritional status Physical activity Advanced directives List of other physicians Hospitalizations, surgeries, and ER visits in previous 12 months Vitals Screenings to include cognitive, depression, and falls Referrals and appointments  In  addition, I have reviewed and discussed with patient certain preventive protocols, quality metrics, and best practice recommendations. A written personalized care plan for preventive services as well as general preventive health recommendations were provided to patient.     Tora Kindred, CMA   02/16/2024   After Visit Summary: (MyChart) Due to this being a telephonic visit, the after visit summary with patients personalized plan was offered to patient via MyChart   Notes:  Placed order for a MMG (due ~09/05/24) Patient declined covid vaccines

## 2024-02-17 ENCOUNTER — Other Ambulatory Visit: Payer: Self-pay

## 2024-02-25 ENCOUNTER — Other Ambulatory Visit: Payer: Self-pay | Admitting: Family Medicine

## 2024-02-25 ENCOUNTER — Other Ambulatory Visit (HOSPITAL_COMMUNITY): Payer: Self-pay

## 2024-02-25 ENCOUNTER — Other Ambulatory Visit: Payer: Self-pay

## 2024-02-25 DIAGNOSIS — J4489 Other specified chronic obstructive pulmonary disease: Secondary | ICD-10-CM

## 2024-02-25 DIAGNOSIS — R0602 Shortness of breath: Secondary | ICD-10-CM

## 2024-02-25 NOTE — Telephone Encounter (Signed)
 Called pharmacy s/w Lorin Picket. Pt has refill available. They will prepare and ship medication.

## 2024-02-25 NOTE — Telephone Encounter (Signed)
 Need to call pharmacy

## 2024-02-25 NOTE — Telephone Encounter (Unsigned)
 Copied from CRM [id number]. Topic: Clinical - Medication Refill >> Feb 25, 2024 10:55 AM Gery Pray wrote: Most Recent Primary Care Visit:  Provider: Tora Kindred  Department: [facility]  Visit Type: MEDICARE AWV, SEQUENTIAL  Date: 02/16/2024  Medication: albuterol (VENTOLIN HFA) 108 (90 Base) MCG/ACT inhaler  Has the patient contacted their pharmacy? Yes (Agent: If no, request that the patient contact the pharmacy for the refill. If patient does not wish to contact the pharmacy document the reason why and proceed with request.) (Agent: If yes, when and what did the pharmacy advise?) Advised to call the office   Is this the correct pharmacy for this prescription? Yes If no, delete pharmacy and type the correct one.  This is the patient's preferred pharmacy:  [facility] [address] [place] [address] Phone: [phone] Fax: [phone]   Has the prescription been filled recently? No  Is the patient out of the medication? Yes  Has the patient been seen for an appointment in the last year OR does the patient have an upcoming appointment? Yes  Can we respond through MyChart? Yes  Agent: Please be advised that Rx refills may take up to 3 business days. We ask that you follow-up with your pharmacy.

## 2024-02-26 ENCOUNTER — Other Ambulatory Visit (HOSPITAL_COMMUNITY): Payer: Self-pay

## 2024-03-06 ENCOUNTER — Other Ambulatory Visit: Payer: Self-pay

## 2024-03-14 ENCOUNTER — Other Ambulatory Visit: Payer: Self-pay

## 2024-03-14 ENCOUNTER — Encounter: Payer: Self-pay | Admitting: Internal Medicine

## 2024-03-17 ENCOUNTER — Other Ambulatory Visit: Payer: Self-pay | Admitting: *Deleted

## 2024-03-17 DIAGNOSIS — D509 Iron deficiency anemia, unspecified: Secondary | ICD-10-CM

## 2024-03-20 ENCOUNTER — Other Ambulatory Visit: Payer: Self-pay

## 2024-03-20 ENCOUNTER — Inpatient Hospital Stay: Payer: Medicare PPO | Admitting: Internal Medicine

## 2024-03-20 ENCOUNTER — Inpatient Hospital Stay: Payer: Medicare PPO

## 2024-03-20 ENCOUNTER — Encounter: Payer: Self-pay | Admitting: Internal Medicine

## 2024-03-20 ENCOUNTER — Inpatient Hospital Stay: Payer: Medicare PPO | Attending: Internal Medicine

## 2024-03-20 VITALS — BP 146/74 | HR 82 | Temp 98.9°F | Resp 16 | Wt 134.0 lb

## 2024-03-20 DIAGNOSIS — D509 Iron deficiency anemia, unspecified: Secondary | ICD-10-CM | POA: Diagnosis not present

## 2024-03-20 DIAGNOSIS — Z79899 Other long term (current) drug therapy: Secondary | ICD-10-CM | POA: Insufficient documentation

## 2024-03-20 DIAGNOSIS — D649 Anemia, unspecified: Secondary | ICD-10-CM

## 2024-03-20 LAB — CBC WITH DIFFERENTIAL (CANCER CENTER ONLY)
Abs Immature Granulocytes: 0.14 10*3/uL — ABNORMAL HIGH (ref 0.00–0.07)
Basophils Absolute: 0 10*3/uL (ref 0.0–0.1)
Basophils Relative: 0 %
Eosinophils Absolute: 0.2 10*3/uL (ref 0.0–0.5)
Eosinophils Relative: 2 %
HCT: 36.3 % (ref 36.0–46.0)
Hemoglobin: 11.8 g/dL — ABNORMAL LOW (ref 12.0–15.0)
Immature Granulocytes: 1 %
Lymphocytes Relative: 20 %
Lymphs Abs: 2.1 10*3/uL (ref 0.7–4.0)
MCH: 29.7 pg (ref 26.0–34.0)
MCHC: 32.5 g/dL (ref 30.0–36.0)
MCV: 91.4 fL (ref 80.0–100.0)
Monocytes Absolute: 0.7 10*3/uL (ref 0.1–1.0)
Monocytes Relative: 7 %
Neutro Abs: 7.1 10*3/uL (ref 1.7–7.7)
Neutrophils Relative %: 70 %
Platelet Count: 169 10*3/uL (ref 150–400)
RBC: 3.97 MIL/uL (ref 3.87–5.11)
RDW: 13.3 % (ref 11.5–15.5)
WBC Count: 10.2 10*3/uL (ref 4.0–10.5)
nRBC: 0 % (ref 0.0–0.2)

## 2024-03-20 LAB — IRON AND TIBC
Iron: 87 ug/dL (ref 28–170)
Saturation Ratios: 23 % (ref 10.4–31.8)
TIBC: 377 ug/dL (ref 250–450)
UIBC: 290 ug/dL

## 2024-03-20 LAB — BASIC METABOLIC PANEL - CANCER CENTER ONLY
Anion gap: 9 (ref 5–15)
BUN: 18 mg/dL (ref 8–23)
CO2: 26 mmol/L (ref 22–32)
Calcium: 8.6 mg/dL — ABNORMAL LOW (ref 8.9–10.3)
Chloride: 102 mmol/L (ref 98–111)
Creatinine: 0.81 mg/dL (ref 0.44–1.00)
GFR, Estimated: >60 mL/min (ref >60)
Glucose, Bld: 129 mg/dL — ABNORMAL HIGH (ref 70–99)
Potassium: 3.9 mmol/L (ref 3.5–5.1)
Sodium: 137 mmol/L (ref 135–145)

## 2024-03-20 LAB — FERRITIN: Ferritin: 35 ng/mL (ref 11–307)

## 2024-03-20 NOTE — Progress Notes (Signed)
 Pt in for follow up, reports feeling well, energy level is better.

## 2024-03-20 NOTE — Progress Notes (Signed)
 [facility] CONSULT NOTE  Patient Care Team: David Escort, Stan Eans, MD as PCP - General (Family Medicine) Mariane Shire, MD as Consulting Physician (Dermatology) Cordie Deters, MD as Consulting Physician (Internal Medicine) Alvina Axon, MD as Consulting Physician (Ophthalmology) Rosan Comfort, MD as Consulting Physician (Neurology) Delles, Severa Daniels, RPH-CPP as Pharmacist Gwyn Leos, MD as Consulting Physician (Oncology) Luke Salaam, MD as Consulting Physician (Gastroenterology) Paich, Kaitlin, PA-C (Neurology) Vonna Guardian Donald Frost, MD as Referring Physician (Vascular Surgery)  CHIEF COMPLAINTS/PURPOSE OF CONSULTATION: ANEMIA   HEMATOLOGY HISTORY  # ANEMIA[Hb; MCV-platelets- WBC; Iron  sat; ferritin;  GFR- CT/US ; EGD- none; /colonoscopy-2022- Dr.Anna [GI]   Latest Reference Range & Units 10/26/22 13:39  Iron  27 - 139 ug/dL 15 (L)  UIBC 409 - 811 ug/dL 914 (H)  TIBC 782 - 956 ug/dL 213  Ferritin 15 - 086 ng/mL 14 (L)  Iron  Saturation 15 - 55 % 4 (LL)  (LL): Data is critically low (L): Data is abnormally low (H): Data is abnormally high  HISTORY OF PRESENTING ILLNESS: in a wheel chair; with her sister in law.   Darla Edward 78 y.o.  female pleasant patient iron  deficient anemia-of unclear etiology is here for follow-up.   Patient is on oral iron  pills.  Denies any constipation or diarrhea.  No blood in stools or black or stools.  Notes slight improvement of energy levels.  Review of Systems  Constitutional:  Negative for chills, diaphoresis, fever, malaise/fatigue and weight loss.  HENT:  Negative for nosebleeds and sore throat.   Eyes:  Negative for double vision.  Respiratory:  Negative for cough, hemoptysis, sputum production, shortness of breath and wheezing.   Cardiovascular:  Negative for chest pain, palpitations, orthopnea and leg swelling.  Gastrointestinal:  Negative for abdominal pain, blood in stool, constipation, diarrhea, heartburn,  melena, nausea and vomiting.  Genitourinary:  Negative for dysuria, frequency and urgency.  Musculoskeletal:  Negative for back pain and joint pain.  Skin: Negative.  Negative for itching and rash.  Neurological:  Negative for dizziness, tingling, focal weakness, weakness and headaches.  Endo/Heme/Allergies:  Does not bruise/bleed easily.  Psychiatric/Behavioral:  Negative for depression. The patient is not nervous/anxious and does not have insomnia.      MEDICAL HISTORY:  Past Medical History:  Diagnosis Date   Arthritis    Asthma    Crohn's disease (HCC)    Emphysema of lung (HCC)    GERD (gastroesophageal reflux disease)    Hip fracture (HCC) 01/26/2017   History of chicken pox    History of heart attack    Hx of completed stroke    Hypercholesteremia    Hypertension    Parkinson disease (HCC)    Pneumonia    Seasonal allergies    Sepsis (HCC)    Septic shock (HCC) 10/08/2016   UTI (lower urinary tract infection)     SURGICAL HISTORY: Past Surgical History:  Procedure Laterality Date   BIOPSY  07/20/2023   Procedure: BIOPSY;  Surgeon: Luke Salaam, MD;  Location: [facility] ENDOSCOPY;  Service: Gastroenterology;;   BREAST BIOPSY Bilateral 1960's to 90's   breast cysts     CATARACT EXTRACTION, BILATERAL     COLONOSCOPY WITH PROPOFOL  N/A 06/01/2022   Procedure: COLONOSCOPY WITH PROPOFOL ;  Surgeon: Luke Salaam, MD;  Location: [facility] ENDOSCOPY;  Service: Gastroenterology;  Laterality: N/A;   COLONOSCOPY WITH PROPOFOL  N/A 04/19/2023   Procedure: COLONOSCOPY WITH PROPOFOL ;  Surgeon: Luke Salaam, MD;  Location: [facility] ENDOSCOPY;  Service: Gastroenterology;  Laterality: N/A;   COLONOSCOPY WITH PROPOFOL  N/A 07/20/2023   Procedure: COLONOSCOPY WITH PROPOFOL ;  Surgeon: Luke Salaam, MD;  Location: [facility] ENDOSCOPY;  Service: Gastroenterology;  Laterality: N/A;   ELBOW SURGERY     right   ESOPHAGOGASTRODUODENOSCOPY (EGD) WITH PROPOFOL  N/A 04/19/2023   Procedure: ESOPHAGOGASTRODUODENOSCOPY (EGD) WITH  PROPOFOL ;  Surgeon: Luke Salaam, MD;  Location: [facility] ENDOSCOPY;  Service: Gastroenterology;  Laterality: N/A;   ESOPHAGOGASTRODUODENOSCOPY (EGD) WITH PROPOFOL  N/A 07/20/2023   Procedure: ESOPHAGOGASTRODUODENOSCOPY (EGD) WITH PROPOFOL ;  Surgeon: Luke Salaam, MD;  Location: [facility] ENDOSCOPY;  Service: Gastroenterology;  Laterality: N/A;   GIVENS CAPSULE STUDY N/A 08/03/2023   Procedure: GIVENS CAPSULE STUDY;  Surgeon: Luke Salaam, MD;  Location: [facility] ENDOSCOPY;  Service: Gastroenterology;  Laterality: N/A;   HOT HEMOSTASIS  07/20/2023   Procedure: HOT HEMOSTASIS (ARGON PLASMA COAGULATION/BICAP);  Surgeon: Luke Salaam, MD;  Location: [facility] ENDOSCOPY;  Service: Gastroenterology;;   POLYPECTOMY  07/20/2023   Procedure: POLYPECTOMY;  Surgeon: Luke Salaam, MD;  Location: [facility] ENDOSCOPY;  Service: Gastroenterology;;   right wrist fracture     TUBAL LIGATION     WRIST SURGERY Left     SOCIAL HISTORY: Social History   Socioeconomic History   Marital status: Widowed    Spouse name: Not on file   Number of children: 3   Years of education: Not on file   Highest education level: Some college, no degree  Occupational History   Occupation: retired  Tobacco Use   Smoking status: Former    Current packs/day: 0.00    Average packs/day: 1 pack/day for 30.0 years (30.0 ttl pk-yrs)    Types: Cigarettes    Start date: 08/01/1978    Quit date: 08/01/2008    Years since quitting: 15.6   Smokeless tobacco: Never  Vaping Use   Vaping status: Never Used  Substance and Sexual Activity   Alcohol use: Not Currently    Alcohol/week: 1.0 standard drink of alcohol    Types: 1 Shots of liquor per week   Drug use: No   Sexual activity: Not on file  Other Topics Concern   Not on file  Social History Narrative   Not on file   Social Drivers of Health   Financial Resource Strain: Low Risk  (02/16/2024)   Overall Financial Resource Strain (CARDIA)    Difficulty of Paying Living Expenses: Not hard at all  Food  Insecurity: No Food Insecurity (02/16/2024)   Hunger Vital Sign    Worried About Running Out of Food in the Last Year: Never true    Ran Out of Food in the Last Year: Never true  Transportation Needs: No Transportation Needs (02/16/2024)   PRAPARE - Administrator, Civil Service (Medical): No    Lack of Transportation (Non-Medical): No  Physical Activity: Insufficiently Active (02/16/2024)   Exercise Vital Sign    Days of Exercise per Week: 5 days    Minutes of Exercise per Session: 10 min  Stress: No Stress Concern Present (02/16/2024)   [employer] of Occupational Health - Occupational Stress Questionnaire    Feeling of Stress : Not at all  Social Connections: Socially Isolated (02/16/2024)   Social Connection and Isolation Panel [NHANES]    Frequency of Communication with Friends and Family: More than three times a week    Frequency of Social Gatherings with Friends and Family: More than three times a week    Attends Religious Services: Never    Database administrator or Organizations: No  Attends Banker Meetings: Never    Marital Status: Widowed  Intimate Partner Violence: Not At Risk (02/16/2024)   Humiliation, Afraid, Rape, and Kick questionnaire    Fear of Current or Ex-Partner: No    Emotionally Abused: No    Physically Abused: No    Sexually Abused: No    FAMILY HISTORY: Family History  Problem Relation Age of Onset   Cancer Sister        ovarian/uterine/breast   Breast cancer Sister 31   Cancer Sister        breast   Heart disease Sister    Breast cancer Sister 79   Heart disease Father    Cancer Father        liver   Pancreatic cancer Mother    Heart disease Brother    Heart attack Brother    Cancer Brother        liver   Heart disease Sister    Diabetes Maternal Grandmother     ALLERGIES:  is allergic to levofloxacin  and vicodin [hydrocodone -acetaminophen ].  MEDICATIONS:  Current Outpatient Medications  Medication Sig Dispense  Refill   albuterol  (VENTOLIN  HFA) 108 (90 Base) MCG/ACT inhaler Inhale 2 puffs into the lungs every 6 (six) hours as needed for wheezing or shortness of breath. 18 g 1   alendronate  (FOSAMAX ) 70 MG tablet Take 1 tablet (70 mg total) by mouth once a week -take on an empty stomach before breakfast with 8 oz of water. Remaine upright for 30 minutes. 12 tablet 1   aspirin  81 MG EC tablet Take 81 mg by mouth daily with breakfast.     atorvastatin  (LIPITOR) 40 MG tablet Take 1 tablet (40 mg total) by mouth daily. 90 tablet 1   azelastine  (ASTELIN ) 0.1 % nasal spray Place 2 sprays into both nostrils daily at 6 (six) AM. 30 mL 11   Budeson-Glycopyrrol-Formoterol  (BREZTRI  AEROSPHERE) 160-9-4.8 MCG/ACT AERO Inhale 2 puffs into the lungs in the morning and at bedtime. Getting from [facility]     carbidopa -levodopa  (SINEMET  CR) 50-200 MG tablet Take 1 tablet by mouth at bedtime 90 tablet 3   carbidopa -levodopa  (SINEMET  IR) 25-100 MG tablet Take 3 tablets with breakfast, 2 tablets with lunch, 2 tablets with supper.     cetirizine  (ZYRTEC ) 10 MG tablet Take 1 tablet (10 mg total) by mouth daily. 30 tablet 1   Cholecalciferol  (VITAMIN D ) 2000 UNITS CAPS Take 1 capsule by mouth daily.     citalopram  (CELEXA ) 20 MG tablet Take 1 tablet (20 mg total) by mouth daily. 90 tablet 1   ferrous sulfate 325 (65 FE) MG EC tablet Take 650 mg by mouth daily with breakfast.     ipratropium-albuterol  (DUONEB) 0.5-2.5 (3) MG/3ML SOLN Take 3 mLs by nebulization every 4 (four) hours as needed. 360 mL 3   melatonin 5 MG TABS Take 1 tablet by mouth at bedtime.     montelukast  (SINGULAIR ) 10 MG tablet Take 1 tablet (10 mg total) by mouth every morning. 90 tablet 1   omeprazole  (PRILOSEC) 40 MG capsule Take 1 capsule (40 mg total) by mouth in the morning and at bedtime. 180 capsule 3   Probiotic Product (PROBIOTIC DAILY PO) Take 1 tablet by mouth daily.     sodium fluoride  (PREVIDENT 5000 DRY MOUTH) 1.1 % GEL dental gel Use as directed 100  mL 5   carbidopa -levodopa  (SINEMET  CR) 50-200 MG tablet Take 1 tablet by mouth at bedtime. (Patient not taking: Reported on 03/20/2024)  carbidopa -levodopa  (SINEMET  IR) 25-100 MG tablet Take 3 tablets every morning AND 2 tablets 2 (two) times daily. (Patient not taking: Reported on 03/20/2024) 210 tablet 11   nystatin  ointment (MYCOSTATIN ) apply to the affected area 2 to 3 times daily (Patient not taking: Reported on 03/20/2024) 15 g 1   Sodium Sulfate-Mag Sulfate-KCl (SUTAB ) [phone] MG TABS At 5 PM take 12 tablets using the 8 oz cup provided in the kit drinking 5 cups of water and 5 hours before your procedure repeat the same process. (Patient not taking: Reported on 03/20/2024) 24 tablet 0   No current facility-administered medications for this visit.     Aaron Aas  PHYSICAL EXAMINATION:   Vitals:   03/20/24 1519  BP: (!) 146/74  Pulse: 82  Resp: 16  Temp: 98.9 F (37.2 C)  SpO2: 95%   Filed Weights   03/20/24 1519  Weight: 134 lb (60.8 kg)    Physical Exam Vitals and nursing note reviewed.  HENT:     Head: Normocephalic and atraumatic.     Mouth/Throat:     Pharynx: Oropharynx is clear.  Eyes:     Extraocular Movements: Extraocular movements intact.     Pupils: Pupils are equal, round, and reactive to light.  Cardiovascular:     Rate and Rhythm: Normal rate and regular rhythm.  Pulmonary:     Comments: Decreased breath sounds bilaterally.  Abdominal:     Palpations: Abdomen is soft.  Musculoskeletal:        General: Normal range of motion.     Cervical back: Normal range of motion.  Skin:    General: Skin is warm.  Neurological:     General: No focal deficit present.     Mental Status: She is alert and oriented to person, place, and time.  Psychiatric:        Behavior: Behavior normal.        Judgment: Judgment normal.      LABORATORY DATA:  I have reviewed the data as listed Lab Results  Component Value Date   WBC 10.2 03/20/2024   HGB 11.8 (L) 03/20/2024    HCT 36.3 03/20/2024   MCV 91.4 03/20/2024   PLT 169 03/20/2024   Recent Labs    03/22/23 1355 09/20/23 1340 03/20/24 1442  NA 139 136 137  K 3.7 3.6 3.9  CL 102 101 102  CO2 26 26 26   GLUCOSE 105* 131* 129*  BUN 17 23 18   CREATININE 0.52 0.66 0.81  CALCIUM  9.0 8.6* 8.6*  GFRNONAA >60 >60 >60  PROT 7.5  --   --   ALBUMIN 4.1  --   --   AST 30  --   --   ALT 7  --   --   ALKPHOS 127*  --   --   BILITOT 0.9  --   --      No results found.  ASSESSMENT & PLAN:   Symptomatic anemia #Iron  deficient anemia:[iron  sat- 4%; LOW ferritin; Hb 8.3- DEC 2023-PCP.  Currently on  PO iron  BID. No constipation.  # MAY 2024- Hb 12 improved- continue PO iron  BID. HOLD venofer .   #Etiology of iron  deficiency: Unclear; prior GI evaluation-/ Dr.Anna- colonoscopy [[id number] WNL]; awaiting evaluation with Dr.Anna on June 3rd, 2024-EGD/capsule study. UA neg for blood in dec 2023.   # History of Parkinson disease/COPD-[stroke 2007-vocal cord paralysis]stable.  # DISPOSITION: # NO venofer  today # follow up in 6 months- MD; labs- cbc/bmp; iron  studies/ferritin- possible venofer  Dr.B  All questions were answered. The patient knows to call the clinic with any problems, questions or concerns.    Gwyn Leos, MD 03/20/2024 3:50 PM

## 2024-03-20 NOTE — Assessment & Plan Note (Addendum)
#  Iron deficient anemia:[iron sat- 4%; LOW ferritin; Hb 8.3- DEC 2023-PCP.  Currently on  PO iron BID. No constipation.  # MAY 2024- Hb 12 improved- continue PO iron BID. HOLD venofer.   #Etiology of iron deficiency: Unclear; prior GI evaluation-/ Dr.Anna- colonoscopy [[id number] WNL]; awaiting evaluation with Dr.Anna on June 3rd, 2024-EGD/capsule study. UA neg for blood in dec 2023.   # History of Parkinson disease/COPD-[stroke 2007-vocal cord paralysis]stable.  # DISPOSITION: # NO venofer today # follow up in 6 months- MD; labs- cbc/bmp; iron studies/ferritin- possible venofer Dr.B

## 2024-03-21 ENCOUNTER — Ambulatory Visit: Payer: Self-pay

## 2024-03-21 NOTE — Telephone Encounter (Signed)
 Appt made for 03/22/2024 at 2pm at [facility]

## 2024-03-21 NOTE — Telephone Encounter (Signed)
 Chief Complaint: arm pain Symptoms: see above Frequency: "for a while" Pertinent Negatives: Patient denies fever, redness, heat, rashes, CP, difficulty breathing, numbness, one-sided weakness (different than baseline) Disposition: [] ED /[] Urgent Care (no appt availability in office) / [x] Appointment(In office/virtual)/ []  [facility]/ [] Home Care/ [] Refused Recommended Disposition /[] [facility]/ []  Follow-up with PCP Additional Notes: Pt reports R arm pain "for a while" that is an 8/10 with movement and a 4-6/10 at rest. Pt endorses hx of stroke with weakness to the R arm and also adds she broke that same arm. Pt denies numbness and tingling. Pt endorses some R arm weakness d/t stroke, but states that is not new. Pt states she can still use her arm and has to use her arm to use her walker. Pt endorses swelling to her elbow, but denies redness, rash, heat, infection, CP, SOB. Pt believes she has arthritis. No availability in the office until May 20. RN advised pt RN would relay message to the office and ask that they follow-up in order to get her scheduled. RN also advised pt if she develops CP, SOB, worsening swelling to the arm, redness, heat, fever, new or worsening one-sided weakness, numbness she needs to call 911. Pt verbalized understanding.    Copied from CRM [id number]. Topic: Clinical - Red Word Triage >> Mar 21, 2024 10:50 AM Oddis Bench wrote: Red Word that prompted transfer to Nurse Triage: Patient is calling about pain in arm she is stating that it is a 8 most of the time. Reason for Disposition  [1] MODERATE pain (e.g., interferes with normal activities) AND [2] present > 3 days  Answer Assessment - Initial Assessment Questions 1. ONSET: "When did the pain start?"     "For a while" 2. LOCATION: "Where is the pain located?"     Shoulder to elbow, R arm  3. PAIN: "How bad is the pain?" (Scale 1-10; or mild, moderate, severe)   - MILD (1-3): Doesn't interfere with  normal activities.   - MODERATE (4-7): Interferes with normal activities (e.g., work or school) or awakens from sleep.   - SEVERE (8-10): Excruciating pain, unable to do any normal activities, unable to hold a cup of water.     "When I move it it's an 8", 4-6/10 without movement 4. WORK OR EXERCISE: "Has there been any recent work or exercise that involved this part of the body?"     No - just getting up and down  5. CAUSE: "What do you think is causing the arm pain?"     Arthritis  6. OTHER SYMPTOMS: "Do you have any other symptoms?" (e.g., neck pain, swelling, rash, fever, numbness, weakness)     States she broke her arm a couple yrs ago, never been this bad. Hx arthritis. Tylenol  and Excedrin have not helped. Pt states she can use the arm but it hurts. Needs to use her arm to use her walker. Denies numbness tingling. "I think it's swollen a little." Denies redness. Denies heat, infection. No fever. No rashes.  Protocols used: Arm Pain-A-AH

## 2024-03-22 ENCOUNTER — Encounter: Payer: Self-pay | Admitting: Internal Medicine

## 2024-03-22 ENCOUNTER — Ambulatory Visit: Admitting: Nurse Practitioner

## 2024-03-22 ENCOUNTER — Ambulatory Visit
Admission: RE | Admit: 2024-03-22 | Discharge: 2024-03-22 | Disposition: A | Discharge status: Home / Self Care | Attending: Nurse Practitioner | Admitting: Nurse Practitioner

## 2024-03-22 ENCOUNTER — Ambulatory Visit
Admission: RE | Admit: 2024-03-22 | Discharge: 2024-03-22 | Disposition: A | Discharge status: Home / Self Care | Source: Ambulatory Visit | Attending: Nurse Practitioner

## 2024-03-22 ENCOUNTER — Other Ambulatory Visit (HOSPITAL_COMMUNITY): Payer: Self-pay

## 2024-03-22 ENCOUNTER — Other Ambulatory Visit: Payer: Self-pay

## 2024-03-22 ENCOUNTER — Encounter: Payer: Self-pay | Admitting: Nurse Practitioner

## 2024-03-22 VITALS — BP 124/68 | HR 92 | Temp 98.2°F | Resp 18 | Ht 61.0 in | Wt 134.0 lb

## 2024-03-22 DIAGNOSIS — G8929 Other chronic pain: Secondary | ICD-10-CM

## 2024-03-22 DIAGNOSIS — M25511 Pain in right shoulder: Secondary | ICD-10-CM | POA: Insufficient documentation

## 2024-03-22 DIAGNOSIS — S2241XA Multiple fractures of ribs, right side, initial encounter for closed fracture: Secondary | ICD-10-CM | POA: Diagnosis not present

## 2024-03-22 DIAGNOSIS — M79601 Pain in right arm: Secondary | ICD-10-CM | POA: Diagnosis not present

## 2024-03-22 DIAGNOSIS — M19011 Primary osteoarthritis, right shoulder: Secondary | ICD-10-CM | POA: Diagnosis not present

## 2024-03-22 MED ORDER — CELECOXIB 100 MG PO CAPS
100.0000 mg | ORAL_CAPSULE | Freq: Two times a day (BID) | ORAL | 0 refills | Status: DC
Start: 2024-03-22 — End: 2024-03-31
  Filled 2024-03-22: qty 42, 21d supply, fill #0
  Filled 2024-03-22: qty 60, 30d supply, fill #0

## 2024-03-22 NOTE — Progress Notes (Signed)
 BP 124/68   Pulse 92   Temp 98.2 F (36.8 C)   Resp 18   Ht 5\' 1"  (1.549 m)   Wt 134 lb (60.8 kg)   SpO2 96%   BMI 25.32 kg/m    Subjective:    Patient ID: Lori Brady, female    DOB: February 18, 1946, 78 y.o.   MRN: [id number]  HPI: Lori Brady is a 78 y.o. female  Chief Complaint  Patient presents with   Arm Pain    Right x months and getting worse    Discussed the use of AI scribe software for clinical note transcription with the patient, who gave verbal consent to proceed.  History of Present Illness Lori Brady is a 78 year old female with Parkinson's disease who presents with arm pain. She is accompanied by her sister.  She has been experiencing pain in her right shoulder and upper arm for the past two to three months. The pain originates in the arm and extends to the shoulder, sparing the elbow. She has a history of multiple forearm fractures, and initially thought this might be related to her current pain. She is unsure if the pain is due to arthritis. She denies any new trauma.    She has tried taking Excedrin for the pain, but it does not alleviate her symptoms. The pain significantly impacts her daily life, especially given her Parkinson's disease, which requires the use of her arms for mobility and daily activities.  No pain in the elbow.         02/16/2024   11:50 AM 12/17/2023   10:57 AM 06/14/2023    1:23 PM  Depression screen PHQ 2/9  Decreased Interest 0 0 0  Down, Depressed, Hopeless 0 0 0  PHQ - 2 Score 0 0 0  Altered sleeping 0 0   Tired, decreased energy 0 0   Change in appetite 0 1   Feeling bad or failure about yourself  0 0   Trouble concentrating 0 0   Moving slowly or fidgety/restless 0 1   Suicidal thoughts 0 0   PHQ-9 Score 0 2   Difficult doing work/chores Not difficult at all Somewhat difficult     Relevant past medical, surgical, family and social history reviewed and updated as indicated. Interim medical history since our last  visit reviewed. Allergies and medications reviewed and updated.  Review of Systems  Ten systems reviewed and is negative except as mentioned in HPI       Objective:    BP 124/68   Pulse 92   Temp 98.2 F (36.8 C)   Resp 18   Ht 5\' 1"  (1.549 m)   Wt 134 lb (60.8 kg)   SpO2 96%   BMI 25.32 kg/m    Wt Readings from Last 3 Encounters:  03/22/24 134 lb (60.8 kg)  03/20/24 134 lb (60.8 kg)  02/16/24 130 lb (59 kg)    Physical Exam Vitals reviewed.  Constitutional:      Appearance: Normal appearance.  HENT:     Head: Normocephalic.  Cardiovascular:     Rate and Rhythm: Normal rate.  Pulmonary:     Effort: Pulmonary effort is normal.  Musculoskeletal:        General: Tenderness present. Normal range of motion.     Comments: Tenderness and decreased range of motion of right shoulder  Neurological:     General: No focal deficit present.     Mental Status: She is  alert and oriented to person, place, and time. Mental status is at baseline.  Psychiatric:        Mood and Affect: Mood normal.        Behavior: Behavior normal.        Thought Content: Thought content normal.        Judgment: Judgment normal.    Physical Exam    Results for orders placed or performed in visit on 03/20/24  Iron  and TIBC([facility])   Collection Time: 03/20/24  2:42 PM  Result Value Ref Range   Iron  87 28 - 170 ug/dL   TIBC 409 811 - 914 ug/dL   Saturation Ratios 23 10.4 - 31.8 %   UIBC 290 ug/dL  Ferritin   Collection Time: 03/20/24  2:42 PM  Result Value Ref Range   Ferritin 35 11 - 307 ng/mL  Basic Metabolic Panel - Cancer Center Only   Collection Time: 03/20/24  2:42 PM  Result Value Ref Range   Sodium 137 135 - 145 mmol/L   Potassium 3.9 3.5 - 5.1 mmol/L   Chloride 102 98 - 111 mmol/L   CO2 26 22 - 32 mmol/L   Glucose, Bld 129 (H) 70 - 99 mg/dL   BUN 18 8 - 23 mg/dL   Creatinine 7.82 9.56 - 1.00 mg/dL   Calcium  8.6 (L) 8.9 - 10.3 mg/dL   GFR, Estimated >21 >30 mL/min    Anion gap 9 5 - 15  CBC with Differential (Cancer Center Only)   Collection Time: 03/20/24  2:42 PM  Result Value Ref Range   WBC Count 10.2 4.0 - 10.5 K/uL   RBC 3.97 3.87 - 5.11 MIL/uL   Hemoglobin 11.8 (L) 12.0 - 15.0 g/dL   HCT 86.5 78.4 - 69.6 %   MCV 91.4 80.0 - 100.0 fL   MCH 29.7 26.0 - 34.0 pg   MCHC 32.5 30.0 - 36.0 g/dL   RDW 29.5 28.4 - 13.2 %   Platelet Count 169 150 - 400 K/uL   nRBC 0.0 0.0 - 0.2 %   Neutrophils Relative % 70 %   Neutro Abs 7.1 1.7 - 7.7 K/uL   Lymphocytes Relative 20 %   Lymphs Abs 2.1 0.7 - 4.0 K/uL   Monocytes Relative 7 %   Monocytes Absolute 0.7 0.1 - 1.0 K/uL   Eosinophils Relative 2 %   Eosinophils Absolute 0.2 0.0 - 0.5 K/uL   Basophils Relative 0 %   Basophils Absolute 0.0 0.0 - 0.1 K/uL   Immature Granulocytes 1 %   Abs Immature Granulocytes 0.14 (H) 0.00 - 0.07 K/uL       Assessment & Plan:   Problem List Items Addressed This Visit   None Visit Diagnoses       Chronic right shoulder pain    -  Primary   Relevant Medications   celecoxib (CELEBREX) 100 MG capsule   Other Relevant Orders   DG Shoulder Right     Right arm pain       Relevant Medications   celecoxib (CELEBREX) 100 MG capsule        Assessment and Plan Assessment & Plan Shoulder and forearm pain Chronic pain in the shoulder and upper arm, persisting for 2-3 months, located above the elbow and extending to the shoulder. Previous fractures in the forearm. Pain is unresponsive to Excedrin. Differential diagnosis includes arthritis, considering the fracture history and pain characteristics. - Order x-ray of the shoulder and forearm to assess for underlying issues. -  Prescribe Celebrex to be taken twice daily with food for pain management.         Follow up plan: Return if symptoms worsen or fail to improve.

## 2024-03-23 ENCOUNTER — Encounter: Payer: Self-pay | Admitting: Nurse Practitioner

## 2024-03-23 ENCOUNTER — Other Ambulatory Visit: Payer: Self-pay | Admitting: Nurse Practitioner

## 2024-03-23 DIAGNOSIS — G8929 Other chronic pain: Secondary | ICD-10-CM

## 2024-03-29 ENCOUNTER — Ambulatory Visit: Payer: Medicare PPO | Admitting: Internal Medicine

## 2024-03-29 DIAGNOSIS — J4489 Other specified chronic obstructive pulmonary disease: Secondary | ICD-10-CM | POA: Diagnosis not present

## 2024-03-29 DIAGNOSIS — R0602 Shortness of breath: Secondary | ICD-10-CM

## 2024-03-31 ENCOUNTER — Other Ambulatory Visit: Payer: Self-pay | Admitting: Nurse Practitioner

## 2024-03-31 ENCOUNTER — Other Ambulatory Visit: Payer: Self-pay

## 2024-03-31 ENCOUNTER — Other Ambulatory Visit (HOSPITAL_COMMUNITY): Payer: Self-pay

## 2024-03-31 DIAGNOSIS — M79601 Pain in right arm: Secondary | ICD-10-CM

## 2024-03-31 DIAGNOSIS — J301 Allergic rhinitis due to pollen: Secondary | ICD-10-CM

## 2024-03-31 DIAGNOSIS — G8929 Other chronic pain: Secondary | ICD-10-CM

## 2024-04-03 ENCOUNTER — Other Ambulatory Visit (HOSPITAL_COMMUNITY): Payer: Self-pay

## 2024-04-03 ENCOUNTER — Other Ambulatory Visit: Payer: Self-pay

## 2024-04-03 DIAGNOSIS — S42201D Unspecified fracture of upper end of right humerus, subsequent encounter for fracture with routine healing: Secondary | ICD-10-CM | POA: Diagnosis not present

## 2024-04-03 MED ORDER — CETIRIZINE HCL 10 MG PO TABS
10.0000 mg | ORAL_TABLET | Freq: Every day | ORAL | 1 refills | Status: DC
  Filled 2024-04-03 – 2024-04-11 (×2): qty 30, 30d supply, fill #0
  Filled 2024-05-23: qty 30, 30d supply, fill #1

## 2024-04-04 ENCOUNTER — Encounter (INDEPENDENT_AMBULATORY_CARE_PROVIDER_SITE_OTHER): Payer: Self-pay

## 2024-04-04 ENCOUNTER — Other Ambulatory Visit: Payer: Self-pay

## 2024-04-04 ENCOUNTER — Other Ambulatory Visit (HOSPITAL_COMMUNITY): Payer: Self-pay

## 2024-04-04 MED ORDER — CELECOXIB 100 MG PO CAPS
100.0000 mg | ORAL_CAPSULE | Freq: Two times a day (BID) | ORAL | 0 refills | Status: DC
  Filled 2024-04-04 – 2024-04-11 (×2): qty 60, 30d supply, fill #0

## 2024-04-04 NOTE — Telephone Encounter (Signed)
 Rx not received by pharmacy- will resend Requested Prescriptions  Pending Prescriptions Disp Refills   celecoxib  (CELEBREX ) 100 MG capsule 60 capsule 0    Sig: Take 1 capsule (100 mg total) by mouth 2 (two) times daily.     Analgesics:  COX2 Inhibitors Failed - 04/04/2024  8:26 AM      Failed - Manual Review: Labs are only required if the patient has taken medication for more than 8 weeks.      Failed - HGB in normal range and within 360 days    Hemoglobin  Date Value Ref Range Status  03/20/2024 11.8 (L) 12.0 - 15.0 g/dL Final  16/08/9603 8.3 (L) 11.1 - 15.9 g/dL Final         Failed - AST in normal range and within 360 days    AST  Date Value Ref Range Status  03/22/2023 30 15 - 41 U/L Final   SGOT(AST)  Date Value Ref Range Status  04/26/2014 27 15 - 37 Unit/L Final         Failed - ALT in normal range and within 360 days    ALT  Date Value Ref Range Status  03/22/2023 7 0 - 44 U/L Final   SGPT (ALT)  Date Value Ref Range Status  04/26/2014 37 12 - 78 U/L Final         Passed - Cr in normal range and within 360 days    Creatinine  Date Value Ref Range Status  03/20/2024 0.81 0.44 - 1.00 mg/dL Final  54/07/8118 1.47 mg/dL Final    Comment:    8.29-5.62 NOTE: New Reference Range  01/22/15          Passed - HCT in normal range and within 360 days    HCT  Date Value Ref Range Status  03/20/2024 36.3 36.0 - 46.0 % Final   Hematocrit  Date Value Ref Range Status  10/26/2022 28.2 (L) 34.0 - 46.6 % Final         Passed - eGFR is 30 or above and within 360 days    EGFR (African American)  Date Value Ref Range Status  03/10/2015 >60  Final   GFR calc Af Amer  Date Value Ref Range Status  07/25/2020 83 >59 mL/min/1.73 Final    Comment:    **[facility] currently reports eGFR in compliance with the current**   recommendations of the [employer]. [facility] will   update reporting as new guidelines are published from the NKF-ASN   Task force.     EGFR (Non-African Amer.)  Date Value Ref Range Status  03/10/2015 >60  Final    Comment:    eGFR values <29mL/min/1.73 m2 may be an indication of chronic kidney disease (CKD). Calculated eGFR is useful in patients with stable renal function. The eGFR calculation will not be reliable in acutely ill patients when serum creatinine is changing rapidly. It is not useful in patients on dialysis. The eGFR calculation may not be applicable to patients at the low and high extremes of body sizes, pregnant women, and vegetarians.    GFR, Estimated  Date Value Ref Range Status  03/20/2024 >60 >60 mL/min Final    Comment:    (NOTE) Calculated using the CKD-EPI Creatinine Equation (2021)    eGFR  Date Value Ref Range Status  10/26/2022 88 >59 mL/min/1.73 Final         Passed - Patient is not pregnant      Passed - Valid encounter  within last 12 months    Recent Outpatient Visits           1 week ago Chronic right shoulder pain   [facility] [place], [place]

## 2024-04-11 ENCOUNTER — Ambulatory Visit: Payer: Medicare PPO | Admitting: Internal Medicine

## 2024-04-11 ENCOUNTER — Encounter: Payer: Self-pay | Admitting: Internal Medicine

## 2024-04-11 ENCOUNTER — Other Ambulatory Visit (HOSPITAL_COMMUNITY): Payer: Self-pay

## 2024-04-11 ENCOUNTER — Other Ambulatory Visit: Payer: Self-pay

## 2024-04-11 VITALS — BP 122/75 | HR 85 | Temp 98.0°F | Resp 16 | Ht 61.0 in | Wt 134.0 lb

## 2024-04-11 DIAGNOSIS — J4489 Other specified chronic obstructive pulmonary disease: Secondary | ICD-10-CM

## 2024-04-11 DIAGNOSIS — K219 Gastro-esophageal reflux disease without esophagitis: Secondary | ICD-10-CM | POA: Diagnosis not present

## 2024-04-11 DIAGNOSIS — G20C Parkinsonism, unspecified: Secondary | ICD-10-CM

## 2024-04-11 NOTE — Patient Instructions (Signed)

## 2024-04-11 NOTE — Progress Notes (Signed)
 [facility] [address] [place], [place] [address]  Pulmonary Sleep Medicine   Office Visit Note  Patient Name: Lori Brady DOB: [birth year] MRN [id number]  Date of Service: 04/11/2024  Complaints/HPI: She had PFT done and this shows improvement since 2022. She states she feels better with the breztri . Patient uses her rescue once or twice a day or when she needs it.   Office Spirometry Results:     ROS  General: (-) fever, (-) chills, (-) night sweats, (-) weakness Skin: (-) rashes, (-) itching,. Eyes: (-) visual changes, (-) redness, (-) itching. Nose and Sinuses: (-) nasal stuffiness or itchiness, (-) postnasal drip, (-) nosebleeds, (-) sinus trouble. Mouth and Throat: (-) sore throat, (-) hoarseness. Neck: (-) swollen glands, (-) enlarged thyroid, (-) neck pain. Respiratory: + cough, (-) bloody sputum, + shortness of breath, - wheezing. Cardiovascular: - ankle swelling, (-) chest pain. Lymphatic: (-) lymph node enlargement. Neurologic: (-) numbness, (-) tingling. Psychiatric: (-) anxiety, (-) depression   Current Medication: Outpatient Encounter Medications as of 04/11/2024  Medication Sig Note   albuterol  (VENTOLIN  HFA) 108 (90 Base) MCG/ACT inhaler Inhale 2 puffs into the lungs every 6 (six) hours as needed for wheezing or shortness of breath.    alendronate  (FOSAMAX ) 70 MG tablet Take 1 tablet (70 mg total) by mouth once a week -take on an empty stomach before breakfast with 8 oz of water. Remaine upright for 30 minutes.    aspirin  81 MG EC tablet Take 81 mg by mouth daily with breakfast.    atorvastatin  (LIPITOR) 40 MG tablet Take 1 tablet (40 mg total) by mouth daily.    azelastine  (ASTELIN ) 0.1 % nasal spray Place 2 sprays into both nostrils daily at 6 (six) AM.    Budeson-Glycopyrrol-Formoterol  (BREZTRI  AEROSPHERE) 160-9-4.8 MCG/ACT AERO Inhale 2 puffs into the lungs in the morning and at bedtime. Getting from [facility]ME    carbidopa -levodopa  (SINEMET   CR) 50-200 MG tablet Take 1 tablet by mouth at bedtime.    carbidopa -levodopa  (SINEMET  CR) 50-200 MG tablet Take 1 tablet by mouth at bedtime    carbidopa -levodopa  (SINEMET  IR) 25-100 MG tablet Take 3 tablets with breakfast, 2 tablets with lunch, 2 tablets with supper.    carbidopa -levodopa  (SINEMET  IR) 25-100 MG tablet Take 3 tablets every morning AND 2 tablets 2 (two) times daily.    celecoxib  (CELEBREX ) 100 MG capsule Take 1 capsule (100 mg total) by mouth 2 (two) times daily.    cetirizine  (ZYRTEC ) 10 MG tablet Take 1 tablet (10 mg total) by mouth daily.    Cholecalciferol  (VITAMIN D ) 2000 UNITS CAPS Take 1 capsule by mouth daily.    citalopram  (CELEXA ) 20 MG tablet Take 1 tablet (20 mg total) by mouth daily.    ferrous sulfate 325 (65 FE) MG EC tablet Take 650 mg by mouth daily with breakfast. 02/16/2024: Takes twice daily   ipratropium-albuterol  (DUONEB) 0.5-2.5 (3) MG/3ML SOLN Take 3 mLs by nebulization every 4 (four) hours as needed.    melatonin 5 MG TABS Take 1 tablet by mouth at bedtime.    montelukast  (SINGULAIR ) 10 MG tablet Take 1 tablet (10 mg total) by mouth every morning.    nystatin  ointment (MYCOSTATIN ) apply to the affected area 2 to 3 times daily    omeprazole  (PRILOSEC) 40 MG capsule Take 1 capsule (40 mg total) by mouth in the morning and at bedtime.    Probiotic Product (PROBIOTIC DAILY PO) Take 1 tablet by mouth daily.    sodium  fluoride  (PREVIDENT 5000 DRY MOUTH) 1.1 % GEL dental gel Use as directed    Sodium Sulfate-Mag Sulfate-KCl (SUTAB ) [phone] MG TABS At 5 PM take 12 tablets using the 8 oz cup provided in the kit drinking 5 cups of water and 5 hours before your procedure repeat the same process.    No facility-administered encounter medications on file as of 04/11/2024.    Surgical History: Past Surgical History:  Procedure Laterality Date   BIOPSY  07/20/2023   Procedure: BIOPSY;  Surgeon: Luke Salaam, MD;  Location: [facility] ENDOSCOPY;  Service:  Gastroenterology;;   BREAST BIOPSY Bilateral 1960's to 90's   breast cysts     CATARACT EXTRACTION, BILATERAL     COLONOSCOPY WITH PROPOFOL  N/A 06/01/2022   Procedure: COLONOSCOPY WITH PROPOFOL ;  Surgeon: Luke Salaam, MD;  Location: [facility] ENDOSCOPY;  Service: Gastroenterology;  Laterality: N/A;   COLONOSCOPY WITH PROPOFOL  N/A 04/19/2023   Procedure: COLONOSCOPY WITH PROPOFOL ;  Surgeon: Luke Salaam, MD;  Location: [facility] ENDOSCOPY;  Service: Gastroenterology;  Laterality: N/A;   COLONOSCOPY WITH PROPOFOL  N/A 07/20/2023   Procedure: COLONOSCOPY WITH PROPOFOL ;  Surgeon: Luke Salaam, MD;  Location: [facility] ENDOSCOPY;  Service: Gastroenterology;  Laterality: N/A;   ELBOW SURGERY     right   ESOPHAGOGASTRODUODENOSCOPY (EGD) WITH PROPOFOL  N/A 04/19/2023   Procedure: ESOPHAGOGASTRODUODENOSCOPY (EGD) WITH PROPOFOL ;  Surgeon: Luke Salaam, MD;  Location: [facility] ENDOSCOPY;  Service: Gastroenterology;  Laterality: N/A;   ESOPHAGOGASTRODUODENOSCOPY (EGD) WITH PROPOFOL  N/A 07/20/2023   Procedure: ESOPHAGOGASTRODUODENOSCOPY (EGD) WITH PROPOFOL ;  Surgeon: Luke Salaam, MD;  Location: [facility] ENDOSCOPY;  Service: Gastroenterology;  Laterality: N/A;   GIVENS CAPSULE STUDY N/A 08/03/2023   Procedure: GIVENS CAPSULE STUDY;  Surgeon: Luke Salaam, MD;  Location: [facility] ENDOSCOPY;  Service: Gastroenterology;  Laterality: N/A;   HOT HEMOSTASIS  07/20/2023   Procedure: HOT HEMOSTASIS (ARGON PLASMA COAGULATION/BICAP);  Surgeon: Luke Salaam, MD;  Location: [facility] ENDOSCOPY;  Service: Gastroenterology;;   POLYPECTOMY  07/20/2023   Procedure: POLYPECTOMY;  Surgeon: Luke Salaam, MD;  Location: [facility] ENDOSCOPY;  Service: Gastroenterology;;   right wrist fracture     TUBAL LIGATION     WRIST SURGERY Left     Medical History: Past Medical History:  Diagnosis Date   Arthritis    Asthma    Crohn's disease (HCC)    Emphysema of lung (HCC)    GERD (gastroesophageal reflux disease)    Hip fracture (HCC) 01/26/2017   History of chicken pox    History  of heart attack    Hx of completed stroke    Hypercholesteremia    Hypertension    Parkinson disease (HCC)    Pneumonia    Seasonal allergies    Sepsis (HCC)    Septic shock (HCC) 10/08/2016   UTI (lower urinary tract infection)     Family History: Family History  Problem Relation Age of Onset   Cancer Sister        ovarian/uterine/breast   Breast cancer Sister 53   Cancer Sister        breast   Heart disease Sister    Breast cancer Sister 76   Heart disease Father    Cancer Father        liver   Pancreatic cancer Mother    Heart disease Brother    Heart attack Brother    Cancer Brother        liver   Heart disease Sister    Diabetes Maternal Grandmother     Social History: Social History  Socioeconomic History   Marital status: Widowed    Spouse name: Not on file   Number of children: 3   Years of education: Not on file   Highest education level: Some college, no degree  Occupational History   Occupation: retired  Tobacco Use   Smoking status: Former    Current packs/day: 0.00    Average packs/day: 1 pack/day for 30.0 years (30.0 ttl pk-yrs)    Types: Cigarettes    Start date: 08/01/1978    Quit date: 08/01/2008    Years since quitting: 15.7   Smokeless tobacco: Never  Vaping Use   Vaping status: Never Used  Substance and Sexual Activity   Alcohol use: Not Currently    Alcohol/week: 1.0 standard drink of alcohol    Types: 1 Shots of liquor per week   Drug use: No   Sexual activity: Not on file  Other Topics Concern   Not on file  Social History Narrative   Not on file   Social Drivers of Health   Financial Resource Strain: Low Risk  (02/16/2024)   Overall Financial Resource Strain (CARDIA)    Difficulty of Paying Living Expenses: Not hard at all  Food Insecurity: No Food Insecurity (02/16/2024)   Hunger Vital Sign    Worried About Running Out of Food in the Last Year: Never true    Ran Out of Food in the Last Year: Never true  Transportation  Needs: No Transportation Needs (02/16/2024)   PRAPARE - Administrator, Civil Service (Medical): No    Lack of Transportation (Non-Medical): No  Physical Activity: Insufficiently Active (02/16/2024)   Exercise Vital Sign    Days of Exercise per Week: 5 days    Minutes of Exercise per Session: 10 min  Stress: No Stress Concern Present (02/16/2024)   [employer] of Occupational Health - Occupational Stress Questionnaire    Feeling of Stress : Not at all  Social Connections: Socially Isolated (02/16/2024)   Social Connection and Isolation Panel [NHANES]    Frequency of Communication with Friends and Family: More than three times a week    Frequency of Social Gatherings with Friends and Family: More than three times a week    Attends Religious Services: Never    Database administrator or Organizations: No    Attends Banker Meetings: Never    Marital Status: Widowed  Intimate Partner Violence: Not At Risk (02/16/2024)   Humiliation, Afraid, Rape, and Kick questionnaire    Fear of Current or Ex-Partner: No    Emotionally Abused: No    Physically Abused: No    Sexually Abused: No    Vital Signs: Blood pressure 122/75, pulse 85, temperature 98 F (36.7 C), resp. rate 16, height 5\' 1"  (1.549 m), weight 134 lb (60.8 kg), SpO2 99%.  Examination: General Appearance: The patient is well-developed, well-nourished, and in no distress. Skin: Gross inspection of skin unremarkable. Head: normocephalic, no gross deformities. Eyes: no gross deformities noted. ENT: ears appear grossly normal no exudates. Neck: Supple. No thyromegaly. No LAD. Respiratory: no rhonchi noted. Cardiovascular: Normal S1 and S2 without murmur or rub. Extremities: No cyanosis. pulses are equal. Neurologic: Alert and oriented. No involuntary movements.  LABS: Recent Results (from the past 2160 hours)  Iron  and TIBC([facility])     Status: None   Collection Time: 03/20/24  2:42 PM  Result  Value Ref Range   Iron  87 28 - 170 ug/dL   TIBC 191 478 - 295  ug/dL   Saturation Ratios 23 10.4 - 31.8 %   UIBC 290 ug/dL    Comment: Performed at [facility], [address] [place], [place] [address]  Ferritin     Status: None   Collection Time: 03/20/24  2:42 PM  Result Value Ref Range   Ferritin 35 11 - 307 ng/mL    Comment: Performed at [facility], [address] [place], [place] [address]  Basic Metabolic Panel - Cancer Center Only     Status: Abnormal   Collection Time: 03/20/24  2:42 PM  Result Value Ref Range   Sodium 137 135 - 145 mmol/L   Potassium 3.9 3.5 - 5.1 mmol/L   Chloride 102 98 - 111 mmol/L   CO2 26 22 - 32 mmol/L   Glucose, Bld 129 (H) 70 - 99 mg/dL    Comment: Glucose reference range applies only to samples taken after fasting for at least 8 hours.   BUN 18 8 - 23 mg/dL   Creatinine 2.95 6.21 - 1.00 mg/dL   Calcium  8.6 (L) 8.9 - 10.3 mg/dL   GFR, Estimated >30 >86 mL/min    Comment: (NOTE) Calculated using the CKD-EPI Creatinine Equation (2021)    Anion gap 9 5 - 15    Comment: Performed at [facility], [address] [place], [place] [address]  CBC with Differential (Cancer Center Only)     Status: Abnormal   Collection Time: 03/20/24  2:42 PM  Result Value Ref Range   WBC Count 10.2 4.0 - 10.5 K/uL   RBC 3.97 3.87 - 5.11 MIL/uL   Hemoglobin 11.8 (L) 12.0 - 15.0 g/dL   HCT 96.2 95.2 - 84.1 %   MCV 91.4 80.0 - 100.0 fL   MCH 29.7 26.0 - 34.0 pg   MCHC 32.5 30.0 - 36.0 g/dL   RDW 32.4 40.1 - 02.7 %   Platelet Count 169 150 - 400 K/uL   nRBC 0.0 0.0 - 0.2 %   Neutrophils Relative % 70 %   Neutro Abs 7.1 1.7 - 7.7 K/uL   Lymphocytes Relative 20 %   Lymphs Abs 2.1 0.7 - 4.0 K/uL   Monocytes Relative 7 %   Monocytes Absolute 0.7 0.1 - 1.0 K/uL   Eosinophils Relative 2 %   Eosinophils Absolute 0.2 0.0 - 0.5 K/uL   Basophils Relative 0 %   Basophils Absolute 0.0 0.0 - 0.1 K/uL   Immature Granulocytes 1 %   Abs  Immature Granulocytes 0.14 (H) 0.00 - 0.07 K/uL    Comment: Performed at [facility], [address] [place], [place] [address]    Radiology: DG Shoulder Right Result Date: 03/22/2024 CLINICAL DATA:  Right shoulder pain. History of fracture in February 2023. EXAM: RIGHT SHOULDER - 2+ VIEW COMPARISON:  Right shoulder radiograph dated 01/06/2022. FINDINGS: Diffuse osseous demineralization. No acute fracture or dislocation. Remote healed fracture deformity of the right humeral head and neck. Moderate-to-severe glenohumeral joint arthritis with bulky hypertrophic osseous change/osteophytosis at the healed fracture margin of the inferomedial humeral head and neck. Mild degenerative changes of the acromioclavicular joint. Partially visualized fixation hardware at the elbow. Remote appearing right-sided rib fractures. IMPRESSION: 1. No acute osseous abnormality. 2. Remote healed fracture deformity of the right humeral head and neck with moderate-to-severe glenohumeral joint arthritis. 3. Remote appearing right-sided rib fractures. Electronically Signed   By: Mannie Seek M.D.   On: 03/22/2024 15:45    No results found.  DG Shoulder Right  Result Date: 03/22/2024 CLINICAL DATA:  Right shoulder pain. History of fracture in February 2023. EXAM: RIGHT SHOULDER - 2+ VIEW COMPARISON:  Right shoulder radiograph dated 01/06/2022. FINDINGS: Diffuse osseous demineralization. No acute fracture or dislocation. Remote healed fracture deformity of the right humeral head and neck. Moderate-to-severe glenohumeral joint arthritis with bulky hypertrophic osseous change/osteophytosis at the healed fracture margin of the inferomedial humeral head and neck. Mild degenerative changes of the acromioclavicular joint. Partially visualized fixation hardware at the elbow. Remote appearing right-sided rib fractures. IMPRESSION: 1. No acute osseous abnormality. 2. Remote healed fracture deformity of the right humeral head and  neck with moderate-to-severe glenohumeral joint arthritis. 3. Remote appearing right-sided rib fractures. Electronically Signed   By: Mannie Seek M.D.   On: 03/22/2024 15:45    Assessment and Plan: Patient Active Problem List   Diagnosis Date Noted   Adenomatous polyp of colon 07/20/2023   Symptomatic anemia 11/10/2022   Carotid artery stenosis 08/11/2022   Low blood pressure 01/28/2022   Subclavian steal syndrome of left subclavian artery 01/16/2022   Status post hardware removal 11/20/2021   Periprosthetic fracture around internal prosthetic right elbow joint 07/25/2020   Pain in joint of right elbow 07/19/2020   Closed fracture of distal end of left radius 10/27/2019   Closed Monteggia's fracture of right arm 09/24/2019   Crohn's disease of colon without complication (HCC) 09/05/2019   Hearing loss 06/01/2019   Vertigo 06/01/2019   Hypokalemia 07/22/2018   Oral thrush 07/22/2018   Gastroenteritis 07/16/2018   Primary parkinsonism (HCC) 01/05/2018   SIADH (syndrome of inappropriate ADH production) (HCC) 10/01/2017   Spinal stenosis 10/01/2017   Elevated troponin I level 11/18/2016   COPD (chronic obstructive pulmonary disease) with chronic bronchitis (HCC) 08/12/2016   Asthma without status asthmaticus 03/31/2016   Osteoporosis 03/31/2016   Fatty liver determined by biopsy 09/17/2015   Parotid swelling 09/13/2015   Urticaria 09/13/2015   History of CVA (cerebrovascular accident) 08/21/2015   Senile purpura (HCC) 08/21/2015   Atrial tachycardia (HCC) 08/13/2015   Eczema 07/10/2015   Allergic rhinitis 06/26/2015   Anxiety 04/23/2015   Generalized weakness 03/06/2015   Primary cardiomyopathy (HCC) 03/05/2015   CAD (coronary artery disease) 03/05/2015   Benign essential HTN 02/28/2015   Mixed hyperlipidemia 09/06/2014   Acid reflux 08/01/2014   Gastro-esophageal reflux disease without esophagitis 08/01/2014    1. COPD (chronic obstructive pulmonary disease) with  chronic bronchitis (HCC) (Primary) She will continue with her inhalers as prescribed. Patient is on albuterol  and also is on breztri   2. Gastroesophageal reflux disease without esophagitis She will remain on PPi for the reflux  3. Primary parkinsonism (HCC) Continue with her current meds   General Counseling: I have discussed the findings of the evaluation and examination with Melinna.  I have also discussed any further diagnostic evaluation thatmay be needed or ordered today. Dylanie verbalizes understanding of the findings of todays visit. We also reviewed her medications today and discussed drug interactions and side effects including but not limited excessive drowsiness and altered mental states. We also discussed that there is always a risk not just to her but also people around her. she has been encouraged to call the office with any questions or concerns that should arise related to todays visit.  No orders of the defined types were placed in this encounter.    Time spent: 51  I have personally obtained a history, examined the patient, evaluated laboratory and imaging results, formulated the assessment and plan and  placed orders.    Cordie Deters, MD [facility] Pulmonary and Critical Care Sleep medicine

## 2024-04-12 ENCOUNTER — Other Ambulatory Visit: Payer: Self-pay

## 2024-04-12 ENCOUNTER — Other Ambulatory Visit (HOSPITAL_COMMUNITY): Payer: Self-pay

## 2024-04-14 ENCOUNTER — Other Ambulatory Visit: Payer: Self-pay

## 2024-04-14 DIAGNOSIS — M25511 Pain in right shoulder: Secondary | ICD-10-CM | POA: Diagnosis not present

## 2024-04-17 ENCOUNTER — Other Ambulatory Visit: Payer: Self-pay | Admitting: Family Medicine

## 2024-04-17 NOTE — Telephone Encounter (Unsigned)
 Copied from CRM [id number]. Topic: Clinical - Medication Refill >> Apr 17, 2024 10:09 AM Fonda T wrote: Medication: Budeson-Glycopyrrol-Formoterol  (BREZTRI  AEROSPHERE) 160-9-4.8 MCG/ACT AERO  Has the patient contacted their pharmacy? Yes (Agent: If no, request that the patient contact the pharmacy for the refill. If patient does not wish to contact the pharmacy document the reason why and proceed with request.) (Agent: If yes, when and what did the pharmacy advise?)  This is the patient's preferred pharmacy:  [facility] [address] [place] [address] Phone: [phone] Fax: [phone]   Is this the correct pharmacy for this prescription? Yes If no, delete pharmacy and type the correct one.   Has the prescription been filled recently? Yes  Is the patient out of the medication? No Patient states has 3 months left, but she is requesting form be completed by Timoteo Force for additional refills before she is out of medication  Has the patient been seen for an appointment in the last year OR does the patient have an upcoming appointment? Yes  Can we respond through MyChart? Yes  Agent: Please be advised that Rx refills may take up to 3 business days. We ask that you follow-up with your pharmacy.

## 2024-04-18 NOTE — Telephone Encounter (Signed)
 Requested medication (s) are due for refill today: yes  Requested medication (s) are on the active medication list: hc med  Last refill:  09/10/23  Future visit scheduled: no  Notes to clinic:  hx provider   Requested Prescriptions  Pending Prescriptions Disp Refills   budesonide -glycopyrrolate -formoterol  (BREZTRI  AEROSPHERE) 160-9-4.8 MCG/ACT AERO inhaler      Sig: Inhale 2 puffs into the lungs in the morning and at bedtime. Getting from [facility]     Off-Protocol Failed - 04/18/2024 12:04 PM      Failed - Medication not assigned to a protocol, review manually.      Passed - Valid encounter within last 12 months    Recent Outpatient Visits           3 weeks ago Chronic right shoulder pain   [facility] [place], [place]

## 2024-04-19 NOTE — Procedures (Signed)
 [facility] [address] [place], [address]    Complete Pulmonary Function Testing Interpretation:  FINDINGS:  The forced vital capacity is normal.  FEV1 is 1.43 L which is 78% of predicted and is mildly decreased.  F1 FVC ratio was mildly decreased.  Postbronchodilator there is no significant change in FEV1.  Total lung capacity is normal.  Residual volume is increased.  FRC is increased.  DLCO was moderately decreased.  IMPRESSION:  This pulmonary function study is consistent with mild obstructive lung disease  Cordie Deters, MD [facility] Pulmonary Critical Care Medicine Sleep Medicine

## 2024-04-22 ENCOUNTER — Other Ambulatory Visit: Payer: Self-pay | Admitting: Physician Assistant

## 2024-04-22 DIAGNOSIS — J4489 Other specified chronic obstructive pulmonary disease: Secondary | ICD-10-CM

## 2024-04-22 DIAGNOSIS — R0602 Shortness of breath: Secondary | ICD-10-CM

## 2024-04-24 ENCOUNTER — Other Ambulatory Visit (HOSPITAL_COMMUNITY): Payer: Self-pay

## 2024-04-24 MED ORDER — ALBUTEROL SULFATE HFA 108 (90 BASE) MCG/ACT IN AERS
2.0000 | INHALATION_SPRAY | Freq: Four times a day (QID) | RESPIRATORY_TRACT | 1 refills | Status: DC | PRN
Start: 2024-04-24 — End: 2024-07-03
  Filled 2024-05-08: qty 18, 25d supply, fill #0
  Filled 2024-06-14: qty 18, 25d supply, fill #1

## 2024-04-25 ENCOUNTER — Telehealth: Payer: Self-pay

## 2024-04-25 NOTE — Telephone Encounter (Signed)
 Gave pt a call pt is coming up due to re-enroll on PAP [facility] Breztri ,from last note pt had a surplus until July/2025.

## 2024-04-25 NOTE — Telephone Encounter (Signed)
 Pt return call and wants us  to mail out pap,pt said she has enough until August/2025. Mail out pt portion today and faxing provider portion today.

## 2024-04-26 ENCOUNTER — Other Ambulatory Visit: Payer: Self-pay | Admitting: Nurse Practitioner

## 2024-04-26 ENCOUNTER — Other Ambulatory Visit: Payer: Self-pay

## 2024-04-26 DIAGNOSIS — M79601 Pain in right arm: Secondary | ICD-10-CM

## 2024-04-26 DIAGNOSIS — G8929 Other chronic pain: Secondary | ICD-10-CM

## 2024-04-27 NOTE — Telephone Encounter (Signed)
 Requested medications are due for refill today.  A little too soon  Requested medications are on the active medications list.  yes  Last refill. 04/04/2024 #60 0 rf  Future visit scheduled.   no  Notes to clinic.  Labs are expired.    Requested Prescriptions  Pending Prescriptions Disp Refills   celecoxib  (CELEBREX ) 100 MG capsule 60 capsule 0    Sig: Take 1 capsule (100 mg total) by mouth 2 (two) times daily.     Analgesics:  COX2 Inhibitors Failed - 04/27/2024  3:20 PM      Failed - Manual Review: Labs are only required if the patient has taken medication for more than 8 weeks.      Failed - HGB in normal range and within 360 days    Hemoglobin  Date Value Ref Range Status  03/20/2024 11.8 (L) 12.0 - 15.0 g/dL Final  16/08/9603 8.3 (L) 11.1 - 15.9 g/dL Final         Failed - AST in normal range and within 360 days    AST  Date Value Ref Range Status  03/22/2023 30 15 - 41 U/L Final   SGOT(AST)  Date Value Ref Range Status  04/26/2014 27 15 - 37 Unit/L Final         Failed - ALT in normal range and within 360 days    ALT  Date Value Ref Range Status  03/22/2023 7 0 - 44 U/L Final   SGPT (ALT)  Date Value Ref Range Status  04/26/2014 37 12 - 78 U/L Final         Passed - Cr in normal range and within 360 days    Creatinine  Date Value Ref Range Status  03/20/2024 0.81 0.44 - 1.00 mg/dL Final  54/07/8118 1.47 mg/dL Final    Comment:    8.29-5.62 NOTE: New Reference Range  01/22/15          Passed - HCT in normal range and within 360 days    HCT  Date Value Ref Range Status  03/20/2024 36.3 36.0 - 46.0 % Final   Hematocrit  Date Value Ref Range Status  10/26/2022 28.2 (L) 34.0 - 46.6 % Final         Passed - eGFR is 30 or above and within 360 days    EGFR (African American)  Date Value Ref Range Status  03/10/2015 >60  Final   GFR calc Af Amer  Date Value Ref Range Status  07/25/2020 83 >59 mL/min/1.73 Final    Comment:    **[facility] currently  reports eGFR in compliance with the current**   recommendations of the [employer]. [facility] will   update reporting as new guidelines are published from the NKF-ASN   Task force.    EGFR (Non-African Amer.)  Date Value Ref Range Status  03/10/2015 >60  Final    Comment:    eGFR values <74mL/min/1.73 m2 may be an indication of chronic kidney disease (CKD). Calculated eGFR is useful in patients with stable renal function. The eGFR calculation will not be reliable in acutely ill patients when serum creatinine is changing rapidly. It is not useful in patients on dialysis. The eGFR calculation may not be applicable to patients at the low and high extremes of body sizes, pregnant women, and vegetarians.    GFR, Estimated  Date Value Ref Range Status  03/20/2024 >60 >60 mL/min Final    Comment:    (NOTE) Calculated using the CKD-EPI Creatinine  Equation (2021)    eGFR  Date Value Ref Range Status  10/26/2022 88 >59 mL/min/1.73 Final         Passed - Patient is not pregnant      Passed - Valid encounter within last 12 months    Recent Outpatient Visits           1 month ago Chronic right shoulder pain   [facility] [place], [place]

## 2024-04-27 NOTE — Telephone Encounter (Signed)
 Received provider portion application for AZ&ME(Breztri ) today ,awaiting on pt portion of application.

## 2024-04-28 ENCOUNTER — Other Ambulatory Visit (HOSPITAL_COMMUNITY): Payer: Self-pay

## 2024-04-28 MED ORDER — CELECOXIB 100 MG PO CAPS
100.0000 mg | ORAL_CAPSULE | Freq: Two times a day (BID) | ORAL | 1 refills | Status: DC
  Filled 2024-05-12 – 2024-05-23 (×2): qty 60, 30d supply, fill #0

## 2024-05-01 ENCOUNTER — Other Ambulatory Visit: Payer: Self-pay

## 2024-05-03 NOTE — Telephone Encounter (Signed)
 Gave pt a call following up on pt's PAP checking to see if pt received application AZ&ME Breztri . Pt did not answer left a HIPAA VM.

## 2024-05-03 NOTE — Telephone Encounter (Signed)
 Pt return call explaining she has not mail back application for AZ&ME(Breztri ) ,she has not had a chance to fill then in but will try to do it as soon as possible and will mail back.

## 2024-05-08 ENCOUNTER — Other Ambulatory Visit: Payer: Self-pay

## 2024-05-08 ENCOUNTER — Telehealth: Payer: Self-pay

## 2024-05-08 ENCOUNTER — Encounter: Payer: Self-pay | Admitting: Internal Medicine

## 2024-05-08 ENCOUNTER — Other Ambulatory Visit (HOSPITAL_COMMUNITY): Payer: Self-pay

## 2024-05-08 NOTE — Telephone Encounter (Unsigned)
 Copied from CRM [id number]. Topic: General - Other >> May 05, 2024  4:56 PM Santiya F wrote: Reason for CRM: Patient is calling in because she wants to let [facility] know she received her paperwork and she will be completing it and mailing it back.

## 2024-05-09 NOTE — Telephone Encounter (Signed)
 Received pt portion AZ&ME pap will be faxing it to AZ&ME along provider portion and will follow up in a few days.

## 2024-05-09 NOTE — Telephone Encounter (Signed)
 Per AZ&ME pt has been APPROVED thru 11/15/24 on Breztri ,try calling pt on home and mobile number pt did not answer,Mobile number is not set up to leave msg.pt will be receiving medication with in 7-10 business days.

## 2024-05-12 ENCOUNTER — Other Ambulatory Visit: Payer: Self-pay | Admitting: Family Medicine

## 2024-05-12 ENCOUNTER — Other Ambulatory Visit: Payer: Self-pay

## 2024-05-12 ENCOUNTER — Other Ambulatory Visit (HOSPITAL_COMMUNITY): Payer: Self-pay

## 2024-05-12 DIAGNOSIS — G8929 Other chronic pain: Secondary | ICD-10-CM

## 2024-05-12 DIAGNOSIS — M79601 Pain in right arm: Secondary | ICD-10-CM

## 2024-05-12 NOTE — Telephone Encounter (Signed)
 Copied from CRM [id number]. Topic: Clinical - Medication Refill >> May 12, 2024 12:40 PM Ivette P wrote: Medication: celecoxib  (CELEBREX ) 100 MG capsule  Has the patient contacted their pharmacy? Yes (Agent: If no, request that the patient contact the pharmacy for the refill. If patient does not wish to contact the pharmacy document the reason why and proceed with request.) (Agent: If yes, when and what did the pharmacy advise?)  This is the patient's preferred pharmacy:  [facility] - [place], [place] - [address] [place] [address] Phone: [phone] Fax: [phone]  Is this the correct pharmacy for this prescription? Yes If no, delete pharmacy and type the correct one.   Has the prescription been filled recently? Yes, 04/28/2024  Is the patient out of the medication? Yes, pt has 1 left for today and 2 for tomorrow.   Has the patient been seen for an appointment in the last year OR does the patient have an upcoming appointment? Yes, 03/22/2024 - las time seen   Can we respond through MyChart? No  Agent: Please be advised that Rx refills may take up to 3 business days. We ask that you follow-up with your pharmacy.

## 2024-05-15 ENCOUNTER — Other Ambulatory Visit: Payer: Self-pay

## 2024-05-15 MED ORDER — CELECOXIB 100 MG PO CAPS
100.0000 mg | ORAL_CAPSULE | Freq: Two times a day (BID) | ORAL | 1 refills | Status: DC
  Filled 2024-05-24 – 2024-06-15 (×3): qty 60, 30d supply, fill #0
  Filled ????-??-??: fill #0

## 2024-05-15 NOTE — Telephone Encounter (Unsigned)
 Copied from CRM [id number]. Topic: Clinical - Medication Refill >> May 12, 2024 12:40 PM Ivette P wrote: Medication: celecoxib  (CELEBREX ) 100 MG capsule  Has the patient contacted their pharmacy? Yes (Agent: If no, request that the patient contact the pharmacy for the refill. If patient does not wish to contact the pharmacy document the reason why and proceed with request.) (Agent: If yes, when and what did the pharmacy advise?)  This is the patient's preferred pharmacy:  [facility] - [place], [place] - [address] [place] [address] Phone: [phone] Fax: [phone]  Is this the correct pharmacy for this prescription? Yes If no, delete pharmacy and type the correct one.   Has the prescription been filled recently? Yes, 04/28/2024  Is the patient out of the medication? Yes, pt has 1 left for today and 2 for tomorrow.   Has the patient been seen for an appointment in the last year OR does the patient have an upcoming appointment? Yes, 03/22/2024 - las time seen   Can we respond through MyChart? No  Agent: Please be advised that Rx refills may take up to 3 business days. We ask that you follow-up with your pharmacy. >> May 15, 2024 11:12 AM Delon DASEN wrote: Still waiting on refill- would like an update- [phone] >> May 12, 2024  3:53 PM Emylou G wrote: Pls update pharmacy to this - she is out of town:  [facility] [address], [place], [place] [address]

## 2024-05-23 ENCOUNTER — Other Ambulatory Visit: Payer: Self-pay | Admitting: Nurse Practitioner

## 2024-05-23 ENCOUNTER — Other Ambulatory Visit: Payer: Self-pay

## 2024-05-23 ENCOUNTER — Encounter: Payer: Self-pay | Admitting: Internal Medicine

## 2024-05-23 DIAGNOSIS — M79601 Pain in right arm: Secondary | ICD-10-CM

## 2024-05-23 DIAGNOSIS — G8929 Other chronic pain: Secondary | ICD-10-CM

## 2024-05-24 ENCOUNTER — Other Ambulatory Visit (HOSPITAL_COMMUNITY): Payer: Self-pay

## 2024-05-24 ENCOUNTER — Other Ambulatory Visit: Payer: Medicare PPO | Admitting: Pharmacist

## 2024-05-24 ENCOUNTER — Other Ambulatory Visit: Payer: Self-pay

## 2024-05-24 DIAGNOSIS — J4489 Other specified chronic obstructive pulmonary disease: Secondary | ICD-10-CM

## 2024-05-24 NOTE — Progress Notes (Signed)
 05/24/2024 Name: Lori Brady MRN: [id number] DOB: 02-03-1946  Chief Complaint  Patient presents with   Medication Assistance    Lori Brady is a 78 y.o. year old female who presented for a telephone visit.   They were referred to the pharmacist by their PCP for assistance in managing complex medication management.      Subjective:   Care Team: Primary Care Provider: Myrla Jon HERO, MD Neurologist: Maree Jannett Hering, MD; Next Scheduled Visit: 07/03/2024 Gastroenterologist: Therisa Bi, MD Pulmonologist: Fernand Elfreda LABOR, MD Hematologist: Rennie Cindy SAUNDERS, MD;    Medication Access/Adherence  Current Pharmacy:  [facility] [address] [place] [address] Phone: [phone] Fax: [phone]  [facility] - [place], [place] - [address] [place] [address] Phone: [phone] Fax: [phone]  [id number] - [place], [place] - [address] AT [facility] [address] [place] [place] [address] Phone: [phone] Fax: [phone]   Patient reports affordability concerns with their medications: No  Patient reports access/transportation concerns to their pharmacy: No  Patient reports adherence concerns with their medications:  No     COPD:  Patient followed by Dr. Fernand at [facility]  Current medications: - Breztri  Inhaler - 2 puffs twice daily - albuterol  HFA - 2 puffs every 6 hours as needed for wheezing/shortness of breath  Confirms using Breztri  maintenance inhaler consistently and rinsing and spitting out after each use  Current medication access support: enrolled in patient assistance for Breztri  inhaler from AZ&Me through 11/15/2024  Confirms received 3 month supply in mail last week   Objective:  Lab Results  Component Value Date   CREATININE 0.81 03/20/2024   BUN 18 03/20/2024   NA 137 03/20/2024   K 3.9  03/20/2024   CL 102 03/20/2024   CO2 26 03/20/2024   Current Outpatient Medications on File Prior to Visit  Medication Sig Dispense Refill   albuterol  (VENTOLIN  HFA) 108 (90 Base) MCG/ACT inhaler Inhale 2 puffs into the lungs every 6 (six) hours as needed for wheezing or shortness of breath. 18 g 1   alendronate  (FOSAMAX ) 70 MG tablet Take 1 tablet (70 mg total) by mouth once a week -take on an empty stomach before breakfast with 8 oz of water. Remaine upright for 30 minutes. 12 tablet 1   aspirin  81 MG EC tablet Take 81 mg by mouth daily with breakfast.     atorvastatin  (LIPITOR) 40 MG tablet Take 1 tablet (40 mg total) by mouth daily. 90 tablet 1   azelastine  (ASTELIN ) 0.1 % nasal spray Place 2 sprays into both nostrils daily at 6 (six) AM. 30 mL 11   Budeson-Glycopyrrol-Formoterol  (BREZTRI  AEROSPHERE) 160-9-4.8 MCG/ACT AERO Inhale 2 puffs into the lungs in the morning and at bedtime. Getting from AZ&ME     carbidopa -levodopa  (SINEMET  CR) 50-200 MG tablet Take 1 tablet by mouth at bedtime.     carbidopa -levodopa  (SINEMET  CR) 50-200 MG tablet Take 1 tablet by mouth at bedtime 90 tablet 3   carbidopa -levodopa  (SINEMET  IR) 25-100 MG tablet Take 3 tablets with breakfast, 2 tablets with lunch, 2 tablets with supper.     carbidopa -levodopa  (SINEMET  IR) 25-100 MG tablet Take 3 tablets every morning AND 2 tablets 2 (two) times daily. 210 tablet 11   celecoxib  (CELEBREX ) 100 MG capsule Take 1 capsule (100 mg total) by mouth 2 (two) times  daily. 60 capsule 1   cetirizine  (ZYRTEC ) 10 MG tablet Take 1 tablet (10 mg total) by mouth daily. 30 tablet 1   Cholecalciferol  (VITAMIN D ) 2000 UNITS CAPS Take 1 capsule by mouth daily.     citalopram  (CELEXA ) 20 MG tablet Take 1 tablet (20 mg total) by mouth daily. 90 tablet 1   ferrous sulfate 325 (65 FE) MG EC tablet Take 650 mg by mouth daily with breakfast.     ipratropium-albuterol  (DUONEB) 0.5-2.5 (3) MG/3ML SOLN Take 3 mLs by nebulization every 4 (four) hours  as needed. 360 mL 3   melatonin 5 MG TABS Take 1 tablet by mouth at bedtime.     montelukast  (SINGULAIR ) 10 MG tablet Take 1 tablet (10 mg total) by mouth every morning. 90 tablet 1   nystatin  ointment (MYCOSTATIN ) apply to the affected area 2 to 3 times daily 15 g 1   omeprazole  (PRILOSEC) 40 MG capsule Take 1 capsule (40 mg total) by mouth in the morning and at bedtime. 180 capsule 3   Probiotic Product (PROBIOTIC DAILY PO) Take 1 tablet by mouth daily.     sodium fluoride  (PREVIDENT 5000 DRY MOUTH) 1.1 % GEL dental gel Use as directed 100 mL 5   Sodium Sulfate-Mag Sulfate-KCl (SUTAB ) [phone] MG TABS At 5 PM take 12 tablets using the 8 oz cup provided in the kit drinking 5 cups of water and 5 hours before your procedure repeat the same process. 24 tablet 0   No current facility-administered medications on file prior to visit.       Assessment/Plan:   COPD: - Reviewed appropriate inhaler technique. - Patient to follow up with AZ&Me as needed for refills of Breztri  inhaler   Follow Up Plan: Clinical Pharmacist will follow up with patient by telephone on 09/27/2024 at 1:00 PM   Sharyle Sia, PharmD, [facility] [phone]

## 2024-05-24 NOTE — Patient Instructions (Signed)
 Goals Addressed             This Visit's Progress    Pharmacy Goals       Please follow up with [facility] at [phone] as needed for refills of your medications  If you need to reach out to patient assistance programs regarding refills or to find out the status of your application, you can do so by calling:  AZ&Me at [phone]   Thank you!  Sharyle Sia, PharmD, [facility] [phone]

## 2024-05-25 ENCOUNTER — Other Ambulatory Visit (HOSPITAL_COMMUNITY): Payer: Self-pay

## 2024-05-25 ENCOUNTER — Other Ambulatory Visit: Payer: Self-pay

## 2024-06-05 ENCOUNTER — Other Ambulatory Visit: Payer: Self-pay | Admitting: Family Medicine

## 2024-06-05 ENCOUNTER — Other Ambulatory Visit: Payer: Self-pay | Admitting: Nurse Practitioner

## 2024-06-05 ENCOUNTER — Other Ambulatory Visit: Payer: Self-pay

## 2024-06-05 ENCOUNTER — Other Ambulatory Visit (HOSPITAL_COMMUNITY): Payer: Self-pay

## 2024-06-05 DIAGNOSIS — J45909 Unspecified asthma, uncomplicated: Secondary | ICD-10-CM

## 2024-06-05 DIAGNOSIS — F419 Anxiety disorder, unspecified: Secondary | ICD-10-CM

## 2024-06-05 DIAGNOSIS — E7800 Pure hypercholesterolemia, unspecified: Secondary | ICD-10-CM

## 2024-06-05 DIAGNOSIS — J301 Allergic rhinitis due to pollen: Secondary | ICD-10-CM

## 2024-06-05 MED ORDER — CARBIDOPA-LEVODOPA 25-100 MG PO TABS
ORAL_TABLET | ORAL | 11 refills | Status: AC
  Filled 2024-06-22: qty 210, fill #0
  Filled 2024-06-22: qty 150, 30d supply, fill #0
  Filled 2024-06-22: qty 210, 30d supply, fill #0
  Filled 2024-07-17: qty 150, 30d supply, fill #1
  Filled 2024-08-15: qty 150, 30d supply, fill #2
  Filled 2024-09-14: qty 150, 30d supply, fill #3
  Filled 2024-09-14: qty 210, 30d supply, fill #3
  Filled 2024-10-10: qty 210, 30d supply, fill #4
  Filled 2024-11-07: qty 210, 30d supply, fill #5
  Filled 2024-12-07: qty 210, 30d supply, fill #6

## 2024-06-05 MED ORDER — ATORVASTATIN CALCIUM 40 MG PO TABS
40.0000 mg | ORAL_TABLET | Freq: Every day | ORAL | 0 refills | Status: DC
  Filled 2024-06-22 (×2): qty 30, 30d supply, fill #0
  Filled 2024-06-22: qty 60, 60d supply, fill #0
  Filled 2024-07-25 (×2): qty 30, 30d supply, fill #1

## 2024-06-05 MED ORDER — MONTELUKAST SODIUM 10 MG PO TABS
10.0000 mg | ORAL_TABLET | Freq: Every morning | ORAL | 0 refills | Status: DC
  Filled 2024-06-22: qty 60, 60d supply, fill #0
  Filled 2024-06-22 (×2): qty 30, 30d supply, fill #0
  Filled 2024-07-25 (×2): qty 30, 30d supply, fill #1

## 2024-06-05 MED ORDER — CETIRIZINE HCL 10 MG PO TABS
10.0000 mg | ORAL_TABLET | Freq: Every day | ORAL | 1 refills | Status: DC
  Filled 2024-06-22 (×3): qty 30, 30d supply, fill #0
  Filled 2024-07-25 (×2): qty 30, 30d supply, fill #1

## 2024-06-05 MED ORDER — CITALOPRAM HYDROBROMIDE 20 MG PO TABS
20.0000 mg | ORAL_TABLET | Freq: Every day | ORAL | 0 refills | Status: DC
  Filled 2024-06-22: qty 60, 60d supply, fill #0
  Filled 2024-06-22 (×2): qty 30, 30d supply, fill #0
  Filled 2024-07-25 (×2): qty 30, 30d supply, fill #1

## 2024-06-05 MED ORDER — CARBIDOPA-LEVODOPA ER 50-200 MG PO TBCR
1.0000 | EXTENDED_RELEASE_TABLET | Freq: Every day | ORAL | 3 refills | Status: AC
  Filled 2024-06-22: qty 90, 90d supply, fill #0
  Filled 2024-06-22 (×2): qty 30, 30d supply, fill #0
  Filled 2024-07-25: qty 30, 30d supply, fill #1
  Filled 2024-08-22 – 2024-08-30 (×2): qty 30, 30d supply, fill #2
  Filled 2024-09-25 – 2024-09-26 (×2): qty 30, 30d supply, fill #3
  Filled 2024-10-24 – 2024-10-31 (×3): qty 30, 30d supply, fill #4
  Filled 2024-11-24: qty 30, 30d supply, fill #5

## 2024-06-06 LAB — PULMONARY FUNCTION TEST

## 2024-06-08 ENCOUNTER — Other Ambulatory Visit: Payer: Self-pay

## 2024-06-14 ENCOUNTER — Other Ambulatory Visit: Payer: Self-pay

## 2024-06-14 ENCOUNTER — Other Ambulatory Visit (HOSPITAL_COMMUNITY): Payer: Self-pay

## 2024-06-14 ENCOUNTER — Encounter: Payer: Self-pay | Admitting: Internal Medicine

## 2024-06-15 ENCOUNTER — Other Ambulatory Visit: Payer: Self-pay

## 2024-06-16 ENCOUNTER — Other Ambulatory Visit: Payer: Self-pay | Admitting: Family Medicine

## 2024-06-16 ENCOUNTER — Other Ambulatory Visit (HOSPITAL_COMMUNITY): Payer: Self-pay

## 2024-06-16 ENCOUNTER — Other Ambulatory Visit: Payer: Self-pay

## 2024-06-16 DIAGNOSIS — G8929 Other chronic pain: Secondary | ICD-10-CM

## 2024-06-16 DIAGNOSIS — M79601 Pain in right arm: Secondary | ICD-10-CM

## 2024-06-22 ENCOUNTER — Other Ambulatory Visit (HOSPITAL_COMMUNITY): Payer: Self-pay

## 2024-06-22 ENCOUNTER — Other Ambulatory Visit: Payer: Self-pay

## 2024-06-22 ENCOUNTER — Encounter: Payer: Self-pay | Admitting: Internal Medicine

## 2024-07-03 ENCOUNTER — Other Ambulatory Visit (HOSPITAL_COMMUNITY): Payer: Self-pay

## 2024-07-03 ENCOUNTER — Other Ambulatory Visit: Payer: Self-pay | Admitting: Family Medicine

## 2024-07-03 DIAGNOSIS — J4489 Other specified chronic obstructive pulmonary disease: Secondary | ICD-10-CM

## 2024-07-03 DIAGNOSIS — R0602 Shortness of breath: Secondary | ICD-10-CM

## 2024-07-03 MED ORDER — ALBUTEROL SULFATE HFA 108 (90 BASE) MCG/ACT IN AERS
2.0000 | INHALATION_SPRAY | Freq: Four times a day (QID) | RESPIRATORY_TRACT | 1 refills | Status: DC | PRN
  Filled 2024-08-02: qty 18, 25d supply, fill #0
  Filled 2024-09-07: qty 18, 25d supply, fill #1

## 2024-07-06 ENCOUNTER — Other Ambulatory Visit (HOSPITAL_COMMUNITY): Payer: Self-pay

## 2024-07-06 MED ORDER — AMOXICILLIN 500 MG PO CAPS
500.0000 mg | ORAL_CAPSULE | Freq: Three times a day (TID) | ORAL | 0 refills | Status: DC
  Filled 2024-07-06 – 2024-07-10 (×4): qty 22, 8d supply, fill #0

## 2024-07-10 ENCOUNTER — Other Ambulatory Visit: Payer: Self-pay

## 2024-07-10 ENCOUNTER — Other Ambulatory Visit (HOSPITAL_COMMUNITY): Payer: Self-pay

## 2024-07-18 ENCOUNTER — Other Ambulatory Visit (HOSPITAL_COMMUNITY): Payer: Self-pay

## 2024-07-18 ENCOUNTER — Other Ambulatory Visit: Payer: Self-pay

## 2024-07-18 ENCOUNTER — Other Ambulatory Visit: Payer: Self-pay | Admitting: Family Medicine

## 2024-07-18 DIAGNOSIS — G8929 Other chronic pain: Secondary | ICD-10-CM

## 2024-07-18 DIAGNOSIS — M79601 Pain in right arm: Secondary | ICD-10-CM

## 2024-07-18 MED ORDER — CELECOXIB 100 MG PO CAPS
100.0000 mg | ORAL_CAPSULE | Freq: Two times a day (BID) | ORAL | 5 refills | Status: AC
  Filled 2024-07-18: qty 60, 30d supply, fill #0
  Filled 2024-08-12: qty 60, 30d supply, fill #1
  Filled 2024-08-14: qty 32, 16d supply, fill #1
  Filled 2024-08-22: qty 32, 16d supply, fill #2
  Filled 2024-08-30: qty 60, 30d supply, fill #2
  Filled 2024-09-25 – 2024-09-26 (×2): qty 60, 30d supply, fill #3
  Filled 2024-10-24 – 2024-10-31 (×3): qty 60, 30d supply, fill #4
  Filled 2024-11-24: qty 60, 30d supply, fill #5

## 2024-07-19 ENCOUNTER — Other Ambulatory Visit: Payer: Self-pay

## 2024-07-25 ENCOUNTER — Other Ambulatory Visit: Payer: Self-pay

## 2024-07-25 ENCOUNTER — Encounter: Payer: Self-pay | Admitting: Internal Medicine

## 2024-07-26 ENCOUNTER — Other Ambulatory Visit: Payer: Self-pay

## 2024-08-02 ENCOUNTER — Other Ambulatory Visit: Payer: Self-pay

## 2024-08-02 ENCOUNTER — Other Ambulatory Visit (HOSPITAL_COMMUNITY): Payer: Self-pay

## 2024-08-08 ENCOUNTER — Ambulatory Visit (INDEPENDENT_AMBULATORY_CARE_PROVIDER_SITE_OTHER)

## 2024-08-08 DIAGNOSIS — Z23 Encounter for immunization: Secondary | ICD-10-CM | POA: Diagnosis not present

## 2024-08-14 ENCOUNTER — Other Ambulatory Visit: Payer: Self-pay

## 2024-08-14 ENCOUNTER — Other Ambulatory Visit (INDEPENDENT_AMBULATORY_CARE_PROVIDER_SITE_OTHER): Payer: Self-pay | Admitting: Vascular Surgery

## 2024-08-14 DIAGNOSIS — G458 Other transient cerebral ischemic attacks and related syndromes: Secondary | ICD-10-CM

## 2024-08-14 DIAGNOSIS — I6523 Occlusion and stenosis of bilateral carotid arteries: Secondary | ICD-10-CM

## 2024-08-15 ENCOUNTER — Other Ambulatory Visit (INDEPENDENT_AMBULATORY_CARE_PROVIDER_SITE_OTHER): Payer: Medicare PPO

## 2024-08-15 ENCOUNTER — Ambulatory Visit (INDEPENDENT_AMBULATORY_CARE_PROVIDER_SITE_OTHER): Payer: Medicare PPO | Admitting: Vascular Surgery

## 2024-08-15 ENCOUNTER — Other Ambulatory Visit: Payer: Self-pay

## 2024-08-15 ENCOUNTER — Encounter (INDEPENDENT_AMBULATORY_CARE_PROVIDER_SITE_OTHER): Payer: Self-pay | Admitting: Vascular Surgery

## 2024-08-15 VITALS — BP 150/71 | HR 85 | Resp 18 | Ht 61.0 in | Wt 131.0 lb

## 2024-08-15 DIAGNOSIS — G458 Other transient cerebral ischemic attacks and related syndromes: Secondary | ICD-10-CM

## 2024-08-15 DIAGNOSIS — I10 Essential (primary) hypertension: Secondary | ICD-10-CM

## 2024-08-15 DIAGNOSIS — E782 Mixed hyperlipidemia: Secondary | ICD-10-CM | POA: Diagnosis not present

## 2024-08-15 DIAGNOSIS — I6523 Occlusion and stenosis of bilateral carotid arteries: Secondary | ICD-10-CM

## 2024-08-15 NOTE — Assessment & Plan Note (Signed)
 Her duplex studies today show essentially no change from her previous study.  Her left vertebral artery is retrograde.  Both carotid arteries have stable 40 to 59% stenosis bilaterally with velocities on the lower end of that range.  No worrisome symptoms at this time.  Continue aspirin  and statin agent.  Recheck in 1 year.

## 2024-08-15 NOTE — Progress Notes (Signed)
 MRN : [id number]  Lori Brady is a 78 y.o. (1946-01-11) female who presents with chief complaint of  Chief Complaint  Patient presents with   Follow-up    1 year follow up w/ carotid   .  History of Present Illness: Patient returns today in follow up of carotid artery disease and left subclavian steal syndrome.  She is doing well.  She does not have any current focal neurologic symptoms of cerebrovascular ischemia. Specifically, the patient denies amaurosis fugax, speech or swallowing difficulties, or arm or leg weakness or numbness.  She does have occasional dizziness but nothing that is disabling or seems to be progressing.  Her duplex studies today show essentially no change from her previous study.  Her left vertebral artery is retrograde.  Both carotid arteries have stable 40 to 59% stenosis bilaterally with velocities on the lower end of that range.  Current Outpatient Medications  Medication Sig Dispense Refill   albuterol  (VENTOLIN  HFA) 108 (90 Base) MCG/ACT inhaler Inhale 2 puffs into the lungs every 6 (six) hours as needed for wheezing or shortness of breath. 18 g 1   alendronate  (FOSAMAX ) 70 MG tablet Take 1 tablet (70 mg total) by mouth once a week -take on an empty stomach before breakfast with 8 oz of water. Remaine upright for 30 minutes. 12 tablet 1   amoxicillin  (AMOXIL ) 500 MG capsule Take 2 capsules by mouth now then take  1 capsule 3 times daily. 22 capsule 0   aspirin  81 MG EC tablet Take 81 mg by mouth daily with breakfast.     atorvastatin  (LIPITOR) 40 MG tablet Take 1 tablet (40 mg total) by mouth daily. 60 tablet 0   azelastine  (ASTELIN ) 0.1 % nasal spray Place 2 sprays into both nostrils daily at 6 (six) AM. 30 mL 11   Budeson-Glycopyrrol-Formoterol  (BREZTRI  AEROSPHERE) 160-9-4.8 MCG/ACT AERO Inhale 2 puffs into the lungs in the morning and at bedtime. Getting from [employer]     carbidopa -levodopa  (SINEMET  CR) 50-200 MG tablet Take 1 tablet by mouth at bedtime.      carbidopa -levodopa  (SINEMET  CR) 50-200 MG tablet Take 1 tablet by mouth at bedtime 90 tablet 3   carbidopa -levodopa  (SINEMET  IR) 25-100 MG tablet Take 3 tablets with breakfast, 2 tablets with lunch, 2 tablets with supper.     carbidopa -levodopa  (SINEMET  IR) 25-100 MG tablet Take 3 tablets every morning AND 2 tablets 2 (two) times daily. 210 tablet 11   celecoxib  (CELEBREX ) 100 MG capsule Take 1 capsule (100 mg total) by mouth 2 (two) times daily. 60 capsule 5   cetirizine  (ZYRTEC ) 10 MG tablet Take 1 tablet (10 mg total) by mouth daily. 30 tablet 1   Cholecalciferol  (VITAMIN D ) 2000 UNITS CAPS Take 1 capsule by mouth daily.     citalopram  (CELEXA ) 20 MG tablet Take 1 tablet (20 mg total) by mouth daily. 60 tablet 0   ferrous sulfate 325 (65 FE) MG EC tablet Take 650 mg by mouth daily with breakfast.     ipratropium-albuterol  (DUONEB) 0.5-2.5 (3) MG/3ML SOLN Take 3 mLs by nebulization every 4 (four) hours as needed. 360 mL 3   melatonin 5 MG TABS Take 1 tablet by mouth at bedtime.     montelukast  (SINGULAIR ) 10 MG tablet Take 1 tablet (10 mg total) by mouth every morning. 60 tablet 0   nystatin  ointment (MYCOSTATIN ) apply to the affected area 2 to 3 times daily 15 g 1   omeprazole  (PRILOSEC) 40 MG capsule Take  1 capsule (40 mg total) by mouth in the morning and at bedtime. 180 capsule 3   Probiotic Product (PROBIOTIC DAILY PO) Take 1 tablet by mouth daily.     sodium fluoride  (PREVIDENT 5000 DRY MOUTH) 1.1 % GEL dental gel Use as directed 100 mL 5   Sodium Sulfate-Mag Sulfate-KCl (SUTAB ) [phone] MG TABS At 5 PM take 12 tablets using the 8 oz cup provided in the kit drinking 5 cups of water and 5 hours before your procedure repeat the same process. 24 tablet 0   No current facility-administered medications for this visit.    Past Medical History:  Diagnosis Date   Arthritis    Asthma    Crohn's disease (HCC)    Emphysema of lung (HCC)    GERD (gastroesophageal reflux disease)    Hip  fracture (HCC) 01/26/2017   History of chicken pox    History of heart attack    Hx of completed stroke    Hypercholesteremia    Hypertension    Parkinson disease (HCC)    Pneumonia    Seasonal allergies    Sepsis (HCC)    Septic shock (HCC) 10/08/2016   UTI (lower urinary tract infection)     Past Surgical History:  Procedure Laterality Date   BIOPSY  07/20/2023   Procedure: BIOPSY;  Surgeon: Therisa Bi, MD;  Location: [facility] ENDOSCOPY;  Service: Gastroenterology;;   BREAST BIOPSY Bilateral 1960's to 90's   breast cysts     CATARACT EXTRACTION, BILATERAL     COLONOSCOPY WITH PROPOFOL  N/A 06/01/2022   Procedure: COLONOSCOPY WITH PROPOFOL ;  Surgeon: Therisa Bi, MD;  Location: [facility] ENDOSCOPY;  Service: Gastroenterology;  Laterality: N/A;   COLONOSCOPY WITH PROPOFOL  N/A 04/19/2023   Procedure: COLONOSCOPY WITH PROPOFOL ;  Surgeon: Therisa Bi, MD;  Location: [facility] ENDOSCOPY;  Service: Gastroenterology;  Laterality: N/A;   COLONOSCOPY WITH PROPOFOL  N/A 07/20/2023   Procedure: COLONOSCOPY WITH PROPOFOL ;  Surgeon: Therisa Bi, MD;  Location: [facility] ENDOSCOPY;  Service: Gastroenterology;  Laterality: N/A;   ELBOW SURGERY     right   ESOPHAGOGASTRODUODENOSCOPY (EGD) WITH PROPOFOL  N/A 04/19/2023   Procedure: ESOPHAGOGASTRODUODENOSCOPY (EGD) WITH PROPOFOL ;  Surgeon: Therisa Bi, MD;  Location: [facility] ENDOSCOPY;  Service: Gastroenterology;  Laterality: N/A;   ESOPHAGOGASTRODUODENOSCOPY (EGD) WITH PROPOFOL  N/A 07/20/2023   Procedure: ESOPHAGOGASTRODUODENOSCOPY (EGD) WITH PROPOFOL ;  Surgeon: Therisa Bi, MD;  Location: [facility] ENDOSCOPY;  Service: Gastroenterology;  Laterality: N/A;   GIVENS CAPSULE STUDY N/A 08/03/2023   Procedure: GIVENS CAPSULE STUDY;  Surgeon: Therisa Bi, MD;  Location: [facility] ENDOSCOPY;  Service: Gastroenterology;  Laterality: N/A;   HOT HEMOSTASIS  07/20/2023   Procedure: HOT HEMOSTASIS (ARGON PLASMA COAGULATION/BICAP);  Surgeon: Therisa Bi, MD;  Location: [facility] ENDOSCOPY;  Service:  Gastroenterology;;   POLYPECTOMY  07/20/2023   Procedure: POLYPECTOMY;  Surgeon: Therisa Bi, MD;  Location: [facility] ENDOSCOPY;  Service: Gastroenterology;;   right wrist fracture     TUBAL LIGATION     WRIST SURGERY Left      Social History   Tobacco Use   Smoking status: Former    Current packs/day: 0.00    Average packs/day: 1 pack/day for 30.0 years (30.0 ttl pk-yrs)    Types: Cigarettes    Start date: 08/01/1978    Quit date: 08/01/2008    Years since quitting: 16.0   Smokeless tobacco: Never  Vaping Use   Vaping status: Never Used  Substance Use Topics   Alcohol use: Not Currently    Alcohol/week: 1.0 standard drink of alcohol  Types: 1 Shots of liquor per week   Drug use: No       Family History  Problem Relation Age of Onset   Cancer Sister        ovarian/uterine/breast   Breast cancer Sister 20   Cancer Sister        breast   Heart disease Sister    Breast cancer Sister 68   Heart disease Father    Cancer Father        liver   Pancreatic cancer Mother    Heart disease Brother    Heart attack Brother    Cancer Brother        liver   Heart disease Sister    Diabetes Maternal Grandmother      Allergies  Allergen Reactions   Levofloxacin  Nausea And Vomiting, Nausea Only and Other (See Comments)    Can take if given something for n/v   Vicodin [Hydrocodone -Acetaminophen ] Nausea Only     REVIEW OF SYSTEMS (Negative unless checked)   Constitutional: [] Weight loss  [] Fever  [] Chills Cardiac: [] Chest pain   [] Chest pressure   [] Palpitations   [] Shortness of breath when laying flat   [] Shortness of breath at rest   [] Shortness of breath with exertion. Vascular:  [] Pain in legs with walking   [] Pain in legs at rest   [] Pain in legs when laying flat   [] Claudication   [] Pain in feet when walking  [] Pain in feet at rest  [] Pain in feet when laying flat   [] History of DVT   [] Phlebitis   [] Swelling in legs   [] Varicose veins   [] Non-healing ulcers Pulmonary:    [] Uses home oxygen   [] Productive cough   [] Hemoptysis   [] Wheeze  [x] COPD   [] Asthma Neurologic:  [x] Dizziness  [] Blackouts   [] Seizures   [] History of stroke   [] History of TIA  [] Aphasia   [] Temporary blindness   [] Dysphagia   [] Weakness or numbness in arms   [] Weakness or numbness in legs Musculoskeletal:  [x] Arthritis   [] Joint swelling   [x] Joint pain   [] Low back pain Hematologic:  [] Easy bruising  [] Easy bleeding   [] Hypercoagulable state   [x] Anemic  [] Hepatitis Gastrointestinal:  [] Blood in stool   [] Vomiting blood  [x] Gastroesophageal reflux/heartburn   [] Difficulty swallowing. Genitourinary:  [] Chronic kidney disease   [] Difficult urination  [] Frequent urination  [] Burning with urination   [] Blood in urine Skin:  [] Rashes   [] Ulcers   [] Wounds Psychological:  [] History of anxiety   []  History of major depression.  Physical Examination  BP (!) 150/71 (BP Location: Right Arm, Patient Position: Sitting, Cuff Size: Normal)   Pulse 85   Resp 18   Ht 5' 1 (1.549 m)   Wt 131 lb (59.4 kg)   BMI 24.75 kg/m  Gen:  WD/WN, NAD Head: [place]/AT, No temporalis wasting. Ear/Nose/Throat: Hearing grossly intact, nares w/o erythema or drainage Eyes: Conjunctiva clear. Sclera non-icteric Neck: Supple.  Trachea midline Pulmonary:  Good air movement, no use of accessory muscles.  Cardiac: RRR, no JVD Vascular:  Vessel Right Left  Radial Palpable Palpable           Musculoskeletal: M/S 5/5 throughout.  No deformity or atrophy. In a wheelchair. No edema. Neurologic: Sensation grossly intact in extremities.  Symmetrical.  Speech is fluent.  Psychiatric: Judgment intact, Mood & affect appropriate for pt's clinical situation. Dermatologic: No rashes or ulcers noted.  No cellulitis or open wounds.      Labs Recent Results (from  the past 2160 hours)  Pulmonary Function Test     Status: None   Collection Time: 06/06/24 10:55 AM  Result Value Ref Range   FEV1     FVC     FEV1/FVC     TLC      DLCO      Radiology No results found.  Assessment/Plan  Subclavian steal syndrome of left subclavian artery Her duplex studies today show essentially no change from her previous study.  Her left vertebral artery is retrograde.  Both carotid arteries have stable 40 to 59% stenosis bilaterally with velocities on the lower end of that range.  No worrisome symptoms at this time.  Continue aspirin  and statin agent.  Recheck in 1 year.  Carotid artery stenosis Her duplex studies today show essentially no change from her previous study.  Her left vertebral artery is retrograde.  Both carotid arteries have stable 40 to 59% stenosis bilaterally with velocities on the lower end of that range.  No worrisome symptoms at this time.  Continue aspirin  and statin agent.  Recheck in 1 year.  Benign essential HTN blood pressure control important in reducing the progression of atherosclerotic disease. On appropriate oral medications.     Mixed hyperlipidemia lipid control important in reducing the progression of atherosclerotic disease. Continue statin therapy  Selinda Gu, MD  08/15/2024 11:16 AM    This note was created with Dragon medical transcription system.  Any errors from dictation are purely unintentional

## 2024-08-16 ENCOUNTER — Encounter: Payer: Self-pay | Admitting: Internal Medicine

## 2024-08-16 ENCOUNTER — Other Ambulatory Visit: Payer: Self-pay

## 2024-08-16 ENCOUNTER — Other Ambulatory Visit (HOSPITAL_COMMUNITY): Payer: Self-pay

## 2024-08-16 ENCOUNTER — Other Ambulatory Visit: Payer: Self-pay | Admitting: Family Medicine

## 2024-08-16 ENCOUNTER — Other Ambulatory Visit: Payer: Self-pay | Admitting: Nurse Practitioner

## 2024-08-16 DIAGNOSIS — J45909 Unspecified asthma, uncomplicated: Secondary | ICD-10-CM

## 2024-08-16 DIAGNOSIS — E7800 Pure hypercholesterolemia, unspecified: Secondary | ICD-10-CM

## 2024-08-16 DIAGNOSIS — F419 Anxiety disorder, unspecified: Secondary | ICD-10-CM

## 2024-08-16 DIAGNOSIS — J301 Allergic rhinitis due to pollen: Secondary | ICD-10-CM

## 2024-08-16 MED ORDER — CETIRIZINE HCL 10 MG PO TABS
10.0000 mg | ORAL_TABLET | Freq: Every day | ORAL | 1 refills | Status: DC
  Filled 2024-08-16 – 2024-08-30 (×3): qty 30, 30d supply, fill #0
  Filled 2024-09-25 – 2024-09-26 (×2): qty 30, 30d supply, fill #1

## 2024-08-17 ENCOUNTER — Other Ambulatory Visit: Payer: Self-pay

## 2024-08-18 ENCOUNTER — Other Ambulatory Visit: Payer: Self-pay

## 2024-08-18 ENCOUNTER — Other Ambulatory Visit (HOSPITAL_COMMUNITY): Payer: Self-pay

## 2024-08-18 MED ORDER — MONTELUKAST SODIUM 10 MG PO TABS
10.0000 mg | ORAL_TABLET | Freq: Every morning | ORAL | 0 refills | Status: AC
  Filled 2024-08-18: qty 90, 90d supply, fill #0
  Filled 2024-08-30: qty 30, 30d supply, fill #0
  Filled 2024-09-25 – 2024-09-26 (×2): qty 30, 30d supply, fill #1
  Filled 2024-10-24 – 2024-10-27 (×2): qty 30, 30d supply, fill #2

## 2024-08-18 NOTE — Telephone Encounter (Signed)
 Requested Prescriptions  Pending Prescriptions Disp Refills   atorvastatin  (LIPITOR) 40 MG tablet 60 tablet 0    Sig: Take 1 tablet (40 mg total) by mouth daily.     Cardiovascular:  Antilipid - Statins Failed - 08/18/2024  9:40 AM      Failed - Lipid Panel in normal range within the last 12 months    Cholesterol, Total  Date Value Ref Range Status  06/14/2023 137 100 - 199 mg/dL Final   Cholesterol  Date Value Ref Range Status  12/07/2011 133 0 - 200 mg/dL Final   Ldl Cholesterol, Calc  Date Value Ref Range Status  12/07/2011 63 0 - 100 mg/dL Final   LDL Chol Calc (NIH)  Date Value Ref Range Status  06/14/2023 70 0 - 99 mg/dL Final   HDL Cholesterol  Date Value Ref Range Status  12/07/2011 56 40 - 60 mg/dL Final   HDL  Date Value Ref Range Status  06/14/2023 39 (L) >39 mg/dL Final   Triglycerides  Date Value Ref Range Status  06/14/2023 166 (H) 0 - 149 mg/dL Final  98/78/7986 71 0 - 200 mg/dL Final         Passed - Patient is not pregnant      Passed - Valid encounter within last 12 months    Recent Outpatient Visits           4 months ago Chronic right shoulder pain   [facility] Gareth Mliss [place], FNP               citalopram  (CELEXA ) 20 MG tablet 60 tablet 0    Sig: Take 1 tablet (20 mg total) by mouth daily.     Psychiatry:  Antidepressants - SSRI Failed - 08/18/2024  9:40 AM      Failed - Valid encounter within last 6 months    Recent Outpatient Visits           4 months ago Chronic right shoulder pain   [facility] Gareth Mliss F, FNP               montelukast  (SINGULAIR ) 10 MG tablet 60 tablet 0    Sig: Take 1 tablet (10 mg total) by mouth every morning.     Pulmonology:  Leukotriene Inhibitors Passed - 08/18/2024  9:40 AM      Passed - Valid encounter within last 12 months    Recent Outpatient Visits           4 months ago Chronic right shoulder pain   [facility] Gareth Mliss F[place], [place]

## 2024-08-18 NOTE — Telephone Encounter (Signed)
 Requested medications are due for refill today.  yes  Requested medications are on the active medications list.  yes  Last refill. 06/05/2024 2 month supply  Future visit scheduled.   no  Notes to clinic.  Labs are expired for statin. Pt last seen for chronic conditions 12/17/2023.    Requested Prescriptions  Pending Prescriptions Disp Refills   atorvastatin  (LIPITOR) 40 MG tablet 60 tablet 0    Sig: Take 1 tablet (40 mg total) by mouth daily.     Cardiovascular:  Antilipid - Statins Failed - 08/18/2024  9:41 AM      Failed - Lipid Panel in normal range within the last 12 months    Cholesterol, Total  Date Value Ref Range Status  06/14/2023 137 100 - 199 mg/dL Final   Cholesterol  Date Value Ref Range Status  12/07/2011 133 0 - 200 mg/dL Final   Ldl Cholesterol, Calc  Date Value Ref Range Status  12/07/2011 63 0 - 100 mg/dL Final   LDL Chol Calc (NIH)  Date Value Ref Range Status  06/14/2023 70 0 - 99 mg/dL Final   HDL Cholesterol  Date Value Ref Range Status  12/07/2011 56 40 - 60 mg/dL Final   HDL  Date Value Ref Range Status  06/14/2023 39 (L) >39 mg/dL Final   Triglycerides  Date Value Ref Range Status  06/14/2023 166 (H) 0 - 149 mg/dL Final  98/78/7986 71 0 - 200 mg/dL Final         Passed - Patient is not pregnant      Passed - Valid encounter within last 12 months    Recent Outpatient Visits           4 months ago Chronic right shoulder pain   [facility] Gareth Mliss FAL[place], FNP               citalopram  (CELEXA ) 20 MG tablet 60 tablet 0    Sig: Take 1 tablet (20 mg total) by mouth daily.     Psychiatry:  Antidepressants - SSRI Failed - 08/18/2024  9:41 AM      Failed - Valid encounter within last 6 months    Recent Outpatient Visits           4 months ago Chronic right shoulder pain   [facility] Gareth Mliss F, [place]              Signed Prescriptions Disp Refills   montelukast   (SINGULAIR ) 10 MG tablet 90 tablet 0    Sig: Take 1 tablet (10 mg total) by mouth every morning.     Pulmonology:  Leukotriene Inhibitors Passed - 08/18/2024  9:41 AM      Passed - Valid encounter within last 12 months    Recent Outpatient Visits           4 months ago Chronic right shoulder pain   [facility] Gareth Mliss [place], [place]

## 2024-08-21 ENCOUNTER — Other Ambulatory Visit: Payer: Self-pay

## 2024-08-21 MED ORDER — CITALOPRAM HYDROBROMIDE 20 MG PO TABS
20.0000 mg | ORAL_TABLET | Freq: Every day | ORAL | 0 refills | Status: DC
  Filled 2024-08-21 – 2024-08-30 (×2): qty 30, 30d supply, fill #0
  Filled 2024-09-25 – 2024-09-26 (×2): qty 30, 30d supply, fill #1

## 2024-08-21 MED ORDER — ATORVASTATIN CALCIUM 40 MG PO TABS
40.0000 mg | ORAL_TABLET | Freq: Every day | ORAL | 0 refills | Status: DC
  Filled 2024-08-21 – 2024-08-30 (×2): qty 30, 30d supply, fill #0
  Filled 2024-09-25 – 2024-09-26 (×2): qty 30, 30d supply, fill #1

## 2024-08-22 ENCOUNTER — Other Ambulatory Visit (HOSPITAL_COMMUNITY): Payer: Self-pay

## 2024-08-22 ENCOUNTER — Encounter: Payer: Self-pay | Admitting: Internal Medicine

## 2024-08-22 ENCOUNTER — Other Ambulatory Visit: Payer: Self-pay

## 2024-08-22 ENCOUNTER — Telehealth: Payer: Self-pay

## 2024-08-22 NOTE — Telephone Encounter (Signed)
 Gave pt a call pt is coming up due for AZ[employer]ME reenrollment (Brez[employer] ) left a HIPAA VM, will mail out PAP and faxed provider portion today.

## 2024-08-23 ENCOUNTER — Other Ambulatory Visit: Payer: Self-pay

## 2024-08-25 NOTE — Telephone Encounter (Signed)
 Received provider portion AZ[employer]ME [employer]  back from provider office.

## 2024-08-30 ENCOUNTER — Encounter: Payer: Self-pay | Admitting: Internal Medicine

## 2024-08-30 ENCOUNTER — Other Ambulatory Visit: Payer: Self-pay

## 2024-09-04 ENCOUNTER — Other Ambulatory Visit: Payer: Self-pay

## 2024-09-07 ENCOUNTER — Other Ambulatory Visit: Payer: Self-pay

## 2024-09-07 ENCOUNTER — Other Ambulatory Visit (HOSPITAL_COMMUNITY): Payer: Self-pay

## 2024-09-11 NOTE — Telephone Encounter (Signed)
 Received pt portion on AZ&ME [employer] , faxed to AZ[employer]ME along provider portion.

## 2024-09-13 ENCOUNTER — Other Ambulatory Visit (HOSPITAL_COMMUNITY): Payer: Self-pay

## 2024-09-13 DIAGNOSIS — Z1331 Encounter for screening for depression: Secondary | ICD-10-CM | POA: Diagnosis not present

## 2024-09-13 DIAGNOSIS — G4752 REM sleep behavior disorder: Secondary | ICD-10-CM | POA: Diagnosis not present

## 2024-09-13 DIAGNOSIS — R2689 Other abnormalities of gait and mobility: Secondary | ICD-10-CM | POA: Diagnosis not present

## 2024-09-13 DIAGNOSIS — G20C Parkinsonism, unspecified: Secondary | ICD-10-CM | POA: Diagnosis not present

## 2024-09-13 MED ORDER — CARBIDOPA-LEVODOPA ER 50-200 MG PO TBCR
2.0000 | EXTENDED_RELEASE_TABLET | Freq: Every day | ORAL | 11 refills | Status: AC
  Filled 2024-09-14 – 2024-09-16 (×3): qty 60, 30d supply, fill #0

## 2024-09-13 NOTE — Telephone Encounter (Signed)
 Pt has been approved for AZ[employer]ME Breztri  approval letter index

## 2024-09-14 ENCOUNTER — Other Ambulatory Visit: Payer: Self-pay

## 2024-09-14 ENCOUNTER — Other Ambulatory Visit (HOSPITAL_COMMUNITY): Payer: Self-pay

## 2024-09-15 ENCOUNTER — Other Ambulatory Visit (HOSPITAL_COMMUNITY): Payer: Self-pay

## 2024-09-16 ENCOUNTER — Other Ambulatory Visit (HOSPITAL_COMMUNITY): Payer: Self-pay

## 2024-09-18 ENCOUNTER — Other Ambulatory Visit: Payer: Self-pay

## 2024-09-18 DIAGNOSIS — E785 Hyperlipidemia, unspecified: Secondary | ICD-10-CM | POA: Diagnosis not present

## 2024-09-18 DIAGNOSIS — I69328 Other speech and language deficits following cerebral infarction: Secondary | ICD-10-CM | POA: Diagnosis not present

## 2024-09-18 DIAGNOSIS — I69351 Hemiplegia and hemiparesis following cerebral infarction affecting right dominant side: Secondary | ICD-10-CM | POA: Diagnosis not present

## 2024-09-18 DIAGNOSIS — G4752 REM sleep behavior disorder: Secondary | ICD-10-CM | POA: Diagnosis not present

## 2024-09-18 DIAGNOSIS — I2510 Atherosclerotic heart disease of native coronary artery without angina pectoris: Secondary | ICD-10-CM | POA: Diagnosis not present

## 2024-09-18 DIAGNOSIS — J4489 Other specified chronic obstructive pulmonary disease: Secondary | ICD-10-CM | POA: Diagnosis not present

## 2024-09-18 DIAGNOSIS — G20A1 Parkinson's disease without dyskinesia, without mention of fluctuations: Secondary | ICD-10-CM | POA: Diagnosis not present

## 2024-09-18 DIAGNOSIS — M810 Age-related osteoporosis without current pathological fracture: Secondary | ICD-10-CM | POA: Diagnosis not present

## 2024-09-18 DIAGNOSIS — I10 Essential (primary) hypertension: Secondary | ICD-10-CM | POA: Diagnosis not present

## 2024-09-20 ENCOUNTER — Inpatient Hospital Stay: Admitting: Internal Medicine

## 2024-09-20 ENCOUNTER — Encounter: Payer: Self-pay | Admitting: Internal Medicine

## 2024-09-20 ENCOUNTER — Inpatient Hospital Stay

## 2024-09-20 ENCOUNTER — Inpatient Hospital Stay: Attending: Internal Medicine

## 2024-09-20 VITALS — BP 129/84 | HR 77 | Temp 97.9°F | Resp 18 | Ht 61.0 in | Wt 131.8 lb

## 2024-09-20 DIAGNOSIS — Z7983 Long term (current) use of bisphosphonates: Secondary | ICD-10-CM | POA: Insufficient documentation

## 2024-09-20 DIAGNOSIS — Z79899 Other long term (current) drug therapy: Secondary | ICD-10-CM | POA: Insufficient documentation

## 2024-09-20 DIAGNOSIS — D649 Anemia, unspecified: Secondary | ICD-10-CM | POA: Diagnosis not present

## 2024-09-20 DIAGNOSIS — Z800 Family history of malignant neoplasm of digestive organs: Secondary | ICD-10-CM | POA: Insufficient documentation

## 2024-09-20 DIAGNOSIS — G20A1 Parkinson's disease without dyskinesia, without mention of fluctuations: Secondary | ICD-10-CM | POA: Insufficient documentation

## 2024-09-20 DIAGNOSIS — R040 Epistaxis: Secondary | ICD-10-CM | POA: Insufficient documentation

## 2024-09-20 DIAGNOSIS — Z803 Family history of malignant neoplasm of breast: Secondary | ICD-10-CM | POA: Insufficient documentation

## 2024-09-20 LAB — CBC WITH DIFFERENTIAL (CANCER CENTER ONLY)
Abs Immature Granulocytes: 0.09 K/uL — ABNORMAL HIGH (ref 0.00–0.07)
Basophils Absolute: 0 K/uL (ref 0.0–0.1)
Basophils Relative: 0 %
Eosinophils Absolute: 0.2 K/uL (ref 0.0–0.5)
Eosinophils Relative: 3 %
HCT: 35.7 % — ABNORMAL LOW (ref 36.0–46.0)
Hemoglobin: 11.6 g/dL — ABNORMAL LOW (ref 12.0–15.0)
Immature Granulocytes: 1 %
Lymphocytes Relative: 22 %
Lymphs Abs: 2 K/uL (ref 0.7–4.0)
MCH: 30.1 pg (ref 26.0–34.0)
MCHC: 32.5 g/dL (ref 30.0–36.0)
MCV: 92.5 fL (ref 80.0–100.0)
Monocytes Absolute: 0.6 K/uL (ref 0.1–1.0)
Monocytes Relative: 7 %
Neutro Abs: 5.9 K/uL (ref 1.7–7.7)
Neutrophils Relative %: 67 %
Platelet Count: 196 K/uL (ref 150–400)
RBC: 3.86 MIL/uL — ABNORMAL LOW (ref 3.87–5.11)
RDW: 13.2 % (ref 11.5–15.5)
WBC Count: 8.9 K/uL (ref 4.0–10.5)
nRBC: 0 % (ref 0.0–0.2)

## 2024-09-20 LAB — BASIC METABOLIC PANEL - CANCER CENTER ONLY
Anion gap: 12 (ref 5–15)
BUN: 14 mg/dL (ref 8–23)
CO2: 26 mmol/L (ref 22–32)
Calcium: 9 mg/dL (ref 8.9–10.3)
Chloride: 98 mmol/L (ref 98–111)
Creatinine: 0.65 mg/dL (ref 0.44–1.00)
GFR, Estimated: >60 mL/min (ref >60)
Glucose, Bld: 110 mg/dL — ABNORMAL HIGH (ref 70–99)
Potassium: 4 mmol/L (ref 3.5–5.1)
Sodium: 136 mmol/L (ref 135–145)

## 2024-09-20 LAB — IRON AND TIBC
Iron: 95 ug/dL (ref 28–170)
Saturation Ratios: 24 % (ref 10.4–31.8)
TIBC: 389 ug/dL (ref 250–450)
UIBC: 294 ug/dL

## 2024-09-20 LAB — FERRITIN: Ferritin: 50 ng/mL (ref 11–307)

## 2024-09-20 NOTE — Progress Notes (Signed)
 No concerns today

## 2024-09-20 NOTE — Assessment & Plan Note (Addendum)
#  Iron  deficient anemia:[iron  sat- 4%; LOW ferritin; Hb 8.3- DEC 2023-PCP.  Currently on  PO iron  BID. No constipation.  # Today  Hb 11- stable/ improved- continue PO iron  BID. HOLD venofer .   #Etiology of iron  deficiency: Unclear; prior GI evaluation-/ Dr.Anna- colonoscopy [[id number] WNL]; Dr.Anna on June 3rd, 2024-EGD/- hital herniacapsule study- NEG. UA neg for blood in dec 2023.   # History of Parkinson disease/COPD-[stroke 2007-vocal cord paralysis]stable.  #Since patient is clinically stable I think is reasonable for the patient to follow-up with PCP/can follow-up with us  as needed.  Patient comfortable with the plan; to call us  if any questions or concerns in the interim.   # DISPOSITION: # NO venofer  today # follow up as needed- Dr.B

## 2024-09-20 NOTE — Progress Notes (Signed)
 [facility] CONSULT NOTE  Patient Care Team: Myrla, Jon HERO, MD as PCP - General (Family Medicine) Cathlyn Seal, MD as Consulting Physician (Dermatology) Fernand Elfreda LABOR, MD as Consulting Physician (Internal Medicine) Charmayne Molly, MD as Consulting Physician (Ophthalmology) Maree Jannett POUR, MD as Consulting Physician (Neurology) Delles, Sharyle LABOR, RPH-CPP as Pharmacist Rennie Cindy SAUNDERS, MD as Consulting Physician (Oncology) Therisa Bi, MD as Consulting Physician (Gastroenterology) Paich, Kaitlin, PA-C (Neurology) Marea Selinda RAMAN, MD as Referring Physician (Vascular Surgery)  CHIEF COMPLAINTS/PURPOSE OF CONSULTATION: ANEMIA   HEMATOLOGY HISTORY  # ANEMIA[Hb; MCV-platelets- WBC; Iron  sat; ferritin;  GFR- CT/US ; EGD- none; /colonoscopy-2022- Dr.Anna [GI]   Latest Reference Range & Units 10/26/22 13:39  Iron  27 - 139 ug/dL 15 (L)  UIBC 881 - 630 ug/dL 599 (H)  TIBC 749 - 549 ug/dL 584  Ferritin 15 - 849 ng/mL 14 (L)  Iron  Saturation 15 - 55 % 4 (LL)  (LL): Data is critically low (L): Data is abnormally low (H): Data is abnormally high  HISTORY OF PRESENTING ILLNESS: in a wheel chair; with her sister in law.    Discussed the use of AI scribe software for clinical note transcription with the patient, who gave verbal consent to proceed.  History of Present Illness   Lori Brady is a 78 year old female with anemia who presents for follow-up.  She has not experienced any new symptoms since her last visit six months ago and continues to take her iron  pills regularly. Her hemoglobin levels have been 11 at present, with previous levels of 8 and 10. She has undergone a colonoscopy and an upper endoscopy, both of which did not reveal any sources of blood loss. Additionally, a camera study was performed and returned normal results.  She experiences epistaxis (nosebleeds).  She has a history of Parkinson's disease, which affects her voice. She is currently  undergoing physical therapy and reports no falls. She uses a scooter for mobility within her home.      Review of Systems  Constitutional:  Negative for chills, diaphoresis, fever, malaise/fatigue and weight loss.  HENT:  Negative for nosebleeds and sore throat.   Eyes:  Negative for double vision.  Respiratory:  Negative for cough, hemoptysis, sputum production, shortness of breath and wheezing.   Cardiovascular:  Negative for chest pain, palpitations, orthopnea and leg swelling.  Gastrointestinal:  Negative for abdominal pain, blood in stool, constipation, diarrhea, heartburn, melena, nausea and vomiting.  Genitourinary:  Negative for dysuria, frequency and urgency.  Musculoskeletal:  Negative for back pain and joint pain.  Skin: Negative.  Negative for itching and rash.  Neurological:  Negative for dizziness, tingling, focal weakness, weakness and headaches.  Endo/Heme/Allergies:  Does not bruise/bleed easily.  Psychiatric/Behavioral:  Negative for depression. The patient is not nervous/anxious and does not have insomnia.      MEDICAL HISTORY:  Past Medical History:  Diagnosis Date   Arthritis    Asthma    Crohn's disease (HCC)    Emphysema of lung (HCC)    GERD (gastroesophageal reflux disease)    Hip fracture (HCC) 01/26/2017   History of chicken pox    History of heart attack    Hx of completed stroke    Hypercholesteremia    Hypertension    Parkinson disease (HCC)    Pneumonia    Seasonal allergies    Sepsis (HCC)    Septic shock (HCC) 10/08/2016   UTI (lower urinary tract infection)     SURGICAL HISTORY:  Past Surgical History:  Procedure Laterality Date   BIOPSY  07/20/2023   Procedure: BIOPSY;  Surgeon: Therisa Bi, MD;  Location: [facility] ENDOSCOPY;  Service: Gastroenterology;;   BREAST BIOPSY Bilateral 1960's to 90's   breast cysts     CATARACT EXTRACTION, BILATERAL     COLONOSCOPY WITH PROPOFOL  N/A 06/01/2022   Procedure: COLONOSCOPY WITH PROPOFOL ;  Surgeon:  Therisa Bi, MD;  Location: [facility] ENDOSCOPY;  Service: Gastroenterology;  Laterality: N/A;   COLONOSCOPY WITH PROPOFOL  N/A 04/19/2023   Procedure: COLONOSCOPY WITH PROPOFOL ;  Surgeon: Therisa Bi, MD;  Location: [facility] ENDOSCOPY;  Service: Gastroenterology;  Laterality: N/A;   COLONOSCOPY WITH PROPOFOL  N/A 07/20/2023   Procedure: COLONOSCOPY WITH PROPOFOL ;  Surgeon: Therisa Bi, MD;  Location: [facility] ENDOSCOPY;  Service: Gastroenterology;  Laterality: N/A;   ELBOW SURGERY     right   ESOPHAGOGASTRODUODENOSCOPY (EGD) WITH PROPOFOL  N/A 04/19/2023   Procedure: ESOPHAGOGASTRODUODENOSCOPY (EGD) WITH PROPOFOL ;  Surgeon: Therisa Bi, MD;  Location: [facility] ENDOSCOPY;  Service: Gastroenterology;  Laterality: N/A;   ESOPHAGOGASTRODUODENOSCOPY (EGD) WITH PROPOFOL  N/A 07/20/2023   Procedure: ESOPHAGOGASTRODUODENOSCOPY (EGD) WITH PROPOFOL ;  Surgeon: Therisa Bi, MD;  Location: [facility] ENDOSCOPY;  Service: Gastroenterology;  Laterality: N/A;   GIVENS CAPSULE STUDY N/A 08/03/2023   Procedure: GIVENS CAPSULE STUDY;  Surgeon: Therisa Bi, MD;  Location: [facility] ENDOSCOPY;  Service: Gastroenterology;  Laterality: N/A;   HOT HEMOSTASIS  07/20/2023   Procedure: HOT HEMOSTASIS (ARGON PLASMA COAGULATION/BICAP);  Surgeon: Therisa Bi, MD;  Location: [facility] ENDOSCOPY;  Service: Gastroenterology;;   POLYPECTOMY  07/20/2023   Procedure: POLYPECTOMY;  Surgeon: Therisa Bi, MD;  Location: [facility] ENDOSCOPY;  Service: Gastroenterology;;   right wrist fracture     TUBAL LIGATION     WRIST SURGERY Left     SOCIAL HISTORY: Social History   Socioeconomic History   Marital status: Widowed    Spouse name: Not on file   Number of children: 3   Years of education: Not on file   Highest education level: Some college, no degree  Occupational History   Occupation: retired  Tobacco Use   Smoking status: Former    Current packs/day: 0.00    Average packs/day: 1 pack/day for 30.0 years (30.0 ttl pk-yrs)    Types: Cigarettes    Start date: 08/01/1978     Quit date: 08/01/2008    Years since quitting: 16.1   Smokeless tobacco: Never  Vaping Use   Vaping status: Never Used  Substance and Sexual Activity   Alcohol use: Not Currently    Alcohol/week: 1.0 standard drink of alcohol    Types: 1 Shots of liquor per week   Drug use: No   Sexual activity: Not on file  Other Topics Concern   Not on file  Social History Narrative   Not on file   Social Drivers of Health   Financial Resource Strain: Low Risk  (02/16/2024)   Overall Financial Resource Strain (CARDIA)    Difficulty of Paying Living Expenses: Not hard at all  Food Insecurity: No Food Insecurity (02/16/2024)   Hunger Vital Sign    Worried About Running Out of Food in the Last Year: Never true    Ran Out of Food in the Last Year: Never true  Transportation Needs: No Transportation Needs (02/16/2024)   PRAPARE - Administrator, Civil Service (Medical): No    Lack of Transportation (Non-Medical): No  Physical Activity: Insufficiently Active (02/16/2024)   Exercise Vital Sign    Days of Exercise per Week: 5 days  Minutes of Exercise per Session: 10 min  Stress: No Stress Concern Present (02/16/2024)   [employer] of Occupational Health - Occupational Stress Questionnaire    Feeling of Stress : Not at all  Social Connections: Socially Isolated (02/16/2024)   Social Connection and Isolation Panel    Frequency of Communication with Friends and Family: More than three times a week    Frequency of Social Gatherings with Friends and Family: More than three times a week    Attends Religious Services: Never    Database Administrator or Organizations: No    Attends Banker Meetings: Never    Marital Status: Widowed  Intimate Partner Violence: Not At Risk (02/16/2024)   Humiliation, Afraid, Rape, and Kick questionnaire    Fear of Current or Ex-Partner: No    Emotionally Abused: No    Physically Abused: No    Sexually Abused: No    FAMILY HISTORY: Family  History  Problem Relation Age of Onset   Cancer Sister        ovarian/uterine/breast   Breast cancer Sister 9   Cancer Sister        breast   Heart disease Sister    Breast cancer Sister 74   Heart disease Father    Cancer Father        liver   Pancreatic cancer Mother    Heart disease Brother    Heart attack Brother    Cancer Brother        liver   Heart disease Sister    Diabetes Maternal Grandmother     ALLERGIES:  is allergic to levofloxacin  and vicodin [hydrocodone -acetaminophen ].  MEDICATIONS:  Current Outpatient Medications  Medication Sig Dispense Refill   albuterol  (VENTOLIN  HFA) 108 (90 Base) MCG/ACT inhaler Inhale 2 puffs into the lungs every 6 (six) hours as needed for wheezing or shortness of breath. 18 g 1   alendronate  (FOSAMAX ) 70 MG tablet Take 1 tablet (70 mg total) by mouth once a week -take on an empty stomach before breakfast with 8 oz of water. Remaine upright for 30 minutes. 12 tablet 1   aspirin  81 MG EC tablet Take 81 mg by mouth daily with breakfast.     atorvastatin  (LIPITOR) 40 MG tablet Take 1 tablet (40 mg total) by mouth daily. 60 tablet 0   azelastine  (ASTELIN ) 0.1 % nasal spray Place 2 sprays into both nostrils daily at 6 (six) AM. 30 mL 11   Budeson-Glycopyrrol-Formoterol  (BREZTRI  AEROSPHERE) 160-9-4.8 MCG/ACT AERO Inhale 2 puffs into the lungs in the morning and at bedtime. Getting from [facility]     carbidopa -levodopa  (SINEMET  CR) 50-200 MG tablet Take 2 tablets by mouth at bedtime 60 tablet 11   carbidopa -levodopa  (SINEMET  IR) 25-100 MG tablet Take 3 tablets with breakfast, 2 tablets with lunch, 2 tablets with supper.     carbidopa -levodopa  (SINEMET  IR) 25-100 MG tablet Take 3 tablets by mouth every morning AND 2 tablets by mouth 2 (two) times daily. 210 tablet 11   celecoxib  (CELEBREX ) 100 MG capsule Take 1 capsule (100 mg total) by mouth 2 (two) times daily. 60 capsule 5   cetirizine  (ZYRTEC ) 10 MG tablet Take 1 tablet (10 mg total) by mouth  daily. 30 tablet 1   Cholecalciferol  (VITAMIN D ) 2000 UNITS CAPS Take 1 capsule by mouth daily.     citalopram  (CELEXA ) 20 MG tablet Take 1 tablet (20 mg total) by mouth daily. 60 tablet 0   ferrous sulfate 325 (65 FE) MG  EC tablet Take 650 mg by mouth daily with breakfast. (Patient taking differently: Take 650 mg by mouth daily. Taking 2 qam)     ipratropium-albuterol  (DUONEB) 0.5-2.5 (3) MG/3ML SOLN Take 3 mLs by nebulization every 4 (four) hours as needed. 360 mL 3   melatonin 5 MG TABS Take 1 tablet by mouth at bedtime.     montelukast  (SINGULAIR ) 10 MG tablet Take 1 tablet (10 mg total) by mouth every morning. 90 tablet 0   omeprazole  (PRILOSEC) 40 MG capsule Take 1 capsule (40 mg total) by mouth in the morning and at bedtime. 180 capsule 3   Probiotic Product (PROBIOTIC DAILY PO) Take 1 tablet by mouth daily.     sodium fluoride  (PREVIDENT 5000 DRY MOUTH) 1.1 % GEL dental gel Use as directed 100 mL 5   carbidopa -levodopa  (SINEMET  CR) 50-200 MG tablet Take 1 tablet by mouth at bedtime. (Patient not taking: Reported on 09/20/2024)     carbidopa -levodopa  (SINEMET  CR) 50-200 MG tablet Take 1 tablet by mouth at bedtime (Patient not taking: Reported on 09/20/2024) 90 tablet 3   No current facility-administered medications for this visit.     SABRA  PHYSICAL EXAMINATION:   Vitals:   09/20/24 1431  BP: 129/84  Pulse: 77  Resp: 18  Temp: 97.9 F (36.6 C)  SpO2: 98%   Filed Weights   09/20/24 1431  Weight: 131 lb 12.8 oz (59.8 kg)    Physical Exam Vitals and nursing note reviewed.  HENT:     Head: Normocephalic and atraumatic.     Mouth/Throat:     Pharynx: Oropharynx is clear.  Eyes:     Extraocular Movements: Extraocular movements intact.     Pupils: Pupils are equal, round, and reactive to light.  Cardiovascular:     Rate and Rhythm: Normal rate and regular rhythm.  Pulmonary:     Comments: Decreased breath sounds bilaterally.  Abdominal:     Palpations: Abdomen is soft.   Musculoskeletal:        General: Normal range of motion.     Cervical back: Normal range of motion.  Skin:    General: Skin is warm.  Neurological:     General: No focal deficit present.     Mental Status: She is alert and oriented to person, place, and time.  Psychiatric:        Behavior: Behavior normal.        Judgment: Judgment normal.      LABORATORY DATA:  I have reviewed the data as listed Lab Results  Component Value Date   WBC 8.9 09/20/2024   HGB 11.6 (L) 09/20/2024   HCT 35.7 (L) 09/20/2024   MCV 92.5 09/20/2024   PLT 196 09/20/2024   Recent Labs    03/20/24 1442 09/20/24 1451  NA 137 136  K 3.9 4.0  CL 102 98  CO2 26 26  GLUCOSE 129* 110*  BUN 18 14  CREATININE 0.81 0.65  CALCIUM  8.6* 9.0  GFRNONAA >60 >60     No results found.  ASSESSMENT & PLAN:   Symptomatic anemia #Iron  deficient anemia:[iron  sat- 4%; LOW ferritin; Hb 8.3- DEC 2023-PCP.  Currently on  PO iron  BID. No constipation.  # Today  Hb 11- stable/ improved- continue PO iron  BID. HOLD venofer .   #Etiology of iron  deficiency: Unclear; prior GI evaluation-/ Dr.Anna- colonoscopy [[id number]]; Dr.Anna on June 3rd, 2024-EGD/- hital herniacapsule study- NEG. UA neg for blood in dec 2023.   # History of Parkinson disease/COPD-[stroke  2007-vocal cord paralysis]stable.  #Since patient is clinically stable I think is reasonable for the patient to follow-up with PCP/can follow-up with us  as needed.  Patient comfortable with the plan; to call us  if any questions or concerns in the interim.   # DISPOSITION: # NO venofer  today # follow up as needed- Dr.B  All questions were answered. The patient knows to call the clinic with any problems, questions or concerns.    Cindy JONELLE Joe, MD 09/20/2024 3:25 PM

## 2024-09-21 DIAGNOSIS — J4489 Other specified chronic obstructive pulmonary disease: Secondary | ICD-10-CM | POA: Diagnosis not present

## 2024-09-21 DIAGNOSIS — I69328 Other speech and language deficits following cerebral infarction: Secondary | ICD-10-CM | POA: Diagnosis not present

## 2024-09-21 DIAGNOSIS — I69351 Hemiplegia and hemiparesis following cerebral infarction affecting right dominant side: Secondary | ICD-10-CM | POA: Diagnosis not present

## 2024-09-21 DIAGNOSIS — E785 Hyperlipidemia, unspecified: Secondary | ICD-10-CM | POA: Diagnosis not present

## 2024-09-21 DIAGNOSIS — M810 Age-related osteoporosis without current pathological fracture: Secondary | ICD-10-CM | POA: Diagnosis not present

## 2024-09-21 DIAGNOSIS — G20A1 Parkinson's disease without dyskinesia, without mention of fluctuations: Secondary | ICD-10-CM | POA: Diagnosis not present

## 2024-09-21 DIAGNOSIS — I10 Essential (primary) hypertension: Secondary | ICD-10-CM | POA: Diagnosis not present

## 2024-09-21 DIAGNOSIS — G4752 REM sleep behavior disorder: Secondary | ICD-10-CM | POA: Diagnosis not present

## 2024-09-21 DIAGNOSIS — I2510 Atherosclerotic heart disease of native coronary artery without angina pectoris: Secondary | ICD-10-CM | POA: Diagnosis not present

## 2024-09-25 ENCOUNTER — Other Ambulatory Visit: Payer: Self-pay

## 2024-09-25 ENCOUNTER — Other Ambulatory Visit: Payer: Self-pay | Admitting: Nurse Practitioner

## 2024-09-25 ENCOUNTER — Encounter: Payer: Self-pay | Admitting: Internal Medicine

## 2024-09-25 ENCOUNTER — Other Ambulatory Visit (HOSPITAL_COMMUNITY): Payer: Self-pay

## 2024-09-25 DIAGNOSIS — E785 Hyperlipidemia, unspecified: Secondary | ICD-10-CM | POA: Diagnosis not present

## 2024-09-25 DIAGNOSIS — K219 Gastro-esophageal reflux disease without esophagitis: Secondary | ICD-10-CM

## 2024-09-25 DIAGNOSIS — I10 Essential (primary) hypertension: Secondary | ICD-10-CM | POA: Diagnosis not present

## 2024-09-25 MED ORDER — OMEPRAZOLE 40 MG PO CPDR
40.0000 mg | DELAYED_RELEASE_CAPSULE | Freq: Two times a day (BID) | ORAL | 3 refills | Status: AC
  Filled 2024-09-25: qty 180, 90d supply, fill #0
  Filled 2024-09-26: qty 60, 30d supply, fill #0
  Filled 2024-10-24 – 2024-10-31 (×3): qty 60, 30d supply, fill #1
  Filled 2024-11-24: qty 60, 30d supply, fill #2

## 2024-09-26 ENCOUNTER — Other Ambulatory Visit (HOSPITAL_COMMUNITY): Payer: Self-pay

## 2024-09-26 ENCOUNTER — Other Ambulatory Visit: Payer: Self-pay

## 2024-09-26 ENCOUNTER — Other Ambulatory Visit: Payer: Self-pay | Admitting: Family Medicine

## 2024-09-26 ENCOUNTER — Encounter: Payer: Self-pay | Admitting: Internal Medicine

## 2024-09-26 ENCOUNTER — Telehealth: Payer: Self-pay | Admitting: Family Medicine

## 2024-09-26 DIAGNOSIS — R0602 Shortness of breath: Secondary | ICD-10-CM

## 2024-09-26 DIAGNOSIS — J4489 Other specified chronic obstructive pulmonary disease: Secondary | ICD-10-CM

## 2024-09-26 MED ORDER — ALBUTEROL SULFATE HFA 108 (90 BASE) MCG/ACT IN AERS
2.0000 | INHALATION_SPRAY | Freq: Four times a day (QID) | RESPIRATORY_TRACT | 1 refills | Status: AC | PRN
  Filled 2024-10-25: qty 6.7, 25d supply, fill #0
  Filled 2024-11-30: qty 20.1, 75d supply, fill #1

## 2024-09-26 NOTE — Telephone Encounter (Unsigned)
 Copied from CRM #[id number]. Topic: General - Other >> Sep 26, 2024 12:30 PM Donee H wrote: Reason for CRM: Patient called requesting to speak with [facility] directly. 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  Please follow back up with patient at [phone]

## 2024-09-27 ENCOUNTER — Other Ambulatory Visit: Payer: Self-pay | Admitting: Pharmacist

## 2024-09-27 NOTE — Telephone Encounter (Signed)
 Received a message from [facility] stating pt want to speak with Kena Limon, call pt 3x time pt did not answer and voice msg is full.0

## 2024-09-28 ENCOUNTER — Other Ambulatory Visit: Payer: Self-pay

## 2024-09-28 NOTE — Telephone Encounter (Signed)
 Yes mine # is [phone]. Thank you

## 2024-10-02 DIAGNOSIS — I69351 Hemiplegia and hemiparesis following cerebral infarction affecting right dominant side: Secondary | ICD-10-CM | POA: Diagnosis not present

## 2024-10-02 DIAGNOSIS — I69328 Other speech and language deficits following cerebral infarction: Secondary | ICD-10-CM | POA: Diagnosis not present

## 2024-10-02 DIAGNOSIS — J4489 Other specified chronic obstructive pulmonary disease: Secondary | ICD-10-CM | POA: Diagnosis not present

## 2024-10-03 ENCOUNTER — Other Ambulatory Visit: Payer: Self-pay

## 2024-10-09 DIAGNOSIS — I2510 Atherosclerotic heart disease of native coronary artery without angina pectoris: Secondary | ICD-10-CM | POA: Diagnosis not present

## 2024-10-09 DIAGNOSIS — M810 Age-related osteoporosis without current pathological fracture: Secondary | ICD-10-CM | POA: Diagnosis not present

## 2024-10-09 DIAGNOSIS — I69351 Hemiplegia and hemiparesis following cerebral infarction affecting right dominant side: Secondary | ICD-10-CM | POA: Diagnosis not present

## 2024-10-09 DIAGNOSIS — J4489 Other specified chronic obstructive pulmonary disease: Secondary | ICD-10-CM | POA: Diagnosis not present

## 2024-10-09 DIAGNOSIS — G20A1 Parkinson's disease without dyskinesia, without mention of fluctuations: Secondary | ICD-10-CM | POA: Diagnosis not present

## 2024-10-09 DIAGNOSIS — E785 Hyperlipidemia, unspecified: Secondary | ICD-10-CM | POA: Diagnosis not present

## 2024-10-09 DIAGNOSIS — I69328 Other speech and language deficits following cerebral infarction: Secondary | ICD-10-CM | POA: Diagnosis not present

## 2024-10-09 DIAGNOSIS — I10 Essential (primary) hypertension: Secondary | ICD-10-CM | POA: Diagnosis not present

## 2024-10-09 DIAGNOSIS — G4752 REM sleep behavior disorder: Secondary | ICD-10-CM | POA: Diagnosis not present

## 2024-10-10 ENCOUNTER — Other Ambulatory Visit: Payer: Self-pay | Admitting: Family Medicine

## 2024-10-10 ENCOUNTER — Other Ambulatory Visit: Payer: Self-pay | Admitting: Internal Medicine

## 2024-10-10 ENCOUNTER — Other Ambulatory Visit: Payer: Self-pay

## 2024-10-10 DIAGNOSIS — E7800 Pure hypercholesterolemia, unspecified: Secondary | ICD-10-CM

## 2024-10-10 DIAGNOSIS — J301 Allergic rhinitis due to pollen: Secondary | ICD-10-CM

## 2024-10-10 DIAGNOSIS — F419 Anxiety disorder, unspecified: Secondary | ICD-10-CM

## 2024-10-10 MED ORDER — CETIRIZINE HCL 10 MG PO TABS
10.0000 mg | ORAL_TABLET | Freq: Every day | ORAL | 1 refills | Status: AC
  Filled 2024-10-24 – 2024-10-31 (×3): qty 30, 30d supply, fill #0
  Filled 2024-11-24: qty 30, 30d supply, fill #1

## 2024-10-11 ENCOUNTER — Other Ambulatory Visit: Payer: Self-pay

## 2024-10-12 NOTE — Telephone Encounter (Signed)
 Meds were dispensed on 09/28/24.

## 2024-10-13 ENCOUNTER — Other Ambulatory Visit (HOSPITAL_COMMUNITY): Payer: Self-pay

## 2024-10-13 ENCOUNTER — Encounter (HOSPITAL_COMMUNITY): Payer: Self-pay

## 2024-10-17 DIAGNOSIS — E785 Hyperlipidemia, unspecified: Secondary | ICD-10-CM | POA: Diagnosis not present

## 2024-10-17 DIAGNOSIS — G20A1 Parkinson's disease without dyskinesia, without mention of fluctuations: Secondary | ICD-10-CM | POA: Diagnosis not present

## 2024-10-17 DIAGNOSIS — M810 Age-related osteoporosis without current pathological fracture: Secondary | ICD-10-CM | POA: Diagnosis not present

## 2024-10-17 DIAGNOSIS — J4489 Other specified chronic obstructive pulmonary disease: Secondary | ICD-10-CM | POA: Diagnosis not present

## 2024-10-17 DIAGNOSIS — G4752 REM sleep behavior disorder: Secondary | ICD-10-CM | POA: Diagnosis not present

## 2024-10-17 DIAGNOSIS — I69351 Hemiplegia and hemiparesis following cerebral infarction affecting right dominant side: Secondary | ICD-10-CM | POA: Diagnosis not present

## 2024-10-17 DIAGNOSIS — I2510 Atherosclerotic heart disease of native coronary artery without angina pectoris: Secondary | ICD-10-CM | POA: Diagnosis not present

## 2024-10-17 DIAGNOSIS — I69328 Other speech and language deficits following cerebral infarction: Secondary | ICD-10-CM | POA: Diagnosis not present

## 2024-10-17 DIAGNOSIS — I10 Essential (primary) hypertension: Secondary | ICD-10-CM | POA: Diagnosis not present

## 2024-10-24 ENCOUNTER — Other Ambulatory Visit (HOSPITAL_COMMUNITY): Payer: Self-pay

## 2024-10-24 ENCOUNTER — Encounter: Payer: Self-pay | Admitting: Internal Medicine

## 2024-10-24 ENCOUNTER — Other Ambulatory Visit: Payer: Self-pay

## 2024-10-25 ENCOUNTER — Other Ambulatory Visit: Payer: Self-pay

## 2024-10-25 ENCOUNTER — Other Ambulatory Visit (HOSPITAL_COMMUNITY): Payer: Self-pay

## 2024-10-26 ENCOUNTER — Other Ambulatory Visit: Payer: Self-pay

## 2024-10-26 ENCOUNTER — Other Ambulatory Visit (HOSPITAL_COMMUNITY): Payer: Self-pay

## 2024-10-27 ENCOUNTER — Other Ambulatory Visit: Payer: Self-pay

## 2024-10-27 ENCOUNTER — Telehealth: Payer: Self-pay | Admitting: Family Medicine

## 2024-10-27 ENCOUNTER — Encounter: Payer: Self-pay | Admitting: Internal Medicine

## 2024-10-27 ENCOUNTER — Other Ambulatory Visit (HOSPITAL_COMMUNITY): Payer: Self-pay

## 2024-10-27 NOTE — Telephone Encounter (Unsigned)
 Copied from CRM #[id number]. Topic: Clinical - Medication Refill >> Oct 27, 2024 11:14 AM Alfonso HERO wrote: Medication: atorvastatin  (LIPITOR) 40 MG tablet  citalopram  (CELEXA ) 20 MG tablet   Has the patient contacted their pharmacy? Yes (Agent: If no, request that the patient contact the pharmacy for the refill. If patient does not wish to contact the pharmacy document the reason why and proceed with request.) (Agent: If yes, when and what did the pharmacy advise?)  This is the patient's preferred pharmacy:  [facility] [address] [place] [address] Phone: [phone] Fax: [phone]   Is this the correct pharmacy for this prescription? Yes If no, delete pharmacy and type the correct one.   Has the prescription been filled recently? Yes  Is the patient out of the medication? Yes  Has the patient been seen for an appointment in the last year OR does the patient have an upcoming appointment? Yes  Can we respond through MyChart? Yes  Agent: Please be advised that Rx refills may take up to 3 business days. We ask that you follow-up with your pharmacy.

## 2024-10-29 ENCOUNTER — Other Ambulatory Visit: Payer: Self-pay

## 2024-10-30 ENCOUNTER — Encounter: Payer: Self-pay | Admitting: Family Medicine

## 2024-10-30 ENCOUNTER — Other Ambulatory Visit (HOSPITAL_COMMUNITY): Payer: Self-pay

## 2024-10-30 ENCOUNTER — Ambulatory Visit: Admitting: Family Medicine

## 2024-10-30 ENCOUNTER — Other Ambulatory Visit: Payer: Self-pay

## 2024-10-30 ENCOUNTER — Other Ambulatory Visit (HOSPITAL_BASED_OUTPATIENT_CLINIC_OR_DEPARTMENT_OTHER): Payer: Self-pay

## 2024-10-30 ENCOUNTER — Encounter: Payer: Self-pay | Admitting: Pharmacist

## 2024-10-30 VITALS — BP 102/66 | HR 83 | Ht 61.0 in | Wt 125.0 lb

## 2024-10-30 DIAGNOSIS — J45909 Unspecified asthma, uncomplicated: Secondary | ICD-10-CM

## 2024-10-30 DIAGNOSIS — F419 Anxiety disorder, unspecified: Secondary | ICD-10-CM

## 2024-10-30 DIAGNOSIS — M810 Age-related osteoporosis without current pathological fracture: Secondary | ICD-10-CM | POA: Diagnosis not present

## 2024-10-30 DIAGNOSIS — E7800 Pure hypercholesterolemia, unspecified: Secondary | ICD-10-CM | POA: Diagnosis not present

## 2024-10-30 DIAGNOSIS — I10 Essential (primary) hypertension: Secondary | ICD-10-CM

## 2024-10-30 DIAGNOSIS — E782 Mixed hyperlipidemia: Secondary | ICD-10-CM | POA: Diagnosis not present

## 2024-10-30 MED ORDER — CITALOPRAM HYDROBROMIDE 20 MG PO TABS
20.0000 mg | ORAL_TABLET | Freq: Every day | ORAL | 3 refills | Status: AC
  Filled 2024-10-30 – 2024-10-31 (×2): qty 30, 30d supply, fill #0
  Filled 2024-11-24: qty 30, 30d supply, fill #1

## 2024-10-30 MED ORDER — ATORVASTATIN CALCIUM 40 MG PO TABS
40.0000 mg | ORAL_TABLET | Freq: Every day | ORAL | 3 refills | Status: AC
  Filled 2024-10-30 – 2024-10-31 (×2): qty 30, 30d supply, fill #0
  Filled 2024-11-24: qty 30, 30d supply, fill #1

## 2024-10-30 MED ORDER — ALENDRONATE SODIUM 70 MG PO TABS
70.0000 mg | ORAL_TABLET | ORAL | 3 refills | Status: AC
  Filled 2024-10-30: qty 12, 84d supply, fill #0

## 2024-10-30 MED ORDER — MONTELUKAST SODIUM 10 MG PO TABS
10.0000 mg | ORAL_TABLET | Freq: Every morning | ORAL | 3 refills | Status: AC
  Filled 2024-10-30 – 2024-10-31 (×2): qty 30, 30d supply, fill #0
  Filled 2024-11-24: qty 30, 30d supply, fill #1

## 2024-10-30 NOTE — Assessment & Plan Note (Signed)
Well controlled Continue Celexa at current dose 

## 2024-10-30 NOTE — Telephone Encounter (Signed)
 Pt in office today. Orders pended to visit

## 2024-10-30 NOTE — Progress Notes (Signed)
 Established patient visit   Patient: Lori Brady   DOB: 03-24-1946   78 y.o. Female  MRN: [id number] Visit Date: 10/30/2024  Today's healthcare provider: Jon Eva, MD   Chief Complaint  Patient presents with   Medication Refill   Subjective    Medication Refill     Discussed the use of AI scribe software for clinical note transcription with the patient, who gave verbal consent to proceed.  History of Present Illness   Lori Brady is a 78 year old female who presents for a follow-up visit and medication refills.  She is followed by neurology for Parkinsonism and needs refills of Fosamax , citalopram  (Celexa ), atorvastatin , and montelukast  (Singulair ). She is not taking any antihypertensive medication.  She reports chronic right shoulder pain and is being followed by orthopedics for a planned shoulder replacement. Prior arm surgeries have healed. She receives home therapy.  She has a history of cancer and is due for a mammogram ordered by another provider.  She uses [facility], which mails her medications in packets.      Medications: Show/hide medication list[1]  Review of Systems     Objective    BP 102/66 (BP Location: Left Arm, Patient Position: Sitting, Cuff Size: Normal)   Pulse 83   Ht 5' 1 (1.549 m)   Wt 125 lb (56.7 kg)   SpO2 97%   BMI 23.62 kg/m    Physical Exam Vitals reviewed.  Constitutional:      General: She is not in acute distress.    Appearance: Normal appearance. She is well-developed. She is not diaphoretic.  HENT:     Head: Normocephalic and atraumatic.  Eyes:     General: No scleral icterus.    Conjunctiva/sclera: Conjunctivae normal.  Neck:     Thyroid: No thyromegaly.  Cardiovascular:     Rate and Rhythm: Normal rate and regular rhythm.     Heart sounds: Normal heart sounds. No murmur heard. Pulmonary:     Effort: Pulmonary effort is normal. No respiratory distress.     Breath sounds: Normal breath  sounds. No wheezing, rhonchi or rales.  Musculoskeletal:     Cervical back: Neck supple.     Right lower leg: No edema.     Left lower leg: No edema.  Lymphadenopathy:     Cervical: No cervical adenopathy.  Skin:    General: Skin is warm and dry.     Findings: No rash.  Neurological:     Mental Status: She is alert and oriented to person, place, and time. Mental status is at baseline.  Psychiatric:        Mood and Affect: Mood normal.        Behavior: Behavior normal.      No results found for any visits on 10/30/24.  Assessment & Plan     Problem List Items Addressed This Visit       Cardiovascular and Mediastinum   Benign essential HTN   Blood pressure is well-controlled without current antihypertensive medication.      Relevant Medications   atorvastatin  (LIPITOR) 40 MG tablet     Respiratory   Asthma without status asthmaticus   Continues montelukast  (Singulair ) for asthma management. - Refilled montelukast  (Singulair ) prescription.      Relevant Medications   montelukast  (SINGULAIR ) 10 MG tablet     Musculoskeletal and Integument   Osteoporosis   Requires ongoing management with Fosamax . - Refilled Fosamax  prescription.  Relevant Medications   alendronate  (FOSAMAX ) 70 MG tablet     Other   Anxiety   Well controlled Continue Celexa  at current dose      Relevant Medications   citalopram  (CELEXA ) 20 MG tablet   Mixed hyperlipidemia - Primary   On Lipitor 40 mg daily. Recent cholesterol levels were good. No immediate recheck needed. - Continue Lipitor 40 mg daily - Recheck cholesterol levels next year      Relevant Medications   atorvastatin  (LIPITOR) 40 MG tablet   Other Visit Diagnoses       Hypercholesterolemia       Relevant Medications   atorvastatin  (LIPITOR) 40 MG tablet           General Health Maintenance Due for a mammogram as ordered by Dr. Donzella. - Schedule mammogram as per Dr. Greggory order.        Return in  about 1 year (around 10/30/2025) for chronic disease f/u.       Jon Eva, MD  [facility] [phone] (phone) [phone] (fax)  [facility]     [1]  Outpatient Medications Prior to Visit  Medication Sig   albuterol  (VENTOLIN  HFA) 108 (90 Base) MCG/ACT inhaler Inhale 2 puffs into the lungs every 6 (six) hours as needed for wheezing or shortness of breath.   aspirin  81 MG EC tablet Take 81 mg by mouth daily with breakfast.   azelastine  (ASTELIN ) 0.1 % nasal spray Place 2 sprays into both nostrils daily at 6 (six) AM.   Budeson-Glycopyrrol-Formoterol  (BREZTRI  AEROSPHERE) 160-9-4.8 MCG/ACT AERO Inhale 2 puffs into the lungs in the morning and at bedtime. Getting from [facility]   carbidopa -levodopa  (SINEMET  CR) 50-200 MG tablet Take 1 tablet by mouth at bedtime.   carbidopa -levodopa  (SINEMET  CR) 50-200 MG tablet Take 1 tablet by mouth at bedtime   carbidopa -levodopa  (SINEMET  CR) 50-200 MG tablet Take 2 tablets by mouth at bedtime   carbidopa -levodopa  (SINEMET  IR) 25-100 MG tablet Take 3 tablets with breakfast, 2 tablets with lunch, 2 tablets with supper.   carbidopa -levodopa  (SINEMET  IR) 25-100 MG tablet Take 3 tablets by mouth every morning AND 2 tablets by mouth 2 (two) times daily.   celecoxib  (CELEBREX ) 100 MG capsule Take 1 capsule (100 mg total) by mouth 2 (two) times daily.   cetirizine  (ZYRTEC ) 10 MG tablet Take 1 tablet (10 mg total) by mouth daily.   Cholecalciferol  (VITAMIN D ) 2000 UNITS CAPS Take 1 capsule by mouth daily.   ferrous sulfate 325 (65 FE) MG EC tablet Take 650 mg by mouth daily with breakfast. (Patient taking differently: Take 650 mg by mouth daily. Taking 2 qam)   ipratropium-albuterol  (DUONEB) 0.5-2.5 (3) MG/3ML SOLN Take 3 mLs by nebulization every 4 (four) hours as needed.   melatonin 5 MG TABS Take 1 tablet by mouth at bedtime.   omeprazole  (PRILOSEC) 40 MG capsule Take 1 capsule (40 mg total) by mouth in the  morning and at bedtime.   Probiotic Product (PROBIOTIC DAILY PO) Take 1 tablet by mouth daily.   sodium fluoride  (PREVIDENT 5000 DRY MOUTH) 1.1 % GEL dental gel Use as directed   [DISCONTINUED] alendronate  (FOSAMAX ) 70 MG tablet Take 1 tablet (70 mg total) by mouth once a week -take on an empty stomach before breakfast with 8 oz of water. Remaine upright for 30 minutes.   [DISCONTINUED] atorvastatin  (LIPITOR) 40 MG tablet Take 1 tablet (40 mg total) by mouth daily.   [DISCONTINUED] citalopram  (CELEXA ) 20 MG tablet  Take 1 tablet (20 mg total) by mouth daily.   [DISCONTINUED] montelukast  (SINGULAIR ) 10 MG tablet Take 1 tablet (10 mg total) by mouth every morning.   No facility-administered medications prior to visit.

## 2024-10-30 NOTE — Assessment & Plan Note (Signed)
 Blood pressure is well-controlled without current antihypertensive medication.

## 2024-10-30 NOTE — Patient Instructions (Signed)

## 2024-10-30 NOTE — Assessment & Plan Note (Signed)
 Continues montelukast  (Singulair ) for asthma management. - Refilled montelukast  (Singulair ) prescription.

## 2024-10-30 NOTE — Assessment & Plan Note (Signed)
 Requires ongoing management with Fosamax . - Refilled Fosamax  prescription.

## 2024-10-30 NOTE — Telephone Encounter (Signed)
Ok to refill per protocol.

## 2024-10-30 NOTE — Assessment & Plan Note (Signed)
 On Lipitor 40 mg daily. Recent cholesterol levels were good. No immediate recheck needed. - Continue Lipitor 40 mg daily - Recheck cholesterol levels next year

## 2024-10-31 ENCOUNTER — Other Ambulatory Visit: Payer: Self-pay

## 2024-10-31 ENCOUNTER — Encounter: Payer: Self-pay | Admitting: Internal Medicine

## 2024-10-31 ENCOUNTER — Other Ambulatory Visit (HOSPITAL_COMMUNITY): Payer: Self-pay

## 2024-11-17 ENCOUNTER — Other Ambulatory Visit: Payer: Self-pay

## 2024-11-17 ENCOUNTER — Encounter: Payer: Self-pay | Admitting: Internal Medicine

## 2024-11-24 ENCOUNTER — Other Ambulatory Visit: Payer: Self-pay

## 2024-11-24 ENCOUNTER — Other Ambulatory Visit (HOSPITAL_COMMUNITY): Payer: Self-pay

## 2024-11-24 ENCOUNTER — Encounter: Payer: Self-pay | Admitting: Internal Medicine

## 2024-11-27 ENCOUNTER — Other Ambulatory Visit: Payer: Self-pay

## 2024-11-30 ENCOUNTER — Other Ambulatory Visit (HOSPITAL_COMMUNITY): Payer: Self-pay

## 2024-11-30 ENCOUNTER — Other Ambulatory Visit: Payer: Self-pay

## 2024-12-08 ENCOUNTER — Other Ambulatory Visit (HOSPITAL_COMMUNITY): Payer: Self-pay

## 2024-12-20 ENCOUNTER — Other Ambulatory Visit (HOSPITAL_COMMUNITY): Payer: Self-pay

## 2025-02-21 ENCOUNTER — Ambulatory Visit

## 2025-04-11 ENCOUNTER — Encounter: Admitting: Internal Medicine

## 2025-04-24 ENCOUNTER — Ambulatory Visit: Admitting: Internal Medicine

## 2025-08-14 ENCOUNTER — Encounter (INDEPENDENT_AMBULATORY_CARE_PROVIDER_SITE_OTHER)

## 2025-08-14 ENCOUNTER — Ambulatory Visit (INDEPENDENT_AMBULATORY_CARE_PROVIDER_SITE_OTHER): Admitting: Vascular Surgery

## 2025-10-30 ENCOUNTER — Ambulatory Visit: Admitting: Family Medicine
